# Patient Record
Sex: Female | Born: 1945 | Race: White | Hispanic: No | Marital: Married | Smoking: Current every day smoker
Health system: Southern US, Community
[De-identification: ages and names within clinical notes are randomized; demographics above are authoritative.]

## PROBLEM LIST (undated history)

## (undated) DIAGNOSIS — R569 Unspecified convulsions: Secondary | ICD-10-CM

## (undated) DIAGNOSIS — M797 Fibromyalgia: Secondary | ICD-10-CM

## (undated) DIAGNOSIS — E119 Type 2 diabetes mellitus without complications: Secondary | ICD-10-CM

## (undated) DIAGNOSIS — R413 Other amnesia: Secondary | ICD-10-CM

## (undated) DIAGNOSIS — F32A Depression, unspecified: Secondary | ICD-10-CM

## (undated) DIAGNOSIS — K861 Other chronic pancreatitis: Secondary | ICD-10-CM

## (undated) DIAGNOSIS — Z853 Personal history of malignant neoplasm of breast: Secondary | ICD-10-CM

## (undated) DIAGNOSIS — J449 Chronic obstructive pulmonary disease, unspecified: Secondary | ICD-10-CM

## (undated) DIAGNOSIS — R5382 Chronic fatigue, unspecified: Secondary | ICD-10-CM

## (undated) DIAGNOSIS — K219 Gastro-esophageal reflux disease without esophagitis: Secondary | ICD-10-CM

## (undated) DIAGNOSIS — E162 Hypoglycemia, unspecified: Secondary | ICD-10-CM

## (undated) DIAGNOSIS — G20 Parkinson's disease: Secondary | ICD-10-CM

## (undated) DIAGNOSIS — N189 Chronic kidney disease, unspecified: Secondary | ICD-10-CM

## (undated) DIAGNOSIS — E46 Unspecified protein-calorie malnutrition: Secondary | ICD-10-CM

## (undated) DIAGNOSIS — C801 Malignant (primary) neoplasm, unspecified: Secondary | ICD-10-CM

## (undated) DIAGNOSIS — F1120 Opioid dependence, uncomplicated: Secondary | ICD-10-CM

## (undated) DIAGNOSIS — S63259A Unspecified dislocation of unspecified finger, initial encounter: Secondary | ICD-10-CM

## (undated) DIAGNOSIS — G20A1 Parkinson's disease without dyskinesia, without mention of fluctuations: Secondary | ICD-10-CM

## (undated) DIAGNOSIS — K2270 Barrett's esophagus without dysplasia: Secondary | ICD-10-CM

## (undated) DIAGNOSIS — R269 Unspecified abnormalities of gait and mobility: Secondary | ICD-10-CM

## (undated) DIAGNOSIS — K5790 Diverticulosis of intestine, part unspecified, without perforation or abscess without bleeding: Secondary | ICD-10-CM

## (undated) DIAGNOSIS — D649 Anemia, unspecified: Secondary | ICD-10-CM

## (undated) DIAGNOSIS — G43909 Migraine, unspecified, not intractable, without status migrainosus: Secondary | ICD-10-CM

## (undated) DIAGNOSIS — F419 Anxiety disorder, unspecified: Secondary | ICD-10-CM

## (undated) DIAGNOSIS — S2220XA Unspecified fracture of sternum, initial encounter for closed fracture: Secondary | ICD-10-CM

## (undated) DIAGNOSIS — G8929 Other chronic pain: Secondary | ICD-10-CM

## (undated) DIAGNOSIS — F329 Major depressive disorder, single episode, unspecified: Secondary | ICD-10-CM

## (undated) DIAGNOSIS — I10 Essential (primary) hypertension: Secondary | ICD-10-CM

## (undated) DIAGNOSIS — R918 Other nonspecific abnormal finding of lung field: Secondary | ICD-10-CM

## (undated) DIAGNOSIS — K859 Acute pancreatitis without necrosis or infection, unspecified: Secondary | ICD-10-CM

## (undated) HISTORY — DX: Unspecified protein-calorie malnutrition: E46

## (undated) HISTORY — PX: APPENDECTOMY: SHX54

## (undated) HISTORY — DX: Diverticulosis of intestine, part unspecified, without perforation or abscess without bleeding: K57.90

## (undated) HISTORY — DX: Other amnesia: R41.3

## (undated) HISTORY — DX: Acute pancreatitis without necrosis or infection, unspecified: K85.90

## (undated) HISTORY — PX: COLONOSCOPY: SHX174

## (undated) HISTORY — DX: Chronic obstructive pulmonary disease, unspecified: J44.9

## (undated) HISTORY — DX: Chronic kidney disease, unspecified: N18.9

## (undated) HISTORY — DX: Parkinson's disease: G20

## (undated) HISTORY — PX: NASAL SINUS SURGERY: SHX719

## (undated) HISTORY — PX: CATARACT EXTRACTION: SUR2

## (undated) HISTORY — DX: Other nonspecific abnormal finding of lung field: R91.8

## (undated) HISTORY — DX: Other chronic pain: G89.29

## (undated) HISTORY — DX: Fibromyalgia: M79.7

## (undated) HISTORY — DX: Unspecified convulsions: R56.9

## (undated) HISTORY — DX: Depression, unspecified: F32.A

## (undated) HISTORY — PX: UPPER GASTROINTESTINAL ENDOSCOPY: SHX188

## (undated) HISTORY — DX: Gastro-esophageal reflux disease without esophagitis: K21.9

## (undated) HISTORY — PX: BOTOX INJECTION: SHX5754

## (undated) HISTORY — PX: EYE SURGERY: SHX253

## (undated) HISTORY — DX: Barrett's esophagus without dysplasia: K22.70

## (undated) HISTORY — DX: Unspecified abnormalities of gait and mobility: R26.9

## (undated) HISTORY — DX: Essential (primary) hypertension: I10

## (undated) HISTORY — DX: Anemia, unspecified: D64.9

## (undated) HISTORY — DX: Unspecified dislocation of unspecified finger, initial encounter: S63.259A

## (undated) HISTORY — DX: Anxiety disorder, unspecified: F41.9

## (undated) HISTORY — DX: Other chronic pancreatitis: K86.1

## (undated) HISTORY — PX: ABDOMINAL HYSTERECTOMY: SHX81

## (undated) HISTORY — DX: Parkinson's disease without dyskinesia, without mention of fluctuations: G20.A1

## (undated) HISTORY — DX: Migraine, unspecified, not intractable, without status migrainosus: G43.909

## (undated) HISTORY — DX: Major depressive disorder, single episode, unspecified: F32.9

## (undated) HISTORY — PX: CHOLECYSTECTOMY: SHX55

## (undated) HISTORY — DX: Chronic fatigue, unspecified: R53.82

## (undated) HISTORY — DX: Hypoglycemia, unspecified: E16.2

## (undated) HISTORY — DX: Personal history of malignant neoplasm of breast: Z85.3

## (undated) HISTORY — PX: PANCREATECTOMY: SHX1019

## (undated) HISTORY — PX: BREAST LUMPECTOMY: SHX2

## (undated) HISTORY — PX: CATARACT EXTRACTION, BILATERAL: SHX1313

## (undated) HISTORY — DX: Unspecified fracture of sternum, initial encounter for closed fracture: S22.20XA

## (undated) HISTORY — DX: Type 2 diabetes mellitus without complications: E11.9

---

## 1996-08-26 ENCOUNTER — Encounter (INDEPENDENT_AMBULATORY_CARE_PROVIDER_SITE_OTHER): Payer: Self-pay | Admitting: *Deleted

## 1997-04-15 ENCOUNTER — Ambulatory Visit: Admission: RE | Admit: 1997-04-15 | Discharge: 1997-04-15 | Payer: Self-pay | Admitting: Gastroenterology

## 1997-05-04 ENCOUNTER — Ambulatory Visit: Admission: RE | Admit: 1997-05-04 | Discharge: 1997-05-04 | Payer: Self-pay | Admitting: Gastroenterology

## 1997-07-21 ENCOUNTER — Ambulatory Visit: Admission: RE | Admit: 1997-07-21 | Discharge: 1997-07-21 | Payer: Self-pay | Admitting: Gastroenterology

## 1997-08-26 ENCOUNTER — Inpatient Hospital Stay (HOSPITAL_COMMUNITY): Admission: EM | Admit: 1997-08-26 | Discharge: 1997-08-31 | Payer: Self-pay | Admitting: Gastroenterology

## 1997-10-19 ENCOUNTER — Emergency Department (HOSPITAL_COMMUNITY): Admission: EM | Admit: 1997-10-19 | Discharge: 1997-10-19 | Payer: Self-pay | Admitting: Emergency Medicine

## 1999-03-02 ENCOUNTER — Ambulatory Visit (HOSPITAL_COMMUNITY): Admission: RE | Admit: 1999-03-02 | Discharge: 1999-03-02 | Payer: Self-pay | Admitting: Internal Medicine

## 1999-03-02 ENCOUNTER — Encounter: Payer: Self-pay | Admitting: Internal Medicine

## 1999-12-15 ENCOUNTER — Encounter: Payer: Self-pay | Admitting: Internal Medicine

## 1999-12-15 ENCOUNTER — Ambulatory Visit (HOSPITAL_COMMUNITY): Admission: RE | Admit: 1999-12-15 | Discharge: 1999-12-15 | Payer: Self-pay | Admitting: Internal Medicine

## 2002-03-19 ENCOUNTER — Ambulatory Visit (HOSPITAL_COMMUNITY): Admission: RE | Admit: 2002-03-19 | Discharge: 2002-03-19 | Payer: Self-pay | Admitting: Internal Medicine

## 2002-03-19 ENCOUNTER — Encounter: Payer: Self-pay | Admitting: Internal Medicine

## 2003-03-13 HISTORY — PX: LOBECTOMY: SHX5089

## 2003-04-12 ENCOUNTER — Ambulatory Visit (HOSPITAL_COMMUNITY): Admission: RE | Admit: 2003-04-12 | Discharge: 2003-04-12 | Payer: Self-pay | Admitting: Internal Medicine

## 2003-05-28 ENCOUNTER — Encounter: Admission: RE | Admit: 2003-05-28 | Discharge: 2003-05-28 | Payer: Self-pay | Admitting: Internal Medicine

## 2003-06-18 ENCOUNTER — Ambulatory Visit (HOSPITAL_COMMUNITY): Admission: RE | Admit: 2003-06-18 | Discharge: 2003-06-18 | Payer: Self-pay | Admitting: Thoracic Surgery

## 2003-06-29 ENCOUNTER — Encounter (INDEPENDENT_AMBULATORY_CARE_PROVIDER_SITE_OTHER): Payer: Self-pay | Admitting: *Deleted

## 2003-06-29 ENCOUNTER — Inpatient Hospital Stay (HOSPITAL_COMMUNITY): Admission: RE | Admit: 2003-06-29 | Discharge: 2003-07-04 | Payer: Self-pay | Admitting: Thoracic Surgery

## 2003-07-07 ENCOUNTER — Encounter: Admission: RE | Admit: 2003-07-07 | Discharge: 2003-07-07 | Payer: Self-pay | Admitting: Thoracic Surgery

## 2003-07-08 ENCOUNTER — Encounter: Admission: RE | Admit: 2003-07-08 | Discharge: 2003-07-08 | Payer: Self-pay | Admitting: Thoracic Surgery

## 2003-07-13 ENCOUNTER — Encounter: Admission: RE | Admit: 2003-07-13 | Discharge: 2003-07-13 | Payer: Self-pay | Admitting: Thoracic Surgery

## 2003-07-19 ENCOUNTER — Emergency Department (HOSPITAL_COMMUNITY): Admission: EM | Admit: 2003-07-19 | Discharge: 2003-07-19 | Payer: Self-pay | Admitting: Emergency Medicine

## 2003-07-22 ENCOUNTER — Encounter: Admission: RE | Admit: 2003-07-22 | Discharge: 2003-07-22 | Payer: Self-pay | Admitting: Thoracic Surgery

## 2003-07-27 ENCOUNTER — Encounter: Admission: RE | Admit: 2003-07-27 | Discharge: 2003-07-27 | Payer: Self-pay | Admitting: Thoracic Surgery

## 2003-07-28 ENCOUNTER — Encounter: Admission: RE | Admit: 2003-07-28 | Discharge: 2003-07-28 | Payer: Self-pay | Admitting: Thoracic Surgery

## 2003-07-29 ENCOUNTER — Inpatient Hospital Stay (HOSPITAL_COMMUNITY): Admission: AD | Admit: 2003-07-29 | Discharge: 2003-08-03 | Payer: Self-pay | Admitting: Thoracic Surgery

## 2003-08-11 ENCOUNTER — Encounter: Admission: RE | Admit: 2003-08-11 | Discharge: 2003-08-11 | Payer: Self-pay | Admitting: Thoracic Surgery

## 2003-08-18 ENCOUNTER — Encounter: Admission: RE | Admit: 2003-08-18 | Discharge: 2003-08-18 | Payer: Self-pay | Admitting: Thoracic Surgery

## 2003-09-29 ENCOUNTER — Encounter: Admission: RE | Admit: 2003-09-29 | Discharge: 2003-09-29 | Payer: Self-pay | Admitting: Thoracic Surgery

## 2003-10-13 ENCOUNTER — Ambulatory Visit (HOSPITAL_COMMUNITY): Admission: RE | Admit: 2003-10-13 | Discharge: 2003-10-13 | Payer: Self-pay | Admitting: Gastroenterology

## 2003-10-14 ENCOUNTER — Inpatient Hospital Stay (HOSPITAL_COMMUNITY): Admission: EM | Admit: 2003-10-14 | Discharge: 2003-10-20 | Payer: Self-pay | Admitting: Internal Medicine

## 2003-10-18 ENCOUNTER — Encounter (INDEPENDENT_AMBULATORY_CARE_PROVIDER_SITE_OTHER): Payer: Self-pay | Admitting: *Deleted

## 2003-10-18 ENCOUNTER — Encounter: Payer: Self-pay | Admitting: Gastroenterology

## 2003-10-27 ENCOUNTER — Encounter: Admission: RE | Admit: 2003-10-27 | Discharge: 2003-10-27 | Payer: Self-pay | Admitting: Thoracic Surgery

## 2003-11-03 ENCOUNTER — Encounter: Admission: RE | Admit: 2003-11-03 | Discharge: 2003-11-03 | Payer: Self-pay | Admitting: Thoracic Surgery

## 2003-11-10 ENCOUNTER — Encounter: Admission: RE | Admit: 2003-11-10 | Discharge: 2003-11-10 | Payer: Self-pay | Admitting: Thoracic Surgery

## 2003-11-30 ENCOUNTER — Emergency Department (HOSPITAL_COMMUNITY): Admission: EM | Admit: 2003-11-30 | Discharge: 2003-11-30 | Payer: Self-pay | Admitting: Emergency Medicine

## 2003-12-02 ENCOUNTER — Observation Stay (HOSPITAL_COMMUNITY): Admission: RE | Admit: 2003-12-02 | Discharge: 2003-12-03 | Payer: Self-pay | Admitting: General Surgery

## 2003-12-02 ENCOUNTER — Encounter (INDEPENDENT_AMBULATORY_CARE_PROVIDER_SITE_OTHER): Payer: Self-pay | Admitting: Specialist

## 2003-12-08 ENCOUNTER — Encounter: Admission: RE | Admit: 2003-12-08 | Discharge: 2003-12-08 | Payer: Self-pay | Admitting: Thoracic Surgery

## 2004-01-19 ENCOUNTER — Encounter: Admission: RE | Admit: 2004-01-19 | Discharge: 2004-01-19 | Payer: Self-pay | Admitting: Thoracic Surgery

## 2004-02-08 ENCOUNTER — Ambulatory Visit: Payer: Self-pay | Admitting: Internal Medicine

## 2004-03-07 ENCOUNTER — Ambulatory Visit: Payer: Self-pay | Admitting: Internal Medicine

## 2004-03-09 ENCOUNTER — Ambulatory Visit: Payer: Self-pay | Admitting: Internal Medicine

## 2004-04-04 ENCOUNTER — Ambulatory Visit: Payer: Self-pay | Admitting: Internal Medicine

## 2004-04-26 ENCOUNTER — Encounter: Admission: RE | Admit: 2004-04-26 | Discharge: 2004-04-26 | Payer: Self-pay | Admitting: Thoracic Surgery

## 2004-04-26 ENCOUNTER — Ambulatory Visit: Payer: Self-pay | Admitting: Internal Medicine

## 2004-05-02 ENCOUNTER — Ambulatory Visit: Payer: Self-pay | Admitting: Internal Medicine

## 2004-05-29 ENCOUNTER — Ambulatory Visit: Payer: Self-pay | Admitting: Internal Medicine

## 2004-06-13 ENCOUNTER — Ambulatory Visit: Payer: Self-pay | Admitting: Internal Medicine

## 2004-06-26 ENCOUNTER — Ambulatory Visit: Payer: Self-pay | Admitting: Internal Medicine

## 2004-07-11 ENCOUNTER — Ambulatory Visit: Payer: Self-pay | Admitting: Gastroenterology

## 2004-07-17 ENCOUNTER — Ambulatory Visit: Payer: Self-pay | Admitting: Internal Medicine

## 2004-07-20 ENCOUNTER — Ambulatory Visit: Payer: Self-pay | Admitting: Internal Medicine

## 2004-07-24 ENCOUNTER — Ambulatory Visit: Payer: Self-pay | Admitting: Internal Medicine

## 2004-07-27 ENCOUNTER — Ambulatory Visit: Payer: Self-pay | Admitting: Gastroenterology

## 2004-07-27 ENCOUNTER — Encounter (INDEPENDENT_AMBULATORY_CARE_PROVIDER_SITE_OTHER): Payer: Self-pay | Admitting: *Deleted

## 2004-07-27 LAB — HM COLONOSCOPY

## 2004-08-14 ENCOUNTER — Ambulatory Visit: Payer: Self-pay | Admitting: Internal Medicine

## 2004-09-13 ENCOUNTER — Ambulatory Visit: Payer: Self-pay | Admitting: Internal Medicine

## 2004-10-03 ENCOUNTER — Ambulatory Visit: Payer: Self-pay | Admitting: Internal Medicine

## 2004-10-31 ENCOUNTER — Ambulatory Visit: Payer: Self-pay | Admitting: Internal Medicine

## 2004-12-01 ENCOUNTER — Ambulatory Visit: Payer: Self-pay | Admitting: Internal Medicine

## 2004-12-29 ENCOUNTER — Ambulatory Visit: Payer: Self-pay | Admitting: Internal Medicine

## 2005-01-18 ENCOUNTER — Ambulatory Visit: Payer: Self-pay | Admitting: Internal Medicine

## 2005-01-29 ENCOUNTER — Ambulatory Visit: Payer: Self-pay | Admitting: Internal Medicine

## 2005-02-28 ENCOUNTER — Ambulatory Visit: Payer: Self-pay | Admitting: Internal Medicine

## 2005-04-02 ENCOUNTER — Ambulatory Visit: Payer: Self-pay | Admitting: Internal Medicine

## 2005-04-11 ENCOUNTER — Encounter (INDEPENDENT_AMBULATORY_CARE_PROVIDER_SITE_OTHER): Payer: Self-pay | Admitting: *Deleted

## 2005-04-11 ENCOUNTER — Encounter: Admission: RE | Admit: 2005-04-11 | Discharge: 2005-04-11 | Payer: Self-pay | Admitting: Obstetrics & Gynecology

## 2005-04-11 ENCOUNTER — Encounter (INDEPENDENT_AMBULATORY_CARE_PROVIDER_SITE_OTHER): Payer: Self-pay | Admitting: Radiology

## 2005-04-17 ENCOUNTER — Ambulatory Visit: Payer: Self-pay | Admitting: Internal Medicine

## 2005-04-17 ENCOUNTER — Ambulatory Visit: Payer: Self-pay | Admitting: Oncology

## 2005-04-22 ENCOUNTER — Encounter: Admission: RE | Admit: 2005-04-22 | Discharge: 2005-04-22 | Payer: Self-pay | Admitting: General Surgery

## 2005-04-30 ENCOUNTER — Encounter: Admission: RE | Admit: 2005-04-30 | Discharge: 2005-04-30 | Payer: Self-pay | Admitting: General Surgery

## 2005-04-30 ENCOUNTER — Ambulatory Visit: Admission: RE | Admit: 2005-04-30 | Discharge: 2005-05-25 | Payer: Self-pay | Admitting: Radiation Oncology

## 2005-05-03 ENCOUNTER — Ambulatory Visit: Payer: Self-pay | Admitting: Internal Medicine

## 2005-05-04 ENCOUNTER — Encounter (INDEPENDENT_AMBULATORY_CARE_PROVIDER_SITE_OTHER): Payer: Self-pay | Admitting: *Deleted

## 2005-05-04 ENCOUNTER — Encounter: Admission: RE | Admit: 2005-05-04 | Discharge: 2005-05-04 | Payer: Self-pay | Admitting: General Surgery

## 2005-05-09 ENCOUNTER — Ambulatory Visit (HOSPITAL_BASED_OUTPATIENT_CLINIC_OR_DEPARTMENT_OTHER): Admission: RE | Admit: 2005-05-09 | Discharge: 2005-05-09 | Payer: Self-pay | Admitting: General Surgery

## 2005-05-09 ENCOUNTER — Encounter: Admission: RE | Admit: 2005-05-09 | Discharge: 2005-05-09 | Payer: Self-pay | Admitting: General Surgery

## 2005-05-09 ENCOUNTER — Encounter (INDEPENDENT_AMBULATORY_CARE_PROVIDER_SITE_OTHER): Payer: Self-pay | Admitting: Specialist

## 2005-05-31 ENCOUNTER — Ambulatory Visit: Payer: Self-pay | Admitting: Internal Medicine

## 2005-06-06 ENCOUNTER — Ambulatory Visit: Payer: Self-pay | Admitting: Oncology

## 2005-06-12 ENCOUNTER — Ambulatory Visit (HOSPITAL_COMMUNITY): Admission: RE | Admit: 2005-06-12 | Discharge: 2005-06-12 | Payer: Self-pay | Admitting: Oncology

## 2005-06-15 ENCOUNTER — Ambulatory Visit: Admission: RE | Admit: 2005-06-15 | Discharge: 2005-09-05 | Payer: Self-pay | Admitting: Radiation Oncology

## 2005-06-18 ENCOUNTER — Encounter: Admission: RE | Admit: 2005-06-18 | Discharge: 2005-06-18 | Payer: Self-pay | Admitting: Oncology

## 2005-07-02 ENCOUNTER — Ambulatory Visit: Payer: Self-pay | Admitting: Internal Medicine

## 2005-07-04 ENCOUNTER — Ambulatory Visit: Payer: Self-pay

## 2005-07-23 ENCOUNTER — Ambulatory Visit: Payer: Self-pay | Admitting: Oncology

## 2005-08-01 ENCOUNTER — Ambulatory Visit: Payer: Self-pay | Admitting: Internal Medicine

## 2005-08-28 ENCOUNTER — Ambulatory Visit: Payer: Self-pay | Admitting: Internal Medicine

## 2005-09-27 ENCOUNTER — Ambulatory Visit: Payer: Self-pay | Admitting: Internal Medicine

## 2005-10-16 ENCOUNTER — Ambulatory Visit: Payer: Self-pay | Admitting: Internal Medicine

## 2005-10-16 ENCOUNTER — Ambulatory Visit (HOSPITAL_COMMUNITY): Admission: RE | Admit: 2005-10-16 | Discharge: 2005-10-16 | Payer: Self-pay | Admitting: Internal Medicine

## 2005-10-23 ENCOUNTER — Ambulatory Visit: Payer: Self-pay | Admitting: Internal Medicine

## 2005-11-28 ENCOUNTER — Ambulatory Visit: Payer: Self-pay | Admitting: Internal Medicine

## 2005-12-10 DIAGNOSIS — Z853 Personal history of malignant neoplasm of breast: Secondary | ICD-10-CM

## 2005-12-31 ENCOUNTER — Ambulatory Visit: Payer: Self-pay | Admitting: Internal Medicine

## 2006-01-14 ENCOUNTER — Ambulatory Visit: Payer: Self-pay | Admitting: Internal Medicine

## 2006-01-15 ENCOUNTER — Ambulatory Visit: Payer: Self-pay | Admitting: Oncology

## 2006-01-29 ENCOUNTER — Ambulatory Visit: Payer: Self-pay | Admitting: Internal Medicine

## 2006-02-05 LAB — CBC WITH DIFFERENTIAL/PLATELET
Basophils Absolute: 0 10*3/uL (ref 0.0–0.1)
Eosinophils Absolute: 0.1 10*3/uL (ref 0.0–0.5)
HCT: 32.2 % — ABNORMAL LOW (ref 34.8–46.6)
HGB: 10.6 g/dL — ABNORMAL LOW (ref 11.6–15.9)
MCV: 84.9 fL (ref 81.0–101.0)
MONO%: 5.7 % (ref 0.0–13.0)
NEUT#: 6.6 10*3/uL — ABNORMAL HIGH (ref 1.5–6.5)
NEUT%: 76.7 % (ref 39.6–76.8)
RDW: 18.2 % — ABNORMAL HIGH (ref 11.3–14.5)
lymph#: 1.4 10*3/uL (ref 0.9–3.3)

## 2006-02-05 LAB — COMPREHENSIVE METABOLIC PANEL
Albumin: 3.8 g/dL (ref 3.5–5.2)
BUN: 8 mg/dL (ref 6–23)
Calcium: 9.3 mg/dL (ref 8.4–10.5)
Chloride: 102 mEq/L (ref 96–112)
Glucose, Bld: 150 mg/dL — ABNORMAL HIGH (ref 70–99)
Potassium: 4.5 mEq/L (ref 3.5–5.3)

## 2006-02-26 ENCOUNTER — Ambulatory Visit: Payer: Self-pay | Admitting: Internal Medicine

## 2006-03-22 ENCOUNTER — Ambulatory Visit: Payer: Self-pay | Admitting: Internal Medicine

## 2006-04-04 ENCOUNTER — Ambulatory Visit: Payer: Self-pay | Admitting: Oncology

## 2006-04-05 ENCOUNTER — Ambulatory Visit: Payer: Self-pay | Admitting: Internal Medicine

## 2006-04-08 LAB — FOLLICLE STIMULATING HORMONE: FSH: 59.7 m[IU]/mL

## 2006-04-19 ENCOUNTER — Ambulatory Visit: Payer: Self-pay | Admitting: Internal Medicine

## 2006-04-22 LAB — ESTRADIOL, ULTRA SENS: Estradiol, Ultra Sensitive: 5 pg/mL

## 2006-04-26 ENCOUNTER — Encounter: Admission: RE | Admit: 2006-04-26 | Discharge: 2006-04-26 | Payer: Self-pay | Admitting: Oncology

## 2006-05-07 ENCOUNTER — Ambulatory Visit: Payer: Self-pay | Admitting: Internal Medicine

## 2006-05-20 ENCOUNTER — Ambulatory Visit: Payer: Self-pay | Admitting: Internal Medicine

## 2006-05-20 LAB — CONVERTED CEMR LAB
AST: 45 units/L — ABNORMAL HIGH (ref 0–37)
Albumin: 3.3 g/dL — ABNORMAL LOW (ref 3.5–5.2)
Alkaline Phosphatase: 88 units/L (ref 39–117)
BUN: 8 mg/dL (ref 6–23)
Basophils Absolute: 0.1 10*3/uL (ref 0.0–0.1)
Basophils Absolute: 0.1 10*3/uL (ref 0.0–0.1)
Bilirubin Urine: NEGATIVE
Bilirubin Urine: NEGATIVE
Bilirubin, Direct: 0.1 mg/dL (ref 0.0–0.3)
CO2: 27 meq/L (ref 19–32)
Chloride: 103 meq/L (ref 96–112)
Cholesterol: 197 mg/dL (ref 0–200)
Cholesterol: 197 mg/dL (ref 0–200)
Creatinine, Ser: 0.7 mg/dL (ref 0.4–1.2)
Creatinine, Ser: 0.7 mg/dL (ref 0.4–1.2)
Crystals: NEGATIVE
Eosinophils Absolute: 0.1 10*3/uL (ref 0.0–0.6)
GFR calc Af Amer: 110 mL/min
GFR calc non Af Amer: 91 mL/min
Glucose, Bld: 143 mg/dL — ABNORMAL HIGH (ref 70–99)
HCT: 33.5 % — ABNORMAL LOW (ref 36.0–46.0)
HCT: 33.5 % — ABNORMAL LOW (ref 36.0–46.0)
HDL: 59.9 mg/dL (ref >39.0)
HDL: 59.9 mg/dL (ref >39.0)
Hemoglobin, Urine: NEGATIVE
Hemoglobin, Urine: NEGATIVE
Hemoglobin: 11.3 g/dL — ABNORMAL LOW (ref 12.0–15.0)
Hemoglobin: 11.3 g/dL — ABNORMAL LOW (ref 12.0–15.0)
Hgb A1c MFr Bld: 7.6 % — ABNORMAL HIGH (ref 4.6–6.0)
Hgb A1c MFr Bld: 7.6 % — ABNORMAL HIGH (ref 4.6–6.0)
Ketones, ur: NEGATIVE mg/dL
LDL Cholesterol: 99 mg/dL (ref 0–99)
LDL Cholesterol: 99 mg/dL (ref 0–99)
Lymphocytes Relative: 18 % (ref 12.0–46.0)
MCHC: 33.7 g/dL (ref 30.0–36.0)
MCHC: 33.7 g/dL (ref 30.0–36.0)
MCV: 83.6 fL (ref 78.0–100.0)
Monocytes Absolute: 0.4 10*3/uL (ref 0.2–0.7)
Monocytes Absolute: 0.4 10*3/uL (ref 0.2–0.7)
Monocytes Relative: 5.2 % (ref 3.0–11.0)
Monocytes Relative: 5.2 % (ref 3.0–11.0)
Neutro Abs: 5 10*3/uL (ref 1.4–7.7)
Neutro Abs: 5 10*3/uL (ref 1.4–7.7)
Neutrophils Relative %: 75 % (ref 43.0–77.0)
Neutrophils Relative %: 75 % (ref 43.0–77.0)
Nitrite: NEGATIVE
Potassium: 3.7 meq/L (ref 3.5–5.1)
Potassium: 3.7 meq/L (ref 3.5–5.1)
RDW: 20.3 % — ABNORMAL HIGH (ref 11.5–14.6)
RDW: 20.3 % — ABNORMAL HIGH (ref 11.5–14.6)
Sed Rate: 47 mm/hr — ABNORMAL HIGH (ref 0–25)
Sodium: 140 meq/L (ref 135–145)
Sodium: 140 meq/L (ref 135–145)
TSH: 1.5 microintl units/mL (ref 0.35–5.50)
Total Bilirubin: 0.4 mg/dL (ref 0.3–1.2)
Total Protein, Urine: NEGATIVE mg/dL
Total Protein: 7.1 g/dL (ref 6.0–8.3)
Triglycerides: 192 mg/dL — ABNORMAL HIGH (ref 0–149)
Urine Glucose: NEGATIVE mg/dL
Urobilinogen, UA: 0.2 (ref 0.0–1.0)
VLDL: 38 mg/dL (ref 0–40)
Vit D, 1,25-Dihydroxy: 17 — ABNORMAL LOW (ref 20–57)
Vitamin B-12: 441 pg/mL (ref 211–911)
pH: 7.5 (ref 5.0–8.0)

## 2006-05-21 ENCOUNTER — Ambulatory Visit: Payer: Self-pay | Admitting: Internal Medicine

## 2006-06-25 ENCOUNTER — Ambulatory Visit: Payer: Self-pay | Admitting: Internal Medicine

## 2006-07-17 ENCOUNTER — Ambulatory Visit: Payer: Self-pay | Admitting: Internal Medicine

## 2006-07-17 LAB — CONVERTED CEMR LAB
CO2: 28 meq/L (ref 19–32)
Chloride: 106 meq/L (ref 96–112)
Creatinine, Ser: 0.7 mg/dL (ref 0.4–1.2)
Glucose, Bld: 151 mg/dL — ABNORMAL HIGH (ref 70–99)
Hgb A1c MFr Bld: 7.5 % — ABNORMAL HIGH (ref 4.6–6.0)
Sodium: 139 meq/L (ref 135–145)

## 2006-07-18 ENCOUNTER — Ambulatory Visit: Payer: Self-pay | Admitting: Oncology

## 2006-07-22 ENCOUNTER — Ambulatory Visit: Payer: Self-pay | Admitting: Internal Medicine

## 2006-07-22 LAB — COMPREHENSIVE METABOLIC PANEL
AST: 22 U/L (ref 0–37)
Albumin: 3.8 g/dL (ref 3.5–5.2)
Alkaline Phosphatase: 59 U/L (ref 39–117)
BUN: 13 mg/dL (ref 6–23)
Potassium: 4.1 mEq/L (ref 3.5–5.3)
Sodium: 139 mEq/L (ref 135–145)
Total Protein: 6.5 g/dL (ref 6.0–8.3)

## 2006-07-22 LAB — CBC WITH DIFFERENTIAL/PLATELET
BASO%: 0.4 % (ref 0.0–2.0)
EOS%: 1 % (ref 0.0–7.0)
MCH: 28.3 pg (ref 26.0–34.0)
MCHC: 33.6 g/dL (ref 32.0–36.0)
RBC: 3.97 10*6/uL (ref 3.70–5.32)
RDW: 17.3 % — ABNORMAL HIGH (ref 11.3–14.5)
lymph#: 1.5 10*3/uL (ref 0.9–3.3)

## 2006-08-07 ENCOUNTER — Encounter: Payer: Self-pay | Admitting: Endocrinology

## 2006-08-07 DIAGNOSIS — F329 Major depressive disorder, single episode, unspecified: Secondary | ICD-10-CM

## 2006-08-07 DIAGNOSIS — I10 Essential (primary) hypertension: Secondary | ICD-10-CM

## 2006-08-07 DIAGNOSIS — E109 Type 1 diabetes mellitus without complications: Secondary | ICD-10-CM

## 2006-08-22 ENCOUNTER — Ambulatory Visit: Payer: Self-pay | Admitting: Internal Medicine

## 2006-09-23 ENCOUNTER — Ambulatory Visit: Payer: Self-pay | Admitting: Internal Medicine

## 2006-10-01 ENCOUNTER — Ambulatory Visit: Payer: Self-pay | Admitting: Internal Medicine

## 2006-10-01 LAB — CONVERTED CEMR LAB
ALT: 37 units/L — ABNORMAL HIGH (ref 0–35)
Alkaline Phosphatase: 54 units/L (ref 39–117)
Basophils Relative: 0.3 % (ref 0.0–1.0)
Eosinophils Relative: 0.9 % (ref 0.0–5.0)
Lymphocytes Relative: 16.4 % (ref 12.0–46.0)
Neutro Abs: 5.5 10*3/uL (ref 1.4–7.7)
Nitrite: NEGATIVE
Platelets: 210 10*3/uL (ref 150–400)
RBC: 4.23 M/uL (ref 3.87–5.11)
Specific Gravity, Urine: 1.01 (ref 1.000–1.03)
Total Bilirubin: 0.5 mg/dL (ref 0.3–1.2)
Total Protein: 6.8 g/dL (ref 6.0–8.3)
Urine Glucose: NEGATIVE mg/dL
Urobilinogen, UA: 1 (ref 0.0–1.0)
WBC: 7.3 10*3/uL (ref 4.5–10.5)

## 2006-10-14 ENCOUNTER — Ambulatory Visit: Payer: Self-pay | Admitting: Internal Medicine

## 2006-10-14 LAB — CONVERTED CEMR LAB
Alkaline Phosphatase: 89 units/L (ref 39–117)
BUN: 11 mg/dL (ref 6–23)
Basophils Absolute: 0.1 10*3/uL (ref 0.0–0.1)
Basophils Relative: 0.9 % (ref 0.0–1.0)
Bilirubin Urine: NEGATIVE
CO2: 30 meq/L (ref 19–32)
GFR calc Af Amer: 82 mL/min
Hemoglobin, Urine: NEGATIVE
Hemoglobin: 10.9 g/dL — ABNORMAL LOW (ref 12.0–15.0)
Leukocytes, UA: NEGATIVE
Lymphocytes Relative: 16.5 % (ref 12.0–46.0)
MCHC: 34 g/dL (ref 30.0–36.0)
Monocytes Absolute: 0.3 10*3/uL (ref 0.2–0.7)
Monocytes Relative: 4.3 % (ref 3.0–11.0)
Neutro Abs: 4.4 10*3/uL (ref 1.4–7.7)
Potassium: 4.2 meq/L (ref 3.5–5.1)
Sodium: 139 meq/L (ref 135–145)
TSH: 1.49 microintl units/mL (ref 0.35–5.50)
Total Protein: 6.1 g/dL (ref 6.0–8.3)

## 2006-11-07 ENCOUNTER — Ambulatory Visit: Payer: Self-pay | Admitting: Internal Medicine

## 2006-12-03 ENCOUNTER — Ambulatory Visit: Payer: Self-pay | Admitting: Internal Medicine

## 2006-12-03 LAB — CONVERTED CEMR LAB
ALT: 22 units/L (ref 0–35)
AST: 21 units/L (ref 0–37)
Albumin: 3.4 g/dL — ABNORMAL LOW (ref 3.5–5.2)
Alkaline Phosphatase: 68 units/L (ref 39–117)
BUN: 8 mg/dL (ref 6–23)
Basophils Relative: 0.7 % (ref 0.0–1.0)
CO2: 30 meq/L (ref 19–32)
Calcium: 9.3 mg/dL (ref 8.4–10.5)
Chloride: 103 meq/L (ref 96–112)
Creatinine, Ser: 0.8 mg/dL (ref 0.4–1.2)
Hemoglobin: 11 g/dL — ABNORMAL LOW (ref 12.0–15.0)
INR: 0.8 (ref 0.8–1.0)
Monocytes Absolute: 0.5 10*3/uL (ref 0.2–0.7)
Monocytes Relative: 7.6 % (ref 3.0–11.0)
Platelets: 204 10*3/uL (ref 150–400)
Prothrombin Time: 10.6 s — ABNORMAL LOW (ref 10.9–13.3)
RBC: 3.79 M/uL — ABNORMAL LOW (ref 3.87–5.11)
RDW: 19.1 % — ABNORMAL HIGH (ref 11.5–14.6)
Total Bilirubin: 0.3 mg/dL (ref 0.3–1.2)
Total Protein: 6.5 g/dL (ref 6.0–8.3)

## 2006-12-11 ENCOUNTER — Ambulatory Visit: Payer: Self-pay | Admitting: Internal Medicine

## 2006-12-11 DIAGNOSIS — G43819 Other migraine, intractable, without status migrainosus: Secondary | ICD-10-CM

## 2006-12-11 DIAGNOSIS — E559 Vitamin D deficiency, unspecified: Secondary | ICD-10-CM | POA: Insufficient documentation

## 2006-12-11 DIAGNOSIS — K861 Other chronic pancreatitis: Secondary | ICD-10-CM

## 2006-12-11 DIAGNOSIS — R5381 Other malaise: Secondary | ICD-10-CM | POA: Insufficient documentation

## 2006-12-11 DIAGNOSIS — R5383 Other fatigue: Secondary | ICD-10-CM

## 2006-12-11 DIAGNOSIS — IMO0001 Reserved for inherently not codable concepts without codable children: Secondary | ICD-10-CM

## 2006-12-11 DIAGNOSIS — R55 Syncope and collapse: Secondary | ICD-10-CM

## 2006-12-11 DIAGNOSIS — E278 Other specified disorders of adrenal gland: Secondary | ICD-10-CM | POA: Insufficient documentation

## 2006-12-13 ENCOUNTER — Encounter: Payer: Self-pay | Admitting: Internal Medicine

## 2007-01-02 ENCOUNTER — Encounter: Payer: Self-pay | Admitting: Internal Medicine

## 2007-01-02 ENCOUNTER — Ambulatory Visit: Payer: Self-pay | Admitting: Internal Medicine

## 2007-01-15 ENCOUNTER — Ambulatory Visit: Payer: Self-pay | Admitting: Oncology

## 2007-01-20 LAB — CBC WITH DIFFERENTIAL/PLATELET
Basophils Absolute: 0 10*3/uL (ref 0.0–0.1)
EOS%: 1.2 % (ref 0.0–7.0)
HGB: 11 g/dL — ABNORMAL LOW (ref 11.6–15.9)
LYMPH%: 26.9 % (ref 14.0–48.0)
MCH: 30.2 pg (ref 26.0–34.0)
MCV: 87.8 fL (ref 81.0–101.0)
MONO%: 6.6 % (ref 0.0–13.0)
NEUT%: 64.7 % (ref 39.6–76.8)
RDW: 16.9 % — ABNORMAL HIGH (ref 11.3–14.5)

## 2007-01-20 LAB — COMPREHENSIVE METABOLIC PANEL
Alkaline Phosphatase: 69 U/L (ref 39–117)
Creatinine, Ser: 0.83 mg/dL (ref 0.40–1.20)
Glucose, Bld: 116 mg/dL — ABNORMAL HIGH (ref 70–99)
Sodium: 141 mEq/L (ref 135–145)
Total Bilirubin: 0.2 mg/dL — ABNORMAL LOW (ref 0.3–1.2)
Total Protein: 6.3 g/dL (ref 6.0–8.3)

## 2007-01-27 ENCOUNTER — Encounter: Payer: Self-pay | Admitting: Internal Medicine

## 2007-02-04 ENCOUNTER — Ambulatory Visit: Payer: Self-pay | Admitting: Internal Medicine

## 2007-02-19 ENCOUNTER — Ambulatory Visit: Payer: Self-pay | Admitting: Internal Medicine

## 2007-03-07 ENCOUNTER — Ambulatory Visit: Payer: Self-pay | Admitting: Internal Medicine

## 2007-03-13 DIAGNOSIS — S63259A Unspecified dislocation of unspecified finger, initial encounter: Secondary | ICD-10-CM

## 2007-03-13 HISTORY — DX: Unspecified dislocation of unspecified finger, initial encounter: S63.259A

## 2007-04-11 ENCOUNTER — Ambulatory Visit: Payer: Self-pay | Admitting: Internal Medicine

## 2007-04-11 DIAGNOSIS — J019 Acute sinusitis, unspecified: Secondary | ICD-10-CM | POA: Insufficient documentation

## 2007-04-28 ENCOUNTER — Encounter: Admission: RE | Admit: 2007-04-28 | Discharge: 2007-04-28 | Payer: Self-pay | Admitting: Oncology

## 2007-05-06 ENCOUNTER — Ambulatory Visit: Payer: Self-pay | Admitting: Internal Medicine

## 2007-05-06 DIAGNOSIS — R519 Headache, unspecified: Secondary | ICD-10-CM | POA: Insufficient documentation

## 2007-05-06 DIAGNOSIS — T887XXA Unspecified adverse effect of drug or medicament, initial encounter: Secondary | ICD-10-CM

## 2007-05-06 DIAGNOSIS — R51 Headache: Secondary | ICD-10-CM

## 2007-05-09 ENCOUNTER — Ambulatory Visit: Payer: Self-pay | Admitting: Internal Medicine

## 2007-05-09 ENCOUNTER — Encounter: Payer: Self-pay | Admitting: Internal Medicine

## 2007-05-09 DIAGNOSIS — F411 Generalized anxiety disorder: Secondary | ICD-10-CM | POA: Insufficient documentation

## 2007-05-09 DIAGNOSIS — J449 Chronic obstructive pulmonary disease, unspecified: Secondary | ICD-10-CM | POA: Insufficient documentation

## 2007-05-09 DIAGNOSIS — D649 Anemia, unspecified: Secondary | ICD-10-CM | POA: Insufficient documentation

## 2007-05-09 DIAGNOSIS — J4489 Other specified chronic obstructive pulmonary disease: Secondary | ICD-10-CM | POA: Insufficient documentation

## 2007-05-09 DIAGNOSIS — K219 Gastro-esophageal reflux disease without esophagitis: Secondary | ICD-10-CM | POA: Insufficient documentation

## 2007-05-14 LAB — CONVERTED CEMR LAB
ALT: 27 units/L (ref 0–35)
Basophils Relative: 0.6 % (ref 0.0–1.0)
Bilirubin Urine: NEGATIVE
Bilirubin, Direct: 0.1 mg/dL (ref 0.0–0.3)
CO2: 29 meq/L (ref 19–32)
Calcium: 9.7 mg/dL (ref 8.4–10.5)
Eosinophils Absolute: 0.1 10*3/uL (ref 0.0–0.6)
Eosinophils Relative: 1.5 % (ref 0.0–5.0)
GFR calc Af Amer: 82 mL/min
GFR calc non Af Amer: 68 mL/min
Glucose, Bld: 124 mg/dL — ABNORMAL HIGH (ref 70–99)
Hemoglobin: 11.7 g/dL — ABNORMAL LOW (ref 12.0–15.0)
Hgb A1c MFr Bld: 7.3 % — ABNORMAL HIGH (ref 4.6–6.0)
Leukocytes, UA: NEGATIVE
Lymphocytes Relative: 23.1 % (ref 12.0–46.0)
MCV: 84.5 fL (ref 78.0–100.0)
Monocytes Absolute: 0.4 10*3/uL (ref 0.2–0.7)
Monocytes Relative: 6 % (ref 3.0–11.0)
Neutro Abs: 4.9 10*3/uL (ref 1.4–7.7)
Platelets: 193 10*3/uL (ref 150–400)
Potassium: 3.5 meq/L (ref 3.5–5.1)
TSH: 1.47 microintl units/mL (ref 0.35–5.50)
Total Protein: 7.1 g/dL (ref 6.0–8.3)
Urine Glucose: NEGATIVE mg/dL

## 2007-05-23 ENCOUNTER — Encounter: Payer: Self-pay | Admitting: Internal Medicine

## 2007-06-02 ENCOUNTER — Ambulatory Visit: Payer: Self-pay | Admitting: Internal Medicine

## 2007-06-06 ENCOUNTER — Encounter: Payer: Self-pay | Admitting: Internal Medicine

## 2007-07-02 ENCOUNTER — Ambulatory Visit: Payer: Self-pay | Admitting: Internal Medicine

## 2007-07-16 ENCOUNTER — Ambulatory Visit: Payer: Self-pay | Admitting: Internal Medicine

## 2007-07-16 DIAGNOSIS — M79609 Pain in unspecified limb: Secondary | ICD-10-CM

## 2007-07-17 ENCOUNTER — Ambulatory Visit: Payer: Self-pay | Admitting: Oncology

## 2007-07-18 ENCOUNTER — Encounter: Payer: Self-pay | Admitting: Internal Medicine

## 2007-07-21 ENCOUNTER — Telehealth: Payer: Self-pay | Admitting: Internal Medicine

## 2007-07-28 ENCOUNTER — Encounter: Payer: Self-pay | Admitting: Internal Medicine

## 2007-07-30 ENCOUNTER — Ambulatory Visit: Payer: Self-pay | Admitting: Internal Medicine

## 2007-08-05 ENCOUNTER — Encounter: Payer: Self-pay | Admitting: Internal Medicine

## 2007-08-07 ENCOUNTER — Encounter: Payer: Self-pay | Admitting: Internal Medicine

## 2007-08-21 ENCOUNTER — Encounter: Payer: Self-pay | Admitting: Internal Medicine

## 2007-08-22 ENCOUNTER — Ambulatory Visit: Payer: Self-pay | Admitting: Internal Medicine

## 2007-09-09 ENCOUNTER — Encounter: Payer: Self-pay | Admitting: Internal Medicine

## 2007-09-25 ENCOUNTER — Ambulatory Visit: Payer: Self-pay | Admitting: Internal Medicine

## 2007-09-25 DIAGNOSIS — F17200 Nicotine dependence, unspecified, uncomplicated: Secondary | ICD-10-CM | POA: Insufficient documentation

## 2007-10-09 ENCOUNTER — Encounter: Payer: Self-pay | Admitting: Internal Medicine

## 2007-10-10 ENCOUNTER — Encounter: Payer: Self-pay | Admitting: Internal Medicine

## 2007-10-10 ENCOUNTER — Telehealth: Payer: Self-pay | Admitting: Internal Medicine

## 2007-10-10 ENCOUNTER — Telehealth (INDEPENDENT_AMBULATORY_CARE_PROVIDER_SITE_OTHER): Payer: Self-pay | Admitting: *Deleted

## 2007-10-22 ENCOUNTER — Ambulatory Visit: Payer: Self-pay | Admitting: Internal Medicine

## 2007-10-24 ENCOUNTER — Ambulatory Visit: Payer: Self-pay | Admitting: Internal Medicine

## 2007-10-27 LAB — CONVERTED CEMR LAB
AST: 24 units/L (ref 0–37)
Albumin: 3.4 g/dL — ABNORMAL LOW (ref 3.5–5.2)
Alkaline Phosphatase: 64 units/L (ref 39–117)
BUN: 25 mg/dL — ABNORMAL HIGH (ref 6–23)
Bilirubin, Direct: 0.1 mg/dL (ref 0.0–0.3)
Chloride: 108 meq/L (ref 96–112)
Cholesterol: 216 mg/dL (ref 0–200)
Eosinophils Absolute: 0.1 10*3/uL (ref 0.0–0.7)
Eosinophils Relative: 1.3 % (ref 0.0–5.0)
GFR calc non Af Amer: 54 mL/min
Glucose, Bld: 171 mg/dL — ABNORMAL HIGH (ref 70–99)
Hemoglobin, Urine: NEGATIVE
Monocytes Relative: 6.4 % (ref 3.0–12.0)
Neutrophils Relative %: 68.1 % (ref 43.0–77.0)
Nitrite: NEGATIVE
Platelets: 182 10*3/uL (ref 150–400)
Potassium: 4.8 meq/L (ref 3.5–5.1)
Sodium: 142 meq/L (ref 135–145)
TSH: 2.14 microintl units/mL (ref 0.35–5.50)
Triglycerides: 192 mg/dL — ABNORMAL HIGH (ref 0–149)
Urobilinogen, UA: 0.2 (ref 0.0–1.0)
WBC: 7.5 10*3/uL (ref 4.5–10.5)

## 2007-10-28 ENCOUNTER — Telehealth: Payer: Self-pay | Admitting: Internal Medicine

## 2007-11-24 ENCOUNTER — Ambulatory Visit: Payer: Self-pay | Admitting: Internal Medicine

## 2007-12-04 ENCOUNTER — Encounter: Payer: Self-pay | Admitting: Internal Medicine

## 2007-12-22 ENCOUNTER — Ambulatory Visit: Payer: Self-pay | Admitting: Internal Medicine

## 2008-01-16 ENCOUNTER — Ambulatory Visit: Payer: Self-pay | Admitting: Oncology

## 2008-01-22 ENCOUNTER — Ambulatory Visit: Payer: Self-pay | Admitting: Internal Medicine

## 2008-01-22 DIAGNOSIS — B370 Candidal stomatitis: Secondary | ICD-10-CM

## 2008-02-24 ENCOUNTER — Ambulatory Visit: Payer: Self-pay | Admitting: Internal Medicine

## 2008-03-11 ENCOUNTER — Ambulatory Visit: Payer: Self-pay | Admitting: Oncology

## 2008-03-15 ENCOUNTER — Telehealth: Payer: Self-pay | Admitting: Internal Medicine

## 2008-03-16 ENCOUNTER — Encounter: Payer: Self-pay | Admitting: Internal Medicine

## 2008-03-16 LAB — CBC WITH DIFFERENTIAL/PLATELET
BASO%: 0.5 % (ref 0.0–2.0)
EOS%: 1.1 % (ref 0.0–7.0)
HCT: 36.6 % (ref 34.8–46.6)
LYMPH%: 30.8 % (ref 14.0–48.0)
MCH: 30.1 pg (ref 26.0–34.0)
MCHC: 33.6 g/dL (ref 32.0–36.0)
MCV: 89.6 fL (ref 81.0–101.0)
MONO%: 7.5 % (ref 0.0–13.0)
NEUT%: 60.1 % (ref 39.6–76.8)
Platelets: 200 10*3/uL (ref 145–400)
RBC: 4.09 10*6/uL (ref 3.70–5.32)

## 2008-03-16 LAB — COMPREHENSIVE METABOLIC PANEL
ALT: 33 U/L (ref 0–35)
Alkaline Phosphatase: 82 U/L (ref 39–117)
CO2: 26 mEq/L (ref 19–32)
Creatinine, Ser: 0.83 mg/dL (ref 0.40–1.20)
Total Bilirubin: 0.5 mg/dL (ref 0.3–1.2)

## 2008-03-17 LAB — CANCER ANTIGEN 27.29: CA 27.29: 18 U/mL (ref 0–39)

## 2008-03-22 ENCOUNTER — Ambulatory Visit: Payer: Self-pay | Admitting: Internal Medicine

## 2008-03-23 LAB — CONVERTED CEMR LAB
ALT: 26 units/L (ref 0–35)
AST: 23 units/L (ref 0–37)
Basophils Absolute: 0 10*3/uL (ref 0.0–0.1)
Bilirubin, Direct: 0.1 mg/dL (ref 0.0–0.3)
CO2: 27 meq/L (ref 19–32)
Chloride: 107 meq/L (ref 96–112)
Cholesterol: 218 mg/dL (ref 0–200)
Creatinine,U: 107.3 mg/dL
Direct LDL: 116.4 mg/dL
Eosinophils Absolute: 0.4 10*3/uL (ref 0.0–0.7)
GFR calc non Af Amer: 91 mL/min
HDL: 58.5 mg/dL (ref >39.0)
Hgb A1c MFr Bld: 9.2 % — ABNORMAL HIGH (ref 4.6–6.0)
Lymphocytes Relative: 45.6 % (ref 12.0–46.0)
MCHC: 34.2 g/dL (ref 30.0–36.0)
MCV: 89.8 fL (ref 78.0–100.0)
Neutrophils Relative %: 41.2 % — ABNORMAL LOW (ref 43.0–77.0)
Platelets: 198 10*3/uL (ref 150–400)
Potassium: 3.3 meq/L — ABNORMAL LOW (ref 3.5–5.1)
RBC: 4.04 M/uL (ref 3.87–5.11)
RDW: 15.6 % — ABNORMAL HIGH (ref 11.5–14.6)
Sodium: 141 meq/L (ref 135–145)
TSH: 4.76 microintl units/mL (ref 0.35–5.50)
Total Bilirubin: 0.4 mg/dL (ref 0.3–1.2)
Triglycerides: 313 mg/dL (ref 0–149)
VLDL: 63 mg/dL — ABNORMAL HIGH (ref 0–40)

## 2008-03-24 ENCOUNTER — Ambulatory Visit: Payer: Self-pay | Admitting: Internal Medicine

## 2008-04-13 ENCOUNTER — Ambulatory Visit: Payer: Self-pay | Admitting: Internal Medicine

## 2008-04-13 DIAGNOSIS — R071 Chest pain on breathing: Secondary | ICD-10-CM

## 2008-04-13 DIAGNOSIS — E876 Hypokalemia: Secondary | ICD-10-CM

## 2008-04-19 ENCOUNTER — Ambulatory Visit: Payer: Self-pay | Admitting: Internal Medicine

## 2008-04-19 DIAGNOSIS — M810 Age-related osteoporosis without current pathological fracture: Secondary | ICD-10-CM | POA: Insufficient documentation

## 2008-04-19 DIAGNOSIS — S2239XA Fracture of one rib, unspecified side, initial encounter for closed fracture: Secondary | ICD-10-CM | POA: Insufficient documentation

## 2008-04-20 ENCOUNTER — Encounter: Payer: Self-pay | Admitting: Internal Medicine

## 2008-04-22 ENCOUNTER — Encounter (INDEPENDENT_AMBULATORY_CARE_PROVIDER_SITE_OTHER): Payer: Self-pay | Admitting: *Deleted

## 2008-05-03 ENCOUNTER — Ambulatory Visit: Payer: Self-pay | Admitting: Internal Medicine

## 2008-05-05 LAB — CONVERTED CEMR LAB
BUN: 21 mg/dL (ref 6–23)
Chloride: 104 meq/L (ref 96–112)
Creatinine, Ser: 0.7 mg/dL (ref 0.4–1.2)
GFR calc Af Amer: 110 mL/min
GFR calc non Af Amer: 91 mL/min
Hgb A1c MFr Bld: 9 % — ABNORMAL HIGH (ref 4.6–6.0)
Potassium: 4.2 meq/L (ref 3.5–5.1)

## 2008-05-25 ENCOUNTER — Ambulatory Visit: Payer: Self-pay | Admitting: Internal Medicine

## 2008-05-25 DIAGNOSIS — L97509 Non-pressure chronic ulcer of other part of unspecified foot with unspecified severity: Secondary | ICD-10-CM | POA: Insufficient documentation

## 2008-05-25 LAB — CONVERTED CEMR LAB
BUN: 15 mg/dL (ref 6–23)
Calcium: 9.2 mg/dL (ref 8.4–10.5)
GFR calc non Af Amer: 77.49 mL/min (ref >60)
Glucose, Bld: 349 mg/dL — ABNORMAL HIGH (ref 70–99)
Phosphorus: 2.5 mg/dL (ref 2.3–4.6)
Potassium: 4.4 meq/L (ref 3.5–5.1)
Sodium: 140 meq/L (ref 135–145)

## 2008-05-28 ENCOUNTER — Telehealth (INDEPENDENT_AMBULATORY_CARE_PROVIDER_SITE_OTHER): Payer: Self-pay | Admitting: *Deleted

## 2008-05-31 ENCOUNTER — Encounter: Payer: Self-pay | Admitting: Internal Medicine

## 2008-06-02 ENCOUNTER — Ambulatory Visit: Payer: Self-pay | Admitting: Internal Medicine

## 2008-06-03 ENCOUNTER — Encounter: Payer: Self-pay | Admitting: Internal Medicine

## 2008-06-15 ENCOUNTER — Encounter (HOSPITAL_BASED_OUTPATIENT_CLINIC_OR_DEPARTMENT_OTHER): Admission: RE | Admit: 2008-06-15 | Discharge: 2008-07-13 | Payer: Self-pay | Admitting: Internal Medicine

## 2008-06-16 ENCOUNTER — Encounter: Payer: Self-pay | Admitting: Internal Medicine

## 2008-06-17 ENCOUNTER — Encounter: Payer: Self-pay | Admitting: Internal Medicine

## 2008-06-22 ENCOUNTER — Ambulatory Visit: Payer: Self-pay | Admitting: Internal Medicine

## 2008-07-07 ENCOUNTER — Telehealth (INDEPENDENT_AMBULATORY_CARE_PROVIDER_SITE_OTHER): Payer: Self-pay | Admitting: *Deleted

## 2008-07-16 ENCOUNTER — Ambulatory Visit: Payer: Self-pay | Admitting: Oncology

## 2008-07-22 ENCOUNTER — Ambulatory Visit: Payer: Self-pay | Admitting: Internal Medicine

## 2008-07-22 LAB — CONVERTED CEMR LAB
Albumin: 3.5 g/dL (ref 3.5–5.2)
BUN: 18 mg/dL (ref 6–23)
Calcium: 9.2 mg/dL (ref 8.4–10.5)
GFR calc non Af Amer: 77.45 mL/min (ref >60)
Glucose, Bld: 109 mg/dL — ABNORMAL HIGH (ref 70–99)
Hgb A1c MFr Bld: 8.7 % — ABNORMAL HIGH (ref 4.6–6.5)
Potassium: 3.8 meq/L (ref 3.5–5.1)
Total Bilirubin: 0.3 mg/dL (ref 0.3–1.2)
Vit D, 25-Hydroxy: 36 ng/mL (ref 30–89)

## 2008-07-23 ENCOUNTER — Ambulatory Visit: Payer: Self-pay | Admitting: Internal Medicine

## 2008-07-23 DIAGNOSIS — R404 Transient alteration of awareness: Secondary | ICD-10-CM | POA: Insufficient documentation

## 2008-08-05 ENCOUNTER — Encounter: Payer: Self-pay | Admitting: Internal Medicine

## 2008-08-05 ENCOUNTER — Telehealth (INDEPENDENT_AMBULATORY_CARE_PROVIDER_SITE_OTHER): Payer: Self-pay | Admitting: *Deleted

## 2008-08-18 ENCOUNTER — Encounter: Payer: Self-pay | Admitting: Internal Medicine

## 2008-08-18 LAB — CBC WITH DIFFERENTIAL/PLATELET
Eosinophils Absolute: 0.1 10*3/uL (ref 0.0–0.5)
HCT: 33.5 % — ABNORMAL LOW (ref 34.8–46.6)
LYMPH%: 25.8 % (ref 14.0–49.7)
MCV: 90 fL (ref 79.5–101.0)
MONO#: 0.3 10*3/uL (ref 0.1–0.9)
MONO%: 5.8 % (ref 0.0–14.0)
NEUT#: 3.7 10*3/uL (ref 1.5–6.5)
NEUT%: 66.4 % (ref 38.4–76.8)
Platelets: 156 10*3/uL (ref 145–400)
WBC: 5.6 10*3/uL (ref 3.9–10.3)

## 2008-08-20 ENCOUNTER — Encounter: Admission: RE | Admit: 2008-08-20 | Discharge: 2008-08-20 | Payer: Self-pay | Admitting: Oncology

## 2008-08-23 ENCOUNTER — Ambulatory Visit: Payer: Self-pay | Admitting: Internal Medicine

## 2008-09-17 ENCOUNTER — Encounter: Payer: Self-pay | Admitting: Internal Medicine

## 2008-09-20 ENCOUNTER — Telehealth: Payer: Self-pay | Admitting: Internal Medicine

## 2008-09-20 ENCOUNTER — Ambulatory Visit: Payer: Self-pay | Admitting: Internal Medicine

## 2008-10-21 ENCOUNTER — Ambulatory Visit: Payer: Self-pay | Admitting: Internal Medicine

## 2008-10-21 LAB — CONVERTED CEMR LAB
ALT: 24 units/L (ref 0–35)
AST: 27 units/L (ref 0–37)
Albumin: 3.6 g/dL (ref 3.5–5.2)
Alkaline Phosphatase: 80 units/L (ref 39–117)
Basophils Absolute: 0 10*3/uL (ref 0.0–0.1)
Bilirubin Urine: NEGATIVE
Calcium: 9.1 mg/dL (ref 8.4–10.5)
Eosinophils Relative: 1.4 % (ref 0.0–5.0)
GFR calc non Af Amer: 67.55 mL/min (ref >60)
Glucose, Bld: 222 mg/dL — ABNORMAL HIGH (ref 70–99)
HCT: 36.3 % (ref 36.0–46.0)
Hemoglobin: 12.1 g/dL (ref 12.0–15.0)
Hgb A1c MFr Bld: 9 % — ABNORMAL HIGH (ref 4.6–6.5)
Ketones, ur: NEGATIVE mg/dL
Lymphocytes Relative: 29.9 % (ref 12.0–46.0)
Monocytes Relative: 6.8 % (ref 3.0–12.0)
Neutro Abs: 3.4 10*3/uL (ref 1.4–7.7)
Potassium: 3.8 meq/L (ref 3.5–5.1)
RBC: 3.89 M/uL (ref 3.87–5.11)
RDW: 14.3 % (ref 11.5–14.6)
Sodium: 139 meq/L (ref 135–145)
Specific Gravity, Urine: 1.02 (ref 1.000–1.030)
Urine Glucose: NEGATIVE mg/dL
Urobilinogen, UA: 0.2 (ref 0.0–1.0)
Vit D, 25-Hydroxy: 35 ng/mL (ref 30–89)
WBC: 5.6 10*3/uL (ref 4.5–10.5)
pH: 6.5 (ref 5.0–8.0)

## 2008-11-10 DIAGNOSIS — S2220XA Unspecified fracture of sternum, initial encounter for closed fracture: Secondary | ICD-10-CM

## 2008-11-10 HISTORY — DX: Unspecified fracture of sternum, initial encounter for closed fracture: S22.20XA

## 2008-11-22 ENCOUNTER — Ambulatory Visit: Payer: Self-pay | Admitting: Internal Medicine

## 2008-11-23 ENCOUNTER — Telehealth: Payer: Self-pay | Admitting: Internal Medicine

## 2008-12-02 ENCOUNTER — Ambulatory Visit: Payer: Self-pay | Admitting: Internal Medicine

## 2008-12-02 ENCOUNTER — Telehealth: Payer: Self-pay | Admitting: Internal Medicine

## 2008-12-02 DIAGNOSIS — S2220XA Unspecified fracture of sternum, initial encounter for closed fracture: Secondary | ICD-10-CM

## 2008-12-08 ENCOUNTER — Encounter: Payer: Self-pay | Admitting: Internal Medicine

## 2008-12-10 ENCOUNTER — Ambulatory Visit: Payer: Self-pay | Admitting: Internal Medicine

## 2008-12-22 ENCOUNTER — Ambulatory Visit: Payer: Self-pay | Admitting: Internal Medicine

## 2008-12-30 ENCOUNTER — Telehealth: Payer: Self-pay | Admitting: Internal Medicine

## 2009-01-18 ENCOUNTER — Ambulatory Visit: Payer: Self-pay | Admitting: Internal Medicine

## 2009-01-20 LAB — CONVERTED CEMR LAB
ALT: 22 units/L (ref 0–35)
Alkaline Phosphatase: 64 units/L (ref 39–117)
Bilirubin Urine: NEGATIVE
Bilirubin, Direct: 0 mg/dL (ref 0.0–0.3)
CO2: 29 meq/L (ref 19–32)
Chloride: 106 meq/L (ref 96–112)
Ketones, ur: NEGATIVE mg/dL
Sodium: 143 meq/L (ref 135–145)
Total Protein: 6.4 g/dL (ref 6.0–8.3)
Urine Glucose: NEGATIVE mg/dL
pH: 6.5 (ref 5.0–8.0)

## 2009-01-21 ENCOUNTER — Ambulatory Visit: Payer: Self-pay | Admitting: Internal Medicine

## 2009-01-28 ENCOUNTER — Ambulatory Visit: Payer: Self-pay | Admitting: Oncology

## 2009-01-31 ENCOUNTER — Telehealth: Payer: Self-pay | Admitting: Internal Medicine

## 2009-02-01 LAB — COMPREHENSIVE METABOLIC PANEL
AST: 22 U/L (ref 0–37)
Albumin: 3.8 g/dL (ref 3.5–5.2)
Alkaline Phosphatase: 65 U/L (ref 39–117)
BUN: 19 mg/dL (ref 6–23)
Glucose, Bld: 238 mg/dL — ABNORMAL HIGH (ref 70–99)
Potassium: 4.2 mEq/L (ref 3.5–5.3)
Sodium: 141 mEq/L (ref 135–145)
Total Bilirubin: 0.2 mg/dL — ABNORMAL LOW (ref 0.3–1.2)
Total Protein: 5.9 g/dL — ABNORMAL LOW (ref 6.0–8.3)

## 2009-02-01 LAB — CBC WITH DIFFERENTIAL/PLATELET
EOS%: 2.6 % (ref 0.0–7.0)
Eosinophils Absolute: 0.1 10*3/uL (ref 0.0–0.5)
LYMPH%: 36.7 % (ref 14.0–49.7)
MCH: 31 pg (ref 25.1–34.0)
MCV: 91.6 fL (ref 79.5–101.0)
MONO%: 8.8 % (ref 0.0–14.0)
Platelets: 158 10*3/uL (ref 145–400)
RBC: 3.63 10*6/uL — ABNORMAL LOW (ref 3.70–5.45)
RDW: 15.5 % — ABNORMAL HIGH (ref 11.2–14.5)

## 2009-02-14 ENCOUNTER — Ambulatory Visit: Payer: Self-pay | Admitting: Internal Medicine

## 2009-02-14 ENCOUNTER — Encounter: Payer: Self-pay | Admitting: Internal Medicine

## 2009-02-14 DIAGNOSIS — S2249XA Multiple fractures of ribs, unspecified side, initial encounter for closed fracture: Secondary | ICD-10-CM | POA: Insufficient documentation

## 2009-02-14 DIAGNOSIS — R079 Chest pain, unspecified: Secondary | ICD-10-CM

## 2009-02-14 DIAGNOSIS — S270XXA Traumatic pneumothorax, initial encounter: Secondary | ICD-10-CM | POA: Insufficient documentation

## 2009-02-15 ENCOUNTER — Telehealth: Payer: Self-pay | Admitting: Internal Medicine

## 2009-02-15 ENCOUNTER — Telehealth (INDEPENDENT_AMBULATORY_CARE_PROVIDER_SITE_OTHER): Payer: Self-pay | Admitting: *Deleted

## 2009-02-16 ENCOUNTER — Ambulatory Visit: Payer: Self-pay | Admitting: Internal Medicine

## 2009-02-16 DIAGNOSIS — S271XXA Traumatic hemothorax, initial encounter: Secondary | ICD-10-CM | POA: Insufficient documentation

## 2009-02-23 ENCOUNTER — Encounter: Admission: RE | Admit: 2009-02-23 | Discharge: 2009-02-23 | Payer: Self-pay | Admitting: Thoracic Surgery

## 2009-02-23 ENCOUNTER — Ambulatory Visit: Payer: Self-pay | Admitting: Thoracic Surgery

## 2009-02-28 ENCOUNTER — Telehealth: Payer: Self-pay | Admitting: Internal Medicine

## 2009-03-02 ENCOUNTER — Encounter: Admission: RE | Admit: 2009-03-02 | Discharge: 2009-03-02 | Payer: Self-pay | Admitting: Thoracic Surgery

## 2009-03-02 ENCOUNTER — Ambulatory Visit: Payer: Self-pay | Admitting: Thoracic Surgery

## 2009-03-14 ENCOUNTER — Ambulatory Visit: Payer: Self-pay | Admitting: Internal Medicine

## 2009-03-30 ENCOUNTER — Encounter: Admission: RE | Admit: 2009-03-30 | Discharge: 2009-03-30 | Payer: Self-pay | Admitting: Thoracic Surgery

## 2009-03-30 ENCOUNTER — Ambulatory Visit: Payer: Self-pay | Admitting: Thoracic Surgery

## 2009-04-06 ENCOUNTER — Telehealth: Payer: Self-pay | Admitting: Internal Medicine

## 2009-04-12 ENCOUNTER — Ambulatory Visit: Payer: Self-pay | Admitting: Internal Medicine

## 2009-04-14 ENCOUNTER — Telehealth: Payer: Self-pay | Admitting: Internal Medicine

## 2009-04-15 ENCOUNTER — Ambulatory Visit: Payer: Self-pay | Admitting: Oncology

## 2009-04-17 ENCOUNTER — Encounter: Payer: Self-pay | Admitting: Internal Medicine

## 2009-04-18 ENCOUNTER — Encounter: Payer: Self-pay | Admitting: Internal Medicine

## 2009-04-19 ENCOUNTER — Telehealth: Payer: Self-pay | Admitting: Internal Medicine

## 2009-04-19 LAB — CBC WITH DIFFERENTIAL/PLATELET
BASO%: 0.5 % (ref 0.0–2.0)
LYMPH%: 25.9 % (ref 14.0–49.7)
MCHC: 33.2 g/dL (ref 31.5–36.0)
MCV: 91.1 fL (ref 79.5–101.0)
MONO%: 4.4 % (ref 0.0–14.0)
Platelets: 147 10*3/uL (ref 145–400)
RBC: 3.98 10*6/uL (ref 3.70–5.45)
RDW: 16.6 % — ABNORMAL HIGH (ref 11.2–14.5)
WBC: 6.2 10*3/uL (ref 3.9–10.3)

## 2009-04-19 LAB — COMPREHENSIVE METABOLIC PANEL
ALT: 15 U/L (ref 0–35)
AST: 17 U/L (ref 0–37)
Alkaline Phosphatase: 80 U/L (ref 39–117)
Potassium: 4 mEq/L (ref 3.5–5.3)
Sodium: 140 mEq/L (ref 135–145)
Total Bilirubin: 0.2 mg/dL — ABNORMAL LOW (ref 0.3–1.2)
Total Protein: 6.3 g/dL (ref 6.0–8.3)

## 2009-04-26 ENCOUNTER — Encounter: Payer: Self-pay | Admitting: Internal Medicine

## 2009-05-05 ENCOUNTER — Ambulatory Visit: Payer: Self-pay | Admitting: Internal Medicine

## 2009-05-06 ENCOUNTER — Telehealth: Payer: Self-pay | Admitting: Internal Medicine

## 2009-05-06 LAB — CONVERTED CEMR LAB
AST: 32 units/L (ref 0–37)
BUN: 17 mg/dL (ref 6–23)
GFR calc non Af Amer: 67.43 mL/min (ref >60)
Hgb A1c MFr Bld: 8.3 % — ABNORMAL HIGH (ref 4.6–6.5)
Potassium: 4.7 meq/L (ref 3.5–5.1)
Sodium: 140 meq/L (ref 135–145)
Total Bilirubin: 0.3 mg/dL (ref 0.3–1.2)

## 2009-05-09 ENCOUNTER — Encounter: Payer: Self-pay | Admitting: Internal Medicine

## 2009-05-10 ENCOUNTER — Ambulatory Visit: Payer: Self-pay | Admitting: Internal Medicine

## 2009-06-10 ENCOUNTER — Ambulatory Visit: Payer: Self-pay | Admitting: Internal Medicine

## 2009-07-06 ENCOUNTER — Ambulatory Visit: Payer: Self-pay | Admitting: Internal Medicine

## 2009-07-07 ENCOUNTER — Telehealth: Payer: Self-pay | Admitting: Internal Medicine

## 2009-08-02 ENCOUNTER — Ambulatory Visit: Payer: Self-pay | Admitting: Internal Medicine

## 2009-08-03 LAB — CONVERTED CEMR LAB
BUN: 18 mg/dL (ref 6–23)
Chloride: 105 meq/L (ref 96–112)
Hgb A1c MFr Bld: 7.9 % — ABNORMAL HIGH (ref 4.6–6.5)
Potassium: 4.2 meq/L (ref 3.5–5.1)

## 2009-08-10 ENCOUNTER — Ambulatory Visit: Payer: Self-pay | Admitting: Internal Medicine

## 2009-08-22 ENCOUNTER — Encounter: Admission: RE | Admit: 2009-08-22 | Discharge: 2009-08-22 | Payer: Self-pay | Admitting: Oncology

## 2009-08-29 ENCOUNTER — Ambulatory Visit: Payer: Self-pay | Admitting: Internal Medicine

## 2009-08-29 DIAGNOSIS — L905 Scar conditions and fibrosis of skin: Secondary | ICD-10-CM | POA: Insufficient documentation

## 2009-09-05 ENCOUNTER — Telehealth: Payer: Self-pay | Admitting: Internal Medicine

## 2009-09-22 ENCOUNTER — Telehealth: Payer: Self-pay | Admitting: Internal Medicine

## 2009-09-26 ENCOUNTER — Ambulatory Visit: Payer: Self-pay | Admitting: Internal Medicine

## 2009-09-28 ENCOUNTER — Encounter: Payer: Self-pay | Admitting: Internal Medicine

## 2009-09-29 ENCOUNTER — Telehealth (INDEPENDENT_AMBULATORY_CARE_PROVIDER_SITE_OTHER): Payer: Self-pay | Admitting: *Deleted

## 2009-10-12 ENCOUNTER — Telehealth (INDEPENDENT_AMBULATORY_CARE_PROVIDER_SITE_OTHER): Payer: Self-pay | Admitting: *Deleted

## 2009-10-13 ENCOUNTER — Ambulatory Visit: Payer: Self-pay | Admitting: Oncology

## 2009-10-17 ENCOUNTER — Encounter: Payer: Self-pay | Admitting: Internal Medicine

## 2009-10-17 ENCOUNTER — Telehealth: Payer: Self-pay | Admitting: Internal Medicine

## 2009-10-17 LAB — CBC WITH DIFFERENTIAL/PLATELET
BASO%: 0.4 % (ref 0.0–2.0)
EOS%: 1.7 % (ref 0.0–7.0)
MCH: 27.4 pg (ref 25.1–34.0)
MCHC: 33 g/dL (ref 31.5–36.0)
MCV: 83.2 fL (ref 79.5–101.0)
MONO%: 5.1 % (ref 0.0–14.0)
NEUT%: 68.1 % (ref 38.4–76.8)
RDW: 17 % — ABNORMAL HIGH (ref 11.2–14.5)
lymph#: 1.5 10*3/uL (ref 0.9–3.3)

## 2009-10-18 ENCOUNTER — Encounter: Payer: Self-pay | Admitting: Internal Medicine

## 2009-10-18 LAB — COMPREHENSIVE METABOLIC PANEL
AST: 38 U/L — ABNORMAL HIGH (ref 0–37)
Albumin: 4 g/dL (ref 3.5–5.2)
Alkaline Phosphatase: 79 U/L (ref 39–117)
Calcium: 9.7 mg/dL (ref 8.4–10.5)
Chloride: 106 mEq/L (ref 96–112)
Glucose, Bld: 167 mg/dL — ABNORMAL HIGH (ref 70–99)
Potassium: 3.8 mEq/L (ref 3.5–5.3)
Sodium: 141 mEq/L (ref 135–145)
Total Protein: 6.3 g/dL (ref 6.0–8.3)

## 2009-10-18 LAB — CONVERTED CEMR LAB: Hgb A1c MFr Bld: 8.2 % — ABNORMAL HIGH (ref <5.7)

## 2009-10-24 ENCOUNTER — Encounter: Payer: Self-pay | Admitting: Internal Medicine

## 2009-10-25 ENCOUNTER — Ambulatory Visit: Payer: Self-pay | Admitting: Internal Medicine

## 2009-10-25 LAB — CONVERTED CEMR LAB: Blood Glucose, Fingerstick: 280

## 2009-10-26 ENCOUNTER — Telehealth (INDEPENDENT_AMBULATORY_CARE_PROVIDER_SITE_OTHER): Payer: Self-pay | Admitting: *Deleted

## 2009-10-27 ENCOUNTER — Telehealth (INDEPENDENT_AMBULATORY_CARE_PROVIDER_SITE_OTHER): Payer: Self-pay | Admitting: *Deleted

## 2009-10-28 ENCOUNTER — Encounter: Payer: Self-pay | Admitting: Internal Medicine

## 2009-11-04 ENCOUNTER — Telehealth: Payer: Self-pay | Admitting: Internal Medicine

## 2009-11-07 ENCOUNTER — Telehealth: Payer: Self-pay | Admitting: Internal Medicine

## 2009-11-09 ENCOUNTER — Telehealth: Payer: Self-pay | Admitting: Internal Medicine

## 2009-11-29 ENCOUNTER — Ambulatory Visit: Payer: Self-pay | Admitting: Internal Medicine

## 2009-11-29 ENCOUNTER — Telehealth: Payer: Self-pay | Admitting: Internal Medicine

## 2009-11-29 DIAGNOSIS — F29 Unspecified psychosis not due to a substance or known physiological condition: Secondary | ICD-10-CM | POA: Insufficient documentation

## 2009-12-06 ENCOUNTER — Telehealth: Payer: Self-pay | Admitting: Internal Medicine

## 2009-12-13 ENCOUNTER — Telehealth: Payer: Self-pay | Admitting: Internal Medicine

## 2009-12-14 ENCOUNTER — Telehealth: Payer: Self-pay | Admitting: Internal Medicine

## 2009-12-20 ENCOUNTER — Telehealth (INDEPENDENT_AMBULATORY_CARE_PROVIDER_SITE_OTHER): Payer: Self-pay | Admitting: *Deleted

## 2009-12-21 ENCOUNTER — Ambulatory Visit: Payer: Self-pay | Admitting: Internal Medicine

## 2009-12-22 LAB — CONVERTED CEMR LAB
ALT: 15 units/L (ref 0–35)
AST: 20 units/L (ref 0–37)
Albumin: 3.4 g/dL — ABNORMAL LOW (ref 3.5–5.2)
Alkaline Phosphatase: 66 units/L (ref 39–117)
Basophils Relative: 0.3 % (ref 0.0–3.0)
Eosinophils Relative: 3.6 % (ref 0.0–5.0)
GFR calc non Af Amer: 57.59 mL/min (ref >60)
Glucose, Bld: 175 mg/dL — ABNORMAL HIGH (ref 70–99)
HCT: 32.3 % — ABNORMAL LOW (ref 36.0–46.0)
Hemoglobin: 11.1 g/dL — ABNORMAL LOW (ref 12.0–15.0)
Hgb A1c MFr Bld: 8.3 % — ABNORMAL HIGH (ref 4.6–6.5)
Lymphs Abs: 1.8 10*3/uL (ref 0.7–4.0)
Monocytes Relative: 4.9 % (ref 3.0–12.0)
Neutro Abs: 3.4 10*3/uL (ref 1.4–7.7)
Potassium: 3.8 meq/L (ref 3.5–5.1)
RBC: 3.71 M/uL — ABNORMAL LOW (ref 3.87–5.11)
Sodium: 142 meq/L (ref 135–145)
WBC: 5.8 10*3/uL (ref 4.5–10.5)

## 2009-12-23 ENCOUNTER — Ambulatory Visit: Payer: Self-pay | Admitting: Internal Medicine

## 2010-01-02 ENCOUNTER — Ambulatory Visit: Payer: Self-pay | Admitting: Professional

## 2010-01-12 ENCOUNTER — Ambulatory Visit: Payer: Self-pay | Admitting: Professional

## 2010-01-23 ENCOUNTER — Ambulatory Visit: Payer: Self-pay | Admitting: Professional

## 2010-01-24 ENCOUNTER — Ambulatory Visit: Payer: Self-pay | Admitting: Internal Medicine

## 2010-01-26 ENCOUNTER — Telehealth: Payer: Self-pay | Admitting: Internal Medicine

## 2010-01-27 ENCOUNTER — Telehealth: Payer: Self-pay | Admitting: Internal Medicine

## 2010-01-30 ENCOUNTER — Ambulatory Visit: Payer: Self-pay | Admitting: Professional

## 2010-02-13 ENCOUNTER — Ambulatory Visit: Payer: Self-pay | Admitting: Internal Medicine

## 2010-02-13 ENCOUNTER — Telehealth: Payer: Self-pay | Admitting: Internal Medicine

## 2010-02-13 ENCOUNTER — Ambulatory Visit: Payer: Self-pay | Admitting: Professional

## 2010-02-13 DIAGNOSIS — R197 Diarrhea, unspecified: Secondary | ICD-10-CM

## 2010-02-20 ENCOUNTER — Ambulatory Visit: Payer: Self-pay | Admitting: Professional

## 2010-02-24 ENCOUNTER — Ambulatory Visit: Payer: Self-pay | Admitting: Internal Medicine

## 2010-02-27 ENCOUNTER — Ambulatory Visit: Payer: Self-pay | Admitting: Professional

## 2010-03-09 ENCOUNTER — Telehealth: Payer: Self-pay | Admitting: Internal Medicine

## 2010-03-16 ENCOUNTER — Ambulatory Visit
Admission: RE | Admit: 2010-03-16 | Discharge: 2010-03-16 | Payer: Self-pay | Source: Home / Self Care | Attending: Professional | Admitting: Professional

## 2010-03-22 ENCOUNTER — Ambulatory Visit: Admit: 2010-03-22 | Payer: Self-pay | Admitting: Internal Medicine

## 2010-03-23 ENCOUNTER — Ambulatory Visit
Admission: RE | Admit: 2010-03-23 | Discharge: 2010-03-23 | Payer: Self-pay | Source: Home / Self Care | Attending: Professional | Admitting: Professional

## 2010-03-23 ENCOUNTER — Telehealth: Payer: Self-pay | Admitting: Internal Medicine

## 2010-03-29 ENCOUNTER — Ambulatory Visit
Admission: RE | Admit: 2010-03-29 | Discharge: 2010-03-29 | Payer: Self-pay | Source: Home / Self Care | Attending: Internal Medicine | Admitting: Internal Medicine

## 2010-03-30 ENCOUNTER — Ambulatory Visit
Admission: RE | Admit: 2010-03-30 | Discharge: 2010-03-30 | Payer: Self-pay | Source: Home / Self Care | Attending: Professional | Admitting: Professional

## 2010-04-02 ENCOUNTER — Encounter: Payer: Self-pay | Admitting: Oncology

## 2010-04-02 ENCOUNTER — Encounter: Payer: Self-pay | Admitting: Thoracic Surgery

## 2010-04-06 ENCOUNTER — Ambulatory Visit: Admit: 2010-04-06 | Payer: Self-pay | Admitting: Professional

## 2010-04-11 NOTE — Progress Notes (Signed)
Summary: MIGRAINE MEDS  Phone Note From Other Clinic   Caller: Clydie Braun - Case manager [phone] Summary of Call: Spoke w/pt's insurance case manager. She is sending over info regarding migraine medciations to my attention.   Initial call taken by: Lamar Sprinkles, CMA,  October 17, 2009 11:07 AM  Follow-up for Phone Call        Reviewed Fax from insurance.  COVERED MEDICATION (NO PA): Generic imitrex - tablet, injection & nasal spray Naratriptan - generic only - tablet Eletriptan - (Relpax tablets) Rizatriptan - (Maxalt, tabs and MLT) Dihydroergotamine nasal spray - Migranal nasal spray  MEDS BELOW REQUIRE PA: Axert Frova Amerge Imitrex Sumavel Treximet Alsuma Zomig  Please advise, What alternative would you like to prescribe for patient. ZOMIG was denied. Patient has never tried preferred meds per her report. I have max allowed amount of each.   Follow-up by: Lamar Sprinkles, CMA,  October 18, 2009 11:32 AM  Additional Follow-up for Phone Call Additional follow up Details #1::        Pls ask pt - has any of the above worked for her before? Additional Follow-up by: [name] MD,  October 18, 2009 1:26 PM    Additional Follow-up for Phone Call Additional follow up Details #2::    RN at insurance has discussed these options w/patient and patient says she has only tried zomig, no others.............Marland KitchenLamar Sprinkles, CMA  October 18, 2009 1:35 PM   Additional Follow-up for Phone Call Additional follow up Details #3:: Details for Additional Follow-up Action Taken: Try Maxalt MLT 10 mg - same# and instructions   Pt informed of rx, also informed RN at insurance of change........Marland KitchenLamar Sprinkles, CMA  October 20, 2009 11:52 AM  Additional Follow-up by: [name] MD,  October 19, 2009 5:34 PM  New/Updated Medications: MAXALT-MLT 10 MG TBDP (RIZATRIPTAN BENZOATE) 1 once daily as needed migraine h/a Prescriptions: MAXALT-MLT 10 MG TBDP (RIZATRIPTAN  BENZOATE) 1 once daily as needed migraine h/a  #12 x 5   Entered by:   Lamar Sprinkles, CMA   Authorized by:   [name] MD   Signed by:   Lamar Sprinkles, CMA on 10/20/2009   Method used:   Electronically to        [facility]* (retail)       [address]       [place], [place]  [id number]       Ph: [phone]       Fax: [phone]   RxID:   [id number]

## 2010-04-11 NOTE — Progress Notes (Signed)
Summary: RELPAX - Pt has ov today  Phone Note From Other Clinic   Caller: Evangeline Gula case manager [phone] Summary of Call: Patient has ov today with MD for f/u. If MD feels that relpax is working we need to do a coverage review. 1 [phone] We will need to request  PA for qty override. Patient will need #30 a month b/c she gets a migraine daily.   Please let us know if patient will be continuing relpax and if you would like PA for #30.  Initial call taken by: Lamar Sprinkles, CMA,  November 29, 2009 9:15 AM  Follow-up for Phone Call        We increased Topamax Samples of Maxalt given Follow-up by: [name] MD,  November 29, 2009 11:17 PM

## 2010-04-11 NOTE — Progress Notes (Signed)
Summary: Insurance denial- Zomig  Phone Note From Pharmacy   Details for Reason: Insurance denial zomig Summary of Call: Dr [employer],  Insurance denied Zomig because proscribed dose exceeds the maximum daily dose. Please  advise.  Thanks Initial call taken by: Dagoberto Reef,  September 29, 2009 10:19 AM  Follow-up for Phone Call        noted. Thx Follow-up by: [name] MD,  September 30, 2009 1:16 PM

## 2010-04-11 NOTE — Progress Notes (Signed)
Summary: QTY OVER RIDE - PA ZOMIG  Phone Note From Other Clinic   Summary of Call: Pt's insurance requires PA for zomig. Ins will cover 12, 10mg  tabs every 90 days. Per pt, she needs at least 8 every month. Is this ok? If yes will need to do PA qty override thru insurance. I have forms to complete. Why qty is ok per month?  Initial call taken by: Lamar Sprinkles, CMA,  September 22, 2009 3:26 PM  Follow-up for Phone Call        I'll fill out forms. Thx Follow-up by: [name] MD,  September 23, 2009 8:57 AM  Additional Follow-up for Phone Call Additional follow up Details #1::        Form will be avail when you return...........Marland KitchenLamar Sprinkles, CMA  September 23, 2009 11:04 AM     Additional Follow-up for Phone Call Additional follow up Details #2::    done. Thx Follow-up by: [name] MD,  September 27, 2009 7:10 AM  Additional Follow-up for Phone Call Additional follow up Details #3:: Details for Additional Follow-up Action Taken: prior auth. request form signed and faxed. Additional Follow-up by: Daphane Shepherd,  September 28, 2009 9:43 AM

## 2010-04-11 NOTE — Assessment & Plan Note (Signed)
Summary: vomiting & diarrhea/SD   Vital Signs:  Patient profile:   65 year old female Height:      62.5 inches Weight:      124 pounds BMI:     22.40 Temp:     97.8 degrees F oral Pulse rate:   82 / minute Pulse rhythm:   regular Resp:     16 per minute BP sitting:   124 / 82  (left arm) Cuff size:   regular  Vitals Entered By: Lanier Prude, CMA(AAMA) (February 13, 2010 3:25 PM) CC: diarrhea, N&V X 2 days, Rt thigh Red&swollen from insulin inj, pt fell sat and hit head. Now c/o HA Is Patient Diabetic? Yes   Primary Care Provider:  [name] MD  CC:  diarrhea, N&V X 2 days, Rt thigh Red&swollen from insulin inj, and pt fell sat and hit head. Now c/o HA.  History of Present Illness: Pt is at behav health to see Dr Luiz Blare now and they are req that Dr Posey Rea see pt due to vomiting & diarrhea - possibly med reaction? She thinks she was doing better on Citalopram  Allergies: 1)  ! Sulfa 2)  ! Avelox (Moxifloxacin Hcl) 3)  ! Effexor 4)  Savella (Milnacipran Hcl) 5)  Duragesic-50 (Fentanyl) 6)  Maxalt 7)  Viibryd (Vilazodone Hcl)  Past History:  Past Medical History: Last updated: 12/23/2009 Depression Diabetes mellitus, type I Hypertension Chronic pain FMS Migraines Breast cancer, hx of - right chronic pancreatitis Anxiety adrenal insufficiency Vit D deficiency GERD L 3d finger dislocated 2009 COPD Anemia-NOS Sternum fracture 11/2008  Osteoporosis (Reclast too expensive $900) Fibromyalgia Hx of pancreatitis Barretts Espohagus 2005 Chronic fatigue Osteoporosis  Social History: Last updated: 08/29/2009 Did not go back to work Oct 23, [name] is very ill - unable to have financial ends meet, no income 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    Physical Exam  General:    NAD Mouth:  Oral mucosa and oropharynx without lesions or exudates.  Teeth in good repair. Lungs:  clear bilaterally without rhonchi, wheezes or crackles  Heart:  RRR, no rubs, gallops or murmurs Abdomen:  S/NT Psych:  Not  tearful, not anxious with less  depressed affect.  not suicidal and not homicidal.     Impression & Recommendations:  Problem # 1:  DIARRHEA, ACUTE (ICD-787.91) due to Viibrid Assessment New d/c Viibryd  Problem # 2:  NAUSEA (ICD-787.02) due to Viibrid Assessment: New d/c Viibryd  Problem # 3:  ANXIETY (ICD-300.00) Assessment: Deteriorated  The following medications were removed from the medication list:    Viibryd 40 Mg Tabs (Vilazodone hcl) .[name]... 1 by mouth qam Her updated medication list for this problem includes:    Amitriptyline Hcl 50 Mg Tabs (Amitriptyline hcl) .[name]... 1/2 or 1 tab by mouth q hs    Lorazepam 2 Mg Tabs (Lorazepam) .[name]... Three times a day prn    Citalopram Hydrobromide 40 Mg Tabs (Citalopram hydrobromide) .[name]... 1 by mouth qd  Problem # 4:  DEPRESSION (ICD-311) Assessment: Deteriorated  The following medications were removed from the medication list:    Viibryd 40 Mg Tabs (Vilazodone hcl) .[name]... 1 by mouth qam Her updated medication list for this problem includes:    Amitriptyline Hcl 50 Mg Tabs (Amitriptyline hcl) .[name]... 1/2 or 1 tab by mouth q hs  Lorazepam 2 Mg Tabs (Lorazepam) .[name]... Three times a day prn    Citalopram Hydrobromide 40 Mg Tabs (Citalopram hydrobromide) .[name]... 1 by mouth qd  Complete Medication List: 1)  Oxycontin 20 Mg Xr12h-tab (Oxycodone hcl) .[name].. 1 by mouth tid 2)  Oxycodone Hcl 15 Mg Tabs (Oxycodone hcl) .[name].. 1 bid as needed pain. 3)  Tamoxifen Citrate 20 Mg Tabs (Tamoxifen citrate) .[name].. 1 bid 4)  Atenolol 100 Mg Tabs (Atenolol) .[name].. 1  po qam 5)  Prilosec Otc 20 Mg Tbec (Omeprazole magnesium) .... 2 daily 6)  Amitriptyline Hcl 50 Mg Tabs (Amitriptyline hcl) .... 1/2 or 1 tab by mouth q hs 7)  Lorazepam 2 Mg  Tabs (Lorazepam) .... Three times a day prn 8)  Cortef 10 Mg Tabs (Hydrocortisone) .[name].. 1 two times a day am and lunch 9)  Zomig 5 Mg Tabs (Zolmitriptan) .[name].. 1 once daily as needed migraine 10)  Nasonex 50 Mcg/act Susp (Mometasone furoate) .[name].. 1-2 each nostril qd 11)  Rocaltrol 0.5 Mcg Caps (Calcitriol) .[name].. 1 by mouth bid 12)  Topamax 100 Mg Tabs (Topiramate) .[name].. 1 by mouth bid 13)  Voltaren 1 % Gel (Diclofenac sodium) .... Two times a day  to qid as needed 14)  Ranitidine Hcl 300 Mg Tabs (Ranitidine hcl) .[name].. 1 po qd 15)  Triamcinolone Acetonide 0.1 % Oint (Triamcinolone acetonide) .... Use two times a day prn45 16)  Accu-chek Aviva Strp (Glucose blood) .... Use two times a day or more 17)  Apidra Solostar 100 Unit/ml Soln (Insulin glulisine) .... 4 to 6 units daily as directed 18)  Levemir Flexpen 100 Unit/ml Soln (Insulin detemir) .[name].. 18 u in am and 14-16 u in pm 19)  Amlodipine Besy-benazepril Hcl 5-10 Mg Caps (Amlodipine besy-benazepril hcl) .[name].. 1 by mouth qd 20)  Bd Pen Needle Short U/f 31g X 8 Mm Misc (Insulin pen needle) .... As dirr 21)  Freestyle Lancets Misc (Lancets) .... As dirr 22)  Accu-chek Soft Touch Lancets Misc (Lancets) .... As dirr 23)  Freestyle Test Strp (Glucose blood) .... Test up to 5 times daily dx: 250.01 24)  Promethazine Hcl 25 Mg Tabs (Promethazine hcl) .[name].. 1-2 by mouth four times a day as needed nausea 25)  Protonix 40 Mg Tbec (Pantoprazole sodium) .[name].. 1 by mouth qam for indigestion 26)  Relpax 40 Mg Tabs (Eletriptan hydrobromide) .[name].. 1 by mouth once daily as needed migraine 27)  Topamax 200 Mg Tabs (Topiramate) .[name].. 1 by mouth two times a day for migraine prevention 28)  Prochlorperazine Maleate 10 Mg Tabs (Prochlorperazine maleate) .[name].. 1 by mouth two times a day 29)  Citalopram Hydrobromide 40 Mg Tabs (Citalopram hydrobromide) .[name].. 1 by mouth qd  Patient Instructions: 1)  Keep return office visit  2)  Stop Viibrid and start Citalopram next  day Prescriptions: CITALOPRAM HYDROBROMIDE 40 MG TABS (CITALOPRAM HYDROBROMIDE) 1 by mouth qd  #30 x 6   Entered and Authorized by:   [name] MD   Signed by:   [name] MD on 02/13/2010   Method used:   Print then Give to Patient   RxID:   [id number]    Orders Added: 1)  Est. Patient Level IV [14782]

## 2010-04-11 NOTE — Assessment & Plan Note (Signed)
Summary: FU Lindsey Gomez   Vital Signs:  Patient profile:   65 year old female Weight:      108 pounds Temp:     98.2 degrees F oral Pulse rate:   76 / minute BP sitting:   104 / 76  (left arm)  Vitals Entered By: Tora Perches (March 14, 2009 1:27 PM) CC: f/u Is Patient Diabetic? Yes   Primary Care Provider:  [name] MD  CC:  f/u.  History of Present Illness: The patient presents for a follow up of hypertension, diabetes, hyperlipidemia, CP, FMS. Lost wt due to nausea [name] L heel sore   Current Medications (verified): 1)  Adderall 30 Mg Tabs (Amphetamine-Dextroamphetamine) .[name].. 1 By Mouth Bid 2)  Oxycontin 40 Mg  Tb12 (Oxycodone Hcl) .[name].. 1 By Mouth Three Times A Day 3)  Oxycodone Hcl 15 Mg  Tabs (Oxycodone Hcl) .[name].. 1 Qid As Needed Pain. 4)  Tamoxifen Citrate 20 Mg  Tabs (Tamoxifen Citrate) .[name].. 1 Bid 5)  Atenolol 100 Mg Tabs (Atenolol) .[name].. 1  Po Qam 6)  Prilosec Otc 20 Mg Tbec (Omeprazole Magnesium) .... 2 Daily 7)  Citalopram Hydrobromide 20 Mg Tabs (Citalopram Hydrobromide) .[name].. 1-2 Once Daily 8)  Lorazepam 2 Mg Tabs (Lorazepam) .... Three Times A Day Prn 9)  Cortef 10 Mg  Tabs (Hydrocortisone) .[name].. 1 Two Times A Day Am and Lunch 10)  Zomig 5 Mg  Tabs (Zolmitriptan) .[name].. 1 Once Daily As Needed Migraine 11)  Nasonex 50 Mcg/act Susp (Mometasone Furoate) .[name].. 1-2 Each Nostril Qd 12)  Rocaltrol 0.5 Mcg Caps (Calcitriol) .[name].. 1 By Mouth Qd 13)  Topamax 100 Mg  Tabs (Topiramate) .[name].. 1 By Mouth Bid 14)  Promethazine Hcl 25 Mg  Tabs (Promethazine Hcl) .[name].. 1 Q 6h As Needed Nausea 15)  Voltaren 1 %  Gel (Diclofenac Sodium) .... Two Times A Day  To Qid As Needed 16)  Ranitidine Hcl 300 Mg Tabs (Ranitidine Hcl) .[name].. 1 Po Qd 17)  Triamcinolone Acetonide 0.1 % Oint (Triamcinolone Acetonide) .... Use Two Times A Day Prn45 18)  Accu-Chek Aviva  Strp (Glucose Blood) .... Use Two Times A Day or More 19)  Bd U/f Original Pen Needle 29g X 12.49mm Misc (Insulin Pen Needle) ....  Use Two Times A Day 20)  Apidra Solostar 100 Unit/ml Soln (Insulin Glulisine) .... As Dirr 21)  Levemir Flexpen 100 Unit/ml  Soln (Insulin Detemir) .[name].. 18 U in Am and 14-16 U in Pm 22)  Meperidine Hcl 50 Mg Tabs (Meperidine Hcl) .[name].. 1 By Mouth Two Times A Day As Needed Severe Pain Breakthrough 23)  Amitriptyline Hcl 50 Mg Tabs (Amitriptyline Hcl) .[name].. 1 By Mouth Q Hs 24)  Fentanyl 50 Mcg/hr Pt72 (Fentanyl) .[name].. 1  Patch Q 3 Days For Pain Fill On or After 02/28/2009  Allergies: 1)  ! Sulfa 2)  ! Avelox (Moxifloxacin Hcl) 3)  ! 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  The patient denies syncope, peripheral edema, abdominal pain, and abnormal bleeding.    Physical Exam  General:  Less anxious and less teary NAD Nose:  External nasal examination shows no deformity or inflammation. Nasal mucosa are pink and moist without lesions or exudates. Mouth:  Oral mucosa and oropharynx without lesions or exudates.  Teeth in good repair. Neck:  No deformities, masses, or tenderness noted. Chest Wall:  Painful on R Lungs:  clear bilaterally without rhonchi, wheezes or crackles  Heart:  RRR, no rubs, gallops or murmurs Abdomen:  S/NT Msk:  Lumbar-sacral  spine is tender to palpation over paraspinal muscles and painfull with the ROM   Extremities:  no edema, no erythema  Neurologic:  alert & oriented X3.   Skin:  no rash L heel w/1x3 cm non tenderhealed ulcer Psych:   tearful, moderately anxious  depressed affect.     Impression & Recommendations:  Problem # 1:  FOOT ULCER (ICD-707.15) R heel Assessment Improved Finished antibiotic I trimmed dead skin  Problem # 2:  TRAUMAT HEMOTHOR W/O MENTION OPEN WOUND IN THOR (ICD-860.2) Assessment: Improved Dr Edwyna Shell appt is pending next Tue  Problem # 3:  PNEUMOTHORAX (ICD-512.8) Assessment: Improved as above  Problem # 4:  CHEST PAIN (ICD-786.50) Assessment: Improved  Complete Medication List: 1)  Adderall 30 Mg Tabs (Amphetamine-dextroamphetamine) .[name].. 1 by mouth bid 2)  Oxycontin 40 Mg Tb12 (Oxycodone hcl) .[name].. 1 by mouth three times a day 3)  Oxycodone Hcl 15 Mg Tabs (Oxycodone hcl) .[name].. 1 qid as needed pain. 4)  Tamoxifen Citrate 20 Mg Tabs (Tamoxifen citrate) .[name].. 1 bid 5)  Atenolol 100 Mg Tabs (Atenolol) .[name].. 1  po qam 6)  Prilosec Otc 20 Mg Tbec (Omeprazole magnesium) .... 2 daily 7)  Citalopram Hydrobromide 20 Mg Tabs (Citalopram hydrobromide) .[name].. 1-2 once daily 8)  Lorazepam 2 Mg Tabs (Lorazepam) .... Three times a day prn 9)  Cortef 10 Mg Tabs (Hydrocortisone) .[name].. 1 two times a day am and lunch 10)  Zomig 5 Mg Tabs (Zolmitriptan) .[name].. 1 once daily as needed migraine 11)  Nasonex 50 Mcg/act Susp (Mometasone furoate) .[name].. 1-2 each nostril qd 12)  Rocaltrol 0.5 Mcg Caps (Calcitriol) .[name].. 1 by mouth qd 13)  Topamax 100 Mg Tabs (Topiramate) .[name].. 1 by mouth bid 14)  Promethazine Hcl 25 Mg Tabs (Promethazine hcl) .[name].. 1 q 6h as needed nausea 15)  Voltaren 1 % Gel (Diclofenac sodium) .... Two times a day  to qid as needed 16)  Ranitidine Hcl 300 Mg Tabs (Ranitidine hcl) .[name].. 1 po qd 17)  Triamcinolone Acetonide 0.1 % Oint (Triamcinolone acetonide) .... Use two times a day  prn45 18)  Accu-chek Aviva Strp (Glucose blood) .... Use two times a day or more 19)  Bd U/f Original Pen Needle 29g X 12.3mm Misc (Insulin pen needle) .... Use two times a day 20)  Apidra Solostar 100 Unit/ml Soln (Insulin glulisine) .... As dirr 21)  Levemir Flexpen 100 Unit/ml Soln (Insulin detemir) .[name].. 18 u in am and 14-16 u in pm 22)  Meperidine Hcl 50 Mg Tabs (Meperidine hcl) .[name].. 1 by mouth two times a day as needed severe pain breakthrough 23)  Amitriptyline Hcl 50 Mg Tabs (Amitriptyline hcl) .[name].. 1 by mouth q hs  Patient Instructions: 1)  Please schedule a follow-up appointment in 1 month. Prescriptions: OXYCODONE  HCL 15 MG  TABS (OXYCODONE HCL) 1 qid as needed pain.  #120 x 0   Entered and Authorized by:   [name] MD   Signed by:   [name] MD on 03/14/2009   Method used:   Print then Give to Patient   RxID:   [id number] OXYCONTIN 40 MG  TB12 (OXYCODONE HCL) 1 by mouth three times a day  #90 x 0   Entered and Authorized by:   [name] MD   Signed by:   [name] MD on 03/14/2009   Method used:   Print then Give to Patient   RxID:   [id number]

## 2010-04-11 NOTE — Procedures (Signed)
Summary: EGD   EGD  Procedure date:  10/18/2003  Findings:      Findings: Esophagitis  Location: [facility]   Patient Name: Lindsey, Gomez MRN: [id number] Procedure Procedures: Panendoscopy (EGD) CPT: 43235.    with biopsy(s)/brushing(s). CPT: D1846139.  Personnel: Endoscopist: Barbette Hair. Arlyce Dice, MD.  Indications Symptoms: Nausea. Anorexia.  History  Current Medications: Patient is not currently taking Coumadin.  Pre-Exam Physical: Performed Oct 18, 2003  Entire physical exam was normal.  Exam Exam Info: Maximum depth of insertion Duodenum, intended Duodenum. Vocal cords visualized. Gastric retroflexion performed. ASA Classification: II. Tolerance: good.  Sedation Meds: Robinul 0.2 given IV. Fentanyl 87.5 given IV. Versed 6 mg. given IV. Cetacaine Spray 2 sprays given aerosolized.  Monitoring: BP and pulse monitoring done. Oximetry used. Supplemental O2 given at 2 Liters.  Findings - ESOPHAGEAL INFLAMMATION: Proximal margin 38 cm from mouth,  distal margin 39 cm. Length of inflammation: 1 cm. Edema present. [place] Classification: Grade 0. Biopsy/Esoph Inflamtn taken. ICD9: Esophagitis: 530.10. Comments: Bxs taken to r/o Barrett's.   Assessment Abnormal examination, see findings above.  Diagnoses: 530.10: Esophagitis.   Events  Unplanned Intervention: No unplanned interventions were required.  Unplanned Events: There were no complications. Plans Medication(s): Await pathology. Continue current medications.   This report was created from the original endoscopy report, which was reviewed and signed by the above listed endoscopist.

## 2010-04-11 NOTE — Progress Notes (Signed)
Summary: Maxalt  Phone Note Call from Patient Call back at Home Phone [phone]   Caller: Patient Call For: [name] MD Summary of Call: Maxalt MLT 10 mg her new migraine med is making her migraine's worse and causes her to vomit.Pt wants changed to another medication. Please advise. Initial call taken by: Verdell Face,  November 09, 2009 3:11 PM  Follow-up for Phone Call        Try Relpax Follow-up by: [name] MD,  November 09, 2009 4:55 PM  Additional Follow-up for Phone Call Additional follow up Details #1::        Pt informed  Additional Follow-up by: Lamar Sprinkles, CMA,  November 09, 2009 5:15 PM   New Allergies: MAXALT New/Updated Medications: RELPAX 40 MG TABS (ELETRIPTAN HYDROBROMIDE) 1 by mouth once daily as needed migraine New Allergies: MAXALTPrescriptions: RELPAX 40 MG TABS (ELETRIPTAN HYDROBROMIDE) 1 by mouth once daily as needed migraine  #12 x 11   Entered and Authorized by:   [name] MD   Signed by:   [name] MD on 11/09/2009   Method used:   Electronically to        [facility]* (retail)       [address]       [place], [place]  [id number]       Ph: [phone]       Fax: [phone]   RxID:   [id number]

## 2010-04-11 NOTE — Progress Notes (Signed)
  Phone Note Other Incoming   Caller: pt Summary of Call: Needs ref Initial call taken by: [name] MD,  December 14, 2009 4:35 PM    Prescriptions: PROMETHAZINE HCL 25 MG TABS (PROMETHAZINE HCL) 1-2 by mouth four times a day as needed nausea  #60 x 3   Entered and Authorized by:   [name] MD   Signed by:   [name] MD on 12/14/2009   Method used:   Electronically to        [facility]* (retail)       [address]       [place], [place]  [address]       Ph: [phone]       Fax: [phone]   RxID:   [id number] RANITIDINE HCL 300 MG TABS (RANITIDINE HCL) 1 po qd  #30 x 12   Entered and Authorized by:   [name] MD   Signed by:   [name] MD on 12/14/2009   Method used:   Electronically to        [facility]* (retail)       [address]       [place], [place]  [address]       Ph: [phone]       Fax: [phone]   RxID:   [id number]

## 2010-04-11 NOTE — Progress Notes (Signed)
Summary: Prolia  Phone Note Other Incoming   Summary of Call: We rec'd fax in regards to Prolia. Patient can receive tx for $70 Co-pay.  Patient spouse will have the patient call our office. Initial call taken by: Lucious Groves,  April 19, 2009 1:23 PM  Follow-up for Phone Call        informed pt and she would like to set up appt. Follow-up by: Ami Bullins CMA,  April 22, 2009 1:56 PM  Additional Follow-up for Phone Call Additional follow up Details #1::        Patient has upcoming office visit so we will do injection at that time. Spouse confirmed. Additional Follow-up by: Lucious Groves,  April 26, 2009 1:29 PM

## 2010-04-11 NOTE — Progress Notes (Signed)
Summary: Prolia  Phone Note Outgoing Call   Call placed by: Lanier Prude, [facility]),  December 06, 2009 8:16 AM Summary of Call: faxed Prolia form to reverify benefits  Follow-up for Phone Call        Called pt to inform of Prolia coverage. She is not at home.  Spoke to pt's husband Lindsey Gomez. I informed him that pt will owe 0 out of pocket for Prolia.  I asked him to have her call me tom am to sched this appt Follow-up by: Lanier Prude, [facility]),  December 08, 2009 3:29 PM  Additional Follow-up for Phone Call Additional follow up Details #1::        I spoke to pt she would ilke to receive Prolia inj at 12-23-09 OV.  Will order med Additional Follow-up by: Lanier Prude, [facility]),  December 09, 2009 2:04 PM    Additional Follow-up for Phone Call Additional follow up Details #2::    noted Thx! Follow-up by: [name] MD,  December 09, 2009 4:54 PM

## 2010-04-11 NOTE — Progress Notes (Signed)
Summary: directions  Phone Note From Pharmacy   Caller: [facility]* Summary of Call: Per pharmacy, please advise on directions for Apidra Solostar, checked emr, just says as directed Initial call taken by: Rock Nephew CMA,  July 07, 2009 8:59 AM  Follow-up for Phone Call        PlDonna how much she is using and report to pharm. Thx Follow-up by: [name] MD,  July 07, 2009 12:02 PM  Additional Follow-up for Phone Call Additional follow up Details #1::        Spoke w/pt's husband, he is not sure and will have pt call us back w/the info. Additional Follow-up by: Lamar Sprinkles, CMA,  July 07, 2009 3:38 PM    New/Updated Medications: APIDRA SOLOSTAR 100 UNIT/ML SOLN (INSULIN GLULISINE) 4 to 6 units daily as directed Prescriptions: APIDRA SOLOSTAR 100 UNIT/ML SOLN (INSULIN GLULISINE) 4 to 6 units daily as directed  #3 mth x 1   Entered by:   Lamar Sprinkles, CMA   Authorized by:   [name] MD   Signed by:   Lamar Sprinkles, CMA on 07/07/2009   Method used:   Faxed to ...       [facility]* (retail)       [address]       [place], [place]  [id number]       Ph: [phone]       Fax: [phone]   RxID:   [id number]

## 2010-04-11 NOTE — Assessment & Plan Note (Signed)
Summary: 1 MTH FU--D/T--STC   Vital Signs:  Patient profile:   65 year old female Weight:      116 pounds Temp:     98.3 degrees F oral Pulse rate:   74 / minute BP sitting:   114 / 78  (left arm)  Vitals Entered By: Tora Perches (May 10, 2009 3:40 PM) CC: f/u Is Patient Diabetic? Yes   Primary Care Provider:  [name] MD  CC:  f/u.  History of Present Illness: The patient presents for a follow up of hypertension, diabetes, hyperlipidemia, HAs   Current Medications (verified): 1)  Adderall 30 Mg Tabs (Amphetamine-Dextroamphetamine) .[name].. 1 By Mouth Bid 2)  Oxycontin 40 Mg  Tb12 (Oxycodone Hcl) .[name].. 1 By Mouth Three Times A Day 3)  Oxycodone Hcl 15 Mg  Tabs (Oxycodone Hcl) .[name].. 1 Qid As Needed Pain. 4)  Tamoxifen Citrate 20 Mg  Tabs (Tamoxifen Citrate) .[name].. 1 Bid 5)  Atenolol 100 Mg Tabs (Atenolol) .[name].. 1  Po Qam 6)  Prilosec Otc 20 Mg Tbec (Omeprazole Magnesium) .... 2 Daily 7)  Citalopram Hydrobromide 20 Mg Tabs (Citalopram Hydrobromide) .[name].. 1-2 Once Daily 8)  Lorazepam 2 Mg Tabs (Lorazepam) .... Three Times A Day Prn 9)  Cortef 10 Mg  Tabs (Hydrocortisone) .[name].. 1 Two Times A Day Am and Lunch 10)  Zomig 5 Mg  Tabs (Zolmitriptan) .[name].. 1 Once Daily As Needed Migraine 11)  Nasonex 50 Mcg/act Susp (Mometasone Furoate) .[name].. 1-2 Each Nostril Qd 12)  Rocaltrol 0.5 Mcg Caps (Calcitriol) .[name].. 1 By Mouth Qd 13)  Topamax 100 Mg  Tabs (Topiramate) .[name].. 1 By Mouth Bid 14)  Promethazine Hcl 25 Mg  Tabs (Promethazine Hcl) .[name].. 1 Q 6h As Needed Nausea 15)  Voltaren 1 %  Gel (Diclofenac Sodium) .... Two Times A Day  To Qid As Needed 16)  Ranitidine Hcl 300 Mg Tabs (Ranitidine Hcl) .[name].. 1 Po Qd 17)  Triamcinolone Acetonide 0.1 % Oint (Triamcinolone Acetonide) .... Use Two Times A Day Prn45 18)  Accu-Chek Aviva  Strp (Glucose Blood) .... Use Two Times A Day or More 19)  Bd U/f Original Pen Needle 29g X 12.45mm Misc (Insulin Pen Needle) .... Use Two Times A Day 20)  Apidra  Solostar 100 Unit/ml Soln (Insulin Glulisine) .... As Dirr 21)  Levemir Flexpen 100 Unit/ml  Soln (Insulin Detemir) .[name].. 18 U in Am and 14-16 U in Pm 22)  Meperidine Hcl 50 Mg Tabs (Meperidine Hcl) .[name].. 1 By Mouth Two Times A Day As Needed Severe Pain Breakthrough 23)  Amitriptyline Hcl 50 Mg Tabs (Amitriptyline Hcl) .[name].. 1 By Mouth Q Hs  Allergies: 1)  ! Sulfa 2)  ! Avelox (Moxifloxacin Hcl) 3)  ! 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 Family History Hypertension father with COPD  Social History: Reviewed history from 09/25/2007 and  no changes required. 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    Physical Exam  General:  Less anxious and less teary NAD Nose:  External nasal examination shows no deformity or inflammation. Nasal mucosa are pink and moist without lesions or exudates. Mouth:  Oral mucosa and oropharynx without lesions or exudates.  Teeth in good repair. Neck:   No deformities, masses, or tenderness noted. Lungs:  clear bilaterally without rhonchi, wheezes or crackles  Heart:  RRR, no rubs, gallops or murmurs Abdomen:  S/NT Msk:  Lumbar-sacral spine is tender to palpation over paraspinal muscles and painfull with the ROM   Extremities:  no edema, no erythema  Neurologic:  alert & oriented X3.   Skin:  no rash L heel w/1x3 cm non tenderhealed ulcer Psych:  Not  tearful, not anxious with less  depressed affect.     Impression & Recommendations:  Problem # 1:  DIABETES MELLITUS, TYPE I (ICD-250.01) Assessment Improved  Her updated medication list for this problem includes:    Apidra Solostar 100 Unit/ml Soln (Insulin glulisine) .[name]... As dirr    Levemir Flexpen 100 Unit/ml Soln (Insulin detemir) .Marland KitchenMarland KitchenMarland KitchenMarland Kitchen 18 u in am and 14-16 u in pm  Problem # 2:  FRACTURE, STERNUM (ICD-807.2) Assessment: Improved  Problem # 3:  HEADACHE (ICD-784.0) Assessment: Deteriorated  Her updated medication list for this problem includes:    Oxycontin 40 Mg Tb12 (Oxycodone hcl) .[name]... 1 by mouth three times a day    Oxycodone Hcl 15 Mg Tabs (Oxycodone hcl) .[name]... 1 qid as needed pain.    Atenolol 100 Mg Tabs (Atenolol) .[name]... 1  po qam    Zomig 5 Mg Tabs (Zolmitriptan) .[name]... 1 once daily as needed migraine    Meperidine Hcl 50 Mg Tabs (Meperidine hcl) .[name]... 1 by mouth two times a day as needed severe pain breakthrough  Problem # 4:  DISORDER, ADRENAL NEC (ICD-255.8) Assessment: Unchanged On prescription therapy   Problem # 5:  FIBROMYALGIA (ICD-729.1) Assessment: Unchanged  Her updated medication list for this problem includes:    Oxycontin 40 Mg Tb12 (Oxycodone hcl) .[name]... 1 by mouth three times a day    Oxycodone Hcl 15 Mg Tabs (Oxycodone hcl) .[name]... 1 qid as needed pain.    Meperidine Hcl 50 Mg Tabs (Meperidine hcl) .[name]... 1 by mouth two times a day as needed severe pain breakthrough  Problem # 6:  DEPRESSION (ICD-311) Assessment: Unchanged  Her updated  medication list for this problem includes:    Citalopram Hydrobromide 20 Mg Tabs (Citalopram hydrobromide) .[name]... 1-2 once daily    Lorazepam 2 Mg Tabs (Lorazepam) .[name]... Three times a day prn    Amitriptyline Hcl 50 Mg Tabs (Amitriptyline hcl) .[name]... 1 by mouth q hs  Problem # 7:  SOMNOLENCE (ICD-780.09) Assessment: Unchanged On prescription therapy   Problem # 8:  OSTEOPOROSIS (ICD-733.00) Assessment: Unchanged  Orders: Prolia 60mg  (J3590) Admin of Therapeutic Inj  intramuscular or subcutaneous (36644)  Complete Medication List: 1)  Adderall 30 Mg Tabs (Amphetamine-dextroamphetamine) .[name].. 1 by mouth bid 2)  Oxycontin 40 Mg Tb12 (Oxycodone hcl) .[name].. 1 by mouth three times a day 3)  Oxycodone Hcl 15 Mg Tabs (Oxycodone hcl) .[name].. 1 qid as needed  pain. 4)  Tamoxifen Citrate 20 Mg Tabs (Tamoxifen citrate) .[name].. 1 bid 5)  Atenolol 100 Mg Tabs (Atenolol) .[name].. 1  po qam 6)  Prilosec Otc 20 Mg Tbec (Omeprazole magnesium) .... 2 daily 7)  Citalopram Hydrobromide 20 Mg Tabs (Citalopram hydrobromide) .[name].. 1-2 once daily 8)  Lorazepam 2 Mg Tabs (Lorazepam) .... Three times a day prn 9)  Cortef 10 Mg Tabs (Hydrocortisone) .[name].. 1 two times a day am and lunch 10)  Zomig 5 Mg Tabs (Zolmitriptan) .[name].. 1 once daily as needed migraine 11)  Nasonex 50 Mcg/act Susp (Mometasone furoate) .[name].. 1-2 each nostril qd 12)  Rocaltrol 0.5 Mcg Caps (Calcitriol) .[name].. 1 by mouth qd 13)  Topamax 100 Mg Tabs (Topiramate) .[name].. 1 by mouth bid 14)  Promethazine Hcl 25 Mg Tabs (Promethazine hcl) .[name].. 1 q 6h as needed nausea 15)  Voltaren 1 % Gel (Diclofenac sodium) .... Two times a day  to qid as needed 16)  Ranitidine Hcl 300 Mg Tabs (Ranitidine hcl) .[name].. 1 po qd 17)  Triamcinolone Acetonide 0.1 % Oint (Triamcinolone acetonide) .... Use two times a day prn45 18)  Accu-chek Aviva Strp (Glucose blood) .... Use two times a day or more 19)  Bd U/f Original Pen Needle 29g X 12.63mm Misc (Insulin pen needle) .... Use two times a  day 20)  Apidra Solostar 100 Unit/ml Soln (Insulin glulisine) .... As dirr 21)  Levemir Flexpen 100 Unit/ml Soln (Insulin detemir) .[name].. 18 u in am and 14-16 u in pm 22)  Meperidine Hcl 50 Mg Tabs (Meperidine hcl) .[name].. 1 by mouth two times a day as needed severe pain breakthrough 23)  Amitriptyline Hcl 50 Mg Tabs (Amitriptyline hcl) .[name].. 1 by mouth q hs  Patient Instructions: 1)  Please schedule a follow-up appointment in 1 month. Prescriptions: OXYCODONE HCL 15 MG  TABS (OXYCODONE HCL) 1 qid as needed pain.  #120 x 0   Entered and Authorized by:   [name] MD   Signed by:   [name] MD on 05/10/2009   Method used:   Print then Give to Patient   RxID:   [id number] OXYCONTIN 40 MG  TB12 (OXYCODONE HCL) 1 by mouth three times a day  #90 x 0   Entered and Authorized by:   [name] MD   Signed by:   [name] MD on 05/10/2009   Method used:   Print then Give to Patient   RxID:   [id number] ADDERALL 30 MG TABS (AMPHETAMINE-DEXTROAMPHETAMINE) 1 by mouth bid  #60 x 0   Entered and Authorized by:   [name] MD   Signed by:   [name] MD on 05/10/2009   Method used:   Print then Give to Patient   RxID:   [id number] MEPERIDINE HCL 50 MG TABS (MEPERIDINE HCL) 1 by mouth two times a day as needed severe pain breakthrough  #20 x 0   Entered and Authorized by:   [name] MD   Signed by:   [name] MD on 05/10/2009   Method used:   Print then Give to Patient   RxID:   [id number] ZOMIG 5 MG  TABS (ZOLMITRIPTAN) 1 once daily as needed migraine  #12 Each x 12   Entered and Authorized by:   [name] MD   Signed by:   [name] MD on 05/10/2009   Method used:   Print then Give to Patient  RxID:   [id number]    Medication Administration  Injection # 1:    Medication: Prolia 60mg     Diagnosis: OSTEOPOROSIS (ICD-733.00)    Route: SQ    Site: Left arm    Exp  Date: 03/12/2010    Lot #: [id number]    Patient tolerated injection without complications    Given by: Lamar Sprinkles, CMA (May 10, 2009 4:36 PM)  Orders Added: 1)  Prolia 60mg  [J3590] 2)  Admin of Therapeutic Inj  intramuscular or subcutaneous [96372] 3)  Est. Patient Level IV [46962]

## 2010-04-11 NOTE — Progress Notes (Signed)
Summary: PA-Omeprazole  Phone Note From Pharmacy   Summary of Call: PA-Omeprazole, contacted [facility] @ [phone] awaiting form. Dagoberto Reef  October 27, 2009 3:54 PM  Faxed to [facility] @ [phone], case # [id number]  awaiting approval. Initial call taken by: Dagoberto Reef,  October 28, 2009 3:44 PM  Follow-up for Phone Call        PA-Omeprazole approved 10/07/09-10/28/10,  case # [id number] pt aware . Follow-up by: Dagoberto Reef,  October 31, 2009 2:48 PM

## 2010-04-11 NOTE — Letter (Signed)
[facility]   Im[facility] By: Sherian Rein 11/11/2009 13:36:06  _____________________________________________________________________  External Attachment:    Type:   Image     Comment:   External Document

## 2010-04-11 NOTE — Letter (Signed)
[facility]   Imported By: Sherian Rein 05/05/2009 12:13:48  _____________________________________________________________________  External Attachment:    Type:   Image     Comment:   External Document

## 2010-04-11 NOTE — Progress Notes (Signed)
[facility]   Imported By: Sherian Rein 08/03/2009 11:09:34  _____________________________________________________________________  External Attachment:    Type:   Image     Comment:   External Document

## 2010-04-11 NOTE — Medication Information (Signed)
Summary: Prior Authorization/[facility]  Prior Authorization/[facility]   Imported By: Lester Edgerton 04/30/2009 09:11:58  _____________________________________________________________________  External Attachment:    Type:   Image     Comment:   External Document

## 2010-04-11 NOTE — Progress Notes (Signed)
Summary: Amphetamine ??  Phone Note From Pharmacy Message from:  Fax from Pharmacy  Caller: [facility]* Summary of Call: rec fax from pharm stating Amphetamine salts dose was changed to 20mg .  It states she had been taking 30mg  1 two times a day.   If dose is to be changed to 20mg  1 two times a day it will require a prior auth.  Please advise which dose.   ****I do not see this on med list****** Initial call taken by: Lanier Prude, [facility]),  December 13, 2009 8:46 AM  Follow-up for Phone Call        It is adderall The correct dose is 20 mg Follow-up by: [name] MD,  December 13, 2009 1:02 PM  Additional Follow-up for Phone Call Additional follow up Details #1::        pharmacy informed. PA  required Additional Follow-up by: Lanier Prude, [facility]),  December 13, 2009 4:03 PM

## 2010-04-11 NOTE — Medication Information (Signed)
Summary: Amphetamine Salt Combo DENIED/[facility]  Amphetamine Salt Combo DENIED/[facility]   Imported By: Sherian Rein 04/28/2009 11:00:05  _____________________________________________________________________  External Attachment:    Type:   Image     Comment:   External Document

## 2010-04-11 NOTE — Assessment & Plan Note (Signed)
Summary: FU Lindsey Gomez  #   Vital Signs:  Patient profile:   65 year old female Height:      62.5 inches Weight:      125 pounds BMI:     22.58 Temp:     98.8 degrees F oral Pulse rate:   88 / minute Pulse rhythm:   regular Resp:     16 per minute BP sitting:   140 / 98  (left arm) Cuff size:   regular  Vitals Entered By: Lanier Prude, Beverly Gust) (October 25, 2009 2:27 PM) CC: f/u  c/o HA, N&V and elevated BS Is Patient Diabetic? Yes CBG Result 280   Primary Care [name]:  [name] MD  CC:  f/u  c/o HA and N&V and elevated BS.  History of Present Illness: The patient presents for a follow up of HAs, hypertension, diabetes, hyperlipidemia, fatigue. Lindsey Gomez is more depressed, staying in bed a lot.Marland KitchenMarland KitchenMatteson is all upset naturally.  Current Medications (verified): 1)  Adderall 30 Mg Tabs (Amphetamine-Dextroamphetamine) .[name].. 1 By Mouth Bid 2)  Oxycontin 40 Mg  Tb12 (Oxycodone Hcl) .[name].. 1 By Mouth Three Times A Day 3)  Oxycodone Hcl 15 Mg  Tabs (Oxycodone Hcl) .[name].. 1 Qid As Needed Pain. 4)  Tamoxifen Citrate 20 Mg  Tabs (Tamoxifen Citrate) .[name].. 1 Bid 5)  Atenolol 100 Mg Tabs (Atenolol) .[name].. 1  Po Qam 6)  Prilosec Otc 20 Mg Tbec (Omeprazole Magnesium) .... 2 Daily 7)  Amitriptyline Hcl 50 Mg Tabs (Amitriptyline Hcl) .[name].. 1 By Mouth Q Hs 8)  Citalopram Hydrobromide 20 Mg Tabs (Citalopram Hydrobromide) .[name].. 1-2 Once Daily 9)  Lorazepam 2 Mg Tabs (Lorazepam) .... Three Times A Day Prn 10)  Cortef 10 Mg  Tabs (Hydrocortisone) .[name].. 1 Two Times A Day Am and Lunch 11)  Zomig 5 Mg  Tabs (Zolmitriptan) .[name].. 1 Once Daily As Needed Migraine 12)  Nasonex 50 Mcg/act Susp (Mometasone Furoate) .[name].. 1-2 Each Nostril Qd 13)  Rocaltrol 0.5 Mcg Caps (Calcitriol) .[name].. 1 By Mouth Qd 14)  Topamax 100 Mg  Tabs (Topiramate) .[name].. 1 By Mouth Bid 15)  Voltaren 1 %  Gel (Diclofenac Sodium) .... Two Times A Day  To Qid As Needed 16)  Ranitidine Hcl 300 Mg Tabs (Ranitidine Hcl) .[name].. 1 Po Qd 17)   Triamcinolone Acetonide 0.1 % Oint (Triamcinolone Acetonide) .... Use Two Times A Day Prn45 18)  Accu-Chek Aviva  Strp (Glucose Blood) .... Use Two Times A Day or More 19)  Apidra Solostar 100 Unit/ml Soln (Insulin Glulisine) .... 4 To 6 Units Daily As Directed 20)  Levemir Flexpen 100 Unit/ml  Soln (Insulin Detemir) .[name].. 18 U in Am and 14-16 U in Pm 21)  Meperidine Hcl 50 Mg Tabs (Meperidine Hcl) .[name].. 1 By Mouth Two Times A Day As Needed Severe Pain Breakthrough 22)  Omeprazole 40 Mg Cpdr (Omeprazole) .[name].. 1 By Mouth Qam For Indigestion 23)  Amlodipine Besy-Benazepril Hcl 5-10 Mg Caps (Amlodipine Besy-Benazepril Hcl) .[name].. 1 By Mouth Qd 24)  Bd Pen Needle Short U/f 31g X 8 Mm Misc (Insulin Pen Needle) .... As Dirr 25)  Freestyle Lancets  Misc (Lancets) .... As Dirr 26)  Accu-Chek Soft Touch Lancets  Misc (Lancets) .... As Dirr 27)  Freestyle Test  Strp (Glucose Blood) .... Test Up To 5 Times Daily Dx: 250.01 28)  Promethazine Hcl 25 Mg Tabs (Promethazine Hcl) .[name].. 1-2 By Mouth Four Times A Day As Needed Nausea 29)  Maxalt-Mlt 10 Mg Tbdp (Rizatriptan  Benzoate) .[name].. 1 Once Daily As Needed Migraine H/a  Allergies (verified): 1)  ! Sulfa 2)  ! Avelox (Moxifloxacin Hcl) 3)  ! Effexor 4)  Savella (Milnacipran Hcl) 5)  Duragesic-50 (Fentanyl)  Past History:  Past Medical History: Last updated: 08/10/2009 Depression Diabetes mellitus, type I Hypertension Chronic pain FMS Migraines Breast cancer, hx of - right chronic pancreatitis Anxiety adrenal insufficiency Vit D deficiency GERD L 3d finger dislocated 2009 COPD Anemia-NOS Sternum fracture 11/2008  Osteoporosis (Reclast too expensive $900) Fibromyalgia Hx of pancreatitis Barretts Espohagus 2005 Chronic fatigue  Past Surgical History: Last updated: 05/09/2007 Sinus surgery 40% Part Pancreatecomty Cholecystectomy  Social History: Last updated: 08/29/2009 Did not go back to work Oct 23, Lindsey Gomez is very ill - unable to have  financial ends meet, no income 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  The patient denies fever and dyspnea on exertion.         HAs, n/v  Physical Exam  General:  anxious and less teary NAD Head:  NT Eyes:  vision grossly intact, pupils equal, and pupils round.   Ears:  R ear normal and L ear normal.   Nose:  External nasal examination shows no deformity or inflammation. Nasal mucosa are pink and moist without lesions or exudates. Mouth:  Oral mucosa and oropharynx without lesions or exudates.  Teeth in good repair. Neck:  No deformities, masses, or tenderness noted. Chest Wall:  NT Lungs:  clear bilaterally without rhonchi, wheezes or crackles  Heart:  RRR, no rubs, gallops or murmurs Abdomen:  S/NT Msk:  Lumbar-sacral spine is tender to palpation over paraspinal muscles and painfull with the ROM   Extremities:  no edema, no erythema  Neurologic:  alert & oriented X3.   Skin:  no rash B feet ok  Cervical Nodes:  No lymphadenopathy noted Inguinal Nodes:  No significant adenopathy Psych:  Not  tearful, not anxious with less  depressed affect.     Impression & Recommendations:  Problem # 1:  DIABETES MELLITUS, TYPE I (ICD-250.01) Assessment Deteriorated  Her updated medication list for this problem includes:    Apidra Solostar 100 Unit/ml Soln (Insulin glulisine) .Marland KitchenMarland KitchenMarland KitchenMarland Kitchen 4 to 6 units daily as directed    Levemir Flexpen 100 Unit/ml Soln (Insulin detemir) .Marland KitchenMarland KitchenMarland KitchenMarland Kitchen 18 u in am and 14-16 u in pm    Amlodipine Besy-benazepril Hcl 5-10 Mg Caps (Amlodipine besy-benazepril hcl) .[name]... 1 by mouth qd  Labs Reviewed: Creat: 0.8 (08/02/2009)    Reviewed HgBA1c results: 8.2 (10/18/2009)  7.9 (08/02/2009)  Problem # 2:  FATIGUE (ICD-780.79) multifactorial Assessment: Deteriorated  Problem #  3:  HEADACHE (ICD-784.0) Assessment: Unchanged  Her updated medication list for this problem includes:    Oxycontin 40 Mg Tb12 (Oxycodone hcl) .[name]... 1 by mouth three times a day    Oxycodone Hcl 15 Mg Tabs (Oxycodone hcl) .[name]... 1 qid as needed pain.    Meperidine Hcl 50 Mg Tabs (Meperidine hcl) .[name]... 1 by mouth two times a day as needed severe pain breakthrough    Atenolol 100 Mg Tabs (Atenolol) .[name]... 1  po qam    Zomig 5 Mg Tabs (Zolmitriptan) .[name]... 1 once daily as needed migraine    Maxalt-mlt 10 Mg Tbdp (Rizatriptan benzoate) .[name]... 1 once daily as needed migraine h/a  Problem # 4:  ANXIETY (ICD-300.00) Assessment:  Deteriorated  Her updated medication list for this problem includes:    Amitriptyline Hcl 50 Mg Tabs (Amitriptyline hcl) .[name]... 1 by mouth q hs    Citalopram Hydrobromide 20 Mg Tabs (Citalopram hydrobromide) .[name]... 1-2 once daily    Lorazepam 2 Mg Tabs (Lorazepam) .[name]... Three times a day prn  Problem # 5:  FIBROMYALGIA (ICD-729.1) Assessment: Unchanged  Her updated medication list for this problem includes:    Oxycontin 40 Mg Tb12 (Oxycodone hcl) .[name]... 1 by mouth three times a day    Oxycodone Hcl 15 Mg Tabs (Oxycodone hcl) .[name]... 1 qid as needed pain.    Meperidine Hcl 50 Mg Tabs (Meperidine hcl) .[name]... 1 by mouth two times a day as needed severe pain breakthrough  Problem # 6:  DEPRESSION (ICD-311) Assessment: Deteriorated  She declined psychol. consult We will have Verneda to see a psychologist Her updated medication list for this problem includes:    Amitriptyline Hcl 50 Mg Tabs (Amitriptyline hcl) .[name]... 1 by mouth q hs    Citalopram Hydrobromide 20 Mg Tabs (Citalopram hydrobromide) .[name]... 1-2 once daily    Lorazepam 2 Mg Tabs (Lorazepam) .[name]... Three times a day prn The office visit took longer than 45 min with patient councelling for more than 50% of the 45 min  Orders: Psychiatry ref Dr Betti Cruz  Complete Medication List: 1)  Adderall 30 Mg Tabs  (Amphetamine-dextroamphetamine) .[name].. 1 by mouth bid 2)  Oxycontin 40 Mg Tb12 (Oxycodone hcl) .[name].. 1 by mouth three times a day 3)  Oxycodone Hcl 15 Mg Tabs (Oxycodone hcl) .[name].. 1 qid as needed pain. 4)  Meperidine Hcl 50 Mg Tabs (Meperidine hcl) .[name].. 1 by mouth two times a day as needed severe pain breakthrough 5)  Tamoxifen Citrate 20 Mg Tabs (Tamoxifen citrate) .[name].. 1 bid 6)  Atenolol 100 Mg Tabs (Atenolol) .[name].. 1  po qam 7)  Prilosec Otc 20 Mg Tbec (Omeprazole magnesium) .... 2 daily 8)  Amitriptyline Hcl 50 Mg Tabs (Amitriptyline hcl) .[name].. 1 by mouth q hs 9)  Citalopram Hydrobromide 20 Mg Tabs (Citalopram hydrobromide) .[name].. 1-2 once daily 10)  Lorazepam 2 Mg Tabs (Lorazepam) .... Three times a day prn 11)  Cortef 10 Mg Tabs (Hydrocortisone) .[name].. 1 two times a day am and lunch 12)  Zomig 5 Mg Tabs (Zolmitriptan) .[name].. 1 once daily as needed migraine 13)  Nasonex 50 Mcg/act Susp (Mometasone furoate) .[name].. 1-2 each nostril qd 14)  Rocaltrol 0.5 Mcg Caps (Calcitriol) .[name].. 1 by mouth bid 15)  Topamax 100 Mg Tabs (Topiramate) .[name].. 1 by mouth bid 16)  Voltaren 1 % Gel (Diclofenac sodium) .... Two times a day  to qid as needed 17)  Ranitidine Hcl 300 Mg Tabs (Ranitidine hcl) .[name].. 1 po qd 18)  Triamcinolone Acetonide 0.1 % Oint (Triamcinolone acetonide) .... Use two times a day prn45 19)  Accu-chek Aviva Strp (Glucose blood) .... Use two times a day or more 20)  Apidra Solostar 100 Unit/ml Soln (Insulin glulisine) .... 4 to 6 units daily as directed 21)  Levemir Flexpen 100 Unit/ml Soln (Insulin detemir) .[name].. 18 u in am and 14-16 u in pm 22)  Amlodipine Besy-benazepril Hcl 5-10 Mg Caps (Amlodipine besy-benazepril hcl) .[name].. 1 by mouth qd 23)  Bd Pen Needle Short U/f 31g X 8 Mm Misc (Insulin pen needle) .... As dirr 24)  Freestyle Lancets Misc (Lancets) .... As dirr 25)  Accu-chek Soft Touch Lancets Misc (Lancets) .... As dirr 26)  Freestyle Test Strp (Glucose blood) .Marland KitchenMarland KitchenMarland Kitchen  Test up to 5 times daily dx:  250.01 27)  Promethazine Hcl 25 Mg Tabs (Promethazine hcl) .[name].. 1-2 by mouth four times a day as needed nausea 28)  Maxalt-mlt 10 Mg Tbdp (Rizatriptan benzoate) .[name].. 1 once daily as needed migraine h/a 29)  Protonix 40 Mg Tbec (Pantoprazole sodium) .[name].. 1 by mouth qam for indigestion  Other Orders: Capillary Blood Glucose/CBG (09811) Psychology Referral (Psychology)  Patient Instructions: 1)  Please schedule a follow-up appointment in 1 month. 2)  Titrate up Insulin by 1 unit a day for goal sugars 120-140 Prescriptions: PROTONIX 40 MG TBEC (PANTOPRAZOLE SODIUM) 1 by mouth qam for indigestion  #30 x 12   Entered and Authorized by:   [name] MD   Signed by:   [name] MD on 10/25/2009   Method used:   Print then Give to Patient   RxID:   [id number] OXYCODONE HCL 15 MG  TABS (OXYCODONE HCL) 1 qid as needed pain.  #120 x 0   Entered and Authorized by:   [name] MD   Signed by:   [name] MD on 10/25/2009   Method used:   Print then Give to Patient   RxID:   [id number] OXYCONTIN 40 MG  TB12 (OXYCODONE HCL) 1 by mouth three times a day  #90 x 0   Entered and Authorized by:   [name] MD   Signed by:   [name] MD on 10/25/2009   Method used:   Print then Give to Patient   RxID:   [id number] ADDERALL 30 MG TABS (AMPHETAMINE-DEXTROAMPHETAMINE) 1 by mouth bid  #60 x 0   Entered and Authorized by:   [name] MD   Signed by:   [name] MD on 10/25/2009   Method used:   Print then Give to Patient   RxID:   [id number] ROCALTROL 0.5 MCG CAPS (CALCITRIOL) 1 by mouth bid  #60 x 12   Entered and Authorized by:   [name] MD   Signed by:   [name] MD on 10/25/2009   Method used:   Print then Give to Patient   RxID:   [id number]

## 2010-04-11 NOTE — Assessment & Plan Note (Signed)
Summary: 1 MO ROV/PROLIA INJ/NWS   Vital Signs:  Patient profile:   65 year old female Height:      62.5 inches Weight:      124 pounds BMI:     22.40 Temp:     98.3 degrees F oral Pulse rate:   84 / minute Pulse rhythm:   regular Resp:     16 per minute BP sitting:   110 / 80  (left arm) Cuff size:   regular  Vitals Entered By: Lanier Prude, Beverly Gust) (December 23, 2009 4:32 PM) CC: f/u Is Patient Diabetic? No   Primary Care Provider:  [name] MD  CC:  f/u.  History of Present Illness: The patient presents for a follow up of FMS pain, anxiety, depression and migraines headaches.   Preventive Screening-Counseling & Management  Alcohol-Tobacco     Smoking Status: current     Smoking Cessation Counseling: yes     Packs/Day: 2.0  Allergies: 1)  ! Sulfa 2)  ! Avelox (Moxifloxacin Hcl) 3)  ! 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, on disability now Married Current Smoker 1 ppd Alcohol use-no Drug use-no Regular exercise-no  Past Medical History: Depression Diabetes mellitus, type I Hypertension Chronic pain FMS Migraines Breast cancer, hx of - right chronic pancreatitis Anxiety adrenal insufficiency Vit D deficiency GERD L 3d finger dislocated 2009 COPD Anemia-NOS Sternum fracture 11/2008  Osteoporosis (Reclast too expensive $900) Fibromyalgia Hx of pancreatitis Barretts Espohagus 2005 Chronic fatigue Osteoporosis  Social History: Packs/Day:  2.0  Review of Systems       The patient complains of headaches and depression.  The patient denies fever, chest pain, dyspnea on exertion, and abdominal pain.    Physical Exam  General:  anxious and less teary NAD Eyes:  vision grossly intact, pupils equal, and  pupils round.   Nose:  External nasal examination shows no deformity or inflammation. Nasal mucosa are pink and moist without lesions or exudates. Mouth:  Oral mucosa and oropharynx without lesions or exudates.  Teeth in good repair. Neck:  No deformities, masses, or tenderness noted. Lungs:  clear bilaterally without rhonchi, wheezes or crackles  Heart:  RRR, no rubs, gallops or murmurs Abdomen:  S/NT Msk:  R foot  toes are cramped Lumbar-sacral spine is tender to palpation over paraspinal muscles and painfull with the ROM   Extremities:  no edema, no erythema  Feet are examined R toe is OK R inner heel with a 1 cm callus medially Neurologic:  alert & oriented X3.   Skin:  no rash B feet ok w/o ulcers  Psych:  Not  tearful, not anxious with less  depressed affect.  not suicidal and not homicidal.     Impression & Recommendations:  Problem # 1:  MENTAL CONFUSION (ICD-298.9) Assessment Improved We will keep decreasing her meds slowly. No driving when taking meds  Problem # 2:  SOMNOLENCE (ICD-780.09) Assessment: Improved As above  Problem # 3:  FATIGUE (ICD-780.79) Assessment: Unchanged The labs were reviewed with the patient.   Problem # 4:  COMMON MIGRAINE (ICD-346.10) Assessment: Unchanged  Her updated medication list for this problem includes:    Oxycontin 40 Mg Tb12 (Oxycodone hcl) .[name]... 1 by mouth two times a day  Oxycodone Hcl 15 Mg Tabs (Oxycodone hcl) .[name]... 1 tid as needed pain.    Atenolol 100 Mg Tabs (Atenolol) .[name]... 1  po qam    Zomig 5 Mg Tabs (Zolmitriptan) .[name]... 1 once daily as needed migraine    Relpax 40 Mg Tabs (Eletriptan hydrobromide) .[name]... 1 by mouth once daily as needed migraine  Problem # 5:  DIABETES MELLITUS, TYPE I (ICD-250.01) Assessment: Unchanged  Her updated medication list for this problem includes:    Apidra Solostar 100 Unit/ml Soln (Insulin glulisine) .Marland KitchenMarland KitchenMarland KitchenMarland Kitchen 4 to 6 units daily as directed    Levemir Flexpen 100 Unit/ml Soln  (Insulin detemir) .Marland KitchenMarland KitchenMarland KitchenMarland Kitchen 18 u in am and 14-16 u in pm    Amlodipine Besy-benazepril Hcl 5-10 Mg Caps (Amlodipine besy-benazepril hcl) .[name]... 1 by mouth qd  Problem # 6:  COPD (ICD-496) Assessment: Unchanged  Problem # 7:  OSTEOPOROSIS (ICD-733.00) Assessment: Deteriorated  Orders: Admin of Therapeutic Inj  intramuscular or subcutaneous (56387) Prolia 60mg  (J3590)  Complete Medication List: 1)  Adderall 20 Mg Tabs (Amphetamine-dextroamphetamine) .[name].. 1 by mouth two times a day am and lunch 2)  Oxycontin 40 Mg Tb12 (Oxycodone hcl) .[name].. 1 by mouth two times a day 3)  Oxycodone Hcl 15 Mg Tabs (Oxycodone hcl) .[name].. 1 tid as needed pain. 4)  Tamoxifen Citrate 20 Mg Tabs (Tamoxifen citrate) .[name].. 1 bid 5)  Atenolol 100 Mg Tabs (Atenolol) .[name].. 1  po qam 6)  Prilosec Otc 20 Mg Tbec (Omeprazole magnesium) .... 2 daily 7)  Amitriptyline Hcl 50 Mg Tabs (Amitriptyline hcl) .[name].. 1 by mouth q hs 8)  Citalopram Hydrobromide 20 Mg Tabs (Citalopram hydrobromide) .[name].. 1-2 once daily 9)  Lorazepam 2 Mg Tabs (Lorazepam) .... Three times a day prn 10)  Cortef 10 Mg Tabs (Hydrocortisone) .[name].. 1 two times a day am and lunch 11)  Zomig 5 Mg Tabs (Zolmitriptan) .[name].. 1 once daily as needed migraine 12)  Nasonex 50 Mcg/act Susp (Mometasone furoate) .[name].. 1-2 each nostril qd 13)  Rocaltrol 0.5 Mcg Caps (Calcitriol) .[name].. 1 by mouth bid 14)  Topamax 100 Mg Tabs (Topiramate) .[name].. 1 by mouth bid 15)  Voltaren 1 % Gel (Diclofenac sodium) .... Two times a day  to qid as needed 16)  Ranitidine Hcl 300 Mg Tabs (Ranitidine hcl) .[name].. 1 po qd 17)  Triamcinolone Acetonide 0.1 % Oint (Triamcinolone acetonide) .... Use two times a day prn45 18)  Accu-chek Aviva Strp (Glucose blood) .... Use two times a day or more 19)  Apidra Solostar 100 Unit/ml Soln (Insulin glulisine) .... 4 to 6 units daily as directed 20)  Levemir Flexpen 100 Unit/ml Soln (Insulin detemir) .[name].. 18 u in am and 14-16 u in pm 21)  Amlodipine Besy-benazepril Hcl  5-10 Mg Caps (Amlodipine besy-benazepril hcl) .[name].. 1 by mouth qd 22)  Bd Pen Needle Short U/f 31g X 8 Mm Misc (Insulin pen needle) .... As dirr 23)  Freestyle Lancets Misc (Lancets) .... As dirr 24)  Accu-chek Soft Touch Lancets Misc (Lancets) .... As dirr 25)  Freestyle Test Strp (Glucose blood) .... Test up to 5 times daily dx: 250.01 26)  Promethazine Hcl 25 Mg Tabs (Promethazine hcl) .[name].. 1-2 by mouth four times a day as needed nausea 27)  Protonix 40 Mg Tbec (Pantoprazole sodium) .[name].. 1 by mouth qam for indigestion 28)  Relpax 40 Mg Tabs (Eletriptan hydrobromide) .[name].. 1 by mouth once daily as needed migraine 29)  Topamax 200 Mg Tabs (Topiramate) .[name].. 1 by mouth two times a  day for migraine prevention  Other Orders: Psychology Referral (Psychology)  Patient Instructions: 1)  Please schedule a follow-up appointment in 1 month. Prescriptions: OXYCODONE HCL 15 MG  TABS (OXYCODONE HCL) 1 tid as needed pain.  #90 x 0   Entered and Authorized by:   [name] MD   Signed by:   [name] MD on 12/23/2009   Method used:   Print then Give to Patient   RxID:   [id number] ADDERALL 20 MG TABS (AMPHETAMINE-DEXTROAMPHETAMINE) 1 by mouth two times a day am and lunch  #60 x 0   Entered and Authorized by:   [name] MD   Signed by:   [name] MD on 12/23/2009   Method used:   Print then Give to Patient   RxID:   [id number] OXYCONTIN 40 MG  TB12 (OXYCODONE HCL) 1 by mouth two times a day  #60 x 0   Entered and Authorized by:   [name] MD   Signed by:   [name] MD on 12/23/2009   Method used:   Print then Give to Patient   RxID:   [id number]    Medication Administration  Injection # 1:    Medication: Prolia 60mg     Diagnosis: OSTEOPOROSIS (ICD-733.00)    Route: SQ    Site: Left arm     Exp Date: 02/10/2011    Lot #: [id number]    Mfr: [email or web address]    Patient tolerated injection without complications    Given by:  Lamar Sprinkles, CMA (December 23, 2009 4:43 PM)  Orders Added: 1)  Admin of Therapeutic Inj  intramuscular or subcutaneous [96372] 2)  Prolia 60mg  [J3590] 3)  Psychology Referral [Psychology] 4)  Est. Patient Level IV [34742]     Medication Administration  Injection # 1:    Medication: Prolia 60mg     Diagnosis: OSTEOPOROSIS (ICD-733.00)    Route: SQ    Site: Left arm     Exp Date: 02/10/2011    Lot #: [id number]    Mfr: [email or web address]    Patient tolerated injection without complications    Given by: Lamar Sprinkles, CMA (December 23, 2009 4:43 PM)  Orders Added: 1)  Admin of Therapeutic Inj  intramuscular or subcutaneous [96372] 2)  Prolia 60mg  [J3590] 3)  Psychology Referral [Psychology] 4)  Est. Patient Level IV [56433]

## 2010-04-11 NOTE — Assessment & Plan Note (Signed)
Summary: 1 MO ROV /NWS  #   Vital Signs:  Patient profile:   65 year old female Height:      62.5 inches Weight:      119 pounds BMI:     21.50 O2 Sat:      96 % on Room air Temp:     98 degrees F oral Pulse rate:   82 / minute BP sitting:   130 / 88  (left arm) Cuff size:   regular  Vitals Entered By: Lucious Groves (July 06, 2009 2:47 PM)  O2 Flow:  Room air CC: 1 mo rtn ov/ C/O blood sugar issues and increased GERD./kb Is Patient Diabetic? Yes Did you bring your meter with you today? No Pain Assessment Patient in pain? no      CBG Result 302 CBG Device ID Freestyle Lite Comments Patient requests omeprazole prescription. Per pt her blood sugar was 50 @ fasting and 250 after breakfast./kb   Primary Care Provider:  [name] MD  CC:  1 mo rtn ov/ C/O blood sugar issues and increased GERD./kb.  History of Present Illness: The patient presents for a follow up of hypertension, diabetes, hyperlipidemia, HAs, fatigue.  Current Medications (verified): 1)  Adderall 30 Mg Tabs (Amphetamine-Dextroamphetamine) .[name].. 1 By Mouth Bid 2)  Oxycontin 40 Mg  Tb12 (Oxycodone Hcl) .[name].. 1 By Mouth Three Times A Day 3)  Oxycodone Hcl 15 Mg  Tabs (Oxycodone Hcl) .[name].. 1 Qid As Needed Pain. 4)  Tamoxifen Citrate 20 Mg  Tabs (Tamoxifen Citrate) .[name].. 1 Bid 5)  Atenolol 100 Mg Tabs (Atenolol) .[name].. 1  Po Qam 6)  Prilosec Otc 20 Mg Tbec (Omeprazole Magnesium) .... 2 Daily 7)  Citalopram Hydrobromide 20 Mg Tabs (Citalopram Hydrobromide) .[name].. 1-2 Once Daily 8)  Lorazepam 2 Mg Tabs (Lorazepam) .... Three Times A Day Prn 9)  Cortef 10 Mg  Tabs (Hydrocortisone) .[name].. 1 Two Times A Day Am and Lunch 10)  Zomig 5 Mg  Tabs (Zolmitriptan) .[name].. 1 Once Daily As Needed Migraine 11)  Nasonex 50 Mcg/act Susp (Mometasone Furoate) .[name].. 1-2 Each Nostril Qd 12)  Rocaltrol 0.5 Mcg Caps (Calcitriol) .[name].. 1 By Mouth Qd 13)  Topamax 100 Mg  Tabs (Topiramate) .[name].. 1 By Mouth Bid 14)  Voltaren 1 %  Gel  (Diclofenac Sodium) .... Two Times A Day  To Qid As Needed 15)  Ranitidine Hcl 300 Mg Tabs (Ranitidine Hcl) .[name].. 1 Po Qd 16)  Triamcinolone Acetonide 0.1 % Oint (Triamcinolone Acetonide) .... Use Two Times A Day Prn45 17)  Accu-Chek Aviva  Strp (Glucose Blood) .... Use Two Times A Day or More 18)  Bd U/f Original Pen Needle 29g X 12.25mm Misc (Insulin Pen Needle) .... Use Two Times A Day 19)  Apidra Solostar 100 Unit/ml Soln (Insulin Glulisine) .... As Dirr 20)  Levemir Flexpen 100 Unit/ml  Soln (Insulin Detemir) .[name].. 18 U in Am and 14-16 U in Pm 21)  Meperidine Hcl 50 Mg Tabs (Meperidine Hcl) .[name].. 1 By Mouth Two Times A Day As Needed Severe Pain Breakthrough 22)  Amitriptyline Hcl 50 Mg Tabs (Amitriptyline Hcl) .[name].. 1 By Mouth Q Hs 23)  Prochlorperazine Maleate 10 Mg Tabs (Prochlorperazine Maleate) .[name].. 1 By Mouth Two Times A Day As Needed Nausea  Allergies (verified): 1)  ! Sulfa 2)  ! Avelox (Moxifloxacin Hcl) 3)  ! Effexor 4)  Savella (Milnacipran Hcl) 5)  Duragesic-50 (Fentanyl)  Past History:  Past Medical History: Last updated: 04/12/2009 Depression Diabetes  mellitus, type I Hypertension Chronic pain FMS Migraines Breast cancer, hx of - right chronic pancreatitis Anxiety adrenal insufficiency Vit D deficiency GERD L 3d finger dislocated 2009 COPD Anemia-NOS Sternum fracture 11/2008  Osteoporosis (Reclast too expensive $900)  Past Surgical History: Last updated: 05/09/2007 Sinus surgery 40% Part Pancreatecomty Cholecystectomy  Social History: Last updated: 09/25/2007 Did not go back to work Oct 23, [name] is very ill - unable to have financial ends meet, no income 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  The patient denies fever, chest pain, and abdominal pain.    Physical Exam  General:  Less anxious and less  teary NAD Nose:  External nasal examination shows no deformity or inflammation. Nasal mucosa are pink and moist without lesions or exudates. Mouth:  Oral mucosa and oropharynx without lesions or exudates.  Teeth in good repair. Neck:  No deformities, masses, or tenderness noted. Lungs:  clear bilaterally without rhonchi, wheezes or crackles  Heart:  RRR, no rubs, gallops or murmurs Abdomen:  S/NT Msk:  Lumbar-sacral spine is tender to palpation over paraspinal muscles and painfull with the ROM   Extremities:  no edema, no erythema  Neurologic:  alert & oriented X3.   Skin:  no rash L heel w/1x3 cm non tenderhealed ulcer Psych:  Not  tearful, not anxious with less  depressed affect.     Impression & Recommendations:  Problem # 1:  OSTEOPOROSIS (ICD-733.00) Assessment Unchanged Vit D  Problem # 2:  CHEST PAIN (ICD-786.50) post Fx Assessment: Improved  Problem # 3:  FRACTURE, STERNUM (ICD-807.2) Assessment: Improved  Problem # 4:  FATIGUE (ICD-780.79) Assessment: Unchanged  Problem # 5:  DIABETES MELLITUS, TYPE I (ICD-250.01) Assessment: Unchanged  Her updated medication list for this problem includes:    Apidra Solostar 100 Unit/ml Soln (Insulin glulisine) .Marland KitchenMarland KitchenMarland KitchenMarland Kitchen 4 to 6 units daily as directed    Levemir Flexpen 100 Unit/ml Soln (Insulin detemir) .Marland KitchenMarland KitchenMarland KitchenMarland Kitchen 18 u in am and 14-16 u in pm  Problem # 6:  GERD (ICD-530.81) Assessment: Deteriorated  Her updated medication list for this problem includes:    Prilosec Otc 20 Mg Tbec (Omeprazole magnesium) .[name]... 2 daily    Ranitidine Hcl 300 Mg Tabs (Ranitidine hcl) .[name]... 1 po qd    Omeprazole 40 Mg Cpdr (Omeprazole) .[name]... 1 by mouth qam for indigestion  Orders: Gastroenterology Referral (GI) Dr Arlyce Dice  Problem # 7:  MIGRAINE VARIANTS, W/INTRACTABLE MIGRAINE (ICD-346.21) Assessment: Unchanged  Her updated medication list for this problem includes:    Oxycontin 40 Mg Tb12 (Oxycodone hcl) .[name]... 1 by mouth three times a day     Oxycodone Hcl 15 Mg Tabs (Oxycodone hcl) .[name]... 1 qid as needed pain.    Atenolol 100 Mg Tabs (Atenolol) .[name]... 1  po qam    Zomig 5 Mg Tabs (Zolmitriptan) .[name]... 1 once daily as needed migraine    Meperidine Hcl 50 Mg Tabs (Meperidine hcl) .[name]... 1 by mouth two times a day as needed severe pain breakthrough The office visit took longer than 45 min with patient councelling for more than 50% of the 45 min   Complete Medication List: 1)  Adderall 30 Mg Tabs (Amphetamine-dextroamphetamine) .[name].. 1 by mouth bid 2)  Oxycontin 40 Mg Tb12 (Oxycodone hcl) .[name].. 1 by mouth three times a day 3)  Oxycodone Hcl 15 Mg Tabs (Oxycodone hcl) .Marland KitchenMarland KitchenMarland Kitchen  1 qid as needed pain. 4)  Tamoxifen Citrate 20 Mg Tabs (Tamoxifen citrate) .[name].. 1 bid 5)  Atenolol 100 Mg Tabs (Atenolol) .[name].. 1  po qam 6)  Prilosec Otc 20 Mg Tbec (Omeprazole magnesium) .... 2 daily 7)  Citalopram Hydrobromide 20 Mg Tabs (Citalopram hydrobromide) .[name].. 1-2 once daily 8)  Lorazepam 2 Mg Tabs (Lorazepam) .... Three times a day prn 9)  Cortef 10 Mg Tabs (Hydrocortisone) .[name].. 1 two times a day am and lunch 10)  Zomig 5 Mg Tabs (Zolmitriptan) .[name].. 1 once daily as needed migraine 11)  Nasonex 50 Mcg/act Susp (Mometasone furoate) .[name].. 1-2 each nostril qd 12)  Rocaltrol 0.5 Mcg Caps (Calcitriol) .[name].. 1 by mouth qd 13)  Topamax 100 Mg Tabs (Topiramate) .[name].. 1 by mouth bid 14)  Voltaren 1 % Gel (Diclofenac sodium) .... Two times a day  to qid as needed 15)  Ranitidine Hcl 300 Mg Tabs (Ranitidine hcl) .[name].. 1 po qd 16)  Triamcinolone Acetonide 0.1 % Oint (Triamcinolone acetonide) .... Use two times a day prn45 17)  Accu-chek Aviva Strp (Glucose blood) .... Use two times a day or more 18)  Apidra Solostar 100 Unit/ml Soln (Insulin glulisine) .... 4 to 6 units daily as directed 19)  Levemir Flexpen 100 Unit/ml Soln (Insulin detemir) .[name].. 18 u in am and 14-16 u in pm 20)  Meperidine Hcl 50 Mg Tabs (Meperidine hcl) .[name].. 1 by mouth two times a day as needed severe pain  breakthrough 21)  Amitriptyline Hcl 50 Mg Tabs (Amitriptyline hcl) .[name].. 1 by mouth q hs 22)  Prochlorperazine Maleate 10 Mg Tabs (Prochlorperazine maleate) .[name].. 1 by mouth two times a day as needed nausea 23)  Omeprazole 40 Mg Cpdr (Omeprazole) .[name].. 1 by mouth qam for indigestion 24)  Relion Pen Needles 31g X 8 Mm Misc (Insulin pen needle) .... Use bid  Patient Instructions: 1)  Please schedule a follow-up appointment in 1 month. 2)  BMP prior to visit, ICD-9: 3)  HbgA1C prior to visit, ICD-9: Prescriptions: RELION PEN NEEDLES 31G X 8 MM MISC (INSULIN PEN NEEDLE) use bid  #100 x 6   Entered and Authorized by:   [name] MD   Signed by:   [name] MD on 07/06/2009   Method used:   Print then Give to Patient   RxID:   [id number] APIDRA SOLOSTAR 100 UNIT/ML SOLN (INSULIN GLULISINE) as dirr  #5 x 12   Entered and Authorized by:   [name] MD   Signed by:   [name] MD on 07/06/2009   Method used:   Print then Give to Patient   RxID:   [id number] LEVEMIR FLEXPEN 100 UNIT/ML  SOLN (INSULIN DETEMIR) 18 u in am and 14-16 u in pm  #5 x 12   Entered and Authorized by:   [name] MD   Signed by:   [name] MD on 07/06/2009   Method used:   Print then Give to Patient   RxID:   [id number] MEPERIDINE HCL 50 MG TABS (MEPERIDINE HCL) 1 by mouth two times a day as needed severe pain breakthrough  #20 x 0   Entered and Authorized by:   [name] MD   Signed by:   [name] MD on 07/06/2009   Method used:   Print then Give to Patient   RxID:   [id number] OXYCODONE HCL 15 MG  TABS (OXYCODONE HCL) 1 qid as needed pain.  #  120 x 0   Entered and Authorized by:   [name] MD   Signed by:   [name] MD on 07/06/2009   Method used:   Print then Give to Patient   RxID:   [id number] ADDERALL 30 MG TABS (AMPHETAMINE-DEXTROAMPHETAMINE) 1 by mouth bid  #60 x 0   Entered and  Authorized by:   [name] MD   Signed by:   [name] MD on 07/06/2009   Method used:   Print then Give to Patient   RxID:   [id number] OXYCONTIN 40 MG  TB12 (OXYCODONE HCL) 1 by mouth three times a day  #90 x 0   Entered and Authorized by:   [name] MD   Signed by:   [name] MD on 07/06/2009   Method used:   Print then Give to Patient   RxID:   [id number] OMEPRAZOLE 40 MG CPDR (OMEPRAZOLE) 1 by mouth qam for indigestion  #90 x 3   Entered and Authorized by:   [name] MD   Signed by:   [name] MD on 07/06/2009   Method used:   Electronically to        [facility]* (retail)       [address]       [place], [place]  [id number]       Ph: [phone]       Fax: [phone]   RxID:   [id number]

## 2010-04-11 NOTE — Progress Notes (Signed)
Summary: Declined Psychology referral  Phone Note From Other Clinic   Action Taken: Provider Notified Summary of Call: Dr Posey Rea, [facility]) called pt who want to see a psychiatry doctor instead of a psychologist. Need new referral.  Thanks Initial call taken by: Dagoberto Reef,  October 26, 2009 2:34 PM  Follow-up for Phone Call        ok Dr Betti Cruz Follow-up by: [name] MD,  October 26, 2009 6:03 PM  Additional Follow-up for Phone Call Additional follow up Details #1::        Pt is aware she has to self schedule, gave pt Dr Betti Cruz phone #[phone]. Additional Follow-up by: Dagoberto Reef,  October 27, 2009 2:10 PM

## 2010-04-11 NOTE — Medication Information (Signed)
Summary: Zomig DENIED/[facility]  Zomig DENIED/[facility]   Imported By: Sherian Rein 09/30/2009 10:53:54  _____________________________________________________________________  External Attachment:    Type:   Image     Comment:   External Document  Appended Document: Zomig DENIED/[facility] noted Form filled out

## 2010-04-11 NOTE — Progress Notes (Signed)
Summary: Directions  Phone Note Call from Patient   Summary of Call: Pt says she does not remember what MD told pt about how to change from previous depression med to new vibryd.  Initial call taken by: Lamar Sprinkles, CMA,  January 27, 2010 11:26 AM  Follow-up for Phone Call        Stop Citalopram. Start Viibrid the next day. Take 10 mg once daily x 3d, then 20 mg once daily x 3 d, then 40 mg qd Follow-up by: [name] MD,  January 27, 2010 1:32 PM  Additional Follow-up for Phone Call Additional follow up Details #1::        Patient notified.Alvy Beal Archie CMA  January 27, 2010 2:41 PM

## 2010-04-11 NOTE — Progress Notes (Signed)
Summary: Adderall PA  Phone Note From Pharmacy   Caller: [facility] [phone] Call For: ID:  [id number]  ID:  [id number]  Summary of Call: PA request--Generic Adderall. Ok to proceed? Initial call taken by: Lucious Groves,  April 14, 2009 9:36 AM  Follow-up for Phone Call        /;OK Thx Follow-up by: [name] MD,  April 14, 2009 12:54 PM  Additional Follow-up for Phone Call Additional follow up Details #1::        They will fax forms. Additional Follow-up by: Lucious Groves,  April 14, 2009 3:55 PM    Additional Follow-up for Phone Call Additional follow up Details #2::    I did not receive fax form, requested again. Lucious Groves  April 15, 2009 12:00 PM   Awaiting MD completion. Lucious Groves  April 15, 2009 3:26 PM   [place], [place] Request was denied. Please advise............................Marland KitchenLamar Sprinkles, CMA  April 21, 2009 10:04 AM   Additional Follow-up for Phone Call Additional follow up Details #3:: Details for Additional Follow-up Action Taken: What is their alternative meds? Georgina Quint Plotnikov MD  April 21, 2009 12:33 PM    spoke with [facility], they do not have any alternative meds. what do you suggest? Please Advise Ami Bullins CMA  April 22, 2009 2:05 PM  Pls try to approve for a Dx "Depression, apathy, somnolence" Thx Georgina Quint Plotnikov MD  April 22, 2009 5:31 PM  [facility] rep left vm for [place] to call back with Case # [id number]....................Marland KitchenLamar Sprinkles, CMA  April 22, 2009 5:52 PM   Per [facility], in order to try again for coverage our office must submit a dictated and signed appeal and mail to:  Pharmacy Appeals Coordinator Attn: Appeals by mouth [address] [address] [place], [place] [address] Lucious Groves  April 25, 2009 8:35 AM  OK [name] MD  April 26, 2009 5:37 PM      Mailed. Lucious Groves  April 27, 2009 8:35 AM

## 2010-04-11 NOTE — Progress Notes (Signed)
Summary: PA-Adderall  Phone Note From Pharmacy   Summary of Call: PA-Adderall ,called  [facility] @ [phone] approved 05/06/09-05/09/09, case # [id number]. Initial call taken by: Dagoberto Reef,  December 20, 2009 4:31 PM

## 2010-04-11 NOTE — Assessment & Plan Note (Signed)
Summary: [name] APPT PER PT/CD   Vital Signs:  Patient profile:   65 year old female Height:      62.5 inches (158.75 cm) Weight:      126 pounds (57.27 kg) BMI:     22.76 O2 Sat:      93 % on Room air Temp:     99.0 degrees F (37.22 degrees C) oral Pulse rate:   84 / minute Pulse rhythm:   regular Resp:     16 per minute BP sitting:   110 / 74  (left arm) Cuff size:   regular  Vitals Entered By: Lanier Prude, CMA(AAMA) (September 26, 2009 4:25 PM)  O2 Flow:  Room air Is Patient Diabetic? Yes Comments needs rf on Adderall 30mg , Oxycontin 40mg , Oxycodone 15mg  and MeperidineHCL 50mg   and Promethazine 25mg  1 q6 hours   Primary Care Provider:  [name] MD   History of Present Illness: The patient presents for a follow up of DM, anxiety, depression and headaches. HAs are worse w/occ n/v.   Current Medications (verified): 1)  Adderall 30 Mg Tabs (Amphetamine-Dextroamphetamine) .[name].. 1 By Mouth Bid 2)  Oxycontin 40 Mg  Tb12 (Oxycodone Hcl) .[name].. 1 By Mouth Three Times A Day 3)  Oxycodone Hcl 15 Mg  Tabs (Oxycodone Hcl) .[name].. 1 Qid As Needed Pain. 4)  Tamoxifen Citrate 20 Mg  Tabs (Tamoxifen Citrate) .[name].. 1 Bid 5)  Atenolol 100 Mg Tabs (Atenolol) .[name].. 1  Po Qam 6)  Prilosec Otc 20 Mg Tbec (Omeprazole Magnesium) .... 2 Daily 7)  Citalopram Hydrobromide 20 Mg Tabs (Citalopram Hydrobromide) .[name].. 1-2 Once Daily 8)  Lorazepam 2 Mg Tabs (Lorazepam) .... Three Times A Day Prn 9)  Cortef 10 Mg  Tabs (Hydrocortisone) .[name].. 1 Two Times A Day Am and Lunch 10)  Zomig 5 Mg  Tabs (Zolmitriptan) .[name].. 1 Once Daily As Needed Migraine 11)  Nasonex 50 Mcg/act Susp (Mometasone Furoate) .[name].. 1-2 Each Nostril Qd 12)  Rocaltrol 0.5 Mcg Caps (Calcitriol) .[name].. 1 By Mouth Qd 13)  Topamax 100 Mg  Tabs (Topiramate) .[name].. 1 By Mouth Bid 14)  Voltaren 1 %  Gel (Diclofenac Sodium) .... Two Times A Day  To Qid As Needed 15)  Ranitidine Hcl 300 Mg Tabs (Ranitidine Hcl) .[name].. 1 Po Qd 16)  Triamcinolone Acetonide  0.1 % Oint (Triamcinolone Acetonide) .... Use Two Times A Day Prn45 17)  Accu-Chek Aviva  Strp (Glucose Blood) .... Use Two Times A Day or More 18)  Apidra Solostar 100 Unit/ml Soln (Insulin Glulisine) .... 4 To 6 Units Daily As Directed 19)  Levemir Flexpen 100 Unit/ml  Soln (Insulin Detemir) .[name].. 18 U in Am and 14-16 U in Pm 20)  Meperidine Hcl 50 Mg Tabs (Meperidine Hcl) .[name].. 1 By Mouth Two Times A Day As Needed Severe Pain Breakthrough 21)  Amitriptyline Hcl 50 Mg Tabs (Amitriptyline Hcl) .[name].. 1 By Mouth Q Hs 22)  Prochlorperazine Maleate 10 Mg Tabs (Prochlorperazine Maleate) .[name].. 1 By Mouth Two Times A Day As Needed Nausea 23)  Omeprazole 40 Mg Cpdr (Omeprazole) .[name].. 1 By Mouth Qam For Indigestion 24)  Amlodipine Besy-Benazepril Hcl 5-10 Mg Caps (Amlodipine Besy-Benazepril Hcl) .[name].. 1 By Mouth Qd 25)  Bd Pen Needle Short U/f 31g X 8 Mm Misc (Insulin Pen Needle) .... As Dirr 26)  Freestyle Lancets  Misc (Lancets) .... As Dirr 27)  Accu-Chek Soft Touch Lancets  Misc (Lancets) .... As Dirr 28)  Freestyle Test  Strp (Glucose Blood) .Marland KitchenMarland KitchenMarland Kitchen  Test Up To 5 Times Daily Dx: 250.01  Allergies (verified): 1)  ! Sulfa 2)  ! Avelox (Moxifloxacin Hcl) 3)  ! Effexor 4)  Savella (Milnacipran Hcl) 5)  Duragesic-50 (Fentanyl)  Past History:  Past Medical History: Last updated: 08/10/2009 Depression Diabetes mellitus, type I Hypertension Chronic pain FMS Migraines Breast cancer, hx of - right chronic pancreatitis Anxiety adrenal insufficiency Vit D deficiency GERD L 3d finger dislocated 2009 COPD Anemia-NOS Sternum fracture 11/2008  Osteoporosis (Reclast too expensive $900) Fibromyalgia Hx of pancreatitis Barretts Espohagus 2005 Chronic fatigue  Social History: Last updated: 08/29/2009 Did not go back to work Oct 23, [name] is very ill - unable to have financial ends meet, no income 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    Physical Exam  General:  Less anxious and less teary NAD Head:  R scull base posteriorly is a little tender Mouth:  Oral mucosa and oropharynx without lesions or exudates.  Teeth in good repair. Neck:  No deformities, masses, or tenderness noted. Lungs:  clear bilaterally without rhonchi, wheezes or crackles  Heart:  RRR, no rubs, gallops or murmurs Abdomen:  S/NT Msk:  Lumbar-sacral spine is tender to palpation over paraspinal muscles and painfull with the ROM   Neurologic:  alert & oriented X3.   Skin:  no rash B feet ok R hand 2 mm splinter Psych:  Not  tearful, not anxious with less  depressed affect.     Impression & Recommendations:  Problem # 1:  DIABETES MELLITUS, TYPE I (ICD-250.01) Assessment Unchanged  Her updated medication list for this problem includes:    Apidra Solostar 100 Unit/ml Soln (Insulin glulisine) .Marland KitchenMarland KitchenMarland KitchenMarland Kitchen 4 to 6 units daily as directed    Levemir Flexpen 100 Unit/ml Soln (Insulin detemir) .Marland KitchenMarland KitchenMarland KitchenMarland Kitchen 18 u in am and 14-16 u in pm    Amlodipine Besy-benazepril Hcl 5-10 Mg Caps (Amlodipine besy-benazepril hcl) .[name]... 1 by mouth qd  Problem # 2:  MIGRAINE VARIANTS, W/INTRACTABLE MIGRAINE (ICD-346.21) Assessment: Deteriorated  Her updated medication list for this problem includes:    Oxycontin 40 Mg Tb12 (Oxycodone hcl) .[name]... 1 by mouth three times a day    Oxycodone Hcl 15 Mg Tabs (Oxycodone hcl) .[name]... 1 qid as needed pain.    Atenolol 100 Mg Tabs (Atenolol) .[name]... 1  po qam    Zomig 5 Mg Tabs (Zolmitriptan) .[name]... 1 once daily as needed migraine    Meperidine Hcl 50 Mg Tabs (Meperidine hcl) .[name]... 1 by mouth two times a day as needed severe pain breakthrough  Problem # 3:  ANXIETY (ICD-300.00) Assessment: Unchanged  Her updated medication list for this problem includes:    Amitriptyline Hcl 50 Mg Tabs  (Amitriptyline hcl) .[name]... 1 by mouth q hs    Citalopram Hydrobromide 20 Mg Tabs (Citalopram hydrobromide) .[name]... 1-2 once daily    Lorazepam 2 Mg Tabs (Lorazepam) .[name]... Three times a day prn  Problem # 4:  DEPRESSION (ICD-311) Assessment: Unchanged  Her updated medication list for this problem includes:    Amitriptyline Hcl 50 Mg Tabs (Amitriptyline hcl) .[name]... 1 by mouth q hs    Citalopram Hydrobromide 20 Mg Tabs (Citalopram hydrobromide) .[name]... 1-2 once daily    Lorazepam 2 Mg Tabs (Lorazepam) .[name]... Three times a day prn  Complete Medication List: 1)  Adderall 30  Mg Tabs (Amphetamine-dextroamphetamine) .[name].. 1 by mouth bid 2)  Oxycontin 40 Mg Tb12 (Oxycodone hcl) .[name].. 1 by mouth three times a day 3)  Oxycodone Hcl 15 Mg Tabs (Oxycodone hcl) .[name].. 1 qid as needed pain. 4)  Tamoxifen Citrate 20 Mg Tabs (Tamoxifen citrate) .[name].. 1 bid 5)  Atenolol 100 Mg Tabs (Atenolol) .[name].. 1  po qam 6)  Prilosec Otc 20 Mg Tbec (Omeprazole magnesium) .... 2 daily 7)  Amitriptyline Hcl 50 Mg Tabs (Amitriptyline hcl) .[name].. 1 by mouth q hs 8)  Citalopram Hydrobromide 20 Mg Tabs (Citalopram hydrobromide) .[name].. 1-2 once daily 9)  Lorazepam 2 Mg Tabs (Lorazepam) .... Three times a day prn 10)  Cortef 10 Mg Tabs (Hydrocortisone) .[name].. 1 two times a day am and lunch 11)  Zomig 5 Mg Tabs (Zolmitriptan) .[name].. 1 once daily as needed migraine 12)  Nasonex 50 Mcg/act Susp (Mometasone furoate) .[name].. 1-2 each nostril qd 13)  Rocaltrol 0.5 Mcg Caps (Calcitriol) .[name].. 1 by mouth qd 14)  Topamax 100 Mg Tabs (Topiramate) .[name].. 1 by mouth bid 15)  Voltaren 1 % Gel (Diclofenac sodium) .... Two times a day  to qid as needed 16)  Ranitidine Hcl 300 Mg Tabs (Ranitidine hcl) .[name].. 1 po qd 17)  Triamcinolone Acetonide 0.1 % Oint (Triamcinolone acetonide) .... Use two times a day prn45 18)  Accu-chek Aviva Strp (Glucose blood) .... Use two times a day or more 19)  Apidra Solostar 100 Unit/ml Soln (Insulin glulisine) .... 4 to 6 units daily as  directed 20)  Levemir Flexpen 100 Unit/ml Soln (Insulin detemir) .[name].. 18 u in am and 14-16 u in pm 21)  Meperidine Hcl 50 Mg Tabs (Meperidine hcl) .[name].. 1 by mouth two times a day as needed severe pain breakthrough 22)  Omeprazole 40 Mg Cpdr (Omeprazole) .[name].. 1 by mouth qam for indigestion 23)  Amlodipine Besy-benazepril Hcl 5-10 Mg Caps (Amlodipine besy-benazepril hcl) .[name].. 1 by mouth qd 24)  Bd Pen Needle Short U/f 31g X 8 Mm Misc (Insulin pen needle) .... As dirr 25)  Freestyle Lancets Misc (Lancets) .... As dirr 26)  Accu-chek Soft Touch Lancets Misc (Lancets) .... As dirr 27)  Freestyle Test Strp (Glucose blood) .... Test up to 5 times daily dx: 250.01 28)  Promethazine Hcl 25 Mg Tabs (Promethazine hcl) .[name].. 1-2 by mouth four times a day as needed nausea  Patient Instructions: 1)  Please schedule a follow-up appointment in 1 month. 2)  BMP prior to visit, ICD-9: 3)  TSH prior to visit, ICD-9: 4)  CBC w/ Diff prior to visit, ICD-9: 5)  HbgA1C prior to visit, ICD-9: 995.20 250.00 Prescriptions: PROMETHAZINE HCL 25 MG TABS (PROMETHAZINE HCL) 1-2 by mouth four times a day as needed nausea  #60 x 3   Entered and Authorized by:   [name] MD   Signed by:   [name] MD on 09/26/2009   Method used:   Print then Give to Patient   RxID:   [id number] MEPERIDINE HCL 50 MG TABS (MEPERIDINE HCL) 1 by mouth two times a day as needed severe pain breakthrough  #20 x 0   Entered and Authorized by:   [name] MD   Signed by:   [name] MD on 09/26/2009   Method used:   Print then Give to Patient   RxID:   [id number] OXYCODONE HCL 15 MG  TABS (OXYCODONE HCL) 1 qid as needed pain.  #120 x 0   Entered  and Authorized by:   [name] MD   Signed by:   [name] MD on 09/26/2009   Method used:   Print then Give to Patient   RxID:   [id number] OXYCONTIN 40 MG  TB12 (OXYCODONE HCL) 1 by mouth three times a day  #90 x 0    Entered and Authorized by:   [name] MD   Signed by:   [name] MD on 09/26/2009   Method used:   Print then Give to Patient   RxID:   [id number] ADDERALL 30 MG TABS (AMPHETAMINE-DEXTROAMPHETAMINE) 1 by mouth bid  #60 x 0   Entered and Authorized by:   [name] MD   Signed by:   [name] MD on 09/26/2009   Method used:   Print then Give to Patient   RxID:   [id number]

## 2010-04-11 NOTE — Progress Notes (Signed)
Summary: Prolia  Phone Note Outgoing Call   Summary of Call: I had a flag reminding me the patient prolia inj is due 11-10-09. Called patient to discuss, left message on machine to call back to office.  Lucious Groves CMA,  November 07, 2009 4:41 PM  left message on machine to call back to office. Lucious Groves CMA  November 10, 2009 1:28 PM   Follow-up for Phone Call        Patient notified and plans to get Prolia on the appt she has scheduled for the 16th.  *Closed phone note. Follow-up by: Lucious Groves CMA,  November 19, 2009 11:52 AM  Additional Follow-up for Phone Call Additional follow up Details #1::        Agree. Thx. Pls order Prolia for 9/16 Additional Follow-up by: [name] MD,  November 21, 2009 4:32 PM    Additional Follow-up for Phone Call Additional follow up Details #2::    Done/Ordered.Marland KitchenMarland KitchenAlvy Beal Archie CMA  November 22, 2009 1:58 PM

## 2010-04-11 NOTE — Progress Notes (Signed)
Summary: REFILL   Phone Note From Pharmacy   Caller: [facility] Summary of Call: Pharm is req refill of hydrocortisone 10mg  1 two times a day #60. Not on EMR list, please advise.  Initial call taken by: Lamar Sprinkles, CMA,  November 04, 2009 5:12 PM  Follow-up for Phone Call        It is Cortef - ok x 6 ref Follow-up by: [name] MD,  November 04, 2009 5:14 PM    Prescriptions: CORTEF 10 MG  TABS (HYDROCORTISONE) 1 two times a day am and lunch  #60 x 6   Entered by:   Lamar Sprinkles, CMA   Authorized by:   [name] MD   Signed by:   Lamar Sprinkles, CMA on 11/04/2009   Method used:   Electronically to        [facility]* (retail)       [address]       [place], [place]  [id number]       Ph: [phone]       Fax: [phone]   RxID:   [id number]

## 2010-04-11 NOTE — Progress Notes (Signed)
Summary: Adderall  Phone Note From Other Clinic   Caller: Cheyenne Adas Summary of Call: Patient prescription was denied due to checking yes that there is contraindication. I clarified that there is not, that the patient can/does take the Adderall and it has been changed and re-submitted for review. Initial call taken by: Lucious Groves,  May 06, 2009 11:10 AM  Follow-up for Phone Call        OK Thx Follow-up by: [name] MD,  May 06, 2009 12:49 PM

## 2010-04-11 NOTE — Progress Notes (Signed)
Summary: PA-Omeprazole  Phone Note From Pharmacy   Summary of Call: PA-Omeprazole , insurance will allow no futher fills until 12/2009, pt last refrill 07/06/09 90 x 3. Please advise if prior auth is need, Thanks Initial call taken by: Dagoberto Reef,  October 12, 2009 12:03 PM  Follow-up for Phone Call        pls go ahead with PA Follow-up by: [name] MD,  October 17, 2009 8:00 AM    Additional Follow-up for Phone Call Additional follow up Details #2::    Medication was changed to Maxalt-mlt 10mg . Follow-up by: Dagoberto Reef,  October 20, 2009 3:58 PM

## 2010-04-11 NOTE — Progress Notes (Signed)
  Phone Note From Other Clinic   Summary of Call: Pt is at [facility] to see Dr Luiz Blare now and they are req that Dr Posey Rea see pt due to vomiting & diarrhea - possibly med reaction? OK PER MD Initial call taken by: Lamar Sprinkles, CMA,  February 13, 2010 2:56 PM

## 2010-04-11 NOTE — Assessment & Plan Note (Signed)
Summary: 1 MO ROV / NWS  #   Vital Signs:  Patient profile:   65 year old female Height:      62.5 inches Weight:      124 pounds Temp:     98.1 degrees F oral Pulse rate:   80 / minute Pulse rhythm:   regular Resp:     16 per minute BP sitting:   120 / 72  (left arm) Cuff size:   regular  Vitals Entered By: Lanier Prude, Beverly Gust) (January 24, 2010 3:32 PM) CC: 1 mo f/u Comments pt states she is not taking Adderall, Voltaren or Triamcinolon cream. Please remove   Primary Care Bryley Chrisman:  [name] MD  CC:  1 mo f/u.  History of Present Illness: The patient presents for a follow up of back pain, anxiety, depression (worse) and headaches, DM. Can't focus on anything   Current Medications (verified): 1)  Adderall 20 Mg Tabs (Amphetamine-Dextroamphetamine) .[name].. 1 By Mouth Two Times A Day Am and Lunch 2)  Oxycontin 40 Mg  Tb12 (Oxycodone Hcl) .[name].. 1 By Mouth Two Times A Day 3)  Oxycodone Hcl 15 Mg  Tabs (Oxycodone Hcl) .[name].. 1 Tid As Needed Pain. 4)  Tamoxifen Citrate 20 Mg  Tabs (Tamoxifen Citrate) .[name].. 1 Bid 5)  Atenolol 100 Mg Tabs (Atenolol) .[name].. 1  Po Qam 6)  Prilosec Otc 20 Mg Tbec (Omeprazole Magnesium) .... 2 Daily 7)  Amitriptyline Hcl 50 Mg Tabs (Amitriptyline Hcl) .[name].. 1 By Mouth Q Hs 8)  Citalopram Hydrobromide 20 Mg Tabs (Citalopram Hydrobromide) .[name].. 1-2 Once Daily 9)  Lorazepam 2 Mg Tabs (Lorazepam) .... Three Times A Day Prn 10)  Cortef 10 Mg  Tabs (Hydrocortisone) .[name].. 1 Two Times A Day Am and Lunch 11)  Zomig 5 Mg  Tabs (Zolmitriptan) .[name].. 1 Once Daily As Needed Migraine 12)  Nasonex 50 Mcg/act Susp (Mometasone Furoate) .[name].. 1-2 Each Nostril Qd 13)  Rocaltrol 0.5 Mcg Caps (Calcitriol) .[name].. 1 By Mouth Bid 14)  Topamax 100 Mg  Tabs (Topiramate) .[name].. 1 By Mouth Bid 15)  Voltaren 1 %  Gel (Diclofenac Sodium) .... Two Times A Day  To Qid As Needed 16)  Ranitidine Hcl 300 Mg Tabs (Ranitidine Hcl) .[name].. 1 Po Qd 17)  Triamcinolone Acetonide 0.1 % Oint  (Triamcinolone Acetonide) .... Use Two Times A Day Prn45 18)  Accu-Chek Aviva  Strp (Glucose Blood) .... Use Two Times A Day or More 19)  Apidra Solostar 100 Unit/ml Soln (Insulin Glulisine) .... 4 To 6 Units Daily As Directed 20)  Levemir Flexpen 100 Unit/ml  Soln (Insulin Detemir) .[name].. 18 U in Am and 14-16 U in Pm 21)  Amlodipine Besy-Benazepril Hcl 5-10 Mg Caps (Amlodipine Besy-Benazepril Hcl) .[name].. 1 By Mouth Qd 22)  Bd Pen Needle Short U/f 31g X 8 Mm Misc (Insulin Pen Needle) .... As Dirr 23)  Freestyle Lancets  Misc (Lancets) .... As Dirr 24)  Accu-Chek Soft Touch Lancets  Misc (Lancets) .... As Dirr 25)  Freestyle Test  Strp (Glucose Blood) .... Test Up To 5 Times Daily Dx: 250.01 26)  Promethazine Hcl 25 Mg Tabs (Promethazine Hcl) .[name].. 1-2 By Mouth Four Times A Day As Needed Nausea 27)  Protonix 40 Mg Tbec (Pantoprazole Sodium) .[name].. 1 By Mouth Qam For Indigestion 28)  Relpax 40 Mg Tabs (Eletriptan Hydrobromide) .[name].. 1 By Mouth Once Daily As Needed Migraine 29)  Topamax 200 Mg Tabs (Topiramate) .[name].. 1 By Mouth Two Times A Day For Migraine  Prevention 30)  Prochlorperazine Maleate 10 Mg Tabs (Prochlorperazine Maleate) .[name].. 1 By Mouth Two Times A Day  Allergies (verified): 1)  ! Sulfa 2)  ! Avelox (Moxifloxacin Hcl) 3)  ! Effexor 4)  Savella (Milnacipran Hcl) 5)  Duragesic-50 (Fentanyl) 6)  Maxalt  Past History:  Past Medical History: Last updated: 12/23/2009 Depression Diabetes mellitus, type I Hypertension Chronic pain FMS Migraines Breast cancer, hx of - right chronic pancreatitis Anxiety adrenal insufficiency Vit D deficiency GERD L 3d finger dislocated 2009 COPD Anemia-NOS Sternum fracture 11/2008  Osteoporosis (Reclast too expensive $900) Fibromyalgia Hx of pancreatitis Barretts Espohagus 2005 Chronic fatigue Osteoporosis  Social History: Last updated: 08/29/2009 Did not go back to work Oct 23, [name] is very ill - unable to have financial ends meet, no  income 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  The patient denies fever, dyspnea on exertion, and abdominal pain.    Physical Exam  General:  NAD Eyes:  vision grossly intact, pupils equal, and pupils round.   Mouth:  Oral mucosa and oropharynx without lesions or exudates.  Teeth in good repair. Neck:  No deformities, masses, or tenderness noted. Lungs:  clear bilaterally without rhonchi, wheezes or crackles  Heart:  RRR, no rubs, gallops or murmurs Abdomen:  S/NT Msk:  R 2nd MCP is tender Extremities:  no edema, no erythema  Feet are examined R toe is OK R inner heel with a 1 cm callus medially Neurologic:  alert & oriented X3.   Skin:  no rash B feet ok w/o ulcers  Psych:  Not  tearful, not anxious with less  depressed affect.  not suicidal and not homicidal.     Impression & Recommendations:  Problem # 1:  MENTAL CONFUSION (ICD-298.9) Assessment Improved Discussed. It is a very complex case.  Problem # 2:  FATIGUE (ICD-780.79) Assessment: Unchanged  Problem # 3:  DEPRESSION (ICD-311) Assessment: Deteriorated Case was discussed with Dr Luiz Blare The following medications were removed from the medication list:    Citalopram Hydrobromide 20 Mg Tabs (Citalopram hydrobromide) .[name]... 1-2 once daily Her updated medication list for this problem includes:    Amitriptyline Hcl 50 Mg Tabs (Amitriptyline hcl) .[name]... 1/2 tab by mouth q hs    Lorazepam 2 Mg Tabs (Lorazepam) .[name]... Three times a day prn    Viibryd 40 Mg Tabs (Vilazodone hcl) .[name]... 1 by mouth qam  Problem # 4:  DIABETES MELLITUS, TYPE I (ICD-250.01) Assessment: Unchanged  Her updated medication list for this problem includes:    Apidra Solostar 100 Unit/ml Soln (Insulin glulisine) .Marland KitchenMarland KitchenMarland KitchenMarland Kitchen 4 to 6 units daily as directed    Levemir  Flexpen 100 Unit/ml Soln (Insulin detemir) .Marland KitchenMarland KitchenMarland KitchenMarland Kitchen 18 u in am and 14-16 u in pm    Amlodipine Besy-benazepril Hcl 5-10 Mg Caps (Amlodipine besy-benazepril hcl) .[name]... 1 by mouth qd  Problem # 5:  COMMON MIGRAINE (ICD-346.10) Assessment: Unchanged  The following medications were removed from the medication list:    Oxycontin 40 Mg Tb12 (Oxycodone hcl) .[name]... 1 by mouth two times a day Her updated medication list for this problem includes:    Oxycontin 20 Mg Xr12h-tab (Oxycodone hcl) .[name]... 1 by mouth tid    Oxycodone Hcl 15 Mg Tabs (Oxycodone hcl) .[name]... 1 bid as needed pain.    Atenolol 100 Mg Tabs (Atenolol) .Marland KitchenMarland KitchenMarland KitchenMarland Kitchen  1  po qam    Zomig 5 Mg Tabs (Zolmitriptan) .[name]... 1 once daily as needed migraine    Relpax 40 Mg Tabs (Eletriptan hydrobromide) .[name]... 1 by mouth once daily as needed migraine  Problem # 6:  HAND PAIN (ICD-729.5)R  Assessment: Unchanged Pennsaid  Complete Medication List: 1)  Oxycontin 20 Mg Xr12h-tab (Oxycodone hcl) .[name].. 1 by mouth tid 2)  Oxycodone Hcl 15 Mg Tabs (Oxycodone hcl) .[name].. 1 bid as needed pain. 3)  Tamoxifen Citrate 20 Mg Tabs (Tamoxifen citrate) .[name].. 1 bid 4)  Atenolol 100 Mg Tabs (Atenolol) .[name].. 1  po qam 5)  Prilosec Otc 20 Mg Tbec (Omeprazole magnesium) .... 2 daily 6)  Amitriptyline Hcl 50 Mg Tabs (Amitriptyline hcl) .... 1/2 tab by mouth q hs 7)  Lorazepam 2 Mg Tabs (Lorazepam) .... Three times a day prn 8)  Cortef 10 Mg Tabs (Hydrocortisone) .[name].. 1 two times a day am and lunch 9)  Zomig 5 Mg Tabs (Zolmitriptan) .[name].. 1 once daily as needed migraine 10)  Nasonex 50 Mcg/act Susp (Mometasone furoate) .[name].. 1-2 each nostril qd 11)  Rocaltrol 0.5 Mcg Caps (Calcitriol) .[name].. 1 by mouth bid 12)  Topamax 100 Mg Tabs (Topiramate) .[name].. 1 by mouth bid 13)  Voltaren 1 % Gel (Diclofenac sodium) .... Two times a day  to qid as needed 14)  Ranitidine Hcl 300 Mg Tabs (Ranitidine hcl) .[name].. 1 po qd 15)  Triamcinolone Acetonide 0.1 % Oint (Triamcinolone acetonide) .... Use two  times a day prn45 16)  Accu-chek Aviva Strp (Glucose blood) .... Use two times a day or more 17)  Apidra Solostar 100 Unit/ml Soln (Insulin glulisine) .... 4 to 6 units daily as directed 18)  Levemir Flexpen 100 Unit/ml Soln (Insulin detemir) .[name].. 18 u in am and 14-16 u in pm 19)  Amlodipine Besy-benazepril Hcl 5-10 Mg Caps (Amlodipine besy-benazepril hcl) .[name].. 1 by mouth qd 20)  Bd Pen Needle Short U/f 31g X 8 Mm Misc (Insulin pen needle) .... As dirr 21)  Freestyle Lancets Misc (Lancets) .... As dirr 22)  Accu-chek Soft Touch Lancets Misc (Lancets) .... As dirr 23)  Freestyle Test Strp (Glucose blood) .... Test up to 5 times daily dx: 250.01 24)  Promethazine Hcl 25 Mg Tabs (Promethazine hcl) .[name].. 1-2 by mouth four times a day as needed nausea 25)  Protonix 40 Mg Tbec (Pantoprazole sodium) .[name].. 1 by mouth qam for indigestion 26)  Relpax 40 Mg Tabs (Eletriptan hydrobromide) .[name].. 1 by mouth once daily as needed migraine 27)  Topamax 200 Mg Tabs (Topiramate) .[name].. 1 by mouth two times a day for migraine prevention 28)  Prochlorperazine Maleate 10 Mg Tabs (Prochlorperazine maleate) .[name].. 1 by mouth two times a day 29)  Viibryd 40 Mg Tabs (Vilazodone hcl) .[name].. 1 by mouth qam  Patient Instructions: 1)  Pennsaid 1 drop 3 times a day 2)  Please schedule a follow-up appointment in 1 month. Prescriptions: OXYCONTIN 20 MG XR12H-TAB (OXYCODONE HCL) 1 by mouth tid  #90 x 0   Entered and Authorized by:   [name] MD   Signed by:   [name] MD on 01/24/2010   Method used:   Print then Give to Patient   RxID:   [id number] OXYCODONE HCL 15 MG  TABS (OXYCODONE HCL) 1 bid as needed pain.  #60 x 0   Entered and Authorized by:   [name] MD   Signed by:   [name] MD on  01/24/2010   Method used:   Print then Give to Patient   RxID:   [id number] VIIBRYD 40 MG TABS (VILAZODONE HCL) 1 by mouth qam  #30 x 6   Entered and Authorized by:   [name] MD   Signed by:   [name] MD on 01/24/2010   Method used:   Print then Give to Patient   RxID:   [id number]    Orders Added: 1)  Est. Patient Level V [84696]

## 2010-04-11 NOTE — Assessment & Plan Note (Signed)
Summary: 1 mo rov /nws   Vital Signs:  Patient profile:   65 year old female Height:      62.5 inches (158.75 cm) Weight:      117.25 pounds (53.30 kg) O2 Sat:      97 % on Room air Temp:     97.1 degrees F (36.17 degrees C) oral Pulse rate:   80 / minute BP sitting:   122 / 80  (left arm) Cuff size:   regular  Vitals Entered By: Lucious Groves (June 10, 2009 1:46 PM)/ Sherese Christopher SMA  O2 Flow:  Room air CC: Follow-up visit, pt denies allergies to Effexor, Amada Jupiter and Fentanyl//[place]   Primary Care Provider:  [name] MD  CC:  Follow-up visit, pt denies allergies to Effexor, and Savella and Fentanyl//[place].  History of Present Illness: The patient presents for a follow up of hypertension, diabetes, hyperlipidemia, HAs, depression She started to ride again  Current Medications (verified): 1)  Adderall 30 Mg Tabs (Amphetamine-Dextroamphetamine) .[name].. 1 By Mouth Bid 2)  Oxycontin 40 Mg  Tb12 (Oxycodone Hcl) .[name].. 1 By Mouth Three Times A Day 3)  Oxycodone Hcl 15 Mg  Tabs (Oxycodone Hcl) .[name].. 1 Qid As Needed Pain. 4)  Tamoxifen Citrate 20 Mg  Tabs (Tamoxifen Citrate) .[name].. 1 Bid 5)  Atenolol 100 Mg Tabs (Atenolol) .[name].. 1  Po Qam 6)  Prilosec Otc 20 Mg Tbec (Omeprazole Magnesium) .... 2 Daily 7)  Citalopram Hydrobromide 20 Mg Tabs (Citalopram Hydrobromide) .[name].. 1-2 Once Daily 8)  Lorazepam 2 Mg Tabs (Lorazepam) .... Three Times A Day Prn 9)  Cortef 10 Mg  Tabs (Hydrocortisone) .[name].. 1 Two Times A Day Am and Lunch 10)  Zomig 5 Mg  Tabs (Zolmitriptan) .[name].. 1 Once Daily As Needed Migraine 11)  Nasonex 50 Mcg/act Susp (Mometasone Furoate) .[name].. 1-2 Each Nostril Qd 12)  Rocaltrol 0.5 Mcg Caps (Calcitriol) .[name].. 1 By Mouth Qd 13)  Topamax 100 Mg  Tabs (Topiramate) .[name].. 1 By Mouth Bid 14)  Promethazine Hcl 25 Mg  Tabs (Promethazine Hcl) .[name].. 1 Q 6h As Needed Nausea 15)  Voltaren 1 %  Gel (Diclofenac Sodium) .... Two Times A Day  To Qid As Needed 16)  Ranitidine Hcl 300 Mg Tabs  (Ranitidine Hcl) .[name].. 1 Po Qd 17)  Triamcinolone Acetonide 0.1 % Oint (Triamcinolone Acetonide) .... Use Two Times A Day Prn45 18)  Accu-Chek Aviva  Strp (Glucose Blood) .... Use Two Times A Day or More 19)  Bd U/f Original Pen Needle 29g X 12.71mm Misc (Insulin Pen Needle) .... Use Two Times A Day 20)  Apidra Solostar 100 Unit/ml Soln (Insulin Glulisine) .... As Dirr 21)  Levemir Flexpen 100 Unit/ml  Soln (Insulin Detemir) .[name].. 18 U in Am and 14-16 U in Pm 22)  Meperidine Hcl 50 Mg Tabs (Meperidine Hcl) .[name].. 1 By Mouth Two Times A Day As Needed Severe Pain Breakthrough 23)  Amitriptyline Hcl 50 Mg Tabs (Amitriptyline Hcl) .[name].. 1 By Mouth Q Hs  Allergies (verified): 1)  ! Sulfa 2)  ! Avelox (Moxifloxacin Hcl) 3)  ! 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  The patient denies fever.    Physical Exam  General:  Less anxious and less teary NAD Head:  normocephalic and atraumatic.   Ears:  R ear normal and L ear normal.   Nose:  External nasal examination shows no deformity or inflammation. Nasal mucosa are pink and moist without lesions or exudates. Lungs:  clear bilaterally without rhonchi, wheezes or crackles  Heart:   RRR, no rubs, gallops or murmurs Abdomen:  S/NT Msk:  Lumbar-sacral spine is tender to palpation over paraspinal muscles and painfull with the ROM   Extremities:  no edema, no erythema  Neurologic:  alert & oriented X3.   Skin:  no rash L heel w/1x3 cm non tenderhealed ulcer Psych:  Not  tearful, not anxious with less  depressed affect.     Impression & Recommendations:  Problem # 1:  DIABETES MELLITUS, TYPE I (ICD-250.01) Assessment Improved  Her updated medication list for this problem includes:    Apidra Solostar 100 Unit/ml Soln (Insulin glulisine) .[name]... As dirr    Levemir Flexpen 100 Unit/ml Soln (Insulin detemir) .Marland KitchenMarland KitchenMarland KitchenMarland Kitchen 18 u in am and 14-16 u in pm  Problem # 2:  CHEST PAIN (ICD-786.50) Assessment: Improved  Problem # 3:  HEADACHE (ICD-784.0) Assessment: Unchanged  Her updated medication list for this problem includes:    Oxycontin 40 Mg Tb12 (Oxycodone hcl) .[name]... 1 by mouth three times a day    Oxycodone Hcl 15 Mg Tabs (Oxycodone hcl) .[name]... 1 qid as needed pain.    Atenolol 100 Mg Tabs (Atenolol) .[name]... 1  po qam    Zomig 5 Mg Tabs (Zolmitriptan) .[name]... 1 once daily as needed migraine    Meperidine Hcl 50 Mg Tabs (Meperidine hcl) .[name]... 1 by mouth two times a day as needed severe pain breakthrough  Problem # 4:  COMMON MIGRAINE (ICD-346.10) Assessment: Unchanged  Her updated medication list for this problem includes:    Oxycontin 40 Mg Tb12 (Oxycodone hcl) .[name]... 1 by mouth three times a day    Oxycodone Hcl 15 Mg Tabs (Oxycodone hcl) .[name]... 1 qid as needed pain.    Atenolol 100 Mg Tabs (Atenolol) .[name]... 1  po qam    Zomig 5 Mg Tabs (Zolmitriptan) .[name]... 1 once daily as needed migraine    Meperidine Hcl 50 Mg Tabs (Meperidine hcl) .[name]... 1 by mouth two times a day as needed severe pain breakthrough  Problem # 5:  ANXIETY (ICD-300.00) Assessment: Unchanged  Her updated medication list for this problem includes:    Citalopram Hydrobromide 20 Mg Tabs (Citalopram  hydrobromide) .[name]... 1-2 once daily    Lorazepam 2 Mg Tabs (Lorazepam) .[name]... Three times a day prn    Amitriptyline Hcl 50 Mg Tabs (Amitriptyline hcl) .[name]... 1 by mouth q hs  Problem # 6:  FIBROMYALGIA (ICD-729.1) Assessment: Unchanged  Her updated medication list for this problem includes:    Oxycontin 40 Mg Tb12 (Oxycodone hcl) .[name]... 1 by mouth three times a day    Oxycodone Hcl 15 Mg Tabs (Oxycodone hcl) .[name]... 1 qid as needed pain.    Meperidine Hcl 50 Mg Tabs (Meperidine hcl) .[name]... 1 by mouth  two times a day as needed severe pain breakthrough  Problem # 7:  HYPERTENSION (ICD-401.9) Assessment: Unchanged  Her updated medication list for this problem includes:    Atenolol 100 Mg Tabs (Atenolol) .[name]... 1  po qam  Complete Medication List: 1)  Adderall 30 Mg Tabs (Amphetamine-dextroamphetamine) .[name].. 1 by mouth bid 2)  Oxycontin 40 Mg Tb12 (Oxycodone hcl) .[name].. 1 by mouth three times a day 3)  Oxycodone Hcl 15 Mg Tabs (Oxycodone hcl) .[name].. 1 qid as needed pain. 4)  Tamoxifen Citrate 20 Mg Tabs (Tamoxifen citrate) .[name].. 1 bid 5)  Atenolol 100 Mg Tabs (Atenolol) .[name].. 1  po qam 6)  Prilosec Otc 20 Mg Tbec (Omeprazole magnesium) .... 2 daily 7)  Citalopram Hydrobromide 20 Mg Tabs (Citalopram hydrobromide) .[name].. 1-2 once daily 8)  Lorazepam 2 Mg Tabs (Lorazepam) .... Three times a day prn 9)  Cortef 10 Mg Tabs (Hydrocortisone) .[name].. 1 two times a day am and lunch 10)  Zomig 5 Mg Tabs (Zolmitriptan) .[name].. 1 once daily as needed migraine 11)  Nasonex 50 Mcg/act Susp (Mometasone furoate) .[name].. 1-2 each nostril qd 12)  Rocaltrol 0.5 Mcg Caps (Calcitriol) .[name].. 1 by mouth qd 13)  Topamax 100 Mg Tabs (Topiramate) .[name].. 1 by mouth bid 14)  Voltaren 1 % Gel (Diclofenac sodium) .... Two times a day  to qid as needed 15)  Ranitidine Hcl 300 Mg Tabs (Ranitidine hcl) .[name].. 1 po qd 16)  Triamcinolone Acetonide 0.1 % Oint (Triamcinolone acetonide) .... Use two times a day prn45 17)  Accu-chek Aviva Strp (Glucose  blood) .... Use two times a day or more 18)  Bd U/f Original Pen Needle 29g X 12.46mm Misc (Insulin pen needle) .... Use two times a day 19)  Apidra Solostar 100 Unit/ml Soln (Insulin glulisine) .... As dirr 20)  Levemir Flexpen 100 Unit/ml Soln (Insulin detemir) .[name].. 18 u in am and 14-16 u in pm 21)  Meperidine Hcl 50 Mg Tabs (Meperidine hcl) .[name].. 1 by mouth two times a day as needed severe pain breakthrough 22)  Amitriptyline Hcl 50 Mg Tabs (Amitriptyline hcl) .[name].. 1 by mouth q hs 23)  Prochlorperazine Maleate 10 Mg Tabs (Prochlorperazine maleate) .[name].. 1 by mouth two times a day as needed nausea  Patient Instructions: 1)  Please schedule a follow-up appointment in 1 month. Prescriptions: OXYCODONE HCL 15 MG  TABS (OXYCODONE HCL) 1 qid as needed pain.  #120 x 0   Entered and Authorized by:   [name] MD   Signed by:   [name] MD on 06/10/2009   Method used:   Print then Give to Patient   RxID:   [id number] OXYCONTIN 40 MG  TB12 (OXYCODONE HCL) 1 by mouth three times a day  #90 x 0   Entered and Authorized by:   [name] MD   Signed by:   [name] MD on 06/10/2009   Method used:   Print then Give to Patient   RxID:   [id number] PROCHLORPERAZINE MALEATE 10 MG TABS (PROCHLORPERAZINE MALEATE) 1 by mouth two times a day as needed nausea  #60 x 3   Entered and Authorized by:   [name] MD   Signed by:   [name] MD on 06/10/2009   Method used:   Print then Give to Patient   RxID:   [id number]

## 2010-04-11 NOTE — Letter (Signed)
Summary: Generic Letter  [facility]  [address] [place], [place] [address]   Phone: [phone]  Fax: [phone]    04/26/2009  RE: [facility] [address] [place], [place]  [address]   Ms. Afshar has been taken Adderall for her depression with apathy and somnolence for a long time. It has been helping her a lot. Please approve it: stopping it may have detrimental health consequences for her.           Sincerely,   Jacinta Shoe MD

## 2010-04-11 NOTE — Assessment & Plan Note (Signed)
Summary: f/u appt elev sugar/cd   Vital Signs:  Patient profile:   65 year old female Height:      62.5 inches Weight:      125 pounds BMI:     22.58 Temp:     98.8 degrees F oral Pulse rate:   92 / minute Pulse rhythm:   regular Resp:     16 per minute BP sitting:   130 / 92  (left arm) Cuff size:   regular  Vitals Entered By: Lanier Prude, CMA(AAMA) (November 29, 2009 4:05 PM) CC: elevated CBG, Migraine HA's Is Patient Diabetic? Yes Did you bring your meter with you today? Yes   Primary Care Provider:  [name] MD  CC:  elevated CBG and Migraine HA's.  History of Present Illness: The patient presents for a follow up of back pain, anxiety, depression and headaches, DM, migraines. She tells me she was awaken by a call from an RN from her former work had an episode of confusion  dreaming of having feces all over her. She took all her clothes off to clean up - She sounded confused so the [facility] RN called 911 to her house. Her CBG was 60...   Current Medications (verified): 1)  Adderall 30 Mg Tabs (Amphetamine-Dextroamphetamine) .[name].. 1 By Mouth Bid 2)  Oxycontin 40 Mg  Tb12 (Oxycodone Hcl) .[name].. 1 By Mouth Three Times A Day 3)  Oxycodone Hcl 15 Mg  Tabs (Oxycodone Hcl) .[name].. 1 Qid As Needed Pain. 4)  Meperidine Hcl 50 Mg Tabs (Meperidine Hcl) .[name].. 1 By Mouth Two Times A Day As Needed Severe Pain Breakthrough 5)  Tamoxifen Citrate 20 Mg  Tabs (Tamoxifen Citrate) .[name].. 1 Bid 6)  Atenolol 100 Mg Tabs (Atenolol) .[name].. 1  Po Qam 7)  Prilosec Otc 20 Mg Tbec (Omeprazole Magnesium) .... 2 Daily 8)  Amitriptyline Hcl 50 Mg Tabs (Amitriptyline Hcl) .[name].. 1 By Mouth Q Hs 9)  Citalopram Hydrobromide 20 Mg Tabs (Citalopram Hydrobromide) .[name].. 1-2 Once Daily 10)  Lorazepam 2 Mg Tabs (Lorazepam) .... Three Times A Day Prn 11)  Cortef 10 Mg  Tabs (Hydrocortisone) .[name].. 1 Two Times A Day Am and Lunch 12)  Zomig 5 Mg  Tabs (Zolmitriptan) .[name].. 1 Once Daily As Needed Migraine 13)   Nasonex 50 Mcg/act Susp (Mometasone Furoate) .[name].. 1-2 Each Nostril Qd 14)  Rocaltrol 0.5 Mcg Caps (Calcitriol) .[name].. 1 By Mouth Bid 15)  Topamax 100 Mg  Tabs (Topiramate) .[name].. 1 By Mouth Bid 16)  Voltaren 1 %  Gel (Diclofenac Sodium) .... Two Times A Day  To Qid As Needed 17)  Ranitidine Hcl 300 Mg Tabs (Ranitidine Hcl) .[name].. 1 Po Qd 18)  Triamcinolone Acetonide 0.1 % Oint (Triamcinolone Acetonide) .... Use Two Times A Day Prn45 19)  Accu-Chek Aviva  Strp (Glucose Blood) .... Use Two Times A Day or More 20)  Apidra Solostar 100 Unit/ml Soln (Insulin Glulisine) .... 4 To 6 Units Daily As Directed 21)  Levemir Flexpen 100 Unit/ml  Soln (Insulin Detemir) .[name].. 18 U in Am and 14-16 U in Pm 22)  Amlodipine Besy-Benazepril Hcl 5-10 Mg Caps (Amlodipine Besy-Benazepril Hcl) .[name].. 1 By Mouth Qd 23)  Bd Pen Needle Short U/f 31g X 8 Mm Misc (Insulin Pen Needle) .... As Dirr 24)  Freestyle Lancets  Misc (Lancets) .... As Dirr 25)  Accu-Chek Soft Touch Lancets  Misc (Lancets) .... As Dirr 26)  Freestyle Test  Strp (Glucose Blood) .... Test Up To 5  Times Daily Dx: 250.01 27)  Promethazine Hcl 25 Mg Tabs (Promethazine Hcl) .[name].. 1-2 By Mouth Four Times A Day As Needed Nausea 28)  Protonix 40 Mg Tbec (Pantoprazole Sodium) .[name].. 1 By Mouth Qam For Indigestion 29)  Relpax 40 Mg Tabs (Eletriptan Hydrobromide) .[name].. 1 By Mouth Once Daily As Needed Migraine  Allergies (verified): 1)  ! Sulfa 2)  ! Avelox (Moxifloxacin Hcl) 3)  ! Effexor 4)  Savella (Milnacipran Hcl) 5)  Duragesic-50 (Fentanyl) 6)  Maxalt  Past History:  Past Medical History: Last updated: 08/10/2009 Depression Diabetes mellitus, type I Hypertension Chronic pain FMS Migraines Breast cancer, hx of - right chronic pancreatitis Anxiety adrenal insufficiency Vit D deficiency GERD L 3d finger dislocated 2009 COPD Anemia-NOS Sternum fracture 11/2008  Osteoporosis (Reclast too expensive $900) Fibromyalgia Hx of pancreatitis Barretts  Espohagus 2005 Chronic fatigue  Past Surgical History: Last updated: 05/09/2007 Sinus surgery 40% Part Pancreatecomty Cholecystectomy  Social History: Last updated: 08/29/2009 Did not go back to work Oct 23, [name] is very ill - unable to have financial ends meet, no income 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  The patient denies fever, chest pain, muscle weakness, and difficulty walking.         HAs  Physical Exam  General:  anxious and less teary NAD Nose:  External nasal examination shows no deformity or inflammation. Nasal mucosa are pink and moist without lesions or exudates. Mouth:  Oral mucosa and oropharynx without lesions or exudates.  Teeth in good repair. Neck:  No deformities, masses, or tenderness noted. Lungs:  clear bilaterally without rhonchi, wheezes or crackles  Heart:  RRR, no rubs, gallops or murmurs Abdomen:  S/NT Msk:  R foot  toes are cramped Lumbar-sacral spine is tender to palpation over paraspinal muscles and painfull with the ROM   Neurologic:  alert & oriented X3.   Skin:  no rash B feet ok w/o ulcers  Cervical Nodes:  No lymphadenopathy noted Psych:  Not  tearful, not anxious with less  depressed affect.  not suicidal and not homicidal.     Impression & Recommendations:  Problem # 1:  MENTAL CONFUSION (ICD-298.9) due to meds most likely Assessment New We had a discussion re: meds side effects. We will be decreasing her Oxy and Adderall use. She is denying overuse/misuse. Risks vs benefits and controversies of a long term controlled substances use were discussed.   Problem # 2:  DIABETES MELLITUS, TYPE I (ICD-250.01) Assessment: Unchanged  Her updated medication list for this problem includes:    Apidra Solostar 100 Unit/ml Soln (Insulin glulisine) .Marland KitchenMarland KitchenMarland KitchenMarland Kitchen 4 to 6 units daily  as directed    Levemir Flexpen 100 Unit/ml Soln (Insulin detemir) .Marland KitchenMarland KitchenMarland KitchenMarland Kitchen 18 u in am and 14-16 u in pm    Amlodipine Besy-benazepril Hcl 5-10 Mg Caps (Amlodipine besy-benazepril hcl) .[name]... 1 by mouth qd  Orders: Podiatry Referral (Podiatry)  Problem # 3:  FOOT PAIN (ICD-729.5) R Assessment: New  Orders: Podiatry Referral (Podiatry)  Problem # 4:  FATIGUE (ICD-780.79) Assessment: Unchanged  Problem # 5:  COPD (ICD-496) Assessment: Unchanged  Problem # 6:  FIBROMYALGIA (ICD-729.1) Assessment: Unchanged  The following medications were removed from the medication list:    Meperidine Hcl 50 Mg Tabs (Meperidine hcl) .[name]... 1 by mouth two times a day as needed severe pain breakthrough Her  updated medication list for this problem includes:    Oxycontin 40 Mg Tb12 (Oxycodone hcl) .[name]... 1 by mouth three times a day    Oxycodone Hcl 15 Mg Tabs (Oxycodone hcl) .[name]... 1 tid as needed pain.  Complete Medication List: 1)  Adderall 20 Mg Tabs (Amphetamine-dextroamphetamine) .[name].. 1 by mouth two times a day am and lunch 2)  Oxycontin 40 Mg Tb12 (Oxycodone hcl) .[name].. 1 by mouth three times a day 3)  Oxycodone Hcl 15 Mg Tabs (Oxycodone hcl) .[name].. 1 tid as needed pain. 4)  Tamoxifen Citrate 20 Mg Tabs (Tamoxifen citrate) .[name].. 1 bid 5)  Atenolol 100 Mg Tabs (Atenolol) .[name].. 1  po qam 6)  Prilosec Otc 20 Mg Tbec (Omeprazole magnesium) .... 2 daily 7)  Amitriptyline Hcl 50 Mg Tabs (Amitriptyline hcl) .[name].. 1 by mouth q hs 8)  Citalopram Hydrobromide 20 Mg Tabs (Citalopram hydrobromide) .[name].. 1-2 once daily 9)  Lorazepam 2 Mg Tabs (Lorazepam) .... Three times a day prn 10)  Cortef 10 Mg Tabs (Hydrocortisone) .[name].. 1 two times a day am and lunch 11)  Zomig 5 Mg Tabs (Zolmitriptan) .[name].. 1 once daily as needed migraine 12)  Nasonex 50 Mcg/act Susp (Mometasone furoate) .[name].. 1-2 each nostril qd 13)  Rocaltrol 0.5 Mcg Caps (Calcitriol) .[name].. 1 by mouth bid 14)  Topamax 100 Mg Tabs (Topiramate) .[name].. 1 by mouth  bid 15)  Voltaren 1 % Gel (Diclofenac sodium) .... Two times a day  to qid as needed 16)  Ranitidine Hcl 300 Mg Tabs (Ranitidine hcl) .[name].. 1 po qd 17)  Triamcinolone Acetonide 0.1 % Oint (Triamcinolone acetonide) .... Use two times a day prn45 18)  Accu-chek Aviva Strp (Glucose blood) .... Use two times a day or more 19)  Apidra Solostar 100 Unit/ml Soln (Insulin glulisine) .... 4 to 6 units daily as directed 20)  Levemir Flexpen 100 Unit/ml Soln (Insulin detemir) .[name].. 18 u in am and 14-16 u in pm 21)  Amlodipine Besy-benazepril Hcl 5-10 Mg Caps (Amlodipine besy-benazepril hcl) .[name].. 1 by mouth qd 22)  Bd Pen Needle Short U/f 31g X 8 Mm Misc (Insulin pen needle) .... As dirr 23)  Freestyle Lancets Misc (Lancets) .... As dirr 24)  Accu-chek Soft Touch Lancets Misc (Lancets) .... As dirr 25)  Freestyle Test Strp (Glucose blood) .... Test up to 5 times daily dx: 250.01 26)  Promethazine Hcl 25 Mg Tabs (Promethazine hcl) .[name].. 1-2 by mouth four times a day as needed nausea 27)  Protonix 40 Mg Tbec (Pantoprazole sodium) .[name].. 1 by mouth qam for indigestion 28)  Relpax 40 Mg Tabs (Eletriptan hydrobromide) .[name].. 1 by mouth once daily as needed migraine 29)  Topamax 200 Mg Tabs (Topiramate) .[name].. 1 by mouth two times a day for migraine prevention  Other Orders: Admin 1st Vaccine (40981) Flu Vaccine 74yrs + (19147)  Patient Instructions: 1)  Please schedule a follow-up appointment in 1 month. Prescriptions: ADDERALL 20 MG TABS (AMPHETAMINE-DEXTROAMPHETAMINE) 1 by mouth two times a day am and lunch  #60 x 0   Entered and Authorized by:   [name] MD   Signed by:   [name] MD on 11/29/2009   Method used:   Print then Give to Patient   RxID:   [id number] OXYCONTIN 40 MG  TB12 (OXYCODONE HCL) 1 by mouth three times a day  #90 x 0   Entered and Authorized by:   [name] MD   Signed by:   Georgina Quint  Plotnikov MD on 11/29/2009   Method used:   Print then Give to  Patient   RxID:   [id number] OXYCODONE HCL 15 MG  TABS (OXYCODONE HCL) 1 tid as needed pain.  #90 x 0   Entered and Authorized by:   [name] MD   Signed by:   [name] MD on 11/29/2009   Method used:   Print then Give to Patient   RxID:   [id number] TOPAMAX 200 MG TABS (TOPIRAMATE) 1 by mouth two times a day for migraine prevention  #60 x 12   Entered and Authorized by:   [name] MD   Signed by:   [name] MD on 11/29/2009   Method used:   Print then Give to Patient   RxID:   [id number]   .lbflu   Flu Vaccine Consent Questions     Do you have a history of severe allergic reactions to this vaccine? no    Any prior history of allergic reactions to egg and/or gelatin? no    Do you have a sensitivity to the preservative Thimersol? no    Do you have a past history of Guillan-Barre Syndrome? no    Do you currently have an acute febrile illness? no    Have you ever had a severe reaction to latex? no    Vaccine information given and explained to patient? yes    Are you currently pregnant? no    Lot Number:[id number]   Exp Date:09/09/2010   Site Given  Left Deltoid IM Lanier Prude, [facility])  November 29, 2009 4:42 PM

## 2010-04-11 NOTE — Medication Information (Signed)
Summary: Approved Amphetamine Salt Combo / [facility]  Approved Amphetamine Salt Combo / [employer]   Imported By: Lennie Odor 05/11/2009 15:23:26  _____________________________________________________________________  External Attachment:    Type:   Image     Comment:   External Document

## 2010-04-11 NOTE — Progress Notes (Signed)
Summary: ADDERALL RX's SHREDDED  Phone Note From Other Clinic   Summary of Call: RX's for Adderall 20mg  dated sept and october brought back in office and shredded.  Initial call taken by: Lamar Sprinkles, CMA,  January 26, 2010 11:13 AM

## 2010-04-11 NOTE — Progress Notes (Signed)
Summary: REFILL - Test Strips  Phone Note Refill Request Message from:  Pharmacy  Refills Requested: Medication #1:  FREESTYLE TEST  STRP test up to 5 times daily dx: 250.01. Initial call taken by: Lamar Sprinkles, CMA,  September 05, 2009 10:03 AM    New/Updated Medications: FREESTYLE TEST  STRP (GLUCOSE BLOOD) test up to 5 times daily dx: 250.01 Prescriptions: FREESTYLE TEST  STRP (GLUCOSE BLOOD) test up to 5 times daily dx: 250.01  #3 mth x 3   Entered by:   Lamar Sprinkles, CMA   Authorized by:   [name] MD   Signed by:   Lamar Sprinkles, CMA on 09/05/2009   Method used:   Electronically to        [facility]* (retail)       [address]       [place], [place]  [id number]       Ph: [phone]       Fax: [phone]   RxID:   [id number]

## 2010-04-11 NOTE — Medication Information (Signed)
Summary: Prior Auth/[facility]  Prior Auth/[facility]   Imported By: Lester [place] 09/29/2009 08:16:47  _____________________________________________________________________  External Attachment:    Type:   Image     Comment:   External Document

## 2010-04-11 NOTE — Procedures (Signed)
Summary: Colonoscopy/[facility]  Colonoscopy/[facility]   Imported By: Sherian Rein 08/03/2009 11:11:54  _____________________________________________________________________  External Attachment:    Type:   Image     Comment:   External Document

## 2010-04-11 NOTE — Assessment & Plan Note (Signed)
Summary: 1 MO ROV/NWS   Vital Signs:  Patient profile:   65 year old female Height:      62.5 inches (158.75 cm) Weight:      122.50 pounds (55.68 kg) BMI:     22.13 O2 Sat:      97 % on Room air Temp:     97.3 degrees F (36.28 degrees C) oral Pulse rate:   77 / minute BP sitting:   110 / 68  (left arm) Cuff size:   regular  Vitals Entered By: Lucious Groves (August 10, 2009 1:55 PM)  O2 Flow:  Room air CC: 1 mo rtn ov./kb Is Patient Diabetic? Yes Pain Assessment Patient in pain? no      Comments Refill Citalopram and Amlodipine-Benazepril./kb   Primary Care Provider:  [name] MD  CC:  1 mo rtn ov./kb.  History of Present Illness: The patient presents for a follow up of hypertension, diabetes, hyperlipidemia, FMS, migraines, chronic fatigue C/o bad HAs - she was nauseated and threw up pain meds on several occasions  Current Medications (verified): 1)  Adderall 30 Mg Tabs (Amphetamine-Dextroamphetamine) .[name].. 1 By Mouth Bid 2)  Oxycontin 40 Mg  Tb12 (Oxycodone Hcl) .[name].. 1 By Mouth Three Times A Day 3)  Oxycodone Hcl 15 Mg  Tabs (Oxycodone Hcl) .[name].. 1 Qid As Needed Pain. 4)  Tamoxifen Citrate 20 Mg  Tabs (Tamoxifen Citrate) .[name].. 1 Bid 5)  Atenolol 100 Mg Tabs (Atenolol) .[name].. 1  Po Qam 6)  Prilosec Otc 20 Mg Tbec (Omeprazole Magnesium) .... 2 Daily 7)  Citalopram Hydrobromide 20 Mg Tabs (Citalopram Hydrobromide) .[name].. 1-2 Once Daily 8)  Lorazepam 2 Mg Tabs (Lorazepam) .... Three Times A Day Prn 9)  Cortef 10 Mg  Tabs (Hydrocortisone) .[name].. 1 Two Times A Day Am and Lunch 10)  Zomig 5 Mg  Tabs (Zolmitriptan) .[name].. 1 Once Daily As Needed Migraine 11)  Nasonex 50 Mcg/act Susp (Mometasone Furoate) .[name].. 1-2 Each Nostril Qd 12)  Rocaltrol 0.5 Mcg Caps (Calcitriol) .[name].. 1 By Mouth Qd 13)  Topamax 100 Mg  Tabs (Topiramate) .[name].. 1 By Mouth Bid 14)  Voltaren 1 %  Gel (Diclofenac Sodium) .... Two Times A Day  To Qid As Needed 15)  Ranitidine Hcl 300 Mg Tabs (Ranitidine Hcl)  .[name].. 1 Po Qd 16)  Triamcinolone Acetonide 0.1 % Oint (Triamcinolone Acetonide) .... Use Two Times A Day Prn45 17)  Accu-Chek Aviva  Strp (Glucose Blood) .... Use Two Times A Day or More 18)  Apidra Solostar 100 Unit/ml Soln (Insulin Glulisine) .... 4 To 6 Units Daily As Directed 19)  Levemir Flexpen 100 Unit/ml  Soln (Insulin Detemir) .[name].. 18 U in Am and 14-16 U in Pm 20)  Meperidine Hcl 50 Mg Tabs (Meperidine Hcl) .[name].. 1 By Mouth Two Times A Day As Needed Severe Pain Breakthrough 21)  Amitriptyline Hcl 50 Mg Tabs (Amitriptyline Hcl) .[name].. 1 By Mouth Q Hs 22)  Prochlorperazine Maleate 10 Mg Tabs (Prochlorperazine Maleate) .[name].. 1 By Mouth Two Times A Day As Needed Nausea 23)  Omeprazole 40 Mg Cpdr (Omeprazole) .[name].. 1 By Mouth Qam For Indigestion 24)  Relion Pen Needles 31g X 8 Mm Misc (Insulin Pen Needle) .... Use Bid 25)  Amlodipine Besy-Benazepril Hcl 5-10 Mg Caps (Amlodipine Besy-Benazepril Hcl) .[name].. 1 By Mouth Qd  Allergies (verified): 1)  ! Sulfa 2)  ! Avelox (Moxifloxacin Hcl) 3)  ! 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  The patient denies fever, chest pain, dyspnea on exertion, abdominal pain, and difficulty walking.         HAs  Physical Exam  General:  Less anxious and less teary NAD Head:  R scull base posteriorly is a little tender Ears:  R ear normal and L ear normal.   Nose:  External nasal examination shows no deformity or inflammation. Nasal mucosa are pink and moist without lesions or exudates. Mouth:  Oral mucosa and oropharynx without lesions or exudates.  Teeth in good repair. Neck:  No deformities, masses, or tenderness noted. Lungs:  clear bilaterally without rhonchi, wheezes or crackles  Heart:  RRR, no rubs, gallops or murmurs Abdomen:  S/NT Msk:  Lumbar-sacral spine is tender to palpation over paraspinal muscles and painfull with the ROM   Extremities:  no edema, no erythema  Neurologic:  alert & oriented X3.   Skin:  no rash L heel w/1x3 cm non tenderhealed ulcer Psych:  Not  tearful, not anxious with less  depressed affect.     Impression & Recommendations:  Problem # 1:  DIABETES MELLITUS, TYPE I (ICD-250.01) Assessment Improved  Her updated medication list for this problem includes:    Apidra Solostar 100 Unit/ml Soln (Insulin glulisine) .Marland KitchenMarland KitchenMarland KitchenMarland Kitchen 4 to 6 units daily as directed    Levemir Flexpen 100 Unit/ml Soln (Insulin detemir) .Marland KitchenMarland KitchenMarland KitchenMarland Kitchen 18 u in am and 14-16 u in pm    Amlodipine Besy-benazepril Hcl 5-10 Mg Caps (Amlodipine besy-benazepril hcl) .[name]... 1 by mouth qd  Problem # 2:  COMMON MIGRAINE (ICD-346.10) Assessment: Deteriorated  Her updated medication list for this problem includes:    Oxycontin 40 Mg Tb12 (Oxycodone hcl) .[name]... 1 by mouth three times a day    Oxycodone Hcl 15 Mg Tabs (Oxycodone hcl) .[name]... 1 qid as needed pain.    Atenolol 100 Mg Tabs (Atenolol) .[name]... 1  po qam    Zomig 5 Mg Tabs (Zolmitriptan) .[name]... 1 once daily as needed migraine    Meperidine Hcl 50 Mg Tabs (Meperidine hcl) .[name]... 1 by mouth two times a day as needed severe pain  breakthrough We may inject a trigger point on R posterior neck  Problem # 3:  FATIGUE (ICD-780.79) Assessment: Unchanged  Problem # 4:  ANXIETY (ICD-300.00) Assessment: Unchanged  Her updated medication list for this problem includes:    Citalopram Hydrobromide 20 Mg Tabs (Citalopram hydrobromide) .[name]... 1-2 once daily    Lorazepam 2 Mg Tabs (Lorazepam) .[name]... Three times a day prn    Amitriptyline Hcl 50 Mg Tabs (Amitriptyline hcl) .[name]... 1 by mouth q hs  Problem # 5:  DEPRESSION (ICD-311) Assessment: Unchanged  Her updated medication list for this problem includes:    Citalopram Hydrobromide 20 Mg Tabs (Citalopram hydrobromide) .Marland KitchenMarland KitchenMarland KitchenMarland Kitchen  1-2 once daily    Lorazepam 2 Mg Tabs (Lorazepam) .[name]... Three times a day prn    Amitriptyline Hcl 50 Mg Tabs (Amitriptyline hcl) .[name]... 1 by mouth q hs  Problem # 6:  TOBACCO USE DISORDER/SMOKER-SMOKING CESSATION DISCUSSED (ICD-305.1) Assessment: Unchanged  Encouraged smoking cessation and discussed different methods for smoking cessation.   Complete Medication List: 1)  Adderall 30 Mg Tabs (Amphetamine-dextroamphetamine) .[name].. 1 by mouth bid 2)  Oxycontin 40 Mg Tb12 (Oxycodone hcl) .[name].. 1 by mouth three times a day 3)  Oxycodone Hcl 15 Mg Tabs (Oxycodone hcl) .[name].. 1 qid as needed pain. 4)  Tamoxifen Citrate 20 Mg Tabs (Tamoxifen citrate) .[name].. 1 bid 5)  Atenolol 100 Mg Tabs (Atenolol) .[name].. 1  po qam 6)  Prilosec Otc 20 Mg Tbec (Omeprazole magnesium) .... 2 daily 7)  Citalopram Hydrobromide 20 Mg Tabs (Citalopram hydrobromide) .[name].. 1-2 once daily 8)  Lorazepam 2 Mg Tabs (Lorazepam) .... Three times a day prn 9)  Cortef 10 Mg Tabs (Hydrocortisone) .[name].. 1 two times a day am and lunch 10)  Zomig 5 Mg Tabs (Zolmitriptan) .[name].. 1 once daily as needed migraine 11)  Nasonex 50 Mcg/act Susp (Mometasone furoate) .[name].. 1-2 each nostril qd 12)  Rocaltrol 0.5 Mcg Caps (Calcitriol) .[name].. 1 by mouth qd 13)  Topamax 100 Mg Tabs (Topiramate) .[name].. 1 by mouth bid 14)   Voltaren 1 % Gel (Diclofenac sodium) .... Two times a day  to qid as needed 15)  Ranitidine Hcl 300 Mg Tabs (Ranitidine hcl) .[name].. 1 po qd 16)  Triamcinolone Acetonide 0.1 % Oint (Triamcinolone acetonide) .... Use two times a day prn45 17)  Accu-chek Aviva Strp (Glucose blood) .... Use two times a day or more 18)  Apidra Solostar 100 Unit/ml Soln (Insulin glulisine) .... 4 to 6 units daily as directed 19)  Levemir Flexpen 100 Unit/ml Soln (Insulin detemir) .[name].. 18 u in am and 14-16 u in pm 20)  Meperidine Hcl 50 Mg Tabs (Meperidine hcl) .[name].. 1 by mouth two times a day as needed severe pain breakthrough 21)  Amitriptyline Hcl 50 Mg Tabs (Amitriptyline hcl) .[name].. 1 by mouth q hs 22)  Prochlorperazine Maleate 10 Mg Tabs (Prochlorperazine maleate) .[name].. 1 by mouth two times a day as needed nausea 23)  Omeprazole 40 Mg Cpdr (Omeprazole) .[name].. 1 by mouth qam for indigestion 24)  Relion Pen Needles 31g X 8 Mm Misc (Insulin pen needle) .... Use bid 25)  Amlodipine Besy-benazepril Hcl 5-10 Mg Caps (Amlodipine besy-benazepril hcl) .[name].. 1 by mouth qd  Patient Instructions: 1)  Please schedule a follow-up appointment in 1 month. Prescriptions: MEPERIDINE HCL 50 MG TABS (MEPERIDINE HCL) 1 by mouth two times a day as needed severe pain breakthrough  #20 x 0   Entered and Authorized by:   [name] MD   Signed by:   [name] MD on 08/10/2009   Method used:   Print then Give to Patient   RxID:   [id number] OXYCODONE HCL 15 MG  TABS (OXYCODONE HCL) 1 qid as needed pain.  #120 x 0   Entered and Authorized by:   [name] MD   Signed by:   [name] MD on 08/10/2009   Method used:   Print then Give to Patient   RxID:   [id number] OXYCONTIN 40 MG  TB12 (OXYCODONE HCL) 1 by mouth three times a day  #90 x 0   Entered and Authorized by:   Georgina Quint  Mayra Jolliffe MD   Signed by:   [name] MD on 08/10/2009   Method used:   Print then Give to Patient    RxID:   [id number] ADDERALL 30 MG TABS (AMPHETAMINE-DEXTROAMPHETAMINE) 1 by mouth bid  #60 x 0   Entered and Authorized by:   [name] MD   Signed by:   [name] MD on 08/10/2009   Method used:   Print then Give to Patient   RxID:   [id number] CITALOPRAM HYDROBROMIDE 20 MG TABS (CITALOPRAM HYDROBROMIDE) 1-2 once daily  #60 x 6   Entered and Authorized by:   [name] MD   Signed by:   [name] MD on 08/10/2009   Method used:   Electronically to        [facility]* (retail)       [address]       [place], [place]  [id number]       Ph: [phone]       Fax: [phone]   RxID:   [id number] AMLODIPINE BESY-BENAZEPRIL HCL 5-10 MG CAPS (AMLODIPINE BESY-BENAZEPRIL HCL) 1 by mouth qd  #30 x 12   Entered and Authorized by:   [name] MD   Signed by:   [name] MD on 08/10/2009   Method used:   Electronically to        [facility]* (retail)       [address]       [place], [place]  [id number]       Ph: [phone]       Fax: [phone]   RxID:   [id number]

## 2010-04-11 NOTE — Assessment & Plan Note (Signed)
Summary: 1 MO ROV /NWS   Vital Signs:  Patient profile:   65 year old female Weight:      111 pounds Temp:     98.3 degrees F oral Pulse rate:   80 / minute BP sitting:   96 / 64  (left arm)  Vitals Entered By: Tora Perches (April 12, 2009 1:41 PM) CC: f/u Is Patient Diabetic? Yes   Primary Care Provider:  [name] MD  CC:  f/u.  History of Present Illness: The patient presents for a follow up of rib fx, pneumothorax, hypertension, diabetes.  C/o HAs  Current Medications (verified): 1)  Adderall 30 Mg Tabs (Amphetamine-Dextroamphetamine) .[name].. 1 By Mouth Bid 2)  Oxycontin 40 Mg  Tb12 (Oxycodone Hcl) .[name].. 1 By Mouth Three Times A Day 3)  Oxycodone Hcl 15 Mg  Tabs (Oxycodone Hcl) .[name].. 1 Qid As Needed Pain. 4)  Tamoxifen Citrate 20 Mg  Tabs (Tamoxifen Citrate) .[name].. 1 Bid 5)  Atenolol 100 Mg Tabs (Atenolol) .[name].. 1  Po Qam 6)  Prilosec Otc 20 Mg Tbec (Omeprazole Magnesium) .... 2 Daily 7)  Citalopram Hydrobromide 20 Mg Tabs (Citalopram Hydrobromide) .[name].. 1-2 Once Daily 8)  Lorazepam 2 Mg Tabs (Lorazepam) .... Three Times A Day Prn 9)  Cortef 10 Mg  Tabs (Hydrocortisone) .[name].. 1 Two Times A Day Am and Lunch 10)  Zomig 5 Mg  Tabs (Zolmitriptan) .[name].. 1 Once Daily As Needed Migraine 11)  Nasonex 50 Mcg/act Susp (Mometasone Furoate) .[name].. 1-2 Each Nostril Qd 12)  Rocaltrol 0.5 Mcg Caps (Calcitriol) .[name].. 1 By Mouth Qd 13)  Topamax 100 Mg  Tabs (Topiramate) .[name].. 1 By Mouth Bid 14)  Promethazine Hcl 25 Mg  Tabs (Promethazine Hcl) .[name].. 1 Q 6h As Needed Nausea 15)  Voltaren 1 %  Gel (Diclofenac Sodium) .... Two Times A Day  To Qid As Needed 16)  Ranitidine Hcl 300 Mg Tabs (Ranitidine Hcl) .[name].. 1 Po Qd 17)  Triamcinolone Acetonide 0.1 % Oint (Triamcinolone Acetonide) .... Use Two Times A Day Prn45 18)  Accu-Chek Aviva  Strp (Glucose Blood) .... Use Two Times A Day or More 19)  Bd U/f Original Pen Needle 29g X 12.23mm Misc (Insulin Pen Needle) .... Use Two Times A Day 20)   Apidra Solostar 100 Unit/ml Soln (Insulin Glulisine) .... As Dirr 21)  Levemir Flexpen 100 Unit/ml  Soln (Insulin Detemir) .[name].. 18 U in Am and 14-16 U in Pm 22)  Meperidine Hcl 50 Mg Tabs (Meperidine Hcl) .[name].. 1 By Mouth Two Times A Day As Needed Severe Pain Breakthrough 23)  Amitriptyline Hcl 50 Mg Tabs (Amitriptyline Hcl) .[name].. 1 By Mouth Q Hs  Allergies: 1)  ! Sulfa 2)  ! Avelox (Moxifloxacin Hcl) 3)  ! 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 Nasal mucosa are pink and moist without lesions or exudates. Mouth:  Oral mucosa and oropharynx without lesions or exudates.  Teeth in good repair. Neck:  No deformities, masses, or tenderness noted. Chest Wall:  Painful on R, less Lungs:  clear bilaterally without rhonchi, wheezes or crackles  Heart:  RRR, no rubs, gallops or murmurs Abdomen:  S/NT Msk:  Lumbar-sacral spine is tender to palpation over paraspinal muscles and painfull with the ROM   Neurologic:  alert & oriented X3.   Skin:  no rash L heel w/1x3 cm non tenderhealed ulcer Psych:   tearful, moderately anxious   depressed affect.     Impression & Recommendations:  Problem # 1:  PNEUMOTHORAX (ICD-512.8) Assessment Improved  Problem # 2:  CLOSED FRACTURE OF MULTIPLE RIBS UNSPECIFIED (ICD-807.09) Assessment: Improved  Problem # 3:  OSTEOPOROSIS (ICD-733.00) Assessment: Unchanged Will check re Prolia  Problem # 4:  DIABETES MELLITUS, TYPE I (ICD-250.01) Assessment: Unchanged  Her updated medication list for this problem includes:    Apidra Solostar 100 Unit/ml Soln (Insulin glulisine) .[name]... As dirr    Levemir Flexpen 100 Unit/ml Soln (Insulin detemir) .Marland KitchenMarland KitchenMarland KitchenMarland Kitchen 18 u in am and 14-16 u in pm  Problem # 5:  ANXIETY (ICD-300.00) Assessment: Comment Only  Her updated medication list for this problem includes:    Citalopram Hydrobromide 20 Mg Tabs (Citalopram hydrobromide) .[name]... 1-2 once daily    Lorazepam 2 Mg Tabs (Lorazepam) .[name]... Three times a day prn    Amitriptyline Hcl 50 Mg Tabs (Amitriptyline hcl) .[name]... 1 by mouth q hs  Problem # 6:  HEADACHE (ICD-784.0) Assessment: Deteriorated Lido/Depo to triggers or/and Botox option discussed Her updated medication list for this problem includes:    Oxycontin 40 Mg Tb12 (Oxycodone hcl) .[name]... 1 by mouth three times a day    Oxycodone Hcl 15 Mg Tabs (Oxycodone hcl) .[name]... 1 qid as needed pain.    Atenolol 100 Mg Tabs (Atenolol) .[name]... 1  po qam    Zomig 5 Mg Tabs (Zolmitriptan) .[name]... 1 once daily as needed migraine    Meperidine Hcl 50 Mg Tabs (Meperidine hcl) .[name]... 1 by mouth two times a day as needed severe pain breakthrough  Complete Medication List: 1)  Adderall 30 Mg Tabs (Amphetamine-dextroamphetamine) .[name].. 1 by mouth bid 2)  Oxycontin 40 Mg Tb12 (Oxycodone hcl) .[name].. 1 by mouth three times a day 3)  Oxycodone Hcl 15 Mg Tabs (Oxycodone hcl) .[name].. 1 qid as needed pain. 4)  Tamoxifen Citrate 20 Mg Tabs (Tamoxifen citrate) .[name].. 1 bid 5)  Atenolol 100 Mg Tabs (Atenolol) .[name].. 1  po qam 6)  Prilosec Otc 20 Mg Tbec (Omeprazole magnesium) .... 2  daily 7)  Citalopram Hydrobromide 20 Mg Tabs (Citalopram hydrobromide) .[name].. 1-2 once daily 8)  Lorazepam 2 Mg Tabs (Lorazepam) .... Three times a day prn 9)  Cortef 10 Mg Tabs (Hydrocortisone) .[name].. 1 two times a day am and lunch 10)  Zomig 5 Mg Tabs (Zolmitriptan) .[name].. 1 once daily as needed migraine 11)  Nasonex 50 Mcg/act Susp (Mometasone furoate) .[name].. 1-2 each nostril qd 12)  Rocaltrol 0.5 Mcg Caps (Calcitriol) .[name].. 1 by mouth qd 13)  Topamax 100 Mg Tabs (Topiramate) .[name].. 1 by mouth bid 14)  Promethazine Hcl 25 Mg Tabs (Promethazine hcl) .[name].. 1 q 6h as needed nausea 15)  Voltaren 1 % Gel (Diclofenac sodium) .... Two times a day  to qid as needed 16)  Ranitidine Hcl 300 Mg Tabs (Ranitidine hcl) .[name].. 1 po qd 17)  Triamcinolone Acetonide 0.1 % Oint (Triamcinolone acetonide) .... Use two times a day prn45 18)  Accu-chek Aviva Strp (Glucose blood) .... Use two times a day or more 19)  Bd U/f Original Pen Needle 29g X 12.54mm Misc (Insulin pen needle) .... Use two times a day 20)  Apidra Solostar 100 Unit/ml Soln (Insulin glulisine) .... As dirr 21)  Levemir Flexpen 100 Unit/ml Soln (Insulin detemir) .[name].. 18 u in am and 14-16 u in pm 22)  Meperidine Hcl 50 Mg Tabs (Meperidine hcl) .[name].. 1 by mouth two times a day as needed severe pain breakthrough 23)  Amitriptyline Hcl 50 Mg Tabs (Amitriptyline hcl) .[name].. 1 by mouth q hs  Patient Instructions: 1)  Please schedule a follow-up appointment in 1 month. 2)  BMP prior to visit, ICD-9: 3)  Hepatic Panel prior to visit, ICD-9: 4)  HbgA1C prior to visit, ICD-9250.00: Prescriptions: PROMETHAZINE HCL 25 MG  TABS (PROMETHAZINE HCL) 1 q 6h as needed nausea  #60 x 3   Entered and Authorized by:   [name] MD   Signed by:   [name] MD on 04/12/2009   Method used:   Print then Give to Patient   RxID:   [id number] ZOMIG 5 MG  TABS (ZOLMITRIPTAN) 1 once daily as needed migraine  #12 Each x 0   Entered and Authorized by:   [name] MD   Signed by:   [name] MD on 04/12/2009   Method used:   Print then Give to Patient   RxID:   [id number] OXYCODONE HCL 15 MG  TABS (OXYCODONE HCL) 1 qid as needed pain.  #120 x 0   Entered and Authorized by:   [name] MD   Signed by:   [name] MD on 04/12/2009   Method used:   Print then Give to Patient   RxID:   [id number] OXYCONTIN 40 MG  TB12 (OXYCODONE HCL) 1 by mouth three times a day  #90 x 0   Entered and Authorized by:   [name] MD   Signed by:   [name] MD on 04/12/2009   Method used:   Print then Give to Patient   RxID:   [id number] ADDERALL 30 MG TABS (AMPHETAMINE-DEXTROAMPHETAMINE) 1 by mouth bid  #60 x 0   Entered and Authorized by:   [name] MD   Signed by:   [name] MD on 04/12/2009   Method used:   Print then Give to Patient   RxID:   [id number]

## 2010-04-11 NOTE — Medication Information (Signed)
Summary: Prior autho & approved for Omeprazole/[facility]  Prior autho & approved for Omeprazole/[facility]   Imported By: Sherian Rein 11/02/2009 15:16:11  _____________________________________________________________________  External Attachment:    Type:   Image     Comment:   External Document

## 2010-04-11 NOTE — Assessment & Plan Note (Signed)
Summary: per pt d/t---stc   Vital Signs:  Patient profile:   65 year old female Height:      62.5 inches (158.75 cm) Weight:      124 pounds (56.36 kg) BMI:     22.40 O2 Sat:      98 % on Room air Temp:     97.7 degrees F (36.50 degrees C) oral Pulse rate:   76 / minute BP sitting:   100 / 70  (left arm) Cuff size:   regular  Vitals Entered By: Lucious Groves (August 29, 2009 3:45 PM)  O2 Flow:  Room air CC: follow-up visit/kb Is Patient Diabetic? Yes Pain Assessment Patient in pain? no        Primary Care Provider:  Georgina Quint Grainger Mccarley MD  CC:  follow-up visit/kb.  History of Present Illness: C/o bad HA x 1 months. C/o cat bite w/a feline leukemia. The patient presents for a follow up of hypertension, diabetes, hyperlipidemia, FMS C/o splinter in the finger  Current Medications (verified): 1)  Adderall 30 Mg Tabs (Amphetamine-Dextroamphetamine) .[name].. 1 By Mouth Bid 2)  Oxycontin 40 Mg  Tb12 (Oxycodone Hcl) .[name].. 1 By Mouth Three Times A Day 3)  Oxycodone Hcl 15 Mg  Tabs (Oxycodone Hcl) .[name].. 1 Qid As Needed Pain. 4)  Tamoxifen Citrate 20 Mg  Tabs (Tamoxifen Citrate) .[name].. 1 Bid 5)  Atenolol 100 Mg Tabs (Atenolol) .[name].. 1  Po Qam 6)  Prilosec Otc 20 Mg Tbec (Omeprazole Magnesium) .... 2 Daily 7)  Citalopram Hydrobromide 20 Mg Tabs (Citalopram Hydrobromide) .[name].. 1-2 Once Daily 8)  Lorazepam 2 Mg Tabs (Lorazepam) .... Three Times A Day Prn 9)  Cortef 10 Mg  Tabs (Hydrocortisone) .[name].. 1 Two Times A Day Am and Lunch 10)  Zomig 5 Mg  Tabs (Zolmitriptan) .[name].. 1 Once Daily As Needed Migraine 11)  Nasonex 50 Mcg/act Susp (Mometasone Furoate) .[name].. 1-2 Each Nostril Qd 12)  Rocaltrol 0.5 Mcg Caps (Calcitriol) .[name].. 1 By Mouth Qd 13)  Topamax 100 Mg  Tabs (Topiramate) .[name].. 1 By Mouth Bid 14)  Voltaren 1 %  Gel (Diclofenac Sodium) .... Two Times A Day  To Qid As Needed 15)  Ranitidine Hcl 300 Mg Tabs (Ranitidine Hcl) .[name].. 1 Po Qd 16)  Triamcinolone Acetonide 0.1 % Oint (Triamcinolone  Acetonide) .... Use Two Times A Day Prn45 17)  Accu-Chek Aviva  Strp (Glucose Blood) .... Use Two Times A Day or More 18)  Apidra Solostar 100 Unit/ml Soln (Insulin Glulisine) .... 4 To 6 Units Daily As Directed 19)  Levemir Flexpen 100 Unit/ml  Soln (Insulin Detemir) .[name].. 18 U in Am and 14-16 U in Pm 20)  Meperidine Hcl 50 Mg Tabs (Meperidine Hcl) .[name].. 1 By Mouth Two Times A Day As Needed Severe Pain Breakthrough 21)  Amitriptyline Hcl 50 Mg Tabs (Amitriptyline Hcl) .[name].. 1 By Mouth Q Hs 22)  Prochlorperazine Maleate 10 Mg Tabs (Prochlorperazine Maleate) .[name].. 1 By Mouth Two Times A Day As Needed Nausea 23)  Omeprazole 40 Mg Cpdr (Omeprazole) .[name].. 1 By Mouth Qam For Indigestion 24)  Relion Pen Needles 31g X 8 Mm Misc (Insulin Pen Needle) .... Use Bid 25)  Amlodipine Besy-Benazepril Hcl 5-10 Mg Caps (Amlodipine Besy-Benazepril Hcl) .[name].. 1 By Mouth Qd  Allergies (verified): 1)  ! Sulfa 2)  ! Avelox (Moxifloxacin Hcl) 3)  ! Effexor 4)  Savella (Milnacipran Hcl) 5)  Duragesic-50 (Fentanyl)  Past History:  Past Medical History: Last updated: 08/10/2009 Depression Diabetes mellitus, type  I Hypertension Chronic pain FMS Migraines Breast cancer, hx of - right chronic pancreatitis Anxiety adrenal insufficiency Vit D deficiency GERD L 3d finger dislocated 2009 COPD Anemia-NOS Sternum fracture 11/2008  Osteoporosis (Reclast too expensive $900) Fibromyalgia Hx of pancreatitis Barretts Espohagus 2005 Chronic fatigue  Past Surgical History: Last updated: 05/09/2007 Sinus surgery 40% Part Pancreatecomty Cholecystectomy  Social History: Last updated: 08/29/2009 Did not go back to work Oct 23, Lindsey Gomez is very ill - unable to have financial ends meet, no income 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  The patient denies fever, weight loss, dyspnea on exertion, and abdominal pain.         HAs  Physical Exam  General:  Less anxious and less teary NAD Head:  R scull base posteriorly is a little tender Eyes:  vision grossly intact, pupils equal, and pupils round.   Ears:  R ear normal and L ear normal.   Nose:  External nasal examination shows no deformity or inflammation. Nasal mucosa are pink and moist without lesions or exudates. Mouth:  Oral mucosa and oropharynx without lesions or exudates.  Teeth in good repair. Neck:  No deformities, masses, or tenderness noted. Lungs:  clear bilaterally without rhonchi, wheezes or crackles  Heart:  RRR, no rubs, gallops or murmurs Abdomen:  S/NT Msk:  Lumbar-sacral spine is tender to palpation over paraspinal muscles and painfull with the ROM   Extremities:  no edema, no erythema  Neurologic:  alert & oriented X3.   Skin:  no rash B feet ok R hand 2 mm splinter Psych:  Not  tearful, not anxious with less  depressed affect.     Impression & Recommendations:  Problem # 1:  FATIGUE (ICD-780.79) multifactorial Assessment Unchanged The labs were reviewed with the patient.   Problem # 2:  DIABETES MELLITUS, TYPE I (ICD-250.01) Assessment: Unchanged  Her updated medication list for this problem includes:    Apidra Solostar 100 Unit/ml Soln (Insulin glulisine) .Marland KitchenMarland KitchenMarland KitchenMarland Kitchen 4 to 6 units daily as directed    Levemir Flexpen 100 Unit/ml Soln (Insulin detemir) .Marland KitchenMarland KitchenMarland KitchenMarland Kitchen 18 u in am and 14-16 u in pm    Amlodipine Besy-benazepril Hcl 5-10 Mg Caps (Amlodipine besy-benazepril hcl) .[name]... 1 by mouth qd  Problem # 3:   MIGRAINE VARIANTS, W/INTRACTABLE MIGRAINE (ICD-346.21) Assessment: Deteriorated  Her updated medication list for this problem includes:    Oxycontin 40 Mg Tb12 (Oxycodone hcl) .[name]... 1 by mouth three times a day    Oxycodone Hcl 15 Mg Tabs (Oxycodone hcl) .[name]... 1 qid as needed pain.    Atenolol 100 Mg Tabs (Atenolol) .[name]... 1  po qam    Zomig 5 Mg Tabs (Zolmitriptan) .[name]... 1 once daily as needed migraine    Meperidine Hcl 50 Mg Tabs (Meperidine hcl) .[name]... 1 by mouth two times a day as needed severe pain breakthrough  Problem # 4:  COPD (ICD-496) Assessment: Unchanged  Problem # 5:  ANXIETY (ICD-300.00) Assessment: Unchanged  Her updated medication list for this problem  includes:    Citalopram Hydrobromide 20 Mg Tabs (Citalopram hydrobromide) .[name]... 1-2 once daily    Lorazepam 2 Mg Tabs (Lorazepam) .[name]... Three times a day prn    Amitriptyline Hcl 50 Mg Tabs (Amitriptyline hcl) .[name]... 1 by mouth q hs  Problem # 6:  FIBROMYALGIA (ICD-729.1) Assessment: Unchanged  Her updated medication list for this problem includes:    Oxycontin 40 Mg Tb12 (Oxycodone hcl) .[name]... 1 by mouth three times a day    Oxycodone Hcl 15 Mg Tabs (Oxycodone hcl) .[name]... 1 qid as needed pain.    Meperidine Hcl 50 Mg Tabs (Meperidine hcl) .[name]... 1 by mouth two times a day as needed severe pain breakthrough  Problem # 7:  HYPERTENSION (ICD-401.9) Assessment: Improved  Her updated medication list for this problem includes:    Atenolol 100 Mg Tabs (Atenolol) .[name]... 1  po qam    Amlodipine Besy-benazepril Hcl 5-10 Mg Caps (Amlodipine besy-benazepril hcl) .[name]... 1 by mouth qd  BP today: 100/70 Prior BP: 110/68 (08/10/2009)  Labs Reviewed: K+: 4.2 (08/02/2009) Creat: : 0.8 (08/02/2009)   Chol: 218 (03/22/2008)   HDL: 58.5 (03/22/2008)   LDL: DEL (03/22/2008)   TG: 313 (03/22/2008)  Problem # 8:  TOBACCO USE DISORDER/SMOKER-SMOKING CESSATION DISCUSSED (ICD-305.1) Assessment: Improved  Encouraged smoking cessation and  discussed different methods for smoking cessation.   Problem # 9:  HAND PAIN (ICD-729.5) R - splinter Procedure: R hand splinter removal. Risks vs benefits and controversies of a long term controlled substances use were discussed.    R hand splinter was removed in usual fasion. Tolerated well. Complicatons - none. Good pain relief following the procedure. Bandaid and antibiotic oint applied.  Orders: Incision and Removal of Foreign Body, subq tissues; simple (10120)  Complete Medication List: 1)  Adderall 30 Mg Tabs (Amphetamine-dextroamphetamine) .[name].. 1 by mouth bid 2)  Oxycontin 40 Mg Tb12 (Oxycodone hcl) .[name].. 1 by mouth three times a day 3)  Oxycodone Hcl 15 Mg Tabs (Oxycodone hcl) .[name].. 1 qid as needed pain. 4)  Tamoxifen Citrate 20 Mg Tabs (Tamoxifen citrate) .[name].. 1 bid 5)  Atenolol 100 Mg Tabs (Atenolol) .[name].. 1  po qam 6)  Prilosec Otc 20 Mg Tbec (Omeprazole magnesium) .... 2 daily 7)  Citalopram Hydrobromide 20 Mg Tabs (Citalopram hydrobromide) .[name].. 1-2 once daily 8)  Lorazepam 2 Mg Tabs (Lorazepam) .... Three times a day prn 9)  Cortef 10 Mg Tabs (Hydrocortisone) .[name].. 1 two times a day am and lunch 10)  Zomig 5 Mg Tabs (Zolmitriptan) .[name].. 1 once daily as needed migraine 11)  Nasonex 50 Mcg/act Susp (Mometasone furoate) .[name].. 1-2 each nostril qd 12)  Rocaltrol 0.5 Mcg Caps (Calcitriol) .[name].. 1 by mouth qd 13)  Topamax 100 Mg Tabs (Topiramate) .[name].. 1 by mouth bid 14)  Voltaren 1 % Gel (Diclofenac sodium) .... Two times a day  to qid as needed 15)  Ranitidine Hcl 300 Mg Tabs (Ranitidine hcl) .[name].. 1 po qd 16)  Triamcinolone Acetonide 0.1 % Oint (Triamcinolone acetonide) .... Use two times a day prn45 17)  Accu-chek Aviva Strp (Glucose blood) .... Use two times a day or more 18)  Apidra Solostar 100 Unit/ml Soln (Insulin glulisine) .... 4 to 6 units daily as directed 19)  Levemir Flexpen 100 Unit/ml Soln (Insulin detemir) .[name].. 18 u in am and 14-16 u in pm 20)  Meperidine Hcl 50 Mg Tabs  (Meperidine hcl) .[name].. 1 by mouth two times a day as needed severe pain breakthrough 21)  Amitriptyline  Hcl 50 Mg Tabs (Amitriptyline hcl) .[name].. 1 by mouth q hs 22)  Prochlorperazine Maleate 10 Mg Tabs (Prochlorperazine maleate) .[name].. 1 by mouth two times a day as needed nausea 23)  Omeprazole 40 Mg Cpdr (Omeprazole) .[name].. 1 by mouth qam for indigestion 24)  Amlodipine Besy-benazepril Hcl 5-10 Mg Caps (Amlodipine besy-benazepril hcl) .[name].. 1 by mouth qd 25)  Bd Pen Needle Short U/f 31g X 8 Mm Misc (Insulin pen needle) .... As dirr 26)  Freestyle Lancets Misc (Lancets) .... As dirr 27)  Accu-chek Soft Touch Lancets Misc (Lancets) .... As dirr  Other Orders: Tdap => 76yrs IM (81191) Admin 1st Vaccine (47829) Admin 1st Vaccine [facility]) [phone])  Patient Instructions: 1)  Please schedule a follow-up appointment in 1 month. 2)  BMP prior to visit, ICD-9: 3)  Hepatic Panel prior to visit, ICD-9: 4)  HbgA1C prior to visit, ICD-9:250.01  995.20 Prescriptions: PROCHLORPERAZINE MALEATE 10 MG TABS (PROCHLORPERAZINE MALEATE) 1 by mouth two times a day as needed nausea  #60 x 3   Entered and Authorized by:   [name] MD   Signed by:   [name] MD on 08/29/2009   Method used:   Print then Give to Patient   RxID:   [id number] TOPAMAX 100 MG  TABS (TOPIRAMATE) 1 by mouth bid  #60 Each x 6   Entered and Authorized by:   [name] MD   Signed by:   [name] MD on 08/29/2009   Method used:   Print then Give to Patient   RxID:   [id number] ROCALTROL 0.5 MCG CAPS (CALCITRIOL) 1 by mouth qd  #30 x 12   Entered and Authorized by:   [name] MD   Signed by:   [name] MD on 08/29/2009   Method used:   Print then Give to Patient   RxID:   [id number] NASONEX 50 MCG/ACT SUSP (MOMETASONE FUROATE) 1-2 each nostril qd  #1 x 12   Entered and Authorized by:   [name] MD   Signed by:   [name] MD on 08/29/2009    Method used:   Print then Give to Patient   RxID:   [id number] LORAZEPAM 2 MG TABS (LORAZEPAM) three times a day prn  #90 x 6   Entered and Authorized by:   [name] MD   Signed by:   [name] MD on 08/29/2009   Method used:   Print then Give to Patient   RxID:   [id number] ACCU-CHEK SOFT TOUCH LANCETS  MISC (LANCETS) as dirr  #100 x 11   Entered and Authorized by:   [name] MD   Signed by:   [name] MD on 08/29/2009   Method used:   Print then Give to Patient   RxID:   [id number] OXYCODONE HCL 15 MG  TABS (OXYCODONE HCL) 1 qid as needed pain.  #120 x 0   Entered and Authorized by:   [name] MD   Signed by:   [name] MD on 08/29/2009   Method used:   Print then Give to Patient   RxID:   [id number] OXYCONTIN 40 MG  TB12 (OXYCODONE HCL) 1 by mouth three times a day  #90 x 0   Entered and Authorized by:   [name] MD   Signed by:   [name] MD on 08/29/2009   Method used:   Print then  Give to Patient   RxID:   [id number] ADDERALL 30 MG TABS (AMPHETAMINE-DEXTROAMPHETAMINE) 1 by mouth bid  #60 x 0   Entered and Authorized by:   [name] MD   Signed by:   [name] MD on 08/29/2009   Method used:   Print then Give to Patient   RxID:   [id number] FREESTYLE LANCETS  MISC (LANCETS) as dirr  #100 x 11   Entered and Authorized by:   [name] MD   Signed by:   [name] MD on 08/29/2009   Method used:   Print then Give to Patient   RxID:   [id number] BD PEN NEEDLE SHORT U/F 31G X 8 MM MISC (INSULIN PEN NEEDLE) as dirr  #100 x 12   Entered and Authorized by:   [name] MD   Signed by:   [name] MD on 08/29/2009   Method used:   Print then Give to Patient   RxID:   [id number]    Tetanus/Td Vaccine    Vaccine Type: Tdap    Site: left deltoid    Mfr: GlaxoSmithKline    Dose: 0.5 ml     Route: IM    Given by: Lucious Groves    Exp. Date: 06/04/2011    Lot #: [id number]    VIS given: 01/28/07 version given August 29, 2009.

## 2010-04-11 NOTE — Progress Notes (Signed)
Summary: lorazapam  Phone Note Other Incoming Call back at fax   Caller: [place] Summary of Call: refill on larazepam  ok x 6 refills---will fax to pharmacy/vg Initial call taken by: Tora Perches,  April 06, 2009 4:37 PM    Prescriptions: LORAZEPAM 2 MG TABS (LORAZEPAM) three times a day prn  #90 x 6   Entered by:   Tora Perches   Authorized by:   [name] MD   Signed by:   Tora Perches on 04/06/2009   Method used:   Telephoned to ...       [facility]* (retail)       [address]       [place], [place]  [id number]       Ph: [phone]       Fax: [phone]   RxID:   [id number]

## 2010-04-13 ENCOUNTER — Ambulatory Visit (INDEPENDENT_AMBULATORY_CARE_PROVIDER_SITE_OTHER): Payer: BC Managed Care – PPO | Admitting: Professional

## 2010-04-13 DIAGNOSIS — F331 Major depressive disorder, recurrent, moderate: Secondary | ICD-10-CM

## 2010-04-13 NOTE — Assessment & Plan Note (Signed)
Summary: FU Natale Milch  PT AWARE APPT IS AT 2:00/[facility]   Vital Signs:  Patient profile:   65 year old female Height:      62.5 inches Weight:      122 pounds BMI:     22.04 Temp:     99.3 degrees F oral Pulse rate:   96 / minute Pulse rhythm:   regular Resp:     16 per minute BP sitting:   126 / 80  (left arm) Cuff size:   regular  Vitals Entered By: Lanier Prude, CMA(AAMA) (March 29, 2010 2:16 PM) CC: f/u  Is Patient Diabetic? Yes Comments pt is not taking Voltaren or triamcinolone   Primary Care Provider:  [name] MD  CC:  f/u .  History of Present Illness: The patient presents for a follow up of FMS pain, DM, anxiety, depression and headaches.  She has not been able to get by with Oxycod 2 a day. The patient presents with complaints of sore throat, fever, cough, sinus congestion and drainge of 2 wks duration. Not better with OTC meds. Chest hurts with coughing. Can't sleep due to cough. Muscle aches are present.  The mucus is colored.   Current Medications (verified): 1)  Oxycontin 20 Mg Xr12h-Tab (Oxycodone Hcl) .[name].. 1 By Mouth Bid 2)  Oxycodone Hcl 15 Mg  Tabs (Oxycodone Hcl) .[name].. 1 Bid As Needed Pain. 3)  Tamoxifen Citrate 20 Mg  Tabs (Tamoxifen Citrate) .[name].. 1 Bid 4)  Atenolol 100 Mg Tabs (Atenolol) .[name].. 1  Po Qam 5)  Prilosec Otc 20 Mg Tbec (Omeprazole Magnesium) .... 2 Daily 6)  Amitriptyline Hcl 50 Mg Tabs (Amitriptyline Hcl) .... 1/2 or 1 Tab By Mouth Q Hs 7)  Lorazepam 2 Mg Tabs (Lorazepam) .... Three Times A Day Prn 8)  Cortef 10 Mg  Tabs (Hydrocortisone) .[name].. 1 Two Times A Day Am and Lunch 9)  Zomig 5 Mg  Tabs (Zolmitriptan) .[name].. 1 Once Daily As Needed Migraine 10)  Nasonex 50 Mcg/act Susp (Mometasone Furoate) .[name].. 1-2 Each Nostril Qd 11)  Rocaltrol 0.5 Mcg Caps (Calcitriol) .[name].. 1 By Mouth Bid 12)  Topamax 100 Mg  Tabs (Topiramate) .[name].. 1 By Mouth Bid 13)  Voltaren 1 %  Gel (Diclofenac Sodium) .... Two Times A Day  To Qid As Needed 14)  Ranitidine  Hcl 300 Mg Tabs (Ranitidine Hcl) .[name].. 1 Po Qd 15)  Triamcinolone Acetonide 0.1 % Oint (Triamcinolone Acetonide) .... Use Two Times A Day Prn45 16)  Accu-Chek Aviva  Strp (Glucose Blood) .... Use Two Times A Day or More 17)  Apidra Solostar 100 Unit/ml Soln (Insulin Glulisine) .... 4 To 6 Units Daily As Directed 18)  Levemir Flexpen 100 Unit/ml  Soln (Insulin Detemir) .[name].. 18 U in Am and 14-16 U in Pm 19)  Amlodipine Besy-Benazepril Hcl 5-10 Mg Caps (Amlodipine Besy-Benazepril Hcl) .[name].. 1 By Mouth Qd 20)  Bd Pen Needle Short U/f 31g X 8 Mm Misc (Insulin Pen Needle) .... As Dirr 21)  Freestyle Lancets  Misc (Lancets) .... As Dirr 22)  Accu-Chek Soft Touch Lancets  Misc (Lancets) .... As Dirr 23)  Promethazine Hcl 25 Mg Tabs (Promethazine Hcl) .[name].. 1-2 By Mouth Four Times A Day As Needed Nausea 24)  Protonix 40 Mg Tbec (Pantoprazole Sodium) .[name].. 1 By Mouth Qam For Indigestion 25)  Relpax 40 Mg Tabs (Eletriptan Hydrobromide) .[name].. 1 By Mouth Once Daily As Needed Migraine 26)  Topamax 200 Mg Tabs (Topiramate) .[name].. 1 By Mouth Two  Times A Day For Migraine Prevention 27)  Prochlorperazine Maleate 10 Mg Tabs (Prochlorperazine Maleate) .[name].. 1 By Mouth Two Times A Day 28)  Citalopram Hydrobromide 40 Mg Tabs (Citalopram Hydrobromide) .[name].. 1 By Mouth Qd 29)  Accu-Chek Aviva  Strp (Glucose Blood) .... Check Qid 30)  Amoxicillin 500 Mg Caps (Amoxicillin) .... 2 Caps By Mouth Bid  Allergies (verified): 1)  ! Sulfa 2)  ! Avelox (Moxifloxacin Hcl) 3)  ! Effexor 4)  Savella (Milnacipran Hcl) 5)  Duragesic-50 (Fentanyl) 6)  Maxalt 7)  Viibryd (Vilazodone Hcl)  Past History:  Past Medical History: Last updated: 12/23/2009 Depression Diabetes mellitus, type I Hypertension Chronic pain FMS Migraines Breast cancer, hx of - right chronic pancreatitis Anxiety adrenal insufficiency Vit D deficiency GERD L 3d finger dislocated 2009 COPD Anemia-NOS Sternum fracture 11/2008  Osteoporosis (Reclast too  expensive $900) Fibromyalgia Hx of pancreatitis Barretts Espohagus 2005 Chronic fatigue Osteoporosis  Social History: Last updated: 08/29/2009 Did not go back to work Oct 23, [name] is very ill - unable to have financial ends meet, no income 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 Family History Hypertension father with COPD  Social History: Reviewed history from 08/29/2009 and no changes required. 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  The patient denies severe indigestion/heartburn.    Physical Exam  General:  NAD Head:  NT Nose:  External nasal examination shows no deformity or inflammation. Nasal mucosa are pink and moist without lesions or exudates. Mouth:  Oral mucosa and oropharynx without lesions or exudates.  Teeth in good repair. Neck:  No deformities, masses, or tenderness noted. Lungs:  clear bilaterally without rhonchi, wheezes or crackles  Heart:  RRR, no rubs, gallops or murmurs Abdomen:  S/NT Msk:  Cervical spine is tender to palpation over paraspinal muscles and with the ROM  Extremities:  no edema, no erythema  Feet are examined  Neurologic:  alert & oriented X3.   Skin:  no rash B feet ok w/o ulcers R inner heel w/a 1.5 cm bluish NT area  Psych:  Not  tearful, not anxious with less  depressed affect.  not suicidal and not homicidal.     Impression & Recommendations:  Problem # 1:  DIABETES MELLITUS, TYPE I (ICD-250.01) Assessment Unchanged  Her updated  medication list for this problem includes:    Apidra Solostar 100 Unit/ml Soln (Insulin glulisine) .Marland KitchenMarland KitchenMarland KitchenMarland Kitchen 4 to 6 units daily as directed    Levemir Flexpen 100 Unit/ml Soln (Insulin detemir) .Marland KitchenMarland KitchenMarland KitchenMarland Kitchen 18 u in am and 14-16 u in pm    Amlodipine Besy-benazepril Hcl 5-10 Mg Caps (Amlodipine besy-benazepril hcl) .[name]... 1 by mouth qd  Problem # 2:  COMMON MIGRAINE (ICD-346.10) Assessment: Deteriorated  Her updated medication list for this problem includes:    Oxycontin 20 Mg Xr12h-tab (Oxycodone hcl) .[name]... 1 by mouth bid    Oxycodone Hcl 15 Mg Tabs (Oxycodone hcl) .[name]... 1 bid or tid as needed for  pain.    Atenolol 100 Mg Tabs (Atenolol) .[name]... 1  po qam  Zomig 5 Mg Tabs (Zolmitriptan) .[name]... 1 once daily as needed migraine    Relpax 40 Mg Tabs (Eletriptan hydrobromide) .[name]... 1 by mouth once daily as needed migraine  Problem # 3:  FIBROMYALGIA (ICD-729.1) Assessment: Deteriorated  Her updated medication list for this problem includes:    Oxycontin 20 Mg Xr12h-tab (Oxycodone hcl) .[name]... 1 by mouth bid    Oxycodone Hcl 15 Mg Tabs (Oxycodone hcl) .[name]... 1 bid or tid as needed for  pain.  Problem # 4:  ANXIETY (ICD-300.00) Assessment: Unchanged  Her updated medication list for this problem includes:    Amitriptyline Hcl 50 Mg Tabs (Amitriptyline hcl) .[name]... 1/2 or 1 tab by mouth q hs    Lorazepam 2 Mg Tabs (Lorazepam) .[name]... Three times a day prn    Citalopram Hydrobromide 40 Mg Tabs (Citalopram hydrobromide) .[name]... 1 by mouth qd  Problem # 5:  DEPRESSION (ICD-311) Assessment: Deteriorated  Her updated medication list for this problem includes:    Amitriptyline Hcl 50 Mg Tabs (Amitriptyline hcl) .[name]... 1/2 or 1 tab by mouth q hs    Lorazepam 2 Mg Tabs (Lorazepam) .[name]... Three times a day prn    Citalopram Hydrobromide 40 Mg Tabs (Citalopram hydrobromide) .[name]... 1 by mouth qd  Problem # 6:  BRONCHITIS (ICD-490) Assessment: New  The following medications were removed from the medication list:     Amoxicillin 500 Mg Caps (Amoxicillin) .[name]... 2 caps by mouth bid Her updated medication list for this problem includes:    Augmentin 875-125 Mg Tabs (Amoxicillin-pot clavulanate) .[name]... 1 by mouth bid  Complete Medication List: 1)  Oxycontin 20 Mg Xr12h-tab (Oxycodone hcl) .[name].. 1 by mouth bid 2)  Oxycodone Hcl 15 Mg Tabs (Oxycodone hcl) .[name].. 1 bid or tid as needed for  pain. 3)  Tamoxifen Citrate 20 Mg Tabs (Tamoxifen citrate) .[name].. 1 bid 4)  Atenolol 100 Mg Tabs (Atenolol) .[name].. 1  po qam 5)  Prilosec Otc 20 Mg Tbec (Omeprazole magnesium) .... 2 daily 6)  Amitriptyline Hcl 50 Mg Tabs (Amitriptyline hcl) .... 1/2 or 1 tab by mouth q hs 7)  Lorazepam 2 Mg Tabs (Lorazepam) .... Three times a day prn 8)  Cortef 10 Mg Tabs (Hydrocortisone) .[name].. 1 two times a day am and lunch 9)  Zomig 5 Mg Tabs (Zolmitriptan) .[name].. 1 once daily as needed migraine 10)  Nasonex 50 Mcg/act Susp (Mometasone furoate) .[name].. 1-2 each nostril qd 11)  Rocaltrol 0.5 Mcg Caps (Calcitriol) .[name].. 1 by mouth bid 12)  Topamax 100 Mg Tabs (Topiramate) .[name].. 1 by mouth bid 13)  Voltaren 1 % Gel (Diclofenac sodium) .... Two times a day  to qid as needed 14)  Ranitidine Hcl 300 Mg Tabs (Ranitidine hcl) .[name].. 1 po qd 15)  Triamcinolone Acetonide 0.1 % Oint (Triamcinolone acetonide) .... Use two times a day prn45 16)  Accu-chek Aviva Strp (Glucose blood) .... Use two times a day or more 17)  Apidra Solostar 100 Unit/ml Soln (Insulin glulisine) .... 4 to 6 units daily as directed 18)  Levemir Flexpen 100 Unit/ml Soln (Insulin detemir) .[name].. 18 u in am and 14-16 u in pm 19)  Amlodipine Besy-benazepril Hcl 5-10 Mg Caps (Amlodipine besy-benazepril hcl) .[name].. 1 by mouth qd 20)  Bd Pen Needle Short U/f 31g X 8 Mm Misc (Insulin pen needle) .... As dirr 21)  Freestyle Lancets Misc (Lancets) .... As dirr 22)  Accu-chek Soft Touch Lancets Misc (Lancets) .... As dirr 23)  Promethazine Hcl 25 Mg Tabs (Promethazine  hcl) .... 1-2 by mouth four times a day  as needed nausea 24)  Protonix 40 Mg Tbec (Pantoprazole sodium) .[name].. 1 by mouth qam for indigestion 25)  Relpax 40 Mg Tabs (Eletriptan hydrobromide) .[name].. 1 by mouth once daily as needed migraine 26)  Topamax 200 Mg Tabs (Topiramate) .[name].. 1 by mouth two times a day for migraine prevention 27)  Prochlorperazine Maleate 10 Mg Tabs (Prochlorperazine maleate) .[name].. 1 by mouth two times a day 28)  Citalopram Hydrobromide 40 Mg Tabs (Citalopram hydrobromide) .[name].. 1 by mouth qd 29)  Accu-chek Aviva Strp (Glucose blood) .... Check qid 30)  Augmentin 875-125 Mg Tabs (Amoxicillin-pot clavulanate) .[name].. 1 by mouth bid  Patient Instructions: 1)  Please schedule a follow-up appointment in 1 month. Prescriptions: LORAZEPAM 2 MG TABS (LORAZEPAM) three times a day prn  #90 x 6   Entered and Authorized by:   [name] MD   Signed by:   [name] MD on 03/29/2010   Method used:   Print then Give to Patient   RxID:   [id number] OXYCONTIN 20 MG XR12H-TAB (OXYCODONE HCL) 1 by mouth bid  #60 x 0   Entered and Authorized by:   [name] MD   Signed by:   [name] MD on 03/29/2010   Method used:   Print then Give to Patient   RxID:   [id number] AUGMENTIN 875-125 MG TABS (AMOXICILLIN-POT CLAVULANATE) 1 by mouth bid  #20 x 0   Entered and Authorized by:   [name] MD   Signed by:   [name] MD on 03/29/2010   Method used:   Print then Give to Patient   RxID:   [id number] OXYCODONE HCL 15 MG  TABS (OXYCODONE HCL) 1 bid or tid as needed for  pain.  #90 x 0   Entered and Authorized by:   [name] MD   Signed by:   [name] MD on 03/29/2010   Method used:   Print then Give to Patient   RxID:   [id number]    Orders Added: 1)  Est. Patient Level IV [42595]

## 2010-04-13 NOTE — Progress Notes (Signed)
Summary: OXY INCREASE?   Phone Note Call from Patient Call back at [phone]   Summary of Call: Pt has not been able to "stick" to taking oxycodone 15mg  1 two times a day. She would like rx for 1 four times a day. C/o increase in h/a's & pain, unable to get out of bed w/out increase in pain med.  Initial call taken by: Lamar Sprinkles, CMA,  March 09, 2010 10:35 AM  Follow-up for Phone Call        Please stay with two times a day regimen - it is important Take Advil or Tylenol as needed pls Follow-up by: [name] MD,  March 09, 2010 12:58 PM  Additional Follow-up for Phone Call Additional follow up Details #1::        Pt came in office, spoke w/her regarding above. She is out of oxydcodone 15mg  due to increasing med. Per pt, MD agreed at previous office visit to increase back to previous dose if needed. She has had increase in stress due to family issues which she relates to triggering her h/a's/pain. She continues to see Dr Luiz Blare once a week.   She is out of med. I have rx to be filled Jan 3rd, I can shred & redo rx if needed.   Per pharm pt filled oxycodone 15mg  #60  AND oxycontin 20mg  #90 on 12/4. Additional Follow-up by: Lamar Sprinkles, CMA,  March 09, 2010 4:32 PM    Additional Follow-up for Phone Call Additional follow up Details #2::    ok to fill early Follow-up by: [name] MD,  March 10, 2010 1:16 PM  Additional Follow-up for Phone Call Additional follow up Details #3:: Details for Additional Follow-up Action Taken: Pt informed, returned the rx pt gave me yesterday.  Additional Follow-up by: Lamar Sprinkles, CMA,  March 10, 2010 2:12 PM

## 2010-04-13 NOTE — Assessment & Plan Note (Signed)
Summary: 1 MO ROV /N[facility]  # rs'd due to migraine/nws   Vital Signs:  Patient profile:   65 year old female Height:      62.5 inches Weight:      122 pounds BMI:     22.04 Temp:     98.6 degrees F oral Pulse rate:   84 / minute Pulse rhythm:   regular Resp:     16 per minute BP sitting:   114 / 86  (left arm) Cuff size:   regular  Vitals Entered By: Lanier Prude, Beverly Gust) (February 27, 2010 2:22 PM) CC: 1 mo f/u Is Patient Diabetic? Yes   Primary Care Florence Antonelli:  [name] MD  CC:  1 mo f/u.  History of Present Illness: The patient presents for a follow up of back pain, anxiety, depression and headaches and DM. Her CBG was 340 and she took 3 u Apidra and 20 u Levimir and CBG came down to 35 the other day....  Allergies: 1)  ! Sulfa 2)  ! Avelox (Moxifloxacin Hcl) 3)  ! Effexor 4)  Savella (Milnacipran Hcl) 5)  Duragesic-50 (Fentanyl) 6)  Maxalt 7)  Viibryd (Vilazodone Hcl)  Past History:  Past Medical History: Last updated: 12/23/2009 Depression Diabetes mellitus, type I Hypertension Chronic pain FMS Migraines Breast cancer, hx of - right chronic pancreatitis Anxiety adrenal insufficiency Vit D deficiency GERD L 3d finger dislocated 2009 COPD Anemia-NOS Sternum fracture 11/2008  Osteoporosis (Reclast too expensive $900) Fibromyalgia Hx of pancreatitis Barretts Espohagus 2005 Chronic fatigue Osteoporosis  Past Surgical History: Last updated: 05/09/2007 Sinus surgery 40% Part Pancreatecomty Cholecystectomy  Social History: Last updated: 08/29/2009 Did not go back to work Oct 23, [name] is very ill - unable to have financial ends meet, no income 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    Physical Exam  General:  NAD Head:   NT Eyes:  vision grossly intact, pupils equal, and pupils round.   Nose:  External nasal examination shows no deformity or inflammation. Nasal mucosa are pink and moist without lesions or exudates. Mouth:  Oral mucosa and oropharynx without lesions or exudates.  Teeth in good repair. Lungs:  clear bilaterally without rhonchi, wheezes or crackles  Heart:  RRR, no rubs, gallops or murmurs Abdomen:  S/NT Msk:  Cervical spine is tender to palpation over paraspinal muscles and with the ROM  Extremities:  no edema, no erythema  Feet are examined  Neurologic:  alert & oriented X3.   Skin:  no rash B feet ok w/o ulcers  Cervical Nodes:  No lymphadenopathy noted Psych:  Not  tearful, not anxious with less  depressed affect.  not suicidal and not homicidal.     Impression & Recommendations:  Problem # 1:  DIABETES MELLITUS, TYPE I (ICD-250.01) Assessment Deteriorated Meals and Ins coverage discussed; hypoglycemia prevention discussed Her updated medication list for this problem includes:    Apidra Solostar 100 Unit/ml Soln (Insulin glulisine) .Marland KitchenMarland KitchenMarland KitchenMarland Kitchen 4 to 6 units daily as directed    Levemir Flexpen 100 Unit/ml Soln (Insulin detemir) .Marland KitchenMarland KitchenMarland KitchenMarland Kitchen 18 u in am and 14-16 u in pm    Amlodipine Besy-benazepril Hcl 5-10 Mg Caps (Amlodipine besy-benazepril hcl) .[name]... 1 by mouth qd  Problem # 2:  COMMON MIGRAINE (ICD-346.10) Assessment: Improved  Her updated medication list for this problem includes:  Oxycontin 20 Mg Xr12h-tab (Oxycodone hcl) .[name]... 1 by mouth bid    Oxycodone Hcl 15 Mg Tabs (Oxycodone hcl) .[name]... 1 bid as needed pain.    Atenolol 100 Mg Tabs (Atenolol) .[name]... 1  po qam    Zomig 5 Mg Tabs (Zolmitriptan) .[name]... 1 once daily as needed migraine    Relpax 40 Mg Tabs (Eletriptan hydrobromide) .[name]... 1 by mouth once daily as needed migraine  Problem # 3:  HYPERTENSION (ICD-401.9) Assessment: Unchanged  Her updated medication list for this problem includes:    Atenolol 100 Mg Tabs (Atenolol)  .[name]... 1  po qam    Amlodipine Besy-benazepril Hcl 5-10 Mg Caps (Amlodipine besy-benazepril hcl) .[name]... 1 by mouth qd  Problem # 4:  FIBROMYALGIA (ICD-729.1) Assessment: Unchanged  Her updated medication list for this problem includes:    Oxycontin 20 Mg Xr12h-tab (Oxycodone hcl) .[name]... 1 by mouth bid    Oxycodone Hcl 15 Mg Tabs (Oxycodone hcl) .[name]... 1 bid as needed pain.  Problem # 5:  TOBACCO USE DISORDER/SMOKER-SMOKING CESSATION DISCUSSED (ICD-305.1) Assessment: Unchanged  Encouraged smoking cessation and discussed different methods for smoking cessation.   Problem # 6:  DIARRHEA, ACUTE (ICD-787.91) Assessment: Improved  Problem # 7:  SOMNOLENCE (ICD-780.09) Assessment: Improved  Complete Medication List: 1)  Oxycontin 20 Mg Xr12h-tab (Oxycodone hcl) .[name].. 1 by mouth bid 2)  Oxycodone Hcl 15 Mg Tabs (Oxycodone hcl) .[name].. 1 bid as needed pain. 3)  Tamoxifen Citrate 20 Mg Tabs (Tamoxifen citrate) .[name].. 1 bid 4)  Atenolol 100 Mg Tabs (Atenolol) .[name].. 1  po qam 5)  Prilosec Otc 20 Mg Tbec (Omeprazole magnesium) .... 2 daily 6)  Amitriptyline Hcl 50 Mg Tabs (Amitriptyline hcl) .... 1/2 or 1 tab by mouth q hs 7)  Lorazepam 2 Mg Tabs (Lorazepam) .... Three times a day prn 8)  Cortef 10 Mg Tabs (Hydrocortisone) .[name].. 1 two times a day am and lunch 9)  Zomig 5 Mg Tabs (Zolmitriptan) .[name].. 1 once daily as needed migraine 10)  Nasonex 50 Mcg/act Susp (Mometasone furoate) .[name].. 1-2 each nostril qd 11)  Rocaltrol 0.5 Mcg Caps (Calcitriol) .[name].. 1 by mouth bid 12)  Topamax 100 Mg Tabs (Topiramate) .[name].. 1 by mouth bid 13)  Voltaren 1 % Gel (Diclofenac sodium) .... Two times a day  to qid as needed 14)  Ranitidine Hcl 300 Mg Tabs (Ranitidine hcl) .[name].. 1 po qd 15)  Triamcinolone Acetonide 0.1 % Oint (Triamcinolone acetonide) .... Use two times a day prn45 16)  Accu-chek Aviva Strp (Glucose blood) .... Use two times a day or more 17)  Apidra Solostar 100 Unit/ml Soln (Insulin glulisine) .... 4 to 6 units  daily as directed 18)  Levemir Flexpen 100 Unit/ml Soln (Insulin detemir) .[name].. 18 u in am and 14-16 u in pm 19)  Amlodipine Besy-benazepril Hcl 5-10 Mg Caps (Amlodipine besy-benazepril hcl) .[name].. 1 by mouth qd 20)  Bd Pen Needle Short U/f 31g X 8 Mm Misc (Insulin pen needle) .... As dirr 21)  Freestyle Lancets Misc (Lancets) .... As dirr 22)  Accu-chek Soft Touch Lancets Misc (Lancets) .... As dirr 23)  Promethazine Hcl 25 Mg Tabs (Promethazine hcl) .[name].. 1-2 by mouth four times a day as needed nausea 24)  Protonix 40 Mg Tbec (Pantoprazole sodium) .[name].. 1 by mouth qam for indigestion 25)  Relpax 40 Mg Tabs (Eletriptan hydrobromide) .[name].. 1 by mouth once daily as needed migraine 26)  Topamax 200 Mg Tabs (Topiramate) .[name].. 1 by mouth two times a day  for migraine prevention 27)  Prochlorperazine Maleate 10 Mg Tabs (Prochlorperazine maleate) .[name].. 1 by mouth two times a day 28)  Citalopram Hydrobromide 40 Mg Tabs (Citalopram hydrobromide) .[name].. 1 by mouth qd 29)  Accu-chek Aviva Strp (Glucose blood) .... Check qid  Patient Instructions: 1)  Please schedule a follow-up appointment in 1 month. Prescriptions: ACCU-CHEK AVIVA  STRP (GLUCOSE BLOOD) check qid  #100 x 11   Entered and Authorized by:   [name] MD   Signed by:   [name] MD on 02/27/2010   Method used:   Print then Give to Patient   RxID:   [id number] ACCU-CHEK SOFT TOUCH LANCETS  MISC (LANCETS) as dirr  #100 x 11   Entered and Authorized by:   [name] MD   Signed by:   [name] MD on 02/27/2010   Method used:   Print then Give to Patient   RxID:   [id number] OXYCODONE HCL 15 MG  TABS (OXYCODONE HCL) 1 bid as needed pain.  #60 x 0   Entered and Authorized by:   [name] MD   Signed by:   [name] MD on 02/27/2010   Method used:   Print then Give to Patient   RxID:   [id number] OXYCONTIN 20 MG XR12H-TAB (OXYCODONE HCL) 1 by mouth bid  #60 x 0   Entered  and Authorized by:   [name] MD   Signed by:   [name] MD on 02/27/2010   Method used:   Print then Give to Patient   RxID:   [id number]    Orders Added: 1)  Est. Patient Level IV [42595]

## 2010-04-13 NOTE — Progress Notes (Signed)
Summary: REQ FOR RX  Phone Note Call from Patient Call back at [facility] Phone [phone]   Summary of Call: Pt c/o productive cough w/green mucus. Patient is requesting rx for antibiotic.  Initial call taken by: Lamar Sprinkles, CMA,  March 23, 2010 3:48 PM  Follow-up for Phone Call        ok Am[facility] Follow-up by: [name] MD,  March 23, 2010 4:32 PM  Additional Follow-up for Phone Call Additional follow up Details #1::        Pt informed  Additional Follow-up by: Lamar Sprinkles, CMA,  March 23, 2010 5:44 PM    New/Updated Medications: AMOXICILLIN 500 MG CAPS (AMOXICILLIN) 2 caps by mouth bid Prescriptions: AMOXICILLIN 500 MG CAPS (AMOXICILLIN) 2 caps by mouth bid  #40 x 0   Entered and Authorized by:   [name] MD   Signed by:   Lamar Sprinkles, CMA on 03/23/2010   Method used:   Electronically to        [facility]* (retail)       [address]       [place], [place]  [id number]       Ph: [phone]       Fax: [phone]   RxID:   [id number]

## 2010-04-14 NOTE — Medication Information (Signed)
Summary: Reclast enrollment form/[facility]  Reclast enrollment form/[facility]   Imported By: Sherian Rein 07/27/2009 08:34:38  _____________________________________________________________________  External Attachment:    Type:   Image     Comment:   External Document

## 2010-04-14 NOTE — Letter (Signed)
Summary: Reclast inject orders/[facility]  Reclast inject orders/[facility]   Imported By: Sherian Rein 07/27/2009 08:41:38  _____________________________________________________________________  External Attachment:    Type:   Image     Comment:   External Document

## 2010-04-20 ENCOUNTER — Ambulatory Visit: Payer: BC Managed Care – PPO | Admitting: Professional

## 2010-04-24 ENCOUNTER — Ambulatory Visit: Payer: BC Managed Care – PPO | Admitting: Professional

## 2010-04-25 ENCOUNTER — Ambulatory Visit (INDEPENDENT_AMBULATORY_CARE_PROVIDER_SITE_OTHER): Payer: BC Managed Care – PPO | Admitting: Internal Medicine

## 2010-04-25 ENCOUNTER — Ambulatory Visit: Payer: Self-pay | Admitting: Internal Medicine

## 2010-04-25 ENCOUNTER — Encounter: Payer: Self-pay | Admitting: Internal Medicine

## 2010-04-25 DIAGNOSIS — R51 Headache: Secondary | ICD-10-CM

## 2010-04-25 DIAGNOSIS — F329 Major depressive disorder, single episode, unspecified: Secondary | ICD-10-CM

## 2010-04-25 DIAGNOSIS — E119 Type 2 diabetes mellitus without complications: Secondary | ICD-10-CM

## 2010-04-25 DIAGNOSIS — IMO0001 Reserved for inherently not codable concepts without codable children: Secondary | ICD-10-CM

## 2010-04-25 DIAGNOSIS — I10 Essential (primary) hypertension: Secondary | ICD-10-CM

## 2010-04-25 LAB — CONVERTED CEMR LAB: Blood Glucose, Fingerstick: 311

## 2010-05-09 NOTE — Assessment & Plan Note (Signed)
Summary: 1 mo fu/nws  #   Vital Signs:  Patient profile:   65 year old female Height:      62.5 inches Weight:      125 pounds BMI:     22.58 Temp:     97.7 degrees F oral Pulse rate:   88 / minute Pulse rhythm:   regular Resp:     16 per minute BP sitting:   120 / 80  (left arm) Cuff size:   regular  Vitals Entered By: Lanier Prude, CMA(AAMA) (April 25, 2010 11:48 AM) CC: f/u  Is Patient Diabetic? Yes CBG Result 311   Primary Care Provider:  [name] MD  CC:  f/u .  History of Present Illness: The patient presents for a follow up of hypertension, diabetes, hyperlipidemia, FMS, migraines - not doing so well. She is stressed with [name] who was in the hospital x 7 days and he was placed at [facility]. CBG was 400 this am. She had Ensure later.  Current Medications (verified): 1)  Oxycontin 20 Mg Xr12h-Tab (Oxycodone Hcl) .[name].. 1 By Mouth Bid 2)  Oxycodone Hcl 15 Mg  Tabs (Oxycodone Hcl) .[name].. 1 Bid or Tid As Needed For  Pain. 3)  Tamoxifen Citrate 20 Mg  Tabs (Tamoxifen Citrate) .[name].. 1 Bid 4)  Atenolol 100 Mg Tabs (Atenolol) .[name].. 1  Po Qam 5)  Prilosec Otc 20 Mg Tbec (Omeprazole Magnesium) .... 2 Daily 6)  Amitriptyline Hcl 50 Mg Tabs (Amitriptyline Hcl) .... 1/2 or 1 Tab By Mouth Q Hs 7)  Lorazepam 2 Mg Tabs (Lorazepam) .... Three Times A Day Prn 8)  Cortef 10 Mg  Tabs (Hydrocortisone) .[name].. 1 Two Times A Day Am and Lunch 9)  Zomig 5 Mg  Tabs (Zolmitriptan) .[name].. 1 Once Daily As Needed Migraine 10)  Nasonex 50 Mcg/act Susp (Mometasone Furoate) .[name].. 1-2 Each Nostril Qd 11)  Rocaltrol 0.5 Mcg Caps (Calcitriol) .[name].. 1 By Mouth Bid 12)  Topamax 100 Mg  Tabs (Topiramate) .[name].. 1 By Mouth Bid 13)  Voltaren 1 %  Gel (Diclofenac Sodium) .... Two Times A Day  To Qid As Needed 14)  Ranitidine Hcl 300 Mg Tabs (Ranitidine Hcl) .[name].. 1 Po Qd 15)  Triamcinolone Acetonide 0.1 % Oint (Triamcinolone Acetonide) .... Use Two Times A Day Prn45 16)  Accu-Chek Aviva  Strp  (Glucose Blood) .... Use Two Times A Day or More 17)  Apidra Solostar 100 Unit/ml Soln (Insulin Glulisine) .... 4 To 6 Units Daily As Directed 18)  Levemir Flexpen 100 Unit/ml  Soln (Insulin Detemir) .[name].. 18 U in Am and 14-16 U in Pm 19)  Amlodipine Besy-Benazepril Hcl 5-10 Mg Caps (Amlodipine Besy-Benazepril Hcl) .[name].. 1 By Mouth Qd 20)  Bd Pen Needle Short U/f 31g X 8 Mm Misc (Insulin Pen Needle) .... As Dirr 21)  Freestyle Lancets  Misc (Lancets) .... As Dirr 22)  Accu-Chek Soft Touch Lancets  Misc (Lancets) .... As Dirr 23)  Promethazine Hcl 25 Mg Tabs (Promethazine Hcl) .[name].. 1-2 By Mouth Four Times A Day As Needed Nausea 24)  Protonix 40 Mg Tbec (Pantoprazole Sodium) .[name].. 1 By Mouth Qam For Indigestion 25)  Relpax 40 Mg Tabs (Eletriptan Hydrobromide) .[name].. 1 By Mouth Once Daily As Needed Migraine 26)  Topamax 200 Mg Tabs (Topiramate) .[name].. 1 By Mouth Two Times A Day For Migraine Prevention 27)  Prochlorperazine Maleate 10 Mg Tabs (Prochlorperazine Maleate) .[name].. 1 By Mouth Two Times A Day 28)  Citalopram Hydrobromide 40 Mg  Tabs (Citalopram Hydrobromide) .[name].. 1 By Mouth Qd 29)  Accu-Chek Aviva  Strp (Glucose Blood) .... Check Qid  Allergies (verified): 1)  ! Sulfa 2)  ! Avelox (Moxifloxacin Hcl) 3)  ! Effexor 4)  Savella (Milnacipran Hcl) 5)  Duragesic-50 (Fentanyl) 6)  Maxalt 7)  Viibryd (Vilazodone Hcl)  Past History:  Past Medical History: Last updated: 12/23/2009 Depression Diabetes mellitus, type I Hypertension Chronic pain FMS Migraines Breast cancer, hx of - right chronic pancreatitis Anxiety adrenal insufficiency Vit D deficiency GERD L 3d finger dislocated 2009 COPD Anemia-NOS Sternum fracture 11/2008  Osteoporosis (Reclast too expensive $900) Fibromyalgia Hx of pancreatitis Barretts Espohagus 2005 Chronic fatigue Osteoporosis  Social History: Last updated: 08/29/2009 Did not go back to work Oct 23, [name] is very ill - unable to have financial ends meet,  no income 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  The patient denies fever, chest pain, dyspnea on exertion, and prolonged cough.         high CBGs  Physical Exam  General:  NAD Nose:  External nasal examination shows no deformity or inflammation. Nasal mucosa are pink and moist without lesions or exudates. Mouth:  Oral mucosa and oropharynx without lesions or exudates.  Teeth in good repair. Neck:  No deformities, masses, or tenderness noted. Lungs:  clear bilaterally without rhonchi, wheezes or crackles  Heart:  RRR, no rubs, gallops or murmurs Abdomen:  S/NT Msk:  Cervical spine is tender to palpation over paraspinal muscles and with the ROM  Extremities:  no edema, no erythema  Feet are examined  Neurologic:  alert & oriented X3.   Skin:  no rash B feet ok w/o ulcers R inner heel w/a 1.5 cm bluish NT area  Cervical Nodes:  No lymphadenopathy noted Psych:  Not  tearful, not anxious with less  depressed affect.  not suicidal and not homicidal.     Impression & Recommendations:  Problem # 1:  DIABETES MELLITUS, TYPE I (ICD-250.01) Assessment Deteriorated Risks of noncompliance with treatment discussed. Compliance encouraged. She will give herself 10 u Levemir Her updated medication list for this problem includes:    Apidra Solostar 100 Unit/ml Soln (Insulin glulisine) .Marland KitchenMarland KitchenMarland KitchenMarland Kitchen 4 to 6 units daily as directed    Levemir Flexpen 100 Unit/ml Soln (Insulin detemir) .Marland KitchenMarland KitchenMarland KitchenMarland Kitchen 18 u in am and 14-16 u in pm    Amlodipine Besy-benazepril Hcl 5-10 Mg Caps (Amlodipine besy-benazepril hcl) .[name]... 1 by mouth qd  Problem # 2:  FATIGUE (ICD-780.79) Assessment: Unchanged  Problem # 3:  HEADACHE (ICD-784.0) Assessment: Unchanged  Her updated medication list for this problem includes:    Oxycontin 20 Mg Xr12h-tab (Oxycodone hcl) .[name]... 1 by mouth  bid    Oxycodone Hcl 15 Mg Tabs (Oxycodone hcl) .[name]... 1 bid or tid as needed for  pain.    Atenolol 100 Mg Tabs (Atenolol) .[name]... 1  po qam    Zomig 5 Mg Tabs (Zolmitriptan) .[name]... 1 once daily as needed migraine    Relpax 40 Mg Tabs (Eletriptan hydrobromide) .[name]... 1 by mouth once daily as needed migraine    Sprix 15.75 Mg/spray Soln (Ketorolac tromethamine) .[name]... 1 spr in each nostril three times a day as needed FOR SPORADIC USE ONLY  Problem # 4:  COPD (ICD-496) Assessment: Unchanged  Problem # 5:  DEPRESSION (ICD-311) Assessment: Unchanged  Her updated medication list  for this problem includes:    Amitriptyline Hcl 50 Mg Tabs (Amitriptyline hcl) .[name]... 1/2 or 1 tab by mouth q hs    Lorazepam 2 Mg Tabs (Lorazepam) .[name]... Three times a day prn    Citalopram Hydrobromide 40 Mg Tabs (Citalopram hydrobromide) .[name]... 1 by mouth qd  Problem # 6:  TOBACCO USE DISORDER/SMOKER-SMOKING CESSATION DISCUSSED (ICD-305.1) Assessment: Unchanged  Encouraged smoking cessation and discussed different methods for smoking cessation.   Problem # 7:  MENTAL CONFUSION (ICD-298.9) resolved Assessment: Improved  Problem # 8:  FIBROMYALGIA (ICD-729.1) Assessment: Unchanged  Her updated medication list for this problem includes:    Oxycontin 20 Mg Xr12h-tab (Oxycodone hcl) .[name]... 1 by mouth bid    Oxycodone Hcl 15 Mg Tabs (Oxycodone hcl) .[name]... 1 bid or tid as needed for  pain.    Sprix 15.75 Mg/spray Soln (Ketorolac tromethamine) .[name]... 1 spr in each nostril three times a day prn  Complete Medication List: 1)  Oxycontin 20 Mg Xr12h-tab (Oxycodone hcl) .[name].. 1 by mouth bid 2)  Oxycodone Hcl 15 Mg Tabs (Oxycodone hcl) .[name].. 1 bid or tid as needed for  pain. 3)  Tamoxifen Citrate 20 Mg Tabs (Tamoxifen citrate) .[name].. 1 bid 4)  Atenolol 100 Mg Tabs (Atenolol) .[name].. 1  po qam 5)  Prilosec Otc 20 Mg Tbec (Omeprazole magnesium) .... 2 daily 6)  Amitriptyline Hcl 50 Mg Tabs (Amitriptyline hcl) .... 1/2 or 1 tab by mouth q  hs 7)  Lorazepam 2 Mg Tabs (Lorazepam) .... Three times a day prn 8)  Cortef 10 Mg Tabs (Hydrocortisone) .[name].. 1 two times a day am and lunch 9)  Zomig 5 Mg Tabs (Zolmitriptan) .[name].. 1 once daily as needed migraine 10)  Nasonex 50 Mcg/act Susp (Mometasone furoate) .[name].. 1-2 each nostril qd 11)  Rocaltrol 0.5 Mcg Caps (Calcitriol) .[name].. 1 by mouth bid 12)  Topamax 100 Mg Tabs (Topiramate) .[name].. 1 by mouth bid 13)  Voltaren 1 % Gel (Diclofenac sodium) .... Two times a day  to qid as needed 14)  Ranitidine Hcl 300 Mg Tabs (Ranitidine hcl) .[name].. 1 po qd 15)  Triamcinolone Acetonide 0.1 % Oint (Triamcinolone acetonide) .... Use two times a day prn45 16)  Accu-chek Aviva Strp (Glucose blood) .... Use two times a day or more 17)  Apidra Solostar 100 Unit/ml Soln (Insulin glulisine) .... 4 to 6 units daily as directed 18)  Levemir Flexpen 100 Unit/ml Soln (Insulin detemir) .[name].. 18 u in am and 14-16 u in pm 19)  Amlodipine Besy-benazepril Hcl 5-10 Mg Caps (Amlodipine besy-benazepril hcl) .[name].. 1 by mouth qd 20)  Bd Pen Needle Short U/f 31g X 8 Mm Misc (Insulin pen needle) .... As dirr 21)  Freestyle Lancets Misc (Lancets) .... As dirr 22)  Accu-chek Soft Touch Lancets Misc (Lancets) .... As dirr 23)  Promethazine Hcl 25 Mg Tabs (Promethazine hcl) .[name].. 1-2 by mouth four times a day as needed nausea 24)  Protonix 40 Mg Tbec (Pantoprazole sodium) .[name].. 1 by mouth qam for indigestion 25)  Relpax 40 Mg Tabs (Eletriptan hydrobromide) .[name].. 1 by mouth once daily as needed migraine 26)  Topamax 200 Mg Tabs (Topiramate) .[name].. 1 by mouth two times a day for migraine prevention 27)  Prochlorperazine Maleate 10 Mg Tabs (Prochlorperazine maleate) .[name].. 1 by mouth two times a day 28)  Citalopram Hydrobromide 40 Mg Tabs (Citalopram hydrobromide) .[name].. 1 by mouth qd 29)  Accu-chek Aviva Strp (Glucose blood) .... Check qid 30)  Sprix 15.75 Mg/spray Soln (  Ketorolac tromethamine) .[name].. 1 spr in each nostril three times a day  prn  Other Orders: Capillary Blood Glucose/CBG (16109)  Patient Instructions: 1)  Please schedule a follow-up appointment in 1 month. Prescriptions: OXYCODONE HCL 15 MG  TABS (OXYCODONE HCL) 1 bid or tid as needed for  pain.  #90 x 0   Entered and Authorized by:   [name] MD   Signed by:   [name] MD on 04/25/2010   Method used:   Print then Give to Patient   RxID:   [id number] OXYCONTIN 20 MG XR12H-TAB (OXYCODONE HCL) 1 by mouth bid  #60 x 0   Entered and Authorized by:   [name] MD   Signed by:   [name] MD on 04/25/2010   Method used:   Print then Give to Patient   RxID:   [id number] SPRIX 15.75 MG/SPRAY SOLN (KETOROLAC TROMETHAMINE) 1 spr in each nostril three times a day prn  #1 box x 3   Entered and Authorized by:   [name] MD   Signed by:   [name] MD on 04/25/2010   Method used:   Print then Give to Patient   RxID:   [id number]    Orders Added: 1)  Capillary Blood Glucose/CBG [25366] 2)  Est. Patient Level IV [44034]

## 2010-05-19 ENCOUNTER — Ambulatory Visit (INDEPENDENT_AMBULATORY_CARE_PROVIDER_SITE_OTHER): Payer: BC Managed Care – PPO | Admitting: Internal Medicine

## 2010-05-19 ENCOUNTER — Encounter: Payer: Self-pay | Admitting: Internal Medicine

## 2010-05-19 DIAGNOSIS — IMO0001 Reserved for inherently not codable concepts without codable children: Secondary | ICD-10-CM

## 2010-05-19 DIAGNOSIS — L680 Hirsutism: Secondary | ICD-10-CM | POA: Insufficient documentation

## 2010-05-19 DIAGNOSIS — I10 Essential (primary) hypertension: Secondary | ICD-10-CM

## 2010-05-19 DIAGNOSIS — G43819 Other migraine, intractable, without status migrainosus: Secondary | ICD-10-CM

## 2010-05-19 DIAGNOSIS — F329 Major depressive disorder, single episode, unspecified: Secondary | ICD-10-CM

## 2010-05-22 ENCOUNTER — Telehealth: Payer: Self-pay | Admitting: Internal Medicine

## 2010-05-30 NOTE — Assessment & Plan Note (Signed)
Summary: 1 mos f/u   Vital Signs:  Patient profile:   65 year old female Height:      62.5 inches Weight:      126 pounds BMI:     22.76 Temp:     98.9 degrees F oral Pulse rate:   76 / minute Pulse rhythm:   regular Resp:     16 per minute BP sitting:   110 / 70  (left arm) Cuff size:   regular  Vitals Entered By: Lanier Prude, Beverly Gust) (May 19, 2010 2:38 PM) CC: 1 mo f/u  Is Patient Diabetic? Yes   Primary Care Provider:  [name] MD  CC:  1 mo f/u .  History of Present Illness: The patient presents for a follow up of back pain, anxiety, depression and headaches. She started to take less Xanax at hs. C/o LBP - strained it moving [place]. She ran out of Oxycodone the other day. C/o hair growth on face.  Current Medications (verified): 1)  Oxycontin 20 Mg Xr12h-Tab (Oxycodone Hcl) .[name].. 1 By Mouth Bid 2)  Oxycodone Hcl 15 Mg  Tabs (Oxycodone Hcl) .[name].. 1 Bid or Tid As Needed For  Pain. 3)  Tamoxifen Citrate 20 Mg  Tabs (Tamoxifen Citrate) .[name].. 1 Bid 4)  Atenolol 100 Mg Tabs (Atenolol) .[name].. 1  Po Qam 5)  Prilosec Otc 20 Mg Tbec (Omeprazole Magnesium) .... 2 Daily 6)  Amitriptyline Hcl 50 Mg Tabs (Amitriptyline Hcl) .... 1/2 or 1 Tab By Mouth Q Hs 7)  Lorazepam 2 Mg Tabs (Lorazepam) .... Three Times A Day Prn 8)  Cortef 10 Mg  Tabs (Hydrocortisone) .[name].. 1 Two Times A Day Am and Lunch 9)  Zomig 5 Mg  Tabs (Zolmitriptan) .[name].. 1 Once Daily As Needed Migraine 10)  Nasonex 50 Mcg/act Susp (Mometasone Furoate) .[name].. 1-2 Each Nostril Qd 11)  Rocaltrol 0.5 Mcg Caps (Calcitriol) .[name].. 1 By Mouth Bid 12)  Topamax 100 Mg  Tabs (Topiramate) .[name].. 1 By Mouth Bid 13)  Voltaren 1 %  Gel (Diclofenac Sodium) .... Two Times A Day  To Qid As Needed 14)  Ranitidine Hcl 300 Mg Tabs (Ranitidine Hcl) .[name].. 1 Po Qd 15)  Triamcinolone Acetonide 0.1 % Oint (Triamcinolone Acetonide) .... Use Two Times A Day Prn45 16)  Accu-Chek Aviva  Strp (Glucose Blood) .... Use Two Times A Day or More 17)   Apidra Solostar 100 Unit/ml Soln (Insulin Glulisine) .... 4 To 6 Units Daily As Directed 18)  Levemir Flexpen 100 Unit/ml  Soln (Insulin Detemir) .[name].. 18 U in Am and 14-16 U in Pm 19)  Amlodipine Besy-Benazepril Hcl 5-10 Mg Caps (Amlodipine Besy-Benazepril Hcl) .[name].. 1 By Mouth Qd 20)  Bd Pen Needle Short U/f 31g X 8 Mm Misc (Insulin Pen Needle) .... As Dirr 21)  Freestyle Lancets  Misc (Lancets) .... As Dirr 22)  Accu-Chek Soft Touch Lancets  Misc (Lancets) .... As Dirr 23)  Promethazine Hcl 25 Mg Tabs (Promethazine Hcl) .[name].. 1-2 By Mouth Four Times A Day As Needed Nausea 24)  Protonix 40 Mg Tbec (Pantoprazole Sodium) .[name].. 1 By Mouth Qam For Indigestion 25)  Relpax 40 Mg Tabs (Eletriptan Hydrobromide) .[name].. 1 By Mouth Once Daily As Needed Migraine 26)  Topamax 200 Mg Tabs (Topiramate) .[name].. 1 By Mouth Two Times A Day For Migraine Prevention 27)  Prochlorperazine Maleate 10 Mg Tabs (Prochlorperazine Maleate) .[name].. 1 By Mouth Two Times A Day 28)  Citalopram Hydrobromide 40 Mg Tabs (Citalopram Hydrobromide) .Marland KitchenMarland KitchenMarland Kitchen 1  By Mouth Qd 29)  Accu-Chek Aviva  Strp (Glucose Blood) .... Check Qid 30)  Sprix 15.75 Mg/spray Soln (Ketorolac Tromethamine) .[name].. 1 Spr in Each Nostril Three Times A Day Prn  Allergies (verified): 1)  ! Sulfa 2)  ! Avelox (Moxifloxacin Hcl) 3)  ! Effexor 4)  Savella (Milnacipran Hcl) 5)  Duragesic-50 (Fentanyl) 6)  Maxalt 7)  Viibryd (Vilazodone Hcl)  Past History:  Past Medical History: Last updated: 12/23/2009 Depression Diabetes mellitus, type I Hypertension Chronic pain FMS Migraines Breast cancer, hx of - right chronic pancreatitis Anxiety adrenal insufficiency Vit D deficiency GERD L 3d finger dislocated 2009 COPD Anemia-NOS Sternum fracture 11/2008  Osteoporosis (Reclast too expensive $900) Fibromyalgia Hx of pancreatitis Barretts Espohagus 2005 Chronic fatigue Osteoporosis  Social History: Did not go back to work Oct 23, [name] is very ill - unable to  have financial ends meet, no income 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  The patient denies chest pain, abdominal pain, and hematochezia.    Physical Exam  General:  NAD Nose:  External nasal examination shows no deformity or inflammation. Nasal mucosa are pink and moist without lesions or exudates. Mouth:  Oral mucosa and oropharynx without lesions or exudates.  Teeth in good repair. Neck:  No deformities, masses, or tenderness noted. Lungs:  clear bilaterally without rhonchi, wheezes or crackles  Heart:  RRR, no rubs, gallops or murmurs Abdomen:  S/NT Msk:  Cervical spine is tender to palpation over paraspinal muscles and with the ROM  Extremities:  no edema, no erythema  Feet are examined  Neurologic:  alert & oriented X3.   Skin:  no rash B feet ok w/o ulcers R inner heel w/a 1.5 cm bluish NT area upper lip - exessive hair  Psych:  Not  tearful, not anxious with less  depressed affect.  not suicidal and not homicidal.     Impression & Recommendations:  Problem # 1:  FATIGUE (ICD-780.79) Assessment Improved  Problem # 2:  SOMNOLENCE (ICD-780.09) Assessment: Improved  Problem # 3:  DIABETES MELLITUS, TYPE I (ICD-250.01) Assessment: Unchanged  Her updated medication list for this problem includes:    Apidra Solostar 100 Unit/ml Soln (Insulin glulisine) .Marland KitchenMarland KitchenMarland KitchenMarland Kitchen 4 to 6 units daily as directed    Levemir Flexpen 100 Unit/ml Soln (Insulin detemir) .Marland KitchenMarland KitchenMarland KitchenMarland Kitchen 18 u in am and 14-16 u in pm    Amlodipine Besy-benazepril Hcl 5-10 Mg Caps (Amlodipine besy-benazepril hcl) .[name]... 1 by mouth qd  Problem # 4:  FIBROMYALGIA (ICD-729.1) Assessment: Unchanged  Her updated medication list for this problem includes:    Oxycontin 20 Mg Xr12h-tab (Oxycodone hcl) .[name]... 1 by mouth two times a day. We will be cutting back on  it next month    Oxycodone Hcl 15 Mg Tabs (Oxycodone hcl) .[name]... 1 bid or tid as needed for  pain.    Sprix 15.75 Mg/spray Soln (Ketorolac tromethamine) .[name]... 1 spr in each nostril three times a day prn  Problem # 5:  ANXIETY (ICD-300.00) Assessment: Unchanged  Her updated medication list for this problem includes:    Amitriptyline Hcl 50 Mg Tabs (Amitriptyline hcl) .[name]... 1/2 or 1 tab by mouth q hs    Lorazepam 2 Mg Tabs (Lorazepam) .[name]... Three times a day prn    Citalopram Hydrobromide 40 Mg Tabs (Citalopram hydrobromide) .Marland KitchenMarland KitchenMarland KitchenMarland Kitchen 1  by mouth qd  Problem # 6:  COMMON MIGRAINE (ICD-346.10) Assessment: Improved  Her updated medication list for this problem includes:    Oxycontin 20 Mg Xr12h-tab (Oxycodone hcl) .[name]... 1 by mouth bid    Oxycodone Hcl 15 Mg Tabs (Oxycodone hcl) .[name]... 1 bid or tid as needed for  pain.    Atenolol 100 Mg Tabs (Atenolol) .[name]... 1  po qam    Zomig 5 Mg Tabs (Zolmitriptan) .[name]... 1 once daily as needed migraine    Relpax 40 Mg Tabs (Eletriptan hydrobromide) .[name]... 1 by mouth once daily as needed migraine    Sprix 15.75 Mg/spray Soln (Ketorolac tromethamine) .[name]... 1 spr in each nostril three times a day prn  Problem # 7:  HYPERTRICHOSIS (ICD-704.1) Assessment: Deteriorated  Vaniqua two times a day   Complete Medication List: 1)  Oxycontin 20 Mg Xr12h-tab (Oxycodone hcl) .[name].. 1 by mouth bid 2)  Oxycodone Hcl 15 Mg Tabs (Oxycodone hcl) .[name].. 1 bid or tid as needed for  pain. 3)  Tamoxifen Citrate 20 Mg Tabs (Tamoxifen citrate) .[name].. 1 bid 4)  Atenolol 100 Mg Tabs (Atenolol) .[name].. 1  po qam 5)  Amitriptyline Hcl 50 Mg Tabs (Amitriptyline hcl) .... 1/2 or 1 tab by mouth q hs 6)  Lorazepam 2 Mg Tabs (Lorazepam) .... Three times a day prn 7)  Cortef 10 Mg Tabs (Hydrocortisone) .[name].. 1 two times a day am and lunch 8)  Zomig 5 Mg Tabs (Zolmitriptan) .[name].. 1 once daily as needed migraine 9)  Nasonex 50 Mcg/act Susp (Mometasone furoate) .[name].. 1-2 each nostril qd 10)  Rocaltrol  0.5 Mcg Caps (Calcitriol) .[name].. 1 by mouth bid 11)  Topamax 100 Mg Tabs (Topiramate) .[name].. 1 by mouth bid 12)  Voltaren 1 % Gel (Diclofenac sodium) .... Two times a day  to qid as needed 13)  Ranitidine Hcl 300 Mg Tabs (Ranitidine hcl) .[name].. 1 po qd 14)  Triamcinolone Acetonide 0.1 % Oint (Triamcinolone acetonide) .... Use two times a day prn45 15)  Accu-chek Aviva Strp (Glucose blood) .... Use two times a day or more 16)  Apidra Solostar 100 Unit/ml Soln (Insulin glulisine) .... 4 to 6 units daily as directed 17)  Levemir Flexpen 100 Unit/ml Soln (Insulin detemir) .[name].. 18 u in am and 14-16 u in pm 18)  Amlodipine Besy-benazepril Hcl 5-10 Mg Caps (Amlodipine besy-benazepril hcl) .[name].. 1 by mouth qd 19)  Bd Pen Needle Short U/f 31g X 8 Mm Misc (Insulin pen needle) .... As dirr 20)  Freestyle Lancets Misc (Lancets) .... As dirr 21)  Accu-chek Soft Touch Lancets Misc (Lancets) .... As dirr 22)  Promethazine Hcl 25 Mg Tabs (Promethazine hcl) .[name].. 1-2 by mouth four times a day as needed nausea 23)  Relpax 40 Mg Tabs (Eletriptan hydrobromide) .[name].. 1 by mouth once daily as needed migraine 24)  Topamax 200 Mg Tabs (Topiramate) .[name].. 1 by mouth two times a day for migraine prevention 25)  Prochlorperazine Maleate 10 Mg Tabs (Prochlorperazine maleate) .[name].. 1 by mouth two times a day 26)  Citalopram Hydrobromide 40 Mg Tabs (Citalopram hydrobromide) .[name].. 1 by mouth qd 27)  Accu-chek Aviva Strp (Glucose blood) .... Check qid 28)  Sprix 15.75 Mg/spray Soln (Ketorolac tromethamine) .[name].. 1 spr in each nostril three times a day prn 29)  Omeprazole 40 Mg Cpdr (Omeprazole) .[name].. 1 by mouth qam for indigestion 30)  Vaniqa 13.9 % Crea (Eflornithine hcl) .... Use two times a day on face as dirrected  Patient Instructions: 1)  Oxycodone 15 mg - take 1/2 two times a day x 3 d, then 1/2 a day x 3 d, then stop or take as needed  2)  Please schedule a follow-up appointment in 1 month. 3)  BMP prior to visit, ICD-9: 4)   Hepatic Panel prior to visit, ICD-9: 5)  Lipid Panel prior to visit, ICD-9: 6)  HbgA1C prior to visit, ICD-9:250.01 Prescriptions: VANIQA 13.9 % CREA (EFLORNITHINE HCL) use two times a day on face as dirrected  #30 g x 2   Entered and Authorized by:   [name] MD   Signed by:   [name] MD on 05/22/2010   Method used:   Electronically to        [facility]* (retail)       [address]       [place], [place]  [id number]       Ph: [phone]       Fax: [phone]   RxID:   [id number] OXYCONTIN 20 MG XR12H-TAB (OXYCODONE HCL) 1 by mouth bid  #60 x 0   Entered and Authorized by:   [name] MD   Signed by:   [name] MD on 05/22/2010   Method used:   Print then Give to Patient   RxID:   [id number] ZOMIG 5 MG  TABS (ZOLMITRIPTAN) 1 once daily as needed migraine  #8 Each x 5   Entered and Authorized by:   [name] MD   Signed by:   [name] MD on 05/19/2010   Method used:   Print then Give to Patient   RxID:   [id number] OMEPRAZOLE 40 MG CPDR (OMEPRAZOLE) 1 by mouth qam for indigestion  #90 x 3   Entered and Authorized by:   [name] MD   Signed by:   [name] MD on 05/19/2010   Method used:   Print then Give to Patient   RxID:   [id number] PROMETHAZINE HCL 25 MG TABS (PROMETHAZINE HCL) 1-2 by mouth four times a day as needed nausea  #60 x 3   Entered and Authorized by:   [name] MD   Signed by:   [name] MD on 05/19/2010   Method used:   Print then Give to Patient   RxID:   [id number] OXYCODONE HCL 15 MG  TABS (OXYCODONE HCL) 1 bid or tid as needed for  pain.  #90 x 0   Entered and Authorized by:   [name] MD   Signed by:   [name] MD on 05/19/2010   Method used:   Print then Give to Patient   RxID:   [id number]    Orders Added: 1)  Est. Patient Level IV [76160]

## 2010-05-30 NOTE — Progress Notes (Signed)
  Phone Note Other Incoming   Summary of Call: Cleo needs ref on Oxycont Initial call taken by: [name] MD,  May 22, 2010 5:27 PM  Follow-up for Phone Call        ok Follow-up by: [name] MD,  May 22, 2010 5:27 PM

## 2010-06-09 ENCOUNTER — Telehealth: Payer: Self-pay | Admitting: *Deleted

## 2010-06-09 NOTE — Telephone Encounter (Signed)
rec pt's benefit summary from [employer]... Her OOP is $0....and PA has been approve per ins.  I called pt to inform of this...she is not home.....Marland KitchenMarland KitchenLeft mess for patient to call back.

## 2010-06-15 ENCOUNTER — Other Ambulatory Visit (INDEPENDENT_AMBULATORY_CARE_PROVIDER_SITE_OTHER): Payer: BC Managed Care – PPO

## 2010-06-15 ENCOUNTER — Telehealth: Payer: Self-pay | Admitting: Internal Medicine

## 2010-06-15 DIAGNOSIS — E7800 Pure hypercholesterolemia, unspecified: Secondary | ICD-10-CM

## 2010-06-15 DIAGNOSIS — E109 Type 1 diabetes mellitus without complications: Secondary | ICD-10-CM

## 2010-06-15 LAB — LIPID PANEL
Cholesterol: 226 mg/dL — ABNORMAL HIGH (ref 0–200)
HDL: 62.6 mg/dL (ref >39.00)
Total CHOL/HDL Ratio: 4
Triglycerides: 213 mg/dL — ABNORMAL HIGH (ref 0.0–149.0)
VLDL: 42.6 mg/dL — ABNORMAL HIGH (ref 0.0–40.0)

## 2010-06-15 LAB — BASIC METABOLIC PANEL
Calcium: 9.9 mg/dL (ref 8.4–10.5)
Creatinine, Ser: 1.1 mg/dL (ref 0.4–1.2)
GFR: 56.24 mL/min — ABNORMAL LOW (ref >60.00)
Sodium: 145 mEq/L (ref 135–145)

## 2010-06-15 LAB — HEMOGLOBIN A1C: Hgb A1c MFr Bld: 8.6 % — ABNORMAL HIGH (ref 4.6–6.5)

## 2010-06-15 LAB — LDL CHOLESTEROL, DIRECT: Direct LDL: 131.9 mg/dL

## 2010-06-15 LAB — HEPATIC FUNCTION PANEL
ALT: 17 U/L (ref 0–35)
Bilirubin, Direct: 0.1 mg/dL (ref 0.0–0.3)
Total Bilirubin: 0.6 mg/dL (ref 0.3–1.2)

## 2010-06-15 NOTE — Telephone Encounter (Signed)
I called pt to inform of below.....she wants to come on 06-22-10

## 2010-06-15 NOTE — Telephone Encounter (Deleted)
Keep ROV 

## 2010-06-19 ENCOUNTER — Ambulatory Visit (INDEPENDENT_AMBULATORY_CARE_PROVIDER_SITE_OTHER): Payer: BC Managed Care – PPO | Admitting: Internal Medicine

## 2010-06-19 ENCOUNTER — Encounter: Payer: Self-pay | Admitting: Internal Medicine

## 2010-06-19 DIAGNOSIS — K219 Gastro-esophageal reflux disease without esophagitis: Secondary | ICD-10-CM

## 2010-06-19 DIAGNOSIS — F29 Unspecified psychosis not due to a substance or known physiological condition: Secondary | ICD-10-CM

## 2010-06-19 DIAGNOSIS — IMO0001 Reserved for inherently not codable concepts without codable children: Secondary | ICD-10-CM

## 2010-06-19 DIAGNOSIS — G43009 Migraine without aura, not intractable, without status migrainosus: Secondary | ICD-10-CM

## 2010-06-19 DIAGNOSIS — E109 Type 1 diabetes mellitus without complications: Secondary | ICD-10-CM

## 2010-06-19 MED ORDER — ATENOLOL 100 MG PO TABS: 100.0000 mg | ORAL_TABLET | ORAL | Status: DC

## 2010-06-19 MED ORDER — PROCHLORPERAZINE MALEATE 10 MG PO TABS: 10.0000 mg | ORAL_TABLET | Freq: Four times a day (QID) | ORAL | Status: DC | PRN

## 2010-06-19 MED ORDER — OXYCODONE HCL 20 MG PO TB12: 20.0000 mg | ORAL_TABLET | Freq: Two times a day (BID) | ORAL | Status: DC

## 2010-06-19 MED ORDER — OXYCODONE HCL 15 MG PO TABS: 15.0000 mg | ORAL_TABLET | ORAL | Status: DC

## 2010-06-19 MED ORDER — ATORVASTATIN CALCIUM 10 MG PO TABS: 10.0000 mg | ORAL_TABLET | Freq: Every day | ORAL | Status: DC

## 2010-06-19 NOTE — Assessment & Plan Note (Signed)
Cont Rx 

## 2010-06-19 NOTE — Progress Notes (Signed)
Subjective:    Patient ID: Lindsey Gomez, female    DOB: April 01, 1945, 65 y.o.   MRN: [id number]  HPI   The patient is here to follow up on chronic depression, anxiety, migraine headaches and chronic moderate fibromyalgia symptoms not well controlled with medicines. [name] DM - she had a low CBG episode ---CBG=40 once.   Wt Readings from Last 3 Encounters:  06/19/10 127 lb (57.607 kg)  05/19/10 126 lb (57.153 kg)  04/25/10 125 lb (56.7 kg)    Review of Systems  Constitutional: Positive for fatigue. Negative for activity change.  HENT: Positive for sinus pressure. Negative for ear pain, sneezing and mouth sores.   Eyes: Negative for pain.  Respiratory: Negative for chest tightness and stridor.   Cardiovascular: Negative for chest pain, palpitations and leg swelling.  Gastrointestinal: Positive for vomiting (with HAs) and diarrhea. Negative for blood in stool and abdominal distention.  Genitourinary: Positive for frequency. Negative for difficulty urinating.  Musculoskeletal: Positive for back pain. Negative for joint swelling and gait problem.  Skin: Negative for rash.  Neurological: Negative for dizziness, tremors, seizures, syncope, weakness and numbness.  Hematological: Negative for adenopathy. Does not bruise/bleed easily.  Psychiatric/Behavioral: Positive for dysphoric mood and decreased concentration. Negative for suicidal ideas, behavioral problems and confusion.       Objective:   Physical Exam  Constitutional: She appears well-developed and well-nourished. No distress.  HENT:  Head: Normocephalic.  Right Ear: External ear normal.  Left Ear: External ear normal.  Nose: Nose normal.  Mouth/Throat: Oropharynx is clear and moist.  Eyes: Conjunctivae are normal. Pupils are equal, round, and reactive to light. Right eye exhibits no discharge. Left eye exhibits no discharge.  Neck: Normal range of motion. Neck supple. No JVD present. No tracheal deviation present. No thyromegaly  present.  Cardiovascular: Normal rate, regular rhythm and normal heart sounds.   Pulmonary/Chest: No stridor. No respiratory distress. She has no wheezes.  Abdominal: Soft. Bowel sounds are normal. She exhibits no distension and no mass. There is no tenderness. There is no rebound and no guarding.  Musculoskeletal: She exhibits no edema and no tenderness.  Lymphadenopathy:    She has no cervical adenopathy.  Neurological: She displays normal reflexes. No cranial nerve deficit. She exhibits normal muscle tone. Coordination normal.  Skin: No rash noted. No erythema.  Psychiatric: She has a normal mood and affect. Her behavior is normal. Judgment and thought content normal.        Lab Results  Component Value Date   WBC 5.8 12/21/2009   HGB 11.1* 12/21/2009   HCT 32.3* 12/21/2009   PLT 151.0 12/21/2009   CHOL 226* 06/15/2010   TRIG 213.0* 06/15/2010   HDL 62.60 06/15/2010   LDLDIRECT 131.9 06/15/2010   ALT 17 06/15/2010   AST 20 06/15/2010   NA 145 06/15/2010   K 3.8 06/15/2010   CL 107 06/15/2010   CREATININE 1.1 06/15/2010   BUN 14 06/15/2010   CO2 28 06/15/2010   TSH 3.48 01/18/2009   INR 0.8 RATIO 12/03/2006   HGBA1C 8.6* 06/15/2010   MICROALBUR 0.9 03/22/2008     Assessment & Plan:  DIABETES MELLITUS, TYPE I Use qid ac insulin. Do not increase long acting insulin. Labs reviewed  COMMON MIGRAINE No change in meds  MENTAL CONFUSION Resolved  GERD On Rx  FIBROMYALGIA Cont Rx   Hyperlipidemia    We ill try a statin  Potential benefits of a long term opioids use as well as potential risks (  i.e. addiction risk, apnea etc) and complications (i.e. Somnolence, constipation and others) were explained to the patient and were aknowledged.

## 2010-06-19 NOTE — Assessment & Plan Note (Signed)
Use qid ac insulin. Do not increase long acting insulin. Labs reviewed

## 2010-06-19 NOTE — Assessment & Plan Note (Signed)
No change in meds.

## 2010-06-19 NOTE — Patient Instructions (Signed)
Try to take Oxycontin 1 a day if possible -- twice a da on a bad day only

## 2010-06-19 NOTE — Assessment & Plan Note (Signed)
Resolved

## 2010-06-19 NOTE — Assessment & Plan Note (Signed)
On Rx 

## 2010-06-22 ENCOUNTER — Ambulatory Visit (INDEPENDENT_AMBULATORY_CARE_PROVIDER_SITE_OTHER): Payer: BC Managed Care – PPO | Admitting: *Deleted

## 2010-06-22 DIAGNOSIS — M810 Age-related osteoporosis without current pathological fracture: Secondary | ICD-10-CM

## 2010-06-22 MED ORDER — DENOSUMAB 60 MG/ML ~~LOC~~ SOLN
60.0000 mg | Freq: Once | SUBCUTANEOUS | Status: AC
  Administered 2010-06-22: 60 mg via SUBCUTANEOUS

## 2010-06-28 ENCOUNTER — Encounter (HOSPITAL_BASED_OUTPATIENT_CLINIC_OR_DEPARTMENT_OTHER): Payer: BC Managed Care – PPO | Admitting: Oncology

## 2010-06-28 ENCOUNTER — Other Ambulatory Visit: Payer: Self-pay | Admitting: Oncology

## 2010-06-28 DIAGNOSIS — C50419 Malignant neoplasm of upper-outer quadrant of unspecified female breast: Secondary | ICD-10-CM

## 2010-06-28 LAB — CBC WITH DIFFERENTIAL/PLATELET
EOS%: 2.1 % (ref 0.0–7.0)
Eosinophils Absolute: 0.1 10*3/uL (ref 0.0–0.5)
MCV: 88.7 fL (ref 79.5–101.0)
MONO%: 5.7 % (ref 0.0–14.0)
NEUT#: 3.2 10*3/uL (ref 1.5–6.5)
RBC: 3.43 10*6/uL — ABNORMAL LOW (ref 3.70–5.45)
RDW: 17.5 % — ABNORMAL HIGH (ref 11.2–14.5)
lymph#: 1.3 10*3/uL (ref 0.9–3.3)

## 2010-06-29 LAB — COMPREHENSIVE METABOLIC PANEL
AST: 27 U/L (ref 0–37)
Albumin: 3.6 g/dL (ref 3.5–5.2)
Alkaline Phosphatase: 70 U/L (ref 39–117)
Glucose, Bld: 306 mg/dL — ABNORMAL HIGH (ref 70–99)
Potassium: 4.1 mEq/L (ref 3.5–5.3)
Sodium: 140 mEq/L (ref 135–145)
Total Protein: 5.9 g/dL — ABNORMAL LOW (ref 6.0–8.3)

## 2010-06-29 LAB — CANCER ANTIGEN 27.29: CA 27.29: 21 U/mL (ref 0–39)

## 2010-07-05 ENCOUNTER — Encounter (HOSPITAL_BASED_OUTPATIENT_CLINIC_OR_DEPARTMENT_OTHER): Payer: BC Managed Care – PPO | Admitting: Oncology

## 2010-07-05 ENCOUNTER — Other Ambulatory Visit: Payer: Self-pay | Admitting: Oncology

## 2010-07-05 DIAGNOSIS — Z170 Estrogen receptor positive status [ER+]: Secondary | ICD-10-CM

## 2010-07-05 DIAGNOSIS — Z9889 Other specified postprocedural states: Secondary | ICD-10-CM

## 2010-07-05 DIAGNOSIS — C50419 Malignant neoplasm of upper-outer quadrant of unspecified female breast: Secondary | ICD-10-CM

## 2010-07-05 DIAGNOSIS — F411 Generalized anxiety disorder: Secondary | ICD-10-CM

## 2010-07-18 ENCOUNTER — Encounter: Payer: Self-pay | Admitting: Internal Medicine

## 2010-07-18 ENCOUNTER — Ambulatory Visit (INDEPENDENT_AMBULATORY_CARE_PROVIDER_SITE_OTHER): Payer: BC Managed Care – PPO | Admitting: Internal Medicine

## 2010-07-18 DIAGNOSIS — IMO0001 Reserved for inherently not codable concepts without codable children: Secondary | ICD-10-CM

## 2010-07-18 DIAGNOSIS — E109 Type 1 diabetes mellitus without complications: Secondary | ICD-10-CM

## 2010-07-18 DIAGNOSIS — K219 Gastro-esophageal reflux disease without esophagitis: Secondary | ICD-10-CM

## 2010-07-18 DIAGNOSIS — G43009 Migraine without aura, not intractable, without status migrainosus: Secondary | ICD-10-CM

## 2010-07-18 MED ORDER — PROCHLORPERAZINE MALEATE 10 MG PO TABS: 10.0000 mg | ORAL_TABLET | Freq: Four times a day (QID) | ORAL | Status: DC | PRN

## 2010-07-18 MED ORDER — OXYCODONE HCL 15 MG PO TABS: 15.0000 mg | ORAL_TABLET | ORAL | Status: DC

## 2010-07-18 MED ORDER — INSULIN PEN NEEDLE 31G X 8 MM MISC: 1.0000 | Status: DC

## 2010-07-18 MED ORDER — INSULIN GLULISINE 100 UNIT/ML ~~LOC~~ SOLN: 4.0000 [IU] | Freq: Three times a day (TID) | SUBCUTANEOUS | Status: DC

## 2010-07-18 MED ORDER — PROMETHAZINE HCL 12.5 MG PO TABS: 12.5000 mg | ORAL_TABLET | Freq: Four times a day (QID) | ORAL | Status: AC | PRN

## 2010-07-18 MED ORDER — AMLODIPINE BESY-BENAZEPRIL HCL 5-10 MG PO CAPS: 1.0000 | ORAL_CAPSULE | Freq: Every day | ORAL | Status: DC

## 2010-07-18 MED ORDER — INSULIN DETEMIR 100 UNIT/ML ~~LOC~~ SOLN: 15.0000 [IU] | Freq: Two times a day (BID) | SUBCUTANEOUS | Status: DC

## 2010-07-18 MED ORDER — OXYCODONE HCL 20 MG PO TB12: 20.0000 mg | ORAL_TABLET | Freq: Two times a day (BID) | ORAL | Status: DC

## 2010-07-18 NOTE — Assessment & Plan Note (Signed)
New and severe GI appt pending. R/o PUD.

## 2010-07-18 NOTE — Patient Instructions (Signed)
Use Mylanta or Maalox as needed for indigestion

## 2010-07-18 NOTE — Assessment & Plan Note (Signed)
On Rx 

## 2010-07-18 NOTE — Progress Notes (Signed)
  Subjective:    Patient ID: Lindsey Gomez, female    DOB: Mar 06, 1946, 65 y.o.   MRN: [id number]  HPI The patient presents for a follow-up of  chronic hypertension, chronic dyslipidemia, IDDM controlled with insulin C/o epigastric abd pain. She saw Dr Darnelle Catalan last wk and he ref her to GI. C/o a lot of GERD.     Review of Systems  Constitutional: Positive for appetite change. Negative for fever and activity change.  HENT: Negative for congestion.   Respiratory: Positive for choking. Negative for cough and chest tightness.   Cardiovascular: Positive for chest pain.  Gastrointestinal: Positive for nausea and vomiting. Negative for abdominal distention.       [Bad GERD Genitourinary: Negative for flank pain.  Neurological: Negative for numbness.   Wt Readings from Last 3 Encounters:  07/18/10 128 lb (58.06 kg)  06/19/10 127 lb (57.607 kg)  05/19/10 126 lb (57.153 kg)       Objective:   Physical Exam  Constitutional: She appears well-developed and well-nourished. No distress.  HENT:  Head: Normocephalic.  Right Ear: External ear normal.  Left Ear: External ear normal.  Nose: Nose normal.  Mouth/Throat: Oropharynx is clear and moist.  Eyes: Conjunctivae are normal. Pupils are equal, round, and reactive to light. Right eye exhibits no discharge. Left eye exhibits no discharge.  Neck: Normal range of motion. Neck supple. No JVD present. No tracheal deviation present. No thyromegaly present.  Cardiovascular: Normal rate, regular rhythm and normal heart sounds.   Pulmonary/Chest: No stridor. No respiratory distress. She has no wheezes.  Abdominal: Soft. Bowel sounds are normal. She exhibits no distension and no mass. There is tenderness. There is no rebound and no guarding.  Musculoskeletal: She exhibits no edema and no tenderness.  Lymphadenopathy:    She has no cervical adenopathy.  Neurological: She displays normal reflexes. No cranial nerve deficit. She exhibits normal muscle  tone. Coordination normal.  Skin: No rash noted. No erythema.  Psychiatric: She has a normal mood and affect. Her behavior is normal. Judgment and thought content normal.          Assessment & Plan:  COMMON MIGRAINE Cont w/meds  GERD New and severe GI appt pending. R/o PUD.  DIABETES MELLITUS, TYPE I On Rx  FIBROMYALGIA On rx    STRESS Discussed  Migraines - worse On Rx

## 2010-07-18 NOTE — Assessment & Plan Note (Signed)
Cont w/meds 

## 2010-07-18 NOTE — Assessment & Plan Note (Signed)
On rx 

## 2010-07-25 NOTE — Letter (Signed)
March 02, 2009   Penni Bombard, MD  [facility]- Resident  [address]  [place], [place] [address]   Re:  Lindsey Gomez, Lindsey Gomez                 DOB:  [birth year]   Dear Dr. Melvyn Novas:   I saw the patient back in the office today for a follow up of her  traumatic hemopneumothorax, and her blood pressure was 155/91, pulse 72,  respirations 18, sats were 99%.  Her x-ray showed that her sternum was  stable, which she had injured previously and from the fall from the  horse, her lung is re-expanded.  There is no hemothorax, and you can see  that her rib fractures are stable.  I told her to gradually continue her  same medications, and I will see her back again in 3-4 weeks with  another chest x-ray.   Ines Bloomer, M.D.  Electronically Signed   DPB/MEDQ  D:  03/02/2009  T:  03/02/2009  Job:  [id number]

## 2010-07-25 NOTE — Assessment & Plan Note (Signed)
[facility]   NAMEOCTAVIE, WESTERHOLD                 ACCOUNT NO.:  [id number]   MEDICAL RECORD NO.:  [id number]      DATE OF BIRTH:  03-14-1945   PHYSICIAN:  Maxwell Caul, M.D. VISIT DATE:  06/17/2008                                   OFFICE VISIT   Ms. Cheek is a 65 year old woman who arrives here for our review of a  wound on her left heel on the medial aspect.  The patient tells me that  she put on some snow boots during the winter storm 6 weeks ago.  She  developed a blister.  The blister rapidly enlarged over the next several  weeks and was associated with some discomfort.  She saw Dr. Posey Rea 3-  1/2 weeks ago.  He debrided the ulcer and put her on antibiotics and she  has been applying a topical cream on the wound.  She has now out of  this and applying Neosporin.  She has no prior history of wound.  She is  diabetic since the year 2000.  She does not think she has a history of  neuropathy and/or macrovascular disease.  There is certainly no history  of claudication.   PAST MEDICAL HISTORY:  1. Type 2 diabetes.  2. Hypertension.  3. Migraine headaches.  4. History of breast cancer on the right.  5. History of chronic pancreatitis.  6. History of adrenal insufficiency.  7. Gastroesophageal reflux disease.  8. COPD.  9. Anemia.  10.History of sinus surgery.   SOCIAL HISTORY:  She is a current smoker.  She works as a NIT for the  courts.  She is on disability now.  She is married.   PHYSICAL EXAMINATION:  VASCULAR:  Her peripheral pulses are easily  palpable and intact.  NEUROLOGIC:  Her sensation is intact to light touch.  Capillary refill  time is normal.   WOUND EXAM:  The area on the medial aspect of her left heel was totally  epithelialized.  I debrided and removed some callus from around the  wound.  There is no additional wound present, all that is there is  healed epithelium.   IMPRESSION:  Diabetic foot ulcer.  This is resolved.  I  gave her some  Allevyn adhesive, counseled her about the need to look her feet on a  daily basis.  I told her I thought she could benefit from diabetic  footwear, which she could obtain a prescription from Dr. Posey Rea.  She  does not have any evidence of macrovascular disease or diabetic  neuropathy at this point.  However, she is at continued risk in this  area based on the continued presence of callus, which I removed today.  Other than that, I did not think she needed to be followed here.  Her  wound is resolved already.  We will be available should there be further  problems.           ______________________________  Maxwell Caul, M.D.     MGR/MEDQ  D:  06/17/2008  T:  06/18/2008  Job:  [id number]   cc:   Georgina Quint. Plotnikov, MD

## 2010-07-25 NOTE — Letter (Signed)
February 23, 2009   Penni Bombard, MD  [facility]- Resident  [address]  [place] [address]   Re:  QUANESHA, KLIMASZEWSKI                 DOB:  1946/01/30   Dear Dr. Melvyn Novas:   I appreciate the opportunity of seeing the patient.  As you know, I  initially took care of her back in 2005.  She now has had 2 falls in the  last 6 weeks, with the last one fracturing at least 2 ribs on the right  and a question of a sternal fracture and a right hemothorax.  She is now  seeing for followup of this.  We got a chest x-ray today, which shows  that her right pneumothorax has improved to approximately 15-20%.  Her  rib fractures are stable as well as her sternal fracture.  Her lungs are  clear bilaterally.  She has minimal pain on palpation, although she is  still taking the fentanyl patches that she was prescribed 50 mg.  I am  sure you are aware she takes a lot of medications.  I think we can get  back without any treatment other than observation.  I will plan to see  her back again in 1 week with another chest x-ray.  She is to let me  know if her pulmonary status deteriorates.   Sincerely,   Ines Bloomer, M.D.  Electronically Signed   DPB/MEDQ  D:  02/23/2009  T:  02/24/2009  Job:  [id number]

## 2010-07-25 NOTE — Assessment & Plan Note (Signed)
OFFICE VISIT   Schanz, Elke C  DOB:  22-May-1945                                        March 30, 2009  CHART #:  [id number]   The patient came today.  Her chest x-ray shows that her fractures are  healing.  No recurrence of her hemothorax.  She has quit using her  fentanyl patches.  Her blood pressure is 111/75, pulse 80, respirations  18, sats were 95%.  I told her I would be happy to see her again if she  has any future problems.  Her sternal wires also shows callus formation.   Ines Bloomer, M.D.  Electronically Signed   DPB/MEDQ  D:  03/30/2009  T:  03/30/2009  Job:  [id number]

## 2010-07-28 NOTE — Letter (Signed)
May 21, 2006     RE:  Lindsey Gomez, Lindsey Gomez  MRN:  [id number]  /  DOB:  01-13-1946    Sincerely,      Georgina Quint. Plotnikov, MD    AVP/MedQ  DD: 05/22/2006  DT: 05/22/2006  Job #: [id number]

## 2010-07-28 NOTE — Discharge Summary (Signed)
NAME:  Lindsey Gomez, Lindsey Gomez                           ACCOUNT NO.:  [id number]   MEDICAL RECORD NO.:  [id number]                   PATIENT TYPE:  INP   LOCATION:  2035                                 FACILITY:  [facility]   PHYSICIAN:  Ines Bloomer, M.D.              DATE OF BIRTH:  Nov 21, 1945   DATE OF ADMISSION:  07/29/2003  DATE OF DISCHARGE:  08/03/2003                                 DISCHARGE SUMMARY   ADMISSION DIAGNOSIS:  Left upper lobe lung lesion.   ADDITIONAL DIAGNOSES/DISCHARGE DIAGNOSES:  1. Left upper lobe lung lesion status post left upper lobe wedge resection     on June 29, 2003.  2. Left upper lobe wedge resection positive for granulomatous disease.  3. A 70-pack-year history of tobacco abuse.  4. Severe migraine.  5. Sinusitis.  6. Hypertension.  7. Peptic ulcer disease.  8. Acid reflux.  9. Status post dozens of sinus and facial surgeries due to a trauma accident     with her horse.  10.      History of pancreatic cyst removal with removal of 40% of her     pancreas in 1999 secondary to pancreatitis.  11.      Hysterectomy in 1983.  12.      Left knee surgery at the age of 28.  42.      Iron deficiency anemia.  14.      Postoperative hyperglycemia.   HOSPITAL PROCEDURES/MANAGEMENT:  1. A left upper lobe wedge resection through a left VATS/mini thoracotomy     completed on June 29, 2003, by Dr. Edwyna Shell.  2. Postoperative blood transfusion for anemia.   CONSULTATIONS:  1. Infectious disease.  2. Internal medicine for assistance in managing postoperative hyperglycemia     and profound anemia.  3. Diabetes coordinator.  4. Nutrition.   HISTORY OF PRESENT ILLNESS:  Mrs. Canlas is a pleasant 65 year old female  with a 70-pack-year history of smoking who was referred by Dr. Jacinta Shoe for evaluation of a left lung lesion.  The patient presented in  early March with chest pain and, through evaluation for this, she was found  to have a left lung lesion that  was suspicious for cancer.  A Cardiolite  stress test was completed on June 21, 2003, and was negative.  The patient  was then referred to Dr. Edwyna Shell of [facility] for further evaluation of a left  upper lobe lesion.  The patient underwent a PET scan which was completed on  June 18, 2003, and showed a 1.5 cm left upper lobe lung lesion with an SUV  of 7.3 g/ml.  This was highly suspicious for malignancy.  The patient was  seen again in clinic by Dr. Edwyna Shell after this study, and his impression was  that the patient should proceed with surgical resection for suspicious  malignancy . He felt that patient would benefit fro a left VATS,  possible  thoracotomy, and left upper lobe wedge resection versus lobectomy as well as  mediastinal lymph node dissection for definitive diagnosis and treatment.  Surgery was scheduled then for June 29, 2003.  The risks, benefits, and  alternatives were discussed with the patient prior to surgery.  She was in  agreement and wished to proceed.   HOSPITAL COURSE:  Mrs. Dilauro was electively admitted to [facility]  on June 29, 2003, and underwent a left vas, left main thoracotomy, with  left upper lobe wedge resection, completed by Dr. Edwyna Shell.  Preliminary  intraoperative pathology was positive for granulomatous disease.  Overall,  the patient tolerated this procedure well and was successfully extubated on  the operating room table.  She was then transferred to the postanesthesia  care unit in stable condition.  Once awake, alert, and appropriate, the  patient was then transferred to 3300 for further management.   Over the next several hospital days, the patient remained stable.  Her main  issue was that of pain control.  Her oral pain medications were adjusted  appropriately until an effective regimen was established.  The patient was  transfused two units of packed red blood cells for postoperative iron  deficiency anemia.  The internal  medicine/gastrointestinal specialists were  consulted for the patient's history of peptic ulcer disease/GERD and  ulcerative colitis in the face of postoperative anemia.  The patient was  being seen as an outpatient by Dr. Liam Rogers for these issues but wished to  change providers to Dr. Terrial Rhodes.  She was followed by the internal  medicine service until the time of discharge.   The patient was also seen in consultation by the infectious disease  specialist, Dr. Roxan Hockey, for her diagnosis of granulomatous disease.  His  assistance in managing and further evaluating this issue was greatly  appreciated.   The patient continued to progress from a surgical standpoint.  Her chest  tubes were discontinued without difficulty.  Chest x-ray status post removal  of the last chest tube showed a small apical pneumothorax with improved  subcutaneous air.  The patient's pain was well controlled on oral pain  medications.  She had resumed normal bowel and bladder function.  Her  incisions were healing without evidence of infection.  She remained afebrile  and her vital signs were stable.  Her oxygen saturations were 94 to 99% on  room air.  Her heart was in a regular rate and rhythm bringing normal sinus  rhythm on telemetry.  Her abdomen was benign.  Her lung exam revealed  evidence of subcutaneous air in the left chest and faint basilar crackles.  Her extremities were without edema.  The final CBC prior to discharge reads  as follows:  WBC 6.9, hemoglobin 10.1, hematocrit 30.7, platelets 287.   DISPOSITION:  The patient is being discharged to home in improved and stable  condition on 07/04/03.  Plans have been made for outpatient follow-up with  [facility] Internal Medicine Service with an outpatient colonoscopy by Dr.  Terrial Rhodes.   DISCHARGE MEDICATIONS:  1. OxyContin 20 mg p.o. b.i.d.  2. Zomig 5 mg p.o. p.r.n. for migraines. 3. Nasacort two puffs to each nostril b.i.d.  4. Vicodin 7.5/750  mg p.o. four times daily.  5. Celexa 20 mg p.o. p.r.n.  6. Amitriptyline 50 mg p.o. q.h.s.  7. Prilosec 20 mg p.o. b.i.d.  8. Multivitamin p.o. daily.  9. Lorazepam 1 mg p.o. t.i.d.  10.  Phenergan as needed for nausea with headaches.  11.      Lotrel 10/20 mg p.o. daily.  12.      Atenolol 100 mg p.o. q.a.m.  13.      Pancrelipase 1000 EC four times daily with meals.  14.      Syntest DS tablets once daily.  15.      Wellbutrin 150 mg p.o. daily.  16.      Nicoderm 20 mg Transdermal q.24h.  17.      Nicorette gum 4 mg as needed.  18.      Ferrous sulfate 325 mg p.o. b.i.d.  19.      Flexeril 10 mg p.o. t.i.d. as needed.   DISCHARGE INSTRUCTIONS:  1. Activity:  The patient should avoid driving.  She should avoid heavy     lifting or strenuous activity.  She should continue to walk daily.  She     should continue her breathing exercises for one more week.  2. Diet:  The patient is to follow a constant carbohydrate, low-fat, low-     salt, heart-healthy diet.  3. Wound care:  The patient may shower.  She should wash her incisions daily     with soap and water.  She should notify [facility] if she has any redness,     swelling, or drainage from her incisions.  4. Special instructions:  The patient is to record blood glucose     measurements twice daily (before meals and at bedtime, and present this     to Dr. Posey Rea at the next scheduled appointment.   FOLLOW-UP APPOINTMENTS:  1. The patient is scheduled to see Dr. Posey Rea on Friday, May 6, at 11:15     a.m.  2. The patient is scheduled to see Dr. Terrial Rhodes on 5/18 at 11:15 a.m.  3. The patient is scheduled to see Dr. Edwyna Shell within one week of discharge.     The [facility] office will call the patient with this exact date and time.  4. The patient is to see Dr. Roxan Hockey of infectious disease within two to     three weeks of discharge.  She is to call [phone] to schedule this     appointment date and time.      Carolyn  A. Eustaquio Boyden.                  Ines Bloomer, M.D.    CAF/MEDQ  D:  08/04/2003  T:  08/05/2003  Job:  [id number]   cc:   Ulyess Mort, M.D. [facility]   Rockey Situ. Flavia Shipper., M.D.  [address]  [place]  [place] [address]  Fax: [phone]

## 2010-07-28 NOTE — Discharge Summary (Signed)
NAME:  Lindsey Gomez, Lindsey Gomez                           ACCOUNT NO.:  [id number]   MEDICAL RECORD NO.:  [id number]                   PATIENT TYPE:  INP   LOCATION:  0342                                 FACILITY:  [facility]   PHYSICIAN:  Rene Paci, M.D. [facility]          DATE OF BIRTH:  17-Nov-1945   DATE OF ADMISSION:  10/14/2003  DATE OF DISCHARGE:  10/20/2003                                 DISCHARGE SUMMARY   DISCHARGE DIAGNOSES:  1. Failure to thrive secondary to Serratia sepsis from left empyema, status     post thoracentesis and drain on October 15, 2003.  Home with pigtail drain     and home health care, to be removed as an outpatient by Dr. Edwyna Shell.     Continue p.o. antibiotics.  2. Nausea, vomiting, and weight loss secondary to above.  No evidence of     gastrointestinal pathology with no change in biliary dilatation by MRCP     and normal esophagogastroduodenoscopy as done by gastrointestinal service     on October 18, 2003.  No further gastrointestinal workup at this time.  3. Type 2 diabetes, poorly controlled with hemoglobin A1C of 10, increase     home dose of Lantus, but watch as continuing taper of prednisone.  4. Chronic pancreatitis.  Continue lipase.  5. Hypertension, well controlled on medications.  6. History of depression.  7. History of chronic pain secondary to above with fibromyalgia, on     OxyContin, but weaning dose.  8. History of left upper lobe wedge resection in April 2005, for     granulomatous lung lesion.  9. Incidental gallstones, asymptomatic, no surgical intervention at this     time.  10.      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   DISCHARGE MEDICATIONS:  1. Cipro 500 mg p.o. b.i.d. x14 additional days.  Of note, the patient has     completed six days of IV antibiotics with Zosyn then Rocephin.  Serratia     culture sensitive  to Cipro as well as Bactrim.  2. OxyContin 20 mg p.o. b.i.d.  3. Percocet 5/325 mg one to two p.o. q.6h. breakthrough pain.  4. Wellbutrin 150 mg p.o. daily.  5. Prednisone 30 mg p.o. daily x3 days, then 20 mg p.o. daily x3 days, then     10 mg p.o. daily until otherwise instructed by primary care physician.  6. Tenormin 100 mg p.o. daily.  7. Lantus 15 units subcutaneously q.h.s.  8. Lotrel 5/10 one p.o. daily.  9. Nu-Iron 150 mg p.o. b.i.d.  10.      Elavil 50 mg p.o. q.h.s.  11.      Pancrease one tablet q.a.c. t.i.d.   DISPOSITION:  The patient is discharged home in medically stable condition.  She is  tolerating a regular diet without complaint of pain, nausea,  vomiting.  She is afebrile.  Understands plans for home health care for  pigtail.  Outpatient followup with Dr. Edwyna Shell.  Other hospital followup is  scheduled with primary care physician, Dr. Sonda Primes, for Tuesday,  October 26, 2003, at 1:30 p.m. to review these other chronic medical issues.   HOSPITAL COURSE:  #1 -  LEFT EMPYEMA:  The patient is an unfortunate and  medically complex 65 year old woman with a history of chronic pain and  chronic pancreatitis who underwent left upper lobe lung surgery for a lesion  which was proved to be granulomatous and has since had progressive failure  to thrive symptoms.  She was referred to the hospital on the day of  admission secondary to evidence of left empyema on a CT scan that was done  to evaluate GI symptoms of nausea, vomiting.  There had been question of  failure to thrive with nausea and vomiting due to flare in the patient's  chronic pancreatitis, though she had been asymptomatic for some time and had  no change in her chronic biliary dilation.  Blood cultures as well as  pleural cultures from the left empyema both grew Serratia and the patient  rapidly improved with IV antibiotics;  initially Zosyn then changed to  Rocephin.  CVTS, Dr. Edwyna Shell, was again involved, and  performed a  thoracentesis and then placed a drainage catheter into the chest wall.  These things resulted in good relief;  however, GI evaluation was pursued to  ensure that there were no other sources for her initial complaints.  EGD was  negative except for mild esophagitis.  Biopsy is pending at time of  dictation.  No further GI workup is planned.  The patient will continue p.o.  antibiotics for her infection with slow taper if indicated.  Outpatient  removal of empyema drainage catheter as per Dr. Edwyna Shell.  #2 -  TYPE 2 DIABETES:  The patient's type 2 diabetes has been poorly  controlled as indicated by an A1C of 10.  She has also been on prednisone  for the last several months for her diffuse failure to thrive in association  with questioned Addison's disease.  Her steroid dose was increased at time  of admission due to stress, however, there appears to be no clear-cut  indication for chronic prednisone use, especially in the absence of clear  diagnosis of Addison's disease.  She is to continue outpatient endo  evaluation as she has already begun prior to admission.  At this time, a  decision has been made to begin tapering prednisone.  Dose as indicated  above with further management by her primary care physician with whom this  has been discussed.  #3 -  CHRONIC PAIN:  The patient is feeling remarkably better after this  week's hospitalization and treatment for her underlying infection.  She is  interested in tapering off of her anti-depressants and pain medications.  She has been decreased from OxyContin 30 to 20 mg b.i.d., and may still use  Percocet p.r.n. breakthrough pain.  She has also been discontinued on her  Celexa, but at this time will continue Wellbutrin.  Further taper management  per primary care physician.  Rene Paci, M.D. [facility]    VL/MEDQ  D:  10/20/2003  T:  10/21/2003  Job:  [id number]

## 2010-07-28 NOTE — Letter (Signed)
May 22, 2006    Lindsey Gomez.   RE:  Lindsey Gomez, Lindsey Gomez  MRN:  [id number]  /  DOB:  [birth year]   Dear Lindsey Gomez:   I am writing this letter in regards to my patient, Lindsey Gomez. I  have been seeing her for a number of years for medical problems. She has  been suffering mainly with migraine headaches and diabetes. Both  problems currently are under control. She is able to work 8-hour work  days with no restrictions for lifting or driving. At present, I do not  see medical reasons that will effect her ability to perform her job  duties.   In general, it takes 7 days or longer to obtain medical records from  [facility] Records Department.    Sincerely,      Georgina Quint. Plotnikov, MD  Electronically Signed    AVP/MedQ  DD: 05/22/2006  DT: 05/23/2006  Job #: [id number]

## 2010-07-28 NOTE — Discharge Summary (Signed)
NAME:  Lindsey Gomez, Lindsey Gomez                           ACCOUNT NO.:  [id number]   MEDICAL RECORD NO.:  [id number]                   PATIENT TYPE:  INP   LOCATION:  2035                                 FACILITY:  [facility]   PHYSICIAN:  Ines Bloomer, M.D.              DATE OF BIRTH:  1945/06/19   DATE OF ADMISSION:  07/29/2003  DATE OF DISCHARGE:  08/03/2003                                 DISCHARGE SUMMARY   PRIMARY ADMISSION DIAGNOSIS:  Recurrent left pleural effusion.   ADDITIONAL AND DISCHARGE DIAGNOSES:  1. Recurrent left pleural effusion.  2. Status post recent left video-assisted thoracoscopy with mini thoracotomy     and left upper lobe wedge resection June 29, 2003.  3. History of pancreatitis, in the past requiring partial pancreatectomy.  4. Peptic ulcer disease.  5. Gastroesophageal reflux disease.  6. Hypertension.  7. History of migraines.  8. Anemia.  9. Newly diagnosed diabetes mellitus.   PROCEDURE:  Ultrasound-guided left thoracentesis.   HISTORY:  The patient is a 65 year old [place] female who is status post left  upper lobe wedge resection performed by Dr. 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  Since  surgery, she has been seen on several occasions for followup in our office  and has had persistent left-sided chest pain with associated nausea and  vomiting.  She has also noted some low-grade fevers at home.  On her first  visit back to see Dr. Edwyna Shell, she was noted on chest x-ray to have a left  pleural effusion and underwent a thoracentesis.  On followup visit, she was  feeling much better, and the effusion had resolved.  However, when she  returned again for followup on Jul 22, 2003, her chest x-ray showed  recurrence of the effusion with a low-grade fever and elevation of Soberanis  blood cell count.  Another thoracentesis was performed at that time,  removing 800 ml of fluid and improvement in symptoms  somewhat.  She returned  on Jul 29, 2003, for followup and, despite her two previous thoracenteses,  continued to have shortness of breath as well as low-grade fever, anorexia,  nausea, vomiting, and elevation of Irani blood cell count to 19,000.  A CT  scan was ordered which showed a loculated left pleural effusion.  Dr. Edwyna Shell  felt she would benefit from admission for IV antibiotics and ultrasound-  guided thoracentesis at this time.   HOSPITAL COURSE:  She was admitted and was taken to radiology where she  underwent ultrasound-guided left thoracentesis which yielded approximately  130 ml of reddish-beige fluid.  She tolerated the procedure well and was  transferred to the floor in stable condition.  Since that time, her chest x-  ray has shown consistent improvement.  There has been no evidence of  recurrence of effusion.   While in house, her blood sugars were noted to  be elevated between 300 and  400.  This had also been noted on a previous admission, and she was treated  with sliding scale insulin at that time but had not had a followup with her  primary care physician.  Because of this, hemoglobin A1C was obtained which  was 7.0.  She was placed on sliding scale insulin, and an internal medicine  consult was obtained.  She was started on low-dose Glucophage as well as  carbohydrate-modified diet and sliding scale insulin.  Since that time, her  blood sugars have been better controlled, presently running between 80 and  170.   Her only other problem during this admission has been anemia, which she also  had experienced to some degree postoperatively.  She was started on iron  supplementation.  Her stools were hemoccult negative.  Further workup of her  anemia was initiated with iron studies still pending at this time.  It is  felt that, as she is remaining fairly stable, this can be followed as an  outpatient.   During this admission, she initially had fevers up to 101.  She  was  maintained on Avelox and vancomycin.  All of her fluid cultures have been  negative.  Her Karren blood cell count has trended downward and has  stabilized presently at 13,000.  Urinalysis and other labs were also  negative.  Because she is doing well otherwise, vancomycin was discontinued,  and she was switched to a p.o. dose of Avelox which she will continue  following discharge.   She has a history of migraines and experienced a headache similar to her  migraines while here in the hospital. She was treated with Zomig which she  normally takes at home, and this resolved without difficulty.  She has  otherwise done very well.  She is ambulating independently in the halls and  is tolerating a regular diet.  Her O2 saturation has remained greater than  90% on room air.  She has been afebrile, and all vital signs have been  stable.  Her surgical incision sites have all healed well.  On physical  exam, she has slightly diminished breath sounds in her left base but  otherwise is much improved.  Her labs on the day of discharge show  hemoglobin 8.0, hematocrit 24.6, platelets 750, Navarette count 13.5.  Other  recent lab values show a potassium of 4.4, BUN 5, creatinine 0.7.  It is  felt that, as she is doing well at this time and is remaining stable, she  may be discharged home with careful outpatient followup.   DISCHARGE MEDICATIONS:  1. Avelox 400 mg daily x 1 more week.  2. Glucophage 500 mg daily.  3. Niferex 150 mg b.i.d.  4. Pancrelipase 1000 EC four times a day  5. Syntest DS 1 daily.  6. Atenolol 100 mg daily.  7. Nicotine patch 21 mg daily.  8. Lotrel 10/20 daily.  9. Prilosec 20 mg b.i.d.  10.      Celexa 20 mg daily.  11.      Amitriptyline 50 mg daily.  12.      Lorazepam 1 mg t.i.d.  13.      Nasacort 2 puffs daily.  14.      OxyContin 30 mg b.i.d.  15.      Promethazine 25 mg four times a day p.r.n.  16.      Zomig 5 mg p.r.n.  17.      Diclofenac 75 mg b.i.d.  18.       Multivitamins daily.  19.      Tylox 1 to 2 q.4h. p.r.n. for pain.   DISCHARGE INSTRUCTIONS:  1. She is to refrain from driving, heavy lifting, or strenuous activity.  2. She may continue ambulating daily and using her incentive spirometer.  3. She may shower daily and clean her incisions with soap and water.  4. She will continue a low-fat, low-sodium diet with diabetic restrictions.   FOLLOW UP:  1. She is asked to make an appointment to see Dr. Posey Rea in the next one     week to follow up on diabetes. She has a Glucometer at home and is asked     to check her sugars and document these for Dr. Posey Rea to review.  2. She will also need CBC rechecked in the next 7 to 10 days, and this will     be done on her visit to Dr. Posey Rea.  3. Dr. Edwyna Shell will see her back in the office in one week.  She will have a     chest x-ray at [facility] one hour prior to this     appointment and should bring her films for Dr. Edwyna Shell to review.  4. She is asked to contact our office immediately if she experiences any     problems or has questions.      Coral Ceo, P.A.                        Ines Bloomer, M.D.    GC/MEDQ  D:  08/03/2003  T:  08/04/2003  Job:  [id number]   cc:   Georgina Quint. Plotnikov, M.D. [facility]

## 2010-07-28 NOTE — H&P (Signed)
NAME:  Lindsey, Gomez                           ACCOUNT NO.:  [id number]   MEDICAL RECORD NO.:  [id number]                   PATIENT TYPE:  INP   LOCATION:  0347                                 FACILITY:  [facility]   PHYSICIAN:  Corwin Levins, M.D. [facility]             DATE OF BIRTH:  Nov 06, 1945   DATE OF ADMISSION:  10/14/2003  DATE OF DISCHARGE:                                HISTORY & PHYSICAL   CHIEF COMPLAINT:  Abnormal CT scan on August 3rd consistent with left chest  empyema, elevated blood sugars, and worsening vomiting.   HISTORY OF PRESENT ILLNESS:  [name] is a 65 year old [place] female who saw  gastroenterology, Dr. Terrial Rhodes, on October 13, 2003, with vomiting and  questions related to possible pancreatitis which has become worsened  recently. Abdominal CT was obtained merely for purposes of evaluating  biliary tract and pancreas, but happened to show a large loculated left  pleural effusion, probable empyema. She has a low-grade temperature,  decreased appetite, worsening discomfort over the past several weeks, and  continues with inability to gain weight. Also with recent vomiting and with  enlarged biliary system and questionable stone, it was felt she is due for  hospitalization for further evaluation and management. Also, she has had  CBGs in the 300 to 500 range, more than 500 with increasing polyuria, also  with increasing pedal edema recently.   PAST MEDICAL HISTORY:  Illnesses: 1.  Diabetes mellitus.  1. Status post partial pancreatectomy with pancreatitis, traumatic.  2. Migraine.  3. Depression.  4. Hypertension.  5. Fibromyalgia.   ALLERGIES:  SULFA and AVELOX.   CURRENT MEDICATIONS:  1. Pancrelipase 5 mg q.i.d.  2. Syntest D.S. one p.o. daily.  3. Atenolol 100 mg p.o. daily.  4. Lotrel 5/10 one p.o. daily.  5. Prilosec 20 mg b.i.d.  6. Celexa 20 mg daily.  7. Elavil 50 mg daily.  8. Nasacort AQ daily.  9. OxyContin 30 mg b.i.d.  10.      Prednisone 10  mg five per day total.  11.      Niferex 150 mg p.o. b.i.d.  12.      Multivitamin daily.  13.      Wellbutrin XL 150 mg p.o. daily.  14.      Lantus 16 units subcu daily as she is titrating up as instructed by     Dr. Posey Rea.  15.      Humalog 6 units a.c. b.i.d.  16.      Nicotine patch p.r.n.  17.      Vicodin 750 mg q.i.d. p.r.n.  18.      Zomig 5 mg p.r.n.  19.      Phenergan 25 mg q.i.d. p.r.n.  20.      Lorazepam 1 mg t.i.d. p.r.n.   SOCIAL HISTORY:  Smokes one pack per day. No alcohol.   FAMILY HISTORY:  No diabetes.   REVIEW OF SYSTEMS:  Otherwise noncontributory.   PHYSICAL EXAMINATION:  VITAL SIGNS: [name] is a 65 year old [place] female  with a blood pressure of 100/60, respirations 20, pulse 82, temperature  97.9, weight 100 pounds.  GENERAL: She is thin and chronically ill appearing.  HEENT: Sclera clear. TMs clear. Oropharynx benign.  NECK: Without evidence of lymphadenopathy, JVD, or thyromegaly.  CHEST: With decreased breath sounds in the left base, rather subtle, but no  definite, no rales or wheezing otherwise.  CARDIAC: Regular rate and rhythm.  ABDOMEN: Soft and nontender. Positive bowel sounds. No organomegaly or  masses.  EXTREMITIES: With 1+ edema bilaterally.   Labs on August 3rd reveal hemoglobin of 10.0, sodium 130, Goodpasture blood cell  count approximately 15,000.   CT scan with questionable portal hypertension, questionable biliary sludge  versus stone, and large left pleural effusion, loculated, probable empyema  as above.   ASSESSMENT/PLAN:  1. Left chest effusion, probable empyema. She is to be admitted. Check blood     cultures. Start IV Zosyn. Obtain CVTS consult.  2. Diabetes mellitus. Continue on medications, plus sliding scale insulin.  3. Anemia, overall mild. Follow-up in the a.m.  4. Hyponatremia with pedal edema. Questionably related to portal     hypertension versus anemia versus other. Continue to follow. May need     diuretic.  Questionable biliary stone versus sludge. GI consultation     today.  5. Other medical problems. Continue with her home medications as above.  6. Also of note, she has had some thrush to the mouth recently. She is given     Diflucan 150 mg p.o. times one.                                               Corwin Levins, M.D. [facility]    JWJ/MEDQ  D:  10/14/2003  T:  10/14/2003  Job:  [id number]

## 2010-07-28 NOTE — Op Note (Signed)
NAME:  Lindsey Gomez, Lindsey Gomez                           ACCOUNT NO.:  [id number]   MEDICAL RECORD NO.:  [id number]                   PATIENT TYPE:  INP   LOCATION:  3313                                 FACILITY:  [facility]   PHYSICIAN:  Ines Bloomer, M.D.              DATE OF BIRTH:  [birth year]   DATE OF PROCEDURE:  06/29/2003  DATE OF DISCHARGE:                                 OPERATIVE REPORT   PREOPERATIVE DIAGNOSIS:  Left upper lobe lesion.   POSTOPERATIVE DIAGNOSIS:  Granulomatous process, left upper lobe.   PROCEDURE:  Left video-assisted thoracic surgery, minithoracotomy, wedge  resection left upper lobe lesion.   SURGEON:  Ines Bloomer, M.D.   ASSISTANT:  Rowe Clack, P.A.-C.   ANESTHESIA:  General.   INDICATIONS:  This 65 year old patient who is a smoker was found to have a  left upper lobe lesion and had an SUV of 4.6 on PET scan and was thought it  could possibly be a cancer.   DESCRIPTION OF PROCEDURE:  After percutaneous insertion of all monitoring  lines, the patient underwent general anesthesia.  He was prepped and draped  in the usual sterile manner through a left lateral thoracotomy incision.  After general anesthesia and insertion of a dual lumen tube, she was turned  in the left lateral thoracotomy position.  Two trocar sites were made in the  anterior and posterior axillary lines at the seventh intercostal space.  Two  trocars were inserted and exploration was carried out.  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  The lesion could not be seen on the surface so a 4 cm incision was made  through the fascia.  The incision was made over the fifth intercostal space  around the posterior axillary line.  The latissimus was partially divided.  The serratus was reflected anterior.  The fifth intercostal space was  entered.  A small Tuffier was placed in the space.  The lesion was clamped  with a lung clamp and then  resected with three applications with the EZ 45  stapler.  It was sent for frozen section.  While this frozen section was  being done some 5 nodes, 10L nodes and 11L nodes were dissected free from  the aortopulmonary window as well as around the pulmonary artery in the  process.  The area where the resection was then was further dissected to get  some more 11L nodes off the pulmonary artery and the superior portion of the  fissure was divided with the EZ45 stapler.  Frozen section came back with  granulomatous process.  An area of two small leaks were closed with 3-0  Vicryl.  Two chest tubes were placed through the trocar site. The chest was  closed with three #2 pericostals.  ON-Q catheters were placed underneath the  pericostals.  The muscle was closed with #  1 Vicryl, subcutaneous tissue with  2-0 Vicryl and Dermabond for the skin.  The patient returned to the recovery  room in stable condition.                                               Ines Bloomer, M.D.   DPB/MEDQ  D:  06/29/2003  T:  06/30/2003  Job:  [id number]   cc:   Georgina Quint. Plotnikov, M.D. [facility]

## 2010-07-28 NOTE — H&P (Signed)
NAME:  Linse, ALEJA YEARWOOD                           ACCOUNT NO.:  [id number]   MEDICAL RECORD NO.:  [id number]                   PATIENT TYPE:  INP   LOCATION:  NA                                   FACILITY:  [facility]   PHYSICIAN:  Ines Bloomer, M.D.              DATE OF BIRTH:  1945-08-23   DATE OF ADMISSION:  06/29/2003  DATE OF DISCHARGE:                                HISTORY & PHYSICAL   CHIEF COMPLAINT:  Left upper lobe lung lesion.   HISTORY OF PRESENT ILLNESS:  Mrs. Logsdon is a pleasant 65 year old female  with a 70-pack-year history of smoking, who was referred by Dr. Georgina Quint.  Plotnikov for evaluation of a left lung lesion.  The patient presented in  early March with chest pain and through the evaluation for this, was found  to have a left lung lesion that was suspicious for cancer.  The patient  subsequently had a Cardiolite stress test on June 21, 2003 which, at the  time of this dictation, is presumed negative.  There is no final report of  this test available at the time of this dictation.  Otherwise, the patient  was found to have a left upper lobe lesion on CT scan.  She was referred  then to Dr. Ines Bloomer for further evaluation.  She was seen by Dr.  Edwyna Shell on June 10, 2003 in the CVTS clinic.  At that time, Dr. Edwyna Shell felt  that she should undergo a PET scan for further evaluation.  This was  completed on June 18, 2003, which showed a 1.5-cm left upper lobe lung  lesion.  The lesion read an SUV of 7.3 g/mL, which is suspicious for  malignancy.  The patient was then seen again in clinic by Dr. Edwyna Shell and he  felt that she should proceed with surgical resection.  He felt that she  would benefit from a left VATs, possible thoracotomy and left upper lobe  lobectomy as well as mediastinal lymph node dissection for definitive  diagnosis and treatment.  Tentatively, the surgery is scheduled for June 29, 2003.   The patient presented today for a routine  preoperative history and physical  exam.  Currently, she denies any cough, sputum production, fevers, chills,  night sweats, unexplained weight loss, GERD-like symptoms, shortness of  breath, dyspnea on exertion, PND, angina, arrhythmias or peripheral edema.  The patient also denies any hemoptysis, signs or symptoms of TIA or CVA,  amaurosis fugax, history of pneumonia or history of PE/DVT.   PAST MEDICAL HISTORY:  1. Severe migraines.  2. Sinusitis.  3. Hypertension.  4. Peptic ulcer disease.  5. Acid reflux.   PAST SURGICAL HISTORY:  1. The patient has had dozens of sinus and facial surgeries due to a trauma     accident with her horse.  She states that there is no further plan for  any future sinus surgeries.  2. 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  3. Hysterectomy in 1983.  4. Left knee surgery at the age of 67.   CURRENT MEDICATIONS:  1. OxyContin 20 mg p.o. b.i.d.  2. Zomig 5 mg p.o. p.r.n. for migraines.  3. Nasacort 2 puffs to each nostril b.i.d.  4. Vicodin 7.5/750 mg p.o. 4 times daily  5. Celexa 20 mg p.o. p.r.n.  6. Amitriptyline 50 mg p.o. nightly.  7. Prilosec 20 mg p.o. b.i.d.  8. Multivitamin daily.  9. Lorazepam 1 mg p.o. t.i.d.  10.      Phenergan as needed for nausea related to headaches.  11.      Lotrel 10/20 mg p.o. daily.  12.      Atenolol 100 mg p.o. q.a.m.  13.      Pancrelipase 1000 EC 4 times daily with meals.  14.      Syntest D.S. tablets once daily (hormone supplementation).  15.      Wellbutrin 150 mg p.o. daily.  16.      Nicoderm 20 mg transdermal q.24 h.  17.      Nicorette gum 4 mg as needed.   ALLERGIES:  SULFA allergy.   REVIEW OF SYSTEMS:  Please see HPI for significant positives and negatives,  otherwise, the patient denies any diabetes mellitus, kidney disease,  pulmonary problems or bleeding disease.  She denies any diarrhea,  constipation, dysphagia or abdominal  pain.  She denies any hematuria,  dysuria, frequency or urgency.  She denies any chest pain, palpitations, or  arrhythmias.   FAMILY HISTORY:  Noncontributory, specifically negative for any history of  cancer, hypertension, cardiac disease, pulmonary disease or bleeding  disorders.   SOCIAL HISTORY:  Mrs. Draeger is married for 37 years and has 1 child.  She  currently works as a Actor and enjoys many outdoor  activities, especially working with her horses.  She admits to rare alcohol  use.  She does have a 70-pack-year history of smoking with 2 packs per day  for 35 years of which she quit on June 22, 2003.   PHYSICAL EXAMINATION:  VITAL SIGNS:  Blood pressure is 110/70, pulse is 78  and regular, respirations are 16 and unlabored, height is 5-foot 2-1/2-  inches tall and she weighs 123 pounds.  GENERAL:  This is a 65 year old [place] female who is in no acute distress and  alert and oriented x3.  She is very pleasant and appropriate.  She is well-  appearing and well-nourished.  HEENT:  [place], AT; PERRLA; EOMI.  The patient has upper and lower dentures that  are intact, without oral exudate or sores.  Her oral mucosa is pink and  moist.  NECK:  Supple without JVD, lymphadenopathy or carotid bruit.  There is no  supraclavicular lymphadenopathy.  CHEST:  Breath sounds are clear without wheezes, crackles, rales or rhonchi.  She has normal AP diameter and her breathing is unlabored.  CARDIAC:  Regular rate and rhythm without murmur, gallop or rub.  ABDOMEN:  Soft, nontender, nondistended, with good bowel sounds in all 4  quadrants.  No organomegaly or masses.  GU/RECTAL:  Deferred.  EXTREMITIES:  They are warm and dry without edema.  There is no evidence of  clubbing, cyanosis or ulcerations.  Her peripheral pulses are as follows:  She has 2+ carotid pulses bilaterally, 2+ femoral pulses bilaterally, 2+ dorsalis  pedis pulses bilaterally, 2+ posterior tibial pulses  bilaterally.  NEUROLOGIC:  Grossly intact and nonfocal.  Her gait is steady.  Her muscle  strength is equal and appropriate throughout.   ASSESSMENT AND PLAN:  This is a pleasant 65 year old female with a left  upper lobe lung lesion that is suspicious for malignancy, especially with  her history of smoking.  We will proceed with a left video-assisted  thoracic surgery, possible thoracotomy and mediastinal lymph node dissection  with left upper lobectomy on June 29, 2003.  Dr. Edwyna Shell has seen and  evaluated this patient prior to this admission and has explained the risks  and benefits of the procedure and the patient has agreed to continue.      Carolyn A. Eustaquio Boyden.                  Ines Bloomer, M.D.    CAF/MEDQ  D:  06/25/2003  T:  06/26/2003  Job:  [id number]   cc:   Ines Bloomer, M.D.  [address]  [place]  [place] [address]   Georgina Quint. Plotnikov, M.D. [facility]

## 2010-07-28 NOTE — H&P (Signed)
NAME:  Lindsey Gomez, Lindsey Gomez                           ACCOUNT NO.:  [id number]   MEDICAL RECORD NO.:  [id number]                   PATIENT TYPE:  INP   LOCATION:                                       FACILITY:  [facility]   PHYSICIAN:  Ines Bloomer, M.D.              DATE OF BIRTH:  [birth year]   DATE OF ADMISSION:  07/29/2003  DATE OF DISCHARGE:                                HISTORY & PHYSICAL   CHIEF COMPLAINT:  Recurrent left pleural effusion.   HISTORY OF PRESENT ILLNESS:  The patient is a 65 year old [place] female who  is recently status post a left upper lobe wedge resection, performed by Dr.  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  She has been seen since her surgery several times  for followup in our office and has had persistent left sided chest pain with  associated nausea, vomiting, and night sweats.  She has had some low-grade  fevers at home.  On her first visit back to see Dr. Edwyna Shell, she was noted to  have a left pleural effusion on chest x-ray and underwent a thoracentesis at  that time.  On followup visit, she was feeling much better and the effusion  had not recurred; however, she returned again, on Jul 22, 2003, and once  again had had a recurrence of her effusion with low-grade fever and  elevation of her Kuehnel blood cell count.  Another thoracentesis was  performed, in the office, at that time, removing 800 cc of fluid, and  improving her symptoms somewhat.  She returned today for followup and  despite her two previous thoracentesis taps, a CT scan showed a loculated  left pleural effusion.  Also labs showed an elevation of her Yepiz blood  cell count at 19,000.  She continues to have nausea, vomiting, and anorexia.  She also has had low-grade fevers with chills and night sweats at home.  She  has not specifically had any problems with shortness of breath, coughing, or  wheezing.  She is having some left sided  chest discomfort.  Dr. Edwyna Shell has  seen her today and evaluated her, and feels that she would benefit from  admission for IV antibiotics and an ultrasound guided thoracentesis at this  time.   PAST MEDICAL HISTORY:  1. History of pancreatitis in the past.  2. Peptic ulcer disease.  3. Gastroesophageal reflux disease.  4. Hypertension.  5. History of migraines.   PAST SURGICAL HISTORY:  1. Left knee surgery.  2. Hysterectomy.  3. Partial pancreatectomy and pancreatic cyst removal secondary to     pancreatitis.  4. Multiple sinus and facial surgeries in the past.  5. Left VATS with left mini-thoracotomy and left upper lobe wedge resection     on June 29, 2003.   CURRENT MEDICATIONS:  1. Pancrelipase 1,000 EC q.i.d. with meals.  2. Syntest DS 1 every day.  3. Atenolol 100 mg every day.  4. Nicotine 21 mg patch every day.  5. Wellbutrin 150 mg every day.  6. Lotrel 10/20 every day.  7. Prilosec 20 mg b.i.d.  8. Celexa 20 mg q.p.m.  9. Amitriptyline 50 mg q.p.m.  10.      Lorazepam 1 mg t.i.d.  11.      Promethazine 25 mg q.i.d. p.r.n.  12.      Zomig 5 mg p.r.n. for migraine.  13.      Nasacort AQ two puffs in each nostril every day.  14.      OxyContin 30 mg b.i.d.  15.      Tylox 1-2 q.4h. p.r.n. for pain.  16.      Diclofenac 75 mg b.i.d.  17.      Multivitamin every day.   ALLERGIES:  She reports an allergy to SULFA and the reaction was burning and  redness of her face and neck.   FAMILY HISTORY:  Her mother had hypertension.  She has a brother who has had  a heart murmur.  She denies any other family history of coronary artery  disease, hypertension, diabetes mellitus, strokes, cancer, COPD, or blood  dyscrasias.   SOCIAL HISTORY:  She is married and has one child.  She is employed as a  Actor.  She reports rare alcohol use.  She previously  smoked two packs of cigarettes per day x 35 years and quit in April 2005.   REVIEW OF SYSTEMS:  See  history of present illness for pertinent positives  and negatives.  Also, she continues to have discomfort at her surgical  incision site.  She denies any anterior chest pain, heart palpitations,  shortness of breath, dyspnea on exertion, orthopnea, cough, wheezing,  paroxysmal nocturnal dyspnea, abdominal pain, hematuria, hematemesis,  hematochezia, melena, hemoptysis, dysuria, nocturia, lower extremity edema,  lower extremity claudication symptoms, rest pain, intolerance to heat or  cold.  She has had frequent migraines recently and has required several  doses of Zomig to abort the headaches.  She has had a history of partial  pancreatectomy in the past and takes pancreatic enzymes prior to meals.  She  also has a history of anxiety.  She is currently trying to quit smoking and  is using a nicotine patch and Wellbutrin.   PHYSICAL EXAMINATION:  VITAL SIGNS:  Blood pressure is 108/64, pulse is 92  and regular, respirations 18 and unlabored, temperature 96.8, O2 sat is 98%  on room air.  GENERAL:  This is a thin [place] female in no acute distress.  HEENT:  Normocephalic, atraumatic.  Pupils equal, round and reactive to  light and accommodation.  Extraocular movements intact.  TMs and canals are  clear.  Nares patent bilaterally.  Oropharynx is clear with slight injection  of the posterior pharynx.  She has upper and lower dentures in place.  NECK:  Supple without lymphadenopathy, thyromegaly, or carotid bruits.  HEART:  Regular rate and rhythm without murmurs, rubs or gallops.  LUNGS:  Clear on the right with decreased breath sounds on the left.  She  has a well healed left posterior thoracotomy scar.  ABDOMEN:  Soft, nontender, nondistended with active bowel sounds in all  quadrants.  No masses or hepatosplenomegaly.  EXTREMITIES:  No clubbing, cyanosis, or edema.  She has 2+ femoral, dorsalis pedis, and posterior tibial pulses bilaterally.  NEURO:  Cranial nerves II-XII grossly intact.  Gait is within normal limits.  She is alert and oriented  x 3.   ASSESSMENT/PLAN:  This is a 65 year old [place] female who is status post a  recent left upper lobe wedge resection who presents with recurrent left  pleural effusion.  She will be admitted to [facility], on Jul 29, 2003, and will undergo an ultrasound guided thoracentesis in radiology.  She  will also be started on intravenous antibiotics as well as hydration.      Coral Ceo, P.A.                        Ines Bloomer, M.D.    GC/MEDQ  D:  07/28/2003  T:  07/28/2003  Job:  [id number]   cc:   Georgina Quint. Plotnikov, M.D. [facility]

## 2010-07-28 NOTE — H&P (Signed)
Lindsey Gomez, Lindsey Gomez                 ACCOUNT NO.:  [id number]   MEDICAL RECORD NO.:  [id number]          PATIENT TYPE:  OBV   LOCATION:  0359                         [facility]ACILITY[facility]  [facility]   PHYSICIAN:  Angelia Mould. Derrell Lolling, M.D.DATE OF BIRTH:  [birth year]   DATE OF ADMISSION:  12/02/2003  DATE OF DISCHARGE:                                HISTORY & PHYSICAL   CHIEF COMPLAINT:  Gallstones and abdominal pain.   HISTORY OF PRESENT ILLNESS:  This is a pleasant 65 year old [place] female who  has been having intermittent episodes of upper abdominal pain for some time  now.  She gets nauseated, sometimes vomits.  She has lost some weight.  She  underwent workup, which included an ultrasound, which suggested common bile  duct stones although it was not diagnostic.  They did not see any  gallstones.  An MRCP showed that the common bile duct was dilated but there  was no evidence of common bile duct stones.  However, numerous gallstones  were noted.   She has a past history of traumatic pancreatitis and a subsequent distal  pancreatectomy.  She has diabetes.  Because of the history of pancreatitis  and history of diabetes, we advised her to undergo cholecystectomy.  She is  admitted electively for that surgery.   PAST MEDICAL HISTORY:  1.  Gallstones as described above.  2.  Left empyema, hospitalized in August this year with drainage procedure      supervised by Ines Bloomer, M.D.  3.  Status post left upper lobe wedge resection in April 2005 for      granulomatous lung disease.  4.  Status post traumatic injury to the pancreas.  5.  Status post elective distal pancreatectomy by Dr. Jacquenette Shone at [facility].  6.  Weight loss.  7.  Chronic pancreatitis on pancreatic enzyme supplements.  8.  Hypertension.  9.  History of depression.  10. History of fibromyalgia.  11. 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   CURRENT MEDICATIONS:  1.  __________ DS  tablets one tablet in the morning.  2.  Atenolol 100 mg daily.  3.  Prilosec 20 mg b.i.d.  4.  Citalopram 20 mg once a day in the evening.  5.  Amitriptyline 50 mg one a day in the evening.  6.  Lorazepam 1 mg three times a day as needed.  7.  Phenergan 25 mg four times a day as needed.  8.  Zomig 5 mg as needed for migraines.  9.  Nasacort AQ 16.5 g two puffs each nostril daily.  10. OxyContin 30 mg p.r.n.  11. Tylox p.r.n.  12. Prednisone 10 mg daily, on taper.  13. Lantus insulin and Humalog insulin, vary daily based on blood sugars.  14. She has a 21 mg nicotine patch daily.  15. Centrum multivitamin.   ALLERGIES:  SULFA, AVELOX, ALL SUGAR SUBSTITUTES, MSG causes migraines.   SOCIAL HISTORY:  She smokes tobacco, denies alcohol.  No diabetes.   REVIEW OF SYSTEMS:  All systems reviewed.  They  are noncontributory except  as described above.   PHYSICAL EXAMINATION:  GENERAL:  A thin [place] female in no distress.  She  weighs approximately 102 pounds.  She is in no distress.  HEENT:  Eyes:  Sclerae clear, extraocular movements intact.  Ears, nose,  mouth, and throat:  Nose, lips, tongue, and oropharynx without gross  lesions.  NECK:  No mass, no jugular venous distention.  CHEST:  Lungs clear to auscultation except for decreased breath sounds in  the left base, no rales or rhonchi.  CARDIAC:  Regular rate and rhythm, no murmur.  Radial and femoral pulses are  palpable.  ABDOMEN:  Scaphoid, soft, not really tender.  A transverse incision below  the umbilicus.  Left subcostal incision.  No hernias, no palpable mass.  EXTREMITIES:  She moves all four extremities well without pain or deformity.  There may be trace edema.   ASSESSMENT:  1.  Chronic cholecystitis with cholelithiasis.  She seems to be having      frequent biliary colic.  2.  Status post traumatic pancreatitis.  3.  Status post distal pancreatectomy.  4.  Chronic pancreatic insufficiency.  5.  Status post left upper  lobe resection for granulomatous lung disease.  6.  Status post drainage of empyema, left lung.  7.  Diabetes.  8.  Fibromyalgia.  9.  Migraine headaches.   PLAN:  The patient will undergo laparoscopic cholecystectomy.      HMI/MEDQ  D:  12/02/2003  T:  12/02/2003  Job:  [id number]   cc:   Georgina Quint. Plotnikov, M.D. [facility]   Margaretmary Bayley, M.D.  [address], [address]  [place]  [place] [address]  Fax: [phone]

## 2010-07-28 NOTE — Op Note (Signed)
NAMEASHTAN, LATON                 ACCOUNT NO.:  [id number]   MEDICAL RECORD NO.:  [id number]          PATIENT TYPE:  OBV   LOCATION:  0359                         [facility]ACILITY:  [facility]   PHYSICIAN:  Angelia Mould. Derrell Lolling, M.D.DATE OF BIRTH:  1945/04/18   DATE OF PROCEDURE:  12/02/2003  DATE OF DISCHARGE:                                 OPERATIVE REPORT   PREOPERATIVE DIAGNOSIS:  Chronic cholecystitis with cholelithiasis.   POSTOPERATIVE DIAGNOSIS:  Chronic cholecystitis with cholelithiasis.   OPERATION/PROCEDURE:  Laparoscopic cholecystectomy with intraoperative  cholangiogram.   SURGEON:  Angelia Mould. Derrell Lolling, M.D.   FIRST ASSISTANT:  Anselm Pancoast. Zachery Dakins, M.D.   OPERATIVE INDICATIONS:  This is a 65 year old [place] female with significant  past history of a traumatic injury to the pancreas and an interval elective  distal pancreatectomy at [facility].  She has chronic pancreatitis.  On pancreatic  enzyme supplements.  She has hypertension, depression, and fibromyalgia.  She also has diabetes.  She was hospitalized in August with drainage of an  empyema on the left side by Dr. Edwyna Shell, but she has gotten over that.  She  is status post left upper lobe wedge resection in April, 2005 for  granulomatous lung disease.  She has been having progressive upper abdominal  pain and nausea.  She has had extensive workup, including ultrasound which  showed significantly dilated common bile duct, question of common bile duct  stones but no stones in the gallbladder.  She had an MRCP of the abdomen,  which showed gallstones, dilated biliary ducts, but no evidence of common  bile duct stones.  She is brought to the operating room electively for  cholecystectomy.   OPERATIVE FINDINGS:  The gallbladder was not acutely inflamed.  It was thin-  walled.  There were moderate adhesions to it.  The anatomy of the cystic  duct, cystic artery, and common bile duct were conventional.  The  cholangiogram showed  significant dilatation of the intrahepatic and  extrahepatic biliary tree, but I did not see any true filling defects, and  there was prompt flow of contrast into the duodenum.  It is my presumption  that she either passed some sludge through her common bile duct, where she  may have chronic dilatation related in some way to her past pancreatitis.  The liver looked normal.  There were extensive adhesions to the left upper  quadrant.   OPERATIVE TECHNIQUE:  Following the induction of general endotracheal  anesthesia, the patient's abdomen is prepped and draped in a sterile  fashion.  Marcaine 0.5% with epinephrine was used as a local infiltration  anesthetic.  A transverse incision was made just below the umbilicus.  The  fascia was incised in the midline.  We very carefully traversed the  preperitoneal fatty layer, which was quite dense, but ultimately we were  able to enter the abdominal cavity under direct vision safely and  atraumatically.  The 10 mm Hasson trocar was inserted and secured with a  purse-string suture of 0 Vicryl.  A pneumoperitoneum was created.  The video  camera was inserted with good visualization  and findings, as described  above.  A 10 mm trocar was placed in the subxiphoid region, and two 5 mm  trocars were placed in the right lower quadrant.  The gallbladder fundus was  identified and elevated.  Adhesions were taken down from the body and  infundibulum of the gallbladder.  We were then able to retract the  infundibulum laterally and take the rest of the adhesions down.  We were  able to isolate the cystic duct and create a long, wide window behind it.  The cystic duct was clipped close to the gallbladder.  A cholangiogram  catheter was inserted into the cystic duct.  A cholangiogram was obtained  using the C-arm.  This showed significantly dilated intrahepatic and  extrahepatic biliary tree, but otherwise the anatomy was normal.  I did not  see any filling  defects, and there was no evidence of obstruction.  The  cholangiogram catheter was removed.  The cystic duct was secured with  multiple metal clips and divided.  We isolated the cystic artery was it went  along the gallbladder wall, secured it with multiple metal clips and  divided.  The gallbladder was dissected from its bed with electrocautery and  removed through the umbilical port.  The operative field was copiously  irrigated with saline.  At the completion of the case, the irrigation fluid  was completely clear, and there was no evidence of any bleeding or bile  leak.  All of the trocar sites were inspected, and there was no bleeding.  The pneumoperitoneum was released.  The trocars were removed.  The fascia at  the umbilicus was closed with 0 Vicryl sutures.  The skin incisions were  closed with subcuticular sutures of 4-0 Vicryl and Steri-Strips.  Clean  bandages were placed.  The patient was taken to the recovery room in stable  condition.  Estimated blood loss was about 20 cc.  Complications wee none.  Sponge, needle, and instrument counts were correct.      HMI/MEDQ  D:  12/02/2003  T:  12/02/2003  Job:  [id number]   cc:   Georgina Quint. Plotnikov, M.D. [facility]   Margaretmary Bayley, M.D.  [address]  [place]  [place] [address]  Fax: [phone]   Ines Bloomer, M.D.  [address]  [place]  [place] [address]

## 2010-07-28 NOTE — Op Note (Signed)
NAME:  Lindsey Gomez, Lindsey Gomez                 ACCOUNT NO.:  [id number]   MEDICAL RECORD NO.:  [id number]          PATIENT TYPE:  AMB   LOCATION:  DSC                          FACILITY:  [facility]   PHYSICIAN:  Rose Phi. Maple Hudson, M.D.   DATE OF BIRTH:  28-Nov-1945   DATE OF PROCEDURE:  05/09/2005  DATE OF DISCHARGE:                                 OPERATIVE REPORT   PREOPERATIVE DIAGNOSIS:  1.  Sage I carcinoma right breast.  2.  Abnormal left breast mammogram.   POSTOPERATIVE DIAGNOSIS:  1.  Sage I carcinoma right breast.  2.  Abnormal left breast mammogram.   OPERATION:  1.  Blue dye injection.  2.  Right partial mastectomy with needle localization and specimen      mammogram.  3.  Right sentinel lymph node biopsy.  4.  Left breast biopsy with needle localization and specimen mammogram.   SURGEON:  Rose Phi. Maple Hudson, M.D.   ANESTHESIA:  General.   OPERATIVE PROCEDURE:  Prior to coming to the operating room, localizing  wires had been placed in both breasts and in addition, 1 mCi of technetium  sulfur colloid was injected intradermally of the right breast.   After suitable general anesthesia was induced, 5 mL of a mixture of 2 mL of  methylene blue and 3 mL of injectable saline was injected in the subareolar  breast tissue on the right side and the breast gently massaged for 3  minutes.  We then prepped and draped out both breasts.  The localizing wire  was in the upper inner quadrant of the right breast and a curved incision  was made using the wire as a guide and then the wire and surrounding tissue  were excised and specimen submitted for a specimen mammogram.  While that  was being done, a short transverse right axillary incision was made with  dissection through subcutaneous tissue to the clavipectoral fascia.  With  with some difficulty, I was able to identify a radioactive lymph node that  was not bluish and we submitted it as sentinel node.  I could not see, feel,  or identify any other  palpable in the other lymph nodes.   While those were having their evaluation completed, we turned our attention  to the left side where, with separate instruments, another curved incision  centered in the lower inner quadrant of the left breast was made and the  wire and surrounding tissue were excised.  Specimen mammogram confirmed the  removal of our lesions and ultimately, the sentinel node turned out to be  negative and the margins on the right breast tumor were clean.  After  obtaining hemostasis in all three incisions, we closed the wounds with 3-0  Vicryl, subcuticular 4-0 Monocryl, and Steri-Strips.   Dressings were applied and the patient transferred to recovery room in  satisfactory condition having tolerated procedure well.      Rose Phi. Maple Hudson, M.D.  Electronically Signed     PRY/MEDQ  D:  05/09/2005  T:  05/09/2005  Job:  [id number]

## 2010-08-09 ENCOUNTER — Ambulatory Visit: Payer: BC Managed Care – PPO | Admitting: Internal Medicine

## 2010-08-14 ENCOUNTER — Encounter: Payer: Self-pay | Admitting: Gastroenterology

## 2010-08-14 ENCOUNTER — Ambulatory Visit (INDEPENDENT_AMBULATORY_CARE_PROVIDER_SITE_OTHER): Payer: BC Managed Care – PPO | Admitting: Gastroenterology

## 2010-08-14 VITALS — BP 128/72 | HR 64 | Ht 62.0 in | Wt 126.0 lb

## 2010-08-14 DIAGNOSIS — Z860100 Personal history of colon polyps, unspecified: Secondary | ICD-10-CM | POA: Insufficient documentation

## 2010-08-14 DIAGNOSIS — K219 Gastro-esophageal reflux disease without esophagitis: Secondary | ICD-10-CM

## 2010-08-14 DIAGNOSIS — K2270 Barrett's esophagus without dysplasia: Secondary | ICD-10-CM | POA: Insufficient documentation

## 2010-08-14 MED ORDER — OMEPRAZOLE-SODIUM BICARBONATE 40-1100 MG PO CAPS: ORAL_CAPSULE | ORAL | Status: DC

## 2010-08-14 NOTE — Assessment & Plan Note (Signed)
Last endoscopy 2004.  Plan followup endoscopy

## 2010-08-14 NOTE — Patient Instructions (Addendum)
Upper GI Endoscopy Upper GI endoscopy means using a flexible scope to look at the esophagus, stomach and upper small bowel. This is done to make a diagnosis in people with heartburn, abdominal pain, or abnormal bleeding. Sometimes an endoscope is needed to remove foreign bodies or food that become stuck in the esophagus; it can also be used to take biopsy samples. For the best results, do not eat or drink for 8 hours before having your upper endoscopy.  To perform the endoscopy, you will probably be sedated and your throat will be numbed with a special spray. The endoscope is then slowly passed down your throat (this will not interfere with your breathing). An endoscopy exam takes 15-30 minutes to complete and there is no real pain. Patients rarely remember much about the procedure. The results of the test may take several days if a biopsy or other test is taken.  You may have a sore throat after an endoscopy exam. Serious complications are very rare. Stick to liquids and soft foods until your pain is better. You should not drive a car or operate any dangerous equipment for at least 24 hours after being sedated. SEEK IMMEDIATE MEDICAL CARE IF:  You have severe throat pain.   You have shortness of breath.   You have bleeding problems.   You have a fever.   You have difficulty recovering from your sedation.  Document Released: 04/05/2004 Document Re-Released: 05/23/2009 [facility] Patient Information 2011 [place], [place]. Your EGD is scheduled on 09/05/2010 at 9am  Gastroesophageal Reflux Disease (GERD) Your caregiver has diagnosed your chest discomfort as caused by gastroesophageal reflux disease (GERD). GERD is caused by a reflux of acid from your stomach into the digestive tube between your mouth and stomach (esophagus). Acid in contact with the esophagus causes soreness (inflammation) resulting in heartburn or chest pain. It may cause small holes in the lining of the esophagus  (ulcers). CAUSES  Increased body weight puts pressure on the stomach, making acid rise.   Smoking increases acid production.   Alcohol decreases pressure on the valve between the stomach and esophagus (lower esophageal sphincter), allowing acid from the stomach into the esophagus.   Late evening meals and a full stomach increase pressure and acid production.   Lower esophageal sphincter is malformed.   Sometimes, no reason is found.  HOME CARE INSTRUCTIONS  Change the factors that you can control. Weight, smoking, or alcohol changes may be difficult to change on your own. Your caregiver can provide guidance and medical therapy.   Raising the head of your bed may help you to sleep.   Over-the-counter medicines will decrease acid production. Your caregiver can also prescribe medicines for this. Only take over-the-counter or prescription medicines for pain, discomfort, or fever as directed by your caregiver.   1/2 to 1 teaspoon of an antacid taken every hour while awake, with meals, and at bedtime, can neutralize acid.   DO NOT take aspirin, ibuprofen, or other nonsteroidal anti-inflammatory drugs (NSAIDs).  SEEK IMMEDIATE MEDICAL CARE IF:  The pain changes in location (radiates into arms, neck, jaw, teeth, or back), intensity, or duration.   You start feeling sick to your stomach (nauseous), start throwing up (vomiting), or sweating (diaphoresis).   You develop left arm or jaw pain.   You develop pain going into your back, shortness of breath, or you pass out.   There is vomiting of fluid that is green, yellow, or looks like coffee grounds or blood.  These symptoms could signal  other problems, such as heart disease. MAKE SURE YOU:  Understand these instructions.   Will watch your condition.   Will get help right away if you are not doing well or get worse.  Document Released: 12/06/2004 Document Re-Released: 05/23/2009 [facility] Patient Information 2011 [place], [place].

## 2010-08-14 NOTE — Assessment & Plan Note (Signed)
The patient remained symptomatic, especially at night, despite medications.  Recommendations #1 antireflux measures #2 begin Zegerid 40 mg every morning and each bedtime #3 upper endoscopy

## 2010-08-14 NOTE — Progress Notes (Signed)
History of Present Illness:  Lindsey Gomez is a 65 year old [place] female with a history of diabetes, breast cancer, COPD, questionable chronic pancreatitis,  referred at the request of Dr. Posey Rea for evaluation of reflux. Barrett's esophagus was diagnosed in  2005. 2006 colonoscopy was negative (h/o colon polyps). Despite taking daily omeprazole and ranitidine she is having very frequent pyrosis with regurgitation of gastric contents and sore throat. This is especially prevalent upon retiring. She denies dysphagia. She's on no gastric irritants. She does take narcotics for migraine headaches.    Review of Systems: She has frequent migraine headaches Pertinent positive and negative review of systems were noted in the above HPI section. All other review of systems were otherwise negative.    Current Medications, Allergies, Past Medical History, Past Surgical History, Family History and Social History were reviewed in [employer] [facility] electronic medical record  Vital signs were reviewed in today's medical record. Physical Exam: General: Well developed , well nourished, no acute distress Head: Normocephalic and atraumatic Eyes:  sclerae anicteric, EOMI Ears: Normal auditory acuity Mouth: No deformity or lesions Lungs: Clear throughout to auscultation Heart: Regular rate and rhythm; no murmurs, rubs or bruits Abdomen: Soft, non tender and non distended. No masses, hepatosplenomegaly or hernias noted. Normal Bowel sounds Rectal:deferred Musculoskeletal: Symmetrical with no gross deformities  Pulses:  Normal pulses noted Extremities: No clubbing, cyanosis, edema or deformities noted Neurological: Alert oriented x 4, grossly nonfocal Psychological:  Alert and cooperative. Normal mood and affect

## 2010-08-15 ENCOUNTER — Encounter: Payer: Self-pay | Admitting: Gastroenterology

## 2010-08-16 ENCOUNTER — Telehealth: Payer: Self-pay | Admitting: Gastroenterology

## 2010-08-16 NOTE — Telephone Encounter (Signed)
Instructed pt that she could stop the Ranitadine and take the Zegerid. Pt verbalized understanding.

## 2010-08-18 DIAGNOSIS — Z0279 Encounter for issue of other medical certificate: Secondary | ICD-10-CM

## 2010-08-24 ENCOUNTER — Ambulatory Visit
Admission: RE | Admit: 2010-08-24 | Discharge: 2010-08-24 | Disposition: A | Discharge status: Home / Self Care | Payer: BC Managed Care – PPO | Source: Ambulatory Visit | Attending: Oncology | Admitting: Oncology

## 2010-08-24 DIAGNOSIS — Z9889 Other specified postprocedural states: Secondary | ICD-10-CM

## 2010-09-01 ENCOUNTER — Other Ambulatory Visit: Payer: Self-pay | Admitting: *Deleted

## 2010-09-01 ENCOUNTER — Other Ambulatory Visit (INDEPENDENT_AMBULATORY_CARE_PROVIDER_SITE_OTHER): Payer: BC Managed Care – PPO

## 2010-09-01 ENCOUNTER — Ambulatory Visit (INDEPENDENT_AMBULATORY_CARE_PROVIDER_SITE_OTHER): Payer: Medicare Other | Admitting: Internal Medicine

## 2010-09-01 ENCOUNTER — Encounter: Payer: Self-pay | Admitting: Internal Medicine

## 2010-09-01 VITALS — BP 130/90 | HR 80 | Temp 99.4°F | Resp 16 | Wt 128.0 lb

## 2010-09-01 DIAGNOSIS — R5383 Other fatigue: Secondary | ICD-10-CM

## 2010-09-01 DIAGNOSIS — R296 Repeated falls: Secondary | ICD-10-CM

## 2010-09-01 DIAGNOSIS — R42 Dizziness and giddiness: Secondary | ICD-10-CM

## 2010-09-01 DIAGNOSIS — R5381 Other malaise: Secondary | ICD-10-CM

## 2010-09-01 DIAGNOSIS — Z9181 History of falling: Secondary | ICD-10-CM

## 2010-09-01 DIAGNOSIS — R609 Edema, unspecified: Secondary | ICD-10-CM | POA: Insufficient documentation

## 2010-09-01 DIAGNOSIS — R51 Headache: Secondary | ICD-10-CM

## 2010-09-01 DIAGNOSIS — F329 Major depressive disorder, single episode, unspecified: Secondary | ICD-10-CM

## 2010-09-01 DIAGNOSIS — E109 Type 1 diabetes mellitus without complications: Secondary | ICD-10-CM

## 2010-09-01 DIAGNOSIS — R110 Nausea: Secondary | ICD-10-CM

## 2010-09-01 LAB — PROTIME-INR: Prothrombin Time: 10.2 s (ref 10.2–12.4)

## 2010-09-01 LAB — CBC WITH DIFFERENTIAL/PLATELET
Basophils Absolute: 0 10*3/uL (ref 0.0–0.1)
Eosinophils Absolute: 0.1 10*3/uL (ref 0.0–0.7)
HCT: 31.9 % — ABNORMAL LOW (ref 36.0–46.0)
Lymphs Abs: 1.7 10*3/uL (ref 0.7–4.0)
MCHC: 33.3 g/dL (ref 30.0–36.0)
Monocytes Absolute: 0.4 10*3/uL (ref 0.1–1.0)
Monocytes Relative: 5.1 % (ref 3.0–12.0)
Platelets: 194 10*3/uL (ref 150.0–400.0)
RDW: 16.8 % — ABNORMAL HIGH (ref 11.5–14.6)

## 2010-09-01 LAB — URINALYSIS
Bilirubin Urine: NEGATIVE
Ketones, ur: NEGATIVE
Nitrite: NEGATIVE
Total Protein, Urine: NEGATIVE
pH: 8 (ref 5.0–8.0)

## 2010-09-01 LAB — COMPREHENSIVE METABOLIC PANEL
ALT: 19 U/L (ref 0–35)
CO2: 24 mEq/L (ref 19–32)
Chloride: 105 mEq/L (ref 96–112)
GFR: 76.92 mL/min (ref >60.00)
Sodium: 139 mEq/L (ref 135–145)
Total Bilirubin: 0.2 mg/dL — ABNORMAL LOW (ref 0.3–1.2)
Total Protein: 6.3 g/dL (ref 6.0–8.3)

## 2010-09-01 LAB — CORTISOL: Cortisol, Plasma: 23.6 ug/dL

## 2010-09-01 MED ORDER — OXYCODONE HCL 20 MG PO TB12: 20.0000 mg | ORAL_TABLET | Freq: Two times a day (BID) | ORAL | Status: DC

## 2010-09-01 MED ORDER — OXYCODONE HCL 15 MG PO TABS: 15.0000 mg | ORAL_TABLET | ORAL | Status: DC

## 2010-09-01 MED ORDER — CEFUROXIME AXETIL 500 MG PO TABS: ORAL_TABLET | ORAL | Status: DC

## 2010-09-01 MED ORDER — CEFUROXIME AXETIL 500 MG PO TABS: ORAL_TABLET | ORAL | Status: AC

## 2010-09-01 MED ORDER — PROCHLORPERAZINE MALEATE 10 MG PO TABS: 10.0000 mg | ORAL_TABLET | Freq: Four times a day (QID) | ORAL | Status: DC | PRN

## 2010-09-01 MED ORDER — LORAZEPAM 2 MG PO TABS: 2.0000 mg | ORAL_TABLET | Freq: Three times a day (TID) | ORAL | Status: DC | PRN

## 2010-09-01 MED ORDER — CITALOPRAM HYDROBROMIDE 40 MG PO TABS: 40.0000 mg | ORAL_TABLET | Freq: Every day | ORAL | Status: DC

## 2010-09-01 MED ORDER — HYDROCORTISONE 10 MG PO TABS: 10.0000 mg | ORAL_TABLET | Freq: Two times a day (BID) | ORAL | Status: DC

## 2010-09-01 NOTE — Progress Notes (Signed)
  Subjective:    Patient ID: Lindsey Gomez, female    DOB: 28-Jun-1945, 65 y.o.   MRN: [id number]  HPI   The patient is here to follow up on chronic depression, anxiety, headaches and chronic moderate fibromyalgia and migraines symptoms. [name] DM, HTN C/o dizziness and falls x many times in 2 wks   Review of Systems  Constitutional: Positive for fatigue. Negative for fever, chills, activity change, appetite change and unexpected weight change.  HENT: Negative for congestion, mouth sores and sinus pressure.   Eyes: Negative for itching and visual disturbance.  Respiratory: Negative for cough, chest tightness and wheezing.   Cardiovascular: Positive for leg swelling. Negative for chest pain.  Gastrointestinal: Positive for abdominal pain (R side) and constipation. Negative for nausea.  Genitourinary: Negative for frequency, difficulty urinating and vaginal pain.  Musculoskeletal: Positive for joint swelling and arthralgias. Negative for back pain and gait problem.  Skin: Negative for pallor and rash.  Neurological: Positive for dizziness, weakness and light-headedness. Negative for tremors, numbness and headaches.  Hematological: Negative for adenopathy.  Psychiatric/Behavioral: Negative for suicidal ideas, behavioral problems, confusion, sleep disturbance and agitation.       Objective:   Physical Exam  Constitutional: She appears well-developed and well-nourished. No distress.       Chronically ill appearing NAD  HENT:  Head: Normocephalic.  Right Ear: External ear normal.  Left Ear: External ear normal.  Nose: Nose normal.  Mouth/Throat: Oropharynx is clear and moist.  Eyes: Conjunctivae are normal. Pupils are equal, round, and reactive to light. Right eye exhibits no discharge. Left eye exhibits no discharge.  Neck: Normal range of motion. Neck supple. No JVD present. No tracheal deviation present. No thyromegaly present.  Cardiovascular: Normal rate, regular rhythm and normal  heart sounds.   Pulmonary/Chest: No stridor. No respiratory distress. She has no wheezes.  Abdominal: Soft. Bowel sounds are normal. She exhibits no distension and no mass. There is no tenderness. There is no rebound and no guarding.  Musculoskeletal: She exhibits no edema and no tenderness.  Lymphadenopathy:    She has no cervical adenopathy.  Neurological: She displays normal reflexes. No cranial nerve deficit. She exhibits normal muscle tone. Coordination normal.  Skin: No rash noted. No erythema.  Psychiatric: She has a normal mood and affect. Her behavior is normal. Judgment and thought content normal.        Lab Results  Component Value Date   WBC 7.7 09/01/2010   HGB 10.6* 09/01/2010   HCT 31.9* 09/01/2010   PLT 194.0 09/01/2010   CHOL 226* 06/15/2010   TRIG 213.0* 06/15/2010   HDL 62.60 06/15/2010   LDLDIRECT 131.9 06/15/2010   ALT 19 09/01/2010   AST 22 09/01/2010   NA 139 09/01/2010   K 3.8 09/01/2010   CL 105 09/01/2010   CREATININE 0.8 09/01/2010   BUN 19 09/01/2010   CO2 24 09/01/2010   TSH 1.32 09/01/2010   INR 0.9 09/01/2010   HGBA1C 9.4* 09/01/2010   MICROALBUR 0.9 03/22/2008     Assessment & Plan:    A very complex case overall.   Potential benefits of a long term opioids use as well as potential risks (i.e. addiction risk, apnea etc) and complications (i.e. somnolence, constipation and others) were d/w the patient and were acknowledged again.

## 2010-09-05 ENCOUNTER — Other Ambulatory Visit: Payer: Self-pay | Admitting: Gastroenterology

## 2010-09-05 ENCOUNTER — Encounter: Payer: Self-pay | Admitting: Gastroenterology

## 2010-09-05 ENCOUNTER — Telehealth: Payer: Self-pay

## 2010-09-05 ENCOUNTER — Ambulatory Visit (AMBULATORY_SURGERY_CENTER): Payer: BC Managed Care – PPO | Admitting: Gastroenterology

## 2010-09-05 DIAGNOSIS — K2270 Barrett's esophagus without dysplasia: Secondary | ICD-10-CM

## 2010-09-05 DIAGNOSIS — K219 Gastro-esophageal reflux disease without esophagitis: Secondary | ICD-10-CM

## 2010-09-05 DIAGNOSIS — K222 Esophageal obstruction: Secondary | ICD-10-CM

## 2010-09-05 LAB — GLUCOSE, CAPILLARY

## 2010-09-05 MED ORDER — SODIUM CHLORIDE 0.9 % IV SOLN: 500.0000 mL | INTRAVENOUS | Status: DC

## 2010-09-05 NOTE — Patient Instructions (Addendum)
Barrett's Esophagus The esophagus is the muscular tube that carries food and saliva from the mouth to the stomach. Barrett's esophagus involves changes in the esophagus. Some of its lining is replaced by a type of tissue similar to that found in the intestine. This process is called intestinal metaplasia. While Barrett's esophagus may cause no symptoms itself, a small number of people with this condition develop a relatively rare but often deadly type of cancer of the esophagus. It is called esophageal adenocarcinoma. Barrett's esophagus is associated with the common condition called GERD (gastroesophageal reflux disease). HOW THE ESOPHAGUS WORKS The esophagus carries food, liquids, and saliva from the mouth to the stomach. The stomach acts as a container to start digestion and pump food and liquids into the intestines in a controlled process. Food can then be properly digested over time. Nutrients can be taken in (absorbed) by the intestines. The esophagus moves food to the stomach by coordinated contractions of its muscular lining. This process is automatic. People are usually not aware of it. Many people have felt their esophagus when they:  Swallow something too large.   Try to eat too quickly.   Drink very hot or cold liquids.  They then feel the movement of the food or drink down the esophagus into the stomach. This may be an uncomfortable feeling. DIGESTIVE TRACT The muscular layers of the esophagus are normally pinched together at both the upper and lower ends by muscles. These muscles are called sphincters. When a person swallows, the sphincters relax automatically. This allows food or drink to pass from the mouth, into the stomach. The muscles then close rapidly. This prevents the swallowed food or drink from leaking out of the stomach, back into the esophagus or into the mouth. These muscles make it possible to swallow while lying down or even upside-down. When people belch to release  swallowed air or gas from carbonated beverages, the sphincters relax. Then small amounts of food or drink may come back up, briefly. This condition is called reflux. The esophagus quickly squeezes the material back into the stomach. This is considered normal. These functions of the esophagus are an important part of everyday life. However, people who must have their esophagus removed, for example because of cancer, can live a relatively healthy life without it. GERD (GASTROESOPHAGEAL REFLUX DISEASE) Having some stomach contents (liquids or gas) sometimes reflux (come back up from the stomach into the esophagus) is considered normal. When it happens often, and causes other symptoms, it is considered a medical problem or disease. However, it is not necessarily a serious one or one that requires seeing a caregiver. The stomach produces acid and enzymes to digest food. When this mixture refluxes into the esophagus more often than normal or for a longer period of time than normal, it may produce symptoms. These symptoms are often called acid reflux. They are often described by people as heartburn, indigestion, or "gas". The symptoms often consist of a burning sensation below and behind the lower part of the breastbone or sternum. Almost everyone has experienced these symptoms at least once. This is typically a result of overeating. Other things that provoke GERD symptoms include:  Being overweight.   Eating certain types of foods.   Being pregnant.  In most people, GERD symptoms may last only a short time and require no treatment at all.  More continual symptoms are often quickly relieved by over-the-counter acid-reducing agents, such as antacids. Other drugs used to relieve GERD symptoms are antisecretory drugs,  such as histamine2 (H2) blockers or proton pump inhibitors. People who have symptoms often should talk with their caregiver. Other diseases can have similar symptoms. Prescription medicines,  together with other actions, might be needed to reduce reflux. GERD that is untreated over a long period can lead to problems. An example is an ulcer in the esophagus, that could cause bleeding. Another common problem is scar tissue that blocks the movement of swallowed food and drink through the esophagus. This condition is called stricture. Esophageal reflux may also cause certain less common symptoms. These include hoarseness or chronic cough. It sometimes provokes conditions such as asthma. While most patients find that lifestyle changes and acid-blocking drugs relieve their symptoms, caregivers sometimes advise surgery. Overall, GERD is one of the most common medical conditions. About 20 percent of the population can be affected over a lifetime. GERD AND BARRETT'S ESOPHAGUS The exact causes of Barrett's esophagus are not known. It is thought to be caused in part by the same factors that cause GERD. People who do not have heartburn can have Barrett's esophagus. However, it is found about 3 to 5 times more often in people with this condition. Barrett's esophagus is uncommon in children. The average age at diagnosis is 41. But it is usually difficult to know when the problem started. It is about twice as common in men as in women. It is much more common in [place] men than in men of other races. BARRETT'S ESOPHAGUS AND CANCER OF THE ESOPHAGUS Barrett's esophagus does not cause symptoms itself. However, it seems to precede the development of a particular kind of cancer. This cancer is esophageal adenocarcinoma. The risk of developing this cancer is 30 to 125 times higher in people who have Barrett's esophagus than in people who do not. This type of cancer is increasing quickly in [place] men. The increase is possibly related to the rise in obesity and GERD. For people who have Barrett's esophagus, the risk of getting cancer of the esophagus is small. It is less than 1 percent (0.4 percent to 0.5 percent) per  year. Esophageal adenocarcinoma is often not curable. This is partly because the disease is often discovered at a late stage and treatments are not effective. DIAGNOSIS (HOW TO TELL WHAT IS WRONG) AND SCREENING Diagnosing Barrett's esophagus is not easy. At the present time it cannot be diagnosed based on symptoms, physical exam, or blood tests. The only useful test is upper gastrointestinal endoscopy and biopsy. In this procedure, a flexible tube called an endoscope is used. This tool is like a pencil sized flexible telescope. It has a light and tiny camera. It is passed into the esophagus. If the tissue appears suspicious to your caregiver, biopsies must be done. A biopsy is the removal of a small piece of tissue. This is done using a pincher-like device passed through the endoscope. A pathologist is a specialist who examines body tissue samples. He or she examines the tissue under a microscope to confirm the diagnosis. Looking for a medical problem in people who do not know whether they have one is called screening. Currently, there are no commonly accepted guidelines on who should have endoscopy, to check for Barrett's esophagus. There are many reasons for the lack of firm recommendations about screening. Among them are the great cost and occasional risk of side effects of the test. Also, the rate of finding Barrett's esophagus is low. Finding the problem early has not been proven to prevent deaths from cancer. Many caregivers advise that  adult patients who are over the age of 60 and have had GERD symptoms for a number of years have endoscopy, to see whether they have Barrett's esophagus. Screening for this condition in people who have no symptoms is not advised. TREATMENT Barrett's esophagus has no cure, other than surgical removal of the esophagus. This is a serious operation. Surgery is advised only for people who have a high risk of developing cancer or who already have it. Most caregivers recommend  treating GERD with acid-blocking drugs. This is sometimes linked to improvement in the extent of the Barrett's tissue. But this approach has not been proven to reduce the risk of cancer. Treating reflux with surgery for GERD also does not seem to cure Barrett's esophagus. Several experimental approaches are under study. One attempts to see whether destroying the Barrett's tissue by heat or other means, through an endoscope, can get rid of the condition. But this approach has potential risks and unknown effectiveness. LOOKING FOR DYSPLASIA AND CANCER Occasional endoscopic examinations to look for early warning signs of cancer are generally advised for people who have Barrett's esophagus. This approach is called surveillance. When people who have Barrett's esophagus develop cancer, the process seems to go through an intermediate stage. In this stage cancer cells appear in the Barrett's tissue. This condition is called dysplasia. It can be seen only in biopsies with a microscope. The process is patchy and cannot be seen directly through the endoscope. So, multiple biopsies must be taken. Even then, it can be missed. The process of change from Barrett's to cancer seems to happen only in a few patients. It is less than 1 percent per year. And it happens over a relatively long period of time. Most caregivers advise that patients with Barrett's esophagus undergo occasional endoscopy to have biopsies. The recommended time between endoscopies varies depending on circumstances. The best time interval has not been decided. TREATMENT FOR DYSPLASIA OR ESOPHAGEAL ADENOCARCINOMA If a person with Barrett's esophagus is found to have dysplasia or cancer, the caregiver will usually recommend surgery. This is if the person is strong enough and has a good chance of being cured. The type of surgery may vary. But it usually involves removing most of the esophagus and pulling the stomach up into the chest to attach it to what  remains of the esophagus. Many patients with Barrett's esophagus are elderly. They may have many other medical problems that make surgery unwise. In these patients, other methods to treat dysplasia are being studied. HOPE THROUGH RESEARCH Many important questions about Barrett's esophagus need further research to:  Find better ways to identify people who have the problem.   Find out what causes it.   Test treatments that may prevent or get rid of it.   Find better treatments for people who have Barrett's esophagus with cancer.  The [employer], and the [employer], sponsor research programs to study Barrett's esophagus. IMPORTANT POINTS TO REMEMBER  In Barrett's esophagus, the cells lining the esophagus change. They become similar to the cells lining the intestine.   Barrett's esophagus is connected with gastroesophageal reflux disease or GERD.   A small number of people with Barrett's esophagus may develop esophageal cancer.   Barrett's esophagus is diagnosed by upper gastrointestinal endoscopy and biopsy.   People who have Barrett's esophagus should have periodic esophageal exams.   Taking acid-blocking drugs for GERD may help improve Barrett's esophagus.   Removal of the esophagus is  recommended only for people who have a high risk of developing cancer or who already have it.  ADDITIONAL RESOURCES For more information about GERD or Barrett's esophagus, contact: [employer] [address] [place], [place] [address] Phone: [phone] or [phone] Fax: [phone] Email: [email or web address] [email or web address] Internet: www.iffgd.[facility] [employer] Information Clearinghouse [address] [place], [place] [address] Email: nddic@info [email or web address].si Document Released: 05/19/2003 Document Re-Released: 08/16/2009 [facility] Patient Information 2011  [place], [place].  Continue Zegerid Dr. Marzetta Board office will call you to set up a Gastric Emptying Study

## 2010-09-05 NOTE — Telephone Encounter (Signed)
Pt scheduled for GES at [facility] 09/27/10 arrival time 8:45am, procedure at 9am. Pt to be NPO after midnight and to hold stomach meds for 24hrs prior to exam. Pt scheduled for OV with Dr. Arlyce Dice for 10/02/10@9 :30am. Pt aware of appt dates and times.

## 2010-09-06 ENCOUNTER — Telehealth: Payer: Self-pay | Admitting: *Deleted

## 2010-09-06 NOTE — Telephone Encounter (Signed)
Pt still asleep.  Spoke with pts husband.  Says she is doing fine.  Instructed them to call if they have any questions or concerns.

## 2010-09-09 ENCOUNTER — Encounter: Payer: Self-pay | Admitting: Internal Medicine

## 2010-09-09 NOTE — Assessment & Plan Note (Signed)
Cut back on pain meds. Check labs to r/o abnormalities and r/o infection

## 2010-09-09 NOTE — Assessment & Plan Note (Signed)
On Rx 

## 2010-09-09 NOTE — Assessment & Plan Note (Signed)
On Rx - we discussed situational component

## 2010-09-09 NOTE — Assessment & Plan Note (Signed)
Discussed GI consult. Use prn meds

## 2010-09-09 NOTE — Assessment & Plan Note (Signed)
Cont Rx 

## 2010-09-15 ENCOUNTER — Encounter: Payer: Self-pay | Admitting: Internal Medicine

## 2010-09-15 ENCOUNTER — Telehealth: Payer: Self-pay

## 2010-09-15 ENCOUNTER — Ambulatory Visit (INDEPENDENT_AMBULATORY_CARE_PROVIDER_SITE_OTHER): Payer: BC Managed Care – PPO | Admitting: Internal Medicine

## 2010-09-15 DIAGNOSIS — M79609 Pain in unspecified limb: Secondary | ICD-10-CM

## 2010-09-15 DIAGNOSIS — M7989 Other specified soft tissue disorders: Secondary | ICD-10-CM

## 2010-09-15 DIAGNOSIS — R404 Transient alteration of awareness: Secondary | ICD-10-CM

## 2010-09-15 DIAGNOSIS — J019 Acute sinusitis, unspecified: Secondary | ICD-10-CM

## 2010-09-15 DIAGNOSIS — R5381 Other malaise: Secondary | ICD-10-CM

## 2010-09-15 DIAGNOSIS — J3489 Other specified disorders of nose and nasal sinuses: Secondary | ICD-10-CM

## 2010-09-15 DIAGNOSIS — R5383 Other fatigue: Secondary | ICD-10-CM

## 2010-09-15 DIAGNOSIS — IMO0001 Reserved for inherently not codable concepts without codable children: Secondary | ICD-10-CM

## 2010-09-15 DIAGNOSIS — R609 Edema, unspecified: Secondary | ICD-10-CM

## 2010-09-15 MED ORDER — CEFUROXIME AXETIL 500 MG PO TABS: ORAL_TABLET | ORAL | Status: AC

## 2010-09-15 MED ORDER — CEFUROXIME AXETIL 500 MG PO TABS: ORAL_TABLET | ORAL | Status: DC

## 2010-09-15 NOTE — Telephone Encounter (Signed)
Patient asked what medications she had to hold 2 days prior to study. Told pt to hold stomach meds 2 days before study. Pt also states she was told on prior tests and surgeries that she can take 2 no dose tablets with a small sip of water because she has to have caffeine every morning for to not have a severe migraine and have N/V. Told patient that she cannot take any medicine that can alter the test that morning and I even called the radiology department to ask if she could take the medicine and they also state she cannot. Informed patient and she states she will probable have N/V before the test and I told her that we would rather her take that chance then to alter the study before we even try to get results. Pt asked if we could give her a Demerol injection after the test. Told patient that we will not order this for to get after the study. She states she might call her PCP after the study if her migraine is that severe. Told her that was fine but there is no guarantee that he would order the injection for her. Just told patient that we cannot order this injection for her to have done at the hospital and we do not carry Demerol in the office at all. Pt agreed and will call back if she has any further questions.

## 2010-09-15 NOTE — Progress Notes (Signed)
  Subjective:    Patient ID: Lindsey Gomez, female    DOB: 01-21-1946, 65 y.o.   MRN: [id number]  HPI [name] HAs, green nasal d/c, not feeling well C/o L leg swelling and pain at times   Review of Systems  Constitutional: Positive for fatigue. Negative for chills, activity change, appetite change and unexpected weight change.  HENT: Negative for congestion, mouth sores and sinus pressure.   Eyes: Negative for visual disturbance.  Respiratory: Negative for cough and chest tightness.   Gastrointestinal: Negative for nausea and abdominal pain.  Genitourinary: Negative for frequency, difficulty urinating and vaginal pain.  Musculoskeletal: Positive for myalgias. Negative for back pain and gait problem.       L leg swells  Skin: Negative for pallor and rash.  Neurological: Positive for dizziness, weakness and headaches. Negative for tremors and numbness.  Psychiatric/Behavioral: Negative for confusion and sleep disturbance.       Objective:   Physical Exam  Constitutional: She appears well-developed and well-nourished. No distress.       tired  HENT:  Head: Normocephalic.  Right Ear: External ear normal.  Left Ear: External ear normal.  Nose: Nose normal.  Mouth/Throat: Oropharynx is clear and moist.  Eyes: Conjunctivae are normal. Pupils are equal, round, and reactive to light. Right eye exhibits no discharge. Left eye exhibits no discharge.  Neck: Normal range of motion. Neck supple. No JVD present. No tracheal deviation present. No thyromegaly present.  Cardiovascular: Normal rate, regular rhythm and normal heart sounds.   Pulmonary/Chest: No stridor. No respiratory distress. She has no wheezes.  Abdominal: Soft. Bowel sounds are normal. She exhibits no distension and no mass. There is no tenderness. There is no rebound and no guarding.  Musculoskeletal: She exhibits edema (1+ LLE) and tenderness.  Lymphadenopathy:    She has no cervical adenopathy.  Neurological: She displays  normal reflexes. No cranial nerve deficit. She exhibits normal muscle tone. Coordination normal.  Skin: No rash noted. No erythema.  Psychiatric: Her behavior is normal. Judgment and thought content normal.       In fair mood now          Assessment & Plan:  Procedure Note :    Procedure :   Sonography examination   Indication: sinus pain and L>>R LE swelling and pain    Equipment used: Sonosite M-Turbo with an HFL38x/13-6 MHz transducer linear probe. The images were stored in the unit and later transferred in storage.   The patient was placed in a sitting upright position.  This study revealed  Presence of posterior R  max sinus wall c/w fluid presence. L max sinus was clear  Impression: R max sinusitis        Procedure :   Sonography examination of  LLE   Indication: LLE swelling and pain    Equipment used: Sonosite M-Turbo with an HFL38x/13-6 MHz transducer linear probe. The images were stored in the unit and later transferred in storage.  The patient was placed in a decubitus position with her LLE slightly flexed. Two point  transducer compression venous test was performed.   This study revealed fully compressible femoral and popliteal  veins   Impression: No DVT in LLE     LLE  - no DVT

## 2010-09-17 ENCOUNTER — Encounter: Payer: Self-pay | Admitting: Internal Medicine

## 2010-09-17 NOTE — Assessment & Plan Note (Signed)
On Rx 

## 2010-09-17 NOTE — Assessment & Plan Note (Signed)
Resolved

## 2010-09-17 NOTE — Assessment & Plan Note (Signed)
[place] c/w R max sinus opacification Given an abx

## 2010-09-17 NOTE — Assessment & Plan Note (Signed)
[place] was neg for DVT on L

## 2010-09-27 ENCOUNTER — Encounter (HOSPITAL_COMMUNITY)
Admission: RE | Admit: 2010-09-27 | Discharge: 2010-09-27 | Disposition: A | Discharge status: Home / Self Care | Payer: BC Managed Care – PPO | Source: Ambulatory Visit | Attending: Gastroenterology | Admitting: Gastroenterology

## 2010-09-27 DIAGNOSIS — R109 Unspecified abdominal pain: Secondary | ICD-10-CM | POA: Insufficient documentation

## 2010-09-27 MED ORDER — TECHNETIUM TC 99M SULFUR COLLOID
2.1000 | Freq: Once | INTRAVENOUS | Status: AC | PRN
  Administered 2010-09-27: 2.1 via INTRAVENOUS

## 2010-09-29 ENCOUNTER — Encounter: Payer: Self-pay | Admitting: Internal Medicine

## 2010-09-29 ENCOUNTER — Ambulatory Visit (INDEPENDENT_AMBULATORY_CARE_PROVIDER_SITE_OTHER): Payer: BC Managed Care – PPO | Admitting: Internal Medicine

## 2010-09-29 DIAGNOSIS — R110 Nausea: Secondary | ICD-10-CM

## 2010-09-29 DIAGNOSIS — IMO0001 Reserved for inherently not codable concepts without codable children: Secondary | ICD-10-CM

## 2010-09-29 DIAGNOSIS — K861 Other chronic pancreatitis: Secondary | ICD-10-CM

## 2010-09-29 DIAGNOSIS — G43009 Migraine without aura, not intractable, without status migrainosus: Secondary | ICD-10-CM

## 2010-09-29 DIAGNOSIS — F329 Major depressive disorder, single episode, unspecified: Secondary | ICD-10-CM

## 2010-09-29 DIAGNOSIS — F3289 Other specified depressive episodes: Secondary | ICD-10-CM

## 2010-09-29 DIAGNOSIS — B079 Viral wart, unspecified: Secondary | ICD-10-CM | POA: Insufficient documentation

## 2010-09-29 DIAGNOSIS — I10 Essential (primary) hypertension: Secondary | ICD-10-CM

## 2010-09-29 DIAGNOSIS — E559 Vitamin D deficiency, unspecified: Secondary | ICD-10-CM

## 2010-09-29 MED ORDER — OXYCODONE HCL 20 MG PO TB12: 20.0000 mg | ORAL_TABLET | Freq: Two times a day (BID) | ORAL | Status: DC

## 2010-09-29 MED ORDER — LORAZEPAM 2 MG PO TABS: 2.0000 mg | ORAL_TABLET | Freq: Three times a day (TID) | ORAL | Status: DC | PRN

## 2010-09-29 MED ORDER — CITALOPRAM HYDROBROMIDE 40 MG PO TABS: 40.0000 mg | ORAL_TABLET | Freq: Every day | ORAL | Status: DC

## 2010-09-29 MED ORDER — CITALOPRAM HYDROBROMIDE 20 MG PO TABS: 20.0000 mg | ORAL_TABLET | Freq: Every day | ORAL | Status: DC

## 2010-09-29 MED ORDER — OXYCODONE HCL 15 MG PO TABS: 15.0000 mg | ORAL_TABLET | ORAL | Status: DC

## 2010-09-29 NOTE — Assessment & Plan Note (Signed)
Cont Rx 

## 2010-09-29 NOTE — Assessment & Plan Note (Signed)
On Rx prn 

## 2010-09-29 NOTE — Assessment & Plan Note (Signed)
Procedure Note :     Procedure : Cryosurgery   Indication:  Wart(s)     Risks including unsuccessful procedure , bleeding, infection, bruising, scar, a need for a repeat  procedure and others were explained to the patient in detail as well as the benefits. Informed consent was obtained verbally.    1 lesion(s)  on R cheek   was/were treated with liquid nitrogen on a Q-tip in a usual fasion . Band-Aid was applied and antibiotic ointment was given for a later use.   Tolerated well. Complications none.   Postprocedure instructions :     Keep the wounds clean. You can wash them with liquid soap and water. Pat dry with gauze or a Kleenex tissue  Before applying antibiotic ointment and a Band-Aid.   You need to report immediately  if  any signs of infection develop.

## 2010-09-29 NOTE — Assessment & Plan Note (Signed)
On Rx 

## 2010-09-29 NOTE — Progress Notes (Signed)
  Subjective:    Patient ID: Lindsey Gomez, female    DOB: 1945-08-08, 65 y.o.   MRN: [id number]  HPI  The patient presents for a follow-up of  chronic hypertension, chronic dyslipidemia, type 2 diabetes(IDDM), CFS, HAs controlled partially with medicines C/o a wart on face  Review of Systems  Constitutional: Positive for fatigue. Negative for chills, activity change, appetite change and unexpected weight change.  HENT: Negative for congestion, mouth sores and sinus pressure.   Eyes: Negative for visual disturbance.  Respiratory: Negative for cough and chest tightness.   Gastrointestinal: Negative for nausea and abdominal pain.  Genitourinary: Negative for frequency, difficulty urinating and vaginal pain.  Musculoskeletal: Positive for myalgias. Negative for back pain and gait problem.       L leg swells  Skin: Negative for pallor and rash.  Neurological: Positive for dizziness, weakness and headaches. Negative for tremors and numbness.  Psychiatric/Behavioral: Negative for confusion and sleep disturbance.       Objective:   Physical Exam  Constitutional: She appears well-developed and well-nourished. No distress.       tired  HENT:  Head: Normocephalic.  Right Ear: External ear normal.  Left Ear: External ear normal.  Nose: Nose normal.  Mouth/Throat: Oropharynx is clear and moist.  Eyes: Conjunctivae are normal. Pupils are equal, round, and reactive to light. Right eye exhibits no discharge. Left eye exhibits no discharge.  Neck: Normal range of motion. Neck supple. No JVD present. No tracheal deviation present. No thyromegaly present.  Cardiovascular: Normal rate, regular rhythm and normal heart sounds.   Pulmonary/Chest: No stridor. No respiratory distress. She has no wheezes.  Abdominal: Soft. Bowel sounds are normal. She exhibits no distension and no mass. There is no tenderness. There is no rebound and no guarding.  Musculoskeletal: She exhibits no edema (1+ LLE) and no  tenderness.  Lymphadenopathy:    She has no cervical adenopathy.  Neurological: She displays normal reflexes. No cranial nerve deficit. She exhibits normal muscle tone. Coordination normal.  Skin: No rash noted. No erythema.       R cheek 3 mm wart  Psychiatric: Her behavior is normal. Judgment and thought content normal.       In fair mood now          Assessment & Plan:

## 2010-09-29 NOTE — Assessment & Plan Note (Signed)
Watching Sx's

## 2010-09-29 NOTE — Patient Instructions (Signed)
   Postprocedure instructions :     Keep the wounds clean. You can wash them with liquid soap and water. Pat dry with gauze or a Kleenex tissue  Before applying antibiotic ointment and a Band-Aid.   You need to report immediately  if  any signs of infection develop.    

## 2010-10-02 ENCOUNTER — Ambulatory Visit (INDEPENDENT_AMBULATORY_CARE_PROVIDER_SITE_OTHER): Payer: BC Managed Care – PPO | Admitting: Gastroenterology

## 2010-10-02 ENCOUNTER — Other Ambulatory Visit (INDEPENDENT_AMBULATORY_CARE_PROVIDER_SITE_OTHER): Payer: BC Managed Care – PPO

## 2010-10-02 ENCOUNTER — Encounter: Payer: Self-pay | Admitting: Gastroenterology

## 2010-10-02 VITALS — BP 126/68 | HR 88 | Ht 62.0 in | Wt 125.0 lb

## 2010-10-02 DIAGNOSIS — K921 Melena: Secondary | ICD-10-CM | POA: Insufficient documentation

## 2010-10-02 DIAGNOSIS — D509 Iron deficiency anemia, unspecified: Secondary | ICD-10-CM

## 2010-10-02 DIAGNOSIS — K2270 Barrett's esophagus without dysplasia: Secondary | ICD-10-CM

## 2010-10-02 DIAGNOSIS — D649 Anemia, unspecified: Secondary | ICD-10-CM

## 2010-10-02 DIAGNOSIS — K219 Gastro-esophageal reflux disease without esophagitis: Secondary | ICD-10-CM

## 2010-10-02 LAB — CBC WITH DIFFERENTIAL/PLATELET
Basophils Relative: 0.6 % (ref 0.0–3.0)
Eosinophils Absolute: 0.1 10*3/uL (ref 0.0–0.7)
Eosinophils Relative: 2.2 % (ref 0.0–5.0)
HCT: 31.5 % — ABNORMAL LOW (ref 36.0–46.0)
Hemoglobin: 10.4 g/dL — ABNORMAL LOW (ref 12.0–15.0)
Lymphs Abs: 1.8 10*3/uL (ref 0.7–4.0)
MCHC: 32.9 g/dL (ref 30.0–36.0)
MCV: 84.8 fl (ref 78.0–100.0)
Monocytes Absolute: 0.4 10*3/uL (ref 0.1–1.0)
Neutro Abs: 3.8 10*3/uL (ref 1.4–7.7)
RBC: 3.72 Mil/uL — ABNORMAL LOW (ref 3.87–5.11)
WBC: 6.2 10*3/uL (ref 4.5–10.5)

## 2010-10-02 MED ORDER — OMEPRAZOLE-SODIUM BICARBONATE 40-1100 MG PO CAPS: 1.0000 | ORAL_CAPSULE | Freq: Every day | ORAL | Status: AC

## 2010-10-02 MED ORDER — OMEPRAZOLE-SODIUM BICARBONATE 40-1100 MG PO CAPS: ORAL_CAPSULE | ORAL | Status: DC

## 2010-10-02 NOTE — Assessment & Plan Note (Signed)
Stable with no evidence for dysplasia.  Recommendations #1 continue PPI therapy #2 followup endoscopy in 3 years

## 2010-10-02 NOTE — Patient Instructions (Signed)
You will go to the basement for your lab kit today You will have labs drawn today

## 2010-10-02 NOTE — Progress Notes (Signed)
Mrs. Lindsey Gomez has returned following upper endoscopy.  Exam was unrevealing except for the presence of Barrett's esophagus. No dysplasia was seen by biopsy.  A gastric emptying scan was normal. Reflux symptoms are fairly well-controlled with Zegerid. She does experience occasional water brash when bending over.  Mrs. Lindsey Gomez has been complaining of intermittent black solid stools. She denies hematochezia.  Colonoscopy in 2006 was normal.

## 2010-10-02 NOTE — Assessment & Plan Note (Signed)
Check CBC and Hemoccults

## 2010-10-02 NOTE — Assessment & Plan Note (Signed)
Plan to continue Zegerid 

## 2010-10-04 ENCOUNTER — Ambulatory Visit: Payer: BC Managed Care – PPO | Admitting: Internal Medicine

## 2010-10-30 ENCOUNTER — Encounter: Payer: Self-pay | Admitting: Internal Medicine

## 2010-10-30 ENCOUNTER — Ambulatory Visit (INDEPENDENT_AMBULATORY_CARE_PROVIDER_SITE_OTHER): Payer: BC Managed Care – PPO | Admitting: Internal Medicine

## 2010-10-30 DIAGNOSIS — F29 Unspecified psychosis not due to a substance or known physiological condition: Secondary | ICD-10-CM

## 2010-10-30 DIAGNOSIS — F329 Major depressive disorder, single episode, unspecified: Secondary | ICD-10-CM

## 2010-10-30 DIAGNOSIS — G43009 Migraine without aura, not intractable, without status migrainosus: Secondary | ICD-10-CM

## 2010-10-30 DIAGNOSIS — Z720 Tobacco use: Secondary | ICD-10-CM

## 2010-10-30 DIAGNOSIS — R5383 Other fatigue: Secondary | ICD-10-CM

## 2010-10-30 DIAGNOSIS — F3289 Other specified depressive episodes: Secondary | ICD-10-CM

## 2010-10-30 DIAGNOSIS — J209 Acute bronchitis, unspecified: Secondary | ICD-10-CM | POA: Insufficient documentation

## 2010-10-30 DIAGNOSIS — E559 Vitamin D deficiency, unspecified: Secondary | ICD-10-CM

## 2010-10-30 DIAGNOSIS — B079 Viral wart, unspecified: Secondary | ICD-10-CM

## 2010-10-30 DIAGNOSIS — J449 Chronic obstructive pulmonary disease, unspecified: Secondary | ICD-10-CM

## 2010-10-30 DIAGNOSIS — F17200 Nicotine dependence, unspecified, uncomplicated: Secondary | ICD-10-CM

## 2010-10-30 DIAGNOSIS — E109 Type 1 diabetes mellitus without complications: Secondary | ICD-10-CM

## 2010-10-30 DIAGNOSIS — J4489 Other specified chronic obstructive pulmonary disease: Secondary | ICD-10-CM

## 2010-10-30 DIAGNOSIS — R5381 Other malaise: Secondary | ICD-10-CM

## 2010-10-30 DIAGNOSIS — IMO0001 Reserved for inherently not codable concepts without codable children: Secondary | ICD-10-CM

## 2010-10-30 MED ORDER — AZITHROMYCIN 250 MG PO TABS: 250.0000 mg | ORAL_TABLET | Freq: Every day | ORAL | Status: AC

## 2010-10-30 MED ORDER — CALCITRIOL 0.5 MCG PO CAPS: 0.5000 ug | ORAL_CAPSULE | Freq: Two times a day (BID) | ORAL | Status: DC

## 2010-10-30 MED ORDER — ERYTHROMYCIN 5 MG/GM OP OINT: TOPICAL_OINTMENT | Freq: Every day | OPHTHALMIC | Status: AC

## 2010-10-30 MED ORDER — OXYCODONE HCL 20 MG PO TB12: 20.0000 mg | ORAL_TABLET | Freq: Two times a day (BID) | ORAL | Status: DC

## 2010-10-30 MED ORDER — OXYCODONE HCL 15 MG PO TABS: 15.0000 mg | ORAL_TABLET | ORAL | Status: DC

## 2010-10-30 NOTE — Assessment & Plan Note (Signed)
Discussed.

## 2010-10-30 NOTE — Assessment & Plan Note (Signed)
Advised not to smoke

## 2010-10-30 NOTE — Assessment & Plan Note (Addendum)
Discussed.

## 2010-10-30 NOTE — Assessment & Plan Note (Signed)
Zpac 

## 2010-10-30 NOTE — Assessment & Plan Note (Addendum)
On Rx 

## 2010-10-30 NOTE — Progress Notes (Signed)
  Subjective:    Patient ID: Lindsey Gomez, female    DOB: October 15, 1945, 65 y.o.   MRN: [id number]  HPI  The patient presents for a follow-up of  chronic hypertension, chronic dyslipidemia, type 2 diabetes [name] FMS/chronic pain syndrome, headaches, controlled with medicines. She cut back on some meds. C/o something in L eye after weed eating...better now C/o cough w/green sputum and chest tight x 2 wks    Review of Systems  Constitutional: Positive for chills. Negative for activity change, appetite change, fatigue and unexpected weight change.  HENT: Negative for congestion, mouth sores and sinus pressure.   Eyes: Positive for redness (L eye). Negative for visual disturbance.  Respiratory: Positive for cough (productive). Negative for chest tightness.   Gastrointestinal: Negative for nausea and abdominal pain.  Genitourinary: Negative for frequency, difficulty urinating and vaginal pain.  Musculoskeletal: Positive for myalgias, back pain and arthralgias. Negative for gait problem.  Skin: Negative for pallor and rash.  Neurological: Negative for dizziness, tremors, weakness, numbness and headaches.  Psychiatric/Behavioral: Negative for confusion and sleep disturbance. The patient is nervous/anxious.        Objective:   Physical Exam  Constitutional: She appears well-developed and well-nourished. No distress.  HENT:  Head: Normocephalic.  Right Ear: External ear normal.  Left Ear: External ear normal.  Nose: Nose normal.  Mouth/Throat: Oropharynx is clear and moist.  Eyes: Conjunctivae are normal. Pupils are equal, round, and reactive to light. Right eye exhibits no discharge. Left eye exhibits no discharge.       B eyes WNL w/an ophthalmoscope exam  Neck: Normal range of motion. Neck supple. No JVD present. No tracheal deviation present. No thyromegaly present.  Cardiovascular: Normal rate, regular rhythm and normal heart sounds.   Pulmonary/Chest: No stridor. No respiratory  distress. She has no wheezes.  Abdominal: Soft. Bowel sounds are normal. She exhibits no distension and no mass. There is no tenderness. There is no rebound and no guarding.  Musculoskeletal: She exhibits tenderness (LS is tender). She exhibits no edema.  Lymphadenopathy:    She has no cervical adenopathy.  Neurological: She displays normal reflexes. No cranial nerve deficit. She exhibits normal muscle tone. Coordination normal.  Skin: No rash noted. No erythema.  Psychiatric: She has a normal mood and affect. Her behavior is normal. Judgment and thought content normal.   Wart on finger       Assessment & Plan:    A complex case

## 2010-10-30 NOTE — Assessment & Plan Note (Signed)
Cont Rx 

## 2010-10-30 NOTE — Assessment & Plan Note (Addendum)
Procedure Note :     Procedure : Cryosurgery   Indication:  Wart(s) L index finger  Risks including unsuccessful procedure , bleeding, infection, bruising, scar, a need for a repeat  procedure and others were explained to the patient in detail as well as the benefits. Informed consent was obtained verbally.   1  lesion(s)  on  L index finger prox phalanx  was/were treated with liquid nitrogen on a Q-tip in a usual fasion . Band-Aid was applied and antibiotic ointment was given for a later use.   Tolerated well. Complications none.   Postprocedure instructions :     Keep the wound clean. You can wash them with liquid soap and water. Pat dry with gauze or a Kleenex tissue  Before applying antibiotic ointment and a Band-Aid.   You need to report immediately  if  any signs of infection develop.

## 2010-10-31 ENCOUNTER — Encounter: Payer: Self-pay | Admitting: Internal Medicine

## 2010-11-04 ENCOUNTER — Encounter: Payer: Self-pay | Admitting: Internal Medicine

## 2010-11-07 ENCOUNTER — Other Ambulatory Visit: Payer: Self-pay | Admitting: Oncology

## 2010-11-07 ENCOUNTER — Encounter (HOSPITAL_BASED_OUTPATIENT_CLINIC_OR_DEPARTMENT_OTHER): Payer: BC Managed Care – PPO | Admitting: Oncology

## 2010-11-07 DIAGNOSIS — C50419 Malignant neoplasm of upper-outer quadrant of unspecified female breast: Secondary | ICD-10-CM

## 2010-11-07 DIAGNOSIS — Z170 Estrogen receptor positive status [ER+]: Secondary | ICD-10-CM

## 2010-11-07 DIAGNOSIS — F411 Generalized anxiety disorder: Secondary | ICD-10-CM

## 2010-11-07 LAB — CBC WITH DIFFERENTIAL/PLATELET
BASO%: 0.3 % (ref 0.0–2.0)
Basophils Absolute: 0 10*3/uL (ref 0.0–0.1)
Eosinophils Absolute: 0.1 10*3/uL (ref 0.0–0.5)
HCT: 32.5 % — ABNORMAL LOW (ref 34.8–46.6)
HGB: 10.7 g/dL — ABNORMAL LOW (ref 11.6–15.9)
LYMPH%: 28.1 % (ref 14.0–49.7)
MCHC: 32.9 g/dL (ref 31.5–36.0)
MONO#: 0.4 10*3/uL (ref 0.1–0.9)
NEUT%: 65.1 % (ref 38.4–76.8)
Platelets: 190 10*3/uL (ref 145–400)
WBC: 6.8 10*3/uL (ref 3.9–10.3)

## 2010-11-07 LAB — COMPREHENSIVE METABOLIC PANEL
BUN: 9 mg/dL (ref 6–23)
CO2: 21 mEq/L (ref 19–32)
Calcium: 8.7 mg/dL (ref 8.4–10.5)
Chloride: 106 mEq/L (ref 96–112)
Creatinine, Ser: 0.78 mg/dL (ref 0.50–1.10)
Glucose, Bld: 265 mg/dL — ABNORMAL HIGH (ref 70–99)
Total Bilirubin: 0.3 mg/dL (ref 0.3–1.2)

## 2010-12-04 ENCOUNTER — Other Ambulatory Visit (INDEPENDENT_AMBULATORY_CARE_PROVIDER_SITE_OTHER): Payer: BC Managed Care – PPO

## 2010-12-04 ENCOUNTER — Ambulatory Visit (INDEPENDENT_AMBULATORY_CARE_PROVIDER_SITE_OTHER): Payer: BC Managed Care – PPO | Admitting: Internal Medicine

## 2010-12-04 ENCOUNTER — Encounter: Payer: Self-pay | Admitting: Internal Medicine

## 2010-12-04 VITALS — BP 110/78 | HR 80 | Temp 98.9°F | Resp 16 | Wt 124.0 lb

## 2010-12-04 DIAGNOSIS — IMO0001 Reserved for inherently not codable concepts without codable children: Secondary | ICD-10-CM

## 2010-12-04 DIAGNOSIS — R5381 Other malaise: Secondary | ICD-10-CM

## 2010-12-04 DIAGNOSIS — F29 Unspecified psychosis not due to a substance or known physiological condition: Secondary | ICD-10-CM

## 2010-12-04 DIAGNOSIS — E109 Type 1 diabetes mellitus without complications: Secondary | ICD-10-CM

## 2010-12-04 DIAGNOSIS — R51 Headache: Secondary | ICD-10-CM

## 2010-12-04 DIAGNOSIS — F411 Generalized anxiety disorder: Secondary | ICD-10-CM

## 2010-12-04 LAB — CBC WITH DIFFERENTIAL/PLATELET
Basophils Absolute: 0 10*3/uL (ref 0.0–0.1)
Eosinophils Relative: 1.5 % (ref 0.0–5.0)
Monocytes Relative: 6.7 % (ref 3.0–12.0)
Neutrophils Relative %: 62.5 % (ref 43.0–77.0)
Platelets: 212 10*3/uL (ref 150.0–400.0)
WBC: 5.3 10*3/uL (ref 4.5–10.5)

## 2010-12-04 LAB — COMPREHENSIVE METABOLIC PANEL
Albumin: 3.3 g/dL — ABNORMAL LOW (ref 3.5–5.2)
BUN: 11 mg/dL (ref 6–23)
CO2: 23 mEq/L (ref 19–32)
Calcium: 8.8 mg/dL (ref 8.4–10.5)
Chloride: 107 mEq/L (ref 96–112)
GFR: 97.67 mL/min (ref >60.00)
Glucose, Bld: 280 mg/dL — ABNORMAL HIGH (ref 70–99)
Potassium: 4.2 mEq/L (ref 3.5–5.1)

## 2010-12-04 MED ORDER — PROCHLORPERAZINE MALEATE 10 MG PO TABS: 10.0000 mg | ORAL_TABLET | Freq: Four times a day (QID) | ORAL | Status: DC | PRN

## 2010-12-04 MED ORDER — OMEPRAZOLE 40 MG PO CPDR: 40.0000 mg | DELAYED_RELEASE_CAPSULE | Freq: Two times a day (BID) | ORAL | Status: DC

## 2010-12-04 MED ORDER — OXYCODONE HCL 15 MG PO TABS: 15.0000 mg | ORAL_TABLET | ORAL | Status: DC

## 2010-12-04 MED ORDER — TOPIRAMATE 200 MG PO TABS: 200.0000 mg | ORAL_TABLET | Freq: Two times a day (BID) | ORAL | Status: DC

## 2010-12-04 MED ORDER — OXYCODONE HCL 20 MG PO TB12: 20.0000 mg | ORAL_TABLET | Freq: Two times a day (BID) | ORAL | Status: DC

## 2010-12-04 NOTE — Assessment & Plan Note (Signed)
CFS - Multifactorial 

## 2010-12-04 NOTE — Assessment & Plan Note (Signed)
Chronic pain CFS Chronic migraines Depression Chronic stress Opioid dependant  Potential benefits of a long term opioids use as well as potential risks (i.e. addiction risk, apnea etc) and complications (i.e. Somnolence, constipation and others) were explained to the patient and were aknowledged.

## 2010-12-04 NOTE — Assessment & Plan Note (Signed)
  Chronic Risks associated with diet and treatment noncompliance were discussed. Compliance was encouraged.

## 2010-12-04 NOTE — Assessment & Plan Note (Signed)
Due to med interaction - resolved

## 2010-12-04 NOTE — Assessment & Plan Note (Signed)
Chronic migraines. Chronic pain syndrome Continue with current prescription therapy as reflected on the Med list.

## 2010-12-04 NOTE — Progress Notes (Signed)
  Subjective:    Patient ID: Lindsey Gomez, female    DOB: 1946-01-07, 65 y.o.   MRN: [id number]  HPI  The patient presents for a follow-up of  chronic hypertension, depression, HAs, chronic pain, type 1 diabetes controlled with medicines    Review of Systems  Constitutional: Positive for fatigue. Negative for chills, activity change, appetite change and unexpected weight change.  HENT: Negative for congestion, mouth sores and sinus pressure.   Eyes: Negative for visual disturbance.  Respiratory: Negative for cough and chest tightness.   Gastrointestinal: Negative for nausea and abdominal pain.  Genitourinary: Negative for frequency, difficulty urinating and vaginal pain.  Musculoskeletal: Positive for back pain and gait problem.  Skin: Negative for pallor and rash.  Neurological: Negative for dizziness, tremors, weakness, numbness and headaches.  Psychiatric/Behavioral: Positive for sleep disturbance and dysphoric mood. Negative for suicidal ideas and confusion. The patient is nervous/anxious.        Objective:   Physical Exam  Constitutional: She appears well-developed and well-nourished. No distress.  HENT:  Head: Normocephalic.  Right Ear: External ear normal.  Left Ear: External ear normal.  Nose: Nose normal.  Mouth/Throat: Oropharynx is clear and moist.  Eyes: Conjunctivae are normal. Pupils are equal, round, and reactive to light. Right eye exhibits no discharge. Left eye exhibits no discharge.  Neck: Normal range of motion. Neck supple. No JVD present. No tracheal deviation present. No thyromegaly present.  Cardiovascular: Normal rate, regular rhythm and normal heart sounds.   Pulmonary/Chest: No stridor. No respiratory distress. She has no wheezes.  Abdominal: Soft. Bowel sounds are normal. She exhibits no distension and no mass. There is no tenderness. There is no rebound and no guarding.  Musculoskeletal: She exhibits no edema and no tenderness.  Lymphadenopathy:   She has no cervical adenopathy.  Neurological: She displays normal reflexes. No cranial nerve deficit. She exhibits normal muscle tone. Coordination normal.  Skin: No rash noted. No erythema.  Psychiatric: Her behavior is normal. Judgment and thought content normal.       tearful          Assessment & Plan:

## 2010-12-04 NOTE — Assessment & Plan Note (Signed)
Chronic  Potential benefits of a long term benzodiazepines  use as well as potential risks  and complications were explained to the patient and were aknowledged. Continue with current prescription therapy as reflected on the Med list.  

## 2010-12-05 LAB — HEMOGLOBIN A1C: Hgb A1c MFr Bld: 8.8 % — ABNORMAL HIGH (ref 4.6–6.5)

## 2010-12-06 ENCOUNTER — Telehealth: Payer: Self-pay | Admitting: *Deleted

## 2010-12-06 NOTE — Telephone Encounter (Signed)
Omeprazole bid dosing requires PA. Faxed in completed PA form today.

## 2010-12-15 ENCOUNTER — Telehealth: Payer: Self-pay | Admitting: *Deleted

## 2010-12-15 NOTE — Telephone Encounter (Signed)
Rec pt summary of benefits for Prolia. Her OOP is $0. She is due 12-22-10... Pt informed and transferred to scheduler to make nurse visit appt.

## 2010-12-19 ENCOUNTER — Other Ambulatory Visit: Payer: Self-pay | Admitting: Internal Medicine

## 2010-12-21 NOTE — Telephone Encounter (Signed)
PA for omeprazole approved from 11-15-10 until 12-06-11. Pharmacy informed.

## 2010-12-22 ENCOUNTER — Ambulatory Visit (INDEPENDENT_AMBULATORY_CARE_PROVIDER_SITE_OTHER): Payer: BC Managed Care – PPO | Admitting: *Deleted

## 2010-12-22 DIAGNOSIS — M810 Age-related osteoporosis without current pathological fracture: Secondary | ICD-10-CM

## 2010-12-22 MED ORDER — DENOSUMAB 60 MG/ML ~~LOC~~ SOLN
60.0000 mg | Freq: Once | SUBCUTANEOUS | Status: AC
  Administered 2010-12-22: 60 mg via SUBCUTANEOUS

## 2011-01-02 ENCOUNTER — Telehealth: Payer: Self-pay

## 2011-01-02 MED ORDER — OXYCODONE HCL 20 MG PO TB12: 20.0000 mg | ORAL_TABLET | Freq: Two times a day (BID) | ORAL | Status: DC

## 2011-01-02 MED ORDER — OXYCODONE HCL 15 MG PO TABS: 15.0000 mg | ORAL_TABLET | ORAL | Status: DC

## 2011-01-02 NOTE — Telephone Encounter (Signed)
OK 1 mo on oxycod and oxycont Thx

## 2011-01-02 NOTE — Telephone Encounter (Signed)
Returned call to patient to advise rx ready to pick up/ told pt not home. Will try again later and rx placed upfront.

## 2011-01-02 NOTE — Telephone Encounter (Signed)
Patient called triage [facility] requesting refills on pain med. Per patient, she will run out before she is able to receive an appt

## 2011-01-02 NOTE — Telephone Encounter (Signed)
Rxs printed/pending MD sig.  

## 2011-01-03 ENCOUNTER — Ambulatory Visit: Payer: BC Managed Care – PPO | Admitting: Internal Medicine

## 2011-01-03 NOTE — Telephone Encounter (Signed)
Pt informed Rx ready for p/u.

## 2011-01-08 ENCOUNTER — Other Ambulatory Visit: Payer: Self-pay | Admitting: Internal Medicine

## 2011-01-15 ENCOUNTER — Encounter: Payer: Self-pay | Admitting: Internal Medicine

## 2011-01-15 ENCOUNTER — Ambulatory Visit (INDEPENDENT_AMBULATORY_CARE_PROVIDER_SITE_OTHER): Payer: BC Managed Care – PPO | Admitting: Internal Medicine

## 2011-01-15 VITALS — BP 122/86 | HR 88 | Temp 98.3°F | Resp 16 | Wt 123.0 lb

## 2011-01-15 DIAGNOSIS — R5381 Other malaise: Secondary | ICD-10-CM

## 2011-01-15 DIAGNOSIS — E109 Type 1 diabetes mellitus without complications: Secondary | ICD-10-CM

## 2011-01-15 DIAGNOSIS — R51 Headache: Secondary | ICD-10-CM

## 2011-01-15 DIAGNOSIS — G43819 Other migraine, intractable, without status migrainosus: Secondary | ICD-10-CM

## 2011-01-15 MED ORDER — OXYCODONE HCL 15 MG PO TABS: 15.0000 mg | ORAL_TABLET | ORAL | Status: DC

## 2011-01-15 MED ORDER — OXYCODONE HCL 20 MG PO TB12: 20.0000 mg | ORAL_TABLET | Freq: Two times a day (BID) | ORAL | Status: DC

## 2011-01-15 MED ORDER — CITALOPRAM HYDROBROMIDE 20 MG PO TABS: 40.0000 mg | ORAL_TABLET | Freq: Every day | ORAL | Status: DC

## 2011-01-15 NOTE — Assessment & Plan Note (Signed)
Continue with current prescription therapy as reflected on the Med list.  

## 2011-01-15 NOTE — Assessment & Plan Note (Signed)
Chronic migraines. Chronic pain syndrome  Potential benefits of a long term opioids use as well as potential risks (i.e. addiction risk, apnea etc) and complications (i.e. Somnolence, constipation and others) were explained to the patient and were aknowledged. Continue with current prescription therapy as reflected on the Med list.

## 2011-01-15 NOTE — Progress Notes (Signed)
  Subjective:    Patient ID: Lindsey Gomez, female    DOB: [birth year], 65 y.o.   MRN: [id number]  HPI   The patient presents for a follow-up of  Chronic HAs, depression, anxiety, hypertension, chronic dyslipidemia, type 1 diabetes controlled with medicines   Review of Systems  Constitutional: Positive for fatigue. Negative for chills, activity change, appetite change and unexpected weight change.  HENT: Negative for congestion, mouth sores and sinus pressure.   Eyes: Negative for visual disturbance.  Respiratory: Negative for cough and chest tightness.   Gastrointestinal: Negative for nausea and abdominal pain.  Genitourinary: Negative for frequency, difficulty urinating and vaginal pain.  Musculoskeletal: Positive for arthralgias and gait problem. Negative for back pain.  Skin: Negative for pallor and rash.  Neurological: Positive for headaches. Negative for dizziness, tremors, weakness and numbness.  Psychiatric/Behavioral: Negative for suicidal ideas, confusion, sleep disturbance and self-injury. The patient is nervous/anxious.        Objective:   Physical Exam  Constitutional: She appears well-developed and well-nourished. No distress.  HENT:  Head: Normocephalic.  Right Ear: External ear normal.  Left Ear: External ear normal.  Nose: Nose normal.  Mouth/Throat: Oropharynx is clear and moist.  Eyes: Conjunctivae are normal. Pupils are equal, round, and reactive to light. Right eye exhibits no discharge. Left eye exhibits no discharge.  Neck: Normal range of motion. Neck supple. No JVD present. No tracheal deviation present. No thyromegaly present.  Cardiovascular: Normal rate, regular rhythm and normal heart sounds.   Pulmonary/Chest: No stridor. No respiratory distress. She has no wheezes.  Abdominal: Soft. Bowel sounds are normal. She exhibits no distension and no mass. There is no tenderness. There is no rebound and no guarding.  Musculoskeletal: She exhibits no edema and  no tenderness.  Lymphadenopathy:    She has no cervical adenopathy.  Neurological: She displays normal reflexes. No cranial nerve deficit. She exhibits normal muscle tone. Coordination normal.  Skin: Skin is warm. No rash noted. No erythema.  Psychiatric: Her behavior is normal. Judgment and thought content normal.       Depressed and tearful     Lab Results  Component Value Date   WBC 5.3 12/04/2010   HGB 10.6* 12/04/2010   HCT 32.7* 12/04/2010   PLT 212.0 12/04/2010   GLUCOSE 280* 12/04/2010   CHOL 226* 06/15/2010   TRIG 213.0* 06/15/2010   HDL 62.60 06/15/2010   LDLDIRECT 131.9 06/15/2010   LDLCALC 99 05/20/2006   ALT 15 12/04/2010   AST 20 12/04/2010   NA 138 12/04/2010   K 4.2 12/04/2010   CL 107 12/04/2010   CREATININE 0.7 12/04/2010   BUN 11 12/04/2010   CO2 23 12/04/2010   TSH 1.32 09/01/2010   INR 0.9 09/01/2010   HGBA1C 8.8* 12/04/2010   MICROALBUR 0.9 03/22/2008       Assessment & Plan:

## 2011-02-07 ENCOUNTER — Other Ambulatory Visit: Payer: Self-pay | Admitting: Internal Medicine

## 2011-03-20 ENCOUNTER — Other Ambulatory Visit: Payer: Self-pay | Admitting: Internal Medicine

## 2011-03-23 ENCOUNTER — Encounter: Payer: Self-pay | Admitting: Endocrinology

## 2011-03-23 ENCOUNTER — Ambulatory Visit (INDEPENDENT_AMBULATORY_CARE_PROVIDER_SITE_OTHER)
Admission: RE | Admit: 2011-03-23 | Discharge: 2011-03-23 | Disposition: A | Discharge status: Home / Self Care | Payer: BC Managed Care – PPO | Source: Ambulatory Visit | Attending: Endocrinology | Admitting: Endocrinology

## 2011-03-23 ENCOUNTER — Ambulatory Visit (INDEPENDENT_AMBULATORY_CARE_PROVIDER_SITE_OTHER): Payer: BC Managed Care – PPO | Admitting: Endocrinology

## 2011-03-23 VITALS — BP 124/72 | HR 83 | Temp 98.1°F | Ht 62.5 in | Wt 125.4 lb

## 2011-03-23 DIAGNOSIS — S20219A Contusion of unspecified front wall of thorax, initial encounter: Secondary | ICD-10-CM

## 2011-03-23 DIAGNOSIS — E109 Type 1 diabetes mellitus without complications: Secondary | ICD-10-CM

## 2011-03-23 MED ORDER — OXYCODONE HCL 15 MG PO TABS: 15.0000 mg | ORAL_TABLET | ORAL | Status: DC

## 2011-03-23 NOTE — Progress Notes (Signed)
Subjective:    Patient ID: Lindsey Gomez, female    DOB: 06/08/1945, 66 y.o.   MRN: [id number]  HPI Almost 2 mos ago, pt slipped and fell in her barn, striking her right flank.  She has moderte pain there since then, which is improving slowly.  No assoc loc.  Since the injury, she averages 3 oxy-xr per day.  Pt has h/o insulin-requiring DM (1990).  It was dx'ed after a 40% pancreatectomy at [facility], for pancreatitis.    Past Medical History  Diagnosis Date  . Depression   . Type II or unspecified type diabetes mellitus without mention of complication, not stated as uncontrolled   . HTN (hypertension)   . Chronic pain   . Fibromyalgia   . Migraines   . History of breast cancer     Right  . Chronic pancreatitis   . Vitamin D deficiency   . GERD (gastroesophageal reflux disease)   . Finger dislocation 2009    L 3rd  . COPD (chronic obstructive pulmonary disease)   . Anemia   . Fractured sternum 11/2008  . Osteoporosis     Reclast too expensive  . Barrett's esophagus   . Chronic fatigue   . Diverticulosis     Past Surgical History  Procedure Date  . Nasal sinus surgery   . Pancreatectomy     40%  . Cholecystectomy   . Appendectomy   . Abdominal hysterectomy     History   Social History  . Marital Status: Married    Spouse Name: [name]    Number of Children: N/A  . Years of Education: N/A   Occupational History  . 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 Smoking status: Current Everyday Smoker -- 1.0 packs/day  . Smokeless tobacco: Never Used  . Alcohol Use: No  . Drug Use: No  . Sexually Active: No   Other Topics Concern  . Not on file   Social History Narrative   Did not go back to work Oct 23, [name] is very ill - unable to have financial ends meet, no incomeRegular Exercise -  NOCoffee daily     Current Outpatient Prescriptions on File Prior to Visit  Medication Sig Dispense Refill  . ACCU-CHEK AVIVA PLUS test strip CHECK BLOOD  SUGAR 4 TIMES A DAY.  100 each  3  . ACCU-CHEK SOFTCLIX LANCETS lancets as directed. Use as instructed       . amitriptyline (ELAVIL) 50 MG tablet Take 50-100 mg by mouth at bedtime.        [name] amLODipine-benazepril (LOTREL) 5-10 MG per capsule Take 1 capsule by mouth daily.  30 capsule  11  . atenolol (TENORMIN) 100 MG tablet Take 1 tablet (100 mg total) by mouth every morning.  30 tablet  11  . atorvastatin (LIPITOR) 10 MG tablet Take 1 tablet (10 mg total) by mouth daily.  30 tablet  11  . calcitRIOL (ROCALTROL) 0.5 MCG capsule Take 1 capsule (0.5 mcg total) by mouth 2 (two) times daily.  60 capsule  11  . citalopram (CELEXA) 20 MG tablet Take 2 tablets (40 mg total) by mouth daily.  60 tablet  11  . diclofenac sodium (VOLTAREN) 1 % GEL Apply topically 4 (four) times daily as needed.        . eletriptan (RELPAX) 40 MG tablet One tablet by mouth as needed for migraine headache.  If the headache improves and  then returns, dose may be repeated after 2 hours have elapsed since first dose (do not exceed 80 mg per day). For migraine       . hydrocortisone (CORTEF) 10 MG tablet Take 1 tablet (10 mg total) by mouth 2 (two) times daily. Morning and at lunch  60 tablet  11  . insulin detemir (LEVEMIR) 100 UNIT/ML injection Inject 15 Units into the skin 2 (two) times daily.      . insulin glulisine (APIDRA) 100 UNIT/ML injection Inject 7 Units into the skin 3 (three) times daily before meals.      . Ketorolac Tromethamine (SPRIX) 15.75 MG/SPRAY SOLN 1 spray by Nasal route 3 (three) times daily as needed.        [name] LORazepam (ATIVAN) 2 MG tablet Take 1 tablet (2 mg total) by mouth 3 (three) times daily as needed.  90 tablet  5  . NASONEX 50 MCG/ACT nasal spray USE 1 OR 2 SPRAYS IN EACH NOSTRIL ONCE DAILY.  1 Inhaler  2  . omeprazole (PRILOSEC) 40 MG capsule Take 1 capsule (40 mg total) by mouth 2 (two) times daily.  60 capsule  11  . prochlorperazine (COMPAZINE) 10 MG tablet Take 1 tablet (10 mg total) by mouth  4 (four) times daily as needed. For nausea  30 tablet  3  . ranitidine (ZANTAC) 300 MG tablet TAKE 1 TABLET ONCE DAILY.  30 tablet  11  . tamoxifen (NOLVADEX) 20 MG tablet Take 20 mg by mouth 2 (two) times daily.        [name] topiramate (TOPAMAX) 200 MG tablet Take 1 tablet (200 mg total) by mouth 2 (two) times daily.  60 tablet  5  . triamcinolone (KENALOG) 0.1 % ointment Apply topically 2 (two) times daily as needed.        . zolmitriptan (ZOMIG) 5 MG tablet Take 5 mg by mouth as needed.        . Insulin Pen Needle (B-D ULTRAFINE III SHORT PEN) 31G X 8 MM MISC Inject 1 each into the skin as directed.  100 each  11  . oxyCODONE (OXYCONTIN) 20 MG 12 hr tablet Take 1 tablet (20 mg total) by mouth every 12 (twelve) hours. Please fill on or after  02/03/11  60 tablet  0   Current Facility-Administered Medications on File Prior to Visit  Medication Dose Route Frequency Provider Last Rate Last Dose  . 0.9 %  sodium chloride infusion  500 mL Intravenous Continuous Louis Meckel, MD        Allergies  Allergen Reactions  . Milnacipran     REACTION: HA and hand swelling  . Moxifloxacin   . Rizatriptan Benzoate     REACTION: n/v  . Sulfonamide Derivatives   . Venlafaxine     Family History  Problem Relation Age of Onset  . Colon cancer Neg Hx   . COPD Father   . Heart disease Father   . Leukemia Mother   . Diabetes Maternal Uncle    BP 124/72  Pulse 83  Temp(Src) 98.1 F (36.7 C) (Oral)  Ht 5' 2.5" (1.588 m)  Wt 125 lb 6.4 oz (56.881 kg)  BMI 22.57 kg/m2  SpO2 97%   Review of Systems Denies weight change and sob.    Objective:   Physical Exam VITAL SIGNS:  See vs page GENERAL: no distress Right chest wall:  There is slight ecchymosis and tenderness. LUNGS:  Clear to auscultation.     Cxr: nad  Assessment & Plan:  DM.  Based on the pattern of her cbg's, she needs some adjustment in her therapy Chest contusion, new.

## 2011-03-23 NOTE — Patient Instructions (Addendum)
A chest-x-ray is being requested for you today.  please call [phone] to hear your test results.  You will be prompted to enter the 9-digit "MRN" number that appears at the top left of this page, followed by #.  Then you will hear the message. here is a refill of the immediate release oxycodone.  For now, take 3-4 per day, as needed for pain.   For now, take (no matter what your blood sugar is): levemir 15 units 2x a day, and: apidra 7 uits 3x a day (just before each meal). check your blood sugar 4 times a day--before the 3 meals, and at bedtime.  also check if you have symptoms of your blood sugar being too high or too low.  please keep a record of the readings and bring it to your next appointment here.  please call us sooner if your blood sugar goes below 70, or if it stays over 200. Please come back here next week, the same day as you see dr plotnikov. (update: i left message on phone-tree:  rx as we discussed)

## 2011-03-30 ENCOUNTER — Ambulatory Visit: Payer: BC Managed Care – PPO | Admitting: Internal Medicine

## 2011-03-30 ENCOUNTER — Encounter: Payer: Self-pay | Admitting: Internal Medicine

## 2011-03-30 ENCOUNTER — Encounter: Payer: Self-pay | Admitting: Endocrinology

## 2011-03-30 ENCOUNTER — Ambulatory Visit (INDEPENDENT_AMBULATORY_CARE_PROVIDER_SITE_OTHER): Payer: BC Managed Care – PPO | Admitting: Internal Medicine

## 2011-03-30 ENCOUNTER — Ambulatory Visit (INDEPENDENT_AMBULATORY_CARE_PROVIDER_SITE_OTHER): Payer: BC Managed Care – PPO | Admitting: Endocrinology

## 2011-03-30 VITALS — BP 124/82 | HR 70 | Temp 97.9°F | Wt 128.0 lb

## 2011-03-30 DIAGNOSIS — E559 Vitamin D deficiency, unspecified: Secondary | ICD-10-CM

## 2011-03-30 DIAGNOSIS — R296 Repeated falls: Secondary | ICD-10-CM

## 2011-03-30 DIAGNOSIS — R110 Nausea: Secondary | ICD-10-CM

## 2011-03-30 DIAGNOSIS — R071 Chest pain on breathing: Secondary | ICD-10-CM

## 2011-03-30 DIAGNOSIS — E109 Type 1 diabetes mellitus without complications: Secondary | ICD-10-CM

## 2011-03-30 DIAGNOSIS — Z9181 History of falling: Secondary | ICD-10-CM

## 2011-03-30 DIAGNOSIS — F411 Generalized anxiety disorder: Secondary | ICD-10-CM

## 2011-03-30 DIAGNOSIS — IMO0001 Reserved for inherently not codable concepts without codable children: Secondary | ICD-10-CM

## 2011-03-30 MED ORDER — AMITRIPTYLINE HCL 50 MG PO TABS: 50.0000 mg | ORAL_TABLET | Freq: Every day | ORAL | Status: DC

## 2011-03-30 MED ORDER — PROCHLORPERAZINE MALEATE 10 MG PO TABS: 10.0000 mg | ORAL_TABLET | Freq: Four times a day (QID) | ORAL | Status: DC | PRN

## 2011-03-30 MED ORDER — LORAZEPAM 2 MG PO TABS: 2.0000 mg | ORAL_TABLET | Freq: Three times a day (TID) | ORAL | Status: DC | PRN

## 2011-03-30 MED ORDER — OXYCODONE HCL 20 MG PO TB12: 20.0000 mg | ORAL_TABLET | Freq: Two times a day (BID) | ORAL | Status: DC

## 2011-03-30 MED ORDER — CLOTRIMAZOLE-BETAMETHASONE 1-0.05 % EX CREA: TOPICAL_CREAM | Freq: Two times a day (BID) | CUTANEOUS | Status: AC

## 2011-03-30 NOTE — Assessment & Plan Note (Signed)
Continue with current prescription therapy as reflected on the Med list.  

## 2011-03-30 NOTE — Patient Instructions (Addendum)
Reduce levemir to 12 units 2x a day, and: Increase apidra to 9 units 3x a day (just before each meal). check your blood sugar 4 times a day--before the 3 meals, and at bedtime.  also check if you have symptoms of your blood sugar being too high or too low.  please keep a record of the readings and bring it to your next appointment here.  please call us sooner if your blood sugar goes below 70, or if it stays over 200. Please come back for a follow-up appointment for 1 month.  Please make an appointment.

## 2011-03-30 NOTE — Progress Notes (Signed)
Patient ID: Lindsey Gomez, female   DOB: October 14, 1945, 66 y.o.   MRN: [id number]  Subjective:    Patient ID: Lindsey Gomez, female    DOB: 1945/10/24, 66 y.o.   MRN: [id number]  HPI   The patient presents for a follow-up of  Chronic HAs, depression, anxiety, hypertension, chronic dyslipidemia, type 1 diabetes controlled with medicines. C/o R CP - better now on 4 oxycod/d - she saw Dr Everardo All   Review of Systems  Constitutional: Positive for fatigue. Negative for chills, activity change, appetite change and unexpected weight change.  HENT: Negative for congestion, mouth sores and sinus pressure.   Eyes: Negative for visual disturbance.  Respiratory: Negative for cough and chest tightness.   Gastrointestinal: Negative for nausea and abdominal pain.  Genitourinary: Negative for frequency, difficulty urinating and vaginal pain.  Musculoskeletal: Positive for arthralgias and gait problem. Negative for back pain.  Skin: Negative for pallor and rash.  Neurological: Positive for headaches. Negative for dizziness, tremors, weakness and numbness.  Psychiatric/Behavioral: Negative for suicidal ideas, confusion, sleep disturbance and self-injury. The patient is nervous/anxious.        Objective:   Physical Exam  Constitutional: She appears well-developed and well-nourished. No distress.  HENT:  Head: Normocephalic.  Right Ear: External ear normal.  Left Ear: External ear normal.  Nose: Nose normal.  Mouth/Throat: Oropharynx is clear and moist.  Eyes: Conjunctivae are normal. Pupils are equal, round, and reactive to light. Right eye exhibits no discharge. Left eye exhibits no discharge.  Neck: Normal range of motion. Neck supple. No JVD present. No tracheal deviation present. No thyromegaly present.  Cardiovascular: Normal rate, regular rhythm and normal heart sounds.   Pulmonary/Chest: No stridor. No respiratory distress. She has no wheezes.  Abdominal: Soft. Bowel sounds are normal. She  exhibits no distension and no mass. There is no tenderness. There is no rebound and no guarding.  Musculoskeletal: She exhibits no edema and no tenderness.  Lymphadenopathy:    She has no cervical adenopathy.  Neurological: She displays normal reflexes. No cranial nerve deficit. She exhibits normal muscle tone. Coordination normal.  Skin: Skin is warm. No rash noted. No erythema.  Psychiatric: Her behavior is normal. Judgment and thought content normal.       Depressed and tearful     Lab Results  Component Value Date   WBC 5.3 12/04/2010   HGB 10.6* 12/04/2010   HCT 32.7* 12/04/2010   PLT 212.0 12/04/2010   GLUCOSE 280* 12/04/2010   CHOL 226* 06/15/2010   TRIG 213.0* 06/15/2010   HDL 62.60 06/15/2010   LDLDIRECT 131.9 06/15/2010   LDLCALC 99 05/20/2006   ALT 15 12/04/2010   AST 20 12/04/2010   NA 138 12/04/2010   K 4.2 12/04/2010   CL 107 12/04/2010   CREATININE 0.7 12/04/2010   BUN 11 12/04/2010   CO2 23 12/04/2010   TSH 1.32 09/01/2010   INR 0.9 09/01/2010   HGBA1C 8.8* 12/04/2010   MICROALBUR 0.9 03/22/2008       Assessment & Plan:

## 2011-03-30 NOTE — Progress Notes (Signed)
Subjective:    Patient ID: DANINE HOR, female    DOB: 1945/09/29, 65 y.o.   MRN: [id number]  HPI Pt has h/o insulin-requiring DM (1990).  It was dx'ed after a 40% pancreatectomy at [facility], for pancreatitis. she brings a record of her cbg's which i have reviewed today.  It varies from 55-400, but most are 100-200.  It is in general higher as the day goes on, but not necessarily so.  Chest-wall pain is improved.   Past Medical History  Diagnosis Date  . Depression   . Type II or unspecified type diabetes mellitus without mention of complication, not stated as uncontrolled   . HTN (hypertension)   . Chronic pain   . Fibromyalgia   . Migraines   . History of breast cancer     Right  . Chronic pancreatitis   . Vitamin D deficiency   . GERD (gastroesophageal reflux disease)   . Finger dislocation 2009    L 3rd  . COPD (chronic obstructive pulmonary disease)   . Anemia   . Fractured sternum 11/2008  . Osteoporosis     Reclast too expensive  . Barrett's esophagus   . Chronic fatigue   . Diverticulosis     Past Surgical History  Procedure Date  . Nasal sinus surgery   . Pancreatectomy     40%  . Cholecystectomy   . Appendectomy   . Abdominal hysterectomy     History   Social History  . Marital Status: Married    Spouse Name: [name]    Number of Children: N/A  . Years of Education: N/A   Occupational History  . 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 Smoking status: Current Everyday Smoker -- 1.0 packs/day  . Smokeless tobacco: Never Used  . Alcohol Use: No  . Drug Use: No  . Sexually Active: No   Other Topics Concern  . Not on file   Social History Narrative   Did not go back to work Oct 23, [name] is very ill - unable to have financial ends meet, no incomeRegular Exercise -  NOCoffee daily     Current Outpatient Prescriptions on File Prior to Visit  Medication Sig Dispense Refill  . ACCU-CHEK AVIVA PLUS test strip CHECK BLOOD SUGAR 4  TIMES A DAY.  100 each  3  . ACCU-CHEK SOFTCLIX LANCETS lancets as directed. Use as instructed       . amitriptyline (ELAVIL) 50 MG tablet Take 50-100 mg by mouth at bedtime.        [name] amLODipine-benazepril (LOTREL) 5-10 MG per capsule Take 1 capsule by mouth daily.  30 capsule  11  . atenolol (TENORMIN) 100 MG tablet Take 1 tablet (100 mg total) by mouth every morning.  30 tablet  11  . atorvastatin (LIPITOR) 10 MG tablet Take 1 tablet (10 mg total) by mouth daily.  30 tablet  11  . calcitRIOL (ROCALTROL) 0.5 MCG capsule Take 1 capsule (0.5 mcg total) by mouth 2 (two) times daily.  60 capsule  11  . citalopram (CELEXA) 20 MG tablet Take 2 tablets (40 mg total) by mouth daily.  60 tablet  11  . diclofenac sodium (VOLTAREN) 1 % GEL Apply topically 4 (four) times daily as needed.        . eletriptan (RELPAX) 40 MG tablet One tablet by mouth as needed for migraine headache.  If the headache improves and then returns,  dose may be repeated after 2 hours have elapsed since first dose (do not exceed 80 mg per day). For migraine       . hydrocortisone (CORTEF) 10 MG tablet Take 1 tablet (10 mg total) by mouth 2 (two) times daily. Morning and at lunch  60 tablet  11  . insulin detemir (LEVEMIR) 100 UNIT/ML injection Inject 12 Units into the skin 2 (two) times daily.       . insulin glulisine (APIDRA) 100 UNIT/ML injection Inject 9 Units into the skin 3 (three) times daily before meals.       . Insulin Pen Needle (B-D ULTRAFINE III SHORT PEN) 31G X 8 MM MISC Inject 1 each into the skin as directed.  100 each  11  . Ketorolac Tromethamine (SPRIX) 15.75 MG/SPRAY SOLN 1 spray by Nasal route 3 (three) times daily as needed.        [name] LORazepam (ATIVAN) 2 MG tablet Take 1 tablet (2 mg total) by mouth 3 (three) times daily as needed.  90 tablet  5  . NASONEX 50 MCG/ACT nasal spray USE 1 OR 2 SPRAYS IN EACH NOSTRIL ONCE DAILY.  1 Inhaler  2  . omeprazole (PRILOSEC) 40 MG capsule Take 1 capsule (40 mg total) by mouth 2  (two) times daily.  60 capsule  11  . oxyCODONE (OXYCONTIN) 20 MG 12 hr tablet Take 1 tablet (20 mg total) by mouth every 12 (twelve) hours. Please fill on or after  02/03/11  60 tablet  0  . oxyCODONE (ROXICODONE) 15 MG immediate release tablet Take 15 mg by mouth as directed. 2 to 3 times daily as needed for pain      . oxyCODONE (ROXICODONE) 15 MG immediate release tablet Take 1 tablet (15 mg total) by mouth as directed. 2 to 3 times daily as needed for pain  90 tablet  0  . prochlorperazine (COMPAZINE) 10 MG tablet Take 1 tablet (10 mg total) by mouth 4 (four) times daily as needed. For nausea  30 tablet  3  . ranitidine (ZANTAC) 300 MG tablet TAKE 1 TABLET ONCE DAILY.  30 tablet  11  . tamoxifen (NOLVADEX) 20 MG tablet Take 20 mg by mouth 2 (two) times daily.        [name] topiramate (TOPAMAX) 200 MG tablet Take 1 tablet (200 mg total) by mouth 2 (two) times daily.  60 tablet  5  . triamcinolone (KENALOG) 0.1 % ointment Apply topically 2 (two) times daily as needed.        . zolmitriptan (ZOMIG) 5 MG tablet Take 5 mg by mouth as needed.         Current Facility-Administered Medications on File Prior to Visit  Medication Dose Route Frequency Provider Last Rate Last Dose  . 0.9 %  sodium chloride infusion  500 mL Intravenous Continuous Louis Meckel, MD        Allergies  Allergen Reactions  . Milnacipran     REACTION: HA and hand swelling  . Moxifloxacin   . Rizatriptan Benzoate     REACTION: n/v  . Sulfonamide Derivatives   . Venlafaxine     Family History  Problem Relation Age of Onset  . Colon cancer Neg Hx   . COPD Father   . Heart disease Father   . Leukemia Mother   . Diabetes Maternal Uncle     BP 112/70  Pulse 75  Temp(Src) 97.9 F (36.6 C) (Oral)  Ht 5' 2.5" (1.588 m)  Wt 128 lb 12.8 oz (58.423 kg)  BMI 23.18 kg/m2  SpO2 95%  Review of Systems Denies loc    Objective:   Physical Exam VITAL SIGNS:  See vs page GENERAL: no distress Pulses: dorsalis pedis  intact bilat.   Feet: no deformity.  no ulcer on the feet.  feet are of normal color and temp.  Trace bilat leg edema.  There re bilat varicosities.  Neuro: sensation is intact to touch on the feet    Assessment & Plan:  DM, Based on the pattern of her cbg's, she needs some adjustment in her therapy

## 2011-03-30 NOTE — Assessment & Plan Note (Signed)
1/13 (fell in 11/12 again) -contusion CXR 1/13 - no new fx Dr Everardo All increased her Oxycodone to qid; better

## 2011-03-30 NOTE — Assessment & Plan Note (Signed)
Continue with current prescription therapy as reflected on the Med list.  Chronic pain CFS Chronic migraines Depression Chronic stress Opioid dependant  Potential benefits of a long term opioids use as well as potential risks (i.e. addiction risk, apnea etc) and complications (i.e. Somnolence, constipation and others) were explained to the patient and were aknowledged.

## 2011-03-30 NOTE — Assessment & Plan Note (Addendum)
6/12 11/12 Multifactorial: manual farm work (horse), riding, meds side effects, debility, chronic ataxia - it is a difficult situation  Potential health risks of the above were explained to the patient and were aknowledged.

## 2011-04-16 ENCOUNTER — Telehealth: Payer: Self-pay | Admitting: Internal Medicine

## 2011-04-16 NOTE — Telephone Encounter (Signed)
The pt called and stated her cbg readings have been high lately.  She did not give specific times or dates, but stated there have been "several over 200 and several over 300".  Please advise if the pt needs an ov. Thanks!

## 2011-04-17 NOTE — Telephone Encounter (Signed)
Called pt, appt scheduled for Thursday.

## 2011-04-17 NOTE — Telephone Encounter (Signed)
Please move-up next ov to net available

## 2011-04-19 ENCOUNTER — Ambulatory Visit (INDEPENDENT_AMBULATORY_CARE_PROVIDER_SITE_OTHER): Payer: BC Managed Care – PPO | Admitting: Endocrinology

## 2011-04-19 ENCOUNTER — Encounter: Payer: Self-pay | Admitting: Endocrinology

## 2011-04-19 VITALS — BP 120/80 | HR 73 | Temp 97.8°F | Ht 62.0 in | Wt 128.4 lb

## 2011-04-19 DIAGNOSIS — E109 Type 1 diabetes mellitus without complications: Secondary | ICD-10-CM

## 2011-04-19 MED ORDER — GLUCOSE BLOOD VI STRP: 1.0000 | ORAL_STRIP | Freq: Four times a day (QID) | Status: DC

## 2011-04-19 NOTE — Progress Notes (Signed)
Subjective:    Patient ID: Lindsey Gomez, female    DOB: 01-Oct-1945, 66 y.o.   MRN: [id number]  HPI Pt has h/o insulin-requiring DM (1990).  It was dx'ed after a 40% pancreatectomy at [facility], for pancreatitis. she brings a record of her cbg's which i have reviewed today.  It varies from 55-400, but most are 100-200.  She has had several episodes of mild hypoglycemia in the afternoon, or before breakfast.  It is in general higher as the day goes on, but not necessarily so.  Past Medical History  Diagnosis Date  . Depression   . Type II or unspecified type diabetes mellitus without mention of complication, not stated as uncontrolled   . HTN (hypertension)   . Chronic pain   . Fibromyalgia   . Migraines   . History of breast cancer     Right  . Chronic pancreatitis   . Vitamin d deficiency   . GERD (gastroesophageal reflux disease)   . Finger dislocation 2009    L 3rd  . COPD (chronic obstructive pulmonary disease)   . Anemia   . Fractured sternum 11/2008  . Osteoporosis     Reclast too expensive  . Barrett's esophagus   . Chronic fatigue   . Diverticulosis     Past Surgical History  Procedure Date  . Nasal sinus surgery   . Pancreatectomy     40%  . Cholecystectomy   . Appendectomy   . Abdominal hysterectomy     History   Social History  . Marital Status: Married    Spouse Name: [name]    Number of Children: N/A  . Years of Education: N/A   Occupational History  . 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 Smoking status: Current Everyday Smoker -- 2.0 packs/day  . Smokeless tobacco: Never Used  . Alcohol Use: No  . Drug Use: No  . Sexually Active: No   Other Topics Concern  . Not on file   Social History Narrative   Did not go back to work Oct 23, [name] is very ill - unable to have financial ends meet, no incomeRegular Exercise -  NOCoffee daily     Current Outpatient Prescriptions on File Prior to Visit  Medication Sig Dispense  Refill  . ACCU-CHEK AVIVA PLUS test strip CHECK BLOOD SUGAR 4 TIMES A DAY.  100 each  3  . ACCU-CHEK SOFTCLIX LANCETS lancets as directed. Use as instructed       . amitriptyline (ELAVIL) 50 MG tablet Take 1-2 tablets (50-100 mg total) by mouth at bedtime.  60 tablet  5  . amLODipine-benazepril (LOTREL) 5-10 MG per capsule Take 1 capsule by mouth daily.  30 capsule  11  . atenolol (TENORMIN) 100 MG tablet Take 1 tablet (100 mg total) by mouth every morning.  30 tablet  11  . atorvastatin (LIPITOR) 10 MG tablet Take 1 tablet (10 mg total) by mouth daily.  30 tablet  11  . calcitRIOL (ROCALTROL) 0.5 MCG capsule Take 1 capsule (0.5 mcg total) by mouth 2 (two) times daily.  60 capsule  11  . citalopram (CELEXA) 20 MG tablet Take 2 tablets (40 mg total) by mouth daily.  60 tablet  11  . clotrimazole-betamethasone (LOTRISONE) cream Apply topically 2 (two) times daily.  135 g  3  . diclofenac sodium (VOLTAREN) 1 % GEL Apply topically 4 (four) times daily as needed.        [name]  eletriptan (RELPAX) 40 MG tablet One tablet by mouth as needed for migraine headache.  If the headache improves and then returns, dose may be repeated after 2 hours have elapsed since first dose (do not exceed 80 mg per day). For migraine       . hydrocortisone (CORTEF) 10 MG tablet Take 1 tablet (10 mg total) by mouth 2 (two) times daily. Morning and at lunch  60 tablet  11  . insulin detemir (LEVEMIR) 100 UNIT/ML injection Inject 12 Units into the skin 2 (two) times daily.       . insulin glulisine (APIDRA) 100 UNIT/ML injection Inject 9 Units into the skin 3 (three) times daily before meals.       . Insulin Pen Needle (B-D ULTRAFINE III SHORT PEN) 31G X 8 MM MISC Inject 1 each into the skin as directed.  100 each  11  . Ketorolac Tromethamine (SPRIX) 15.75 MG/SPRAY SOLN 1 spray by Nasal route 3 (three) times daily as needed.        [name] LORazepam (ATIVAN) 2 MG tablet Take 1 tablet (2 mg total) by mouth 3 (three) times daily as needed.   90 tablet  5  . NASONEX 50 MCG/ACT nasal spray USE 1 OR 2 SPRAYS IN EACH NOSTRIL ONCE DAILY.  1 Inhaler  2  . omeprazole (PRILOSEC) 40 MG capsule Take 1 capsule (40 mg total) by mouth 2 (two) times daily.  60 capsule  11  . oxyCODONE (OXYCONTIN) 20 MG 12 hr tablet Take 1 tablet (20 mg total) by mouth every 12 (twelve) hours. Please fill on or after  04/05/11  60 tablet  0  . oxyCODONE (ROXICODONE) 15 MG immediate release tablet Take 1 tablet (15 mg total) by mouth as directed. 2 to 3 times daily as needed for pain  90 tablet  0  . prochlorperazine (COMPAZINE) 10 MG tablet Take 1 tablet (10 mg total) by mouth 4 (four) times daily as needed. For nausea  30 tablet  3  . ranitidine (ZANTAC) 300 MG tablet TAKE 1 TABLET ONCE DAILY.  30 tablet  11  . tamoxifen (NOLVADEX) 20 MG tablet Take 20 mg by mouth 2 (two) times daily.        [name] topiramate (TOPAMAX) 200 MG tablet Take 1 tablet (200 mg total) by mouth 2 (two) times daily.  60 tablet  5  . triamcinolone (KENALOG) 0.1 % ointment Apply topically 2 (two) times daily as needed.        . zolmitriptan (ZOMIG) 5 MG tablet Take 5 mg by mouth as needed.         Current Facility-Administered Medications on File Prior to Visit  Medication Dose Route Frequency Provider Last Rate Last Dose  . 0.9 %  sodium chloride infusion  500 mL Intravenous Continuous Louis Meckel, MD        Allergies  Allergen Reactions  . Milnacipran     REACTION: HA and hand swelling  . Moxifloxacin   . Rizatriptan Benzoate     REACTION: n/v  . Sulfonamide Derivatives   . Venlafaxine     Family History  Problem Relation Age of Onset  . Colon cancer Neg Hx   . COPD Father   . Heart disease Father   . Leukemia Mother   . Diabetes Maternal Uncle     BP 120/80  Pulse 73  Temp(Src) 97.8 F (36.6 C) (Oral)  Ht 5\' 2"  (1.575 m)  Wt 128 lb 6.4 oz (58.242 kg)  BMI 23.48 kg/m2  SpO2 94%   Review of Systems Denies loc    Objective:   Physical Exam VITAL SIGNS:  See vs  page GENERAL: no distress PSYCH: Alert and oriented x 3.  Does not appear anxious nor depressed.     Assessment & Plan:  DM.  The next step is to eliminate or minimize hypoglycemia.

## 2011-04-19 NOTE — Patient Instructions (Addendum)
Reduce levemir to 11 units 2x a day, and: continue apidra, 9 units 3x a day (just before each meal). check your blood sugar 4 times a day--before the 3 meals, and at bedtime.  also check if you have symptoms of your blood sugar being too high or too low.  please keep a record of the readings and bring it to your next appointment here.  please call us sooner if your blood sugar goes below 70, or if it stays over 200. Please come back for a follow-up appointment for 1 month.  Please make an appointment.

## 2011-04-23 ENCOUNTER — Ambulatory Visit (INDEPENDENT_AMBULATORY_CARE_PROVIDER_SITE_OTHER): Payer: BC Managed Care – PPO | Admitting: Internal Medicine

## 2011-04-23 ENCOUNTER — Telehealth: Payer: Self-pay | Admitting: *Deleted

## 2011-04-23 VITALS — BP 110/70 | HR 67

## 2011-04-23 DIAGNOSIS — E162 Hypoglycemia, unspecified: Secondary | ICD-10-CM

## 2011-04-23 DIAGNOSIS — E109 Type 1 diabetes mellitus without complications: Secondary | ICD-10-CM

## 2011-04-23 DIAGNOSIS — G43009 Migraine without aura, not intractable, without status migrainosus: Secondary | ICD-10-CM

## 2011-04-23 MED ORDER — OXYCODONE HCL 15 MG PO TABS: 15.0000 mg | ORAL_TABLET | ORAL | Status: DC

## 2011-04-23 MED ORDER — OXYCODONE HCL 20 MG PO TB12: 20.0000 mg | ORAL_TABLET | Freq: Two times a day (BID) | ORAL | Status: DC

## 2011-04-23 NOTE — Telephone Encounter (Signed)
Ran out sooner - had to take more Oxycodone due to more pain - ran out. We need to go back to tid Refilled Rx's CBG #1  =47  Rechecked after 1 coke: CBG #2  =93

## 2011-04-23 NOTE — Telephone Encounter (Signed)
i think dr plotnikov prescribes this.

## 2011-04-23 NOTE — Telephone Encounter (Signed)
Pt is requesting refill of Oxycodone 15mg  last written 03/23/2011 #90 with 0 refills.

## 2011-04-24 ENCOUNTER — Encounter: Payer: Self-pay | Admitting: Internal Medicine

## 2011-04-24 ENCOUNTER — Ambulatory Visit: Payer: BC Managed Care – PPO

## 2011-04-24 DIAGNOSIS — E162 Hypoglycemia, unspecified: Secondary | ICD-10-CM | POA: Insufficient documentation

## 2011-04-24 LAB — GLUCOSE, POCT (MANUAL RESULT ENTRY): POC Glucose: 47

## 2011-04-24 NOTE — Assessment & Plan Note (Signed)
Continue with current prescription therapy as reflected on the Med list.  

## 2011-04-24 NOTE — Progress Notes (Signed)
  Subjective:    Patient ID: Lindsey Gomez, female    DOB: 10/23/1945, 66 y.o.   MRN: [id number]  HPI  The pt was w/in today due to issues with pain meds. She was c/o weakness and her CBG was checked - 46. CBG improved w/coke, she felt better  Review of Systems  Constitutional: Negative for chills.  HENT: Negative for congestion.   Genitourinary: Negative for frequency.  Neurological: Positive for headaches.  Psychiatric/Behavioral: The patient is nervous/anxious.        Objective:   Physical Exam  Constitutional: She appears well-developed. No distress.  HENT:  Head: Normocephalic.  Right Ear: External ear normal.  Left Ear: External ear normal.  Nose: Nose normal.  Mouth/Throat: Oropharynx is clear and moist.  Eyes: Conjunctivae are normal. Pupils are equal, round, and reactive to light. Right eye exhibits no discharge. Left eye exhibits no discharge.  Neck: Normal range of motion. Neck supple. No JVD present. No tracheal deviation present. No thyromegaly present.  Cardiovascular: Normal rate, regular rhythm and normal heart sounds.   Pulmonary/Chest: No stridor. No respiratory distress. She has no wheezes.  Abdominal: Soft. Bowel sounds are normal. She exhibits no distension and no mass. There is no tenderness. There is no rebound and no guarding.  Musculoskeletal: She exhibits no edema and no tenderness.  Lymphadenopathy:    She has no cervical adenopathy.  Neurological: She displays normal reflexes. No cranial nerve deficit. She exhibits normal muscle tone. Coordination normal.  Skin: No rash noted. No erythema.  Psychiatric: Her behavior is normal. Judgment and thought content normal.       anxious          Assessment & Plan:

## 2011-04-24 NOTE — Assessment & Plan Note (Signed)
04/23/11 - corrected

## 2011-04-26 ENCOUNTER — Other Ambulatory Visit: Payer: Self-pay | Admitting: *Deleted

## 2011-04-26 MED ORDER — INSULIN PEN NEEDLE 31G X 8 MM MISC: Status: DC

## 2011-04-26 NOTE — Telephone Encounter (Signed)
R'cd fax from [facility] for updated rx for pen needles.

## 2011-05-01 ENCOUNTER — Ambulatory Visit: Payer: BC Managed Care – PPO | Admitting: Endocrinology

## 2011-05-21 ENCOUNTER — Ambulatory Visit (INDEPENDENT_AMBULATORY_CARE_PROVIDER_SITE_OTHER): Payer: BC Managed Care – PPO | Admitting: Internal Medicine

## 2011-05-21 ENCOUNTER — Encounter: Payer: Self-pay | Admitting: Internal Medicine

## 2011-05-21 ENCOUNTER — Other Ambulatory Visit (INDEPENDENT_AMBULATORY_CARE_PROVIDER_SITE_OTHER): Payer: BC Managed Care – PPO

## 2011-05-21 VITALS — BP 108/78 | HR 84 | Temp 96.1°F | Resp 16 | Wt 129.0 lb

## 2011-05-21 DIAGNOSIS — Z720 Tobacco use: Secondary | ICD-10-CM

## 2011-05-21 DIAGNOSIS — G43009 Migraine without aura, not intractable, without status migrainosus: Secondary | ICD-10-CM

## 2011-05-21 DIAGNOSIS — F17200 Nicotine dependence, unspecified, uncomplicated: Secondary | ICD-10-CM

## 2011-05-21 DIAGNOSIS — E109 Type 1 diabetes mellitus without complications: Secondary | ICD-10-CM

## 2011-05-21 DIAGNOSIS — I10 Essential (primary) hypertension: Secondary | ICD-10-CM

## 2011-05-21 DIAGNOSIS — Z9181 History of falling: Secondary | ICD-10-CM

## 2011-05-21 DIAGNOSIS — R296 Repeated falls: Secondary | ICD-10-CM

## 2011-05-21 DIAGNOSIS — IMO0001 Reserved for inherently not codable concepts without codable children: Secondary | ICD-10-CM

## 2011-05-21 DIAGNOSIS — F411 Generalized anxiety disorder: Secondary | ICD-10-CM

## 2011-05-21 LAB — HEPATIC FUNCTION PANEL
Bilirubin, Direct: 0 mg/dL (ref 0.0–0.3)
Total Bilirubin: 0.1 mg/dL — ABNORMAL LOW (ref 0.3–1.2)

## 2011-05-21 LAB — BASIC METABOLIC PANEL
BUN: 12 mg/dL (ref 6–23)
Calcium: 9.7 mg/dL (ref 8.4–10.5)
Creatinine, Ser: 0.8 mg/dL (ref 0.4–1.2)
GFR: 76.74 mL/min (ref >60.00)

## 2011-05-21 LAB — HEMOGLOBIN A1C: Hgb A1c MFr Bld: 8.1 % — ABNORMAL HIGH (ref 4.6–6.5)

## 2011-05-21 MED ORDER — OXYCODONE HCL 20 MG PO TB12: 20.0000 mg | ORAL_TABLET | Freq: Two times a day (BID) | ORAL | Status: DC

## 2011-05-21 MED ORDER — OXYCODONE HCL 15 MG PO TABS: 15.0000 mg | ORAL_TABLET | ORAL | Status: DC

## 2011-05-21 MED ORDER — AMITRIPTYLINE HCL 50 MG PO TABS: 25.0000 mg | ORAL_TABLET | Freq: Every day | ORAL | Status: DC

## 2011-05-21 NOTE — Assessment & Plan Note (Signed)
Continue with current prescription therapy as reflected on the Med list.  

## 2011-05-21 NOTE — Assessment & Plan Note (Signed)
Better  Discussed - reduce Amitriptyline

## 2011-05-21 NOTE — Assessment & Plan Note (Signed)
Refractory. Can't quit

## 2011-05-21 NOTE — Progress Notes (Signed)
Patient ID: Lindsey Gomez, female   DOB: 06-15-1945, 66 y.o.   MRN: [id number] Patient ID: Lindsey Gomez, female   DOB: 1945/11/04, 66 y.o.   MRN: [id number]  Subjective:    Patient ID: Lindsey Gomez, female    DOB: [birth year], 66 y.o.   MRN: [id number]  HPI   The patient presents for a follow-up of  Chronic HAs, depression, anxiety, hypertension, chronic dyslipidemia, type 1 diabetes controlled with medicines. C/o R CP - better now on 4 oxycod/d - she saw Dr Jannetta Quint Readings from Last 3 Encounters:  05/21/11 129 lb (58.514 kg)  04/19/11 128 lb 6.4 oz (58.242 kg)  03/30/11 128 lb (58.06 kg)   BP Readings from Last 3 Encounters:  05/21/11 108/78  04/24/11 110/70  04/19/11 120/80      Review of Systems  Constitutional: Positive for fatigue. Negative for chills, activity change, appetite change and unexpected weight change.  HENT: Negative for congestion, mouth sores and sinus pressure.   Eyes: Negative for visual disturbance.  Respiratory: Negative for cough and chest tightness.   Gastrointestinal: Negative for nausea and abdominal pain.  Genitourinary: Negative for frequency, difficulty urinating and vaginal pain.  Musculoskeletal: Positive for arthralgias and gait problem. Negative for back pain.  Skin: Negative for pallor and rash.  Neurological: Positive for headaches. Negative for dizziness, tremors, weakness and numbness.  Psychiatric/Behavioral: Negative for suicidal ideas, confusion, sleep disturbance and self-injury. The patient is nervous/anxious.        Objective:   Physical Exam  Constitutional: She appears well-developed and well-nourished. No distress.  HENT:  Head: Normocephalic.  Right Ear: External ear normal.  Left Ear: External ear normal.  Nose: Nose normal.  Mouth/Throat: Oropharynx is clear and moist.  Eyes: Conjunctivae are normal. Pupils are equal, round, and reactive to light. Right eye exhibits no discharge. Left eye exhibits no discharge.  Neck:  Normal range of motion. Neck supple. No JVD present. No tracheal deviation present. No thyromegaly present.  Cardiovascular: Normal rate, regular rhythm and normal heart sounds.   Pulmonary/Chest: No stridor. No respiratory distress. She has no wheezes.  Abdominal: Soft. Bowel sounds are normal. She exhibits no distension and no mass. There is no tenderness. There is no rebound and no guarding.  Musculoskeletal: She exhibits no edema and no tenderness.  Lymphadenopathy:    She has no cervical adenopathy.  Neurological: She displays normal reflexes. No cranial nerve deficit. She exhibits normal muscle tone. Coordination normal.  Skin: Skin is warm. No rash noted. No erythema.  Psychiatric: Her behavior is normal. Judgment and thought content normal.       Depressed and tearful     Lab Results  Component Value Date   WBC 5.3 12/04/2010   HGB 10.6* 12/04/2010   HCT 32.7* 12/04/2010   PLT 212.0 12/04/2010   GLUCOSE 280* 12/04/2010   CHOL 226* 06/15/2010   TRIG 213.0* 06/15/2010   HDL 62.60 06/15/2010   LDLDIRECT 131.9 06/15/2010   LDLCALC 99 05/20/2006   ALT 15 12/04/2010   AST 20 12/04/2010   NA 138 12/04/2010   K 4.2 12/04/2010   CL 107 12/04/2010   CREATININE 0.7 12/04/2010   BUN 11 12/04/2010   CO2 23 12/04/2010   TSH 1.32 09/01/2010   INR 0.9 09/01/2010   HGBA1C 8.8* 12/04/2010   MICROALBUR 0.9 03/22/2008       Assessment & Plan:

## 2011-05-21 NOTE — Patient Instructions (Signed)
Taper off your amitriptyline - take 1/2 tab x 1 wk, then stop

## 2011-05-22 ENCOUNTER — Encounter: Payer: Self-pay | Admitting: Gastroenterology

## 2011-05-22 ENCOUNTER — Encounter: Payer: Self-pay | Admitting: Internal Medicine

## 2011-05-22 ENCOUNTER — Telehealth: Payer: Self-pay | Admitting: Internal Medicine

## 2011-05-22 NOTE — Telephone Encounter (Signed)
Lindsey Gomez, please, inform patient that all labs are normal except for elev A1c - it is better however Thx

## 2011-05-22 NOTE — Telephone Encounter (Signed)
Pt informed

## 2011-05-24 ENCOUNTER — Encounter: Payer: Self-pay | Admitting: Endocrinology

## 2011-05-24 ENCOUNTER — Ambulatory Visit (INDEPENDENT_AMBULATORY_CARE_PROVIDER_SITE_OTHER): Payer: BC Managed Care – PPO | Admitting: Endocrinology

## 2011-05-24 VITALS — BP 122/82 | HR 73 | Temp 97.2°F | Wt 128.8 lb

## 2011-05-24 DIAGNOSIS — E109 Type 1 diabetes mellitus without complications: Secondary | ICD-10-CM

## 2011-05-24 NOTE — Progress Notes (Signed)
Patient ID: Lindsey Gomez, female   DOB: [birth year], 66 y.o.   MRN: [id number] Patient ID: Lindsey Gomez, female   DOB: 06-13-1945, 66 y.o.   MRN: [id number]  Subjective:    Patient ID: Lindsey Gomez, female    DOB: 1945-03-26, 66 y.o.   MRN: [id number]  Diabetes  Pt has h/o insulin-requiring DM (1990).  It was dx'ed after a 40% pancreatectomy at [facility], for pancreatitis. she brings a record of her cbg's which i have reviewed today.  It varies from 65-250, but most are 100-200.  She has had several episodes of mild hypoglycemia in the afternoon, or before breakfast.   Past Medical History  Diagnosis Date  . Depression   . Type II or unspecified type diabetes mellitus without mention of complication, not stated as uncontrolled   . HTN (hypertension)   . Chronic pain   . Fibromyalgia   . Migraines   . History of breast cancer     Right  . Chronic pancreatitis   . Vitamin d deficiency   . GERD (gastroesophageal reflux disease)   . Finger dislocation 2009    L 3rd  . COPD (chronic obstructive pulmonary disease)   . Anemia   . Fractured sternum 11/2008  . Osteoporosis     Reclast too expensive  . Barrett's esophagus   . Chronic fatigue   . Diverticulosis     Past Surgical History  Procedure Date  . Nasal sinus surgery   . Pancreatectomy     40%  . Cholecystectomy   . Appendectomy   . Abdominal hysterectomy     History   Social History  . Marital Status: Married    Spouse Name: [name]    Number of Children: N/A  . Years of Education: N/A   Occupational History  . 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 Smoking status: Current Everyday Smoker -- 2.0 packs/day  . Smokeless tobacco: Never Used  . Alcohol Use: No  . Drug Use: No  . Sexually Active: No   Other Topics Concern  . Not on file   Social History Narrative   Did not go back to work Oct 23, [name] is very ill - unable to have financial ends meet, no incomeRegular Exercise -  NOCoffee  daily     Current Outpatient Prescriptions on File Prior to Visit  Medication Sig Dispense Refill  . ACCU-CHEK SOFTCLIX LANCETS lancets as directed. Use as instructed       . amitriptyline (ELAVIL) 50 MG tablet Take 0.5-1 tablets (25-50 mg total) by mouth at bedtime.  60 tablet  5  . amLODipine-benazepril (LOTREL) 5-10 MG per capsule Take 1 capsule by mouth daily.  30 capsule  11  . atenolol (TENORMIN) 100 MG tablet Take 1 tablet (100 mg total) by mouth every morning.  30 tablet  11  . atorvastatin (LIPITOR) 10 MG tablet Take 1 tablet (10 mg total) by mouth daily.  30 tablet  11  . calcitRIOL (ROCALTROL) 0.5 MCG capsule Take 1 capsule (0.5 mcg total) by mouth 2 (two) times daily.  60 capsule  11  . citalopram (CELEXA) 20 MG tablet Take 2 tablets (40 mg total) by mouth daily.  60 tablet  11  . clotrimazole-betamethasone (LOTRISONE) cream Apply topically 2 (two) times daily.  135 g  3  . diclofenac sodium (VOLTAREN) 1 % GEL Apply topically 4 (four) times daily as needed.        [name]  eletriptan (RELPAX) 40 MG tablet One tablet by mouth as needed for migraine headache.  If the headache improves and then returns, dose may be repeated after 2 hours have elapsed since first dose (do not exceed 80 mg per day). For migraine       . glucose blood (ACCU-CHEK AVIVA PLUS) test strip 1 each by Other route 4 (four) times daily. And lancets 4/day 250.03  120 each  11  . hydrocortisone (CORTEF) 10 MG tablet Take 1 tablet (10 mg total) by mouth 2 (two) times daily. Morning and at lunch  60 tablet  11  . insulin detemir (LEVEMIR) 100 UNIT/ML injection Inject 10 Units into the skin 2 (two) times daily.       . insulin glulisine (APIDRA) 100 UNIT/ML injection 3 (three) times daily before meals. 9 units with breakfast and lunch, and 10 units with evening meal      . Insulin Pen Needle (B-D ULTRAFINE III SHORT PEN) 31G X 8 MM MISC Use as directed 5 times daily  200 each  11  . Ketorolac Tromethamine (SPRIX) 15.75 MG/SPRAY  SOLN 1 spray by Nasal route 3 (three) times daily as needed.        [name] LORazepam (ATIVAN) 2 MG tablet Take 1 tablet (2 mg total) by mouth 3 (three) times daily as needed.  90 tablet  5  . NASONEX 50 MCG/ACT nasal spray USE 1 OR 2 SPRAYS IN EACH NOSTRIL ONCE DAILY.  1 Inhaler  2  . omeprazole (PRILOSEC) 40 MG capsule Take 1 capsule (40 mg total) by mouth 2 (two) times daily.  60 capsule  11  . oxyCODONE (OXYCONTIN) 20 MG 12 hr tablet Take 1 tablet (20 mg total) by mouth every 12 (twelve) hours. Please fill on or after  06/03/11  60 tablet  0  . oxyCODONE (ROXICODONE) 15 MG immediate release tablet Take 1 tablet (15 mg total) by mouth as directed. 2 to 3 times daily as needed for pain  90 tablet  0  . prochlorperazine (COMPAZINE) 10 MG tablet Take 1 tablet (10 mg total) by mouth 4 (four) times daily as needed. For nausea  30 tablet  3  . ranitidine (ZANTAC) 300 MG tablet TAKE 1 TABLET ONCE DAILY.  30 tablet  11  . tamoxifen (NOLVADEX) 20 MG tablet Take 20 mg by mouth 2 (two) times daily.        [name] topiramate (TOPAMAX) 200 MG tablet Take 1 tablet (200 mg total) by mouth 2 (two) times daily.  60 tablet  5  . triamcinolone (KENALOG) 0.1 % ointment Apply topically 2 (two) times daily as needed.        . zolmitriptan (ZOMIG) 5 MG tablet Take 5 mg by mouth as needed.         Current Facility-Administered Medications on File Prior to Visit  Medication Dose Route Frequency Provider Last Rate Last Dose  . 0.9 %  sodium chloride infusion  500 mL Intravenous Continuous Louis Meckel, MD        Allergies  Allergen Reactions  . Milnacipran     REACTION: HA and hand swelling  . Moxifloxacin   . Rizatriptan Benzoate     REACTION: n/v  . Sulfonamide Derivatives   . Venlafaxine     Family History  Problem Relation Age of Onset  . Colon cancer Neg Hx   . COPD Father   . Heart disease Father   . Leukemia Mother   . Diabetes Maternal Uncle  BP 122/82  Pulse 73  Temp(Src) 97.2 F (36.2 C) (Oral)   Wt 128 lb 12.8 oz (58.423 kg)  SpO2 97%   Review of Systems  denies loc    Objective:   Physical Exam    Lab Results  Component Value Date   HGBA1C 8.1* 05/21/2011      Assessment & Plan:  DM.  This a1c does not fully reflect the recent improvement of her cbg's

## 2011-05-24 NOTE — Patient Instructions (Addendum)
Reduce levemir to 10 units 2x a day, and: continue apidra, 3x a day (just before each meal), 11-18-08 units. check your blood sugar 4 times a day--before the 3 meals, and at bedtime.  also check if you have symptoms of your blood sugar being too high or too low.  please keep a record of the readings and bring it to your next appointment here.  please call us sooner if your blood sugar goes below 70, or if it stays over 200. Please come back for a follow-up appointment in 6 weeks.

## 2011-06-01 ENCOUNTER — Ambulatory Visit: Payer: BC Managed Care – PPO | Admitting: Internal Medicine

## 2011-06-03 ENCOUNTER — Other Ambulatory Visit: Payer: Self-pay | Admitting: Internal Medicine

## 2011-06-04 ENCOUNTER — Ambulatory Visit: Payer: BC Managed Care – PPO | Admitting: *Deleted

## 2011-06-05 ENCOUNTER — Ambulatory Visit (INDEPENDENT_AMBULATORY_CARE_PROVIDER_SITE_OTHER): Payer: BC Managed Care – PPO | Admitting: Internal Medicine

## 2011-06-05 ENCOUNTER — Encounter: Payer: Self-pay | Admitting: Internal Medicine

## 2011-06-05 VITALS — BP 112/72 | HR 77 | Temp 97.5°F | Ht 62.0 in | Wt 127.2 lb

## 2011-06-05 DIAGNOSIS — M25562 Pain in left knee: Secondary | ICD-10-CM

## 2011-06-05 DIAGNOSIS — E109 Type 1 diabetes mellitus without complications: Secondary | ICD-10-CM

## 2011-06-05 DIAGNOSIS — M25569 Pain in unspecified knee: Secondary | ICD-10-CM

## 2011-06-05 DIAGNOSIS — M76899 Other specified enthesopathies of unspecified lower limb, excluding foot: Secondary | ICD-10-CM

## 2011-06-05 DIAGNOSIS — M7072 Other bursitis of hip, left hip: Secondary | ICD-10-CM | POA: Insufficient documentation

## 2011-06-05 MED ORDER — LIDOCAINE 5 % EX PTCH: 1.0000 | MEDICATED_PATCH | CUTANEOUS | Status: AC

## 2011-06-05 MED ORDER — PREDNISONE 10 MG PO TABS: 10.0000 mg | ORAL_TABLET | Freq: Every day | ORAL | Status: DC

## 2011-06-05 MED ORDER — CYCLOBENZAPRINE HCL 5 MG PO TABS: 5.0000 mg | ORAL_TABLET | Freq: Three times a day (TID) | ORAL | Status: AC | PRN

## 2011-06-05 NOTE — Assessment & Plan Note (Signed)
I think the source of most her complaint today, c/w tendonitis, to cont pain med she has, muscle relaxer prn, and predpack asd prn trial, consider ortho referral

## 2011-06-05 NOTE — Patient Instructions (Signed)
Take all new medications as prescribed Continue all other medications as before Remember, the prednisone can increase your blood sugar, so watch this carefully, and call if consistently more than 200

## 2011-06-05 NOTE — Assessment & Plan Note (Signed)
Milder than the knee tendonitis isssue it seems, but same tx applies,  to f/u any worsening symptoms or concerns

## 2011-06-05 NOTE — Progress Notes (Signed)
Subjective:    Patient ID: Lindsey Gomez, female    DOB: [birth year], 66 y.o.   MRN: [id number]  HPI  Here with 10 days onset pain to left leg, hard to localize but at least can say she has no LBP, and nonradicular, mild to mod overall but severe on occasion with standing and walking, and hurts to lie on left side at night in bed, nothing else seems to make worse or better.   Denies urinary symptoms such as dysuria, frequency, urgency,or hematuria.  Denies worsening reflux, dysphagia, abd pain, n/v, bowel change or blood.  No fever, trauma, falls, worst pain seems to be near the knee.   Past Medical History  Diagnosis Date  . Depression   . Type II or unspecified type diabetes mellitus without mention of complication, not stated as uncontrolled   . HTN (hypertension)   . Chronic pain   . Fibromyalgia   . Migraines   . History of breast cancer     Right  . Chronic pancreatitis   . Vitamin d deficiency   . GERD (gastroesophageal reflux disease)   . Finger dislocation 2009    L 3rd  . COPD (chronic obstructive pulmonary disease)   . Anemia   . Fractured sternum 11/2008  . Osteoporosis     Reclast too expensive  . Barrett's esophagus   . Chronic fatigue   . Diverticulosis    Past Surgical History  Procedure Date  . Nasal sinus surgery   . Pancreatectomy     40%  . Cholecystectomy   . Appendectomy   . Abdominal hysterectomy     reports that she has been smoking.  She has never used smokeless tobacco. She reports that she does not drink alcohol or use illicit drugs. family history includes COPD in her father; Diabetes in her maternal uncle; Heart disease in her father; and Leukemia in her mother.  There is no history of Colon cancer. Allergies  Allergen Reactions  . Milnacipran     REACTION: HA and hand swelling  . Moxifloxacin   . Rizatriptan Benzoate     REACTION: n/v  . Sulfonamide Derivatives   . Venlafaxine    Current Outpatient Prescriptions on File Prior to Visit    Medication Sig Dispense Refill  . ACCU-CHEK SOFTCLIX LANCETS lancets as directed. Use as instructed       . amitriptyline (ELAVIL) 50 MG tablet Take 0.5-1 tablets (25-50 mg total) by mouth at bedtime.  60 tablet  5  . amLODipine-benazepril (LOTREL) 5-10 MG per capsule Take 1 capsule by mouth daily.  30 capsule  11  . atenolol (TENORMIN) 100 MG tablet Take 1 tablet (100 mg total) by mouth every morning.  30 tablet  11  . atorvastatin (LIPITOR) 10 MG tablet Take 1 tablet (10 mg total) by mouth daily.  30 tablet  11  . calcitRIOL (ROCALTROL) 0.5 MCG capsule Take 1 capsule (0.5 mcg total) by mouth 2 (two) times daily.  60 capsule  11  . citalopram (CELEXA) 20 MG tablet Take 2 tablets (40 mg total) by mouth daily.  60 tablet  11  . clotrimazole-betamethasone (LOTRISONE) cream Apply topically 2 (two) times daily.  135 g  3  . diclofenac sodium (VOLTAREN) 1 % GEL Apply topically 4 (four) times daily as needed.        . eletriptan (RELPAX) 40 MG tablet One tablet by mouth as needed for migraine headache.  If the headache improves and then returns, dose may  be repeated after 2 hours have elapsed since first dose (do not exceed 80 mg per day). For migraine       . glucose blood (ACCU-CHEK AVIVA PLUS) test strip 1 each by Other route 4 (four) times daily. And lancets 4/day 250.03  120 each  11  . hydrocortisone (CORTEF) 10 MG tablet Take 1 tablet (10 mg total) by mouth 2 (two) times daily. Morning and at lunch  60 tablet  11  . insulin detemir (LEVEMIR) 100 UNIT/ML injection Inject 10 Units into the skin 2 (two) times daily.       . insulin glulisine (APIDRA) 100 UNIT/ML injection 3 (three) times daily before meals. 9 units with breakfast and lunch, and 10 units with evening meal      . Insulin Pen Needle (B-D ULTRAFINE III SHORT PEN) 31G X 8 MM MISC Use as directed 5 times daily  200 each  11  . Ketorolac Tromethamine (SPRIX) 15.75 MG/SPRAY SOLN 1 spray by Nasal route 3 (three) times daily as needed.         [name] LORazepam (ATIVAN) 2 MG tablet Take 1 tablet (2 mg total) by mouth 3 (three) times daily as needed.  90 tablet  5  . NASONEX 50 MCG/ACT nasal spray USE 1 OR 2 SPRAYS IN EACH NOSTRIL ONCE DAILY.  1 Inhaler  2  . omeprazole (PRILOSEC) 40 MG capsule Take 1 capsule (40 mg total) by mouth 2 (two) times daily.  60 capsule  11  . oxyCODONE (OXYCONTIN) 20 MG 12 hr tablet Take 1 tablet (20 mg total) by mouth every 12 (twelve) hours. Please fill on or after  06/03/11  60 tablet  0  . oxyCODONE (ROXICODONE) 15 MG immediate release tablet Take 1 tablet (15 mg total) by mouth as directed. 2 to 3 times daily as needed for pain  90 tablet  0  . prochlorperazine (COMPAZINE) 10 MG tablet Take 1 tablet (10 mg total) by mouth 4 (four) times daily as needed. For nausea  30 tablet  3  . ranitidine (ZANTAC) 300 MG tablet TAKE 1 TABLET ONCE DAILY.  30 tablet  11  . tamoxifen (NOLVADEX) 20 MG tablet Take 20 mg by mouth 2 (two) times daily.        [name] topiramate (TOPAMAX) 200 MG tablet Take 1 tablet (200 mg total) by mouth 2 (two) times daily.  60 tablet  5  . triamcinolone (KENALOG) 0.1 % ointment Apply topically 2 (two) times daily as needed.        [name] ZOMIG 5 MG tablet TAKE 1 TABLET DAILY IF NEEDED FOR MIGRAINE.  8 each  2   Current Facility-Administered Medications on File Prior to Visit  Medication Dose Route Frequency Provider Last Rate Last Dose  . 0.9 %  sodium chloride infusion  500 mL Intravenous Continuous Louis Meckel, MD       Review of Systems Review of Systems  Constitutional: Negative for diaphoresis and unexpected weight change.  HENT: Negative for drooling and tinnitus.   Eyes: Negative for photophobia and visual disturbance.  Respiratory: Negative for choking and stridor.   Gastrointestinal: Negative for vomiting and blood in stool.  Genitourinary: Negative for hematuria and decreased urine volume.  Skin: Negative for color change and wound.  Neurological: Negative for tremors and numbness.      Objective:   Physical Exam BP 112/72  Pulse 77  Temp(Src) 97.5 F (36.4 C) (Oral)  Ht 5\' 2"  (1.575 m)  Wt 127 lb  4 oz (57.72 kg)  BMI 23.27 kg/m2  SpO2 94% Physical Exam  VS noted Constitutional: Pt appears well-developed and well-nourished.  HENT: Head: Normocephalic.  Right Ear: External ear normal.  Left Ear: External ear normal.  Eyes: Conjunctivae and EOM are normal. Pupils are equal, round, and reactive to light.  Neck: Normal range of motion. Neck supple.  Cardiovascular: Normal rate and regular rhythm.   Pulmonary/Chest: Effort normal and breath sounds normal.  Abd:  Soft, NT, non-distended, + BS Neurological: Pt is alert. No cranial nerve deficit. spine nontender, no paravertebral tender Skin: Skin is warm. No erythema. No rash Psychiatric: Pt behavior is normal. Thought content normal.  Has mild to mod tender to left lateral hip over greater trochanter, also with mod tender to left medial post ligamentous complex LLE o/w neurovasc intact      Assessment & Plan:

## 2011-06-05 NOTE — Assessment & Plan Note (Signed)
stable overall by hx and exam, most recent data reviewed with pt, and pt to continue medical treatment as before, sees Dr Everardo All, pt to call for cbg > 200 on the predpack tx

## 2011-06-07 ENCOUNTER — Telehealth: Payer: Self-pay | Admitting: *Deleted

## 2011-06-07 NOTE — Telephone Encounter (Signed)
Ok to increase the levemir to 15 units bid while on prednisone only (with goal to keep blood sugar < 200)  To go back to the 10 units bid after prednisone stopped

## 2011-06-07 NOTE — Telephone Encounter (Signed)
Called pt, left message for pt to callback office.

## 2011-06-07 NOTE — Telephone Encounter (Signed)
Pt informed of MD's advisement. 

## 2011-06-07 NOTE — Telephone Encounter (Signed)
Pt started Prednisone on yesterday and her CBG's have increased into the low 200's-mid 300's. Pt is asking for MD's advisement regarding her CBG's while on Prednisone-please advise in AVP's and SAE's absence. Thanks.

## 2011-06-14 ENCOUNTER — Other Ambulatory Visit: Payer: Self-pay | Admitting: Internal Medicine

## 2011-06-22 ENCOUNTER — Encounter: Payer: Self-pay | Admitting: Internal Medicine

## 2011-06-22 ENCOUNTER — Ambulatory Visit (INDEPENDENT_AMBULATORY_CARE_PROVIDER_SITE_OTHER): Payer: BC Managed Care – PPO | Admitting: Internal Medicine

## 2011-06-22 VITALS — BP 100/70 | HR 84 | Temp 97.6°F | Resp 16

## 2011-06-22 DIAGNOSIS — F411 Generalized anxiety disorder: Secondary | ICD-10-CM

## 2011-06-22 DIAGNOSIS — G43819 Other migraine, intractable, without status migrainosus: Secondary | ICD-10-CM

## 2011-06-22 DIAGNOSIS — Z720 Tobacco use: Secondary | ICD-10-CM

## 2011-06-22 DIAGNOSIS — J449 Chronic obstructive pulmonary disease, unspecified: Secondary | ICD-10-CM

## 2011-06-22 DIAGNOSIS — F329 Major depressive disorder, single episode, unspecified: Secondary | ICD-10-CM

## 2011-06-22 DIAGNOSIS — E559 Vitamin D deficiency, unspecified: Secondary | ICD-10-CM

## 2011-06-22 DIAGNOSIS — J4489 Other specified chronic obstructive pulmonary disease: Secondary | ICD-10-CM

## 2011-06-22 DIAGNOSIS — K219 Gastro-esophageal reflux disease without esophagitis: Secondary | ICD-10-CM

## 2011-06-22 DIAGNOSIS — R51 Headache: Secondary | ICD-10-CM

## 2011-06-22 DIAGNOSIS — I10 Essential (primary) hypertension: Secondary | ICD-10-CM

## 2011-06-22 DIAGNOSIS — F17200 Nicotine dependence, unspecified, uncomplicated: Secondary | ICD-10-CM

## 2011-06-22 DIAGNOSIS — IMO0001 Reserved for inherently not codable concepts without codable children: Secondary | ICD-10-CM

## 2011-06-22 DIAGNOSIS — G43009 Migraine without aura, not intractable, without status migrainosus: Secondary | ICD-10-CM

## 2011-06-22 DIAGNOSIS — F29 Unspecified psychosis not due to a substance or known physiological condition: Secondary | ICD-10-CM

## 2011-06-22 DIAGNOSIS — F3289 Other specified depressive episodes: Secondary | ICD-10-CM

## 2011-06-22 DIAGNOSIS — G43119 Migraine with aura, intractable, without status migrainosus: Secondary | ICD-10-CM

## 2011-06-22 DIAGNOSIS — E109 Type 1 diabetes mellitus without complications: Secondary | ICD-10-CM

## 2011-06-22 MED ORDER — AMLODIPINE BESY-BENAZEPRIL HCL 5-10 MG PO CAPS: 1.0000 | ORAL_CAPSULE | Freq: Every day | ORAL | Status: DC

## 2011-06-22 MED ORDER — ATORVASTATIN CALCIUM 10 MG PO TABS: 10.0000 mg | ORAL_TABLET | Freq: Every day | ORAL | Status: DC

## 2011-06-22 MED ORDER — AMITRIPTYLINE HCL 50 MG PO TABS: 25.0000 mg | ORAL_TABLET | Freq: Every day | ORAL | Status: DC

## 2011-06-22 MED ORDER — OXYCODONE HCL 20 MG PO TB12: 20.0000 mg | ORAL_TABLET | Freq: Two times a day (BID) | ORAL | Status: DC

## 2011-06-22 MED ORDER — MEPERIDINE HCL 50 MG PO TABS: 50.0000 mg | ORAL_TABLET | ORAL | Status: AC | PRN

## 2011-06-22 MED ORDER — ATENOLOL 100 MG PO TABS: 100.0000 mg | ORAL_TABLET | ORAL | Status: DC

## 2011-06-22 MED ORDER — OXYCODONE HCL 15 MG PO TABS: 15.0000 mg | ORAL_TABLET | ORAL | Status: DC

## 2011-06-22 MED ORDER — KETOROLAC TROMETHAMINE 60 MG/2ML IM SOLN
60.0000 mg | Freq: Once | INTRAMUSCULAR | Status: AC
  Administered 2011-06-22: 60 mg via INTRAMUSCULAR

## 2011-06-22 NOTE — Progress Notes (Signed)
Patient ID: Lindsey Gomez, female   DOB: October 04, 1945, 66 y.o.   MRN: [id number] Patient ID: Lindsey Gomez, female   DOB: 1946-02-06, 66 y.o.   MRN: [id number] Patient ID: Lindsey Gomez, female   DOB: [birth year], 67 y.o.   MRN: [id number]  Subjective:    Patient ID: Lindsey Gomez, female    DOB: 01-Jul-1945, 66 y.o.   MRN: [id number]  Migraine  Pertinent negatives include no abdominal pain, back pain, coughing, dizziness, nausea, numbness, sinus pressure or weakness.    C/o severe HA daily x 2 wks. C/o severe pain The patient presents for a follow-up of  Chronic HAs, depression, anxiety, hypertension, chronic dyslipidemia, type 1 diabetes controlled with medicines. C/o R CP - better now on 4 oxycod/d - she saw Dr Jannetta Quint Readings from Last 3 Encounters:  06/05/11 127 lb 4 oz (57.72 kg)  05/24/11 128 lb 12.8 oz (58.423 kg)  05/21/11 129 lb (58.514 kg)   BP Readings from Last 3 Encounters:  06/22/11 100/70  06/05/11 112/72  05/24/11 122/82      Review of Systems  Constitutional: Positive for fatigue. Negative for chills, activity change, appetite change and unexpected weight change.  HENT: Negative for congestion, mouth sores and sinus pressure.   Eyes: Negative for visual disturbance.  Respiratory: Negative for cough and chest tightness.   Gastrointestinal: Negative for nausea and abdominal pain.  Genitourinary: Negative for frequency, difficulty urinating and vaginal pain.  Musculoskeletal: Positive for arthralgias and gait problem. Negative for back pain.  Skin: Negative for pallor and rash.  Neurological: Positive for headaches. Negative for dizziness, tremors, weakness and numbness.  Psychiatric/Behavioral: Negative for suicidal ideas, confusion, sleep disturbance and self-injury. The patient is nervous/anxious.        Objective:   Physical Exam  Constitutional: She appears well-developed and well-nourished. No distress.  HENT:  Head: Normocephalic.  Right Ear: External ear  normal.  Left Ear: External ear normal.  Nose: Nose normal.  Mouth/Throat: Oropharynx is clear and moist.  Eyes: Conjunctivae are normal. Pupils are equal, round, and reactive to light. Right eye exhibits no discharge. Left eye exhibits no discharge.  Neck: Normal range of motion. Neck supple. No JVD present. No tracheal deviation present. No thyromegaly present.  Cardiovascular: Normal rate, regular rhythm and normal heart sounds.   Pulmonary/Chest: No stridor. No respiratory distress. She has no wheezes.  Abdominal: Soft. Bowel sounds are normal. She exhibits no distension and no mass. There is no tenderness. There is no rebound and no guarding.  Musculoskeletal: She exhibits no edema and no tenderness.  Lymphadenopathy:    She has no cervical adenopathy.  Neurological: She displays normal reflexes. No cranial nerve deficit. She exhibits normal muscle tone. Coordination normal.  Skin: Skin is warm. No rash noted. No erythema.  Psychiatric: Her behavior is normal. Judgment and thought content normal.       Depressed and tearful     Lab Results  Component Value Date   WBC 5.3 12/04/2010   HGB 10.6* 12/04/2010   HCT 32.7* 12/04/2010   PLT 212.0 12/04/2010   GLUCOSE 87 05/21/2011   CHOL 226* 06/15/2010   TRIG 213.0* 06/15/2010   HDL 62.60 06/15/2010   LDLDIRECT 131.9 06/15/2010   LDLCALC 99 05/20/2006   ALT 21 05/21/2011   AST 20 05/21/2011   NA 139 05/21/2011   K 3.9 05/21/2011   CL 108 05/21/2011   CREATININE 0.8 05/21/2011   BUN 12 05/21/2011   CO2 23  05/21/2011   TSH 1.32 09/01/2010   INR 0.9 09/01/2010   HGBA1C 8.1* 05/21/2011   MICROALBUR 0.9 03/22/2008       Assessment & Plan:

## 2011-06-22 NOTE — Assessment & Plan Note (Addendum)
Chronic sx's Continue with current prescription therapy as reflected on the Med list.  

## 2011-06-22 NOTE — Assessment & Plan Note (Addendum)
Better - no relapse 

## 2011-06-22 NOTE — Assessment & Plan Note (Signed)
Continue with current prescription therapy as reflected on the Med list.  

## 2011-06-22 NOTE — Assessment & Plan Note (Signed)
Discussed.

## 2011-06-22 NOTE — Assessment & Plan Note (Signed)
Severe HA x 2 wks Toradol IM now Demerol 50 mg/d prn tabs w/caution

## 2011-06-22 NOTE — Assessment & Plan Note (Signed)
Chronic pain CFS Chronic migraines Depression Chronic stress Opioid dependant  Potential benefits of a long term opioids use as well as potential risks (i.e. addiction risk, apnea etc) and complications (i.e. Somnolence, constipation and others) were explained to the patient and were aknowledged.  

## 2011-06-22 NOTE — Assessment & Plan Note (Addendum)
Continue with current prescription therapy as reflected on the Med list. [name] w/Dr Ellison 

## 2011-06-22 NOTE — Assessment & Plan Note (Signed)
Chronic and severe   Potential benefits of a long term opioids use as well as potential risks (i.e. addiction risk, apnea etc) and complications (i.e. Somnolence, constipation and others) were explained to the patient and were aknowledged.  Continue with current prescription therapy as reflected on the Med list.  

## 2011-06-22 NOTE — Assessment & Plan Note (Signed)
Discussed again 

## 2011-07-05 ENCOUNTER — Encounter: Payer: Self-pay | Admitting: Endocrinology

## 2011-07-05 ENCOUNTER — Ambulatory Visit (INDEPENDENT_AMBULATORY_CARE_PROVIDER_SITE_OTHER): Payer: BC Managed Care – PPO | Admitting: Endocrinology

## 2011-07-05 VITALS — BP 124/76 | HR 68 | Temp 97.3°F | Ht 62.0 in | Wt 128.0 lb

## 2011-07-05 DIAGNOSIS — E109 Type 1 diabetes mellitus without complications: Secondary | ICD-10-CM

## 2011-07-05 NOTE — Patient Instructions (Addendum)
Reduce levemir to 16 units at betime continue apidra, 3x a day (just before each meal), 12-19-09 units. check your blood sugar 4 times a day--before the 3 meals, and at bedtime.  also check if you have symptoms of your blood sugar being too high or too low.  please keep a record of the readings and bring it to your next appointment here.  please call us sooner if your blood sugar goes below 70, or if it stays over 200. Please come back for a follow-up appointment in 6 weeks.

## 2011-07-05 NOTE — Progress Notes (Signed)
Subjective:    Patient ID: Lindsey Gomez, female    DOB: [birth year], 66 y.o.   MRN: [id number]  HPI Pt has h/o insulin-requiring DM (1990).  It was dx'ed after a 40% pancreatectomy at [facility], for pancreatitis. she brings a record of her cbg's which i have reviewed today.  It varies from 65-250, but most are 100-200.  She has 2 episodes of hypoglycemia per week since last ov.  There is no trend throughout the day. Past Medical History  Diagnosis Date  . Depression   . Type II or unspecified type diabetes mellitus without mention of complication, not stated as uncontrolled   . HTN (hypertension)   . Chronic pain   . Fibromyalgia   . Migraines   . History of breast cancer     Right  . Chronic pancreatitis   . Vitamin d deficiency   . GERD (gastroesophageal reflux disease)   . Finger dislocation 2009    L 3rd  . COPD (chronic obstructive pulmonary disease)   . Anemia   . Fractured sternum 11/2008  . Osteoporosis     Reclast too expensive  . Barrett's esophagus   . Chronic fatigue   . Diverticulosis     Past Surgical History  Procedure Date  . Nasal sinus surgery   . Pancreatectomy     40%  . Cholecystectomy   . Appendectomy   . Abdominal hysterectomy     History   Social History  . Marital Status: Married    Spouse Name: [name]    Number of Children: N/A  . Years of Education: N/A   Occupational History  . 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 Smoking status: Current Everyday Smoker -- 2.0 packs/day  . Smokeless tobacco: Never Used  . Alcohol Use: No  . Drug Use: No  . Sexually Active: No   Other Topics Concern  . Not on file   Social History Narrative   Did not go back to work Oct 23, [name] is very ill - unable to have financial ends meet, no incomeRegular Exercise -  NOCoffee daily     Current Outpatient Prescriptions on File Prior to Visit  Medication Sig Dispense Refill  . ACCU-CHEK SOFTCLIX LANCETS lancets as directed. Use as  instructed       . amitriptyline (ELAVIL) 50 MG tablet Take 0.5-1 tablets (25-50 mg total) by mouth at bedtime.  60 tablet  11  . amLODipine-benazepril (LOTREL) 5-10 MG per capsule Take 1 capsule by mouth daily.  30 capsule  11  . atenolol (TENORMIN) 100 MG tablet Take 1 tablet (100 mg total) by mouth every morning.  30 tablet  11  . atorvastatin (LIPITOR) 10 MG tablet Take 1 tablet (10 mg total) by mouth daily.  30 tablet  11  . calcitRIOL (ROCALTROL) 0.5 MCG capsule Take 1 capsule (0.5 mcg total) by mouth 2 (two) times daily.  60 capsule  11  . citalopram (CELEXA) 20 MG tablet Take 2 tablets (40 mg total) by mouth daily.  60 tablet  11  . clotrimazole-betamethasone (LOTRISONE) cream Apply topically 2 (two) times daily.  135 g  3  . diclofenac sodium (VOLTAREN) 1 % GEL Apply topically 4 (four) times daily as needed.        . eletriptan (RELPAX) 40 MG tablet One tablet by mouth as needed for migraine headache.  If the headache improves and then returns, dose may be  repeated after 2 hours have elapsed since first dose (do not exceed 80 mg per day). For migraine       . glucose blood (ACCU-CHEK AVIVA PLUS) test strip 1 each by Other route 4 (four) times daily. And lancets 4/day 250.03  120 each  11  . hydrocortisone (CORTEF) 10 MG tablet Take 1 tablet (10 mg total) by mouth 2 (two) times daily. Morning and at lunch  60 tablet  11  . insulin detemir (LEVEMIR) 100 UNIT/ML injection Inject 16 Units into the skin at bedtime.       . insulin glulisine (APIDRA) 100 UNIT/ML injection 3 (three) times daily before meals. 10 units with breakfast and lunch, and 11 units with evening meal      . Insulin Pen Needle (B-D ULTRAFINE III SHORT PEN) 31G X 8 MM MISC Use as directed 5 times daily  200 each  11  . Ketorolac Tromethamine (SPRIX) 15.75 MG/SPRAY SOLN 1 spray by Nasal route 3 (three) times daily as needed.        [name] LORazepam (ATIVAN) 2 MG tablet Take 1 tablet (2 mg total) by mouth 3 (three) times daily as  needed.  90 tablet  5  . NASONEX 50 MCG/ACT nasal spray USE 1 OR 2 SPRAYS IN EACH NOSTRIL ONCE DAILY.  1 Inhaler  2  . omeprazole (PRILOSEC) 40 MG capsule Take 1 capsule (40 mg total) by mouth 2 (two) times daily.  60 capsule  11  . oxyCODONE (OXYCONTIN) 20 MG 12 hr tablet Take 1 tablet (20 mg total) by mouth every 12 (twelve) hours. Please fill on or after  07/04/11  60 tablet  0  . oxyCODONE (ROXICODONE) 15 MG immediate release tablet Take 1 tablet (15 mg total) by mouth as directed. 2 to 3 times daily as needed for pain  90 tablet  0  . prochlorperazine (COMPAZINE) 10 MG tablet Take 1 tablet (10 mg total) by mouth 4 (four) times daily as needed. For nausea  30 tablet  3  . ranitidine (ZANTAC) 300 MG tablet TAKE 1 TABLET ONCE DAILY.  30 tablet  11  . tamoxifen (NOLVADEX) 20 MG tablet Take 20 mg by mouth 2 (two) times daily.        . TOPAMAX 200 MG tablet TAKE 1 TABLET TWICE DAILY.  60 each  5  . triamcinolone (KENALOG) 0.1 % ointment Apply topically 2 (two) times daily as needed.        [name] ZOMIG 5 MG tablet TAKE 1 TABLET DAILY IF NEEDED FOR MIGRAINE.  8 each  2   Current Facility-Administered Medications on File Prior to Visit  Medication Dose Route Frequency Provider Last Rate Last Dose  . 0.9 %  sodium chloride infusion  500 mL Intravenous Continuous Louis Meckel, MD        Allergies  Allergen Reactions  . Milnacipran     REACTION: HA and hand swelling  . Moxifloxacin   . Rizatriptan Benzoate     REACTION: n/v  . Sulfonamide Derivatives   . Venlafaxine     Family History  Problem Relation Age of Onset  . Colon cancer Neg Hx   . COPD Father   . Heart disease Father   . Leukemia Mother   . Diabetes Maternal Uncle     BP 124/76  Pulse 68  Temp(Src) 97.3 F (36.3 C) (Oral)  Ht 5\' 2"  (1.575 m)  Wt 128 lb (58.06 kg)  BMI 23.41 kg/m2  SpO2 98%  Review of Systems Denies loc    Objective:   Physical Exam Pulses: dorsalis pedis intact bilat.   Feet: no deformity.  no  ulcer on the feet.  feet are of normal color and temp.  no edema. Few bilat varicosities. Neuro: sensation is intact to touch on the feet.       Assessment & Plan:  DM.  apparently slightly overcontrolled.

## 2011-07-10 ENCOUNTER — Telehealth: Payer: Self-pay

## 2011-07-10 NOTE — Telephone Encounter (Signed)
change apidra to 3x a day (just before each meal), 02-17-11 units. Ret as sched

## 2011-07-10 NOTE — Telephone Encounter (Signed)
What time of day was it 70?

## 2011-07-10 NOTE — Telephone Encounter (Signed)
Blood sugar was 70 at 4:30 or 5:00pm in the evening just before dinner.

## 2011-07-10 NOTE — Telephone Encounter (Signed)
Pt called to report CBGs - In the last 2 weeks pt has had 10 reading under 200, lowest 70 and 11 readings over 200 highest 412. Pt states that she checks sugars 4 times daily -  Before breakfast, before lunch, before dinner and at bedtime, Pt is requesting advisement.

## 2011-07-11 NOTE — Telephone Encounter (Signed)
Left message for pt to callback office.  

## 2011-07-11 NOTE — Telephone Encounter (Signed)
Pt informed of insulin adjustment.  

## 2011-07-12 ENCOUNTER — Encounter: Payer: BC Managed Care – PPO | Attending: Endocrinology | Admitting: *Deleted

## 2011-07-12 ENCOUNTER — Encounter: Payer: Self-pay | Admitting: *Deleted

## 2011-07-12 VITALS — Ht 62.5 in | Wt 129.2 lb

## 2011-07-12 DIAGNOSIS — Z713 Dietary counseling and surveillance: Secondary | ICD-10-CM | POA: Insufficient documentation

## 2011-07-12 DIAGNOSIS — E109 Type 1 diabetes mellitus without complications: Secondary | ICD-10-CM | POA: Insufficient documentation

## 2011-07-12 NOTE — Patient Instructions (Signed)
Plan: AM schedule: Take BG, have coffee, take pills, take Apidra insulin, drink supplement within 15 minutes of insulin shot Continue taking Lantus at bedtime but choose a set time such as 10PM Read food labels of supplements and call me with Total Carb information on label

## 2011-07-12 NOTE — Progress Notes (Signed)
  Medical Nutrition Therapy:  Appt start time: 1145 end time:  1245.  Assessment:  Primary concerns today: patient here for Diabetes Type 1 education and assistance with food options due to difficulty eating solids. She also cares for her husband who is bedridden and states very little support for his care. They live on [place] acres which also requires maintenance as well as owning a horse. She consumes either Ensure or Boost as supplemental calories to food.   MEDICATIONS: see list. Diabetes medications include Levemir 16 units at 11PM and Apidra 12 units at Breakfast and Supper, 8 units at Lunch   DIETARY INTAKE:  Usual eating pattern includes 3 meals and no snacks per day.  Everyday foods include soft or convenient foods.  Avoided foods include foods hard to chew including vegetables and meats.    24-hr recall:  B ( AM): Boost or Ensure or Glucerna, coffee with creamer and sugar x 1 tsp Snk ( AM): none  L ( PM): same or pancakes with fruit OR oatmeal with fruit, OR Boost etc,  coffee with creamer and sugar x 1 tsp  Snk ( PM): none D ( PM): same as lunch Snk ( PM): none Beverages: coffee  Usual physical activity: limited due to multiple medical problems and the care she gives her husband who is bedridden  Estimated energy needs: 1500 calories 170 g carbohydrates 112 g protein 42 g fat  Progress Towards Goal(s):  In progress.   Nutritional Diagnosis:  NI-5.11.1 Predicted suboptimal nutrient intake As related to diabetes .  As evidenced by A1c of 8.1% .    Intervention:  Nutrition counseling and diabetes education provided. Discussed basic physiology of diabetes, insulin action and importance of eating within 15 minutes of fast acting insulin to prevent hypoglycemia, importance of taking Levemir at more consistent time. Started with Carb Counting instruction to be continued at next visit Plan: AM schedule: Take BG, have coffee, take pills, take Apidra insulin, drink supplement within  15 minutes of insulin shot Continue taking Lantus at bedtime but choose a set time such as 10PM Read food labels of supplements and call me with Total Carb information on label  Handouts given during visit include: Living Well with Diabetes Carb Counting and Food Label handouts Meal Plan Card  Monitoring/Evaluation:  Dietary intake, exercise, reading food labels, and body weight in 4 week(s).

## 2011-07-16 ENCOUNTER — Ambulatory Visit (INDEPENDENT_AMBULATORY_CARE_PROVIDER_SITE_OTHER): Payer: BC Managed Care – PPO | Admitting: Internal Medicine

## 2011-07-16 ENCOUNTER — Encounter: Payer: Self-pay | Admitting: Internal Medicine

## 2011-07-16 VITALS — BP 120/90 | HR 84 | Temp 98.5°F | Resp 16 | Wt 127.0 lb

## 2011-07-16 DIAGNOSIS — G43109 Migraine with aura, not intractable, without status migrainosus: Secondary | ICD-10-CM

## 2011-07-16 DIAGNOSIS — IMO0001 Reserved for inherently not codable concepts without codable children: Secondary | ICD-10-CM

## 2011-07-16 DIAGNOSIS — G43909 Migraine, unspecified, not intractable, without status migrainosus: Secondary | ICD-10-CM

## 2011-07-16 DIAGNOSIS — R110 Nausea: Secondary | ICD-10-CM

## 2011-07-16 DIAGNOSIS — N63 Unspecified lump in unspecified breast: Secondary | ICD-10-CM | POA: Insufficient documentation

## 2011-07-16 DIAGNOSIS — F29 Unspecified psychosis not due to a substance or known physiological condition: Secondary | ICD-10-CM

## 2011-07-16 DIAGNOSIS — G43719 Chronic migraine without aura, intractable, without status migrainosus: Secondary | ICD-10-CM | POA: Insufficient documentation

## 2011-07-16 DIAGNOSIS — F17200 Nicotine dependence, unspecified, uncomplicated: Secondary | ICD-10-CM

## 2011-07-16 DIAGNOSIS — E109 Type 1 diabetes mellitus without complications: Secondary | ICD-10-CM

## 2011-07-16 DIAGNOSIS — F1120 Opioid dependence, uncomplicated: Secondary | ICD-10-CM

## 2011-07-16 MED ORDER — OXYCODONE HCL 15 MG PO TABS: 15.0000 mg | ORAL_TABLET | ORAL | Status: DC

## 2011-07-16 MED ORDER — OXYCODONE HCL 20 MG PO TB12: 20.0000 mg | ORAL_TABLET | Freq: Two times a day (BID) | ORAL | Status: DC

## 2011-07-16 MED ORDER — INSULIN GLULISINE 100 UNIT/ML ~~LOC~~ SOLN: SUBCUTANEOUS | Status: DC

## 2011-07-16 NOTE — Assessment & Plan Note (Signed)
  Chronic and severe  Potential benefits of a long term opioids use as well as potential risks (i.e. addiction risk, apnea etc) and complications (i.e. Somnolence, constipation and others) were explained to the patient and were aknowledged.  

## 2011-07-16 NOTE — Assessment & Plan Note (Signed)
Continue with current prescription therapy as reflected on the Med list.  

## 2011-07-16 NOTE — Assessment & Plan Note (Signed)
Refractory  

## 2011-07-16 NOTE — Progress Notes (Signed)
Patient ID: LAKETTA SODERBERG, female   DOB: Jul 10, 1945, 66 y.o.   MRN: [id number]  Subjective:    Patient ID: GAYLYN BERISH, female    DOB: 09-29-1945, 66 y.o.   MRN: [id number]  Migraine  Pertinent negatives include no abdominal pain, back pain, coughing, dizziness, nausea, numbness, sinus pressure or weakness.    C/o severe HA's - migraines. She was vomiting this am w/migraine. C/o severe pain. C/o doing worse w/pain and requesting me to increase her pain meds.  The patient presents for a follow-up of chronic HAs, depression, anxiety, hypertension, chronic dyslipidemia, type 1 diabetes controlled with medicines.   Wt Readings from Last 3 Encounters:  07/16/11 127 lb (57.607 kg)  07/12/11 129 lb 3.2 oz (58.605 kg)  07/05/11 128 lb (58.06 kg)   BP Readings from Last 3 Encounters:  07/16/11 120/90  07/05/11 124/76  06/22/11 100/70      Review of Systems  Constitutional: Positive for fatigue. Negative for chills, activity change, appetite change and unexpected weight change.  HENT: Negative for congestion, mouth sores and sinus pressure.   Eyes: Negative for visual disturbance.  Respiratory: Negative for cough and chest tightness.   Gastrointestinal: Negative for nausea and abdominal pain.  Genitourinary: Negative for frequency, difficulty urinating and vaginal pain.  Musculoskeletal: Positive for arthralgias and gait problem. Negative for back pain.  Skin: Negative for pallor and rash.  Neurological: Positive for headaches. Negative for dizziness, tremors, weakness and numbness.  Psychiatric/Behavioral: Negative for suicidal ideas, confusion, sleep disturbance and self-injury. The patient is nervous/anxious.        Objective:   Physical Exam  Constitutional: She appears well-developed and well-nourished. No distress.  HENT:  Head: Normocephalic.  Right Ear: External ear normal.  Left Ear: External ear normal.  Nose: Nose normal.  Mouth/Throat: Oropharynx is clear and moist.    Eyes: Conjunctivae are normal. Pupils are equal, round, and reactive to light. Right eye exhibits no discharge. Left eye exhibits no discharge.  Neck: Normal range of motion. Neck supple. No JVD present. No tracheal deviation present. No thyromegaly present.  Cardiovascular: Normal rate, regular rhythm and normal heart sounds.   Pulmonary/Chest: No stridor. No respiratory distress. She has no wheezes.  Abdominal: Soft. Bowel sounds are normal. She exhibits no distension and no mass. There is no tenderness. There is no rebound and no guarding.  Musculoskeletal: She exhibits no edema and no tenderness.  Lymphadenopathy:    She has no cervical adenopathy.  Neurological: She displays normal reflexes. No cranial nerve deficit. She exhibits normal muscle tone. Coordination normal.  Skin: Skin is warm. No rash noted. No erythema.  Psychiatric: Her behavior is normal. Judgment and thought content normal.       Depressed and tearful   Breast exam B did not reveal lumps (my exam)  Lab Results  Component Value Date   WBC 5.3 12/04/2010   HGB 10.6* 12/04/2010   HCT 32.7* 12/04/2010   PLT 212.0 12/04/2010   GLUCOSE 87 05/21/2011   CHOL 226* 06/15/2010   TRIG 213.0* 06/15/2010   HDL 62.60 06/15/2010   LDLDIRECT 131.9 06/15/2010   LDLCALC 99 05/20/2006   ALT 21 05/21/2011   AST 20 05/21/2011   NA 139 05/21/2011   K 3.9 05/21/2011   CL 108 05/21/2011   CREATININE 0.8 05/21/2011   BUN 12 05/21/2011   CO2 23 05/21/2011   TSH 1.32 09/01/2010   INR 0.9 09/01/2010   HGBA1C 8.1* 05/21/2011   MICROALBUR 0.9 03/22/2008  Assessment & Plan:   A complex case

## 2011-07-17 ENCOUNTER — Other Ambulatory Visit: Payer: Self-pay | Admitting: *Deleted

## 2011-07-17 DIAGNOSIS — F1120 Opioid dependence, uncomplicated: Secondary | ICD-10-CM | POA: Insufficient documentation

## 2011-07-17 MED ORDER — INSULIN GLULISINE 100 UNIT/ML ~~LOC~~ SOLN: SUBCUTANEOUS | Status: DC

## 2011-07-17 NOTE — Assessment & Plan Note (Signed)
5/13 She has been on prescription opioids only (no illicit drug use) for a long time. Se has not been able to have any quality of life w/o opioids due to her persistent migraines, neck pain and FMS. She is aware she needs to use as little as possible of her Rx. 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 She is also taking care of her sick husband who needs to be lifted).  She cont to smoke. There is no alcohol use.This is the best we can do at the moment with her meds. I would like her to see a pain specialist again.

## 2011-07-17 NOTE — Telephone Encounter (Signed)
R'cd fax from [facility] for refill of Apidra.

## 2011-07-17 NOTE — Assessment & Plan Note (Signed)
Continue with current prescription therapy as reflected on the Med list.  

## 2011-07-20 ENCOUNTER — Ambulatory Visit: Payer: BC Managed Care – PPO | Admitting: Internal Medicine

## 2011-07-20 ENCOUNTER — Other Ambulatory Visit: Payer: Self-pay | Admitting: *Deleted

## 2011-07-20 MED ORDER — PROCHLORPERAZINE MALEATE 10 MG PO TABS: 10.0000 mg | ORAL_TABLET | Freq: Four times a day (QID) | ORAL | Status: DC | PRN

## 2011-08-03 ENCOUNTER — Other Ambulatory Visit: Payer: Self-pay | Admitting: Internal Medicine

## 2011-08-07 ENCOUNTER — Ambulatory Visit: Payer: BC Managed Care – PPO | Admitting: *Deleted

## 2011-08-09 ENCOUNTER — Telehealth: Payer: Self-pay | Admitting: Internal Medicine

## 2011-08-09 ENCOUNTER — Ambulatory Visit (INDEPENDENT_AMBULATORY_CARE_PROVIDER_SITE_OTHER): Payer: BC Managed Care – PPO | Admitting: Family Medicine

## 2011-08-09 VITALS — BP 118/77 | HR 74 | Temp 98.4°F | Resp 16

## 2011-08-09 DIAGNOSIS — R269 Unspecified abnormalities of gait and mobility: Secondary | ICD-10-CM

## 2011-08-09 DIAGNOSIS — S51009A Unspecified open wound of unspecified elbow, initial encounter: Secondary | ICD-10-CM

## 2011-08-09 DIAGNOSIS — E109 Type 1 diabetes mellitus without complications: Secondary | ICD-10-CM

## 2011-08-09 DIAGNOSIS — Z23 Encounter for immunization: Secondary | ICD-10-CM

## 2011-08-09 DIAGNOSIS — S8000XA Contusion of unspecified knee, initial encounter: Secondary | ICD-10-CM

## 2011-08-09 DIAGNOSIS — S51019A Laceration without foreign body of unspecified elbow, initial encounter: Secondary | ICD-10-CM

## 2011-08-09 MED ORDER — CEPHALEXIN 500 MG PO CAPS: 500.0000 mg | ORAL_CAPSULE | Freq: Four times a day (QID) | ORAL | Status: AC

## 2011-08-09 NOTE — Telephone Encounter (Signed)
No available appointments with any MD. Consulted with MD and patient notified to seek ED or Urgent Care. Also patient given direction and information to [facility] who is also on Epic system.

## 2011-08-09 NOTE — Patient Instructions (Addendum)
Take antibiotic (keflex) twice daily with food  Return in 2 days for recheck since it was deep to the bone.  Return for suture removal 10 days.  WOUND CARE Please return in 10 days to have your stitches/staples removed or sooner if you have concerns. [name] Keep area clean and dry for 24 hours. Do not remove bandage, if applied. . After 24 hours, remove bandage and wash wound gently with mild soap and warm water. Reapply a new bandage after cleaning wound, if directed. . Continue daily cleansing with soap and water until stitches/staples are removed. . Do not apply any ointments or creams to the wound while stitches/staples are in place, as this may cause delayed healing. . Notify the office if you experience any of the following signs of infection: Swelling, redness, pus drainage, streaking, fever >101.0 F . Notify the office if you experience excessive bleeding that does not stop after 15-20 minutes of constant, firm pressure.

## 2011-08-09 NOTE — Progress Notes (Signed)
Procedure:  VCO  Lidocaine 2 % Plain injected into wound 4 cc with Marcaine 4 cc.  Sterile field and prep.  Wound explored.  Soft tissue deficit noted to reveal bony cortex. Wound irrigated with 30 cc sterile NACL.  Wound closed with #1 Subq, 4.0 Vicryl.  External closure with 4.0 Ethilon, # 6 HM/#1 SI.  Wound cleansed and dressed with telfa, kling and ACE wrap for compression. Patient tolerated this well.

## 2011-08-09 NOTE — Telephone Encounter (Signed)
Caller: Desarie/Patient; PCP: Sonda Primes; CB#: [phone]; ; ; Call regarding Injury/Trauma;  Patient states she fell while walking at approx. 0700 08/09/11. States, "my leg locked up and I was on the florr." States laceration noted to right elbow. States laceration is approx. 3/4 of an inch in length and is gaping. States laceration continues to bleed. Denies head injury or loss of consciousness. Denies any other injuries. Triage per Abrasions/Lacerations Protocol. Care advice given per guidelines. RN spoke with Mick Sell in office. States will speak with MD and return call to patient at [phone]. Patient advised to apply direct pressure and elevate extremity. Call back parameters reviewed. Patient verbalizes understanding.

## 2011-08-09 NOTE — Progress Notes (Signed)
Subjective: Patient got up and went to the other room to check her blood sugar this morning about 7:00. She tripped on the carpet and fell she did not think she hit anything, but must have. She cut her right elbow. It bled profusely. She put layers of packing dressing on it and finally got to stop bleeding, came in his afternoon checked. Her tetanus shot is not up to date. She does have cats. She says she might have gotten cat hair or  something in it by falling on the carpet.  Objective: Laceration on right elbow. This may go into the olecranon area.  Minimal abrasion on the right knee  Full range of motion of elbow  Impression: Wound elbow.  Knee contusion DM Polypharmacy ( may inrease risk of falls)  Plan:  Will need to see how deep it goes.   TDAP Will need to cover with antibiotics due to her cortisone and diabetes

## 2011-08-11 ENCOUNTER — Ambulatory Visit (INDEPENDENT_AMBULATORY_CARE_PROVIDER_SITE_OTHER): Payer: BC Managed Care – PPO | Admitting: Physician Assistant

## 2011-08-11 VITALS — BP 96/65 | HR 82 | Temp 98.3°F | Resp 16 | Ht 61.0 in | Wt 130.8 lb

## 2011-08-11 DIAGNOSIS — S51019A Laceration without foreign body of unspecified elbow, initial encounter: Secondary | ICD-10-CM

## 2011-08-11 DIAGNOSIS — S51009A Unspecified open wound of unspecified elbow, initial encounter: Secondary | ICD-10-CM

## 2011-08-11 NOTE — Progress Notes (Signed)
Patient ID: Lindsey Gomez MRN: [id number], DOB: February 09, 1946, 66 y.o. Date of Encounter: 08/11/2011, 5:55 PM  Primary Physician: Sonda Primes, MD, MD  Chief Complaint: Recheck elbow laceration  HPI: 66 y.o. year old female with history below presents for recheck right elbow laceration. Doing well. No issues or complaints. Afebrile. No chills. Patient had primary repair of the wound on 08/09/11 with 1 simple interrupted suture subcutaneously and 6 HM with 1 SI suture. Tolerating Keflex without difficulty. FROM with mild soreness with full flexion when she drinks her coffee. Normal sensation. Has not noticed any increased erythema or STS. Daily dressing changes.    Past Medical History  Diagnosis Date  . Depression   . Type II or unspecified type diabetes mellitus without mention of complication, not stated as uncontrolled   . HTN (hypertension)   . Chronic pain   . Fibromyalgia   . Migraines   . History of breast cancer     Right  . Chronic pancreatitis   . Vitamin d deficiency   . GERD (gastroesophageal reflux disease)   . Finger dislocation 2009    L 3rd  . COPD (chronic obstructive pulmonary disease)   . Anemia   . Fractured sternum 11/2008  . Osteoporosis     Reclast too expensive  . Barrett's esophagus   . Chronic fatigue   . Diverticulosis      Home Meds: Prior to Admission medications   Medication Sig Start Date End Date Taking? Authorizing Provider  ACCU-CHEK SOFTCLIX LANCETS lancets as directed. Use as instructed    Yes Historical Provider, MD  amitriptyline (ELAVIL) 50 MG tablet Take 0.5-1 tablets (25-50 mg total) by mouth at bedtime. 06/22/11  Yes Georgina Quint Plotnikov, MD  amLODipine-benazepril (LOTREL) 5-10 MG per capsule Take 1 capsule by mouth daily. 06/22/11  Yes Georgina Quint Plotnikov, MD  atenolol (TENORMIN) 100 MG tablet Take 1 tablet (100 mg total) by mouth every morning. 06/22/11  Yes Georgina Quint Plotnikov, MD  atorvastatin (LIPITOR) 10 MG tablet Take 1 tablet (10  mg total) by mouth daily. 06/22/11 06/24/12 Yes Georgina Quint Plotnikov, MD  calcitRIOL (ROCALTROL) 0.5 MCG capsule Take 1 capsule (0.5 mcg total) by mouth 2 (two) times daily. 10/30/10  Yes Georgina Quint Plotnikov, MD  cephALEXin (KEFLEX) 500 MG capsule Take 1 capsule (500 mg total) by mouth 4 (four) times daily. 08/09/11 08/19/11 Yes Peyton Najjar, MD  citalopram (CELEXA) 20 MG tablet Take 2 tablets (40 mg total) by mouth daily. 01/15/11 01/15/12 Yes Georgina Quint Plotnikov, MD  clotrimazole-betamethasone (LOTRISONE) cream Apply topically 2 (two) times daily. 03/30/11 03/29/12 Yes Georgina Quint Plotnikov, MD  diclofenac sodium (VOLTAREN) 1 % GEL Apply topically 4 (four) times daily as needed.     Yes Historical Provider, MD  glucose blood (ACCU-CHEK AVIVA PLUS) test strip 1 each by Other route 4 (four) times daily. And lancets 4/day 250.03 04/19/11  Yes Romero Belling, MD  hydrocortisone (CORTEF) 10 MG tablet Take 1 tablet (10 mg total) by mouth 2 (two) times daily. Morning and at lunch 09/01/10  Yes Georgina Quint Plotnikov, MD  insulin detemir (LEVEMIR) 100 UNIT/ML injection Inject 16 Units into the skin at bedtime.  07/18/10  Yes Georgina Quint Plotnikov, MD  insulin glulisine (APIDRA) 100 UNIT/ML injection 12 units with breakfast 8 units with lunch and 12 units with evening meal sq 07/17/11  Yes Romero Belling, MD  Insulin Pen Needle (B-D ULTRAFINE III SHORT PEN) 31G X 8 MM MISC Use as directed 5  times daily 04/26/11  Yes Romero Belling, MD  Ketorolac Tromethamine (SPRIX) 15.75 MG/SPRAY SOLN 1 spray by Nasal route 3 (three) times daily as needed.     Yes Historical Provider, MD  LORazepam (ATIVAN) 2 MG tablet Take 1 tablet (2 mg total) by mouth 3 (three) times daily as needed. 03/30/11  Yes Georgina Quint Plotnikov, MD  NASONEX 50 MCG/ACT nasal spray USE 1 OR 2 SPRAYS IN EACH NOSTRIL ONCE DAILY. 12/19/10  Yes Georgina Quint Plotnikov, MD  omeprazole (PRILOSEC) 40 MG capsule Take 1 capsule (40 mg total) by mouth 2 (two) times daily. 12/04/10 12/04/11 Yes  Georgina Quint Plotnikov, MD  oxyCODONE (OXYCONTIN) 20 MG 12 hr tablet Take 1 tablet (20 mg total) by mouth every 12 (twelve) hours. Please fill on or after  07/04/11 07/16/11  Yes Georgina Quint Plotnikov, MD  oxyCODONE (ROXICODONE) 15 MG immediate release tablet Take 1 tablet (15 mg total) by mouth as directed. 2 to 3 times daily as needed for pain 07/16/11  Yes [name], MD  prochlorperazine (COMPAZINE) 10 MG tablet Take 1 tablet (10 mg total) by mouth 4 (four) times daily as needed. For nausea 07/20/11  Yes Georgina Quint Plotnikov, MD  ranitidine (ZANTAC) 300 MG tablet TAKE 1 TABLET ONCE DAILY. 02/07/11  Yes Georgina Quint Plotnikov, MD  RELPAX 40 MG tablet TAKE 1 TABLET DAILY IF NEEDED FOR MIGRAINE. 08/03/11  Yes Georgina Quint Plotnikov, MD  TOPAMAX 200 MG tablet TAKE 1 TABLET TWICE DAILY. 06/14/11  Yes Georgina Quint Plotnikov, MD  triamcinolone (KENALOG) 0.1 % ointment Apply topically 2 (two) times daily as needed.     Yes Historical Provider, MD  ZOMIG 5 MG tablet TAKE 1 TABLET DAILY IF NEEDED FOR MIGRAINE. 06/03/11  Yes Georgina Quint Plotnikov, MD  tamoxifen (NOLVADEX) 20 MG tablet Take 20 mg by mouth 2 (two) times daily.      Historical Provider, MD    Allergies:  Allergies  Allergen Reactions  . Milnacipran     REACTION: HA and hand swelling  . Moxifloxacin   . Rizatriptan Benzoate     REACTION: n/v  . Sulfonamide Derivatives   . Venlafaxine     History   Social History  . Marital Status: Married    Spouse Name: [name]    Number of Children: N/A  . Years of Education: N/A   Occupational History  . 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 Smoking status: Current Everyday Smoker -- 2.0 packs/day  . Smokeless tobacco: Never Used  . Alcohol Use: No  . Drug Use: No  . Sexually Active: No   Other Topics Concern  . Not on file   Social History Narrative   Did not go back to work Oct 23, [name] is very ill - unable to have financial ends meet, no incomeRegular Exercise -   NOCoffee daily      Review of Systems: Constitutional: negative for chills, fever, night sweats, weight changes, or fatigue  HEENT: negative for vision changes, or hearing loss Cardiovascular: negative for chest pain or palpitations Respiratory: negative for hemoptysis, wheezing, shortness of breath, or cough Abdominal: negative for abdominal pain, nausea, vomiting, diarrhea, or constipation Dermatological: negative for rash Neurologic: negative for headache, dizziness, or syncope All other systems reviewed and are otherwise negative with the exception to those above and in the HPI.   Physical Exam: Blood pressure 88/52, pulse 82, temperature 98.3 F (36.8 C), temperature source  Oral, resp. rate 16, height 5\' 1"  (1.549 m), weight 130 lb 12.8 oz (59.33 kg)., Body mass index is 24.71 kg/(m^2). BP recheck 96/65 General: Well developed, well nourished, in no acute distress. Head: Normocephalic, atraumatic, eyes without discharge, sclera non-icteric, nares are without discharge.  Neck: Supple. No thyromegaly. Full ROM. No lymphadenopathy. Lungs: Clear bilaterally to auscultation without wheezes, rales, or rhonchi. Breathing is unlabored. Heart: RRR with S1 S2. No murmurs, rubs, or gallops appreciated. Msk:  Strength and tone normal for age. Extremities/Skin: See photo. Mild erythema along the sutures. No STS or signs of infection. Mild TTP along the wound. FROM. 5/5 strength. Normal sensation.  Neuro: Alert and oriented X 3. Moves all extremities spontaneously. Gait is normal. CNII-XII grossly in tact. Psych:  Responds to questions appropriately with a normal affect.     ASSESSMENT AND PLAN:  66 y.o. year old female with well healing laceration along right elbow -Well healing -Continue Keflex -Daily dressing changes -Suture removal 08/19/11 -RTC precautions  Signed, Eula Listen, PA-C 08/11/2011 5:55 PM

## 2011-08-16 ENCOUNTER — Ambulatory Visit (INDEPENDENT_AMBULATORY_CARE_PROVIDER_SITE_OTHER): Payer: BC Managed Care – PPO | Admitting: Endocrinology

## 2011-08-16 ENCOUNTER — Ambulatory Visit (INDEPENDENT_AMBULATORY_CARE_PROVIDER_SITE_OTHER): Payer: BC Managed Care – PPO | Admitting: Internal Medicine

## 2011-08-16 ENCOUNTER — Other Ambulatory Visit (INDEPENDENT_AMBULATORY_CARE_PROVIDER_SITE_OTHER): Payer: BC Managed Care – PPO

## 2011-08-16 ENCOUNTER — Encounter: Payer: Self-pay | Admitting: Endocrinology

## 2011-08-16 ENCOUNTER — Encounter: Payer: Self-pay | Admitting: Internal Medicine

## 2011-08-16 VITALS — BP 138/84 | HR 81 | Temp 97.3°F | Ht 62.0 in | Wt 130.0 lb

## 2011-08-16 DIAGNOSIS — E109 Type 1 diabetes mellitus without complications: Secondary | ICD-10-CM

## 2011-08-16 DIAGNOSIS — G43909 Migraine, unspecified, not intractable, without status migrainosus: Secondary | ICD-10-CM

## 2011-08-16 DIAGNOSIS — R42 Dizziness and giddiness: Secondary | ICD-10-CM

## 2011-08-16 DIAGNOSIS — G43109 Migraine with aura, not intractable, without status migrainosus: Secondary | ICD-10-CM

## 2011-08-16 DIAGNOSIS — IMO0001 Reserved for inherently not codable concepts without codable children: Secondary | ICD-10-CM

## 2011-08-16 DIAGNOSIS — G43819 Other migraine, intractable, without status migrainosus: Secondary | ICD-10-CM

## 2011-08-16 DIAGNOSIS — F411 Generalized anxiety disorder: Secondary | ICD-10-CM

## 2011-08-16 DIAGNOSIS — S41109A Unspecified open wound of unspecified upper arm, initial encounter: Secondary | ICD-10-CM

## 2011-08-16 LAB — HEMOGLOBIN A1C: Hgb A1c MFr Bld: 7.9 % — ABNORMAL HIGH (ref 4.6–6.5)

## 2011-08-16 MED ORDER — OXYCODONE HCL 15 MG PO TABS: 15.0000 mg | ORAL_TABLET | ORAL | Status: DC

## 2011-08-16 MED ORDER — OXYCODONE HCL 20 MG PO TB12: 20.0000 mg | ORAL_TABLET | Freq: Two times a day (BID) | ORAL | Status: DC

## 2011-08-16 NOTE — Assessment & Plan Note (Signed)
Continue with current prescription therapy as reflected on the Med list.  

## 2011-08-16 NOTE — Progress Notes (Signed)
Patient ID: Lindsey Gomez, female   DOB: 03-05-1946, 66 y.o.   MRN: [id number] Patient ID: Lindsey Gomez, female   DOB: [birth year], 66 y.o.   MRN: [id number]  Subjective:    Patient ID: Lindsey Gomez, female    DOB: 1945-05-19, 65 y.o.   MRN: [id number]  Migraine  Pertinent negatives include no abdominal pain, back pain, coughing, dizziness, nausea, numbness, sinus pressure or weakness.    She fell on 5/30 and had a laceration to L arm - had to have sutures   C/o severe HA's - migraines. She was vomiting this am w/migraine. C/o severe pain. C/o doing worse w/pain and requesting me to increase her pain meds.  The patient presents for a follow-up of chronic HAs, depression, anxiety, hypertension, chronic dyslipidemia, type 1 diabetes controlled with medicines.   Wt Readings from Last 3 Encounters:  08/16/11 130 lb (58.968 kg)  08/16/11 130 lb (58.968 kg)  08/11/11 130 lb 12.8 oz (59.33 kg)   BP Readings from Last 3 Encounters:  08/16/11 138/84  08/16/11 138/84  08/11/11 96/65      Review of Systems  Constitutional: Positive for fatigue. Negative for chills, activity change, appetite change and unexpected weight change.  HENT: Negative for congestion, mouth sores and sinus pressure.   Eyes: Negative for visual disturbance.  Respiratory: Negative for cough and chest tightness.   Gastrointestinal: Negative for nausea and abdominal pain.  Genitourinary: Negative for frequency, difficulty urinating and vaginal pain.  Musculoskeletal: Positive for arthralgias and gait problem. Negative for back pain.  Skin: Negative for pallor and rash.  Neurological: Positive for headaches. Negative for dizziness, tremors, weakness and numbness.  Psychiatric/Behavioral: Negative for suicidal ideas, confusion, sleep disturbance and self-injury. The patient is nervous/anxious.        Objective:   Physical Exam  Constitutional: She appears well-developed and well-nourished. No distress.  HENT:  Head:  Normocephalic.  Right Ear: External ear normal.  Left Ear: External ear normal.  Nose: Nose normal.  Mouth/Throat: Oropharynx is clear and moist.  Eyes: Conjunctivae are normal. Pupils are equal, round, and reactive to light. Right eye exhibits no discharge. Left eye exhibits no discharge.  Neck: Normal range of motion. Neck supple. No JVD present. No tracheal deviation present. No thyromegaly present.  Cardiovascular: Normal rate, regular rhythm and normal heart sounds.   Pulmonary/Chest: No stridor. No respiratory distress. She has no wheezes.  Abdominal: Soft. Bowel sounds are normal. She exhibits no distension and no mass. There is no tenderness. There is no rebound and no guarding.  Musculoskeletal: She exhibits no edema and no tenderness.  Lymphadenopathy:    She has no cervical adenopathy.  Neurological: She displays normal reflexes. No cranial nerve deficit. She exhibits normal muscle tone. Coordination normal.  Skin: Skin is warm. No rash noted. No erythema.  Psychiatric: Her behavior is normal. Judgment and thought content normal.       Depressed, not tearful  R elbow w/sutures  Lab Results  Component Value Date   WBC 5.3 12/04/2010   HGB 10.6* 12/04/2010   HCT 32.7* 12/04/2010   PLT 212.0 12/04/2010   GLUCOSE 87 05/21/2011   CHOL 226* 06/15/2010   TRIG 213.0* 06/15/2010   HDL 62.60 06/15/2010   LDLDIRECT 131.9 06/15/2010   LDLCALC 99 05/20/2006   ALT 21 05/21/2011   AST 20 05/21/2011   NA 139 05/21/2011   K 3.9 05/21/2011   CL 108 05/21/2011   CREATININE 0.8 05/21/2011   BUN 12 05/21/2011  CO2 23 05/21/2011   TSH 1.32 09/01/2010   INR 0.9 09/01/2010   HGBA1C 8.1* 05/21/2011   MICROALBUR 0.9 03/22/2008       Assessment & Plan:

## 2011-08-16 NOTE — Patient Instructions (Addendum)
continue levemir, 16 units at betime reduce apidra to 12 units with breakfast 7 units with lunch and 12 units with evening meal check your blood sugar 4 times a day--before the 3 meals, and at bedtime.  also check if you have symptoms of your blood sugar being too high or too low.  please keep a record of the readings and bring it to your next appointment here.  please call us sooner if your blood sugar goes below 70, or if it stays over 200. Please come back for a follow-up appointment in 3 months.  blood tests are being requested for you today.  You will receive a letter with results.

## 2011-08-16 NOTE — Assessment & Plan Note (Signed)
Recurrent Cut back on pain meds

## 2011-08-16 NOTE — Patient Instructions (Signed)
Nurse appt on Tue or Wed for suture removal

## 2011-08-16 NOTE — Assessment & Plan Note (Signed)
R arm 08/09/11 - elbow Suture removal next wk

## 2011-08-16 NOTE — Progress Notes (Signed)
Subjective:    Patient ID: Lindsey Gomez, female    DOB: Jul 27, 1945, 66 y.o.   MRN: [id number]  HPI Pt has h/o insulin-requiring DM (dx'ed 1990; no known complications).  It was dx'ed after a 40% pancreatectomy at [facility], for pancreatitis. she brings a record of her cbg's which i have reviewed today.  It varies from 35-250, but most are 100-200.  It is lowest in the afternoon, and highest at other times.  Past Medical History  Diagnosis Date  . Depression   . Type II or unspecified type diabetes mellitus without mention of complication, not stated as uncontrolled   . HTN (hypertension)   . Chronic pain   . Fibromyalgia   . Migraines   . History of breast cancer     Right  . Chronic pancreatitis   . Vitamin d deficiency   . GERD (gastroesophageal reflux disease)   . Finger dislocation 2009    L 3rd  . COPD (chronic obstructive pulmonary disease)   . Anemia   . Fractured sternum 11/2008  . Osteoporosis     Reclast too expensive  . Barrett's esophagus   . Chronic fatigue   . Diverticulosis     Past Surgical History  Procedure Date  . Nasal sinus surgery   . Pancreatectomy     40%  . Cholecystectomy   . Appendectomy   . Abdominal hysterectomy     History   Social History  . Marital Status: Married    Spouse Name: [name]    Number of Children: N/A  . Years of Education: N/A   Occupational History  . 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 Smoking status: Current Everyday Smoker -- 2.0 packs/day  . Smokeless tobacco: Never Used  . Alcohol Use: No  . Drug Use: No  . Sexually Active: No   Other Topics Concern  . Not on file   Social History Narrative   Did not go back to work Oct 23, [name] is very ill - unable to have financial ends meet, no incomeRegular Exercise -  NOCoffee daily     Current Outpatient Prescriptions on File Prior to Visit  Medication Sig Dispense Refill  . ACCU-CHEK SOFTCLIX LANCETS lancets as directed. Use as  instructed       . amitriptyline (ELAVIL) 50 MG tablet Take 0.5-1 tablets (25-50 mg total) by mouth at bedtime.  60 tablet  11  . amLODipine-benazepril (LOTREL) 5-10 MG per capsule Take 1 capsule by mouth daily.  30 capsule  11  . atenolol (TENORMIN) 100 MG tablet Take 1 tablet (100 mg total) by mouth every morning.  30 tablet  11  . atorvastatin (LIPITOR) 10 MG tablet Take 1 tablet (10 mg total) by mouth daily.  30 tablet  11  . calcitRIOL (ROCALTROL) 0.5 MCG capsule Take 1 capsule (0.5 mcg total) by mouth 2 (two) times daily.  60 capsule  11  . citalopram (CELEXA) 20 MG tablet Take 2 tablets (40 mg total) by mouth daily.  60 tablet  11  . clotrimazole-betamethasone (LOTRISONE) cream Apply topically 2 (two) times daily.  135 g  3  . diclofenac sodium (VOLTAREN) 1 % GEL Apply topically 4 (four) times daily as needed.        [name] glucose blood (ACCU-CHEK AVIVA PLUS) test strip 1 each by Other route 4 (four) times daily. And lancets 4/day 250.03  120 each  11  . hydrocortisone (  CORTEF) 10 MG tablet Take 1 tablet (10 mg total) by mouth 2 (two) times daily. Morning and at lunch  60 tablet  11  . Insulin Pen Needle (B-D ULTRAFINE III SHORT PEN) 31G X 8 MM MISC Use as directed 5 times daily  200 each  11  . Ketorolac Tromethamine (SPRIX) 15.75 MG/SPRAY SOLN 1 spray by Nasal route 3 (three) times daily as needed.        [name] LORazepam (ATIVAN) 2 MG tablet Take 1 tablet (2 mg total) by mouth 3 (three) times daily as needed.  90 tablet  5  . NASONEX 50 MCG/ACT nasal spray USE 1 OR 2 SPRAYS IN EACH NOSTRIL ONCE DAILY.  1 Inhaler  2  . omeprazole (PRILOSEC) 40 MG capsule Take 1 capsule (40 mg total) by mouth 2 (two) times daily.  60 capsule  11  . prochlorperazine (COMPAZINE) 10 MG tablet Take 1 tablet (10 mg total) by mouth 4 (four) times daily as needed. For nausea  30 tablet  3  . ranitidine (ZANTAC) 300 MG tablet TAKE 1 TABLET ONCE DAILY.  30 tablet  11  . RELPAX 40 MG tablet TAKE 1 TABLET DAILY IF NEEDED FOR  MIGRAINE.  8 each  5  . tamoxifen (NOLVADEX) 20 MG tablet Take 20 mg by mouth 2 (two) times daily.        . TOPAMAX 200 MG tablet TAKE 1 TABLET TWICE DAILY.  60 each  5  . triamcinolone (KENALOG) 0.1 % ointment Apply topically 2 (two) times daily as needed.        [name] ZOMIG 5 MG tablet TAKE 1 TABLET DAILY IF NEEDED FOR MIGRAINE.  8 each  2  . insulin detemir (LEVEMIR) 100 UNIT/ML injection Inject 16 Units into the skin at bedtime.  10 mL  3  . insulin glulisine (APIDRA) 100 UNIT/ML injection Inject subcutaneously12 units with breakfast 7 units with lunch and 12 units with evening meal  9 mL  5  . oxyCODONE (ROXICODONE) 15 MG immediate release tablet Take 1 tablet (15 mg total) by mouth as directed. 2 to 3 times daily as needed for pain  90 tablet  0   Current Facility-Administered Medications on File Prior to Visit  Medication Dose Route Frequency Provider Last Rate Last Dose  . 0.9 %  sodium chloride infusion  500 mL Intravenous Continuous Louis Meckel, MD        Allergies  Allergen Reactions  . Milnacipran     REACTION: HA and hand swelling  . Moxifloxacin   . Rizatriptan Benzoate     REACTION: n/v  . Sulfonamide Derivatives   . Venlafaxine    Family History  Problem Relation Age of Onset  . Colon cancer Neg Hx   . COPD Father   . Heart disease Father   . Leukemia Mother   . Diabetes Maternal Uncle    BP 138/84  Pulse 81  Temp 97.3 F (36.3 C) (Oral)  Ht 5\' 2"  (1.575 m)  Wt 130 lb (58.968 kg)  BMI 23.78 kg/m2  SpO2 97%  Review of Systems Denies LOC    Objective:   Physical Exam VITAL SIGNS:  See vs page GENERAL: no distress SKIN:  Insulin injection sites at the anterior thighs are normal  Lab Results  Component Value Date   HGBA1C 7.9* 08/16/2011      Assessment & Plan:  DM.  Based on the pattern of her cbg's, she needs some adjustment in her therapy

## 2011-08-17 ENCOUNTER — Ambulatory Visit: Payer: BC Managed Care – PPO | Admitting: Internal Medicine

## 2011-08-17 ENCOUNTER — Encounter: Payer: Self-pay | Admitting: Endocrinology

## 2011-08-17 LAB — MICROALBUMIN / CREATININE URINE RATIO
Microalb Creat Ratio: 1.5 mg/g (ref 0.0–30.0)
Microalb, Ur: 0.8 mg/dL (ref 0.0–1.9)

## 2011-08-19 ENCOUNTER — Encounter: Payer: Self-pay | Admitting: Internal Medicine

## 2011-08-20 ENCOUNTER — Telehealth: Payer: Self-pay | Admitting: *Deleted

## 2011-08-20 MED ORDER — INSULIN DETEMIR 100 UNIT/ML ~~LOC~~ SOLN: 16.0000 [IU] | Freq: Every day | SUBCUTANEOUS | Status: DC

## 2011-08-20 MED ORDER — INSULIN GLULISINE 100 UNIT/ML ~~LOC~~ SOLN: SUBCUTANEOUS | Status: DC

## 2011-08-20 NOTE — Telephone Encounter (Signed)
Called pt to inform of A1c results, pt informed (letter also mailed to pt). Pt needs rx for Levemir and Apidra sent to [facility] as well, both rx's sent.

## 2011-08-21 ENCOUNTER — Ambulatory Visit: Payer: BC Managed Care – PPO | Admitting: *Deleted

## 2011-08-22 ENCOUNTER — Ambulatory Visit: Payer: BC Managed Care – PPO

## 2011-08-27 ENCOUNTER — Other Ambulatory Visit: Payer: Self-pay | Admitting: Internal Medicine

## 2011-08-27 ENCOUNTER — Ambulatory Visit
Admission: RE | Admit: 2011-08-27 | Discharge: 2011-08-27 | Disposition: A | Discharge status: Home / Self Care | Payer: BC Managed Care – PPO | Source: Ambulatory Visit | Attending: Internal Medicine | Admitting: Internal Medicine

## 2011-08-27 DIAGNOSIS — N63 Unspecified lump in unspecified breast: Secondary | ICD-10-CM

## 2011-09-02 ENCOUNTER — Ambulatory Visit (INDEPENDENT_AMBULATORY_CARE_PROVIDER_SITE_OTHER): Payer: BC Managed Care – PPO | Admitting: Emergency Medicine

## 2011-09-02 VITALS — BP 109/73 | HR 73 | Temp 97.4°F | Resp 16 | Ht 61.0 in | Wt 126.4 lb

## 2011-09-02 DIAGNOSIS — Z4800 Encounter for change or removal of nonsurgical wound dressing: Secondary | ICD-10-CM

## 2011-09-02 DIAGNOSIS — Z4801 Encounter for change or removal of surgical wound dressing: Secondary | ICD-10-CM

## 2011-09-02 NOTE — Progress Notes (Signed)
  Subjective:    Patient ID: Lindsey Gomez, female    DOB: 03-May-1945, 66 y.o.   MRN: [id number]  HPI  Sutured here in May.  Sutures removed by FMD and wound subsequently dehisced.  One suture remains.   Review of Systems     Objective:   Physical Exam  No evidence infection.  Wound dehiscence and granulating nicely.      Assessment & Plan:  Remaining suture removed.  Watch for infection

## 2011-09-06 ENCOUNTER — Ambulatory Visit: Payer: BC Managed Care – PPO

## 2011-09-06 ENCOUNTER — Ambulatory Visit (INDEPENDENT_AMBULATORY_CARE_PROVIDER_SITE_OTHER): Payer: BC Managed Care – PPO | Admitting: Family Medicine

## 2011-09-06 VITALS — BP 110/72 | HR 75 | Temp 98.2°F | Resp 16 | Ht 62.0 in | Wt 124.0 lb

## 2011-09-06 DIAGNOSIS — W19XXXA Unspecified fall, initial encounter: Secondary | ICD-10-CM

## 2011-09-06 DIAGNOSIS — M1712 Unilateral primary osteoarthritis, left knee: Secondary | ICD-10-CM

## 2011-09-06 DIAGNOSIS — Z79899 Other long term (current) drug therapy: Secondary | ICD-10-CM

## 2011-09-06 DIAGNOSIS — T8130XA Disruption of wound, unspecified, initial encounter: Secondary | ICD-10-CM

## 2011-09-06 DIAGNOSIS — IMO0002 Reserved for concepts with insufficient information to code with codable children: Secondary | ICD-10-CM

## 2011-09-06 DIAGNOSIS — T8133XA Disruption of traumatic injury wound repair, initial encounter: Secondary | ICD-10-CM

## 2011-09-06 DIAGNOSIS — Z760 Encounter for issue of repeat prescription: Secondary | ICD-10-CM

## 2011-09-06 DIAGNOSIS — M1710 Unilateral primary osteoarthritis, unspecified knee: Secondary | ICD-10-CM

## 2011-09-06 DIAGNOSIS — R296 Repeated falls: Secondary | ICD-10-CM

## 2011-09-06 DIAGNOSIS — Z9181 History of falling: Secondary | ICD-10-CM

## 2011-09-06 DIAGNOSIS — M25529 Pain in unspecified elbow: Secondary | ICD-10-CM

## 2011-09-06 LAB — POCT CBC
Granulocyte percent: 49.3 %G (ref 37–80)
Hemoglobin: 10.6 g/dL — AB (ref 12.2–16.2)
MID (cbc): 1 — AB (ref 0–0.9)
MPV: 8 fL (ref 0–99.8)
POC MID %: 11.3 %M (ref 0–12)
Platelet Count, POC: 304 10*3/uL (ref 142–424)
RBC: 4.24 M/uL (ref 4.04–5.48)

## 2011-09-06 NOTE — Patient Instructions (Addendum)
Wear a knee brace  Consider getting a cane to help your balance.  Advised he speak with your primary care about going through each of your medicines, clarifying that you understand how you want to take your medicines and inured take your medicines, and try to determine what can be weaned off.  Return in one week for recheck.

## 2011-09-06 NOTE — Progress Notes (Signed)
Subjective: Patient says she fell and split open the elbow the been previously repaired. She is supposed to come back here, but her PCP remove the sutures after 9 days because it was stable. She says she's fallen about 10 times since then. She says her knee gives out falls. She certainly more with the falls. At the left knee the dizzy, right elbow. She's out along with the medications. She does not have her medicine bottles with her. She does not recall exactly which takes her medicines, but if this is a she takes in a.m. medicines. He takes p.m. without any set fashion, but she thinks she started taking it she does not take it again. She has a scar left knee and says she had surgery on it about 20 years ago when she was in her 31s.  Objective: She has a 2.5 CM wound in her right elbow which is widely gaping. There is a small hole into the olecranon bursal area. This is not inflamed. It is all old, with granulation developing along the sides of this wound. Not sure exactly when it gapped open again like this. The left knee has an old surgical scar on it. There is crepitance in the knee. No effusion. There is a little side to side ligamentous laxity.  Assessment: Left knee pain Right elbow pain Old wound dehiscence right elbow Polypharmacy Frequent falls, in part related to the medicines, in part due to the bad left knee DM  Plan: X-ray right knee and right elbow. [facility] reading (PRIMARY) by  Dr. Alwyn Ren Knee mild medial narrowing, no acute abnormality Elbow no change  Will need to keep the elbow clean and allow it to secondarily close him. I do not think any antibiotics are needed at this time, simply keep it clean and dressed. She should return in one week for this to be rechecked.  Discussed with her that she needs to have Dr. Beverely Low off go through all her medications and start saying what she can wean down from she needs a clear understanding of what she is on various medications for, which  she does not have at this time.Daphane Shepherd   Told her to bring in her medicines each time she comes. Talked about the need to be on less and less medicines rather than more and more.

## 2011-09-08 ENCOUNTER — Other Ambulatory Visit: Payer: Self-pay | Admitting: Internal Medicine

## 2011-09-12 ENCOUNTER — Ambulatory Visit (INDEPENDENT_AMBULATORY_CARE_PROVIDER_SITE_OTHER): Payer: BC Managed Care – PPO | Admitting: Internal Medicine

## 2011-09-12 ENCOUNTER — Telehealth: Payer: Self-pay | Admitting: *Deleted

## 2011-09-12 ENCOUNTER — Encounter: Payer: Self-pay | Admitting: Internal Medicine

## 2011-09-12 VITALS — BP 118/80 | HR 80 | Temp 97.6°F | Resp 16 | Wt 126.0 lb

## 2011-09-12 DIAGNOSIS — Z9181 History of falling: Secondary | ICD-10-CM

## 2011-09-12 DIAGNOSIS — G43819 Other migraine, intractable, without status migrainosus: Secondary | ICD-10-CM

## 2011-09-12 DIAGNOSIS — E109 Type 1 diabetes mellitus without complications: Secondary | ICD-10-CM

## 2011-09-12 DIAGNOSIS — S41109A Unspecified open wound of unspecified upper arm, initial encounter: Secondary | ICD-10-CM

## 2011-09-12 DIAGNOSIS — F1120 Opioid dependence, uncomplicated: Secondary | ICD-10-CM

## 2011-09-12 DIAGNOSIS — R296 Repeated falls: Secondary | ICD-10-CM

## 2011-09-12 DIAGNOSIS — I10 Essential (primary) hypertension: Secondary | ICD-10-CM

## 2011-09-12 DIAGNOSIS — F329 Major depressive disorder, single episode, unspecified: Secondary | ICD-10-CM

## 2011-09-12 MED ORDER — OXYCODONE HCL 20 MG PO TB12: 20.0000 mg | ORAL_TABLET | Freq: Two times a day (BID) | ORAL | Status: DC

## 2011-09-12 MED ORDER — LORAZEPAM 2 MG PO TABS: 2.0000 mg | ORAL_TABLET | Freq: Two times a day (BID) | ORAL | Status: DC | PRN

## 2011-09-12 MED ORDER — OXYCODONE HCL 15 MG PO TABS: 15.0000 mg | ORAL_TABLET | Freq: Two times a day (BID) | ORAL | Status: DC | PRN

## 2011-09-12 NOTE — Telephone Encounter (Signed)
What time of day was it low? 

## 2011-09-12 NOTE — Telephone Encounter (Signed)
Pt states that her CBG dropped to 25 last Sunday and wanted MD to be aware. She hasn't had any lows since then.

## 2011-09-12 NOTE — Progress Notes (Signed)
Subjective:    Patient ID: Lindsey Gomez, female    DOB: 1945-12-15, 66 y.o.   MRN: [id number]  Fall Associated symptoms include headaches. Pertinent negatives include no abdominal pain, nausea or numbness.  Migraine  Pertinent negatives include no abdominal pain, back pain, coughing, dizziness, nausea, numbness, sinus pressure or weakness.    She fell several times   [name] severe HA's - migraines. She was vomiting this am w/migraine. C/o severe pain. C/o doing worse w/pain and requesting me to increase her pain meds.  The patient presents for a follow-up of chronic HAs, depression, anxiety, hypertension, chronic dyslipidemia, type 1 diabetes controlled with medicines.   Wt Readings from Last 3 Encounters:  09/12/11 126 lb (57.153 kg)  09/06/11 124 lb (56.246 kg)  09/02/11 126 lb 6.4 oz (57.335 kg)   BP Readings from Last 3 Encounters:  09/12/11 118/80  09/06/11 110/72  09/02/11 109/73      Review of Systems  Constitutional: Positive for fatigue. Negative for chills, activity change, appetite change and unexpected weight change.  HENT: Negative for congestion, mouth sores and sinus pressure.   Eyes: Negative for visual disturbance.  Respiratory: Negative for cough and chest tightness.   Gastrointestinal: Negative for nausea and abdominal pain.  Genitourinary: Negative for frequency, difficulty urinating and vaginal pain.  Musculoskeletal: Positive for arthralgias and gait problem. Negative for back pain.  Skin: Negative for pallor and rash.  Neurological: Positive for headaches. Negative for dizziness, tremors, weakness and numbness.  Psychiatric/Behavioral: Negative for suicidal ideas, confusion, disturbed wake/sleep cycle and self-injury. The patient is nervous/anxious.        Objective:   Physical Exam  Constitutional: She appears well-developed and well-nourished. No distress.  HENT:  Head: Normocephalic.  Right Ear: External ear normal.  Left Ear: External ear  normal.  Nose: Nose normal.  Mouth/Throat: Oropharynx is clear and moist.  Eyes: Conjunctivae are normal. Pupils are equal, round, and reactive to light. Right eye exhibits no discharge. Left eye exhibits no discharge.  Neck: Normal range of motion. Neck supple. No JVD present. No tracheal deviation present. No thyromegaly present.  Cardiovascular: Normal rate, regular rhythm and normal heart sounds.   Pulmonary/Chest: No stridor. No respiratory distress. She has no wheezes.  Abdominal: Soft. Bowel sounds are normal. She exhibits no distension and no mass. There is no tenderness. There is no rebound and no guarding.  Musculoskeletal: She exhibits no edema and no tenderness.  Lymphadenopathy:    She has no cervical adenopathy.  Neurological: She displays normal reflexes. No cranial nerve deficit. She exhibits normal muscle tone. Coordination normal.  Skin: Skin is warm. No rash noted. No erythema.  Psychiatric: Her behavior is normal. Judgment and thought content normal.       Depressed, not tearful  R elbow w/a wound w/o sutures  Lab Results  Component Value Date   WBC 8.7 09/06/2011   HGB 10.6* 09/06/2011   HCT 35.4* 09/06/2011   PLT 212.0 12/04/2010   GLUCOSE 87 05/21/2011   CHOL 226* 06/15/2010   TRIG 213.0* 06/15/2010   HDL 62.60 06/15/2010   LDLDIRECT 131.9 06/15/2010   LDLCALC 99 05/20/2006   ALT 21 05/21/2011   AST 20 05/21/2011   NA 139 05/21/2011   K 3.9 05/21/2011   CL 108 05/21/2011   CREATININE 0.8 05/21/2011   BUN 12 05/21/2011   CO2 23 05/21/2011   TSH 1.32 09/01/2010   INR 0.9 09/01/2010   HGBA1C 7.9* 08/16/2011   MICROALBUR 0.8 08/16/2011  Assessment & Plan:

## 2011-09-14 ENCOUNTER — Ambulatory Visit (INDEPENDENT_AMBULATORY_CARE_PROVIDER_SITE_OTHER): Payer: BC Managed Care – PPO | Admitting: Family Medicine

## 2011-09-14 VITALS — BP 102/70 | HR 65 | Temp 98.2°F | Resp 18 | Ht 63.0 in | Wt 125.0 lb

## 2011-09-14 DIAGNOSIS — S51009A Unspecified open wound of unspecified elbow, initial encounter: Secondary | ICD-10-CM

## 2011-09-14 DIAGNOSIS — M25569 Pain in unspecified knee: Secondary | ICD-10-CM

## 2011-09-14 NOTE — Telephone Encounter (Signed)
Pt informed of MD's advisement regarding insulin adjustment.  

## 2011-09-14 NOTE — Progress Notes (Signed)
[facility]:  Office Visit  Chief Complaint:  Chief Complaint  Patient presents with  . Follow-up    right elbow and left knee    HPI: Lindsey Gomez is a 66 y.o. female who complains of : 1. Right elbow wound check-wound healing by secondary intention, healng well. She intitally had sutures put on primary elbow wound but fell and opened wound again and ? Dehisence. NOw doing better after oral abx.  2. Left knee pain-sensation of giving out, catches, instability x > 2 months. Xrays show chondrocalcinosis. No fx, sublux, soft tissue problems on 6/27. Patient was given knee brace. Patient has had multiple falls which she attributes to knee. However she is on a lot of dizzy inducing meds and narcotics as well which may be a contributing factor. She would like an ortho referral but will give [place] the name of the gorup her sister in law uses.   Past Medical History  Diagnosis Date  . Depression   . Type II or unspecified type diabetes mellitus without mention of complication, not stated as uncontrolled   . HTN (hypertension)   . Chronic pain   . Fibromyalgia   . Migraines   . History of breast cancer     Right  . Chronic pancreatitis   . Vitamin d deficiency   . GERD (gastroesophageal reflux disease)   . Finger dislocation 2009    L 3rd  . COPD (chronic obstructive pulmonary disease)   . Anemia   . Fractured sternum 11/2008  . Osteoporosis     Reclast too expensive  . Barrett's esophagus   . Chronic fatigue   . Diverticulosis    Past Surgical History  Procedure Date  . Nasal sinus surgery   . Pancreatectomy     40%  . Cholecystectomy   . Appendectomy   . Abdominal hysterectomy    History   Social History  . Marital Status: Married    Spouse Name: [name]    Number of Children: N/A  . Years of Education: N/A   Occupational History  . 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 Smoking status: Current Everyday Smoker -- 2.0  packs/day  . Smokeless tobacco: Never Used  . Alcohol Use: No  . Drug Use: No  . Sexually Active: No   Other Topics Concern  . None   Social History Narrative   Did not go back to work Oct 23, [name] is very ill - unable to have financial ends meet, no incomeRegular Exercise -  NOCoffee daily    Family History  Problem Relation Age of Onset  . Colon cancer Neg Hx   . COPD Father   . Heart disease Father   . Leukemia Mother   . Diabetes Maternal Uncle    Allergies  Allergen Reactions  . Milnacipran     REACTION: HA and hand swelling  . Moxifloxacin   . Rizatriptan Benzoate     REACTION: n/v  . Sulfonamide Derivatives   . Venlafaxine    Prior to Admission medications   Medication Sig Start Date End Date Taking? Authorizing Provider  amitriptyline (ELAVIL) 50 MG tablet Take 0.5-1 tablets (25-50 mg total) by mouth at bedtime. 06/22/11  Yes Georgina Quint Plotnikov, MD  amLODipine-benazepril (LOTREL) 5-10 MG per capsule Take 1 capsule by mouth daily. 06/22/11  Yes Georgina Quint Plotnikov, MD  atenolol (TENORMIN) 100 MG tablet Take 1 tablet (100  mg total) by mouth every morning. 06/22/11  Yes Georgina Quint Plotnikov, MD  atorvastatin (LIPITOR) 10 MG tablet Take 1 tablet (10 mg total) by mouth daily. 06/22/11 06/24/12 Yes Georgina Quint Plotnikov, MD  calcitRIOL (ROCALTROL) 0.5 MCG capsule Take 1 capsule (0.5 mcg total) by mouth 2 (two) times daily. 10/30/10  Yes Georgina Quint Plotnikov, MD  citalopram (CELEXA) 20 MG tablet Take 2 tablets (40 mg total) by mouth daily. 01/15/11 01/15/12 Yes Georgina Quint Plotnikov, MD  clotrimazole-betamethasone (LOTRISONE) cream Apply topically 2 (two) times daily. 03/30/11 03/29/12 Yes Georgina Quint Plotnikov, MD  diclofenac sodium (VOLTAREN) 1 % GEL Apply topically 4 (four) times daily as needed.     Yes Historical Provider, MD  glucose blood (ACCU-CHEK AVIVA PLUS) test strip 1 each by Other route 4 (four) times daily. And lancets 4/day 250.03 04/19/11  Yes Romero Belling, MD  hydrocortisone  (CORTEF) 10 MG tablet Take 1 tablet (10 mg total) by mouth 2 (two) times daily. Morning and at lunch 09/01/10  Yes Georgina Quint Plotnikov, MD  insulin detemir (LEVEMIR) 100 UNIT/ML injection Inject 16 Units into the skin at bedtime. 08/20/11  Yes Romero Belling, MD  insulin glulisine (APIDRA) 100 UNIT/ML injection Inject subcutaneously12 units with breakfast 7 units with lunch and 12 units with evening meal 08/20/11  Yes Romero Belling, MD  Insulin Pen Needle (B-D ULTRAFINE III SHORT PEN) 31G X 8 MM MISC Use as directed 5 times daily 04/26/11  Yes Romero Belling, MD  Ketorolac Tromethamine (SPRIX) 15.75 MG/SPRAY SOLN 1 spray by Nasal route 3 (three) times daily as needed.     Yes Historical Provider, MD  LORazepam (ATIVAN) 2 MG tablet Take 1 tablet (2 mg total) by mouth 2 (two) times daily as needed. 09/12/11  Yes Georgina Quint Plotnikov, MD  NASONEX 50 MCG/ACT nasal spray USE 1 OR 2 SPRAYS IN EACH NOSTRIL ONCE DAILY. 12/19/10  Yes Georgina Quint Plotnikov, MD  omeprazole (PRILOSEC) 40 MG capsule Take 1 capsule (40 mg total) by mouth 2 (two) times daily. 12/04/10 12/04/11 Yes Georgina Quint Plotnikov, MD  oxyCODONE (OXYCONTIN) 20 MG 12 hr tablet Take 1 tablet (20 mg total) by mouth every 12 (twelve) hours. 09/12/11  Yes Georgina Quint Plotnikov, MD  oxyCODONE (ROXICODONE) 15 MG immediate release tablet Take 1 tablet (15 mg total) by mouth 2 (two) times daily as needed for pain. 09/12/11  Yes [name], MD  prochlorperazine (COMPAZINE) 10 MG tablet Take 1 tablet (10 mg total) by mouth 4 (four) times daily as needed. For nausea 07/20/11  Yes Georgina Quint Plotnikov, MD  ranitidine (ZANTAC) 300 MG tablet TAKE 1 TABLET ONCE DAILY. 02/07/11  Yes Georgina Quint Plotnikov, MD  RELPAX 40 MG tablet TAKE 1 TABLET DAILY IF NEEDED FOR MIGRAINE. 08/03/11  Yes Georgina Quint Plotnikov, MD  tamoxifen (NOLVADEX) 20 MG tablet Take 20 mg by mouth 2 (two) times daily.     Yes Historical Provider, MD  TOPAMAX 200 MG tablet TAKE 1 TABLET TWICE DAILY. 06/14/11  Yes  Georgina Quint Plotnikov, MD  triamcinolone (KENALOG) 0.1 % ointment Apply topically 2 (two) times daily as needed.     Yes Historical Provider, MD  ZOMIG 5 MG tablet TAKE 1 TABLET DAILY IF NEEDED FOR MIGRAINE. 09/08/11  Yes [name], MD  ACCU-CHEK SOFTCLIX LANCETS lancets as directed. Use as instructed     Historical Provider, MD     ROS: The patient denies fevers, chills, night sweats, unintentional weight loss, chest pain, palpitations, wheezing, dyspnea  on exertion, nausea, vomiting, abdominal pain, dysuria, hematuria, melena, numbness, weakness, or tingling.   All other systems have been reviewed and were otherwise negative with the exception of those mentioned in the HPI and as above.    PHYSICAL EXAM: Filed Vitals:   09/14/11 1337  BP: 102/70  Pulse: 65  Temp: 98.2 F (36.8 C)  Resp: 18   Filed Vitals:   09/14/11 1337  Height: 5\' 3"  (1.6 m)  Weight: 125 lb (56.7 kg)   Body mass index is 22.14 kg/(m^2).  General: Alert, no acute distress HEENT:  Normocephalic, atraumatic, oropharynx patent.  Cardiovascular:  Regular rate and rhythm, no rubs murmurs or gallops.  No Carotid bruits, radial pulse intact. No pedal edema.  Respiratory: Clear to auscultation bilaterally.  No wheezes, rales, or rhonchi.  No cyanosis, no use of accessory musculature GI: No organomegaly, abdomen is soft and non-tender, positive bowel sounds.  No masses. Skin: + wound with granulation, healing well. No erythema.  Neurologic: Facial musculature symmetric. Psychiatric: Patient is appropriate throughout our interaction. Lymphatic: No cervical lymphadenopathy Musculoskeletal: Gait intact Left knee: no erythema, warmth, atrophy. Neg Lachman, neg Baker cyst. Mild diffuse tenderness, alng medial and lateral joint line. +/- McMurray. Radial pulses intact. ROM nl.     LABS: Results for orders placed in visit on 09/06/11  POCT CBC      Component Value Range   WBC 8.7  4.6 - 10.2 K/uL   Lymph, poc  3.4  0.6 - 3.4   POC LYMPH PERCENT 39.4  10 - 50 %L   MID (cbc) 1.0 (*) 0 - 0.9   POC MID % 11.3  0 - 12 %M   POC Granulocyte 4.3  2 - 6.9   Granulocyte percent 49.3  37 - 80 %G   RBC 4.24  4.04 - 5.48 M/uL   Hemoglobin 10.6 (*) 12.2 - 16.2 g/dL   HCT, POC 72.5 (*) 36.6 - 47.9 %   MCV 83.4  80 - 97 fL   MCH, POC 25.0 (*) 27 - 31.2 pg   MCHC 29.9 (*) 31.8 - 35.4 g/dL   RDW, POC 44.0     Platelet Count, POC 304  142 - 424 K/uL   MPV 8.0  0 - 99.8 fL     EKG/XRAY:   Primary read interpreted by Dr. Conley Rolls at [facility].   ASSESSMENT/PLAN: Encounter Diagnoses  Name Primary?  [name] Wound, open, elbow Yes  . Knee pain    Elbow wound healing well Knee pain with multiple falls-? Deconditioning of quadriceps vs Cartilage damage due to pop, click and sense of instability vs  polypharmacy.  Will refer to ortho group of choice when she gives [place] name of group ( nonurgent). Patient would like PT to be rx by ortho group.  Instructed on how to use brace adequately.     Hamilton Capri PHUONG, DO 09/14/2011 2:31 PM

## 2011-09-14 NOTE — Telephone Encounter (Signed)
In the evening around 10pm, per pt.

## 2011-09-14 NOTE — Telephone Encounter (Signed)
Decrease supper apidra to 11 units

## 2011-09-17 ENCOUNTER — Telehealth: Payer: Self-pay | Admitting: Internal Medicine

## 2011-09-17 NOTE — Telephone Encounter (Signed)
Pt's spouse informed of MD' advisement.

## 2011-09-17 NOTE — Telephone Encounter (Signed)
Caller: Lindsey Gomez/Patient; Phone Number: [phone]; Message from caller: Pt was told to call and give her BS readings from the weekend.  She is using Apidra insulin as instructed 12u in the am, 7u at lunch, 11u at dinner and Levemir insulin 16units at bedtime.  Sat.  09/15/11 fasting 206, lunch- 131 dinner-148 and bedtime- 66.  Sunday 09/16/11 Fasting-129, lunch-199, dinner- 195, bedtime-241. Today fasting 148.

## 2011-09-17 NOTE — Telephone Encounter (Signed)
Same rx Ret as sched

## 2011-09-18 ENCOUNTER — Ambulatory Visit: Payer: BC Managed Care – PPO | Admitting: Internal Medicine

## 2011-09-19 NOTE — Assessment & Plan Note (Signed)
Continue with current prescription therapy as reflected on the Med list.  

## 2011-09-19 NOTE — Assessment & Plan Note (Signed)
6/12 11/12, 7/13 Multifactorial: manual farm work (horse), meds side effects, debility, chronic ataxia - it is a difficult situation  Potential health risks of the above were explained to the patient and were aknowledged. Cont w/med dose reduction

## 2011-09-19 NOTE — Assessment & Plan Note (Signed)
5/13, 7/13 She has been on prescription opioids only (no illicit drug use ever) for a long time. Se has not been able to have any quality of life w/o opioids due to her persistent migraines, neck pain and FMS. She is aware she needs to use as little as possible of her Rx. 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 She is also taking care of her sick husband who needs to be lifted).  She cont to smoke. There is no alcohol use.This is the best we can do at the moment with her meds. I would like her to see a pain specialist again.  Cont w/slow dose reduction

## 2011-09-19 NOTE — Progress Notes (Signed)
  Subjective:    Patient ID: Lindsey Gomez, female    DOB: December 28, 1945, 66 y.o.   MRN: [id number]  HPI    Review of Systems     Objective:   Physical Exam  R elbow wound -- healing      Assessment & Plan:

## 2011-09-19 NOTE — Assessment & Plan Note (Signed)
Dressed the wound ACE

## 2011-10-01 NOTE — Telephone Encounter (Signed)
x

## 2011-10-03 ENCOUNTER — Encounter: Payer: Self-pay | Admitting: Internal Medicine

## 2011-10-03 ENCOUNTER — Ambulatory Visit (INDEPENDENT_AMBULATORY_CARE_PROVIDER_SITE_OTHER): Payer: BC Managed Care – PPO | Admitting: Internal Medicine

## 2011-10-03 VITALS — BP 108/70 | HR 80 | Temp 97.9°F | Resp 16 | Wt 125.0 lb

## 2011-10-03 DIAGNOSIS — R609 Edema, unspecified: Secondary | ICD-10-CM

## 2011-10-03 DIAGNOSIS — S41109A Unspecified open wound of unspecified upper arm, initial encounter: Secondary | ICD-10-CM

## 2011-10-03 DIAGNOSIS — M25562 Pain in left knee: Secondary | ICD-10-CM

## 2011-10-03 DIAGNOSIS — E109 Type 1 diabetes mellitus without complications: Secondary | ICD-10-CM

## 2011-10-03 DIAGNOSIS — Z9181 History of falling: Secondary | ICD-10-CM

## 2011-10-03 DIAGNOSIS — R296 Repeated falls: Secondary | ICD-10-CM

## 2011-10-03 DIAGNOSIS — M25569 Pain in unspecified knee: Secondary | ICD-10-CM

## 2011-10-03 MED ORDER — METHYLPREDNISOLONE ACETATE 80 MG/ML IJ SUSP: 40.0000 mg | Freq: Once | INTRAMUSCULAR | Status: DC

## 2011-10-03 MED ORDER — OXYCODONE HCL 20 MG PO TB12: 20.0000 mg | ORAL_TABLET | Freq: Two times a day (BID) | ORAL | Status: DC

## 2011-10-03 MED ORDER — OXYCODONE HCL 15 MG PO TABS: 15.0000 mg | ORAL_TABLET | Freq: Two times a day (BID) | ORAL | Status: DC | PRN

## 2011-10-03 NOTE — Assessment & Plan Note (Signed)
Discussed.

## 2011-10-03 NOTE — Progress Notes (Signed)
Subjective:    Patient ID: Lindsey Gomez, female    DOB: August 24, 1945, 66 y.o.   MRN: [id number]  Leg Pain  Pertinent negatives include no numbness.  Fall Associated symptoms include headaches. Pertinent negatives include no abdominal pain, nausea or numbness.  Migraine  Pertinent negatives include no abdominal pain, back pain, coughing, dizziness, nausea, numbness, sinus pressure or weakness.    She fell several times. C/o L knee pain - she had to take a few more oxycodones C/o R elbow wound   [name] severe HA's - migraines. She was vomiting this am w/migraine. C/o severe pain. C/o doing worse w/pain and requesting me to increase her pain meds.  The patient presents for a follow-up of chronic HAs, depression, anxiety, hypertension, chronic dyslipidemia, type 1 diabetes controlled with medicines.   Wt Readings from Last 3 Encounters:  10/03/11 125 lb (56.7 kg)  09/14/11 125 lb (56.7 kg)  09/12/11 126 lb (57.153 kg)   BP Readings from Last 3 Encounters:  10/03/11 108/70  09/14/11 102/70  09/12/11 118/80      Review of Systems  Constitutional: Positive for fatigue. Negative for chills, activity change, appetite change and unexpected weight change.  HENT: Negative for congestion, mouth sores and sinus pressure.   Eyes: Negative for visual disturbance.  Respiratory: Negative for cough and chest tightness.   Gastrointestinal: Negative for nausea and abdominal pain.  Genitourinary: Negative for frequency, difficulty urinating and vaginal pain.  Musculoskeletal: Positive for arthralgias and gait problem. Negative for back pain.  Skin: Negative for pallor and rash.  Neurological: Positive for headaches. Negative for dizziness, tremors, weakness and numbness.  Psychiatric/Behavioral: Negative for suicidal ideas, confusion, disturbed wake/sleep cycle and self-injury. The patient is nervous/anxious.        Objective:   Physical Exam  Constitutional: She appears well-developed and  well-nourished. No distress.  HENT:  Head: Normocephalic.  Right Ear: External ear normal.  Left Ear: External ear normal.  Nose: Nose normal.  Mouth/Throat: Oropharynx is clear and moist.  Eyes: Conjunctivae are normal. Pupils are equal, round, and reactive to light. Right eye exhibits no discharge. Left eye exhibits no discharge.  Neck: Normal range of motion. Neck supple. No JVD present. No tracheal deviation present. No thyromegaly present.  Cardiovascular: Normal rate, regular rhythm and normal heart sounds.   Pulmonary/Chest: No stridor. No respiratory distress. She has no wheezes.  Abdominal: Soft. Bowel sounds are normal. She exhibits no distension and no mass. There is no tenderness. There is no rebound and no guarding.  Musculoskeletal: She exhibits no edema and no tenderness.  Lymphadenopathy:    She has no cervical adenopathy.  Neurological: She displays normal reflexes. No cranial nerve deficit. She exhibits normal muscle tone. Coordination normal.  Skin: Skin is warm. No rash noted. No erythema.  Psychiatric: Her behavior is normal. Judgment and thought content normal.       Depressed, not tearful  R elbow w/a wound  L knee is w/o swelling, NT  Lab Results  Component Value Date   WBC 8.7 09/06/2011   HGB 10.6* 09/06/2011   HCT 35.4* 09/06/2011   PLT 212.0 12/04/2010   GLUCOSE 87 05/21/2011   CHOL 226* 06/15/2010   TRIG 213.0* 06/15/2010   HDL 62.60 06/15/2010   LDLDIRECT 131.9 06/15/2010   LDLCALC 99 05/20/2006   ALT 21 05/21/2011   AST 20 05/21/2011   NA 139 05/21/2011   K 3.9 05/21/2011   CL 108 05/21/2011   CREATININE 0.8 05/21/2011  BUN 12 05/21/2011   CO2 23 05/21/2011   TSH 1.32 09/01/2010   INR 0.9 09/01/2010   HGBA1C 7.9* 08/16/2011   MICROALBUR 0.8 08/16/2011    Procedure Note :     Procedure :Joint Injection, L  knee   Indication:  Joint osteoarthritis with refractory  chronic pain.   Risks including unsuccessful procedure , bleeding, infection, bruising, skin  atrophy and others were explained to the patient in detail as well as the benefits. Informed consent was obtained and signed.   Tthe patient was placed in a comfortable position. Medial approach was used. Skin was prepped with Betadine and alcohol  and anesthetized a cooling spray. Then, a 5 cc syringe with a 1.5 inch long 25-gauge needle was used for a joint injection.. The needle was advanced  Into the knee joint cavity. I aspirated a small amount of intra-articular fluid to confirm correct placement of the needle and injected the joint with 5 mL of 2% lidocaine and 40 mg of Depo-Medrol .  Band-Aid was applied.   Tolerated well. Complications: None. Good pain relief following the procedure.   Postprocedure instructions :    A Band-Aid should be left on for 12 hours. Injection therapy is not a cure itself. It is used in conjunction with other modalities. You can use nonsteroidal anti-inflammatories like ibuprofen , hot and cold compresses. Rest is recommended in the next 24 hours. You need to report immediately  if fever, chills or any signs of infection develop.     Assessment & Plan:

## 2011-10-03 NOTE — Assessment & Plan Note (Signed)
Better - resolving

## 2011-10-03 NOTE — Patient Instructions (Signed)
Postprocedure instructions :    A Band-Aid should be left on for 12 hours. Injection therapy is not a cure itself. It is used in conjunction with other modalities. You can use nonsteroidal anti-inflammatories like ibuprofen , hot and cold compresses. Rest is recommended in the next 24 hours. You need to report immediately  if fever, chills or any signs of infection develop. 

## 2011-10-03 NOTE — Assessment & Plan Note (Signed)
abx oint Occlusive dressing

## 2011-10-03 NOTE — Assessment & Plan Note (Signed)
Options to treat discussed She wants me to inject it -- will do

## 2011-10-03 NOTE — Assessment & Plan Note (Signed)
Continue with current prescription therapy as reflected on the Med list.  

## 2011-10-17 ENCOUNTER — Ambulatory Visit: Payer: BC Managed Care – PPO | Admitting: Internal Medicine

## 2011-10-19 ENCOUNTER — Other Ambulatory Visit: Payer: Self-pay | Admitting: *Deleted

## 2011-10-19 MED ORDER — PROCHLORPERAZINE MALEATE 10 MG PO TABS: 10.0000 mg | ORAL_TABLET | Freq: Four times a day (QID) | ORAL | Status: DC | PRN

## 2011-10-30 ENCOUNTER — Other Ambulatory Visit: Payer: Self-pay | Admitting: *Deleted

## 2011-10-30 MED ORDER — INSULIN DETEMIR 100 UNIT/ML ~~LOC~~ SOLN: 16.0000 [IU] | Freq: Every day | SUBCUTANEOUS | Status: DC

## 2011-10-30 NOTE — Telephone Encounter (Signed)
R'cd fax from [facility] for refill of [employer].

## 2011-10-31 ENCOUNTER — Telehealth: Payer: Self-pay

## 2011-10-31 NOTE — Telephone Encounter (Signed)
PT WOULD LIKE TO COME BY AND P/U A COPY OF HER XRAYS. HAVE AN APPT WITH [facility] ON Saturday. PLEASE CALL [phone]

## 2011-11-01 NOTE — Telephone Encounter (Signed)
Spoke with rodi--she will burn xray for pt.

## 2011-11-06 ENCOUNTER — Ambulatory Visit (INDEPENDENT_AMBULATORY_CARE_PROVIDER_SITE_OTHER): Payer: BC Managed Care – PPO | Admitting: Internal Medicine

## 2011-11-06 ENCOUNTER — Encounter: Payer: Self-pay | Admitting: Internal Medicine

## 2011-11-06 VITALS — BP 100/70 | HR 75 | Temp 97.8°F | Resp 16 | Wt 125.0 lb

## 2011-11-06 DIAGNOSIS — M25569 Pain in unspecified knee: Secondary | ICD-10-CM

## 2011-11-06 DIAGNOSIS — R5381 Other malaise: Secondary | ICD-10-CM

## 2011-11-06 DIAGNOSIS — F17200 Nicotine dependence, unspecified, uncomplicated: Secondary | ICD-10-CM

## 2011-11-06 DIAGNOSIS — E559 Vitamin D deficiency, unspecified: Secondary | ICD-10-CM

## 2011-11-06 DIAGNOSIS — K219 Gastro-esophageal reflux disease without esophagitis: Secondary | ICD-10-CM

## 2011-11-06 DIAGNOSIS — R296 Repeated falls: Secondary | ICD-10-CM

## 2011-11-06 DIAGNOSIS — F29 Unspecified psychosis not due to a substance or known physiological condition: Secondary | ICD-10-CM

## 2011-11-06 DIAGNOSIS — F3289 Other specified depressive episodes: Secondary | ICD-10-CM

## 2011-11-06 DIAGNOSIS — Z9181 History of falling: Secondary | ICD-10-CM

## 2011-11-06 DIAGNOSIS — M25562 Pain in left knee: Secondary | ICD-10-CM

## 2011-11-06 DIAGNOSIS — S41109A Unspecified open wound of unspecified upper arm, initial encounter: Secondary | ICD-10-CM

## 2011-11-06 DIAGNOSIS — G43909 Migraine, unspecified, not intractable, without status migrainosus: Secondary | ICD-10-CM

## 2011-11-06 DIAGNOSIS — F329 Major depressive disorder, single episode, unspecified: Secondary | ICD-10-CM

## 2011-11-06 DIAGNOSIS — R5383 Other fatigue: Secondary | ICD-10-CM

## 2011-11-06 DIAGNOSIS — E109 Type 1 diabetes mellitus without complications: Secondary | ICD-10-CM

## 2011-11-06 DIAGNOSIS — IMO0001 Reserved for inherently not codable concepts without codable children: Secondary | ICD-10-CM

## 2011-11-06 DIAGNOSIS — G43109 Migraine with aura, not intractable, without status migrainosus: Secondary | ICD-10-CM

## 2011-11-06 MED ORDER — OXYCODONE HCL 20 MG PO TB12: 20.0000 mg | ORAL_TABLET | Freq: Two times a day (BID) | ORAL | Status: DC

## 2011-11-06 MED ORDER — OXYCODONE HCL 15 MG PO TABS: 15.0000 mg | ORAL_TABLET | Freq: Two times a day (BID) | ORAL | Status: DC | PRN

## 2011-11-06 NOTE — Progress Notes (Signed)
   Subjective:    Patient ID: Lindsey Gomez, female    DOB: November 30, 1945, 66 y.o.   MRN: [id number]  HPI  She fell several times. C/o L knee pain - she had to take a few more oxycodones C/o R elbow wound   [name] severe HA's - migraines. She was vomiting this am w/migraine. C/o severe pain. C/o doing worse w/pain and requesting me to increase her pain meds.  The patient presents for a follow-up of chronic HAs, depression, anxiety, hypertension, chronic dyslipidemia, type 1 diabetes controlled with medicines.   Wt Readings from Last 3 Encounters:  11/06/11 125 lb (56.7 kg)  10/03/11 125 lb (56.7 kg)  09/14/11 125 lb (56.7 kg)   BP Readings from Last 3 Encounters:  11/06/11 100/70  10/03/11 108/70  09/14/11 102/70      Review of Systems  Constitutional: Positive for fatigue. Negative for chills, activity change, appetite change and unexpected weight change.  HENT: Negative for congestion and mouth sores.   Eyes: Negative for visual disturbance.  Respiratory: Negative for chest tightness.   Genitourinary: Negative for frequency, difficulty urinating and vaginal pain.  Musculoskeletal: Positive for arthralgias and gait problem.  Skin: Negative for pallor and rash.  Neurological: Negative for tremors.  Psychiatric/Behavioral: Negative for suicidal ideas, confusion, disturbed wake/sleep cycle and self-injury. The patient is nervous/anxious.        Objective:   Physical Exam  Constitutional: She appears well-developed and well-nourished. No distress.  HENT:  Head: Normocephalic.  Right Ear: External ear normal.  Left Ear: External ear normal.  Nose: Nose normal.  Mouth/Throat: Oropharynx is clear and moist.  Eyes: Conjunctivae are normal. Pupils are equal, round, and reactive to light. Right eye exhibits no discharge. Left eye exhibits no discharge.  Neck: Normal range of motion. Neck supple. No JVD present. No tracheal deviation present. No thyromegaly present.  Cardiovascular:  Normal rate, regular rhythm and normal heart sounds.   Pulmonary/Chest: No stridor. No respiratory distress. She has no wheezes.  Abdominal: Soft. Bowel sounds are normal. She exhibits no distension and no mass. There is no tenderness. There is no rebound and no guarding.  Musculoskeletal: She exhibits no edema and no tenderness.  Lymphadenopathy:    She has no cervical adenopathy.  Neurological: She displays normal reflexes. No cranial nerve deficit. She exhibits normal muscle tone. Coordination normal.  Skin: Skin is warm. No rash noted. No erythema.  Psychiatric: Her behavior is normal. Judgment and thought content normal.       Depressed, not tearful  R elbow w/a 4x6 mm scab L knee is w/o swelling a little tender  Lab Results  Component Value Date   WBC 8.7 09/06/2011   HGB 10.6* 09/06/2011   HCT 35.4* 09/06/2011   PLT 212.0 12/04/2010   GLUCOSE 87 05/21/2011   CHOL 226* 06/15/2010   TRIG 213.0* 06/15/2010   HDL 62.60 06/15/2010   LDLDIRECT 131.9 06/15/2010   LDLCALC 99 05/20/2006   ALT 21 05/21/2011   AST 20 05/21/2011   NA 139 05/21/2011   K 3.9 05/21/2011   CL 108 05/21/2011   CREATININE 0.8 05/21/2011   BUN 12 05/21/2011   CO2 23 05/21/2011   TSH 1.32 09/01/2010   INR 0.9 09/01/2010   HGBA1C 7.9* 08/16/2011   MICROALBUR 0.8 08/16/2011        Assessment & Plan:

## 2011-11-06 NOTE — Assessment & Plan Note (Signed)
[name] w/Dr Everardo All Continue with current prescription therapy as reflected on the Med list.

## 2011-11-06 NOTE — Assessment & Plan Note (Signed)
Continue with current prescription therapy as reflected on the Med list.  

## 2011-11-06 NOTE — Assessment & Plan Note (Signed)
Discussed - she is smoking.Marland KitchenMarland Kitchen

## 2011-11-06 NOTE — Assessment & Plan Note (Signed)
Doing well 

## 2011-11-06 NOTE — Assessment & Plan Note (Signed)
discussed

## 2011-11-06 NOTE — Assessment & Plan Note (Signed)
She had seen Ortho -- MRI is pending

## 2011-11-06 NOTE — Assessment & Plan Note (Signed)
Healing

## 2011-11-06 NOTE — Assessment & Plan Note (Signed)
Better  

## 2011-11-13 ENCOUNTER — Telehealth: Payer: Self-pay | Admitting: Endocrinology

## 2011-11-13 NOTE — Telephone Encounter (Signed)
Forward 1 page from [facility] to Dr. Romero Belling for review on 11-13-11 ym

## 2011-11-14 ENCOUNTER — Other Ambulatory Visit: Payer: Self-pay | Admitting: Internal Medicine

## 2011-11-20 ENCOUNTER — Ambulatory Visit (INDEPENDENT_AMBULATORY_CARE_PROVIDER_SITE_OTHER): Payer: BC Managed Care – PPO | Admitting: Endocrinology

## 2011-11-20 ENCOUNTER — Other Ambulatory Visit (INDEPENDENT_AMBULATORY_CARE_PROVIDER_SITE_OTHER): Payer: BC Managed Care – PPO

## 2011-11-20 ENCOUNTER — Ambulatory Visit (INDEPENDENT_AMBULATORY_CARE_PROVIDER_SITE_OTHER)
Admission: RE | Admit: 2011-11-20 | Discharge: 2011-11-20 | Disposition: A | Discharge status: Home / Self Care | Payer: BC Managed Care – PPO | Source: Ambulatory Visit | Attending: Endocrinology | Admitting: Endocrinology

## 2011-11-20 ENCOUNTER — Other Ambulatory Visit: Payer: Self-pay | Admitting: *Deleted

## 2011-11-20 ENCOUNTER — Encounter: Payer: Self-pay | Admitting: Endocrinology

## 2011-11-20 VITALS — BP 130/82 | HR 67 | Temp 96.9°F | Wt 124.0 lb

## 2011-11-20 DIAGNOSIS — R05 Cough: Secondary | ICD-10-CM

## 2011-11-20 DIAGNOSIS — E109 Type 1 diabetes mellitus without complications: Secondary | ICD-10-CM

## 2011-11-20 MED ORDER — CEFUROXIME AXETIL 250 MG PO TABS: 250.0000 mg | ORAL_TABLET | Freq: Two times a day (BID) | ORAL | Status: AC

## 2011-11-20 NOTE — Patient Instructions (Addendum)
reduce levemir to 12 units at bedtime continue apidra, 12 units with breakfast 7 units with lunch and 12 units with evening meal check your blood sugar 4 times a day--before the 3 meals, and at bedtime.  also check if you have symptoms of your blood sugar being too high or too low.  please keep a record of the readings and bring it to your next appointment here.  please call us sooner if your blood sugar goes below 70, or if it stays over 200. Please come back for a follow-up appointment in 3 months.  A blood test, and a chest-x-ray, are being requested for you today.  You will receive a letter with results. i have sent a prescription to your pharmacy, for an antibiotic pill.  here is a sample of "advair-100."  take 1 puff 2x a day.  rinse mouth after using.

## 2011-11-20 NOTE — Progress Notes (Signed)
Subjective:    Patient ID: Lindsey Gomez, female    DOB: [birth year], 66 y.o.   MRN: [id number]  HPI Pt has h/o insulin-requiring DM (dx'ed 1990; no known complications; dx'ed after a 40% pancreatectomy at [facility], for pancreatitis). she brings a record of her cbg's which i have reviewed today.  she had an episode of severe hypoglycemia recently, before breakfast.  cbg was 25. Pt states few weeks of moderate prod-quality cough in the chest, no assoc sob.  She has slight wheezing Past Medical History  Diagnosis Date  . Depression   . Type II or unspecified type diabetes mellitus without mention of complication, not stated as uncontrolled   . HTN (hypertension)   . Chronic pain   . Fibromyalgia   . Migraines   . History of breast cancer     Right  . Chronic pancreatitis   . Vitamin d deficiency   . GERD (gastroesophageal reflux disease)   . Finger dislocation 2009    L 3rd  . COPD (chronic obstructive pulmonary disease)   . Anemia   . Fractured sternum 11/2008  . Osteoporosis     Reclast too expensive  . Barrett's esophagus   . Chronic fatigue   . Diverticulosis     Past Surgical History  Procedure Date  . Nasal sinus surgery   . Pancreatectomy     40%  . Cholecystectomy   . Appendectomy   . Abdominal hysterectomy     History   Social History  . Marital Status: Married    Spouse Name: [name]    Number of Children: N/A  . Years of Education: N/A   Occupational History  . 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 Smoking status: Current Every Day Smoker -- 2.0 packs/day  . Smokeless tobacco: Never Used  . Alcohol Use: No  . Drug Use: No  . Sexually Active: No   Other Topics Concern  . Not on file   Social History Narrative   Did not go back to work Oct 23, [name] is very ill - unable to have financial ends meet, no incomeRegular Exercise -  NOCoffee daily     Current Outpatient Prescriptions on File Prior to Visit  Medication Sig  Dispense Refill  . ACCU-CHEK SOFTCLIX LANCETS lancets as directed. Use as instructed       . amitriptyline (ELAVIL) 50 MG tablet Take 0.5-1 tablets (25-50 mg total) by mouth at bedtime.  60 tablet  11  . amLODipine-benazepril (LOTREL) 5-10 MG per capsule Take 1 capsule by mouth daily.  30 capsule  11  . atenolol (TENORMIN) 100 MG tablet Take 1 tablet (100 mg total) by mouth every morning.  30 tablet  11  . atorvastatin (LIPITOR) 10 MG tablet Take 1 tablet (10 mg total) by mouth daily.  30 tablet  11  . citalopram (CELEXA) 20 MG tablet Take 2 tablets (40 mg total) by mouth daily.  60 tablet  11  . clotrimazole-betamethasone (LOTRISONE) cream Apply topically 2 (two) times daily.  135 g  3  . diclofenac sodium (VOLTAREN) 1 % GEL Apply topically 4 (four) times daily as needed.        [name] glucose blood (ACCU-CHEK AVIVA PLUS) test strip 1 each by Other route 4 (four) times daily. And lancets 4/day 250.03  120 each  11  . hydrocortisone (CORTEF) 10 MG tablet Take 1 tablet (10 mg total) by mouth 2 (two)  times daily. Morning and at lunch  60 tablet  11  . insulin detemir (LEVEMIR) 100 UNIT/ML injection Inject 12 Units into the skin at bedtime.      . insulin glulisine (APIDRA) 100 UNIT/ML injection Inject subcutaneously12 units with breakfast 7 units with lunch and 11 units with evening meal  9 mL  0  . Insulin Pen Needle (B-D ULTRAFINE III SHORT PEN) 31G X 8 MM MISC Use as directed 5 times daily  200 each  11  . Ketorolac Tromethamine (SPRIX) 15.75 MG/SPRAY SOLN 1 spray by Nasal route 3 (three) times daily as needed.        [name] LORazepam (ATIVAN) 2 MG tablet Take 1 tablet (2 mg total) by mouth 2 (two) times daily as needed.  60 tablet  3  . NASONEX 50 MCG/ACT nasal spray USE 1 OR 2 SPRAYS IN EACH NOSTRIL ONCE DAILY.  1 Inhaler  2  . omeprazole (PRILOSEC) 40 MG capsule Take 1 capsule (40 mg total) by mouth 2 (two) times daily.  60 capsule  11  . oxyCODONE (OXYCONTIN) 20 MG 12 hr tablet Take 1 tablet (20 mg  total) by mouth every 12 (twelve) hours.  60 tablet  0  . oxyCODONE (ROXICODONE) 15 MG immediate release tablet Take 1 tablet (15 mg total) by mouth 2 (two) times daily as needed for pain. Ok to fill on 11/11/11. Thx!  60 tablet  0  . prochlorperazine (COMPAZINE) 10 MG tablet Take 1 tablet (10 mg total) by mouth 4 (four) times daily as needed. For nausea  30 tablet  3  . ranitidine (ZANTAC) 300 MG tablet TAKE 1 TABLET ONCE DAILY.  30 tablet  11  . RELPAX 40 MG tablet TAKE 1 TABLET DAILY IF NEEDED FOR MIGRAINE.  8 each  5  . tamoxifen (NOLVADEX) 20 MG tablet Take 20 mg by mouth 2 (two) times daily.        . TOPAMAX 200 MG tablet TAKE 1 TABLET TWICE DAILY.  60 each  5  . triamcinolone (KENALOG) 0.1 % ointment Apply topically 2 (two) times daily as needed.        [name] ZOMIG 5 MG tablet TAKE 1 TABLET DAILY IF NEEDED FOR MIGRAINE.  8 each  3  . ROCALTROL 0.5 MCG capsule TAKE (1) CAPSULE TWICE DAILY.  60 each  5   Current Facility-Administered Medications on File Prior to Visit  Medication Dose Route Frequency Provider Last Rate Last Dose  . 0.9 %  sodium chloride infusion  500 mL Intravenous Continuous Louis Meckel, MD      . methylPREDNISolone acetate (DEPO-MEDROL) injection 40 mg  40 mg Intra-articular Once [name], MD        Allergies  Allergen Reactions  . Milnacipran     REACTION: HA and hand swelling  . Moxifloxacin   . Rizatriptan Benzoate     REACTION: n/v  . Sulfonamide Derivatives   . Venlafaxine     Family History  Problem Relation Age of Onset  . Colon cancer Neg Hx   . COPD Father   . Heart disease Father   . Leukemia Mother   . Diabetes Maternal Uncle     BP 130/82  Pulse 67  Temp 96.9 F (36.1 C) (Oral)  Wt 124 lb (56.246 kg)  SpO2 93%  Review of Systems She has lost weight; no fever    Objective:   Physical Exam VITAL SIGNS:  See vs page GENERAL: no distress LUNGS:  Clear to auscultation Lab Results  Component Value Date   HGBA1C 7.2*  11/20/2011      Assessment & Plan:  URI, new DM: Based on the pattern of her cbg's, she needs some adjustment in her therapy

## 2011-11-21 ENCOUNTER — Encounter: Payer: Self-pay | Admitting: Endocrinology

## 2011-11-22 ENCOUNTER — Telehealth: Payer: Self-pay | Admitting: *Deleted

## 2011-11-22 NOTE — Telephone Encounter (Signed)
Called pt to inform of lab results, pt informed (letter also mailed to pt). 

## 2011-12-03 ENCOUNTER — Telehealth: Payer: Self-pay | Admitting: *Deleted

## 2011-12-03 NOTE — Telephone Encounter (Signed)
decrease apidra to 10 units with breakfast 5 units with lunch and 10 units with evening meal. Decrease levemir to 10 units at bedtime Please come back for a follow-up appointment in 2-3 days

## 2011-12-03 NOTE — Telephone Encounter (Signed)
Caller left vm stating pt's BS dropped, she was unresponsive and EMS was called. She has appt sched tom with Everardo All but caller requests someone call pt today so she will know what to do.  She states she is afraid she will slip into a coma and die if she takes her meds. Please call pt with Dr. George Hugh advisement.

## 2011-12-04 ENCOUNTER — Ambulatory Visit (INDEPENDENT_AMBULATORY_CARE_PROVIDER_SITE_OTHER): Payer: BC Managed Care – PPO | Admitting: Endocrinology

## 2011-12-04 VITALS — BP 126/74 | HR 89 | Temp 97.3°F | Wt 123.0 lb

## 2011-12-04 DIAGNOSIS — E109 Type 1 diabetes mellitus without complications: Secondary | ICD-10-CM

## 2011-12-04 MED ORDER — GLUCAGON (RDNA) 1 MG IJ KIT: 1.0000 mg | PACK | Freq: Once | INTRAMUSCULAR | Status: DC | PRN

## 2011-12-04 NOTE — Progress Notes (Signed)
Subjective:    Patient ID: Lindsey Gomez, female    DOB: 1945-06-18, 66 y.o.   MRN: [id number]  HPI Pt has h/o insulin-requiring DM (dx'ed 1990; no known complications; dx'ed after a 40% pancreatectomy at [facility], for pancreatitis). she brings a record of her cbg's which i have reviewed today.  she had another episode of severe hypoglycemia recently, before breakfast.  cbg was in the 20's.     Past Medical History  Diagnosis Date  . Depression   . Type II or unspecified type diabetes mellitus without mention of complication, not stated as uncontrolled   . HTN (hypertension)   . Chronic pain   . Fibromyalgia   . Migraines   . History of breast cancer     Right  . Chronic pancreatitis   . Vitamin d deficiency   . GERD (gastroesophageal reflux disease)   . Finger dislocation 2009    L 3rd  . COPD (chronic obstructive pulmonary disease)   . Anemia   . Fractured sternum 11/2008  . Osteoporosis     Reclast too expensive  . Barrett's esophagus   . Chronic fatigue   . Diverticulosis     Past Surgical History  Procedure Date  . Nasal sinus surgery   . Pancreatectomy     40%  . Cholecystectomy   . Appendectomy   . Abdominal hysterectomy     History   Social History  . Marital Status: Married    Spouse Name: [name]    Number of Children: N/A  . Years of Education: N/A   Occupational History  . 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 Smoking status: Current Every Day Smoker -- 2.0 packs/day  . Smokeless tobacco: Never Used  . Alcohol Use: No  . Drug Use: No  . Sexually Active: No   Other Topics Concern  . Not on file   Social History Narrative   Did not go back to work Oct 23, [name] is very ill - unable to have financial ends meet, no incomeRegular Exercise -  NOCoffee daily     Current Outpatient Prescriptions on File Prior to Visit  Medication Sig Dispense Refill  . amLODipine-benazepril (LOTREL) 5-10 MG per capsule Take 1 capsule by mouth  daily.  30 capsule  11  . atenolol (TENORMIN) 100 MG tablet Take 1 tablet (100 mg total) by mouth every morning.  30 tablet  11  . atorvastatin (LIPITOR) 10 MG tablet Take 1 tablet (10 mg total) by mouth daily.  30 tablet  11  . citalopram (CELEXA) 20 MG tablet Take 2 tablets (40 mg total) by mouth daily.  60 tablet  11  . clotrimazole-betamethasone (LOTRISONE) cream Apply topically 2 (two) times daily.  135 g  3  . diclofenac sodium (VOLTAREN) 1 % GEL Apply topically 4 (four) times daily as needed.        [name] glucose blood (ACCU-CHEK AVIVA PLUS) test strip 1 each by Other route 4 (four) times daily. And lancets 4/day 250.03  120 each  11  . insulin detemir (LEVEMIR) 100 UNIT/ML injection Inject 8 Units into the skin at bedtime.       . insulin glulisine (APIDRA) 100 UNIT/ML injection Inject subcutaneously 10 units with breakfast 5 units with lunch and 10 units with evening meal  9 mL  0  . Insulin Pen Needle (B-D ULTRAFINE III SHORT PEN) 31G X 8 MM MISC Use as directed  5 times daily  200 each  11  . Ketorolac Tromethamine (SPRIX) 15.75 MG/SPRAY SOLN 1 spray by Nasal route 3 (three) times daily as needed.        [name] LORazepam (ATIVAN) 2 MG tablet Take 1 tablet (2 mg total) by mouth 2 (two) times daily as needed.  60 tablet  3  . NASONEX 50 MCG/ACT nasal spray USE 1 OR 2 SPRAYS IN EACH NOSTRIL ONCE DAILY.  1 Inhaler  2  . prochlorperazine (COMPAZINE) 10 MG tablet Take 1 tablet (10 mg total) by mouth 4 (four) times daily as needed. For nausea  30 tablet  3  . ranitidine (ZANTAC) 300 MG tablet TAKE 1 TABLET ONCE DAILY.  30 tablet  11  . RELPAX 40 MG tablet TAKE 1 TABLET DAILY IF NEEDED FOR MIGRAINE.  8 each  5  . ROCALTROL 0.5 MCG capsule TAKE (1) CAPSULE TWICE DAILY.  60 each  5  . tamoxifen (NOLVADEX) 20 MG tablet Take 20 mg by mouth 2 (two) times daily.        [name] triamcinolone (KENALOG) 0.1 % ointment Apply topically 2 (two) times daily as needed.        [name] ZOMIG 5 MG tablet TAKE 1 TABLET DAILY IF  NEEDED FOR MIGRAINE.  8 each  3  . amitriptyline (ELAVIL) 50 MG tablet Take 0.5-1 tablets (25-50 mg total) by mouth at bedtime.  60 tablet  5  . glucagon 1 MG injection Inject 1 mg into the vein once as needed.  1 each  12  . hydrocortisone (CORTEF) 10 MG tablet Take 1 tablet (10 mg total) by mouth 2 (two) times daily. Morning and at lunch  60 tablet  11  . omeprazole (PRILOSEC) 40 MG capsule Take 40 mg by mouth 2 (two) times daily.      [name] oxyCODONE (ROXICODONE) 15 MG immediate release tablet Take 1 tablet (15 mg total) by mouth 2 (two) times daily as needed for pain. Ok to fill on 12/11/11. Thx!  60 tablet  0  . topiramate (TOPAMAX) 200 MG tablet Take 1 tablet (200 mg total) by mouth 2 (two) times daily.  60 tablet  5   Current Facility-Administered Medications on File Prior to Visit  Medication Dose Route Frequency Provider Last Rate Last Dose  . 0.9 %  sodium chloride infusion  500 mL Intravenous Continuous Louis Meckel, MD      . methylPREDNISolone acetate (DEPO-MEDROL) injection 40 mg  40 mg Intra-articular Once [name], MD        Allergies  Allergen Reactions  . Milnacipran     REACTION: HA and hand swelling  . Moxifloxacin   . Rizatriptan Benzoate     REACTION: n/v  . Sulfonamide Derivatives   . Venlafaxine     Family History  Problem Relation Age of Onset  . Colon cancer Neg Hx   . COPD Father   . Heart disease Father   . Leukemia Mother   . Diabetes Maternal Uncle     BP 126/74  Pulse 89  Temp 97.3 F (36.3 C) (Oral)  Wt 123 lb (55.792 kg)    Review of Systems Denies weight change    Objective:   Physical Exam VITAL SIGNS:  See vs page GENERAL: no distress PSYCH: Alert and oriented x 3.  Does not appear anxious nor depressed.       Assessment & Plan:  DM: in view of severe hypoglycemia, she is not a candidate for  aggressive control Fibromyalgia.  oxycontin can mask the sxs of hypoglycemia.

## 2011-12-04 NOTE — Telephone Encounter (Signed)
Pt informed of MD's advisement regarding insulin adjustment.  

## 2011-12-04 NOTE — Patient Instructions (Addendum)
continue apidra, 10 units with breakfast 5 units with lunch and 10 units with evening meal. Decrease levemir to 8 units at bedtime.   i have sent a prescription to your pharmacy, for "glucagon."  This is a single-use emergency kit, to use in case of another episode of a severe low.  A person who has received this injection should be turned on their side, as this medication can cause vomiting.   Please come back for a follow-up appointment for 2 weeks.

## 2011-12-07 ENCOUNTER — Encounter: Payer: Self-pay | Admitting: Internal Medicine

## 2011-12-07 ENCOUNTER — Ambulatory Visit (INDEPENDENT_AMBULATORY_CARE_PROVIDER_SITE_OTHER): Payer: BC Managed Care – PPO | Admitting: Internal Medicine

## 2011-12-07 VITALS — BP 100/74 | HR 72 | Temp 98.2°F | Resp 16 | Wt 126.0 lb

## 2011-12-07 DIAGNOSIS — F329 Major depressive disorder, single episode, unspecified: Secondary | ICD-10-CM

## 2011-12-07 DIAGNOSIS — Z23 Encounter for immunization: Secondary | ICD-10-CM

## 2011-12-07 DIAGNOSIS — IMO0001 Reserved for inherently not codable concepts without codable children: Secondary | ICD-10-CM

## 2011-12-07 DIAGNOSIS — G43909 Migraine, unspecified, not intractable, without status migrainosus: Secondary | ICD-10-CM

## 2011-12-07 DIAGNOSIS — I10 Essential (primary) hypertension: Secondary | ICD-10-CM

## 2011-12-07 DIAGNOSIS — S41109A Unspecified open wound of unspecified upper arm, initial encounter: Secondary | ICD-10-CM

## 2011-12-07 DIAGNOSIS — G43109 Migraine with aura, not intractable, without status migrainosus: Secondary | ICD-10-CM

## 2011-12-07 DIAGNOSIS — K219 Gastro-esophageal reflux disease without esophagitis: Secondary | ICD-10-CM

## 2011-12-07 MED ORDER — TOPIRAMATE 200 MG PO TABS: 200.0000 mg | ORAL_TABLET | Freq: Two times a day (BID) | ORAL | Status: DC

## 2011-12-07 MED ORDER — HYDROCORTISONE 10 MG PO TABS: 10.0000 mg | ORAL_TABLET | Freq: Two times a day (BID) | ORAL | Status: DC

## 2011-12-07 MED ORDER — ACCU-CHEK SOFTCLIX LANCETS MISC: Status: DC

## 2011-12-07 MED ORDER — OXYCODONE HCL 20 MG PO TB12: 20.0000 mg | ORAL_TABLET | Freq: Two times a day (BID) | ORAL | Status: DC

## 2011-12-07 MED ORDER — OXYCODONE HCL 15 MG PO TABS: 15.0000 mg | ORAL_TABLET | Freq: Two times a day (BID) | ORAL | Status: DC | PRN

## 2011-12-07 MED ORDER — AMITRIPTYLINE HCL 50 MG PO TABS: 25.0000 mg | ORAL_TABLET | Freq: Every day | ORAL | Status: DC

## 2011-12-07 NOTE — Progress Notes (Signed)
   Subjective:    Patient ID: Lindsey Gomez, female    DOB: 19-Oct-1945, 66 y.o.   MRN: [id number]  HPI  C/o low CBG - it was 27 once and [name] had to call 911. She saw DR Everardo All - he reduced her Insulins  [name] severe HA's - migraines. She was vomiting this am w/migraine. C/o severe pain. C/o doing worse w/pain and requesting me to increase her pain meds.  The patient presents for a follow-up of chronic HAs, depression, anxiety, hypertension, chronic dyslipidemia, type 1 diabetes.  Wt Readings from Last 3 Encounters:  12/07/11 126 lb (57.153 kg)  12/04/11 123 lb (55.792 kg)  11/20/11 124 lb (56.246 kg)   BP Readings from Last 3 Encounters:  12/07/11 100/74  12/04/11 126/74  11/20/11 130/82      Review of Systems  Constitutional: Positive for fatigue. Negative for chills, activity change, appetite change and unexpected weight change.  HENT: Negative for congestion and mouth sores.   Eyes: Negative for visual disturbance.  Respiratory: Negative for chest tightness.   Genitourinary: Negative for frequency, difficulty urinating and vaginal pain.  Musculoskeletal: Positive for arthralgias and gait problem.  Skin: Negative for pallor and rash.  Neurological: Negative for tremors.  Psychiatric/Behavioral: Negative for suicidal ideas, confusion, disturbed wake/sleep cycle and self-injury. The patient is nervous/anxious.        Objective:   Physical Exam  Constitutional: She appears well-developed and well-nourished. No distress.  HENT:  Head: Normocephalic.  Right Ear: External ear normal.  Left Ear: External ear normal.  Nose: Nose normal.  Mouth/Throat: Oropharynx is clear and moist.  Eyes: Conjunctivae normal are normal. Pupils are equal, round, and reactive to light. Right eye exhibits no discharge. Left eye exhibits no discharge.  Neck: Normal range of motion. Neck supple. No JVD present. No tracheal deviation present. No thyromegaly present.  Cardiovascular: Normal rate,  regular rhythm and normal heart sounds.   Pulmonary/Chest: No stridor. No respiratory distress. She has no wheezes.  Abdominal: Soft. Bowel sounds are normal. She exhibits no distension and no mass. There is no tenderness. There is no rebound and no guarding.  Musculoskeletal: She exhibits no edema and no tenderness.  Lymphadenopathy:    She has no cervical adenopathy.  Neurological: She displays normal reflexes. No cranial nerve deficit. She exhibits normal muscle tone. Coordination normal.  Skin: Skin is warm. No rash noted. No erythema.  Psychiatric: Her behavior is normal. Judgment and thought content normal.       Depressed, not tearful  R elbow no scab L knee is w/o swelling a little tender  Lab Results  Component Value Date   WBC 8.7 09/06/2011   HGB 10.6* 09/06/2011   HCT 35.4* 09/06/2011   PLT 212.0 12/04/2010   GLUCOSE 87 05/21/2011   CHOL 226* 06/15/2010   TRIG 213.0* 06/15/2010   HDL 62.60 06/15/2010   LDLDIRECT 131.9 06/15/2010   LDLCALC 99 05/20/2006   ALT 21 05/21/2011   AST 20 05/21/2011   NA 139 05/21/2011   K 3.9 05/21/2011   CL 108 05/21/2011   CREATININE 0.8 05/21/2011   BUN 12 05/21/2011   CO2 23 05/21/2011   TSH 1.32 09/01/2010   INR 0.9 09/01/2010   HGBA1C 7.2* 11/20/2011   MICROALBUR 0.8 08/16/2011        Assessment & Plan:

## 2011-12-09 ENCOUNTER — Encounter: Payer: Self-pay | Admitting: Internal Medicine

## 2011-12-09 NOTE — Assessment & Plan Note (Signed)
Continue with current prescription therapy as reflected on the Med list.  

## 2011-12-09 NOTE — Assessment & Plan Note (Signed)
Healed

## 2011-12-13 ENCOUNTER — Telehealth: Payer: Self-pay | Admitting: *Deleted

## 2011-12-13 NOTE — Telephone Encounter (Signed)
PA for omeprazole is approved 11/22/11- 12/12/12. Case Id# [id number]. Pharmacy informed.

## 2011-12-18 ENCOUNTER — Encounter: Payer: Self-pay | Admitting: Endocrinology

## 2011-12-18 ENCOUNTER — Ambulatory Visit (INDEPENDENT_AMBULATORY_CARE_PROVIDER_SITE_OTHER): Payer: BC Managed Care – PPO | Admitting: Endocrinology

## 2011-12-18 VITALS — BP 122/72 | HR 73 | Temp 98.0°F | Wt 124.0 lb

## 2011-12-18 DIAGNOSIS — E109 Type 1 diabetes mellitus without complications: Secondary | ICD-10-CM

## 2011-12-18 NOTE — Patient Instructions (Addendum)
decrease apidra to 10 units with breakfast 5 units with lunch and 8 units with evening meal. continue levemir, 8 units at bedtime.  check your blood sugar 4 times a day--before the 3 meals, and at bedtime.  also check if you have symptoms of your blood sugar being too high or too low.  please keep a record of the readings and bring it to your next appointment here.  please call us sooner if your blood sugar goes below 70, or if you have a lot of readings over 200.   Please come back for a follow-up appointment in 2-4 weeks.
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
## 2011-12-20 ENCOUNTER — Telehealth: Payer: Self-pay | Admitting: *Deleted

## 2011-12-20 NOTE — Telephone Encounter (Signed)
Rec fax frm pharmacy stating pt's omeprazole 40 mg  1 bid is not approved for bid dosing. I called [phone] and spoke to [facility] who informed me that the med is approved for bid dosing. Pharmacy informed.

## 2012-01-08 ENCOUNTER — Ambulatory Visit (INDEPENDENT_AMBULATORY_CARE_PROVIDER_SITE_OTHER): Payer: BC Managed Care – PPO | Admitting: Internal Medicine

## 2012-01-08 ENCOUNTER — Encounter: Payer: Self-pay | Admitting: Internal Medicine

## 2012-01-08 VITALS — BP 130/80 | HR 80 | Temp 97.6°F | Resp 16 | Wt 121.0 lb

## 2012-01-08 DIAGNOSIS — E109 Type 1 diabetes mellitus without complications: Secondary | ICD-10-CM

## 2012-01-08 DIAGNOSIS — R51 Headache: Secondary | ICD-10-CM

## 2012-01-08 DIAGNOSIS — G43819 Other migraine, intractable, without status migrainosus: Secondary | ICD-10-CM

## 2012-01-08 DIAGNOSIS — Z23 Encounter for immunization: Secondary | ICD-10-CM

## 2012-01-08 DIAGNOSIS — F329 Major depressive disorder, single episode, unspecified: Secondary | ICD-10-CM

## 2012-01-08 DIAGNOSIS — K219 Gastro-esophageal reflux disease without esophagitis: Secondary | ICD-10-CM

## 2012-01-08 DIAGNOSIS — IMO0001 Reserved for inherently not codable concepts without codable children: Secondary | ICD-10-CM

## 2012-01-08 MED ORDER — OXYCODONE HCL 20 MG PO TB12: 20.0000 mg | ORAL_TABLET | Freq: Two times a day (BID) | ORAL | Status: DC

## 2012-01-08 MED ORDER — ESCITALOPRAM OXALATE 20 MG PO TABS: 20.0000 mg | ORAL_TABLET | Freq: Every day | ORAL | Status: DC

## 2012-01-08 MED ORDER — CITALOPRAM HYDROBROMIDE 20 MG PO TABS: 40.0000 mg | ORAL_TABLET | Freq: Every day | ORAL | Status: DC

## 2012-01-08 MED ORDER — OXYCODONE HCL 15 MG PO TABS: 15.0000 mg | ORAL_TABLET | Freq: Two times a day (BID) | ORAL | Status: DC | PRN

## 2012-01-08 MED ORDER — LORAZEPAM 2 MG PO TABS: 2.0000 mg | ORAL_TABLET | Freq: Two times a day (BID) | ORAL | Status: DC | PRN

## 2012-01-08 NOTE — Assessment & Plan Note (Signed)
Continue with current prescription therapy as reflected on the Med list.  

## 2012-01-08 NOTE — Progress Notes (Signed)
   Subjective:    Patient ID: Lindsey Gomez, female    DOB: 1945-08-02, 66 y.o.   MRN: [id number]  HPI  [name] on OA. She is seeing DR Everardo All   [name] severe HA's - migraines. She was vomiting this am w/migraine. C/o severe pain. C/o doing worse w/pain and requesting me to increase her pain meds.  The patient presents for a follow-up of chronic HAs, depression, anxiety, hypertension, chronic dyslipidemia, type 1 diabetes.  Wt Readings from Last 3 Encounters:  01/08/12 121 lb (54.885 kg)  12/18/11 124 lb (56.246 kg)  12/07/11 126 lb (57.153 kg)   BP Readings from Last 3 Encounters:  01/08/12 130/80  12/18/11 122/72  12/07/11 100/74      Review of Systems  Constitutional: Positive for fatigue. Negative for chills, activity change, appetite change and unexpected weight change.  HENT: Negative for congestion and mouth sores.   Eyes: Negative for visual disturbance.  Respiratory: Negative for chest tightness.   Genitourinary: Negative for frequency, difficulty urinating and vaginal pain.  Musculoskeletal: Positive for arthralgias and gait problem.  Skin: Negative for pallor and rash.  Neurological: Negative for tremors.  Psychiatric/Behavioral: Negative for suicidal ideas, confusion, disturbed wake/sleep cycle and self-injury. The patient is nervous/anxious.        Objective:   Physical Exam  Constitutional: She appears well-developed and well-nourished. No distress.  HENT:  Head: Normocephalic.  Right Ear: External ear normal.  Left Ear: External ear normal.  Nose: Nose normal.  Mouth/Throat: Oropharynx is clear and moist.  Eyes: Conjunctivae normal are normal. Pupils are equal, round, and reactive to light. Right eye exhibits no discharge. Left eye exhibits no discharge.  Neck: Normal range of motion. Neck supple. No JVD present. No tracheal deviation present. No thyromegaly present.  Cardiovascular: Normal rate, regular rhythm and normal heart sounds.   Pulmonary/Chest: No  stridor. No respiratory distress. She has no wheezes.  Abdominal: Soft. Bowel sounds are normal. She exhibits no distension and no mass. There is no tenderness. There is no rebound and no guarding.  Musculoskeletal: She exhibits no edema and no tenderness.  Lymphadenopathy:    She has no cervical adenopathy.  Neurological: She displays normal reflexes. No cranial nerve deficit. She exhibits normal muscle tone. Coordination normal.  Skin: Skin is warm. No rash noted. No erythema.  Psychiatric: Her behavior is normal. Judgment and thought content normal.       Depressed, not tearful   L knee is w/o swelling a little tender  Lab Results  Component Value Date   WBC 8.7 09/06/2011   HGB 10.6* 09/06/2011   HCT 35.4* 09/06/2011   PLT 212.0 12/04/2010   GLUCOSE 87 05/21/2011   CHOL 226* 06/15/2010   TRIG 213.0* 06/15/2010   HDL 62.60 06/15/2010   LDLDIRECT 131.9 06/15/2010   LDLCALC 99 05/20/2006   ALT 21 05/21/2011   AST 20 05/21/2011   NA 139 05/21/2011   K 3.9 05/21/2011   CL 108 05/21/2011   CREATININE 0.8 05/21/2011   BUN 12 05/21/2011   CO2 23 05/21/2011   TSH 1.32 09/01/2010   INR 0.9 09/01/2010   HGBA1C 7.2* 11/20/2011   MICROALBUR 0.8 08/16/2011        Assessment & Plan:

## 2012-01-09 ENCOUNTER — Ambulatory Visit (INDEPENDENT_AMBULATORY_CARE_PROVIDER_SITE_OTHER): Payer: BC Managed Care – PPO | Admitting: Endocrinology

## 2012-01-09 ENCOUNTER — Encounter: Payer: Self-pay | Admitting: Endocrinology

## 2012-01-09 VITALS — BP 118/80 | HR 69 | Temp 97.6°F | Wt 121.0 lb

## 2012-01-09 DIAGNOSIS — E109 Type 1 diabetes mellitus without complications: Secondary | ICD-10-CM

## 2012-01-09 NOTE — Patient Instructions (Addendum)
continue apidra, 10 units with breakfast 5 units with lunch and 8 units with evening meal. continue levemir, 8 units at bedtime.  check your blood sugar 4 times a day--before the 3 meals, and at bedtime.  also check if you have symptoms of your blood sugar being too high or too low.  please keep a record of the readings and bring it to your next appointment here.  please call us sooner if your blood sugar goes below 70, or if you have a lot of readings over 200.   Please come back for a follow-up appointment in 3 months.
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
## 2012-01-11 ENCOUNTER — Telehealth: Payer: Self-pay

## 2012-01-11 NOTE — Telephone Encounter (Signed)
Take at HS. Take 1/2 tab/d. Advance to 1 tab qhs if tol in 1-2 wks Thx

## 2012-01-11 NOTE — Telephone Encounter (Signed)
Pt advised and expressed understanding.

## 2012-01-11 NOTE — Telephone Encounter (Signed)
Pt called stating Lexapro is causing excessive drowsiness. Pt is requesting MD advisement, okay to take QHS?

## 2012-01-16 ENCOUNTER — Other Ambulatory Visit: Payer: Self-pay | Admitting: Internal Medicine

## 2012-01-21 ENCOUNTER — Other Ambulatory Visit: Payer: Self-pay | Admitting: Internal Medicine

## 2012-02-06 ENCOUNTER — Encounter: Payer: Self-pay | Admitting: Internal Medicine

## 2012-02-06 ENCOUNTER — Ambulatory Visit (INDEPENDENT_AMBULATORY_CARE_PROVIDER_SITE_OTHER): Payer: BC Managed Care – PPO | Admitting: Internal Medicine

## 2012-02-06 VITALS — BP 102/80 | HR 65 | Temp 97.7°F | Resp 20 | Wt 120.0 lb

## 2012-02-06 DIAGNOSIS — G43909 Migraine, unspecified, not intractable, without status migrainosus: Secondary | ICD-10-CM

## 2012-02-06 DIAGNOSIS — F1120 Opioid dependence, uncomplicated: Secondary | ICD-10-CM

## 2012-02-06 DIAGNOSIS — E109 Type 1 diabetes mellitus without complications: Secondary | ICD-10-CM

## 2012-02-06 DIAGNOSIS — E559 Vitamin D deficiency, unspecified: Secondary | ICD-10-CM

## 2012-02-06 DIAGNOSIS — IMO0001 Reserved for inherently not codable concepts without codable children: Secondary | ICD-10-CM

## 2012-02-06 DIAGNOSIS — I10 Essential (primary) hypertension: Secondary | ICD-10-CM

## 2012-02-06 DIAGNOSIS — F29 Unspecified psychosis not due to a substance or known physiological condition: Secondary | ICD-10-CM

## 2012-02-06 DIAGNOSIS — G43109 Migraine with aura, not intractable, without status migrainosus: Secondary | ICD-10-CM

## 2012-02-06 MED ORDER — OXYCODONE HCL 20 MG PO TB12: 20.0000 mg | ORAL_TABLET | Freq: Two times a day (BID) | ORAL | Status: DC

## 2012-02-06 MED ORDER — MOMETASONE FUROATE 50 MCG/ACT NA SUSP: 2.0000 | Freq: Every day | NASAL | Status: DC

## 2012-02-06 MED ORDER — OXYCODONE HCL 15 MG PO TABS: 15.0000 mg | ORAL_TABLET | Freq: Two times a day (BID) | ORAL | Status: DC | PRN

## 2012-02-06 MED ORDER — RANITIDINE HCL 300 MG PO TABS: 300.0000 mg | ORAL_TABLET | Freq: Every day | ORAL | Status: DC

## 2012-02-06 MED ORDER — ELETRIPTAN HYDROBROMIDE 40 MG PO TABS: 40.0000 mg | ORAL_TABLET | ORAL | Status: DC | PRN

## 2012-02-06 MED ORDER — PROCHLORPERAZINE MALEATE 10 MG PO TABS
10.0000 mg | ORAL_TABLET | Freq: Three times a day (TID) | ORAL | Status: DC | PRN
Start: 2012-02-06 — End: 2012-03-31

## 2012-02-06 NOTE — Assessment & Plan Note (Signed)
She is using less pain meds

## 2012-02-06 NOTE — Assessment & Plan Note (Signed)
Continue with current prescription therapy as reflected on the Med list.  
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
## 2012-02-06 NOTE — Assessment & Plan Note (Signed)
She is reducing her opioids Continue with current triptan prescription therapy as reflected on the Med list.

## 2012-02-06 NOTE — Assessment & Plan Note (Signed)
No relapse 

## 2012-02-13 ENCOUNTER — Other Ambulatory Visit: Payer: Self-pay | Admitting: Internal Medicine

## 2012-02-18 ENCOUNTER — Ambulatory Visit: Payer: BC Managed Care – PPO | Admitting: Endocrinology

## 2012-03-10 ENCOUNTER — Telehealth: Payer: Self-pay | Admitting: Endocrinology

## 2012-03-10 NOTE — Telephone Encounter (Signed)
Pt called req call from the nurse concern about blood sugar being low. Pt does not feel good but would like to speak to the nurse first before schedule an appt with Dr. Everardo All. Please call pt

## 2012-03-10 NOTE — Telephone Encounter (Signed)
The patient called again to discuss low blood sugar.  Please call patient at [phone].

## 2012-03-10 NOTE — Telephone Encounter (Signed)
Called pt back. She had a low CBG in the middle of the night in 12/26: 37. She felt horrible, weak - took 2 Dex4's >> 100. She stopped taking her Levemir 8 units qhs on 12/27. She continued Apidra: 10-5-8, and sugars at night: 87-99. She is waking up every 2 hours to get her husband to the restroom in the week, so feels exhausted. I advised her to: - keep off Levemir for now - will need to re-add basal insulin, but likely at a lower dose in the near future - decrease dinner Apidra to 5 units - call in next few days if still CBG uncontrolled - take 4 Dex 4 tablets when sugars <60-70

## 2012-03-10 NOTE — Telephone Encounter (Signed)
Pt states she has to wake up early in the am around 2 am to help her husband and her blood sugar has been low 12/26 it was 37, only low early in the am, she has been taking apidra 10 units in the am and apidra 5 units at lunch, and not taking levemir at all, pt states she would really like to speak with you in order to get blood sugar undercontrol.

## 2012-03-11 ENCOUNTER — Encounter: Payer: Self-pay | Admitting: Internal Medicine

## 2012-03-11 ENCOUNTER — Ambulatory Visit (INDEPENDENT_AMBULATORY_CARE_PROVIDER_SITE_OTHER): Payer: BC Managed Care – PPO | Admitting: Internal Medicine

## 2012-03-11 VITALS — BP 130/80 | HR 64 | Temp 97.7°F | Ht 62.0 in | Wt 122.2 lb

## 2012-03-11 DIAGNOSIS — G43109 Migraine with aura, not intractable, without status migrainosus: Secondary | ICD-10-CM

## 2012-03-11 DIAGNOSIS — IMO0001 Reserved for inherently not codable concepts without codable children: Secondary | ICD-10-CM

## 2012-03-11 DIAGNOSIS — I10 Essential (primary) hypertension: Secondary | ICD-10-CM

## 2012-03-11 DIAGNOSIS — G43909 Migraine, unspecified, not intractable, without status migrainosus: Secondary | ICD-10-CM

## 2012-03-11 DIAGNOSIS — E109 Type 1 diabetes mellitus without complications: Secondary | ICD-10-CM

## 2012-03-11 DIAGNOSIS — R55 Syncope and collapse: Secondary | ICD-10-CM

## 2012-03-11 DIAGNOSIS — E559 Vitamin D deficiency, unspecified: Secondary | ICD-10-CM

## 2012-03-11 MED ORDER — ZOLMITRIPTAN 5 MG PO TABS: 5.0000 mg | ORAL_TABLET | Freq: Every day | ORAL | Status: DC | PRN

## 2012-03-11 MED ORDER — OXYCODONE HCL 20 MG PO TB12: 20.0000 mg | ORAL_TABLET | Freq: Two times a day (BID) | ORAL | Status: DC

## 2012-03-11 MED ORDER — OXYCODONE HCL 15 MG PO TABS: 15.0000 mg | ORAL_TABLET | Freq: Two times a day (BID) | ORAL | Status: DC | PRN

## 2012-03-11 NOTE — Assessment & Plan Note (Signed)
Continue with current prescription therapy as reflected on the Med list.  

## 2012-03-11 NOTE — Progress Notes (Signed)
   Subjective:    HPI  [name] severe HA's - migraines. She was vomiting this am w/migraine. [name] on severe pain - neck, back. The patient presents for a follow-up of chronic HAs, depression, anxiety, hypertension, chronic dyslipidemia, type 1 diabetes.  Wt Readings from Last 3 Encounters:  03/11/12 122 lb 4 oz (55.452 kg)  02/06/12 120 lb (54.432 kg)  01/09/12 121 lb (54.885 kg)   BP Readings from Last 3 Encounters:  03/11/12 130/80  02/06/12 102/80  01/09/12 118/80      Review of Systems  Constitutional: Positive for fatigue. Negative for chills, activity change, appetite change and unexpected weight change.  HENT: Negative for congestion and mouth sores.   Eyes: Negative for visual disturbance.  Respiratory: Negative for chest tightness.   Genitourinary: Negative for frequency, difficulty urinating and vaginal pain.  Musculoskeletal: Positive for arthralgias and gait problem.  Skin: Negative for pallor and rash.  Neurological: Negative for tremors.  Psychiatric/Behavioral: Negative for suicidal ideas, confusion, sleep disturbance and self-injury. The patient is nervous/anxious.        Objective:   Physical Exam  Constitutional: She appears well-developed and well-nourished. No distress.  HENT:  Head: Normocephalic.  Right Ear: External ear normal.  Left Ear: External ear normal.  Nose: Nose normal.  Mouth/Throat: Oropharynx is clear and moist.  Eyes: Conjunctivae normal are normal. Pupils are equal, round, and reactive to light. Right eye exhibits no discharge. Left eye exhibits no discharge.  Neck: Normal range of motion. Neck supple. No JVD present. No tracheal deviation present. No thyromegaly present.  Cardiovascular: Normal rate, regular rhythm and normal heart sounds.   Pulmonary/Chest: No stridor. No respiratory distress. She has no wheezes.  Abdominal: Soft. Bowel sounds are normal. She exhibits no distension and no mass. There is no tenderness. There is no  rebound and no guarding.  Musculoskeletal: She exhibits no edema and no tenderness.  Lymphadenopathy:    She has no cervical adenopathy.  Neurological: She displays normal reflexes. No cranial nerve deficit. She exhibits normal muscle tone. Coordination normal.  Skin: Skin is warm. No rash noted. No erythema.  Psychiatric: Her behavior is normal. Judgment and thought content normal.       Depressed, not tearful     Lab Results  Component Value Date   WBC 8.7 09/06/2011   HGB 10.6* 09/06/2011   HCT 35.4* 09/06/2011   PLT 212.0 12/04/2010   GLUCOSE 87 05/21/2011   CHOL 226* 06/15/2010   TRIG 213.0* 06/15/2010   HDL 62.60 06/15/2010   LDLDIRECT 131.9 06/15/2010   LDLCALC 99 05/20/2006   ALT 21 05/21/2011   AST 20 05/21/2011   NA 139 05/21/2011   K 3.9 05/21/2011   CL 108 05/21/2011   CREATININE 0.8 05/21/2011   BUN 12 05/21/2011   CO2 23 05/21/2011   TSH 1.32 09/01/2010   INR 0.9 09/01/2010   HGBA1C 7.2* 11/20/2011   MICROALBUR 0.8 08/16/2011        Assessment & Plan:

## 2012-03-11 NOTE — Assessment & Plan Note (Signed)
No relapse 

## 2012-03-26 ENCOUNTER — Telehealth: Payer: Self-pay | Admitting: Endocrinology

## 2012-03-26 NOTE — Telephone Encounter (Signed)
Pt advised and states an understanding 

## 2012-03-26 NOTE — Telephone Encounter (Signed)
Please resume levemir, 4 units at bedtime. Ret as scheduled

## 2012-03-26 NOTE — Telephone Encounter (Signed)
The patient called to state that her blood glucose readings have been running consistently over 200 mcg/dl for the past two weeks.  She was advised to stop Levamir per Dr. Elvera Lennox when her blood sugars were in the 30s and 40s in the middle of the night.  Please call the patient at  [phone] to advise.

## 2012-03-31 ENCOUNTER — Other Ambulatory Visit: Payer: Self-pay | Admitting: Internal Medicine

## 2012-04-09 ENCOUNTER — Ambulatory Visit (INDEPENDENT_AMBULATORY_CARE_PROVIDER_SITE_OTHER): Payer: BC Managed Care – PPO | Admitting: Internal Medicine

## 2012-04-09 ENCOUNTER — Encounter: Payer: Self-pay | Admitting: Internal Medicine

## 2012-04-09 VITALS — BP 130/90 | HR 76 | Temp 98.5°F | Resp 16 | Wt 120.0 lb

## 2012-04-09 DIAGNOSIS — G43819 Other migraine, intractable, without status migrainosus: Secondary | ICD-10-CM

## 2012-04-09 DIAGNOSIS — Z9181 History of falling: Secondary | ICD-10-CM

## 2012-04-09 DIAGNOSIS — F329 Major depressive disorder, single episode, unspecified: Secondary | ICD-10-CM

## 2012-04-09 DIAGNOSIS — F1120 Opioid dependence, uncomplicated: Secondary | ICD-10-CM

## 2012-04-09 DIAGNOSIS — F411 Generalized anxiety disorder: Secondary | ICD-10-CM

## 2012-04-09 DIAGNOSIS — IMO0001 Reserved for inherently not codable concepts without codable children: Secondary | ICD-10-CM

## 2012-04-09 DIAGNOSIS — E109 Type 1 diabetes mellitus without complications: Secondary | ICD-10-CM

## 2012-04-09 DIAGNOSIS — F29 Unspecified psychosis not due to a substance or known physiological condition: Secondary | ICD-10-CM

## 2012-04-09 DIAGNOSIS — K219 Gastro-esophageal reflux disease without esophagitis: Secondary | ICD-10-CM

## 2012-04-09 DIAGNOSIS — R296 Repeated falls: Secondary | ICD-10-CM

## 2012-04-09 MED ORDER — LORAZEPAM 2 MG PO TABS: 2.0000 mg | ORAL_TABLET | Freq: Three times a day (TID) | ORAL | Status: DC | PRN

## 2012-04-09 MED ORDER — OXYCODONE HCL 15 MG PO TABS: 15.0000 mg | ORAL_TABLET | Freq: Two times a day (BID) | ORAL | Status: DC | PRN

## 2012-04-09 MED ORDER — OXYCODONE HCL 20 MG PO TB12: 20.0000 mg | ORAL_TABLET | Freq: Two times a day (BID) | ORAL | Status: DC

## 2012-04-09 NOTE — Assessment & Plan Note (Signed)
Continue with current prescription therapy as reflected on the Med list.  

## 2012-04-09 NOTE — Assessment & Plan Note (Signed)
No relapse 

## 2012-04-09 NOTE — Assessment & Plan Note (Addendum)
Chronic  Potential benefits of a long term benzodiazepines  use as well as potential risks  and complications were explained to the patient and were aknowledged. 1/14 - worse, problems w/Ralph OK to take lorazep tid prn w/caution

## 2012-04-09 NOTE — Progress Notes (Signed)
   Subjective:    HPI  [name] severe HA's - migraines. She was vomiting this am w/migraine. [name] on severe pain - neck, back. The patient presents for a follow-up of chronic HAs, depression, anxiety, hypertension, chronic dyslipidemia, type 1 diabetes. Problems with [name] at home; worse stress. C/o more anxiety - [facility] is helping at home  Wt Readings from Last 3 Encounters:  04/09/12 120 lb (54.432 kg)  03/11/12 122 lb 4 oz (55.452 kg)  02/06/12 120 lb (54.432 kg)   BP Readings from Last 3 Encounters:  04/09/12 130/90  03/11/12 130/80  02/06/12 102/80      Review of Systems  Constitutional: Positive for fatigue. Negative for chills, activity change, appetite change and unexpected weight change.  HENT: Negative for congestion and mouth sores.   Eyes: Negative for visual disturbance.  Respiratory: Negative for chest tightness.   Genitourinary: Negative for frequency, difficulty urinating and vaginal pain.  Musculoskeletal: Positive for arthralgias and gait problem.  Skin: Negative for pallor and rash.  Neurological: Negative for tremors.  Psychiatric/Behavioral: Negative for suicidal ideas, confusion, sleep disturbance and self-injury. The patient is nervous/anxious.        Objective:   Physical Exam  Constitutional: She appears well-developed and well-nourished. No distress.  HENT:  Head: Normocephalic.  Right Ear: External ear normal.  Left Ear: External ear normal.  Nose: Nose normal.  Mouth/Throat: Oropharynx is clear and moist.  Eyes: Conjunctivae normal are normal. Pupils are equal, round, and reactive to light. Right eye exhibits no discharge. Left eye exhibits no discharge.  Neck: Normal range of motion. Neck supple. No JVD present. No tracheal deviation present. No thyromegaly present.  Cardiovascular: Normal rate, regular rhythm and normal heart sounds.   Pulmonary/Chest: No stridor. No respiratory distress. She has no wheezes.  Abdominal: Soft. Bowel sounds  are normal. She exhibits no distension and no mass. There is no tenderness. There is no rebound and no guarding.  Musculoskeletal: She exhibits no edema and no tenderness.  Lymphadenopathy:    She has no cervical adenopathy.  Neurological: She displays normal reflexes. No cranial nerve deficit. She exhibits normal muscle tone. Coordination normal.  Skin: Skin is warm. No rash noted. No erythema.  Psychiatric: Her behavior is normal. Judgment and thought content normal.       Depressed, not tearful     Lab Results  Component Value Date   WBC 8.7 09/06/2011   HGB 10.6* 09/06/2011   HCT 35.4* 09/06/2011   PLT 212.0 12/04/2010   GLUCOSE 87 05/21/2011   CHOL 226* 06/15/2010   TRIG 213.0* 06/15/2010   HDL 62.60 06/15/2010   LDLDIRECT 131.9 06/15/2010   LDLCALC 99 05/20/2006   ALT 21 05/21/2011   AST 20 05/21/2011   NA 139 05/21/2011   K 3.9 05/21/2011   CL 108 05/21/2011   CREATININE 0.8 05/21/2011   BUN 12 05/21/2011   CO2 23 05/21/2011   TSH 1.32 09/01/2010   INR 0.9 09/01/2010   HGBA1C 7.2* 11/20/2011   MICROALBUR 0.8 08/16/2011        Assessment & Plan:

## 2012-04-11 ENCOUNTER — Encounter: Payer: Self-pay | Admitting: Endocrinology

## 2012-04-11 ENCOUNTER — Ambulatory Visit (INDEPENDENT_AMBULATORY_CARE_PROVIDER_SITE_OTHER): Payer: BC Managed Care – PPO | Admitting: Endocrinology

## 2012-04-11 VITALS — BP 122/70 | HR 78 | Wt 120.0 lb

## 2012-04-11 DIAGNOSIS — E109 Type 1 diabetes mellitus without complications: Secondary | ICD-10-CM

## 2012-04-11 NOTE — Patient Instructions (Addendum)
continue apidra, 10 units with breakfast 5 units with lunch and 7 units with evening meal.   continue levemir, 8 units at bedtime.   check your blood sugar 4 times a day--before the 3 meals, and at bedtime.  also check if you have symptoms of your blood sugar being too high or too low.  please keep a record of the readings and bring it to your next appointment here.  please call us sooner if your blood sugar goes below 70, or if you have a lot of readings over 200.   Please come back for a follow-up appointment in 3 months.

## 2012-04-11 NOTE — Progress Notes (Signed)
Subjective:    Patient ID: Lindsey Gomez, female    DOB: [birth year], 67 y.o.   MRN: [id number]  HPI Pt has h/o insulin-requiring DM (dx'ed 1990, after a 40% pancreatectomy at [facility], for pancreatitis; no known complications; she had an episode of severe hypoglycemia in September, 2013). she brings a record of his cbg's which i have reviewed today.  It varies from 71-20's. There is no trend throughout the day, except it is lowest at hs.   Past Medical History  Diagnosis Date  . Depression   . Type II or unspecified type diabetes mellitus without mention of complication, not stated as uncontrolled   . HTN (hypertension)   . Chronic pain   . Fibromyalgia   . Migraines   . History of breast cancer     Right  . Chronic pancreatitis   . Vitamin D deficiency   . GERD (gastroesophageal reflux disease)   . Finger dislocation 2009    L 3rd  . COPD (chronic obstructive pulmonary disease)   . Anemia   . Fractured sternum 11/2008  . Osteoporosis     Reclast too expensive  . Barrett's esophagus   . Chronic fatigue   . Diverticulosis     Past Surgical History  Procedure Date  . Nasal sinus surgery   . Pancreatectomy     40%  . Cholecystectomy   . Appendectomy   . Abdominal hysterectomy     History   Social History  . Marital Status: Married    Spouse Name: [name]    Number of Children: N/A  . Years of Education: N/A   Occupational History  . 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 Smoking status: Current Every Day Smoker -- 2.0 packs/day  . Smokeless tobacco: Never Used  . Alcohol Use: No  . Drug Use: No  . Sexually Active: No   Other Topics Concern  . Not on file   Social History Narrative   Did not go back to work Oct 23, [name] is very ill - unable to have financial ends meet, no incomeRegular Exercise -  NOCoffee daily     Current Outpatient Prescriptions on File Prior to Visit  Medication Sig Dispense Refill  . ACCU-CHEK SOFTCLIX LANCETS  lancets Use as instructed  100 each  3  . amitriptyline (ELAVIL) 50 MG tablet Take 0.5-1 tablets (25-50 mg total) by mouth at bedtime.  60 tablet  5  . amLODipine-benazepril (LOTREL) 5-10 MG per capsule Take 1 capsule by mouth daily.  30 capsule  11  . atenolol (TENORMIN) 100 MG tablet Take 1 tablet (100 mg total) by mouth every morning.  30 tablet  11  . atorvastatin (LIPITOR) 10 MG tablet Take 1 tablet (10 mg total) by mouth daily.  30 tablet  11  . diclofenac sodium (VOLTAREN) 1 % GEL Apply topically 4 (four) times daily as needed.        . eletriptan (RELPAX) 40 MG tablet One tablet by mouth at onset of headache. May repeat in 2 hours if headache persists or recurs. may repeat in 2 hours if necessary  8 tablet  11  . escitalopram (LEXAPRO) 20 MG tablet Take 1 tablet (20 mg total) by mouth daily.  30 tablet  5  . glucagon 1 MG injection Inject 1 mg into the vein once as needed.  1 each  12  . glucose blood (ACCU-CHEK AVIVA PLUS) test strip 1  each by Other route 4 (four) times daily. And lancets 4/day 250.03  120 each  11  . hydrocortisone (CORTEF) 10 MG tablet Take 1 tablet (10 mg total) by mouth 2 (two) times daily. Morning and at lunch  60 tablet  11  . insulin detemir (LEVEMIR) 100 UNIT/ML injection Inject 8 Units into the skin at bedtime.       . insulin glulisine (APIDRA) 100 UNIT/ML injection Inject subcutaneously 10 units with breakfast 5 units with lunch and 7 units with evening meal  9 mL  0  . Insulin Pen Needle (B-D ULTRAFINE III SHORT PEN) 31G X 8 MM MISC Use as directed 5 times daily  200 each  11  . Ketorolac Tromethamine (SPRIX) 15.75 MG/SPRAY SOLN 1 spray by Nasal route 3 (three) times daily as needed.        [name] LORazepam (ATIVAN) 2 MG tablet Take 1 tablet (2 mg total) by mouth every 8 (eight) hours as needed for anxiety.  90 tablet  3  . mometasone (NASONEX) 50 MCG/ACT nasal spray Place 2 sprays into the nose daily.  17 g  11  . omeprazole (PRILOSEC) 40 MG capsule TAKE 1 CAPSULE  TWICE DAILY FOR STOMACH ACID.  60 capsule  5  . oxyCODONE (OXYCONTIN) 20 MG 12 hr tablet Take 1 tablet (20 mg total) by mouth every 12 (twelve) hours. Please fill on or after 04/29/12  60 tablet  0  . oxyCODONE (ROXICODONE) 15 MG immediate release tablet Take 1 tablet (15 mg total) by mouth 2 (two) times daily as needed for pain. Ok to fill on 04/10/12. Thx!  60 tablet  0  . prochlorperazine (COMPAZINE) 10 MG tablet TAKE 1 TABLET EVERY 8 HOURS AS NEEDED FOR NAUSEA.  60 tablet  1  . ranitidine (ZANTAC) 300 MG tablet Take 1 tablet (300 mg total) by mouth at bedtime.  30 tablet  11  . ROCALTROL 0.5 MCG capsule TAKE (1) CAPSULE TWICE DAILY.  60 each  5  . tamoxifen (NOLVADEX) 20 MG tablet Take 20 mg by mouth 2 (two) times daily.        [name] topiramate (TOPAMAX) 200 MG tablet Take 1 tablet (200 mg total) by mouth 2 (two) times daily.  60 tablet  5  . triamcinolone (KENALOG) 0.1 % ointment Apply topically 2 (two) times daily as needed.        . zolmitriptan (ZOMIG) 5 MG tablet Take 1 tablet (5 mg total) by mouth daily as needed for migraine.  8 tablet  5   Current Facility-Administered Medications on File Prior to Visit  Medication Dose Route Frequency Provider Last Rate Last Dose  . 0.9 %  sodium chloride infusion  500 mL Intravenous Continuous Louis Meckel, MD      . methylPREDNISolone acetate (DEPO-MEDROL) injection 40 mg  40 mg Intra-articular Once [name], MD        Allergies  Allergen Reactions  . Milnacipran     REACTION: HA and hand swelling  . Moxifloxacin   . Rizatriptan Benzoate     REACTION: n/v  . Sulfonamide Derivatives   . Venlafaxine     Family History  Problem Relation Age of Onset  . Colon cancer Neg Hx   . COPD Father   . Heart disease Father   . Leukemia Mother   . Diabetes Maternal Uncle     BP 122/70  Pulse 78  Wt 120 lb (54.432 kg)  SpO2 97%  Review of Systems  Denies LOC    Objective:   Physical Exam VITAL SIGNS:  See vs page GENERAL: no  distress PSYCH: Alert and oriented x 3.  Does not appear anxious nor depressed.  Lab Results  Component Value Date   HGBA1C 7.7* 04/11/2012      Assessment & Plan:  DM: rx limited by mild hypoglycemia

## 2012-04-26 ENCOUNTER — Other Ambulatory Visit: Payer: Self-pay | Admitting: Endocrinology

## 2012-04-29 ENCOUNTER — Other Ambulatory Visit: Payer: Self-pay | Admitting: *Deleted

## 2012-04-29 MED ORDER — INSULIN PEN NEEDLE 31G X 8 MM MISC: Status: DC

## 2012-05-07 ENCOUNTER — Ambulatory Visit (INDEPENDENT_AMBULATORY_CARE_PROVIDER_SITE_OTHER): Payer: Medicare Other | Admitting: Internal Medicine

## 2012-05-07 ENCOUNTER — Encounter: Payer: Self-pay | Admitting: Internal Medicine

## 2012-05-07 VITALS — BP 130/80 | HR 80 | Temp 97.5°F | Resp 16 | Wt 115.0 lb

## 2012-05-07 DIAGNOSIS — E109 Type 1 diabetes mellitus without complications: Secondary | ICD-10-CM

## 2012-05-07 DIAGNOSIS — F17200 Nicotine dependence, unspecified, uncomplicated: Secondary | ICD-10-CM

## 2012-05-07 DIAGNOSIS — G43819 Other migraine, intractable, without status migrainosus: Secondary | ICD-10-CM

## 2012-05-07 DIAGNOSIS — R110 Nausea: Secondary | ICD-10-CM

## 2012-05-07 DIAGNOSIS — IMO0001 Reserved for inherently not codable concepts without codable children: Secondary | ICD-10-CM

## 2012-05-07 DIAGNOSIS — G43909 Migraine, unspecified, not intractable, without status migrainosus: Secondary | ICD-10-CM

## 2012-05-07 DIAGNOSIS — F1120 Opioid dependence, uncomplicated: Secondary | ICD-10-CM

## 2012-05-07 DIAGNOSIS — G43109 Migraine with aura, not intractable, without status migrainosus: Secondary | ICD-10-CM

## 2012-05-07 DIAGNOSIS — I10 Essential (primary) hypertension: Secondary | ICD-10-CM

## 2012-05-07 DIAGNOSIS — R51 Headache: Secondary | ICD-10-CM

## 2012-05-07 MED ORDER — OXYCODONE HCL 20 MG PO TB12: 20.0000 mg | ORAL_TABLET | Freq: Two times a day (BID) | ORAL | Status: DC

## 2012-05-07 MED ORDER — OXYCODONE HCL 15 MG PO TABS: 15.0000 mg | ORAL_TABLET | Freq: Three times a day (TID) | ORAL | Status: DC | PRN

## 2012-05-07 MED ORDER — GABAPENTIN 300 MG PO CAPS: 300.0000 mg | ORAL_CAPSULE | Freq: Three times a day (TID) | ORAL | Status: DC

## 2012-05-07 MED ORDER — ZOLMITRIPTAN 5 MG PO TABS: 5.0000 mg | ORAL_TABLET | Freq: Every day | ORAL | Status: DC | PRN

## 2012-05-07 MED ORDER — CALCITRIOL 0.5 MCG PO CAPS: 1.0000 ug | ORAL_CAPSULE | Freq: Every day | ORAL | Status: DC

## 2012-05-07 MED ORDER — PROCHLORPERAZINE MALEATE 10 MG PO TABS: 10.0000 mg | ORAL_TABLET | Freq: Three times a day (TID) | ORAL | Status: DC | PRN

## 2012-05-07 NOTE — Progress Notes (Signed)
Subjective:    Migraine  This is a chronic problem. The current episode started more than 1 year ago. The problem occurs daily. The problem has been gradually worsening. The pain is located in the bilateral region. The pain radiates to the right neck and left neck. The pain quality is similar to prior headaches. The quality of the pain is described as shooting. The pain is at a severity of 10/10. The pain is severe. Associated symptoms include muscle aches, nausea and vomiting. The symptoms are aggravated by fatigue. Her past medical history is significant for cancer and migraine headaches.    [name] severe HA's - migraines. She was vomiting this am w/migraine. [name] on severe pain - neck, back. The patient presents for a follow-up of chronic HAs, depression, anxiety, hypertension, chronic dyslipidemia, type 1 diabetes. Problems with [name] at home; worse stress. C/o more anxiety - [facility] is helping at home  Wt Readings from Last 3 Encounters:  05/07/12 115 lb (52.164 kg)  04/11/12 120 lb (54.432 kg)  04/09/12 120 lb (54.432 kg)   BP Readings from Last 3 Encounters:  05/07/12 130/80  04/11/12 122/70  04/09/12 130/90      Review of Systems  Constitutional: Positive for fatigue. Negative for chills, activity change, appetite change and unexpected weight change.  HENT: Negative for congestion and mouth sores.   Eyes: Negative for visual disturbance.  Respiratory: Negative for chest tightness.   Gastrointestinal: Positive for nausea and vomiting.  Genitourinary: Negative for frequency, difficulty urinating and vaginal pain.  Musculoskeletal: Positive for arthralgias and gait problem.  Skin: Negative for pallor and rash.  Neurological: Negative for tremors.  Psychiatric/Behavioral: Negative for suicidal ideas, confusion, sleep disturbance and self-injury. The patient is nervous/anxious.        Objective:   Physical Exam  Constitutional: She appears well-developed and  well-nourished. No distress.  HENT:  Head: Normocephalic.  Right Ear: External ear normal.  Left Ear: External ear normal.  Nose: Nose normal.  Mouth/Throat: Oropharynx is clear and moist.  Eyes: Conjunctivae are normal. Pupils are equal, round, and reactive to light. Right eye exhibits no discharge. Left eye exhibits no discharge.  Neck: Normal range of motion. Neck supple. No JVD present. No tracheal deviation present. No thyromegaly present.  Cardiovascular: Normal rate, regular rhythm and normal heart sounds.   Pulmonary/Chest: No stridor. No respiratory distress. She has no wheezes.  Abdominal: Soft. Bowel sounds are normal. She exhibits no distension and no mass. There is no tenderness. There is no rebound and no guarding.  Musculoskeletal: She exhibits no edema and no tenderness.  Lymphadenopathy:    She has no cervical adenopathy.  Neurological: She displays normal reflexes. No cranial nerve deficit. She exhibits normal muscle tone. Coordination normal.  Skin: Skin is warm. No rash noted. No erythema.  Psychiatric: Her behavior is normal. Judgment and thought content normal.  Depressed, not tearful     Lab Results  Component Value Date   WBC 8.7 09/06/2011   HGB 10.6* 09/06/2011   HCT 35.4* 09/06/2011   PLT 212.0 12/04/2010   GLUCOSE 87 05/21/2011   CHOL 226* 06/15/2010   TRIG 213.0* 06/15/2010   HDL 62.60 06/15/2010   LDLDIRECT 131.9 06/15/2010   LDLCALC 99 05/20/2006   ALT 21 05/21/2011   AST 20 05/21/2011   NA 139 05/21/2011   K 3.9 05/21/2011   CL 108 05/21/2011   CREATININE 0.8 05/21/2011   BUN 12 05/21/2011   CO2 23 05/21/2011   TSH 1.32  09/01/2010   INR 0.9 09/01/2010   HGBA1C 7.7* 04/11/2012   MICROALBUR 0.8 08/16/2011    a complex case    Assessment & Plan:

## 2012-05-07 NOTE — Assessment & Plan Note (Signed)
Discussed.

## 2012-05-07 NOTE — Assessment & Plan Note (Signed)
Continue with current prescription therapy as reflected on the Med list.  

## 2012-05-07 NOTE — Assessment & Plan Note (Signed)
Added Gbapentin

## 2012-05-07 NOTE — Assessment & Plan Note (Signed)
Worse 

## 2012-06-04 ENCOUNTER — Ambulatory Visit (INDEPENDENT_AMBULATORY_CARE_PROVIDER_SITE_OTHER): Payer: Self-pay | Admitting: Internal Medicine

## 2012-06-04 ENCOUNTER — Encounter: Payer: Self-pay | Admitting: Internal Medicine

## 2012-06-04 VITALS — BP 130/70 | HR 72 | Temp 98.4°F | Resp 16 | Wt 113.0 lb

## 2012-06-04 DIAGNOSIS — IMO0001 Reserved for inherently not codable concepts without codable children: Secondary | ICD-10-CM

## 2012-06-04 DIAGNOSIS — F3289 Other specified depressive episodes: Secondary | ICD-10-CM

## 2012-06-04 DIAGNOSIS — G43109 Migraine with aura, not intractable, without status migrainosus: Secondary | ICD-10-CM

## 2012-06-04 DIAGNOSIS — G43909 Migraine, unspecified, not intractable, without status migrainosus: Secondary | ICD-10-CM

## 2012-06-04 DIAGNOSIS — Z9181 History of falling: Secondary | ICD-10-CM

## 2012-06-04 DIAGNOSIS — K219 Gastro-esophageal reflux disease without esophagitis: Secondary | ICD-10-CM

## 2012-06-04 DIAGNOSIS — J4489 Other specified chronic obstructive pulmonary disease: Secondary | ICD-10-CM

## 2012-06-04 DIAGNOSIS — F329 Major depressive disorder, single episode, unspecified: Secondary | ICD-10-CM

## 2012-06-04 DIAGNOSIS — E559 Vitamin D deficiency, unspecified: Secondary | ICD-10-CM

## 2012-06-04 DIAGNOSIS — J449 Chronic obstructive pulmonary disease, unspecified: Secondary | ICD-10-CM

## 2012-06-04 DIAGNOSIS — M810 Age-related osteoporosis without current pathological fracture: Secondary | ICD-10-CM

## 2012-06-04 DIAGNOSIS — R296 Repeated falls: Secondary | ICD-10-CM

## 2012-06-04 DIAGNOSIS — F1120 Opioid dependence, uncomplicated: Secondary | ICD-10-CM

## 2012-06-04 DIAGNOSIS — E109 Type 1 diabetes mellitus without complications: Secondary | ICD-10-CM

## 2012-06-04 DIAGNOSIS — I10 Essential (primary) hypertension: Secondary | ICD-10-CM

## 2012-06-04 MED ORDER — OXYCODONE HCL 20 MG PO TB12: 20.0000 mg | ORAL_TABLET | Freq: Two times a day (BID) | ORAL | Status: DC

## 2012-06-04 MED ORDER — ATORVASTATIN CALCIUM 10 MG PO TABS: 10.0000 mg | ORAL_TABLET | Freq: Every day | ORAL | Status: DC

## 2012-06-04 MED ORDER — ESCITALOPRAM OXALATE 20 MG PO TABS: 20.0000 mg | ORAL_TABLET | Freq: Every day | ORAL | Status: DC

## 2012-06-04 MED ORDER — ATENOLOL 100 MG PO TABS: 100.0000 mg | ORAL_TABLET | ORAL | Status: DC

## 2012-06-04 MED ORDER — TOPIRAMATE 200 MG PO TABS: 200.0000 mg | ORAL_TABLET | Freq: Two times a day (BID) | ORAL | Status: DC

## 2012-06-04 MED ORDER — OXYCODONE HCL 15 MG PO TABS: 15.0000 mg | ORAL_TABLET | Freq: Three times a day (TID) | ORAL | Status: DC | PRN

## 2012-06-04 NOTE — Assessment & Plan Note (Signed)
Continue with current prescription therapy as reflected on the Med list.  

## 2012-06-04 NOTE — Progress Notes (Signed)
Subjective:    Migraine  This is a chronic problem. The current episode started more than 1 year ago. The problem occurs daily. The problem has been gradually worsening. The pain is located in the bilateral region. The pain radiates to the right neck and left neck. The pain quality is similar to prior headaches. The quality of the pain is described as shooting. The pain is at a severity of 10/10. The pain is severe. Associated symptoms include muscle aches, nausea and vomiting. The symptoms are aggravated by fatigue. Her past medical history is significant for cancer and migraine headaches.  Not better. Gabapentin caused side effects - Talana stopped it...  [name] severe HA's - migraines. She was vomiting this am w/migraine. [name] on severe pain - neck, back. The patient presents for a follow-up of chronic HAs, depression, anxiety, hypertension, chronic dyslipidemia, type 1 diabetes. Problems with [name] at home; worse stress. C/o more anxiety - [facility] is helping at home  Wt Readings from Last 3 Encounters:  06/04/12 113 lb (51.256 kg)  05/07/12 115 lb (52.164 kg)  04/11/12 120 lb (54.432 kg)   BP Readings from Last 3 Encounters:  06/04/12 130/70  05/07/12 130/80  04/11/12 122/70      Review of Systems  Constitutional: Positive for fatigue. Negative for chills, activity change, appetite change and unexpected weight change.  HENT: Negative for congestion and mouth sores.   Eyes: Negative for visual disturbance.  Respiratory: Negative for chest tightness.   Gastrointestinal: Positive for nausea and vomiting.  Genitourinary: Negative for frequency, difficulty urinating and vaginal pain.  Musculoskeletal: Positive for arthralgias and gait problem.  Skin: Negative for pallor and rash.  Neurological: Negative for tremors.  Psychiatric/Behavioral: Negative for suicidal ideas, confusion, sleep disturbance and self-injury. The patient is nervous/anxious.        Objective:   Physical Exam   Constitutional: She appears well-developed and well-nourished. No distress.  HENT:  Head: Normocephalic.  Right Ear: External ear normal.  Left Ear: External ear normal.  Nose: Nose normal.  Mouth/Throat: Oropharynx is clear and moist.  Eyes: Conjunctivae are normal. Pupils are equal, round, and reactive to light. Right eye exhibits no discharge. Left eye exhibits no discharge.  Neck: Normal range of motion. Neck supple. No JVD present. No tracheal deviation present. No thyromegaly present.  Cardiovascular: Normal rate, regular rhythm and normal heart sounds.   Pulmonary/Chest: No stridor. No respiratory distress. She has no wheezes.  Abdominal: Soft. Bowel sounds are normal. She exhibits no distension and no mass. There is no tenderness. There is no rebound and no guarding.  Musculoskeletal: She exhibits no edema and no tenderness.  Lymphadenopathy:    She has no cervical adenopathy.  Neurological: She displays normal reflexes. No cranial nerve deficit. She exhibits normal muscle tone. Coordination normal.  Skin: Skin is warm. No rash noted. No erythema.  Psychiatric: Her behavior is normal. Judgment and thought content normal.  Depressed, not tearful     Lab Results  Component Value Date   WBC 8.7 09/06/2011   HGB 10.6* 09/06/2011   HCT 35.4* 09/06/2011   PLT 212.0 12/04/2010   GLUCOSE 87 05/21/2011   CHOL 226* 06/15/2010   TRIG 213.0* 06/15/2010   HDL 62.60 06/15/2010   LDLDIRECT 131.9 06/15/2010   LDLCALC 99 05/20/2006   ALT 21 05/21/2011   AST 20 05/21/2011   NA 139 05/21/2011   K 3.9 05/21/2011   CL 108 05/21/2011   CREATININE 0.8 05/21/2011   BUN 12 05/21/2011  CO2 23 05/21/2011   TSH 1.32 09/01/2010   INR 0.9 09/01/2010   HGBA1C 7.7* 04/11/2012   MICROALBUR 0.8 08/16/2011    a complex case    Assessment & Plan:

## 2012-06-04 NOTE — Assessment & Plan Note (Signed)
Needs to d/c smoking 

## 2012-06-04 NOTE — Assessment & Plan Note (Signed)
Vit d 

## 2012-06-04 NOTE — Assessment & Plan Note (Signed)
Better  

## 2012-06-04 NOTE — Assessment & Plan Note (Signed)
Discussed.

## 2012-06-04 NOTE — Assessment & Plan Note (Signed)
Continue with current prescription therapy as reflected on the Med list. Appt w/Dr Everardo All on 4/1 pending

## 2012-06-10 ENCOUNTER — Ambulatory Visit (INDEPENDENT_AMBULATORY_CARE_PROVIDER_SITE_OTHER): Payer: MEDICARE | Admitting: Endocrinology

## 2012-06-10 ENCOUNTER — Encounter: Payer: Self-pay | Admitting: Endocrinology

## 2012-06-10 VITALS — BP 126/70 | HR 75 | Wt 112.0 lb

## 2012-06-10 DIAGNOSIS — E109 Type 1 diabetes mellitus without complications: Secondary | ICD-10-CM

## 2012-06-10 NOTE — Patient Instructions (Addendum)
Please reduce apidra to 10 units with breakfast 5 units with lunch and 6 units with evening meal, and: continue levemir, 8 units at bedtime.   If you vomit a meal, try to at least take some glucose tablets, to prevent low-blood sugar. check your blood sugar 4 times a day--before the 3 meals, and at bedtime.  also check if you have symptoms of your blood sugar being too high or too low.  please keep a record of the readings and bring it to your next appointment here.  please call us sooner if your blood sugar goes below 70, or if you have a lot of readings over 200.   Please come back for a follow-up appointment in 2 months.

## 2012-06-10 NOTE — Progress Notes (Signed)
Subjective:    Patient ID: Lindsey Gomez, female    DOB: November 30, 1945, 67 y.o.   MRN: [id number]  HPI Pt has h/o insulin-requiring DM (dx'ed 1990, after a 40% pancreatectomy at [facility], [place], for pancreatitis; no known complications; she had an episode of severe hypoglycemia in September, 2013; she has never had DKA). she brings a record of his cbg's which i have reviewed today.  It varies from 42-300.  It is lowest at hs.  The episode of cbg of 42 was when she had n/v, due to migraine, which she continues to have intermittently.   Past Medical History  Diagnosis Date  . Depression   . Type II or unspecified type diabetes mellitus without mention of complication, not stated as uncontrolled   . HTN (hypertension)   . Chronic pain   . Fibromyalgia   . Migraines   . History of breast cancer     Right  . Chronic pancreatitis   . Vitamin D deficiency   . GERD (gastroesophageal reflux disease)   . Finger dislocation 2009    L 3rd  . COPD (chronic obstructive pulmonary disease)   . Anemia   . Fractured sternum 11/2008  . Osteoporosis     Reclast too expensive  . Barrett's esophagus   . Chronic fatigue   . Diverticulosis     Past Surgical History  Procedure Laterality Date  . Nasal sinus surgery    . Pancreatectomy      40%  . Cholecystectomy    . Appendectomy    . Abdominal hysterectomy      History   Social History  . Marital Status: Married    Spouse Name: [name]    Number of Children: N/A  . Years of Education: N/A   Occupational History  . 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 Smoking status: Current Every Day Smoker -- 2.00 packs/day  . Smokeless tobacco: Never Used  . Alcohol Use: No  . Drug Use: No  . Sexually Active: No   Other Topics Concern  . Not on file   Social History Narrative   Did not go back to work Oct 23, [name] is very ill - unable to have financial ends meet, no income      Regular Exercise -  NO      Coffee daily      Current Outpatient Prescriptions on File Prior to Visit  Medication Sig Dispense Refill  . ACCU-CHEK AVIVA PLUS test strip CHECK BLOOD SUGAR 4 TIMES A DAY.  100 each  6  . ACCU-CHEK SOFTCLIX LANCETS lancets Use as instructed  100 each  3  . amitriptyline (ELAVIL) 50 MG tablet Take 0.5-1 tablets (25-50 mg total) by mouth at bedtime.  60 tablet  5  . amLODipine-benazepril (LOTREL) 5-10 MG per capsule Take 1 capsule by mouth daily.  30 capsule  11  . atenolol (TENORMIN) 100 MG tablet Take 1 tablet (100 mg total) by mouth every morning.  30 tablet  11  . atorvastatin (LIPITOR) 10 MG tablet Take 1 tablet (10 mg total) by mouth daily.  30 tablet  11  . calcitRIOL (ROCALTROL) 0.5 MCG capsule Take 2 capsules (1 mcg total) by mouth daily.  60 capsule  11  . diclofenac sodium (VOLTAREN) 1 % GEL Apply topically 4 (four) times daily as needed.        . eletriptan (RELPAX) 40 MG tablet One tablet by mouth  at onset of headache. May repeat in 2 hours if headache persists or recurs. may repeat in 2 hours if necessary  8 tablet  11  . escitalopram (LEXAPRO) 20 MG tablet Take 1 tablet (20 mg total) by mouth daily.  30 tablet  5  . glucagon 1 MG injection Inject 1 mg into the vein once as needed.  1 each  12  . hydrocortisone (CORTEF) 10 MG tablet Take 1 tablet (10 mg total) by mouth 2 (two) times daily. Morning and at lunch  60 tablet  11  . insulin detemir (LEVEMIR) 100 UNIT/ML injection Inject 8 Units into the skin at bedtime.       . insulin glulisine (APIDRA) 100 UNIT/ML injection Inject subcutaneously 10 units with breakfast 5 units with lunch and 6 units with evening meal  9 mL  0  . Insulin Pen Needle (B-D ULTRAFINE III SHORT PEN) 31G X 8 MM MISC Use as directed 5 times daily  200 each  11  . Ketorolac Tromethamine (SPRIX) 15.75 MG/SPRAY SOLN 1 spray by Nasal route 3 (three) times daily as needed.        [name] LORazepam (ATIVAN) 2 MG tablet Take 1 tablet (2 mg total) by mouth every 8 (eight) hours as needed  for anxiety.  90 tablet  3  . mometasone (NASONEX) 50 MCG/ACT nasal spray Place 2 sprays into the nose daily.  17 g  11  . omeprazole (PRILOSEC) 40 MG capsule TAKE 1 CAPSULE TWICE DAILY FOR STOMACH ACID.  60 capsule  5  . oxyCODONE (OXYCONTIN) 20 MG 12 hr tablet Take 1 tablet (20 mg total) by mouth every 12 (twelve) hours. Please fill on or after 06/26/12  60 tablet  0  . oxyCODONE (ROXICODONE) 15 MG immediate release tablet Take 1 tablet (15 mg total) by mouth every 8 (eight) hours as needed for pain.  90 tablet  0  . prochlorperazine (COMPAZINE) 10 MG tablet Take 1 tablet (10 mg total) by mouth every 8 (eight) hours as needed.  60 tablet  1  . ranitidine (ZANTAC) 300 MG tablet Take 1 tablet (300 mg total) by mouth at bedtime.  30 tablet  11  . tamoxifen (NOLVADEX) 20 MG tablet Take 20 mg by mouth 2 (two) times daily.        [name] topiramate (TOPAMAX) 200 MG tablet Take 1 tablet (200 mg total) by mouth 2 (two) times daily.  60 tablet  5  . triamcinolone (KENALOG) 0.1 % ointment Apply topically 2 (two) times daily as needed.        . zolmitriptan (ZOMIG) 5 MG tablet Take 1 tablet (5 mg total) by mouth daily as needed for migraine.  8 tablet  5   Current Facility-Administered Medications on File Prior to Visit  Medication Dose Route Frequency Provider Last Rate Last Dose  . 0.9 %  sodium chloride infusion  500 mL Intravenous Continuous Louis Meckel, MD      . methylPREDNISolone acetate (DEPO-MEDROL) injection 40 mg  40 mg Intra-articular Once [name], MD        Allergies  Allergen Reactions  . Gabapentin     Weak muscles  . Milnacipran     REACTION: HA and hand swelling  . Moxifloxacin   . Rizatriptan Benzoate     REACTION: n/v  . Sulfonamide Derivatives   . Venlafaxine     Family History  Problem Relation Age of Onset  . Colon cancer Neg Hx   .  COPD Father   . Heart disease Father   . Leukemia Mother   . Diabetes Maternal Uncle     BP 126/70  Pulse 75  Wt 112 lb  (50.803 kg)  BMI 20.48 kg/m2  SpO2 96%  Review of Systems Denies LOC    Objective:   Physical Exam VITAL SIGNS:  See vs page GENERAL: no distress Pulses: dorsalis pedis intact bilat.   Feet: no deformity.  no ulcer on the feet.  feet are of normal color and temp.  no edema.  There is bilateral onychomycosis and ingrown toenails. Neuro: sensation is intact to touch on the feet     Assessment & Plan:  DM: therapy limited by uncertainty of meals (n/v from migraine)

## 2012-06-17 ENCOUNTER — Encounter: Payer: Self-pay | Admitting: Gastroenterology

## 2012-07-04 ENCOUNTER — Encounter: Payer: Self-pay | Admitting: Internal Medicine

## 2012-07-04 ENCOUNTER — Other Ambulatory Visit (INDEPENDENT_AMBULATORY_CARE_PROVIDER_SITE_OTHER): Payer: MEDICARE

## 2012-07-04 ENCOUNTER — Ambulatory Visit (INDEPENDENT_AMBULATORY_CARE_PROVIDER_SITE_OTHER): Payer: MEDICARE | Admitting: Internal Medicine

## 2012-07-04 ENCOUNTER — Telehealth: Payer: Self-pay | Admitting: Internal Medicine

## 2012-07-04 VITALS — BP 110/78 | HR 80 | Temp 98.3°F | Resp 16 | Wt 112.0 lb

## 2012-07-04 DIAGNOSIS — I10 Essential (primary) hypertension: Secondary | ICD-10-CM

## 2012-07-04 DIAGNOSIS — IMO0001 Reserved for inherently not codable concepts without codable children: Secondary | ICD-10-CM

## 2012-07-04 DIAGNOSIS — E109 Type 1 diabetes mellitus without complications: Secondary | ICD-10-CM

## 2012-07-04 DIAGNOSIS — F1120 Opioid dependence, uncomplicated: Secondary | ICD-10-CM

## 2012-07-04 DIAGNOSIS — F411 Generalized anxiety disorder: Secondary | ICD-10-CM

## 2012-07-04 LAB — CBC WITH DIFFERENTIAL/PLATELET
Basophils Absolute: 0 10*3/uL (ref 0.0–0.1)
Eosinophils Absolute: 0.3 10*3/uL (ref 0.0–0.7)
Hemoglobin: 11 g/dL — ABNORMAL LOW (ref 12.0–15.0)
Lymphocytes Relative: 27.9 % (ref 12.0–46.0)
MCHC: 33.3 g/dL (ref 30.0–36.0)
Monocytes Relative: 7.7 % (ref 3.0–12.0)
Neutrophils Relative %: 61 % (ref 43.0–77.0)
Platelets: 173 10*3/uL (ref 150.0–400.0)
RDW: 15.1 % — ABNORMAL HIGH (ref 11.5–14.6)

## 2012-07-04 LAB — BASIC METABOLIC PANEL
CO2: 27 mEq/L (ref 19–32)
Chloride: 105 mEq/L (ref 96–112)
Sodium: 140 mEq/L (ref 135–145)

## 2012-07-04 MED ORDER — OXYCODONE HCL 15 MG PO TABS: 15.0000 mg | ORAL_TABLET | Freq: Three times a day (TID) | ORAL | Status: DC | PRN

## 2012-07-04 MED ORDER — OXYCODONE HCL 20 MG PO TB12: 20.0000 mg | ORAL_TABLET | Freq: Two times a day (BID) | ORAL | Status: DC

## 2012-07-04 NOTE — Assessment & Plan Note (Signed)
Continue with current prescription therapy as reflected on the Med list.  

## 2012-07-04 NOTE — Assessment & Plan Note (Signed)
No driving on pain meds discussed Continue with current prescription therapy as reflected on the Med list. Use less

## 2012-07-04 NOTE — Assessment & Plan Note (Signed)
Foot exam per Dr Everardo All Continue with current prescription therapy as reflected on the Med list.

## 2012-07-04 NOTE — Telephone Encounter (Signed)
Misty Stanley, please, inform patient that all labs are ok except for the need to drink more fluids. Ralph's labs were ok Thx

## 2012-07-04 NOTE — Progress Notes (Signed)
Subjective:    Migraine  This is a chronic problem. The current episode started more than 1 year ago. The problem occurs daily. The problem has been gradually worsening. The pain is located in the bilateral region. The pain radiates to the right neck and left neck. The pain quality is similar to prior headaches. The quality of the pain is described as shooting. The pain is at a severity of 10/10. The pain is severe. Associated symptoms include muscle aches, nausea and vomiting. The symptoms are aggravated by fatigue. Her past medical history is significant for cancer and migraine headaches.   Not better. Gabapentin caused side effects - Desmond stopped it a while ago...  [name] severe HA's - migraines. She was vomiting this am w/migraine. [name] on severe pain - neck, back. The patient presents for a follow-up of chronic HAs, depression, anxiety, hypertension, chronic dyslipidemia, type 1 diabetes. Problems with [name] at home; worse stress. C/o more anxiety - [facility] is helping at home  Wt Readings from Last 3 Encounters:  07/04/12 112 lb (50.803 kg)  06/10/12 112 lb (50.803 kg)  06/04/12 113 lb (51.256 kg)   BP Readings from Last 3 Encounters:  07/04/12 110/78  06/10/12 126/70  06/04/12 130/70      Review of Systems  Constitutional: Positive for fatigue. Negative for chills, activity change, appetite change and unexpected weight change.  HENT: Negative for congestion and mouth sores.   Eyes: Negative for visual disturbance.  Respiratory: Negative for chest tightness.   Gastrointestinal: Positive for nausea and vomiting.  Genitourinary: Negative for frequency, difficulty urinating and vaginal pain.  Musculoskeletal: Positive for arthralgias and gait problem.  Skin: Negative for pallor and rash.  Neurological: Negative for tremors.  Psychiatric/Behavioral: Negative for suicidal ideas, confusion, sleep disturbance and self-injury. The patient is nervous/anxious.        Objective:   Physical Exam  Constitutional: She appears well-developed and well-nourished. No distress.  HENT:  Head: Normocephalic.  Right Ear: External ear normal.  Left Ear: External ear normal.  Nose: Nose normal.  Mouth/Throat: Oropharynx is clear and moist.  Eyes: Conjunctivae are normal. Pupils are equal, round, and reactive to light. Right eye exhibits no discharge. Left eye exhibits no discharge.  Neck: Normal range of motion. Neck supple. No JVD present. No tracheal deviation present. No thyromegaly present.  Cardiovascular: Normal rate, regular rhythm and normal heart sounds.   Pulmonary/Chest: No stridor. No respiratory distress. She has no wheezes.  Abdominal: Soft. Bowel sounds are normal. She exhibits no distension and no mass. There is no tenderness. There is no rebound and no guarding.  Musculoskeletal: She exhibits no edema and no tenderness.  Lymphadenopathy:    She has no cervical adenopathy.  Neurological: She displays normal reflexes. No cranial nerve deficit. She exhibits normal muscle tone. Coordination normal.  Skin: Skin is warm. No rash noted. No erythema.  Psychiatric: Her behavior is normal. Judgment and thought content normal.  Depressed, not tearful     Lab Results  Component Value Date   WBC 8.7 09/06/2011   HGB 10.6* 09/06/2011   HCT 35.4* 09/06/2011   PLT 212.0 12/04/2010   GLUCOSE 87 05/21/2011   CHOL 226* 06/15/2010   TRIG 213.0* 06/15/2010   HDL 62.60 06/15/2010   LDLDIRECT 131.9 06/15/2010   LDLCALC 99 05/20/2006   ALT 21 05/21/2011   AST 20 05/21/2011   NA 139 05/21/2011   K 3.9 05/21/2011   CL 108 05/21/2011   CREATININE 0.8 05/21/2011  BUN 12 05/21/2011   CO2 23 05/21/2011   TSH 1.32 09/01/2010   INR 0.9 09/01/2010   HGBA1C 7.7* 04/11/2012   MICROALBUR 0.8 08/16/2011       Assessment & Plan:

## 2012-07-09 NOTE — Telephone Encounter (Signed)
Pt informed

## 2012-07-10 ENCOUNTER — Other Ambulatory Visit: Payer: Self-pay | Admitting: Internal Medicine

## 2012-07-31 ENCOUNTER — Ambulatory Visit: Payer: MEDICARE | Admitting: Internal Medicine

## 2012-08-12 ENCOUNTER — Ambulatory Visit: Payer: MEDICARE | Admitting: Endocrinology

## 2012-08-12 ENCOUNTER — Telehealth: Payer: Self-pay | Admitting: Internal Medicine

## 2012-08-12 MED ORDER — LORAZEPAM 2 MG PO TABS: 2.0000 mg | ORAL_TABLET | Freq: Three times a day (TID) | ORAL | Status: DC | PRN

## 2012-08-12 NOTE — Telephone Encounter (Signed)
Pt is requesting Lorazepam to be refilled.  She has changed pharmacies to [facility] in [place].  She has been out since Saturday.  Having withdrawal symptoms. She states it was faxed Saturday and Monday.

## 2012-08-12 NOTE — Telephone Encounter (Signed)
Done

## 2012-08-12 NOTE — Telephone Encounter (Signed)
Please advise on Lorazepam Rf.

## 2012-08-12 NOTE — Telephone Encounter (Signed)
OK to fill this prescription with additional refills x2 Thank you!  

## 2012-08-14 ENCOUNTER — Other Ambulatory Visit: Payer: Self-pay | Admitting: *Deleted

## 2012-08-14 MED ORDER — OMEPRAZOLE 40 MG PO CPDR: 40.0000 mg | DELAYED_RELEASE_CAPSULE | Freq: Two times a day (BID) | ORAL | Status: DC

## 2012-08-14 MED ORDER — PROCHLORPERAZINE MALEATE 10 MG PO TABS: 10.0000 mg | ORAL_TABLET | Freq: Three times a day (TID) | ORAL | Status: DC | PRN

## 2012-08-19 ENCOUNTER — Encounter: Payer: Self-pay | Admitting: Endocrinology

## 2012-08-19 ENCOUNTER — Ambulatory Visit (INDEPENDENT_AMBULATORY_CARE_PROVIDER_SITE_OTHER): Payer: MEDICARE | Admitting: Endocrinology

## 2012-08-19 VITALS — BP 134/72 | HR 75 | Ht 63.0 in | Wt 108.0 lb

## 2012-08-19 DIAGNOSIS — Z0271 Encounter for disability determination: Secondary | ICD-10-CM

## 2012-08-19 DIAGNOSIS — E109 Type 1 diabetes mellitus without complications: Secondary | ICD-10-CM

## 2012-08-19 NOTE — Patient Instructions (Addendum)
Please increase apidra to 11 units with breakfast 5 units with lunch and 6 units with evening meal, and: continue levemir, 8 units at bedtime.   If you vomit a meal, try to at least take some glucose tablets, to prevent low-blood sugar. check your blood sugar 4 times a day--before the 3 meals, and at bedtime.  also check if you have symptoms of your blood sugar being too high or too low.  please keep a record of the readings and bring it to your next appointment here.  please call us sooner if your blood sugar goes below 70, or if you have a lot of readings over 200.   Please come back for a follow-up appointment in 3 months.

## 2012-08-19 NOTE — Progress Notes (Signed)
Subjective:    Patient ID: Lindsey Gomez, female    DOB: 1945-07-21, 67 y.o.   MRN: [id number]  HPI Pt has h/o insulin-requiring DM (dx'ed 1990, in the context of a 40% pancreatectomy at [facility], for pancreatitis; she has mild if any neuropathy of the lower extremities; no known associated complications; she had an episode of severe hypoglycemia in September, 2013; she has never had DKA). she brings a record of his cbg's which i have reviewed today.  It varies from 111-300, but most are in the 100's.  There is no trend throughout the day, except it is slightly higher at lunch. Past Medical History  Diagnosis Date  . Depression   . Type II or unspecified type diabetes mellitus without mention of complication, not stated as uncontrolled   . HTN (hypertension)   . Chronic pain   . Fibromyalgia   . Migraines   . History of breast cancer     Right  . Chronic pancreatitis   . Vitamin D deficiency   . GERD (gastroesophageal reflux disease)   . Finger dislocation 2009    L 3rd  . COPD (chronic obstructive pulmonary disease)   . Anemia   . Fractured sternum 11/2008  . Osteoporosis     Reclast too expensive  . Barrett's esophagus   . Chronic fatigue   . Diverticulosis     Past Surgical History  Procedure Laterality Date  . Nasal sinus surgery    . Pancreatectomy      40%  . Cholecystectomy    . Appendectomy    . Abdominal hysterectomy      History   Social History  . Marital Status: Married    Spouse Name: [name]    Number of Children: N/A  . Years of Education: N/A   Occupational History  . 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 Smoking status: Current Every Day Smoker -- 2.00 packs/day  . Smokeless tobacco: Never Used  . Alcohol Use: No  . Drug Use: No  . Sexually Active: No   Other Topics Concern  . Not on file   Social History Narrative   Did not go back to work Oct 23, [name] is very ill - unable to have financial ends meet, no income       Regular Exercise -  NO      Coffee daily     Current Outpatient Prescriptions on File Prior to Visit  Medication Sig Dispense Refill  . ACCU-CHEK AVIVA PLUS test strip CHECK BLOOD SUGAR 4 TIMES A DAY.  100 each  6  . ACCU-CHEK SOFTCLIX LANCETS lancets Use as instructed  100 each  3  . amitriptyline (ELAVIL) 50 MG tablet Take 0.5-1 tablets (25-50 mg total) by mouth at bedtime.  60 tablet  5  . amLODipine-benazepril (LOTREL) 5-10 MG per capsule TAKE (1) CAPSULE DAILY.  30 capsule  5  . atenolol (TENORMIN) 100 MG tablet Take 1 tablet (100 mg total) by mouth every morning.  30 tablet  11  . atorvastatin (LIPITOR) 10 MG tablet Take 1 tablet (10 mg total) by mouth daily.  30 tablet  11  . calcitRIOL (ROCALTROL) 0.5 MCG capsule Take 2 capsules (1 mcg total) by mouth daily.  60 capsule  11  . diclofenac sodium (VOLTAREN) 1 % GEL Apply topically 4 (four) times daily as needed.        . eletriptan (RELPAX) 40 MG tablet  One tablet by mouth at onset of headache. May repeat in 2 hours if headache persists or recurs. may repeat in 2 hours if necessary  8 tablet  11  . escitalopram (LEXAPRO) 20 MG tablet Take 1 tablet (20 mg total) by mouth daily.  30 tablet  5  . glucagon 1 MG injection Inject 1 mg into the vein once as needed.  1 each  12  . hydrocortisone (CORTEF) 10 MG tablet Take 1 tablet (10 mg total) by mouth 2 (two) times daily. Morning and at lunch  60 tablet  11  . insulin detemir (LEVEMIR) 100 UNIT/ML injection Inject 8 Units into the skin at bedtime.       . insulin glulisine (APIDRA) 100 UNIT/ML injection Inject subcutaneously 11 units with breakfast 5 units with lunch and 6 units with evening meal  9 mL  0  . Insulin Pen Needle (B-D ULTRAFINE III SHORT PEN) 31G X 8 MM MISC Use as directed 5 times daily  200 each  11  . Ketorolac Tromethamine (SPRIX) 15.75 MG/SPRAY SOLN 1 spray by Nasal route 3 (three) times daily as needed.        [name] LORazepam (ATIVAN) 2 MG tablet Take 1 tablet (2 mg total)  by mouth every 8 (eight) hours as needed for anxiety.  90 tablet  2  . mometasone (NASONEX) 50 MCG/ACT nasal spray Place 2 sprays into the nose daily.  17 g  11  . omeprazole (PRILOSEC) 40 MG capsule Take 1 capsule (40 mg total) by mouth 2 (two) times daily.  180 capsule  1  . oxyCODONE (OXYCONTIN) 20 MG 12 hr tablet Take 1 tablet (20 mg total) by mouth every 12 (twelve) hours. Please fill on or after 08/26/12  60 tablet  0  . oxyCODONE (ROXICODONE) 15 MG immediate release tablet Take 1 tablet (15 mg total) by mouth every 8 (eight) hours as needed for pain.  90 tablet  0  . prochlorperazine (COMPAZINE) 10 MG tablet Take 1 tablet (10 mg total) by mouth every 8 (eight) hours as needed.  180 tablet  1  . ranitidine (ZANTAC) 300 MG tablet Take 1 tablet (300 mg total) by mouth at bedtime.  30 tablet  11  . tamoxifen (NOLVADEX) 20 MG tablet Take 20 mg by mouth 2 (two) times daily.        [name] topiramate (TOPAMAX) 200 MG tablet Take 1 tablet (200 mg total) by mouth 2 (two) times daily.  60 tablet  5  . triamcinolone (KENALOG) 0.1 % ointment Apply topically 2 (two) times daily as needed.        . zolmitriptan (ZOMIG) 5 MG tablet Take 1 tablet (5 mg total) by mouth daily as needed for migraine.  8 tablet  5   Current Facility-Administered Medications on File Prior to Visit  Medication Dose Route Frequency Provider Last Rate Last Dose  . 0.9 %  sodium chloride infusion  500 mL Intravenous Continuous Louis Meckel, MD      . methylPREDNISolone acetate (DEPO-MEDROL) injection 40 mg  40 mg Intra-articular Once [name], MD        Allergies  Allergen Reactions  . Gabapentin     Weak muscles  . Milnacipran     REACTION: HA and hand swelling  . Moxifloxacin   . Rizatriptan Benzoate     REACTION: n/v  . Sulfonamide Derivatives   . Venlafaxine     Family History  Problem Relation Age of Onset  .  Colon cancer Neg Hx   . COPD Father   . Heart disease Father   . Leukemia Mother   . Diabetes  Maternal Uncle     BP 134/72  Pulse 75  Ht 5\' 3"  (1.6 m)  Wt 108 lb (48.988 kg)  BMI 19.14 kg/m2  SpO2 97%   Review of Systems denies hypoglycemia and weight change    Objective:   Physical Exam VITAL SIGNS:  See vs page GENERAL: no distress  Lab Results  Component Value Date   HGBA1C 7.4* 07/04/2012      Assessment & Plan:  DM: This insulin regimen was chosen from multiple options, as it best matches his insulin to his changing requirements throughout the day.  The benefits of glycemic control must be weighed against the risks of hypoglycemia.

## 2012-08-22 ENCOUNTER — Emergency Department (HOSPITAL_BASED_OUTPATIENT_CLINIC_OR_DEPARTMENT_OTHER): Payer: MEDICARE

## 2012-08-22 ENCOUNTER — Emergency Department (HOSPITAL_BASED_OUTPATIENT_CLINIC_OR_DEPARTMENT_OTHER)
Admission: EM | Admit: 2012-08-22 | Discharge: 2012-08-22 | Disposition: A | Discharge status: Home / Self Care | Payer: MEDICARE | Attending: Emergency Medicine | Admitting: Emergency Medicine

## 2012-08-22 ENCOUNTER — Encounter (HOSPITAL_BASED_OUTPATIENT_CLINIC_OR_DEPARTMENT_OTHER): Payer: Self-pay

## 2012-08-22 DIAGNOSIS — Z8781 Personal history of (healed) traumatic fracture: Secondary | ICD-10-CM | POA: Insufficient documentation

## 2012-08-22 DIAGNOSIS — Y9389 Activity, other specified: Secondary | ICD-10-CM | POA: Insufficient documentation

## 2012-08-22 DIAGNOSIS — G43909 Migraine, unspecified, not intractable, without status migrainosus: Secondary | ICD-10-CM | POA: Insufficient documentation

## 2012-08-22 DIAGNOSIS — W1809XA Striking against other object with subsequent fall, initial encounter: Secondary | ICD-10-CM | POA: Insufficient documentation

## 2012-08-22 DIAGNOSIS — I10 Essential (primary) hypertension: Secondary | ICD-10-CM | POA: Insufficient documentation

## 2012-08-22 DIAGNOSIS — Z90710 Acquired absence of both cervix and uterus: Secondary | ICD-10-CM | POA: Insufficient documentation

## 2012-08-22 DIAGNOSIS — Z79899 Other long term (current) drug therapy: Secondary | ICD-10-CM | POA: Insufficient documentation

## 2012-08-22 DIAGNOSIS — Z853 Personal history of malignant neoplasm of breast: Secondary | ICD-10-CM | POA: Insufficient documentation

## 2012-08-22 DIAGNOSIS — F17200 Nicotine dependence, unspecified, uncomplicated: Secondary | ICD-10-CM | POA: Insufficient documentation

## 2012-08-22 DIAGNOSIS — K219 Gastro-esophageal reflux disease without esophagitis: Secondary | ICD-10-CM | POA: Insufficient documentation

## 2012-08-22 DIAGNOSIS — W010XXA Fall on same level from slipping, tripping and stumbling without subsequent striking against object, initial encounter: Secondary | ICD-10-CM | POA: Insufficient documentation

## 2012-08-22 DIAGNOSIS — Z8719 Personal history of other diseases of the digestive system: Secondary | ICD-10-CM | POA: Insufficient documentation

## 2012-08-22 DIAGNOSIS — J4489 Other specified chronic obstructive pulmonary disease: Secondary | ICD-10-CM | POA: Insufficient documentation

## 2012-08-22 DIAGNOSIS — F329 Major depressive disorder, single episode, unspecified: Secondary | ICD-10-CM | POA: Insufficient documentation

## 2012-08-22 DIAGNOSIS — S0003XA Contusion of scalp, initial encounter: Secondary | ICD-10-CM | POA: Insufficient documentation

## 2012-08-22 DIAGNOSIS — G8929 Other chronic pain: Secondary | ICD-10-CM | POA: Insufficient documentation

## 2012-08-22 DIAGNOSIS — D638 Anemia in other chronic diseases classified elsewhere: Secondary | ICD-10-CM | POA: Insufficient documentation

## 2012-08-22 DIAGNOSIS — Z9089 Acquired absence of other organs: Secondary | ICD-10-CM | POA: Insufficient documentation

## 2012-08-22 DIAGNOSIS — J449 Chronic obstructive pulmonary disease, unspecified: Secondary | ICD-10-CM | POA: Insufficient documentation

## 2012-08-22 DIAGNOSIS — E119 Type 2 diabetes mellitus without complications: Secondary | ICD-10-CM | POA: Insufficient documentation

## 2012-08-22 DIAGNOSIS — S3981XA Other specified injuries of abdomen, initial encounter: Secondary | ICD-10-CM | POA: Insufficient documentation

## 2012-08-22 DIAGNOSIS — S20229A Contusion of unspecified back wall of thorax, initial encounter: Secondary | ICD-10-CM | POA: Insufficient documentation

## 2012-08-22 DIAGNOSIS — T148XXA Other injury of unspecified body region, initial encounter: Secondary | ICD-10-CM

## 2012-08-22 DIAGNOSIS — Z8739 Personal history of other diseases of the musculoskeletal system and connective tissue: Secondary | ICD-10-CM | POA: Insufficient documentation

## 2012-08-22 DIAGNOSIS — F3289 Other specified depressive episodes: Secondary | ICD-10-CM | POA: Insufficient documentation

## 2012-08-22 DIAGNOSIS — Y92009 Unspecified place in unspecified non-institutional (private) residence as the place of occurrence of the external cause: Secondary | ICD-10-CM | POA: Insufficient documentation

## 2012-08-22 DIAGNOSIS — Z8639 Personal history of other endocrine, nutritional and metabolic disease: Secondary | ICD-10-CM | POA: Insufficient documentation

## 2012-08-22 DIAGNOSIS — Z87828 Personal history of other (healed) physical injury and trauma: Secondary | ICD-10-CM | POA: Insufficient documentation

## 2012-08-22 DIAGNOSIS — Z794 Long term (current) use of insulin: Secondary | ICD-10-CM | POA: Insufficient documentation

## 2012-08-22 DIAGNOSIS — E876 Hypokalemia: Secondary | ICD-10-CM | POA: Insufficient documentation

## 2012-08-22 DIAGNOSIS — IMO0002 Reserved for concepts with insufficient information to code with codable children: Secondary | ICD-10-CM | POA: Insufficient documentation

## 2012-08-22 HISTORY — DX: Opioid dependence, uncomplicated: F11.20

## 2012-08-22 LAB — COMPREHENSIVE METABOLIC PANEL
ALT: 13 U/L (ref 0–35)
AST: 15 U/L (ref 0–37)
Albumin: 3.6 g/dL (ref 3.5–5.2)
CO2: 24 mEq/L (ref 19–32)
Calcium: 11.8 mg/dL — ABNORMAL HIGH (ref 8.4–10.5)
Chloride: 102 mEq/L (ref 96–112)
GFR calc non Af Amer: 33 mL/min — ABNORMAL LOW (ref >90)
Sodium: 139 mEq/L (ref 135–145)
Total Bilirubin: 0.2 mg/dL — ABNORMAL LOW (ref 0.3–1.2)

## 2012-08-22 LAB — URINALYSIS, ROUTINE W REFLEX MICROSCOPIC
Bilirubin Urine: NEGATIVE
Glucose, UA: NEGATIVE mg/dL
Hgb urine dipstick: NEGATIVE
Specific Gravity, Urine: 1.013 (ref 1.005–1.030)
Urobilinogen, UA: 0.2 mg/dL (ref 0.0–1.0)

## 2012-08-22 LAB — CBC WITH DIFFERENTIAL/PLATELET
Basophils Absolute: 0 10*3/uL (ref 0.0–0.1)
Basophils Relative: 0 % (ref 0–1)
Lymphocytes Relative: 21 % (ref 12–46)
MCHC: 33.9 g/dL (ref 30.0–36.0)
Neutro Abs: 6.8 10*3/uL (ref 1.7–7.7)
Neutrophils Relative %: 71 % (ref 43–77)
Platelets: 148 10*3/uL — ABNORMAL LOW (ref 150–400)
RDW: 14.3 % (ref 11.5–15.5)
WBC: 9.6 10*3/uL (ref 4.0–10.5)

## 2012-08-22 LAB — URINE MICROSCOPIC-ADD ON

## 2012-08-22 LAB — CK TOTAL AND CKMB (NOT AT ARMC): Total CK: 182 U/L — ABNORMAL HIGH (ref 7–177)

## 2012-08-22 LAB — RAPID URINE DRUG SCREEN, HOSP PERFORMED
Amphetamines: NOT DETECTED
Barbiturates: NOT DETECTED
Cocaine: NOT DETECTED
Opiates: NOT DETECTED
Tetrahydrocannabinol: NOT DETECTED

## 2012-08-22 MED ORDER — SODIUM CHLORIDE 0.9 % IV BOLUS (SEPSIS)
500.0000 mL | Freq: Once | INTRAVENOUS | Status: AC
  Administered 2012-08-22: 18:00:00 via INTRAVENOUS

## 2012-08-22 MED ORDER — SODIUM CHLORIDE 0.9 % IV BOLUS (SEPSIS): 1000.0000 mL | Freq: Once | INTRAVENOUS | Status: DC

## 2012-08-22 NOTE — ED Notes (Signed)
Pt ambulated well and spo2 stayed a 97% and Heart rate at 70 and states will ambulating she felt well.

## 2012-08-22 NOTE — ED Provider Notes (Addendum)
History     CSN: [id number]  Arrival date & time 08/22/12  1703   First MD Initiated Contact with Patient 08/22/12 1716      No chief complaint on file.  Level 5- patient with some amnesia around event and confused (Consider location/radiation/quality/duration/timing/severity/associated sxs/prior treatment) HPI 67 y.o. Female with multiple health problems who was found on ground in yard after unclear down time with reported fall.  Patient states trying to cut small tree down.  Neighbor reports some increasing confusion over several days to a week.  Report that patient had difficulty walking.  Patient states fell hitting back of head and back and was unable to get up.  She denies other problems.  Patient found when home health care came out for regular visit with patient's husband.  Past Medical History  Diagnosis Date  . Depression   . Type II or unspecified type diabetes mellitus without mention of complication, not stated as uncontrolled   . HTN (hypertension)   . Chronic pain   . Fibromyalgia   . Migraines   . History of breast cancer     Right  . Chronic pancreatitis   . Vitamin D deficiency   . GERD (gastroesophageal reflux disease)   . Finger dislocation 2009    L 3rd  . COPD (chronic obstructive pulmonary disease)   . Anemia   . Fractured sternum 11/2008  . Osteoporosis     Reclast too expensive  . Barrett's esophagus   . Chronic fatigue   . Diverticulosis     Past Surgical History  Procedure Laterality Date  . Nasal sinus surgery    . Pancreatectomy      40%  . Cholecystectomy    . Appendectomy    . Abdominal hysterectomy      Family History  Problem Relation Age of Onset  . Colon cancer Neg Hx   . COPD Father   . Heart disease Father   . Leukemia Mother   . Diabetes Maternal Uncle     History  Substance Use Topics  . Smoking status: Current Every Day Smoker -- 2.00 packs/day  . Smokeless tobacco: Never Used  . Alcohol Use: No    OB History   Grav Para Term Preterm Abortions TAB SAB Ect Mult Living                  Review of Systems  Allergies  Gabapentin; Milnacipran; Moxifloxacin; Rizatriptan benzoate; Sulfonamide derivatives; and Venlafaxine  Home Medications   Current Outpatient Rx  Name  Route  Sig  Dispense  Refill  . ACCU-CHEK AVIVA PLUS test strip      CHECK BLOOD SUGAR 4 TIMES A DAY.   100 each   6     DIAGNOSIS CODE: 250.01   . ACCU-CHEK SOFTCLIX LANCETS lancets      Use as instructed   100 each   3   . amitriptyline (ELAVIL) 50 MG tablet   Oral   Take 0.5-1 tablets (25-50 mg total) by mouth at bedtime.   60 tablet   5   . amLODipine-benazepril (LOTREL) 5-10 MG per capsule      TAKE (1) CAPSULE DAILY.   30 capsule   5   . atenolol (TENORMIN) 100 MG tablet   Oral   Take 1 tablet (100 mg total) by mouth every morning.   30 tablet   11   . atorvastatin (LIPITOR) 10 MG tablet   Oral   Take 1 tablet (10 mg  total) by mouth daily.   30 tablet   11   . calcitRIOL (ROCALTROL) 0.5 MCG capsule   Oral   Take 2 capsules (1 mcg total) by mouth daily.   60 capsule   11   . diclofenac sodium (VOLTAREN) 1 % GEL   Topical   Apply topically 4 (four) times daily as needed.           . eletriptan (RELPAX) 40 MG tablet   Oral   One tablet by mouth at onset of headache. May repeat in 2 hours if headache persists or recurs. may repeat in 2 hours if necessary   8 tablet   11   . escitalopram (LEXAPRO) 20 MG tablet   Oral   Take 1 tablet (20 mg total) by mouth daily.   30 tablet   5   . glucagon 1 MG injection   Intravenous   Inject 1 mg into the vein once as needed.   1 each   12   . hydrocortisone (CORTEF) 10 MG tablet   Oral   Take 1 tablet (10 mg total) by mouth 2 (two) times daily. Morning and at lunch   60 tablet   11   . insulin detemir (LEVEMIR) 100 UNIT/ML injection   Subcutaneous   Inject 8 Units into the skin at bedtime.          . insulin glulisine (APIDRA) 100  UNIT/ML injection      Inject subcutaneously 11 units with breakfast 5 units with lunch and 6 units with evening meal   9 mL   0   . Insulin Pen Needle (B-D ULTRAFINE III SHORT PEN) 31G X 8 MM MISC      Use as directed 5 times daily   200 each   11   . Ketorolac Tromethamine (SPRIX) 15.75 MG/SPRAY SOLN   Nasal   1 spray by Nasal route 3 (three) times daily as needed.           [name] LORazepam (ATIVAN) 2 MG tablet   Oral   Take 1 tablet (2 mg total) by mouth every 8 (eight) hours as needed for anxiety.   90 tablet   2   . mometasone (NASONEX) 50 MCG/ACT nasal spray   Nasal   Place 2 sprays into the nose daily.   17 g   11   . omeprazole (PRILOSEC) 40 MG capsule   Oral   Take 1 capsule (40 mg total) by mouth 2 (two) times daily.   180 capsule   1   . oxyCODONE (OXYCONTIN) 20 MG 12 hr tablet   Oral   Take 1 tablet (20 mg total) by mouth every 12 (twelve) hours. Please fill on or after 08/26/12   60 tablet   0   . oxyCODONE (ROXICODONE) 15 MG immediate release tablet   Oral   Take 1 tablet (15 mg total) by mouth every 8 (eight) hours as needed for pain.   90 tablet   0   . prochlorperazine (COMPAZINE) 10 MG tablet   Oral   Take 1 tablet (10 mg total) by mouth every 8 (eight) hours as needed.   180 tablet   1   . ranitidine (ZANTAC) 300 MG tablet   Oral   Take 1 tablet (300 mg total) by mouth at bedtime.   30 tablet   11   . tamoxifen (NOLVADEX) 20 MG tablet   Oral   Take 20 mg by mouth 2 (two)  times daily.           [name] topiramate (TOPAMAX) 200 MG tablet   Oral   Take 1 tablet (200 mg total) by mouth 2 (two) times daily.   60 tablet   5   . triamcinolone (KENALOG) 0.1 % ointment   Topical   Apply topically 2 (two) times daily as needed.           . zolmitriptan (ZOMIG) 5 MG tablet   Oral   Take 1 tablet (5 mg total) by mouth daily as needed for migraine.   8 tablet   5     BP 109/69  Pulse 69  Temp(Src) 98.6 F (37 C) (Oral)  Resp 20   SpO2 96%  Physical Exam  Nursing note and vitals reviewed. Constitutional: She is oriented to person, place, and time. She appears well-developed and well-nourished.  HENT:  Head: Normocephalic.  Right Ear: External ear normal.  Left Ear: External ear normal.  Nose: Nose normal.  Mouth/Throat: Oropharynx is clear and moist.  Possible small occiput contusion, no palpable abnormality, laceration or deformity  Eyes:  Right pupil 2 left 3, eom intact  Neck: Normal range of motion. Neck supple. No tracheal deviation present. No thyromegaly present.  Cardiovascular: Normal rate and normal heart sounds.   Pulmonary/Chest: Effort normal and breath sounds normal.  Abdominal: Soft. Bowel sounds are normal.  Mild diffuse ttp on palpation.   Musculoskeletal: Normal range of motion. She exhibits no edema and no tenderness.  Right upper back with contusion extending down to right mid back  Neurological: She is alert and oriented to person, place, and time. She has normal strength. No sensory deficit. GCS eye subscore is 4. GCS verbal subscore is 5. GCS motor subscore is 6.  Reflex Scores:      Tricep reflexes are 1+ on the right side and 1+ on the left side.      Bicep reflexes are 1+ on the right side and 1+ on the left side.      Brachioradialis reflexes are 1+ on the right side and 1+ on the left side.      Patellar reflexes are 3+ on the right side and 3+ on the left side. Faces appears stiff, no trauma noted    ED Course  Procedures (including critical care time)  Labs Reviewed - No data to display No results found.   No diagnosis found.   Results for orders placed during the hospital encounter of 08/22/12  CBC WITH DIFFERENTIAL      Result Value Range   WBC 9.6  4.0 - 10.5 K/uL   RBC 3.51 (*) 3.87 - 5.11 MIL/uL   Hemoglobin 10.6 (*) 12.0 - 15.0 g/dL   HCT 16.1 (*) 09.6 - 04.5 %   MCV 89.2  78.0 - 100.0 fL   MCH 30.2  26.0 - 34.0 pg   MCHC 33.9  30.0 - 36.0 g/dL   RDW 40.9   81.1 - 91.4 %   Platelets 148 (*) 150 - 400 K/uL   Neutrophils Relative % 71  43 - 77 %   Neutro Abs 6.8  1.7 - 7.7 K/uL   Lymphocytes Relative 21  12 - 46 %   Lymphs Abs 2.0  0.7 - 4.0 K/uL   Monocytes Relative 8  3 - 12 %   Monocytes Absolute 0.8  0.1 - 1.0 K/uL   Eosinophils Relative 1  0 - 5 %   Eosinophils Absolute 0.1  0.0 -  0.7 K/uL   Basophils Relative 0  0 - 1 %   Basophils Absolute 0.0  0.0 - 0.1 K/uL  COMPREHENSIVE METABOLIC PANEL      Result Value Range   Sodium 139  135 - 145 mEq/L   Potassium 3.3 (*) 3.5 - 5.1 mEq/L   Chloride 102  96 - 112 mEq/L   CO2 24  19 - 32 mEq/L   Glucose, Bld 157 (*) 70 - 99 mg/dL   BUN 39 (*) 6 - 23 mg/dL   Creatinine, Ser 4.54 (*) 0.50 - 1.10 mg/dL   Calcium 09.8 (*) 8.4 - 10.5 mg/dL   Total Protein 6.6  6.0 - 8.3 g/dL   Albumin 3.6  3.5 - 5.2 g/dL   AST 15  0 - 37 U/L   ALT 13  0 - 35 U/L   Alkaline Phosphatase 83  39 - 117 U/L   Total Bilirubin 0.2 (*) 0.3 - 1.2 mg/dL   GFR calc non Af Amer 33 (*) >90 mL/min   GFR calc Af Amer 38 (*) >90 mL/min  URINALYSIS, ROUTINE W REFLEX MICROSCOPIC      Result Value Range   Color, Urine YELLOW  YELLOW   APPearance CLOUDY (*) CLEAR   Specific Gravity, Urine 1.013  1.005 - 1.030   pH 7.0  5.0 - 8.0   Glucose, UA NEGATIVE  NEGATIVE mg/dL   Hgb urine dipstick NEGATIVE  NEGATIVE   Bilirubin Urine NEGATIVE  NEGATIVE   Ketones, ur NEGATIVE  NEGATIVE mg/dL   Protein, ur NEGATIVE  NEGATIVE mg/dL   Urobilinogen, UA 0.2  0.0 - 1.0 mg/dL   Nitrite NEGATIVE  NEGATIVE   Leukocytes, UA TRACE (*) NEGATIVE  URINE RAPID DRUG SCREEN (HOSP PERFORMED)      Result Value Range   Opiates NONE DETECTED  NONE DETECTED   Cocaine NONE DETECTED  NONE DETECTED   Benzodiazepines NONE DETECTED  NONE DETECTED   Amphetamines NONE DETECTED  NONE DETECTED   Tetrahydrocannabinol NONE DETECTED  NONE DETECTED   Barbiturates NONE DETECTED  NONE DETECTED  ETHANOL      Result Value Range   Alcohol, Ethyl (B) <11  0 - 11  mg/dL  TROPONIN I      Result Value Range   Troponin I <0.30  <0.30 ng/mL  CK TOTAL AND CKMB      Result Value Range   Total CK 182 (*) 7 - 177 U/L   CK, MB 3.8  0.3 - 4.0 ng/mL   Relative Index 2.1  0.0 - 2.5  URINE MICROSCOPIC-ADD ON      Result Value Range   Squamous Epithelial / LPF FEW (*) RARE   WBC, UA 0-2  <3 WBC/hpf   Bacteria, UA RARE  RARE    Date: 08/22/2012  Rate: 71  Rhythm: normal sinus rhythm  QRS Axis: normal  Intervals: PR shortened  ST/T Wave abnormalities: nonspecific ST/T changes  Conduction Disutrbances:none  Narrative Interpretation:   Old EKG Reviewed: t waves flattened from first prior of feb 2007    1- fall- patient without fracture, evidence of head injury,  2- volume depletion- patient with elevated bun and creatinine but iv fluids given and patient taking po without difficulty. 3- anemia- hgb 10.6 stable from prior of 2013 4-hypokalemia- mild- patient will orally replete with foods.  Patient wishes d/c to home.  Ambulated here without difficulty.  She is advised to orally hydrate and follow up with her pmd for recheck of labs.  Hilario Quarry, MD 08/22/12 2130  Hilario Quarry, MD 08/22/12 2026

## 2012-08-22 NOTE — ED Notes (Signed)
MD Ray received EKG prinouts

## 2012-08-22 NOTE — ED Notes (Signed)
Ambulated Pt to the restroom

## 2012-08-22 NOTE — ED Notes (Signed)
Pt was doing yard work, tripped and fell striking head and found lying on the ground by a home health nurse- Denies LOC- Pt has an unsteady gait and a change in mentation

## 2012-08-22 NOTE — ED Notes (Signed)
Patient transported to CT 

## 2012-08-29 ENCOUNTER — Encounter: Payer: Self-pay | Admitting: Internal Medicine

## 2012-08-29 ENCOUNTER — Ambulatory Visit (INDEPENDENT_AMBULATORY_CARE_PROVIDER_SITE_OTHER): Payer: MEDICARE | Admitting: Internal Medicine

## 2012-08-29 VITALS — BP 120/82 | HR 76 | Temp 98.1°F | Resp 16 | Wt 108.0 lb

## 2012-08-29 DIAGNOSIS — Z9181 History of falling: Secondary | ICD-10-CM

## 2012-08-29 DIAGNOSIS — F411 Generalized anxiety disorder: Secondary | ICD-10-CM

## 2012-08-29 DIAGNOSIS — E109 Type 1 diabetes mellitus without complications: Secondary | ICD-10-CM

## 2012-08-29 DIAGNOSIS — IMO0001 Reserved for inherently not codable concepts without codable children: Secondary | ICD-10-CM

## 2012-08-29 DIAGNOSIS — R296 Repeated falls: Secondary | ICD-10-CM

## 2012-08-29 DIAGNOSIS — F329 Major depressive disorder, single episode, unspecified: Secondary | ICD-10-CM

## 2012-08-29 MED ORDER — OXYCODONE HCL 15 MG PO TABS: 15.0000 mg | ORAL_TABLET | Freq: Three times a day (TID) | ORAL | Status: DC | PRN

## 2012-08-29 MED ORDER — OXYCODONE HCL 20 MG PO TB12: 20.0000 mg | ORAL_TABLET | Freq: Two times a day (BID) | ORAL | Status: DC

## 2012-08-29 NOTE — Progress Notes (Signed)
Subjective:    Migraine  This is a chronic problem. The current episode started more than 1 year ago. The problem occurs daily. The problem has been unchanged. The pain is located in the bilateral region. The pain radiates to the right neck and left neck. The pain quality is similar to prior headaches. The quality of the pain is described as shooting. The pain is at a severity of 7/10. The pain is severe. Associated symptoms include muscle aches, nausea and vomiting. The symptoms are aggravated by fatigue. Her past medical history is significant for cancer and migraine headaches.   Not better. Gabapentin caused side effects - Amaiyah stopped it a while ago...  [name] severe HA's - migraines. She was vomiting this am w/migraine. [name] on severe pain - neck, back. The patient presents for a follow-up of chronic HAs, depression, anxiety, hypertension, chronic dyslipidemia, type 1 diabetes. Problems with [name] at home; worse stress. C/o more anxiety - [facility] is helping at home  She fell outside while trimming shrubbs  Wt Readings from Last 3 Encounters:  08/29/12 108 lb (48.988 kg)  08/19/12 108 lb (48.988 kg)  07/04/12 112 lb (50.803 kg)   BP Readings from Last 3 Encounters:  08/29/12 120/82  08/22/12 111/75  08/19/12 134/72      Review of Systems  Constitutional: Positive for fatigue. Negative for chills, activity change, appetite change and unexpected weight change.  HENT: Negative for congestion and mouth sores.   Eyes: Negative for visual disturbance.  Respiratory: Negative for chest tightness.   Gastrointestinal: Positive for nausea and vomiting.  Genitourinary: Negative for frequency, difficulty urinating and vaginal pain.  Musculoskeletal: Positive for arthralgias and gait problem.  Skin: Negative for pallor and rash.  Neurological: Negative for tremors.  Psychiatric/Behavioral: Negative for suicidal ideas, confusion, sleep disturbance and self-injury. The patient is  nervous/anxious.        Objective:   Physical Exam  Constitutional: She appears well-developed and well-nourished. No distress.  HENT:  Head: Normocephalic.  Right Ear: External ear normal.  Left Ear: External ear normal.  Nose: Nose normal.  Mouth/Throat: Oropharynx is clear and moist.  Eyes: Conjunctivae are normal. Pupils are equal, round, and reactive to light. Right eye exhibits no discharge. Left eye exhibits no discharge.  Neck: Normal range of motion. Neck supple. No JVD present. No tracheal deviation present. No thyromegaly present.  Cardiovascular: Normal rate, regular rhythm and normal heart sounds.   Pulmonary/Chest: No stridor. No respiratory distress. She has no wheezes.  Abdominal: Soft. Bowel sounds are normal. She exhibits no distension and no mass. There is no tenderness. There is no rebound and no guarding.  Musculoskeletal: She exhibits no edema and no tenderness.  Lymphadenopathy:    She has no cervical adenopathy.  Neurological: She displays normal reflexes. No cranial nerve deficit. She exhibits normal muscle tone. Coordination normal.  Skin: Skin is warm. No rash noted. No erythema.  Psychiatric: Her behavior is normal. Judgment and thought content normal.  Depressed, not tearful     Lab Results  Component Value Date   WBC 9.6 08/22/2012   HGB 10.6* 08/22/2012   HCT 31.3* 08/22/2012   PLT 148* 08/22/2012   GLUCOSE 157* 08/22/2012   CHOL 226* 06/15/2010   TRIG 213.0* 06/15/2010   HDL 62.60 06/15/2010   LDLDIRECT 131.9 06/15/2010   LDLCALC 99 05/20/2006   ALT 13 08/22/2012   AST 15 08/22/2012   NA 139 08/22/2012   K 3.3* 08/22/2012   CL 102 08/22/2012  CREATININE 1.60* 08/22/2012   BUN 39* 08/22/2012   CO2 24 08/22/2012   TSH 1.32 09/01/2010   INR 0.9 09/01/2010   HGBA1C 7.4* 07/04/2012   MICROALBUR 0.8 08/16/2011       Assessment & Plan:

## 2012-08-29 NOTE — Assessment & Plan Note (Signed)
Continue with current prescription therapy as reflected on the Med list.  

## 2012-08-29 NOTE — Assessment & Plan Note (Signed)
Better  

## 2012-08-29 NOTE — Assessment & Plan Note (Signed)
Discussed.

## 2012-09-02 ENCOUNTER — Telehealth: Payer: Self-pay | Admitting: Internal Medicine

## 2012-09-02 NOTE — Telephone Encounter (Signed)
Received 4 pages from Disability Determination Services, sent to Dr. Posey Rea. 09/02/12/ss

## 2012-09-22 ENCOUNTER — Encounter: Payer: Self-pay | Admitting: Internal Medicine

## 2012-09-22 ENCOUNTER — Ambulatory Visit (INDEPENDENT_AMBULATORY_CARE_PROVIDER_SITE_OTHER): Payer: MEDICARE | Admitting: Internal Medicine

## 2012-09-22 VITALS — BP 92/70 | HR 68 | Temp 98.2°F | Resp 16 | Wt 107.0 lb

## 2012-09-22 DIAGNOSIS — I10 Essential (primary) hypertension: Secondary | ICD-10-CM

## 2012-09-22 DIAGNOSIS — G43909 Migraine, unspecified, not intractable, without status migrainosus: Secondary | ICD-10-CM

## 2012-09-22 DIAGNOSIS — F411 Generalized anxiety disorder: Secondary | ICD-10-CM

## 2012-09-22 DIAGNOSIS — E109 Type 1 diabetes mellitus without complications: Secondary | ICD-10-CM

## 2012-09-22 DIAGNOSIS — G43109 Migraine with aura, not intractable, without status migrainosus: Secondary | ICD-10-CM

## 2012-09-22 MED ORDER — OXYCODONE HCL 15 MG PO TABS: 15.0000 mg | ORAL_TABLET | Freq: Three times a day (TID) | ORAL | Status: DC | PRN

## 2012-09-22 MED ORDER — OXYCODONE HCL 20 MG PO TB12: 20.0000 mg | ORAL_TABLET | Freq: Two times a day (BID) | ORAL | Status: DC

## 2012-09-22 NOTE — Assessment & Plan Note (Signed)
Continue with current prescription therapy as reflected on the Med list.  

## 2012-09-22 NOTE — Progress Notes (Signed)
Subjective:    Migraine  This is a chronic problem. The current episode started more than 1 year ago. The problem occurs daily. The problem has been unchanged. The pain is located in the bilateral region. The pain radiates to the right neck and left neck. The pain quality is similar to prior headaches. The quality of the pain is described as shooting. The pain is at a severity of 7/10. The pain is severe. Associated symptoms include muscle aches, nausea and vomiting. The symptoms are aggravated by fatigue. Her past medical history is significant for cancer and migraine headaches.   Not better. Gabapentin caused side effects - Annette stopped it a while ago... Trang denies OSA [name] severe HA's - migraines. She was vomiting this am w/migraine. [name] on severe pain - neck, back.  The patient presents for a follow-up of chronic HAs, depression, anxiety, hypertension, chronic dyslipidemia, type 1 diabetes.  Problems with [name] at home; worse stress. C/o more anxiety; depressed - [facility] is helping at home    Wt Readings from Last 3 Encounters:  09/22/12 107 lb (48.535 kg)  08/29/12 108 lb (48.988 kg)  08/19/12 108 lb (48.988 kg)   BP Readings from Last 3 Encounters:  09/22/12 92/70  08/29/12 120/82  08/22/12 111/75      Review of Systems  Constitutional: Positive for fatigue. Negative for chills, activity change, appetite change and unexpected weight change.  HENT: Negative for congestion and mouth sores.   Eyes: Negative for visual disturbance.  Respiratory: Negative for chest tightness.   Gastrointestinal: Positive for nausea and vomiting.  Genitourinary: Negative for frequency, difficulty urinating and vaginal pain.  Musculoskeletal: Positive for arthralgias and gait problem.  Skin: Negative for pallor and rash.  Neurological: Negative for tremors.  Psychiatric/Behavioral: Negative for suicidal ideas, confusion, sleep disturbance and self-injury. The patient is nervous/anxious.         Objective:   Physical Exam  Constitutional: She appears well-developed and well-nourished. No distress.  HENT:  Head: Normocephalic.  Right Ear: External ear normal.  Left Ear: External ear normal.  Nose: Nose normal.  Mouth/Throat: Oropharynx is clear and moist.  Eyes: Conjunctivae are normal. Pupils are equal, round, and reactive to light. Right eye exhibits no discharge. Left eye exhibits no discharge.  Neck: Normal range of motion. Neck supple. No JVD present. No tracheal deviation present. No thyromegaly present.  Cardiovascular: Normal rate, regular rhythm and normal heart sounds.   Pulmonary/Chest: No stridor. No respiratory distress. She has no wheezes.  Abdominal: Soft. Bowel sounds are normal. She exhibits no distension and no mass. There is no tenderness. There is no rebound and no guarding.  Musculoskeletal: She exhibits no edema and no tenderness.  Lymphadenopathy:    She has no cervical adenopathy.  Neurological: She displays normal reflexes. No cranial nerve deficit. She exhibits normal muscle tone. Coordination normal.  Skin: Skin is warm. No rash noted. No erythema.  Psychiatric: Her behavior is normal. Judgment and thought content normal.  Not depressed, not tearful - looks better     Lab Results  Component Value Date   WBC 9.6 08/22/2012   HGB 10.6* 08/22/2012   HCT 31.3* 08/22/2012   PLT 148* 08/22/2012   GLUCOSE 157* 08/22/2012   CHOL 226* 06/15/2010   TRIG 213.0* 06/15/2010   HDL 62.60 06/15/2010   LDLDIRECT 131.9 06/15/2010   LDLCALC 99 05/20/2006   ALT 13 08/22/2012   AST 15 08/22/2012   NA 139 08/22/2012   K 3.3* 08/22/2012  CL 102 08/22/2012   CREATININE 1.60* 08/22/2012   BUN 39* 08/22/2012   CO2 24 08/22/2012   TSH 1.32 09/01/2010   INR 0.9 09/01/2010   HGBA1C 7.4* 07/04/2012   MICROALBUR 0.8 08/16/2011       Assessment & Plan:

## 2012-09-29 ENCOUNTER — Ambulatory Visit: Payer: MEDICARE | Admitting: Internal Medicine

## 2012-10-21 ENCOUNTER — Ambulatory Visit: Payer: MEDICARE | Admitting: Endocrinology

## 2012-10-24 ENCOUNTER — Encounter: Payer: Self-pay | Admitting: Internal Medicine

## 2012-10-24 ENCOUNTER — Ambulatory Visit (INDEPENDENT_AMBULATORY_CARE_PROVIDER_SITE_OTHER): Payer: MEDICARE | Admitting: Internal Medicine

## 2012-10-24 VITALS — BP 100/62 | HR 71 | Temp 98.4°F | Wt 106.0 lb

## 2012-10-24 DIAGNOSIS — F29 Unspecified psychosis not due to a substance or known physiological condition: Secondary | ICD-10-CM

## 2012-10-24 DIAGNOSIS — R51 Headache: Secondary | ICD-10-CM

## 2012-10-24 DIAGNOSIS — R42 Dizziness and giddiness: Secondary | ICD-10-CM

## 2012-10-24 DIAGNOSIS — G43819 Other migraine, intractable, without status migrainosus: Secondary | ICD-10-CM

## 2012-10-24 DIAGNOSIS — F1120 Opioid dependence, uncomplicated: Secondary | ICD-10-CM

## 2012-10-24 MED ORDER — OXYCODONE HCL 20 MG PO TB12: 20.0000 mg | ORAL_TABLET | Freq: Two times a day (BID) | ORAL | Status: DC

## 2012-10-24 MED ORDER — OXYCODONE HCL 15 MG PO TABS: 15.0000 mg | ORAL_TABLET | Freq: Three times a day (TID) | ORAL | Status: DC | PRN

## 2012-10-24 NOTE — Assessment & Plan Note (Signed)
Continue with current prescription therapy as reflected on the Med list.  

## 2012-10-24 NOTE — Assessment & Plan Note (Signed)
Discussed.
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
## 2012-10-24 NOTE — Assessment & Plan Note (Signed)
Resolved

## 2012-11-03 ENCOUNTER — Other Ambulatory Visit: Payer: Self-pay | Admitting: Internal Medicine

## 2012-11-05 ENCOUNTER — Encounter: Payer: Self-pay | Admitting: Endocrinology

## 2012-11-05 ENCOUNTER — Ambulatory Visit (INDEPENDENT_AMBULATORY_CARE_PROVIDER_SITE_OTHER): Payer: MEDICARE | Admitting: Endocrinology

## 2012-11-05 VITALS — BP 114/68 | HR 65 | Ht 63.0 in | Wt 105.0 lb

## 2012-11-05 DIAGNOSIS — E109 Type 1 diabetes mellitus without complications: Secondary | ICD-10-CM

## 2012-11-05 LAB — HEMOGLOBIN A1C: Hgb A1c MFr Bld: 7.1 % — ABNORMAL HIGH (ref 4.6–6.5)

## 2012-11-05 NOTE — Progress Notes (Signed)
Subjective:    Patient ID: Lindsey Gomez, female    DOB: 26-Jun-1945, 67 y.o.   MRN: [id number]  HPI Pt has h/o insulin-requiring DM (dx'ed 1990, after a 40% pancreatectomy at [facility], for pancreatitis; she has mild if any neuropathy of the lower extremities; no known associated complications; she had an episode of severe hypoglycemia in September, 2013; she has never had DKA). she brings a record of his cbg's which i have reviewed today.  It varies from 53-300, but most are in the 100's.  It is in general highest in the afternoon and in am.  It is lowest at lunch and at hs.  However, she can have cbg as low as 100 at any time of day.  Since last ov, she has had only 2 episodes of hypoglycemia, and these were mild.  pt states she feels well in general, except for fatigue.  Past Medical History  Diagnosis Date  . Depression   . Type II or unspecified type diabetes mellitus without mention of complication, not stated as uncontrolled   . HTN (hypertension)   . Chronic pain   . Fibromyalgia   . Migraines   . History of breast cancer     Right  . Chronic pancreatitis   . Vitamin D deficiency   . GERD (gastroesophageal reflux disease)   . Finger dislocation 2009    L 3rd  . COPD (chronic obstructive pulmonary disease)   . Anemia   . Fractured sternum 11/2008  . Osteoporosis     Reclast too expensive  . Barrett's esophagus   . Chronic fatigue   . Diverticulosis   . Opioid dependence     Past Surgical History  Procedure Laterality Date  . Nasal sinus surgery    . Pancreatectomy      40%  . Cholecystectomy    . Appendectomy    . Abdominal hysterectomy      History   Social History  . Marital Status: Married    Spouse Name: [name]    Number of Children: N/A  . Years of Education: N/A   Occupational History  . 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 Smoking status: Current Every Day Smoker -- 2.00 packs/day  . Smokeless tobacco: Never Used  . Alcohol  Use: No  . Drug Use: No  . Sexual Activity: No   Other Topics Concern  . Not on file   Social History Narrative   Did not go back to work Oct 23, [name] is very ill - unable to have financial ends meet, no income      Regular Exercise -  NO      Coffee daily     Current Outpatient Prescriptions on File Prior to Visit  Medication Sig Dispense Refill  . ACCU-CHEK AVIVA PLUS test strip CHECK BLOOD SUGAR 4 TIMES A DAY.  100 each  6  . ACCU-CHEK SOFTCLIX LANCETS lancets Use as instructed  100 each  3  . amitriptyline (ELAVIL) 50 MG tablet Take 0.5-1 tablets (25-50 mg total) by mouth at bedtime.  60 tablet  5  . amLODipine-benazepril (LOTREL) 5-10 MG per capsule TAKE (1) CAPSULE DAILY.  30 capsule  5  . atenolol (TENORMIN) 100 MG tablet Take 1 tablet (100 mg total) by mouth every morning.  30 tablet  11  . atorvastatin (LIPITOR) 10 MG tablet Take 1 tablet (10 mg total) by mouth daily.  30 tablet  11  . calcitRIOL (ROCALTROL) 0.5 MCG capsule Take 2 capsules (1 mcg total) by mouth daily.  60 capsule  11  . diclofenac sodium (VOLTAREN) 1 % GEL Apply topically 4 (four) times daily as needed.        . eletriptan (RELPAX) 40 MG tablet One tablet by mouth at onset of headache. May repeat in 2 hours if headache persists or recurs. may repeat in 2 hours if necessary  8 tablet  11  . escitalopram (LEXAPRO) 20 MG tablet Take 1 tablet (20 mg total) by mouth daily.  30 tablet  5  . glucagon 1 MG injection Inject 1 mg into the vein once as needed.  1 each  12  . hydrocortisone (CORTEF) 10 MG tablet Take 1 tablet (10 mg total) by mouth 2 (two) times daily. Morning and at lunch  60 tablet  11  . insulin detemir (LEVEMIR) 100 UNIT/ML injection Inject 8 Units into the skin at bedtime.       . insulin glulisine (APIDRA) 100 UNIT/ML injection Inject subcutaneously 11 units with breakfast 5 units with lunch and 6 units with evening meal  9 mL  0  . Insulin Pen Needle (B-D ULTRAFINE III SHORT PEN) 31G X 8 MM MISC  Use as directed 5 times daily  200 each  11  . Ketorolac Tromethamine (SPRIX) 15.75 MG/SPRAY SOLN 1 spray by Nasal route 3 (three) times daily as needed.        [name] LORazepam (ATIVAN) 2 MG tablet Take 1 tablet (2 mg total) by mouth every 8 (eight) hours as needed for anxiety.  90 tablet  2  . mometasone (NASONEX) 50 MCG/ACT nasal spray Place 2 sprays into the nose daily.  17 g  11  . omeprazole (PRILOSEC) 40 MG capsule Take 1 capsule (40 mg total) by mouth 2 (two) times daily.  180 capsule  1  . oxyCODONE (OXYCONTIN) 20 MG 12 hr tablet Take 1 tablet (20 mg total) by mouth every 12 (twelve) hours.  60 tablet  0  . oxyCODONE (ROXICODONE) 15 MG immediate release tablet Take 1 tablet (15 mg total) by mouth every 8 (eight) hours as needed for pain.  90 tablet  0  . prochlorperazine (COMPAZINE) 10 MG tablet Take 1 tablet (10 mg total) by mouth every 8 (eight) hours as needed.  180 tablet  1  . ranitidine (ZANTAC) 300 MG tablet Take 1 tablet (300 mg total) by mouth at bedtime.  30 tablet  11  . tamoxifen (NOLVADEX) 20 MG tablet Take 20 mg by mouth 2 (two) times daily.        [name] topiramate (TOPAMAX) 200 MG tablet Take 1 tablet (200 mg total) by mouth 2 (two) times daily.  60 tablet  5  . triamcinolone (KENALOG) 0.1 % ointment Apply topically 2 (two) times daily as needed.        . zolmitriptan (ZOMIG) 5 MG tablet TAKE 1 TABLET BY MOUTH AS NEEDED MIGRAINE  8 tablet  1   Current Facility-Administered Medications on File Prior to Visit  Medication Dose Route Frequency Provider Last Rate Last Dose  . 0.9 %  sodium chloride infusion  500 mL Intravenous Continuous Louis Meckel, MD      . methylPREDNISolone acetate (DEPO-MEDROL) injection 40 mg  40 mg Intra-articular Once [name], MD        Allergies  Allergen Reactions  . Gabapentin     Weak muscles  . Milnacipran  REACTION: HA and hand swelling  . Moxifloxacin   . Rizatriptan Benzoate     REACTION: n/v  . Sulfonamide Derivatives   .  Venlafaxine     Family History  Problem Relation Age of Onset  . Colon cancer Neg Hx   . COPD Father   . Heart disease Father   . Leukemia Mother   . Diabetes Maternal Uncle     BP 114/68  Pulse 65  Ht 5\' 3"  (1.6 m)  Wt 105 lb (47.628 kg)  BMI 18.6 kg/m2  SpO2 95%  Review of Systems Denies weight change and LOC    Objective:   Physical Exam VITAL SIGNS:  See vs page GENERAL: no distress SKIN:  Insulin injection sites at the anterior thighs are normal.    Lab Results  Component Value Date   HGBA1C 7.1* 11/05/2012      Assessment & Plan:  DM: control continues to improve.  This insulin regimen was chosen from multiple options, as it best matches her insulin to her changing requirements throughout the day.  The benefits of glycemic control must be weighed against the risks of hypoglycemia.   Depression: this complicates the rx of DM HTN; well-controlled.  This control mitigates the complications of DM.

## 2012-11-05 NOTE — Patient Instructions (Addendum)
Please continue apidra, 11 units with breakfast 5 units with lunch and 6 units with evening meal, and: continue levemir, 8 units at bedtime.   If you vomit a meal, try to at least take some glucose tablets, to prevent low-blood sugar. check your blood sugar 4 times a day--before the 3 meals, and at bedtime.  also check if you have symptoms of your blood sugar being too high or too low.  please keep a record of the readings and bring it to your next appointment here.  please call us sooner if your blood sugar goes below 70, or if you have a lot of readings over 200.   Please come back for a follow-up appointment in 3 months.

## 2012-11-10 ENCOUNTER — Other Ambulatory Visit: Payer: Self-pay | Admitting: Internal Medicine

## 2012-11-11 ENCOUNTER — Other Ambulatory Visit: Payer: Self-pay | Admitting: Internal Medicine

## 2012-11-11 ENCOUNTER — Telehealth: Payer: Self-pay | Admitting: *Deleted

## 2012-11-11 NOTE — Telephone Encounter (Signed)
Refill done.  

## 2012-11-11 NOTE — Telephone Encounter (Signed)
Prior auth for Relpax 40mg  has been apprived through 8.15.2015.

## 2012-11-21 ENCOUNTER — Ambulatory Visit (INDEPENDENT_AMBULATORY_CARE_PROVIDER_SITE_OTHER): Payer: MEDICARE | Admitting: Internal Medicine

## 2012-11-21 ENCOUNTER — Encounter: Payer: Self-pay | Admitting: Internal Medicine

## 2012-11-21 VITALS — BP 102/68 | HR 72 | Temp 98.6°F | Resp 12 | Wt 106.0 lb

## 2012-11-21 DIAGNOSIS — F17200 Nicotine dependence, unspecified, uncomplicated: Secondary | ICD-10-CM

## 2012-11-21 DIAGNOSIS — Z23 Encounter for immunization: Secondary | ICD-10-CM

## 2012-11-21 DIAGNOSIS — F1120 Opioid dependence, uncomplicated: Secondary | ICD-10-CM

## 2012-11-21 DIAGNOSIS — F29 Unspecified psychosis not due to a substance or known physiological condition: Secondary | ICD-10-CM

## 2012-11-21 DIAGNOSIS — G43109 Migraine with aura, not intractable, without status migrainosus: Secondary | ICD-10-CM

## 2012-11-21 DIAGNOSIS — G43909 Migraine, unspecified, not intractable, without status migrainosus: Secondary | ICD-10-CM

## 2012-11-21 DIAGNOSIS — E109 Type 1 diabetes mellitus without complications: Secondary | ICD-10-CM

## 2012-11-21 DIAGNOSIS — IMO0001 Reserved for inherently not codable concepts without codable children: Secondary | ICD-10-CM

## 2012-11-21 MED ORDER — OMEPRAZOLE 40 MG PO CPDR: 40.0000 mg | DELAYED_RELEASE_CAPSULE | Freq: Two times a day (BID) | ORAL | Status: DC

## 2012-11-21 MED ORDER — OXYCODONE HCL 20 MG PO TB12: 20.0000 mg | ORAL_TABLET | Freq: Two times a day (BID) | ORAL | Status: DC

## 2012-11-21 MED ORDER — OXYCODONE HCL 15 MG PO TABS: 15.0000 mg | ORAL_TABLET | Freq: Three times a day (TID) | ORAL | Status: DC | PRN

## 2012-11-21 MED ORDER — ELETRIPTAN HYDROBROMIDE 40 MG PO TABS: 40.0000 mg | ORAL_TABLET | ORAL | Status: DC | PRN

## 2012-11-21 MED ORDER — AMITRIPTYLINE HCL 50 MG PO TABS: 25.0000 mg | ORAL_TABLET | Freq: Every day | ORAL | Status: DC

## 2012-11-21 NOTE — Assessment & Plan Note (Signed)
Continue with current prescription therapy as reflected on the Med list.  

## 2012-11-21 NOTE — Assessment & Plan Note (Signed)
No relapse 

## 2012-11-21 NOTE — Assessment & Plan Note (Signed)
Discussed.

## 2012-11-21 NOTE — Assessment & Plan Note (Addendum)
Continue with current opioid prescription therapy as reflected on the Med list. Discussed

## 2012-11-21 NOTE — Progress Notes (Signed)
Subjective:  Lindsey Gomez fell yesterday in the tall grass and this am: c/o LBP  Migraine  This is a chronic problem. The current episode started more than 1 year ago. The problem occurs daily. The problem has been unchanged. The pain is located in the bilateral region. The pain radiates to the right neck and left neck. The pain quality is similar to prior headaches. The quality of the pain is described as shooting. The pain is at a severity of 7/10. The pain is severe. Associated symptoms include muscle aches, nausea and vomiting. The symptoms are aggravated by fatigue. Her past medical history is significant for cancer and migraine headaches.   Not better. Gabapentin caused side effects - Mariana stopped it a while ago... [place] denies OSA [name] severe HA's - migraines. She was vomiting this am w/migraine. [name] on severe pain - neck, back.  The patient presents for a follow-up of chronic HAs, depression, anxiety, hypertension, chronic dyslipidemia, type 1 diabetes.  Problems with [name] at home; worse stress. C/o more anxiety; depressed - [facility] is helping at home    Wt Readings from Last 3 Encounters:  11/21/12 106 lb (48.081 kg)  11/05/12 105 lb (47.628 kg)  10/24/12 106 lb (48.081 kg)   BP Readings from Last 3 Encounters:  11/21/12 102/68  11/05/12 114/68  10/24/12 100/62      Review of Systems  Constitutional: Positive for fatigue. Negative for chills, activity change, appetite change and unexpected weight change.  HENT: Negative for congestion and mouth sores.   Eyes: Negative for visual disturbance.  Respiratory: Negative for chest tightness.   Gastrointestinal: Positive for nausea and vomiting.  Genitourinary: Negative for frequency, difficulty urinating and vaginal pain.  Musculoskeletal: Positive for arthralgias and gait problem.  Skin: Negative for pallor and rash.  Neurological: Negative for tremors.  Psychiatric/Behavioral: Negative for suicidal ideas, confusion, sleep  disturbance and self-injury. The patient is nervous/anxious.        Objective:   Physical Exam  Constitutional: She appears well-developed and well-nourished. No distress.  HENT:  Head: Normocephalic.  Right Ear: External ear normal.  Left Ear: External ear normal.  Nose: Nose normal.  Mouth/Throat: Oropharynx is clear and moist.  Eyes: Conjunctivae are normal. Pupils are equal, round, and reactive to light. Right eye exhibits no discharge. Left eye exhibits no discharge.  Neck: Normal range of motion. Neck supple. No JVD present. No tracheal deviation present. No thyromegaly present.  Cardiovascular: Normal rate, regular rhythm and normal heart sounds.   Pulmonary/Chest: No stridor. No respiratory distress. She has no wheezes.  Abdominal: Soft. Bowel sounds are normal. She exhibits no distension and no mass. There is no tenderness. There is no rebound and no guarding.  Musculoskeletal: She exhibits no edema and no tenderness.  Lymphadenopathy:    She has no cervical adenopathy.  Neurological: She displays normal reflexes. No cranial nerve deficit. She exhibits normal muscle tone. Coordination normal.  Skin: Skin is warm. No rash noted. No erythema.  Psychiatric: Her behavior is normal. Judgment and thought content normal.  Not depressed, not tearful - looks better     Lab Results  Component Value Date   WBC 9.6 08/22/2012   HGB 10.6* 08/22/2012   HCT 31.3* 08/22/2012   PLT 148* 08/22/2012   GLUCOSE 157* 08/22/2012   CHOL 226* 06/15/2010   TRIG 213.0* 06/15/2010   HDL 62.60 06/15/2010   LDLDIRECT 131.9 06/15/2010   LDLCALC 99 05/20/2006   ALT 13 08/22/2012   AST 15 08/22/2012  NA 139 08/22/2012   K 3.3* 08/22/2012   CL 102 08/22/2012   CREATININE 1.60* 08/22/2012   BUN 39* 08/22/2012   CO2 24 08/22/2012   TSH 1.32 09/01/2010   INR 0.9 09/01/2010   HGBA1C 7.1* 11/05/2012   MICROALBUR 0.8 08/16/2011       Assessment & Plan:

## 2012-11-23 ENCOUNTER — Other Ambulatory Visit: Payer: Self-pay | Admitting: Internal Medicine

## 2012-12-15 ENCOUNTER — Encounter: Payer: Self-pay | Admitting: Endocrinology

## 2012-12-15 ENCOUNTER — Ambulatory Visit (INDEPENDENT_AMBULATORY_CARE_PROVIDER_SITE_OTHER): Payer: MEDICARE | Admitting: Endocrinology

## 2012-12-15 VITALS — BP 122/80 | HR 74 | Wt 105.0 lb

## 2012-12-15 DIAGNOSIS — E109 Type 1 diabetes mellitus without complications: Secondary | ICD-10-CM

## 2012-12-15 MED ORDER — INSULIN ASPART 100 UNIT/ML FLEXPEN: PEN_INJECTOR | SUBCUTANEOUS | Status: DC

## 2012-12-15 NOTE — Patient Instructions (Addendum)
Please increase levemir to 9 units at bedtime.   If you vomit a meal, try to at least take some glucose tablets, to prevent low-blood sugar.  check your blood sugar 4 times a day--before the 3 meals, and at bedtime.  also check if you have symptoms of your blood sugar being too high or too low.  please keep a record of the readings and bring it to your next appointment here.  please call us sooner if your blood sugar goes below 70, or if you have a lot of readings over 200.   Please come back for a follow-up appointment in 3 months.  i have sent a prescription to your pharmacy, to change the apidra to novolog, at the same amount.

## 2012-12-15 NOTE — Progress Notes (Signed)
Subjective:    Patient ID: Lindsey Gomez, female    DOB: 10-Aug-1945, 67 y.o.   MRN: [id number]  HPI Pt has h/o insulin-requiring DM (dx'ed 1990, after a 40% pancreatectomy at [facility], for pancreatitis; she has mild if any neuropathy of the lower extremities; no known associated complications; she had an episode of severe hypoglycemia in September, 2013; she has never had DKA).  she brings a record of her cbg's which i have reviewed today.  It was as low as 50, once in the afternoon.  It is highest (200) in am.  pt states she feels well in general. Past Medical History  Diagnosis Date  . Depression   . Type II or unspecified type diabetes mellitus without mention of complication, not stated as uncontrolled   . HTN (hypertension)   . Chronic pain   . Fibromyalgia   . Migraines   . History of breast cancer     Right  . Chronic pancreatitis   . Vitamin D deficiency   . GERD (gastroesophageal reflux disease)   . Finger dislocation 2009    L 3rd  . COPD (chronic obstructive pulmonary disease)   . Anemia   . Fractured sternum 11/2008  . Osteoporosis     Reclast too expensive  . Barrett's esophagus   . Chronic fatigue   . Diverticulosis   . Opioid dependence     Past Surgical History  Procedure Laterality Date  . Nasal sinus surgery    . Pancreatectomy      40%  . Cholecystectomy    . Appendectomy    . Abdominal hysterectomy      History   Social History  . Marital Status: Married    Spouse Name: [name]    Number of Children: N/A  . Years of Education: N/A   Occupational History  . 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 Smoking status: Current Every Day Smoker -- 2.00 packs/day  . Smokeless tobacco: Never Used  . Alcohol Use: No  . Drug Use: No  . Sexual Activity: No   Other Topics Concern  . Not on file   Social History Narrative   Did not go back to work Oct 23, [name] is very ill - unable to have financial ends meet, no income      Regular Exercise -  NO      Coffee daily     Current Outpatient Prescriptions on File Prior to Visit  Medication Sig Dispense Refill  . ACCU-CHEK AVIVA PLUS test strip CHECK BLOOD SUGAR 4 TIMES A DAY.  100 each  6  . ACCU-CHEK SOFTCLIX LANCETS lancets Use as instructed  100 each  3  . amitriptyline (ELAVIL) 50 MG tablet Take 0.5-1 tablets (25-50 mg total) by mouth at bedtime.  60 tablet  11  . amLODipine-benazepril (LOTREL) 5-10 MG per capsule TAKE (1) CAPSULE DAILY.  30 capsule  5  . atenolol (TENORMIN) 100 MG tablet Take 1 tablet (100 mg total) by mouth every morning.  30 tablet  11  . atorvastatin (LIPITOR) 10 MG tablet Take 1 tablet (10 mg total) by mouth daily.  30 tablet  11  . B-D ULTRAFINE III SHORT PEN 31G X 8 MM MISC USE 5 TIMES A DAY AS DIRECTED  200 each  3  . calcitRIOL (ROCALTROL) 0.5 MCG capsule Take 2 capsules (1 mcg total) by mouth daily.  60 capsule  11  . diclofenac  sodium (VOLTAREN) 1 % GEL Apply topically 4 (four) times daily as needed.        . eletriptan (RELPAX) 40 MG tablet Take 1 tablet (40 mg total) by mouth as needed for migraine. may repeat in 2 hours if necessary  8 tablet  11  . escitalopram (LEXAPRO) 20 MG tablet Take 1 tablet (20 mg total) by mouth daily.  30 tablet  5  . glucagon 1 MG injection Inject 1 mg into the vein once as needed.  1 each  12  . hydrocortisone (CORTEF) 10 MG tablet Take 1 tablet (10 mg total) by mouth 2 (two) times daily. Morning and at lunch  60 tablet  11  . insulin detemir (LEVEMIR) 100 UNIT/ML injection Inject 9 Units into the skin at bedtime.       [name] Ketorolac Tromethamine (SPRIX) 15.75 MG/SPRAY SOLN 1 spray by Nasal route 3 (three) times daily as needed.        [name] LORazepam (ATIVAN) 2 MG tablet TAKE 1 TABLET BY MOUTH EVERY 8 HOURS AS NEEDED FOR ANXIETY  90 tablet  2  . mometasone (NASONEX) 50 MCG/ACT nasal spray Place 2 sprays into the nose daily.  17 g  11  . omeprazole (PRILOSEC) 40 MG capsule Take 1 capsule (40 mg total) by mouth  2 (two) times daily.  180 capsule  1  . oxyCODONE (OXYCONTIN) 20 MG 12 hr tablet Take 1 tablet (20 mg total) by mouth every 12 (twelve) hours.  60 tablet  0  . oxyCODONE (ROXICODONE) 15 MG immediate release tablet Take 1 tablet (15 mg total) by mouth every 8 (eight) hours as needed for pain.  90 tablet  0  . prochlorperazine (COMPAZINE) 10 MG tablet Take 1 tablet (10 mg total) by mouth every 8 (eight) hours as needed.  180 tablet  1  . ranitidine (ZANTAC) 300 MG tablet Take 1 tablet (300 mg total) by mouth at bedtime.  30 tablet  11  . tamoxifen (NOLVADEX) 20 MG tablet Take 20 mg by mouth 2 (two) times daily.        [name] topiramate (TOPAMAX) 200 MG tablet Take 1 tablet (200 mg total) by mouth 2 (two) times daily.  60 tablet  5  . triamcinolone (KENALOG) 0.1 % ointment Apply topically 2 (two) times daily as needed.        . zolmitriptan (ZOMIG) 5 MG tablet TAKE 1 TABLET BY MOUTH AS NEEDED MIGRAINE  8 tablet  1   Current Facility-Administered Medications on File Prior to Visit  Medication Dose Route Frequency Provider Last Rate Last Dose  . 0.9 %  sodium chloride infusion  500 mL Intravenous Continuous Louis Meckel, MD        Allergies  Allergen Reactions  . Gabapentin     Weak muscles  . Milnacipran     REACTION: HA and hand swelling  . Moxifloxacin   . Rizatriptan Benzoate     REACTION: n/v  . Sulfonamide Derivatives   . Venlafaxine     Family History  Problem Relation Age of Onset  . Colon cancer Neg Hx   . COPD Father   . Heart disease Father   . Leukemia Mother   . Diabetes Maternal Uncle     BP 122/80  Pulse 74  Wt 105 lb (47.628 kg)  BMI 18.6 kg/m2  SpO2 96%  Review of Systems Denies LOC.  She has lost a few lbs.     Objective:   Physical Exam  VITAL SIGNS:  See vs page. GENERAL: no distress.  Lab Results  Component Value Date   HGBA1C 7.1* 11/05/2012      Assessment & Plan:  DM: control continues to improve.  This insulin regimen was chosen from multiple  options, as it best matches her insulin to her changing requirements throughout the day.  The benefits of glycemic control must be weighed against the risks of hypoglycemia.  Pancreatic insufficiency: in this context, she needs only a low dosage of insulin.

## 2012-12-18 ENCOUNTER — Telehealth: Payer: Self-pay | Admitting: Endocrinology

## 2012-12-18 NOTE — Telephone Encounter (Signed)
These numbers are pretty good.  Please call if < 70

## 2012-12-18 NOTE — Telephone Encounter (Signed)
Pt advised.

## 2012-12-18 NOTE — Telephone Encounter (Signed)
Pt states her cbg was 145 this am fasting, checked 1 hr ago 84 and now has a bad headache

## 2012-12-22 ENCOUNTER — Ambulatory Visit (INDEPENDENT_AMBULATORY_CARE_PROVIDER_SITE_OTHER): Payer: MEDICARE | Admitting: Internal Medicine

## 2012-12-22 ENCOUNTER — Encounter: Payer: Self-pay | Admitting: Internal Medicine

## 2012-12-22 VITALS — BP 90/62 | HR 76 | Temp 97.9°F | Resp 16 | Wt 105.0 lb

## 2012-12-22 DIAGNOSIS — G43109 Migraine with aura, not intractable, without status migrainosus: Secondary | ICD-10-CM

## 2012-12-22 DIAGNOSIS — F411 Generalized anxiety disorder: Secondary | ICD-10-CM

## 2012-12-22 DIAGNOSIS — IMO0001 Reserved for inherently not codable concepts without codable children: Secondary | ICD-10-CM

## 2012-12-22 DIAGNOSIS — E109 Type 1 diabetes mellitus without complications: Secondary | ICD-10-CM

## 2012-12-22 DIAGNOSIS — F29 Unspecified psychosis not due to a substance or known physiological condition: Secondary | ICD-10-CM

## 2012-12-22 DIAGNOSIS — Z2911 Encounter for prophylactic immunotherapy for respiratory syncytial virus (RSV): Secondary | ICD-10-CM

## 2012-12-22 DIAGNOSIS — F329 Major depressive disorder, single episode, unspecified: Secondary | ICD-10-CM

## 2012-12-22 DIAGNOSIS — Z23 Encounter for immunization: Secondary | ICD-10-CM

## 2012-12-22 DIAGNOSIS — G43909 Migraine, unspecified, not intractable, without status migrainosus: Secondary | ICD-10-CM

## 2012-12-22 MED ORDER — ESCITALOPRAM OXALATE 20 MG PO TABS: 20.0000 mg | ORAL_TABLET | Freq: Every day | ORAL | Status: DC

## 2012-12-22 MED ORDER — AMLODIPINE BESY-BENAZEPRIL HCL 5-10 MG PO CAPS: ORAL_CAPSULE | ORAL | Status: DC

## 2012-12-22 MED ORDER — OXYCODONE HCL 15 MG PO TABS: 15.0000 mg | ORAL_TABLET | Freq: Three times a day (TID) | ORAL | Status: DC | PRN

## 2012-12-22 MED ORDER — TOPIRAMATE 200 MG PO TABS: 200.0000 mg | ORAL_TABLET | Freq: Two times a day (BID) | ORAL | Status: DC

## 2012-12-22 MED ORDER — AMITRIPTYLINE HCL 50 MG PO TABS: 25.0000 mg | ORAL_TABLET | Freq: Every day | ORAL | Status: DC

## 2012-12-22 MED ORDER — RANITIDINE HCL 300 MG PO TABS: 300.0000 mg | ORAL_TABLET | Freq: Every day | ORAL | Status: DC

## 2012-12-22 MED ORDER — OXYCODONE HCL ER 20 MG PO T12A: 20.0000 mg | EXTENDED_RELEASE_TABLET | Freq: Two times a day (BID) | ORAL | Status: DC

## 2012-12-22 MED ORDER — ZOLMITRIPTAN 5 MG NA SOLN: 1.0000 | NASAL | Status: DC | PRN

## 2012-12-22 MED ORDER — ZOLMITRIPTAN 5 MG PO TABS: ORAL_TABLET | ORAL | Status: DC

## 2012-12-22 MED ORDER — OMEPRAZOLE 40 MG PO CPDR: 40.0000 mg | DELAYED_RELEASE_CAPSULE | Freq: Two times a day (BID) | ORAL | Status: DC

## 2012-12-22 MED ORDER — ELETRIPTAN HYDROBROMIDE 40 MG PO TABS: 40.0000 mg | ORAL_TABLET | ORAL | Status: DC | PRN

## 2012-12-22 MED ORDER — LORAZEPAM 2 MG PO TABS: ORAL_TABLET | ORAL | Status: DC

## 2012-12-22 MED ORDER — PROCHLORPERAZINE MALEATE 10 MG PO TABS: 10.0000 mg | ORAL_TABLET | Freq: Three times a day (TID) | ORAL | Status: DC | PRN

## 2012-12-25 ENCOUNTER — Other Ambulatory Visit: Payer: Self-pay | Admitting: Internal Medicine

## 2012-12-29 ENCOUNTER — Other Ambulatory Visit: Payer: Self-pay | Admitting: Internal Medicine

## 2012-12-31 ENCOUNTER — Encounter: Payer: Self-pay | Admitting: Internal Medicine

## 2012-12-31 NOTE — Assessment & Plan Note (Signed)
Continue with current prescription therapy as reflected on the Med list.  

## 2012-12-31 NOTE — Progress Notes (Signed)
Subjective:    Migraine  This is a chronic problem. The current episode started more than 1 year ago. The problem occurs daily. The problem has been unchanged. The pain is located in the bilateral region. The pain radiates to the right neck and left neck. The pain quality is similar to prior headaches. The quality of the pain is described as shooting. The pain is at a severity of 7/10. The pain is severe. Associated symptoms include muscle aches, nausea and vomiting. The symptoms are aggravated by fatigue. Her past medical history is significant for cancer and migraine headaches.   Not better. Gabapentin caused side effects - Lindsey Gomez stopped it a while ago... Lindsey Gomez denies OSA [name] severe HA's - migraines. She was vomiting this am w/migraine. [name] on severe pain - neck, back.  The patient presents for a follow-up of chronic HAs, depression, anxiety, hypertension, chronic dyslipidemia, type 1 diabetes.  Problems with [name] at home; worse stress. C/o more anxiety; depressed - [facility] is helping at home    Wt Readings from Last 3 Encounters:  12/22/12 105 lb (47.628 kg)  12/15/12 105 lb (47.628 kg)  11/21/12 106 lb (48.081 kg)   BP Readings from Last 3 Encounters:  12/22/12 90/62  12/15/12 122/80  11/21/12 102/68      Review of Systems  Constitutional: Positive for fatigue. Negative for chills, activity change, appetite change and unexpected weight change.  HENT: Negative for congestion and mouth sores.   Eyes: Negative for visual disturbance.  Respiratory: Negative for chest tightness.   Gastrointestinal: Positive for nausea and vomiting.  Genitourinary: Negative for frequency, difficulty urinating and vaginal pain.  Musculoskeletal: Positive for arthralgias and gait problem.  Skin: Negative for pallor and rash.  Neurological: Negative for tremors.  Psychiatric/Behavioral: Negative for suicidal ideas, confusion, sleep disturbance and self-injury. The patient is nervous/anxious.         Objective:   Physical Exam  Constitutional: She appears well-developed and well-nourished. No distress.  HENT:  Head: Normocephalic.  Right Ear: External ear normal.  Left Ear: External ear normal.  Nose: Nose normal.  Mouth/Throat: Oropharynx is clear and moist.  Eyes: Conjunctivae are normal. Pupils are equal, round, and reactive to light. Right eye exhibits no discharge. Left eye exhibits no discharge.  Neck: Normal range of motion. Neck supple. No JVD present. No tracheal deviation present. No thyromegaly present.  Cardiovascular: Normal rate, regular rhythm and normal heart sounds.   Pulmonary/Chest: No stridor. No respiratory distress. She has no wheezes.  Abdominal: Soft. Bowel sounds are normal. She exhibits no distension and no mass. There is no tenderness. There is no rebound and no guarding.  Musculoskeletal: She exhibits no edema and no tenderness.  Lymphadenopathy:    She has no cervical adenopathy.  Neurological: She displays normal reflexes. No cranial nerve deficit. She exhibits normal muscle tone. Coordination normal.  Skin: Skin is warm. No rash noted. No erythema.  Psychiatric: Her behavior is normal. Judgment and thought content normal.  Not depressed, not tearful - looks better     Lab Results  Component Value Date   WBC 9.6 08/22/2012   HGB 10.6* 08/22/2012   HCT 31.3* 08/22/2012   PLT 148* 08/22/2012   GLUCOSE 157* 08/22/2012   CHOL 226* 06/15/2010   TRIG 213.0* 06/15/2010   HDL 62.60 06/15/2010   LDLDIRECT 131.9 06/15/2010   LDLCALC 99 05/20/2006   ALT 13 08/22/2012   AST 15 08/22/2012   NA 139 08/22/2012   K 3.3* 08/22/2012  CL 102 08/22/2012   CREATININE 1.60* 08/22/2012   BUN 39* 08/22/2012   CO2 24 08/22/2012   TSH 1.32 09/01/2010   INR 0.9 09/01/2010   HGBA1C 7.1* 11/05/2012   MICROALBUR 0.8 08/16/2011       Assessment & Plan:

## 2012-12-31 NOTE — Assessment & Plan Note (Signed)
No relapse 

## 2013-01-05 ENCOUNTER — Other Ambulatory Visit: Payer: Self-pay | Admitting: Internal Medicine

## 2013-01-06 ENCOUNTER — Ambulatory Visit: Payer: MEDICARE | Admitting: Endocrinology

## 2013-01-11 ENCOUNTER — Other Ambulatory Visit: Payer: Self-pay | Admitting: Internal Medicine

## 2013-01-13 ENCOUNTER — Ambulatory Visit (INDEPENDENT_AMBULATORY_CARE_PROVIDER_SITE_OTHER): Payer: MEDICARE | Admitting: Endocrinology

## 2013-01-13 ENCOUNTER — Encounter: Payer: Self-pay | Admitting: Endocrinology

## 2013-01-13 VITALS — BP 118/70 | HR 69 | Wt 105.0 lb

## 2013-01-13 DIAGNOSIS — E109 Type 1 diabetes mellitus without complications: Secondary | ICD-10-CM

## 2013-01-13 MED ORDER — INSULIN ASPART 100 UNIT/ML FLEXPEN: PEN_INJECTOR | SUBCUTANEOUS | Status: DC

## 2013-01-13 NOTE — Progress Notes (Signed)
Subjective:    Patient ID: Lindsey Gomez, female    DOB: 13-Jan-1946, 67 y.o.   MRN: [id number]  HPI Pt has h/o insulin-requiring DM (dx'ed 1990, after a 40% pancreatectomy at [facility], for pancreatitis; she has mild if any neuropathy of the lower extremities; no known associated complications; she had an episode of severe hypoglycemia in September, 2013; she has never had DKA).  she brings a record of her cbg's which i have reviewed today.  It is mildly low approx qod.  This can happen any time except early am, but happens most commonly before lunch Past Medical History  Diagnosis Date  . Depression   . Type II or unspecified type diabetes mellitus without mention of complication, not stated as uncontrolled   . HTN (hypertension)   . Chronic pain   . Fibromyalgia   . Migraines   . History of breast cancer     Right  . Chronic pancreatitis   . Vitamin D deficiency   . GERD (gastroesophageal reflux disease)   . Finger dislocation 2009    L 3rd  . COPD (chronic obstructive pulmonary disease)   . Anemia   . Fractured sternum 11/2008  . Osteoporosis     Reclast too expensive  . Barrett's esophagus   . Chronic fatigue   . Diverticulosis   . Opioid dependence     Past Surgical History  Procedure Laterality Date  . Nasal sinus surgery    . Pancreatectomy      40%  . Cholecystectomy    . Appendectomy    . Abdominal hysterectomy      History   Social History  . Marital Status: Married    Spouse Name: [name]    Number of Children: N/A  . Years of Education: N/A   Occupational History  . 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 Smoking status: Current Every Day Smoker -- 2.00 packs/day  . Smokeless tobacco: Never Used  . Alcohol Use: No  . Drug Use: No  . Sexual Activity: No   Other Topics Concern  . Not on file   Social History Narrative   Did not go back to work Oct 23, [name] is very ill - unable to have financial ends meet, no income      Regular Exercise -  NO      Coffee daily     Current Outpatient Prescriptions on File Prior to Visit  Medication Sig Dispense Refill  . ACCU-CHEK AVIVA PLUS test strip USE TO CHECK BLOOD SUGAR LEVELS 4 TIMES DAILY AS DIRECTED  100 each  5  . ACCU-CHEK SOFTCLIX LANCETS lancets Use as instructed  100 each  3  . amitriptyline (ELAVIL) 50 MG tablet Take 0.5-1 tablets (25-50 mg total) by mouth at bedtime.  60 tablet  11  . amLODipine-benazepril (LOTREL) 5-10 MG per capsule TAKE (1) CAPSULE DAILY.  30 capsule  11  . atenolol (TENORMIN) 100 MG tablet Take 1 tablet (100 mg total) by mouth every morning.  30 tablet  11  . atorvastatin (LIPITOR) 10 MG tablet Take 1 tablet (10 mg total) by mouth daily.  30 tablet  11  . B-D ULTRAFINE III SHORT PEN 31G X 8 MM MISC USE 5 TIMES A DAY AS DIRECTED  200 each  3  . calcitRIOL (ROCALTROL) 0.5 MCG capsule Take 2 capsules (1 mcg total) by mouth daily.  60 capsule  11  . diclofenac  sodium (VOLTAREN) 1 % GEL Apply topically 4 (four) times daily as needed.        [name] escitalopram (LEXAPRO) 20 MG tablet Take 1 tablet (20 mg total) by mouth daily.  30 tablet  5  . glucagon 1 MG injection Inject 1 mg into the vein once as needed.  1 each  12  . hydrocortisone (CORTEF) 10 MG tablet TAKE 1 TABLET BY MOUTH TWICE DAILY IN THE MORNING AND AT LUNCH  60 tablet  2  . insulin detemir (LEVEMIR) 100 UNIT/ML injection Inject 9 Units into the skin at bedtime.       [name] Ketorolac Tromethamine (SPRIX) 15.75 MG/SPRAY SOLN 1 spray by Nasal route 3 (three) times daily as needed.        [name] LORazepam (ATIVAN) 2 MG tablet TAKE 1 TABLET BY MOUTH EVERY 8 HOURS AS NEEDED FOR ANXIETY  90 tablet  2  . mometasone (NASONEX) 50 MCG/ACT nasal spray Place 2 sprays into the nose daily.  17 g  11  . omeprazole (PRILOSEC) 40 MG capsule Take 1 capsule (40 mg total) by mouth 2 (two) times daily.  30 capsule  11  . OxyCODONE (OXYCONTIN) 20 mg T12A 12 hr tablet Take 1 tablet (20 mg total) by mouth every 12  (twelve) hours.  60 tablet  0  . oxyCODONE (ROXICODONE) 15 MG immediate release tablet Take 1 tablet (15 mg total) by mouth every 8 (eight) hours as needed for pain.  90 tablet  0  . prochlorperazine (COMPAZINE) 10 MG tablet Take 1 tablet (10 mg total) by mouth every 8 (eight) hours as needed.  90 tablet  1  . ranitidine (ZANTAC) 300 MG tablet Take 1 tablet (300 mg total) by mouth at bedtime.  30 tablet  11  . tamoxifen (NOLVADEX) 20 MG tablet Take 20 mg by mouth 2 (two) times daily.        [name] topiramate (TOPAMAX) 200 MG tablet Take 1 tablet (200 mg total) by mouth 2 (two) times daily.  60 tablet  5  . triamcinolone (KENALOG) 0.1 % ointment Apply topically 2 (two) times daily as needed.        . zolmitriptan (ZOMIG) 5 MG nasal solution Place 1 spray into the nose as needed for migraine.  6 Units  5  . zolmitriptan (ZOMIG) 5 MG tablet TAKE 1 TABLET BY MOUTH AS NEEDED MIGRAINE  8 tablet  5   Current Facility-Administered Medications on File Prior to Visit  Medication Dose Route Frequency Provider Last Rate Last Dose  . 0.9 %  sodium chloride infusion  500 mL Intravenous Continuous Louis Meckel, MD        Allergies  Allergen Reactions  . Gabapentin     Weak muscles  . Milnacipran     REACTION: HA and hand swelling  . Moxifloxacin   . Rizatriptan Benzoate     REACTION: n/v  . Sulfonamide Derivatives   . Venlafaxine     Family History  Problem Relation Age of Onset  . Colon cancer Neg Hx   . COPD Father   . Heart disease Father   . Leukemia Mother   . Diabetes Maternal Uncle     BP 118/70  Pulse 69  Wt 105 lb (47.628 kg)  SpO2 97%  Review of Systems Denies LOC.  She has lost a few lbs.    Objective:   Physical Exam VITAL SIGNS:  See vs page GENERAL: no distress. PSYCH: Alert and oriented x 3.  Does not appear anxious nor depressed.      Assessment & Plan:  DM: although a1c is over 7%, variable cbg's are limiting rx. Weight loss: this is probably reducing insulin  requirement. Pancreatic insufficiency: in this context, she needs only a low dosage of insulin.

## 2013-01-13 NOTE — Patient Instructions (Addendum)
Please reduce the novolog to 10 units with breakfast 5 units with lunch and 6 units with evening meal check your blood sugar 4 times a day--before the 3 meals, and at bedtime.  also check if you have symptoms of your blood sugar being too high or too low.  please keep a record of the readings and bring it to your next appointment here.  please call us sooner if your blood sugar goes below 70, or if you have a lot of readings over 200.   Please come back for a follow-up appointment in January.

## 2013-01-19 ENCOUNTER — Ambulatory Visit (INDEPENDENT_AMBULATORY_CARE_PROVIDER_SITE_OTHER): Payer: MEDICARE | Admitting: Internal Medicine

## 2013-01-19 ENCOUNTER — Encounter: Payer: Self-pay | Admitting: Internal Medicine

## 2013-01-19 ENCOUNTER — Telehealth: Payer: Self-pay | Admitting: Internal Medicine

## 2013-01-19 VITALS — BP 130/78 | HR 68 | Temp 97.9°F | Resp 16 | Wt 104.0 lb

## 2013-01-19 DIAGNOSIS — IMO0001 Reserved for inherently not codable concepts without codable children: Secondary | ICD-10-CM

## 2013-01-19 DIAGNOSIS — E559 Vitamin D deficiency, unspecified: Secondary | ICD-10-CM

## 2013-01-19 DIAGNOSIS — E109 Type 1 diabetes mellitus without complications: Secondary | ICD-10-CM

## 2013-01-19 DIAGNOSIS — R51 Headache: Secondary | ICD-10-CM

## 2013-01-19 DIAGNOSIS — F329 Major depressive disorder, single episode, unspecified: Secondary | ICD-10-CM

## 2013-01-19 DIAGNOSIS — F1120 Opioid dependence, uncomplicated: Secondary | ICD-10-CM

## 2013-01-19 DIAGNOSIS — G43109 Migraine with aura, not intractable, without status migrainosus: Secondary | ICD-10-CM

## 2013-01-19 DIAGNOSIS — G43909 Migraine, unspecified, not intractable, without status migrainosus: Secondary | ICD-10-CM

## 2013-01-19 DIAGNOSIS — F411 Generalized anxiety disorder: Secondary | ICD-10-CM

## 2013-01-19 MED ORDER — OXYCODONE HCL 15 MG PO TABS: 15.0000 mg | ORAL_TABLET | Freq: Three times a day (TID) | ORAL | Status: DC | PRN

## 2013-01-19 MED ORDER — OXYCODONE HCL ER 20 MG PO T12A: 20.0000 mg | EXTENDED_RELEASE_TABLET | Freq: Two times a day (BID) | ORAL | Status: DC

## 2013-01-19 NOTE — Assessment & Plan Note (Signed)
Continue with current prescription therapy as reflected on the Med list.  

## 2013-01-19 NOTE — Assessment & Plan Note (Signed)
Lindsey Gomez may not be taking Topamax - re-start

## 2013-01-19 NOTE — Telephone Encounter (Signed)
Pt wants to know if antibiotics will be called in for a sinus infection.  She is having cataract surgery on Dec 1.

## 2013-01-19 NOTE — Progress Notes (Signed)
Subjective:   "You need to double my pain medicines - I am having HA all the time!"  Migraine  This is a chronic problem. The current episode started more than 1 year ago. The problem occurs daily. The problem has been unchanged. The pain is located in the bilateral region. The pain radiates to the right neck and left neck. The pain quality is similar to prior headaches. The quality of the pain is described as shooting. The pain is at a severity of 7/10. The pain is severe. Associated symptoms include muscle aches, nausea and vomiting. The symptoms are aggravated by fatigue. Her past medical history is significant for cancer and migraine headaches.   Not better. Gabapentin caused side effects - Evaluna stopped it a while ago... Odis denies OSA [name] severe HA's - migraines. She was vomiting this am w/migraine. [name] on severe pain - neck, back.  The patient presents for a follow-up of chronic HAs, depression, anxiety, hypertension, chronic dyslipidemia, type 1 diabetes.  Problems with [name] at home; stress. C/o more anxiety; depressed - [facility] is helping at home. June has a housekeeper now    [employer] from Last 3 Encounters:  01/19/13 104 lb (47.174 kg)  01/13/13 105 lb (47.628 kg)  12/22/12 105 lb (47.628 kg)   BP Readings from Last 3 Encounters:  01/19/13 130/78  01/13/13 118/70  12/22/12 90/62      Review of Systems  Constitutional: Positive for fatigue. Negative for chills, activity change, appetite change and unexpected weight change.  HENT: Negative for congestion and mouth sores.   Eyes: Negative for visual disturbance.  Respiratory: Negative for chest tightness.   Gastrointestinal: Positive for nausea and vomiting.  Genitourinary: Negative for frequency, difficulty urinating and vaginal pain.  Musculoskeletal: Positive for arthralgias and gait problem.  Skin: Negative for pallor and rash.  Neurological: Negative for tremors.  Psychiatric/Behavioral: Negative for  suicidal ideas, confusion, sleep disturbance and self-injury. The patient is nervous/anxious.        Objective:   Physical Exam  Constitutional: She appears well-developed and well-nourished. No distress.  HENT:  Head: Normocephalic.  Right Ear: External ear normal.  Left Ear: External ear normal.  Nose: Nose normal.  Mouth/Throat: Oropharynx is clear and moist.  Eyes: Conjunctivae are normal. Pupils are equal, round, and reactive to light. Right eye exhibits no discharge. Left eye exhibits no discharge.  Neck: Normal range of motion. Neck supple. No JVD present. No tracheal deviation present. No thyromegaly present.  Cardiovascular: Normal rate, regular rhythm and normal heart sounds.   Pulmonary/Chest: No stridor. No respiratory distress. She has no wheezes.  Abdominal: Soft. Bowel sounds are normal. She exhibits no distension and no mass. There is no tenderness. There is no rebound and no guarding.  Musculoskeletal: She exhibits no edema and no tenderness.  Lymphadenopathy:    She has no cervical adenopathy.  Neurological: She displays normal reflexes. No cranial nerve deficit. She exhibits normal muscle tone. Coordination normal.  Skin: Skin is warm. No rash noted. No erythema.  Psychiatric: Her behavior is normal. Judgment and thought content normal.  Not depressed, not tearful - looks better  Deon is upset w/me   Lab Results  Component Value Date   WBC 9.6 08/22/2012   HGB 10.6* 08/22/2012   HCT 31.3* 08/22/2012   PLT 148* 08/22/2012   GLUCOSE 157* 08/22/2012   CHOL 226* 06/15/2010   TRIG 213.0* 06/15/2010   HDL 62.60 06/15/2010   LDLDIRECT 131.9 06/15/2010   LDLCALC 99  05/20/2006   ALT 13 08/22/2012   AST 15 08/22/2012   NA 139 08/22/2012   K 3.3* 08/22/2012   CL 102 08/22/2012   CREATININE 1.60* 08/22/2012   BUN 39* 08/22/2012   CO2 24 08/22/2012   TSH 1.32 09/01/2010   INR 0.9 09/01/2010   HGBA1C 7.1* 11/05/2012   MICROALBUR 0.8 08/16/2011       Assessment & Plan:

## 2013-01-19 NOTE — Progress Notes (Signed)
Pre visit review using our clinic review tool, if applicable. No additional management support is needed unless otherwise documented below in the visit note. 

## 2013-01-20 ENCOUNTER — Other Ambulatory Visit: Payer: Self-pay | Admitting: *Deleted

## 2013-01-20 MED ORDER — AMOXICILLIN 875 MG PO TABS: 875.0000 mg | ORAL_TABLET | Freq: Two times a day (BID) | ORAL | Status: DC

## 2013-01-20 NOTE — Telephone Encounter (Signed)
Ok Amoxicillin - done [employer]

## 2013-01-20 NOTE — Telephone Encounter (Signed)
Pt informed

## 2013-01-22 ENCOUNTER — Encounter: Payer: MEDICARE | Admitting: Neurology

## 2013-01-22 ENCOUNTER — Telehealth: Payer: Self-pay | Admitting: *Deleted

## 2013-01-22 NOTE — Telephone Encounter (Signed)
Called pt's Ins- Zomig and Relpax PAs are approved. Zomig 5mg  nasal spray is approved 10/32/14 until 01/22/14. Pharmacy informed. Case ID is [id number]

## 2013-01-23 ENCOUNTER — Telehealth: Payer: Self-pay

## 2013-01-23 NOTE — Telephone Encounter (Signed)
I don't understand: What tests? Who ref the pt? Thx

## 2013-01-23 NOTE — Telephone Encounter (Signed)
Phone call from patient [phone] states Dr Patel(neurologist) cancelled her tests since she does not have numbness and tingling. She needs a regular office visit referral for her pain and severe headaches. Please advise.

## 2013-01-26 NOTE — Telephone Encounter (Signed)
I spoke to patient and she states you referred her for testing. She does not know the specific test.   All she needs know she said is a regular referral for office visit to the neurologist for her severe headaches and pain.

## 2013-01-27 NOTE — Telephone Encounter (Signed)
I'm not aware. No ref in the chart. Keep ROV Thx

## 2013-01-27 NOTE — Telephone Encounter (Signed)
Patient advised to keep her appt in Dec. There were no further questions or concerns.

## 2013-01-28 ENCOUNTER — Ambulatory Visit: Payer: MEDICARE | Admitting: Endocrinology

## 2013-01-28 ENCOUNTER — Telehealth: Payer: Self-pay | Admitting: Endocrinology

## 2013-01-28 NOTE — Telephone Encounter (Signed)
Pt states on 01/27/13 she checked blood sugar reading 40  Pt states she drank some [employer]a re-checked reading 60 This morning pt stated she felt awful 6 am blood sugar 60 drank coffee and [employer]a reading 80  Wants to know if she needs to be seen before her appt 03/25/13  Call back [phone]

## 2013-01-29 NOTE — Telephone Encounter (Signed)
Called pt and lvm advising her to please reduce levemir to 6 units at bedtime. Let us know if this helps with low blood sugar readings. Advised if she has any further questions to give [place] a call.

## 2013-01-29 NOTE — Telephone Encounter (Signed)
Please reduce levemir to 6 units at bedtime

## 2013-01-30 ENCOUNTER — Telehealth: Payer: Self-pay | Admitting: Internal Medicine

## 2013-01-30 ENCOUNTER — Ambulatory Visit: Payer: MEDICARE | Admitting: Endocrinology

## 2013-01-30 MED ORDER — AMOXICILLIN 875 MG PO TABS: 875.0000 mg | ORAL_TABLET | Freq: Two times a day (BID) | ORAL | Status: DC

## 2013-01-30 NOTE — Telephone Encounter (Signed)
Ok To refill Thx

## 2013-01-30 NOTE — Telephone Encounter (Signed)
Pt is still coughing up green mucus and her throat is sore.  She has finished the Amoxicillin.  She wants to know if she can have a refill to try to clear up before having cataract surgery on Dec. 1.  Please send it to [facility] in [place].

## 2013-01-30 NOTE — Telephone Encounter (Signed)
Done

## 2013-02-04 ENCOUNTER — Telehealth: Payer: Self-pay | Admitting: *Deleted

## 2013-02-04 NOTE — Telephone Encounter (Signed)
Omeprazole 40 mg PA is approved 01/14/13 until 02/04/2014. Pharmacy informed.

## 2013-02-16 ENCOUNTER — Encounter: Payer: Self-pay | Admitting: Internal Medicine

## 2013-02-16 ENCOUNTER — Ambulatory Visit (INDEPENDENT_AMBULATORY_CARE_PROVIDER_SITE_OTHER): Payer: MEDICARE | Admitting: Internal Medicine

## 2013-02-16 VITALS — BP 126/84 | HR 63 | Temp 98.5°F | Resp 16 | Ht 63.0 in | Wt 106.0 lb

## 2013-02-16 DIAGNOSIS — K219 Gastro-esophageal reflux disease without esophagitis: Secondary | ICD-10-CM

## 2013-02-16 DIAGNOSIS — R51 Headache: Secondary | ICD-10-CM

## 2013-02-16 DIAGNOSIS — E109 Type 1 diabetes mellitus without complications: Secondary | ICD-10-CM

## 2013-02-16 DIAGNOSIS — IMO0001 Reserved for inherently not codable concepts without codable children: Secondary | ICD-10-CM

## 2013-02-16 DIAGNOSIS — I10 Essential (primary) hypertension: Secondary | ICD-10-CM

## 2013-02-16 MED ORDER — VORTIOXETINE HBR 20 MG PO TABS: 20.0000 mg | ORAL_TABLET | Freq: Every day | ORAL | Status: DC

## 2013-02-16 MED ORDER — OXYCODONE HCL ER 20 MG PO T12A: 20.0000 mg | EXTENDED_RELEASE_TABLET | Freq: Two times a day (BID) | ORAL | Status: DC

## 2013-02-16 MED ORDER — OXYCODONE HCL 15 MG PO TABS: 15.0000 mg | ORAL_TABLET | Freq: Three times a day (TID) | ORAL | Status: DC | PRN

## 2013-02-16 NOTE — Progress Notes (Signed)
Pre visit review using our clinic review tool, if applicable. No additional management support is needed unless otherwise documented below in the visit note. 

## 2013-02-16 NOTE — Assessment & Plan Note (Signed)
Continue with current prescription therapy as reflected on the Med list.  

## 2013-02-16 NOTE — Progress Notes (Signed)
Subjective:     Migraine  This is a chronic problem. The current episode started more than 1 year ago. The problem occurs daily. The problem has been unchanged (may be better a little). The pain is located in the bilateral region. The pain radiates to the right neck and left neck. The pain quality is similar to prior headaches. The quality of the pain is described as shooting. The pain is at a severity of 7/10. The pain is severe. Associated symptoms include muscle aches, nausea and vomiting. The symptoms are aggravated by fatigue. Her past medical history is significant for cancer and migraine headaches.   Lindsey Gomez had a cataract surgery. HA is better a little. Gabapentin caused side effects - Mava stopped it a while ago... [name] denies OSA [name] severe  migraines. [name] on severe pain - neck, back.  The patient presents for a follow-up of chronic HAs, depression, anxiety, hypertension, chronic dyslipidemia, type 1 diabetes.  Problems with [name] at home; stress. F/umore anxiety; depressed - [facility] is helping at home. [name] has a housekeeper now    [employer] from Last 3 Encounters:  02/16/13 106 lb (48.081 kg)  01/19/13 104 lb (47.174 kg)  01/13/13 105 lb (47.628 kg)   BP Readings from Last 3 Encounters:  02/16/13 126/84  01/19/13 130/78  01/13/13 118/70      Review of Systems  Constitutional: Positive for fatigue. Negative for chills, activity change, appetite change and unexpected weight change.  HENT: Negative for congestion and mouth sores.   Eyes: Negative for visual disturbance.  Respiratory: Negative for chest tightness.   Gastrointestinal: Positive for nausea and vomiting.  Genitourinary: Negative for frequency, difficulty urinating and vaginal pain.  Musculoskeletal: Positive for arthralgias and gait problem.  Skin: Negative for pallor and rash.  Neurological: Negative for tremors.  Psychiatric/Behavioral: Negative for suicidal ideas, confusion, sleep disturbance  and self-injury. The patient is nervous/anxious.        Objective:   Physical Exam  Constitutional: She appears well-developed and well-nourished. No distress.  HENT:  Head: Normocephalic.  Right Ear: External ear normal.  Left Ear: External ear normal.  Nose: Nose normal.  Mouth/Throat: Oropharynx is clear and moist.  Eyes: Conjunctivae are normal. Pupils are equal, round, and reactive to light. Right eye exhibits no discharge. Left eye exhibits no discharge.  Neck: Normal range of motion. Neck supple. No JVD present. No tracheal deviation present. No thyromegaly present.  Cardiovascular: Normal rate, regular rhythm and normal heart sounds.   Pulmonary/Chest: No stridor. No respiratory distress. She has no wheezes.  Abdominal: Soft. Bowel sounds are normal. She exhibits no distension and no mass. There is no tenderness. There is no rebound and no guarding.  Musculoskeletal: She exhibits no edema and no tenderness.  Lymphadenopathy:    She has no cervical adenopathy.  Neurological: She displays normal reflexes. No cranial nerve deficit. She exhibits normal muscle tone. Coordination normal.  Skin: Skin is warm. No rash noted. No erythema.  Psychiatric: Her behavior is normal. Judgment and thought content normal.  Not depressed, not tearful - looks better     Lab Results  Component Value Date   WBC 9.6 08/22/2012   HGB 10.6* 08/22/2012   HCT 31.3* 08/22/2012   PLT 148* 08/22/2012   GLUCOSE 157* 08/22/2012   CHOL 226* 06/15/2010   TRIG 213.0* 06/15/2010   HDL 62.60 06/15/2010   LDLDIRECT 131.9 06/15/2010   LDLCALC 99 05/20/2006   ALT 13 08/22/2012   AST 15 08/22/2012  NA 139 08/22/2012   K 3.3* 08/22/2012   CL 102 08/22/2012   CREATININE 1.60* 08/22/2012   BUN 39* 08/22/2012   CO2 24 08/22/2012   TSH 1.32 09/01/2010   INR 0.9 09/01/2010   HGBA1C 7.1* 11/05/2012   MICROALBUR 0.8 08/16/2011       Assessment & Plan:

## 2013-02-27 ENCOUNTER — Other Ambulatory Visit: Payer: Self-pay

## 2013-03-07 ENCOUNTER — Other Ambulatory Visit: Payer: Self-pay | Admitting: Internal Medicine

## 2013-03-20 ENCOUNTER — Ambulatory Visit: Payer: MEDICARE | Admitting: Endocrinology

## 2013-03-20 ENCOUNTER — Ambulatory Visit (INDEPENDENT_AMBULATORY_CARE_PROVIDER_SITE_OTHER): Payer: MEDICARE | Admitting: Internal Medicine

## 2013-03-20 ENCOUNTER — Other Ambulatory Visit (INDEPENDENT_AMBULATORY_CARE_PROVIDER_SITE_OTHER): Payer: MEDICARE

## 2013-03-20 ENCOUNTER — Encounter: Payer: Self-pay | Admitting: Internal Medicine

## 2013-03-20 VITALS — BP 138/90 | HR 68 | Temp 98.4°F | Resp 16 | Wt 105.0 lb

## 2013-03-20 DIAGNOSIS — G43819 Other migraine, intractable, without status migrainosus: Secondary | ICD-10-CM

## 2013-03-20 DIAGNOSIS — E109 Type 1 diabetes mellitus without complications: Secondary | ICD-10-CM

## 2013-03-20 DIAGNOSIS — R51 Headache: Secondary | ICD-10-CM

## 2013-03-20 DIAGNOSIS — G43109 Migraine with aura, not intractable, without status migrainosus: Secondary | ICD-10-CM

## 2013-03-20 DIAGNOSIS — G43909 Migraine, unspecified, not intractable, without status migrainosus: Secondary | ICD-10-CM

## 2013-03-20 DIAGNOSIS — I10 Essential (primary) hypertension: Secondary | ICD-10-CM

## 2013-03-20 DIAGNOSIS — F1120 Opioid dependence, uncomplicated: Secondary | ICD-10-CM

## 2013-03-20 DIAGNOSIS — G43119 Migraine with aura, intractable, without status migrainosus: Secondary | ICD-10-CM

## 2013-03-20 DIAGNOSIS — IMO0001 Reserved for inherently not codable concepts without codable children: Secondary | ICD-10-CM

## 2013-03-20 DIAGNOSIS — R209 Unspecified disturbances of skin sensation: Secondary | ICD-10-CM

## 2013-03-20 DIAGNOSIS — R202 Paresthesia of skin: Secondary | ICD-10-CM

## 2013-03-20 DIAGNOSIS — F411 Generalized anxiety disorder: Secondary | ICD-10-CM

## 2013-03-20 LAB — HEPATIC FUNCTION PANEL
ALBUMIN: 3.5 g/dL (ref 3.5–5.2)
ALT: 12 U/L (ref 0–35)
AST: 16 U/L (ref 0–37)
Alkaline Phosphatase: 62 U/L (ref 39–117)
Bilirubin, Direct: 0.1 mg/dL (ref 0.0–0.3)
Total Bilirubin: 0.4 mg/dL (ref 0.3–1.2)
Total Protein: 6.1 g/dL (ref 6.0–8.3)

## 2013-03-20 LAB — CBC WITH DIFFERENTIAL/PLATELET
BASOS PCT: 0.5 % (ref 0.0–3.0)
Basophils Absolute: 0 10*3/uL (ref 0.0–0.1)
EOS ABS: 0.2 10*3/uL (ref 0.0–0.7)
EOS PCT: 3.1 % (ref 0.0–5.0)
HCT: 31.4 % — ABNORMAL LOW (ref 36.0–46.0)
Hemoglobin: 10.5 g/dL — ABNORMAL LOW (ref 12.0–15.0)
LYMPHS PCT: 40.7 % (ref 12.0–46.0)
Lymphs Abs: 2.3 10*3/uL (ref 0.7–4.0)
MCHC: 33.5 g/dL (ref 30.0–36.0)
MCV: 85.8 fl (ref 78.0–100.0)
Monocytes Absolute: 0.5 10*3/uL (ref 0.1–1.0)
Monocytes Relative: 8 % (ref 3.0–12.0)
Neutro Abs: 2.7 10*3/uL (ref 1.4–7.7)
Neutrophils Relative %: 47.7 % (ref 43.0–77.0)
Platelets: 160 10*3/uL (ref 150.0–400.0)
RBC: 3.66 Mil/uL — AB (ref 3.87–5.11)
RDW: 17 % — ABNORMAL HIGH (ref 11.5–14.6)
WBC: 5.8 10*3/uL (ref 4.5–10.5)

## 2013-03-20 LAB — BASIC METABOLIC PANEL
BUN: 14 mg/dL (ref 6–23)
CHLORIDE: 110 meq/L (ref 96–112)
CO2: 26 mEq/L (ref 19–32)
Calcium: 10 mg/dL (ref 8.4–10.5)
Creatinine, Ser: 1.2 mg/dL (ref 0.4–1.2)
GFR: 47.34 mL/min — ABNORMAL LOW (ref >60.00)
Glucose, Bld: 95 mg/dL (ref 70–99)
POTASSIUM: 3.6 meq/L (ref 3.5–5.1)
SODIUM: 141 meq/L (ref 135–145)

## 2013-03-20 LAB — SEDIMENTATION RATE: Sed Rate: 29 mm/hr — ABNORMAL HIGH (ref 0–22)

## 2013-03-20 LAB — VITAMIN B12: VITAMIN B 12: 578 pg/mL (ref 211–911)

## 2013-03-20 LAB — TSH: TSH: 1.93 u[IU]/mL (ref 0.35–5.50)

## 2013-03-20 LAB — HEMOGLOBIN A1C: HEMOGLOBIN A1C: 7.2 % — AB (ref 4.6–6.5)

## 2013-03-20 MED ORDER — MEPERIDINE HCL 50 MG/ML IJ SOLN
50.0000 mg | Freq: Once | INTRAMUSCULAR | Status: AC
  Administered 2013-03-20: 50 mg via INTRAMUSCULAR

## 2013-03-20 MED ORDER — OXYCODONE HCL 15 MG PO TABS: 15.0000 mg | ORAL_TABLET | Freq: Three times a day (TID) | ORAL | Status: DC | PRN

## 2013-03-20 MED ORDER — OXYCODONE HCL ER 20 MG PO T12A: 20.0000 mg | EXTENDED_RELEASE_TABLET | Freq: Two times a day (BID) | ORAL | Status: DC

## 2013-03-20 MED ORDER — PROMETHAZINE HCL 50 MG/ML IJ SOLN
50.0000 mg | Freq: Four times a day (QID) | INTRAMUSCULAR | Status: DC | PRN
  Administered 2013-03-20: 50 mg via INTRAMUSCULAR

## 2013-03-20 NOTE — Progress Notes (Signed)
Lindsey Gomez is here with her housekeeper: sick w/HA/N/V  Subjective:     Migraine  This is a chronic problem. The current episode started more than 1 year ago. The problem occurs daily. The problem has been gradually worsening (may be better a little). The pain is located in the bilateral region. The pain radiates to the right neck and left neck. The pain quality is similar to prior headaches. The quality of the pain is described as shooting. The pain is at a severity of 7/10. The pain is severe. Associated symptoms include muscle aches, nausea and vomiting. The symptoms are aggravated by fatigue. Her past medical history is significant for cancer and migraine headaches.   Lindsey Gomez had a cataract surgery. HA is worse  Gabapentin caused side effects - Lindsey Gomez stopped it a while ago... Lindsey Gomez denies OSA [name] severe  migraines. [name] on severe pain - neck, back.  The patient presents for a follow-up of chronic HAs, depression, anxiety, hypertension, chronic dyslipidemia, type 1 diabetes.  Problems with Lindsey Gomez at home; stress. [name] more anxiety; depressed - [facility] is helping at home. Lindsey Gomez has a housekeeper now [name] Readings from Last 3 Encounters:  03/20/13 105 lb (47.628 kg)  02/16/13 106 lb (48.081 kg)  01/19/13 104 lb (47.174 kg)   BP Readings from Last 3 Encounters:  03/20/13 138/90  02/16/13 126/84  01/19/13 130/78      Review of Systems  Constitutional: Positive for fatigue. Negative for chills, activity change, appetite change and unexpected weight change.  HENT: Negative for congestion and mouth sores.   Eyes: Negative for visual disturbance.  Respiratory: Negative for chest tightness.   Gastrointestinal: Positive for nausea and vomiting.  Genitourinary: Negative for frequency, difficulty urinating and vaginal pain.  Musculoskeletal: Positive for arthralgias and gait problem.  Skin: Negative for pallor and rash.  Neurological: Negative for tremors.  Psychiatric/Behavioral:  Negative for suicidal ideas, confusion, sleep disturbance and self-injury. The patient is nervous/anxious.        Objective:   Physical Exam  Constitutional: She appears well-developed and well-nourished. No distress.  HENT:  Head: Normocephalic.  Right Ear: External ear normal.  Left Ear: External ear normal.  Nose: Nose normal.  Mouth/Throat: Oropharynx is clear and moist.  Eyes: Conjunctivae are normal. Pupils are equal, round, and reactive to light. Right eye exhibits no discharge. Left eye exhibits no discharge.  Neck: Normal range of motion. Neck supple. No JVD present. No tracheal deviation present. No thyromegaly present.  Cardiovascular: Normal rate, regular rhythm and normal heart sounds.   Pulmonary/Chest: No stridor. No respiratory distress. She has no wheezes.  Abdominal: Soft. Bowel sounds are normal. She exhibits no distension and no mass. There is no tenderness. There is no rebound and no guarding.  Musculoskeletal: She exhibits no edema and no tenderness.  Lymphadenopathy:    She has no cervical adenopathy.  Neurological: She displays normal reflexes. No cranial nerve deficit. She exhibits normal muscle tone. Coordination normal.  Skin: Skin is warm. No rash noted. No erythema.  Psychiatric: Her behavior is normal. Judgment and thought content normal.  Not depressed, not tearful - looks better  in a dark room   Lab Results  Component Value Date   WBC 9.6 08/22/2012   HGB 10.6* 08/22/2012   HCT 31.3* 08/22/2012   PLT 148* 08/22/2012   GLUCOSE 157* 08/22/2012   CHOL 226* 06/15/2010   TRIG 213.0* 06/15/2010   HDL 62.60 06/15/2010   LDLDIRECT 131.9 06/15/2010  LDLCALC 99 05/20/2006   ALT 13 08/22/2012   AST 15 08/22/2012   NA 139 08/22/2012   K 3.3* 08/22/2012   CL 102 08/22/2012   CREATININE 1.60* 08/22/2012   BUN 39* 08/22/2012   CO2 24 08/22/2012   TSH 1.32 09/01/2010   INR 0.9 09/01/2010   HGBA1C 7.1* 11/05/2012   MICROALBUR 0.8 08/16/2011       Assessment & Plan:

## 2013-03-20 NOTE — Assessment & Plan Note (Signed)
Continue with current prescription therapy as reflected on the Med list.  

## 2013-03-20 NOTE — Assessment & Plan Note (Signed)
Continue with current prescription therapy as reflected on the Med list. Labs  

## 2013-03-20 NOTE — Patient Instructions (Signed)
Go to ER if worse 

## 2013-03-20 NOTE — Assessment & Plan Note (Signed)
Chronic migraines. Chronic pain syndrome  Potential benefits of a long term opioids use as well as potential risks (i.e. addiction risk, apnea etc) and complications (i.e. Somnolence, constipation and others) were explained to the patient and were aknowledged.  Lindsey Gomez's condition is worse.We discussed her options again.

## 2013-03-20 NOTE — Progress Notes (Signed)
Pre visit review using our clinic review tool, if applicable. No additional management support is needed unless otherwise documented below in the visit note. 

## 2013-03-23 NOTE — Assessment & Plan Note (Signed)
1/15: Kenika states pain meds do not work. We had another discussion of this issue with Alyshia. I advised Livianna to be admitted for inpatient detoxification. Thailyn says she can't do it due to Ralph's needs she has to be taking care of. Pain Clinic, Headache Clinic consult was offered as well Butch Penny declined).  Demerol/Prometh IM given

## 2013-03-23 NOTE — Assessment & Plan Note (Signed)
1/15: Vernesha states pain meds do not work. We had another discussion of this issue with Ava. I advised Tangie to be admitted for inpatient detoxification. Tyyne says she can't do it due to Ralph's needs she has to be taking care of. Pain Clinic, Headache Clinic consult was offered as well Butch Penny declined).

## 2013-03-23 NOTE — Assessment & Plan Note (Signed)
1/15: Pearlee states pain meds do not work. We had another discussion of this issue with Diasha. I advised Yazmine to be admitted for inpatient detoxification. Walida says she can't do it due to Ralph's needs she has to be taking care of. Pain Clinic, Headache Clinic consult was offered as well (Sharese declined). 

## 2013-03-23 NOTE — Assessment & Plan Note (Signed)
1/15: Latona states pain meds do not work. We had another discussion of this issue with Lennix. I advised Damien to be admitted for inpatient detoxification. Sammie says she can't do it due to Ralph's needs she has to be taking care of. Pain Clinic, Headache Clinic consult was offered as well (Eulalia declined). 

## 2013-03-23 NOTE — Assessment & Plan Note (Signed)
Continue with current prescription therapy as reflected on the Med list.  

## 2013-03-25 ENCOUNTER — Ambulatory Visit: Payer: MEDICARE | Admitting: Endocrinology

## 2013-04-03 ENCOUNTER — Encounter: Payer: Self-pay | Admitting: Endocrinology

## 2013-04-03 ENCOUNTER — Ambulatory Visit (INDEPENDENT_AMBULATORY_CARE_PROVIDER_SITE_OTHER): Payer: MEDICARE | Admitting: Endocrinology

## 2013-04-03 VITALS — BP 116/78 | HR 71 | Temp 97.7°F | Ht 63.0 in | Wt 106.0 lb

## 2013-04-03 DIAGNOSIS — E109 Type 1 diabetes mellitus without complications: Secondary | ICD-10-CM

## 2013-04-03 NOTE — Patient Instructions (Addendum)
Please reduce the novolog to 10 units with breakfast 5 units with lunch and 5 units with evening meal. check your blood sugar 4 times a day--before the 3 meals, and at bedtime.  also check if you have symptoms of your blood sugar being too high or too low.  please keep a record of the readings and bring it to your next appointment here.  please call us sooner if your blood sugar goes below 70, or if you have a lot of readings over 200.   Please come back for a follow-up appointment in 3 months.

## 2013-04-03 NOTE — Progress Notes (Signed)
Subjective:    Patient ID: Lindsey Gomez, female    DOB: 19-Apr-1945, 68 y.o.   MRN: [id number]  HPI Pt has h/o insulin-requiring DM (dx'ed 1990, after a 40% pancreatectomy at [facility], for pancreatitis; she has mild if any neuropathy of the lower extremities; no known associated complications; she had an episode of severe hypoglycemia in September, 2013; she has never had DKA).  she brings a record of her cbg's which i have reviewed today.  It is mildly low approx 1-2 times per week.  It varies from 43-300.  There is no trend throughout the day, except it is most often low at hs.   Past Medical History  Diagnosis Date  . Depression   . Type II or unspecified type diabetes mellitus without mention of complication, not stated as uncontrolled   . HTN (hypertension)   . Chronic pain   . Fibromyalgia   . Migraines   . History of breast cancer     Right  . Chronic pancreatitis   . Vitamin D deficiency   . GERD (gastroesophageal reflux disease)   . Finger dislocation 2009    L 3rd  . COPD (chronic obstructive pulmonary disease)   . Anemia   . Fractured sternum 11/2008  . Osteoporosis     Reclast too expensive  . Barrett's esophagus   . Chronic fatigue   . Diverticulosis   . Opioid dependence     Past Surgical History  Procedure Laterality Date  . Nasal sinus surgery    . Pancreatectomy      40%  . Cholecystectomy    . Appendectomy    . Abdominal hysterectomy      History   Social History  . Marital Status: Married    Spouse Name: [name]    Number of Children: N/A  . Years of Education: N/A   Occupational History  . 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 Smoking status: Current Every Day Smoker -- 2.00 packs/day  . Smokeless tobacco: Never Used  . Alcohol Use: No  . Drug Use: No  . Sexual Activity: No   Other Topics Concern  . Not on file   Social History Narrative   Did not go back to work Oct 23, [name] is very ill - unable to have  financial ends meet, no income      Regular Exercise -  NO      Coffee daily     Current Outpatient Prescriptions on File Prior to Visit  Medication Sig Dispense Refill  . ACCU-CHEK AVIVA PLUS test strip USE TO CHECK BLOOD SUGAR LEVELS 4 TIMES DAILY AS DIRECTED  100 each  5  . ACCU-CHEK SOFTCLIX LANCETS lancets Use as instructed  100 each  3  . amitriptyline (ELAVIL) 50 MG tablet Take 0.5-1 tablets (25-50 mg total) by mouth at bedtime.  60 tablet  11  . amLODipine-benazepril (LOTREL) 5-10 MG per capsule TAKE (1) CAPSULE DAILY.  30 capsule  11  . atenolol (TENORMIN) 100 MG tablet Take 1 tablet (100 mg total) by mouth every morning.  30 tablet  11  . atorvastatin (LIPITOR) 10 MG tablet Take 1 tablet (10 mg total) by mouth daily.  30 tablet  11  . B-D ULTRAFINE III SHORT PEN 31G X 8 MM MISC USE 5 TIMES A DAY AS DIRECTED  200 each  3  . calcitRIOL (ROCALTROL) 0.5 MCG capsule Take 2 capsules (1  mcg total) by mouth daily.  60 capsule  11  . diclofenac sodium (VOLTAREN) 1 % GEL Apply topically 4 (four) times daily as needed.        [name] glucagon 1 MG injection Inject 1 mg into the vein once as needed.  1 each  12  . hydrocortisone (CORTEF) 10 MG tablet TAKE 1 TABLET BY MOUTH TWICE DAILY IN THE MORNING AND AT LUNCH  60 tablet  2  . insulin detemir (LEVEMIR) 100 UNIT/ML injection Inject 9 Units into the skin at bedtime.       [name] Ketorolac Tromethamine (SPRIX) 15.75 MG/SPRAY SOLN 1 spray by Nasal route 3 (three) times daily as needed.        [name] LORazepam (ATIVAN) 2 MG tablet TAKE 1 TABLET BY MOUTH EVERY 8 HOURS AS NEEDED FOR ANXIETY  90 tablet  2  . mometasone (NASONEX) 50 MCG/ACT nasal spray Place 2 sprays into the nose daily.  17 g  11  . omeprazole (PRILOSEC) 40 MG capsule Take 1 capsule (40 mg total) by mouth 2 (two) times daily.  30 capsule  11  . OxyCODONE (OXYCONTIN) 20 mg T12A 12 hr tablet Take 1 tablet (20 mg total) by mouth every 12 (twelve) hours.  60 tablet  0  . oxyCODONE (ROXICODONE) 15 MG  immediate release tablet Take 1 tablet (15 mg total) by mouth every 8 (eight) hours as needed for pain.  90 tablet  0  . prochlorperazine (COMPAZINE) 10 MG tablet Take 1 tablet (10 mg total) by mouth every 8 (eight) hours as needed.  90 tablet  1  . prochlorperazine (COMPAZINE) 10 MG tablet TAKE 1 TABLET (10 MG TOTAL) BY MOUTH EVERY 8 (EIGHT) HOURS AS NEEDED.  180 tablet  1  . ranitidine (ZANTAC) 300 MG tablet Take 1 tablet (300 mg total) by mouth at bedtime.  30 tablet  11  . tamoxifen (NOLVADEX) 20 MG tablet Take 20 mg by mouth 2 (two) times daily.        [name] topiramate (TOPAMAX) 200 MG tablet Take 1 tablet (200 mg total) by mouth 2 (two) times daily.  60 tablet  5  . triamcinolone (KENALOG) 0.1 % ointment Apply topically 2 (two) times daily as needed.        . Vortioxetine HBr (BRINTELLIX) 20 MG TABS Take 20 mg by mouth daily.  30 tablet  5  . zolmitriptan (ZOMIG) 5 MG nasal solution Place 1 spray into the nose as needed for migraine.  6 Units  5  . zolmitriptan (ZOMIG) 5 MG tablet TAKE 1 TABLET BY MOUTH AS NEEDED MIGRAINE  8 tablet  5   Current Facility-Administered Medications on File Prior to Visit  Medication Dose Route Frequency Provider Last Rate Last Dose  . 0.9 %  sodium chloride infusion  500 mL Intravenous Continuous Inda Castle, MD      . promethazine (PHENERGAN) injection 50 mg  50 mg Intramuscular Q6H PRN Cassandria Anger, MD   50 mg at 03/20/13 1601    Allergies  Allergen Reactions  . Gabapentin     Weak muscles  . Milnacipran     REACTION: HA and hand swelling  . Moxifloxacin   . Rizatriptan Benzoate     REACTION: n/v  . Sulfonamide Derivatives   . Venlafaxine     Family History  Problem Relation Age of Onset  . Colon cancer Neg Hx   . COPD Father   . Heart disease Father   . Leukemia  Mother   . Diabetes Maternal Uncle     BP 116/78  Pulse 71  Temp(Src) 97.7 F (36.5 C) (Oral)  Ht 5\' 3"  (1.6 m)  Wt 106 lb (48.081 kg)  BMI 18.78 kg/m2  SpO2  99%  Review of Systems She has lost a few lbs.  Denies recent LOC.      Objective:   Physical Exam VITAL SIGNS:  See vs page GENERAL: no distress  Lab Results  Component Value Date   HGBA1C 7.2* 03/20/2013      Assessment & Plan:  DM: although a1c is over 7%, variable cbg's are limiting rx. Weight loss: this is probably reducing insulin requirement. Pancreatic insufficiency: in this context, she needs only a low dosage of insulin.

## 2013-04-17 ENCOUNTER — Encounter: Payer: Self-pay | Admitting: Internal Medicine

## 2013-04-17 ENCOUNTER — Ambulatory Visit (INDEPENDENT_AMBULATORY_CARE_PROVIDER_SITE_OTHER): Payer: MEDICARE | Admitting: Internal Medicine

## 2013-04-17 VITALS — BP 120/62 | HR 76 | Temp 98.5°F | Resp 16 | Wt 103.0 lb

## 2013-04-17 DIAGNOSIS — F1120 Opioid dependence, uncomplicated: Secondary | ICD-10-CM

## 2013-04-17 DIAGNOSIS — R51 Headache: Secondary | ICD-10-CM

## 2013-04-17 DIAGNOSIS — E109 Type 1 diabetes mellitus without complications: Secondary | ICD-10-CM

## 2013-04-17 DIAGNOSIS — G43109 Migraine with aura, not intractable, without status migrainosus: Secondary | ICD-10-CM

## 2013-04-17 DIAGNOSIS — G43909 Migraine, unspecified, not intractable, without status migrainosus: Secondary | ICD-10-CM

## 2013-04-17 MED ORDER — METHYLPREDNISOLONE ACETATE 80 MG/ML IJ SUSP
80.0000 mg | Freq: Once | INTRAMUSCULAR | Status: DC
  Administered 2013-04-17: 80 mg via INTRAMUSCULAR

## 2013-04-17 MED ORDER — ONDANSETRON HCL 4 MG/2ML IJ SOLN
4.0000 mg | Freq: Once | INTRAMUSCULAR | Status: AC
  Administered 2013-04-17: 4 mg via INTRAMUSCULAR

## 2013-04-17 MED ORDER — OXYCODONE HCL 15 MG PO TABS: 15.0000 mg | ORAL_TABLET | Freq: Three times a day (TID) | ORAL | Status: DC | PRN

## 2013-04-17 MED ORDER — MEPERIDINE HCL 50 MG/ML IJ SOLN
50.0000 mg | Freq: Once | INTRAMUSCULAR | Status: AC
  Administered 2013-04-17: 50 mg via INTRAMUSCULAR

## 2013-04-17 MED ORDER — OXYCODONE HCL ER 20 MG PO T12A: 20.0000 mg | EXTENDED_RELEASE_TABLET | Freq: Two times a day (BID) | ORAL | Status: DC

## 2013-04-17 MED ORDER — TOPIRAMATE 200 MG PO TABS: 100.0000 mg | ORAL_TABLET | Freq: Two times a day (BID) | ORAL | Status: DC

## 2013-04-17 NOTE — Progress Notes (Signed)
Pre visit review using our clinic review tool, if applicable. No additional management support is needed unless otherwise documented below in the visit note. 

## 2013-04-17 NOTE — Progress Notes (Signed)
Lindsey Gomez is here with her housekeeper Lindsey Gomez: sick w/HA/N/V again  Subjective:     Migraine  This is a chronic problem. The current episode started more than 1 year ago. The problem occurs daily. The problem has been waxing and waning (may be better a little). The pain is located in the bilateral region. The pain radiates to the right neck and left neck. The pain quality is similar to prior headaches. The quality of the pain is described as shooting. The pain is at a severity of 7/10. The pain is severe. Associated symptoms include muscle aches, nausea and vomiting. The symptoms are aggravated by fatigue. Her past medical history is significant for cancer and migraine headaches.   Lindsey Gomez had a cataract surgery. HA is worse  Gabapentin caused side effects - Lindsey Gomez stopped it a while ago... Lindsey Gomez denies OSA [name] severe  migraines. [name] on severe pain - neck, back.  The patient presents for a follow-up of chronic HAs, depression, anxiety, hypertension, chronic dyslipidemia, type 1 diabetes.  Problems with Lindsey Gomez at home; stress. [name] more anxiety; depressed - [facility] is helping at home. Lindsey Gomez has a housekeeper now [name] Readings from Last 3 Encounters:  04/17/13 103 lb (46.72 kg)  04/03/13 106 lb (48.081 kg)  03/20/13 105 lb (47.628 kg)   BP Readings from Last 3 Encounters:  04/17/13 120/62  04/03/13 116/78  03/20/13 138/90      Review of Systems  Constitutional: Positive for fatigue. Negative for chills, activity change, appetite change and unexpected weight change.  HENT: Negative for congestion and mouth sores.   Eyes: Negative for visual disturbance.  Respiratory: Negative for chest tightness.   Gastrointestinal: Positive for nausea and vomiting.  Genitourinary: Negative for frequency, difficulty urinating and vaginal pain.  Musculoskeletal: Positive for arthralgias and gait problem.  Skin: Negative for pallor and rash.  Neurological: Negative for tremors.   Psychiatric/Behavioral: Negative for suicidal ideas, confusion, sleep disturbance and self-injury. The patient is nervous/anxious.        Objective:   Physical Exam  Constitutional: She appears well-developed and well-nourished. No distress.  HENT:  Head: Normocephalic.  Right Ear: External ear normal.  Left Ear: External ear normal.  Nose: Nose normal.  Mouth/Throat: Oropharynx is clear and moist.  Eyes: Conjunctivae are normal. Pupils are equal, round, and reactive to light. Right eye exhibits no discharge. Left eye exhibits no discharge.  Neck: Normal range of motion. Neck supple. No JVD present. No tracheal deviation present. No thyromegaly present.  Cardiovascular: Normal rate, regular rhythm and normal heart sounds.   Pulmonary/Chest: No stridor. No respiratory distress. She has no wheezes.  Abdominal: Soft. Bowel sounds are normal. She exhibits no distension and no mass. There is no tenderness. There is no rebound and no guarding.  Musculoskeletal: She exhibits no edema and no tenderness.  Lymphadenopathy:    She has no cervical adenopathy.  Neurological: She displays normal reflexes. No cranial nerve deficit. She exhibits normal muscle tone. Coordination normal.  Skin: Skin is warm. No rash noted. No erythema.  Psychiatric: Her behavior is normal. Judgment and thought content normal.  Not depressed, not tearful - looks better  in a dark room   Lab Results  Component Value Date   WBC 5.8 03/20/2013   HGB 10.5* 03/20/2013   HCT 31.4* 03/20/2013   PLT 160.0 03/20/2013   GLUCOSE 95 03/20/2013   CHOL 226* 06/15/2010   TRIG 213.0* 06/15/2010   HDL 62.60 06/15/2010   LDLDIRECT 131.9  06/15/2010   LDLCALC 99 05/20/2006   ALT 12 03/20/2013   AST 16 03/20/2013   NA 141 03/20/2013   K 3.6 03/20/2013   CL 110 03/20/2013   CREATININE 1.2 03/20/2013   BUN 14 03/20/2013   CO2 26 03/20/2013   TSH 1.93 03/20/2013   INR 0.9 09/01/2010   HGBA1C 7.2* 03/20/2013   MICROALBUR 0.8 08/16/2011       Assessment & Plan:

## 2013-04-17 NOTE — Patient Instructions (Signed)
Stop Amlodipin-Benazepril and Ranitidine Topiramax 1/2 tablet twice a day  Go to the hospital if worse

## 2013-04-18 ENCOUNTER — Encounter: Payer: Self-pay | Admitting: Internal Medicine

## 2013-04-18 NOTE — Assessment & Plan Note (Signed)
Chronic and severe  Potential benefits of a long term opioids use as well as potential risks (i.e. addiction risk, apnea etc) and complications (i.e. Somnolence, constipation and others) were explained to the patient and were aknowledged. 2/14 - worse Demerol/Zofran IM Advised to go to ER if problems

## 2013-04-18 NOTE — Assessment & Plan Note (Addendum)
Continue with current prescription therapy as reflected on the Med list for now    She has been on prescription opioids only (no illicit drug use ever) for a long time. Se has not been able to have any quality of life w/o opioids due to her persistent migraines, neck pain and FMS. She is aware she needs to use as little as possible of her Rx. She has a very difficult situation at home

## 2013-04-18 NOTE — Assessment & Plan Note (Signed)
[name] w/Dr Ellison 

## 2013-04-20 ENCOUNTER — Telehealth: Payer: Self-pay | Admitting: Internal Medicine

## 2013-04-20 ENCOUNTER — Other Ambulatory Visit: Payer: Self-pay | Admitting: *Deleted

## 2013-04-20 MED ORDER — HYDROCORTISONE 10 MG PO TABS: 10.0000 mg | ORAL_TABLET | Freq: Two times a day (BID) | ORAL | Status: DC

## 2013-04-20 MED ORDER — CALCITRIOL 0.5 MCG PO CAPS: 1.0000 ug | ORAL_CAPSULE | Freq: Every day | ORAL | Status: DC

## 2013-04-20 MED ORDER — PROCHLORPERAZINE MALEATE 10 MG PO TABS: 10.0000 mg | ORAL_TABLET | Freq: Three times a day (TID) | ORAL | Status: DC | PRN

## 2013-04-20 MED ORDER — OMEPRAZOLE 40 MG PO CPDR: 40.0000 mg | DELAYED_RELEASE_CAPSULE | Freq: Two times a day (BID) | ORAL | Status: DC

## 2013-04-20 NOTE — Telephone Encounter (Signed)
Relevant patient education mailed to patient.  

## 2013-04-22 ENCOUNTER — Other Ambulatory Visit: Payer: MEDICARE

## 2013-04-22 ENCOUNTER — Telehealth: Payer: Self-pay

## 2013-04-22 NOTE — Telephone Encounter (Signed)
Pt called stating that the last 30 out 43 reading have been over 200. Pt states that her current insulin regimen is 10 units of Humalog with breakfast, 5 with lunch, and 5 with dinner. She is also taking 6 units of Levemir in the evening.  Please advise,  Thanks!

## 2013-04-22 NOTE — Telephone Encounter (Signed)
please call patient: What time of day is it highest and lowest? However, if there is no trend throughout the day, please let me know that.

## 2013-04-23 NOTE — Telephone Encounter (Signed)
Ov tomorrow 

## 2013-04-23 NOTE — Telephone Encounter (Signed)
Pt states she does not see a trend that its been sporadic throughout the day.

## 2013-04-23 NOTE — Telephone Encounter (Signed)
i hesitate to increase, because your a1c has been good, and you had a bad spell of low-blood sugar in 2013.   Please continue the same insulin for now.

## 2013-04-23 NOTE — Telephone Encounter (Signed)
Pt coming in for appointment at 3 pm.

## 2013-04-23 NOTE — Telephone Encounter (Signed)
Pt informed. She states that this afternoon her levels were 400.

## 2013-04-24 ENCOUNTER — Ambulatory Visit: Payer: MEDICARE | Admitting: Endocrinology

## 2013-04-27 ENCOUNTER — Ambulatory Visit: Payer: MEDICARE | Admitting: Endocrinology

## 2013-04-29 ENCOUNTER — Inpatient Hospital Stay: Admission: RE | Admit: 2013-04-29 | Payer: MEDICARE | Source: Ambulatory Visit

## 2013-05-01 ENCOUNTER — Other Ambulatory Visit: Payer: Self-pay | Admitting: Internal Medicine

## 2013-05-06 ENCOUNTER — Ambulatory Visit (INDEPENDENT_AMBULATORY_CARE_PROVIDER_SITE_OTHER)
Admission: RE | Admit: 2013-05-06 | Discharge: 2013-05-06 | Disposition: A | Discharge status: Home / Self Care | Payer: Medicare Other | Source: Ambulatory Visit | Attending: Internal Medicine | Admitting: Internal Medicine

## 2013-05-06 DIAGNOSIS — R51 Headache: Secondary | ICD-10-CM

## 2013-05-08 ENCOUNTER — Telehealth: Payer: Self-pay

## 2013-05-08 NOTE — Telephone Encounter (Signed)
The patient called and is hoping to get a refill of her Lorazepam rx   Callback- [phone]

## 2013-05-08 NOTE — Telephone Encounter (Signed)
Done. Thx.

## 2013-05-15 ENCOUNTER — Ambulatory Visit (INDEPENDENT_AMBULATORY_CARE_PROVIDER_SITE_OTHER): Payer: Medicare Other | Admitting: Internal Medicine

## 2013-05-15 ENCOUNTER — Encounter: Payer: Self-pay | Admitting: Internal Medicine

## 2013-05-15 VITALS — BP 138/84 | HR 80 | Temp 99.0°F | Resp 16 | Wt 101.0 lb

## 2013-05-15 DIAGNOSIS — F1120 Opioid dependence, uncomplicated: Secondary | ICD-10-CM

## 2013-05-15 DIAGNOSIS — K5900 Constipation, unspecified: Secondary | ICD-10-CM | POA: Insufficient documentation

## 2013-05-15 DIAGNOSIS — R5381 Other malaise: Secondary | ICD-10-CM

## 2013-05-15 DIAGNOSIS — G43819 Other migraine, intractable, without status migrainosus: Secondary | ICD-10-CM

## 2013-05-15 DIAGNOSIS — F329 Major depressive disorder, single episode, unspecified: Secondary | ICD-10-CM

## 2013-05-15 DIAGNOSIS — G43119 Migraine with aura, intractable, without status migrainosus: Secondary | ICD-10-CM

## 2013-05-15 DIAGNOSIS — R5383 Other fatigue: Secondary | ICD-10-CM

## 2013-05-15 DIAGNOSIS — F29 Unspecified psychosis not due to a substance or known physiological condition: Secondary | ICD-10-CM

## 2013-05-15 DIAGNOSIS — F3289 Other specified depressive episodes: Secondary | ICD-10-CM

## 2013-05-15 MED ORDER — ATENOLOL 100 MG PO TABS: 100.0000 mg | ORAL_TABLET | ORAL | Status: DC

## 2013-05-15 MED ORDER — HYDROCORTISONE 10 MG PO TABS: 10.0000 mg | ORAL_TABLET | Freq: Two times a day (BID) | ORAL | Status: DC

## 2013-05-15 MED ORDER — OXYCODONE HCL 15 MG PO TABS: 15.0000 mg | ORAL_TABLET | Freq: Three times a day (TID) | ORAL | Status: DC | PRN

## 2013-05-15 MED ORDER — LINACLOTIDE 290 MCG PO CAPS: 290.0000 ug | ORAL_CAPSULE | Freq: Every day | ORAL | Status: DC

## 2013-05-15 MED ORDER — TOPIRAMATE 200 MG PO TABS: 100.0000 mg | ORAL_TABLET | Freq: Every day | ORAL | Status: DC

## 2013-05-15 MED ORDER — OXYCODONE HCL ER 20 MG PO T12A: 20.0000 mg | EXTENDED_RELEASE_TABLET | Freq: Two times a day (BID) | ORAL | Status: DC

## 2013-05-15 MED ORDER — AZITHROMYCIN 250 MG PO TABS: ORAL_TABLET | ORAL | Status: DC

## 2013-05-15 MED ORDER — ATORVASTATIN CALCIUM 10 MG PO TABS: 10.0000 mg | ORAL_TABLET | Freq: Every day | ORAL | Status: DC

## 2013-05-15 MED ORDER — CALCITRIOL 0.5 MCG PO CAPS: 1.0000 ug | ORAL_CAPSULE | Freq: Every day | ORAL | Status: DC

## 2013-05-15 NOTE — Assessment & Plan Note (Signed)
Better  

## 2013-05-15 NOTE — Assessment & Plan Note (Signed)
Discussed.

## 2013-05-15 NOTE — Progress Notes (Signed)
Pre visit review using our clinic review tool, if applicable. No additional management support is needed unless otherwise documented below in the visit note. 

## 2013-05-15 NOTE — Assessment & Plan Note (Signed)
Continue with current prescription therapy as reflected on the Med list.  

## 2013-05-15 NOTE — Progress Notes (Signed)
Lindsey Gomez is here with her housekeeper Lindsey Gomez  Subjective:   Doing much better!  Migraine  This is a chronic problem. The current episode started more than 1 year ago. The problem occurs daily. The problem has been waxing and waning (may be better a little). The pain is located in the bilateral region. The pain radiates to the right neck and left neck. The pain quality is similar to prior headaches. The quality of the pain is described as shooting. The pain is at a severity of 7/10. The pain is severe. Associated symptoms include muscle aches, nausea and vomiting. The symptoms are aggravated by fatigue. Her past medical history is significant for cancer and migraine headaches.   Lindsey Gomez had a cataract surgery. HA is worse  Lindsey Gomez denies OSA [name] severe  Migraines - better. [name] on severe pain - neck, back.  The patient presents for a follow-up of chronic HAs, depression, anxiety, hypertension, chronic dyslipidemia, type 1 diabetes.  Problems with Lindsey Gomez at home; stress. [name] more anxiety; depressed - [facility] is helping at home. Lindsey Gomez has a housekeeper now Lindsey Gomez Readings from Last 3 Encounters:  05/15/13 101 lb (45.813 kg)  04/17/13 103 lb (46.72 kg)  04/03/13 106 lb (48.081 kg)   BP Readings from Last 3 Encounters:  05/15/13 138/84  04/17/13 120/62  04/03/13 116/78      Review of Systems  Constitutional: Positive for fatigue. Negative for chills, activity change, appetite change and unexpected weight change.  HENT: Negative for congestion and mouth sores.   Eyes: Negative for visual disturbance.  Respiratory: Negative for chest tightness.   Gastrointestinal: Positive for nausea and vomiting.  Genitourinary: Negative for frequency, difficulty urinating and vaginal pain.  Musculoskeletal: Positive for arthralgias and gait problem.  Skin: Negative for pallor and rash.  Neurological: Negative for tremors.  Psychiatric/Behavioral: Negative for suicidal ideas, confusion, sleep  disturbance and self-injury. The patient is nervous/anxious.        Objective:   Physical Exam  Constitutional: She appears well-developed and well-nourished. No distress.  HENT:  Head: Normocephalic.  Right Ear: External ear normal.  Left Ear: External ear normal.  Nose: Nose normal.  Mouth/Throat: Oropharynx is clear and moist.  Eyes: Conjunctivae are normal. Pupils are equal, round, and reactive to light. Right eye exhibits no discharge. Left eye exhibits no discharge.  Neck: Normal range of motion. Neck supple. No JVD present. No tracheal deviation present. No thyromegaly present.  Cardiovascular: Normal rate, regular rhythm and normal heart sounds.   Pulmonary/Chest: No stridor. No respiratory distress. She has no wheezes.  Abdominal: Soft. Bowel sounds are normal. She exhibits no distension and no mass. There is no tenderness. There is no rebound and no guarding.  Musculoskeletal: She exhibits no edema and no tenderness.  Lymphadenopathy:    She has no cervical adenopathy.  Neurological: She displays normal reflexes. No cranial nerve deficit. She exhibits normal muscle tone. Coordination normal.  Skin: Skin is warm. No rash noted. No erythema.  Psychiatric: Her behavior is normal. Judgment and thought content normal.  Not depressed, not tearful - looks better    Lab Results  Component Value Date   WBC 5.8 03/20/2013   HGB 10.5* 03/20/2013   HCT 31.4* 03/20/2013   PLT 160.0 03/20/2013   GLUCOSE 95 03/20/2013   CHOL 226* 06/15/2010   TRIG 213.0* 06/15/2010   HDL 62.60 06/15/2010   LDLDIRECT 131.9 06/15/2010   LDLCALC 99 05/20/2006   ALT 12 03/20/2013   AST  16 03/20/2013   NA 141 03/20/2013   K 3.6 03/20/2013   CL 110 03/20/2013   CREATININE 1.2 03/20/2013   BUN 14 03/20/2013   CO2 26 03/20/2013   TSH 1.93 03/20/2013   INR 0.9 09/01/2010   HGBA1C 7.2* 03/20/2013   MICROALBUR 0.8 08/16/2011       Assessment & Plan:

## 2013-05-15 NOTE — Assessment & Plan Note (Signed)
Linzess prn 

## 2013-05-25 ENCOUNTER — Other Ambulatory Visit: Payer: Self-pay | Admitting: Internal Medicine

## 2013-05-27 ENCOUNTER — Other Ambulatory Visit: Payer: Self-pay

## 2013-05-27 MED ORDER — INSULIN DETEMIR 100 UNIT/ML ~~LOC~~ SOLN: 9.0000 [IU] | Freq: Every day | SUBCUTANEOUS | Status: DC

## 2013-06-10 ENCOUNTER — Other Ambulatory Visit: Payer: Self-pay | Admitting: Internal Medicine

## 2013-06-19 ENCOUNTER — Encounter: Payer: Self-pay | Admitting: Internal Medicine

## 2013-06-19 ENCOUNTER — Other Ambulatory Visit (INDEPENDENT_AMBULATORY_CARE_PROVIDER_SITE_OTHER): Payer: Medicare Other

## 2013-06-19 ENCOUNTER — Encounter: Payer: Self-pay | Admitting: *Deleted

## 2013-06-19 ENCOUNTER — Ambulatory Visit (INDEPENDENT_AMBULATORY_CARE_PROVIDER_SITE_OTHER): Payer: Medicare Other | Admitting: Internal Medicine

## 2013-06-19 VITALS — BP 160/100 | HR 68 | Temp 98.6°F | Wt 98.5 lb

## 2013-06-19 DIAGNOSIS — E109 Type 1 diabetes mellitus without complications: Secondary | ICD-10-CM

## 2013-06-19 DIAGNOSIS — G43819 Other migraine, intractable, without status migrainosus: Secondary | ICD-10-CM

## 2013-06-19 DIAGNOSIS — G43119 Migraine with aura, intractable, without status migrainosus: Secondary | ICD-10-CM

## 2013-06-19 DIAGNOSIS — F1120 Opioid dependence, uncomplicated: Secondary | ICD-10-CM

## 2013-06-19 DIAGNOSIS — I10 Essential (primary) hypertension: Secondary | ICD-10-CM

## 2013-06-19 LAB — URINALYSIS, ROUTINE W REFLEX MICROSCOPIC
BILIRUBIN URINE: NEGATIVE
Hgb urine dipstick: NEGATIVE
Ketones, ur: NEGATIVE
NITRITE: NEGATIVE
PH: 7.5 (ref 5.0–8.0)
Specific Gravity, Urine: 1.01 (ref 1.000–1.030)
Total Protein, Urine: NEGATIVE
Urine Glucose: NEGATIVE
Urobilinogen, UA: 0.2 (ref 0.0–1.0)

## 2013-06-19 LAB — CBC WITH DIFFERENTIAL/PLATELET
BASOS ABS: 0 10*3/uL (ref 0.0–0.1)
Basophils Relative: 0.6 % (ref 0.0–3.0)
EOS PCT: 1.1 % (ref 0.0–5.0)
Eosinophils Absolute: 0.1 10*3/uL (ref 0.0–0.7)
HCT: 30.7 % — ABNORMAL LOW (ref 36.0–46.0)
HEMOGLOBIN: 10.2 g/dL — AB (ref 12.0–15.0)
LYMPHS ABS: 2.4 10*3/uL (ref 0.7–4.0)
LYMPHS PCT: 41.7 % (ref 12.0–46.0)
MCHC: 33.3 g/dL (ref 30.0–36.0)
MCV: 87.6 fl (ref 78.0–100.0)
MONOS PCT: 6.6 % (ref 3.0–12.0)
Monocytes Absolute: 0.4 10*3/uL (ref 0.1–1.0)
NEUTROS ABS: 2.9 10*3/uL (ref 1.4–7.7)
Neutrophils Relative %: 50 % (ref 43.0–77.0)
Platelets: 152 10*3/uL (ref 150.0–400.0)
RBC: 3.5 Mil/uL — ABNORMAL LOW (ref 3.87–5.11)
RDW: 16.3 % — AB (ref 11.5–14.6)
WBC: 5.8 10*3/uL (ref 4.5–10.5)

## 2013-06-19 LAB — URINALYSIS
Bilirubin Urine: NEGATIVE
Hgb urine dipstick: NEGATIVE
KETONES UR: NEGATIVE
Nitrite: NEGATIVE
SPECIFIC GRAVITY, URINE: 1.01 (ref 1.000–1.030)
Total Protein, Urine: NEGATIVE
UROBILINOGEN UA: 0.2 (ref 0.0–1.0)
Urine Glucose: NEGATIVE
pH: 7.5 (ref 5.0–8.0)

## 2013-06-19 LAB — HEPATIC FUNCTION PANEL
ALK PHOS: 72 U/L (ref 39–117)
ALT: 16 U/L (ref 0–35)
AST: 18 U/L (ref 0–37)
Albumin: 3.5 g/dL (ref 3.5–5.2)
BILIRUBIN TOTAL: 0.3 mg/dL (ref 0.3–1.2)
Bilirubin, Direct: 0 mg/dL (ref 0.0–0.3)
TOTAL PROTEIN: 6.5 g/dL (ref 6.0–8.3)

## 2013-06-19 LAB — BASIC METABOLIC PANEL
BUN: 23 mg/dL (ref 6–23)
CO2: 26 mEq/L (ref 19–32)
CREATININE: 1.7 mg/dL — AB (ref 0.4–1.2)
Calcium: 10.9 mg/dL — ABNORMAL HIGH (ref 8.4–10.5)
Chloride: 104 mEq/L (ref 96–112)
GFR: 32.61 mL/min — ABNORMAL LOW (ref >60.00)
GLUCOSE: 141 mg/dL — AB (ref 70–99)
Potassium: 3.7 mEq/L (ref 3.5–5.1)
Sodium: 138 mEq/L (ref 135–145)

## 2013-06-19 LAB — MAGNESIUM: MAGNESIUM: 2 mg/dL (ref 1.5–2.5)

## 2013-06-19 LAB — HEMOGLOBIN A1C: Hgb A1c MFr Bld: 7.1 % — ABNORMAL HIGH (ref 4.6–6.5)

## 2013-06-19 MED ORDER — OXYCODONE HCL 15 MG PO TABS: 15.0000 mg | ORAL_TABLET | Freq: Three times a day (TID) | ORAL | Status: DC | PRN

## 2013-06-19 MED ORDER — OXYCODONE HCL ER 20 MG PO T12A: 20.0000 mg | EXTENDED_RELEASE_TABLET | Freq: Two times a day (BID) | ORAL | Status: DC

## 2013-06-19 NOTE — Assessment & Plan Note (Signed)
Worse. Demerol/Zofran IM To ER if not better

## 2013-06-19 NOTE — Assessment & Plan Note (Signed)
Continue with current prescription therapy as reflected on the Med list.  

## 2013-06-19 NOTE — Progress Notes (Signed)
Pre visit review using our clinic review tool, if applicable. No additional management support is needed unless otherwise documented below in the visit note. 

## 2013-06-19 NOTE — Progress Notes (Signed)
Lindsey Gomez is here with her housekeeper [name]  Subjective:   Doing worse again!  Migraine  This is a chronic problem. The current episode started more than 1 year ago. The problem occurs daily. The problem has been waxing and waning (may be better a little). The pain is located in the bilateral region. The pain radiates to the right neck and left neck. The pain quality is similar to prior headaches. The quality of the pain is described as shooting. The pain is at a severity of 7/10. The pain is severe. Associated symptoms include muscle aches, nausea and vomiting. The symptoms are aggravated by fatigue. Her past medical history is significant for cancer and migraine headaches.   Inda had a cataract surgery. HA is worse  Ronell denies OSA [name] severe  Migraines - better. [name] on severe pain - neck, back.  The patient presents for a follow-up of chronic HAs, depression, anxiety, hypertension, chronic dyslipidemia, type 1 diabetes.  Problems with [name] at home; stress. [name] more anxiety; depressed - [facility] is helping at home. Tiersa has a housekeeper now [name] Readings from Last 3 Encounters:  06/19/13 98 lb 8 oz (44.679 kg)  05/15/13 101 lb (45.813 kg)  04/17/13 103 lb (46.72 kg)   BP Readings from Last 3 Encounters:  06/19/13 160/100  05/15/13 138/84  04/17/13 120/62      Review of Systems  Constitutional: Positive for fatigue. Negative for chills, activity change, appetite change and unexpected weight change.  HENT: Negative for congestion and mouth sores.   Eyes: Negative for visual disturbance.  Respiratory: Negative for chest tightness.   Gastrointestinal: Positive for nausea and vomiting.  Genitourinary: Negative for frequency, difficulty urinating and vaginal pain.  Musculoskeletal: Positive for arthralgias and gait problem.  Skin: Negative for pallor and rash.  Neurological: Negative for tremors.  Psychiatric/Behavioral: Negative for suicidal ideas, confusion, sleep  disturbance and self-injury. The patient is nervous/anxious.        Objective:   Physical Exam  Constitutional: She appears well-developed and well-nourished. No distress.  HENT:  Head: Normocephalic.  Right Ear: External ear normal.  Left Ear: External ear normal.  Nose: Nose normal.  Mouth/Throat: Oropharynx is clear and moist.  Eyes: Conjunctivae are normal. Pupils are equal, round, and reactive to light. Right eye exhibits no discharge. Left eye exhibits no discharge.  Neck: Normal range of motion. Neck supple. No JVD present. No tracheal deviation present. No thyromegaly present.  Cardiovascular: Normal rate, regular rhythm and normal heart sounds.   Pulmonary/Chest: No stridor. No respiratory distress. She has no wheezes.  Abdominal: Soft. Bowel sounds are normal. She exhibits no distension and no mass. There is no tenderness. There is no rebound and no guarding.  Musculoskeletal: She exhibits no edema and no tenderness.  Lymphadenopathy:    She has no cervical adenopathy.  Neurological: She displays normal reflexes. No cranial nerve deficit. She exhibits normal muscle tone. Coordination normal.  Skin: Skin is warm. No rash noted. No erythema.  Psychiatric: Her behavior is normal. Judgment and thought content normal.  In a dark room due to a HA    Lab Results  Component Value Date   WBC 5.8 03/20/2013   HGB 10.5* 03/20/2013   HCT 31.4* 03/20/2013   PLT 160.0 03/20/2013   GLUCOSE 95 03/20/2013   CHOL 226* 06/15/2010   TRIG 213.0* 06/15/2010   HDL 62.60 06/15/2010   LDLDIRECT 131.9 06/15/2010   LDLCALC 99 05/20/2006   ALT 12 03/20/2013  AST 16 03/20/2013   NA 141 03/20/2013   K 3.6 03/20/2013   CL 110 03/20/2013   CREATININE 1.2 03/20/2013   BUN 14 03/20/2013   CO2 26 03/20/2013   TSH 1.93 03/20/2013   INR 0.9 09/01/2010   HGBA1C 7.2* 03/20/2013   MICROALBUR 0.8 08/16/2011       Assessment & Plan:

## 2013-06-22 ENCOUNTER — Other Ambulatory Visit: Payer: Self-pay | Admitting: Internal Medicine

## 2013-06-22 MED ORDER — CEFUROXIME AXETIL 250 MG PO TABS: 250.0000 mg | ORAL_TABLET | Freq: Two times a day (BID) | ORAL | Status: DC

## 2013-06-24 ENCOUNTER — Other Ambulatory Visit: Payer: Self-pay | Admitting: Internal Medicine

## 2013-06-25 ENCOUNTER — Other Ambulatory Visit: Payer: Self-pay | Admitting: Internal Medicine

## 2013-07-01 ENCOUNTER — Encounter: Payer: Self-pay | Admitting: Endocrinology

## 2013-07-01 ENCOUNTER — Ambulatory Visit (INDEPENDENT_AMBULATORY_CARE_PROVIDER_SITE_OTHER): Payer: Medicare Other | Admitting: Endocrinology

## 2013-07-01 ENCOUNTER — Ambulatory Visit: Payer: MEDICARE | Admitting: Endocrinology

## 2013-07-01 VITALS — BP 114/70 | HR 64 | Temp 98.1°F | Ht 63.0 in | Wt 95.0 lb

## 2013-07-01 DIAGNOSIS — E109 Type 1 diabetes mellitus without complications: Secondary | ICD-10-CM

## 2013-07-01 DIAGNOSIS — E1049 Type 1 diabetes mellitus with other diabetic neurological complication: Secondary | ICD-10-CM

## 2013-07-01 NOTE — Progress Notes (Signed)
Subjective:    Patient ID: Lindsey Gomez, female    DOB: 1945-06-23, 68 y.o.   MRN: [id number]  HPI Pt returns for f/u of insulin-requiring DM (dx'ed 1990, after a 40% pancreatectomy at [facility], for pancreatitis; she has mild neuropathy of the lower extremities; no known associated complications; she had an episode of severe hypoglycemia in September, 2013; she has never had DKA; she takes multiple daily injections).  she brings a record of her cbg's which i have reviewed today.  It is mildly low approx once a week.  It varies from 43-300.  There is no trend throughout the day, except it is most often low in the afternoon.   Pt brings a diabetic shoes form saying "poor circulation, foot deformity, and neuropathy with callus." Past Medical History  Diagnosis Date  . Depression   . Type II or unspecified type diabetes mellitus without mention of complication, not stated as uncontrolled   . HTN (hypertension)   . Chronic pain   . Fibromyalgia   . Migraines   . History of breast cancer     Right  . Chronic pancreatitis   . Vitamin D deficiency   . GERD (gastroesophageal reflux disease)   . Finger dislocation 2009    L 3rd  . COPD (chronic obstructive pulmonary disease)   . Anemia   . Fractured sternum 11/2008  . Osteoporosis     Reclast too expensive  . Barrett's esophagus   . Chronic fatigue   . Diverticulosis   . Opioid dependence     Past Surgical History  Procedure Laterality Date  . Nasal sinus surgery    . Pancreatectomy      40%  . Cholecystectomy    . Appendectomy    . Abdominal hysterectomy      History   Social History  . Marital Status: Married    Spouse Name: [name]    Number of Children: N/A  . Years of Education: N/A   Occupational History  . 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 Smoking status: Current Every Day Smoker -- 2.00 packs/day  . Smokeless tobacco: Never Used  . Alcohol Use: No  . Drug Use: No  . Sexual Activity:  No   Other Topics Concern  . Not on file   Social History Narrative   Did not go back to work Oct 23, [name] is very ill - unable to have financial ends meet, no income      Regular Exercise -  NO      Coffee daily     Current Outpatient Prescriptions on File Prior to Visit  Medication Sig Dispense Refill  . ACCU-CHEK AVIVA PLUS test strip USE TO CHECK BLOOD SUGAR LEVELS 4 TIMES DAILY AS DIRECTED  100 each  5  . ACCU-CHEK SOFTCLIX LANCETS lancets Use as instructed  100 each  3  . amitriptyline (ELAVIL) 50 MG tablet Take 0.5-1 tablets (25-50 mg total) by mouth at bedtime.  60 tablet  11  . atenolol (TENORMIN) 100 MG tablet Take 1 tablet (100 mg total) by mouth every morning.  30 tablet  11  . atorvastatin (LIPITOR) 10 MG tablet Take 1 tablet (10 mg total) by mouth daily.  30 tablet  11  . B-D ULTRAFINE III SHORT PEN 31G X 8 MM MISC USE 5 TIMES A DAY AS DIRECTED  200 each  3  . calcitRIOL (ROCALTROL) 0.5 MCG capsule Take  2 capsules (1 mcg total) by mouth daily.  60 capsule  11  . cefUROXime (CEFTIN) 250 MG tablet Take 1 tablet (250 mg total) by mouth 2 (two) times daily.  20 tablet  0  . diclofenac sodium (VOLTAREN) 1 % GEL Apply topically 4 (four) times daily as needed.        [name] glucagon 1 MG injection Inject 1 mg into the vein once as needed.  1 each  12  . hydrocortisone (CORTEF) 10 MG tablet Take 1 tablet (10 mg total) by mouth 2 (two) times daily.  60 tablet  11  . insulin aspart (NOVOLOG) 100 UNIT/ML FlexPen 10 units with breakfast 4 units with lunch and 5 units with evening meal, and pen needles 4/day      . Ketorolac Tromethamine (SPRIX) 15.75 MG/SPRAY SOLN 1 spray by Nasal route 3 (three) times daily as needed.        . Linaclotide (LINZESS) 290 MCG CAPS capsule Take 1 capsule (290 mcg total) by mouth daily.  30 capsule  11  . LORazepam (ATIVAN) 2 MG tablet TAKE 1 TABLET BY MOUTH EVERY 8 HOURS AS NEEDED FOR ANXIETY  90 tablet  2  . NASONEX 50 MCG/ACT nasal spray INHALE 2 SPRAYS  INTO EACH NOSTRIL ONCE DAILY  17 g  11  . omeprazole (PRILOSEC) 40 MG capsule Take 1 capsule (40 mg total) by mouth 2 (two) times daily.  30 capsule  11  . OxyCODONE (OXYCONTIN) 20 mg T12A 12 hr tablet Take 1 tablet (20 mg total) by mouth every 12 (twelve) hours.  60 tablet  0  . oxyCODONE (ROXICODONE) 15 MG immediate release tablet Take 1 tablet (15 mg total) by mouth every 8 (eight) hours as needed for pain.  90 tablet  0  . prochlorperazine (COMPAZINE) 10 MG tablet Take 1 tablet (10 mg total) by mouth every 8 (eight) hours as needed for nausea or vomiting.  180 tablet  3  . tamoxifen (NOLVADEX) 20 MG tablet Take 20 mg by mouth 2 (two) times daily.        [name] topiramate (TOPAMAX) 200 MG tablet Take 0.5 tablets (100 mg total) by mouth daily. At hs  60 tablet  5  . topiramate (TOPAMAX) 200 MG tablet TAKE 1 TABLET (200 MG TOTAL) BY MOUTH 2 (TWO) TIMES DAILY.  60 tablet  4  . topiramate (TOPAMAX) 200 MG tablet TAKE 1 TABLET (200 MG TOTAL) BY MOUTH 2 (TWO) TIMES DAILY.  60 tablet  4  . triamcinolone (KENALOG) 0.1 % ointment Apply topically 2 (two) times daily as needed.        . Vortioxetine HBr (BRINTELLIX) 20 MG TABS Take 20 mg by mouth daily.  30 tablet  5  . zolmitriptan (ZOMIG) 5 MG nasal solution Place 1 spray into the nose as needed for migraine.  6 Units  5  . zolmitriptan (ZOMIG) 5 MG tablet TAKE 1 TABLET BY MOUTH AS NEEDED MIGRAINE  8 tablet  5   Current Facility-Administered Medications on File Prior to Visit  Medication Dose Route Frequency Provider Last Rate Last Dose  . 0.9 %  sodium chloride infusion  500 mL Intravenous Continuous Inda Castle, MD      . promethazine (PHENERGAN) injection 50 mg  50 mg Intramuscular Q6H PRN Cassandria Anger, MD   50 mg at 03/20/13 1601    Allergies  Allergen Reactions  . Gabapentin     Weak muscles  . Milnacipran  REACTION: HA and hand swelling  . Moxifloxacin   . Rizatriptan Benzoate     REACTION: n/v  . Sulfonamide Derivatives   .  Venlafaxine     Family History  Problem Relation Age of Onset  . Colon cancer Neg Hx   . COPD Father   . Heart disease Father   . Leukemia Mother   . Diabetes Maternal Uncle     BP 114/70  Pulse 64  Temp(Src) 98.1 F (36.7 C) (Oral)  Ht 5\' 3"  (1.6 m)  Wt 95 lb (43.092 kg)  BMI 16.83 kg/m2  SpO2 95%  Review of Systems Denies LOC.  She has lost a few lbs    Objective:   Physical Exam VITAL SIGNS:  See vs page GENERAL: no distress  Lab Results  Component Value Date   HGBA1C 7.1* 06/19/2013      Assessment & Plan:  DM: Needs increased rx, if it can be done with a regimen that avoids or minimizes hypoglycemia. Weight loss: this is probably reducing insulin requirement. Pancreatic insufficiency: in this context, she needs only a low dosage of insulin.

## 2013-07-01 NOTE — Patient Instructions (Addendum)
Please increase the levemir to 10 units at bedtime, and:  Decrease the novolog to 10 units with breakfast, 4 units with lunch and 5 units with evening meal check your blood sugar 4 times a day--before the 3 meals, and at bedtime.  also check if you have symptoms of your blood sugar being too high or too low.  please keep a record of the readings and bring it to your next appointment here.  please call us sooner if your blood sugar goes below 70, or if you have a lot of readings over 200.   Please come back for a follow-up appointment in 3 months.

## 2013-07-02 DIAGNOSIS — E1049 Type 1 diabetes mellitus with other diabetic neurological complication: Secondary | ICD-10-CM | POA: Insufficient documentation

## 2013-07-09 ENCOUNTER — Other Ambulatory Visit: Payer: Self-pay | Admitting: Internal Medicine

## 2013-07-17 ENCOUNTER — Other Ambulatory Visit: Payer: Self-pay | Admitting: Internal Medicine

## 2013-07-17 ENCOUNTER — Encounter: Payer: Self-pay | Admitting: Internal Medicine

## 2013-07-17 ENCOUNTER — Ambulatory Visit (INDEPENDENT_AMBULATORY_CARE_PROVIDER_SITE_OTHER): Payer: Medicare Other | Admitting: Internal Medicine

## 2013-07-17 VITALS — BP 118/68 | HR 80 | Temp 98.9°F | Resp 16 | Wt 95.0 lb

## 2013-07-17 DIAGNOSIS — E109 Type 1 diabetes mellitus without complications: Secondary | ICD-10-CM

## 2013-07-17 DIAGNOSIS — R404 Transient alteration of awareness: Secondary | ICD-10-CM

## 2013-07-17 DIAGNOSIS — E1049 Type 1 diabetes mellitus with other diabetic neurological complication: Secondary | ICD-10-CM

## 2013-07-17 DIAGNOSIS — IMO0001 Reserved for inherently not codable concepts without codable children: Secondary | ICD-10-CM

## 2013-07-17 DIAGNOSIS — J209 Acute bronchitis, unspecified: Secondary | ICD-10-CM

## 2013-07-17 DIAGNOSIS — I10 Essential (primary) hypertension: Secondary | ICD-10-CM

## 2013-07-17 DIAGNOSIS — F1120 Opioid dependence, uncomplicated: Secondary | ICD-10-CM

## 2013-07-17 MED ORDER — AMOXICILLIN 875 MG PO TABS: 875.0000 mg | ORAL_TABLET | Freq: Two times a day (BID) | ORAL | Status: DC

## 2013-07-17 MED ORDER — OXYCODONE HCL ER 20 MG PO T12A: 20.0000 mg | EXTENDED_RELEASE_TABLET | Freq: Two times a day (BID) | ORAL | Status: DC

## 2013-07-17 MED ORDER — OXYCODONE HCL 15 MG PO TABS: 15.0000 mg | ORAL_TABLET | Freq: Three times a day (TID) | ORAL | Status: DC | PRN

## 2013-07-17 MED ORDER — OMEPRAZOLE 40 MG PO CPDR: 40.0000 mg | DELAYED_RELEASE_CAPSULE | Freq: Two times a day (BID) | ORAL | Status: DC

## 2013-07-17 MED ORDER — GLUCOSE BLOOD VI STRP: ORAL_STRIP | Status: DC

## 2013-07-17 MED ORDER — TOPIRAMATE 100 MG PO TABS
100.0000 mg | ORAL_TABLET | Freq: Two times a day (BID) | ORAL | Status: DC
Start: 2013-07-17 — End: 2013-09-10

## 2013-07-17 NOTE — Progress Notes (Signed)
Pre visit review using our clinic review tool, if applicable. No additional management support is needed unless otherwise documented below in the visit note. 

## 2013-07-17 NOTE — Assessment & Plan Note (Signed)
Amoxicillin x10d 

## 2013-07-17 NOTE — Assessment & Plan Note (Signed)
Continue with current prescription therapy as reflected on the Med list.  

## 2013-07-19 ENCOUNTER — Encounter: Payer: Self-pay | Admitting: Internal Medicine

## 2013-07-19 NOTE — Assessment & Plan Note (Signed)
Doing well now 

## 2013-07-19 NOTE — Assessment & Plan Note (Signed)
Continue with current prescription therapy as reflected on the Med list.  

## 2013-07-19 NOTE — Progress Notes (Signed)
Enriqueta is here with her housekeeper [name]  Subjective:   Doing worse again!  Migraine  This is a chronic problem. The current episode started more than 1 year ago. The problem occurs daily. The problem has been waxing and waning (may be better a little). The pain is located in the bilateral region. The pain radiates to the right neck and left neck. The pain quality is similar to prior headaches. The quality of the pain is described as shooting. The pain is at a severity of 7/10. The pain is severe. Associated symptoms include muscle aches, nausea and vomiting. The symptoms are aggravated by fatigue. Her past medical history is significant for cancer and migraine headaches.   Aarini had a cataract surgery. HA is worse  Raedyn denies OSA [name] severe  Migraines - better. [name] on severe pain - neck, back.  The patient presents for a follow-up of chronic HAs, depression, anxiety, hypertension, chronic dyslipidemia, type 1 diabetes.  Problems with [name] at home; stress. [name] more anxiety; depressed - [facility] is helping at home. Nishtha has a housekeeper now [name] Readings from Last 3 Encounters:  07/17/13 95 lb (43.092 kg)  07/01/13 95 lb (43.092 kg)  06/19/13 98 lb 8 oz (44.679 kg)   BP Readings from Last 3 Encounters:  07/17/13 118/68  07/01/13 114/70  06/19/13 160/100      Review of Systems  Constitutional: Positive for fatigue. Negative for chills, activity change, appetite change and unexpected weight change.  HENT: Negative for congestion and mouth sores.   Eyes: Negative for visual disturbance.  Respiratory: Negative for chest tightness.   Gastrointestinal: Positive for nausea and vomiting.  Genitourinary: Negative for frequency, difficulty urinating and vaginal pain.  Musculoskeletal: Positive for arthralgias and gait problem.  Skin: Negative for pallor and rash.  Neurological: Negative for tremors.  Psychiatric/Behavioral: Negative for suicidal ideas, confusion, sleep  disturbance and self-injury. The patient is nervous/anxious.        Objective:   Physical Exam  Constitutional: She appears well-developed and well-nourished. No distress.  HENT:  Head: Normocephalic.  Right Ear: External ear normal.  Left Ear: External ear normal.  Nose: Nose normal.  Mouth/Throat: Oropharynx is clear and moist.  Eyes: Conjunctivae are normal. Pupils are equal, round, and reactive to light. Right eye exhibits no discharge. Left eye exhibits no discharge.  Neck: Normal range of motion. Neck supple. No JVD present. No tracheal deviation present. No thyromegaly present.  Cardiovascular: Normal rate, regular rhythm and normal heart sounds.   Pulmonary/Chest: No stridor. No respiratory distress. She has no wheezes.  Abdominal: Soft. Bowel sounds are normal. She exhibits no distension and no mass. There is no tenderness. There is no rebound and no guarding.  Musculoskeletal: She exhibits no edema and no tenderness.  Lymphadenopathy:    She has no cervical adenopathy.  Neurological: She displays normal reflexes. No cranial nerve deficit. She exhibits normal muscle tone. Coordination normal.  Skin: Skin is warm. No rash noted. No erythema.  Psychiatric: Her behavior is normal. Judgment and thought content normal.  In a dark room due to a HA    Lab Results  Component Value Date   WBC 5.8 06/19/2013   HGB 10.2* 06/19/2013   HCT 30.7* 06/19/2013   PLT 152.0 06/19/2013   GLUCOSE 141* 06/19/2013   CHOL 226* 06/15/2010   TRIG 213.0* 06/15/2010   HDL 62.60 06/15/2010   LDLDIRECT 131.9 06/15/2010   LDLCALC 99 05/20/2006   ALT 16 06/19/2013  AST 18 06/19/2013   NA 138 06/19/2013   K 3.7 06/19/2013   CL 104 06/19/2013   CREATININE 1.7* 06/19/2013   BUN 23 06/19/2013   CO2 26 06/19/2013   TSH 1.93 03/20/2013   INR 0.9 09/01/2010   HGBA1C 7.1* 06/19/2013   MICROALBUR 0.8 08/16/2011       Assessment & Plan:

## 2013-07-19 NOTE — Assessment & Plan Note (Signed)
Discussed.

## 2013-07-20 ENCOUNTER — Telehealth: Payer: Self-pay | Admitting: *Deleted

## 2013-07-20 MED ORDER — LORAZEPAM 2 MG PO TABS: ORAL_TABLET | ORAL | Status: DC

## 2013-07-20 NOTE — Telephone Encounter (Signed)
Pt called requesting Lorazepam refill.  Please advise

## 2013-07-20 NOTE — Telephone Encounter (Signed)
OK to fill this prescription with additional refills x3 Thank you!  

## 2013-07-20 NOTE — Telephone Encounter (Signed)
Spoke with pts husband advised rx ready

## 2013-07-21 ENCOUNTER — Ambulatory Visit: Payer: Medicare Other | Admitting: Internal Medicine

## 2013-07-28 ENCOUNTER — Ambulatory Visit: Payer: Medicare Other | Admitting: Internal Medicine

## 2013-08-14 ENCOUNTER — Ambulatory Visit (INDEPENDENT_AMBULATORY_CARE_PROVIDER_SITE_OTHER)
Admission: RE | Admit: 2013-08-14 | Discharge: 2013-08-14 | Disposition: A | Discharge status: Home / Self Care | Payer: Medicare Other | Source: Ambulatory Visit | Attending: Internal Medicine | Admitting: Internal Medicine

## 2013-08-14 ENCOUNTER — Other Ambulatory Visit (INDEPENDENT_AMBULATORY_CARE_PROVIDER_SITE_OTHER): Payer: Medicare Other

## 2013-08-14 ENCOUNTER — Ambulatory Visit (INDEPENDENT_AMBULATORY_CARE_PROVIDER_SITE_OTHER): Payer: Medicare Other | Admitting: Internal Medicine

## 2013-08-14 ENCOUNTER — Encounter: Payer: Self-pay | Admitting: Internal Medicine

## 2013-08-14 VITALS — BP 138/82 | HR 69 | Temp 98.0°F | Ht 63.0 in | Wt 94.1 lb

## 2013-08-14 DIAGNOSIS — R059 Cough, unspecified: Secondary | ICD-10-CM

## 2013-08-14 DIAGNOSIS — G43819 Other migraine, intractable, without status migrainosus: Secondary | ICD-10-CM

## 2013-08-14 DIAGNOSIS — F29 Unspecified psychosis not due to a substance or known physiological condition: Secondary | ICD-10-CM

## 2013-08-14 DIAGNOSIS — N390 Urinary tract infection, site not specified: Secondary | ICD-10-CM

## 2013-08-14 DIAGNOSIS — R05 Cough: Secondary | ICD-10-CM

## 2013-08-14 DIAGNOSIS — G43119 Migraine with aura, intractable, without status migrainosus: Secondary | ICD-10-CM

## 2013-08-14 DIAGNOSIS — F1120 Opioid dependence, uncomplicated: Secondary | ICD-10-CM

## 2013-08-14 DIAGNOSIS — J209 Acute bronchitis, unspecified: Secondary | ICD-10-CM

## 2013-08-14 DIAGNOSIS — F17200 Nicotine dependence, unspecified, uncomplicated: Secondary | ICD-10-CM

## 2013-08-14 DIAGNOSIS — IMO0001 Reserved for inherently not codable concepts without codable children: Secondary | ICD-10-CM

## 2013-08-14 DIAGNOSIS — Z9181 History of falling: Secondary | ICD-10-CM

## 2013-08-14 DIAGNOSIS — R296 Repeated falls: Secondary | ICD-10-CM

## 2013-08-14 DIAGNOSIS — E109 Type 1 diabetes mellitus without complications: Secondary | ICD-10-CM

## 2013-08-14 LAB — URINALYSIS, ROUTINE W REFLEX MICROSCOPIC
BILIRUBIN URINE: NEGATIVE
Hgb urine dipstick: NEGATIVE
KETONES UR: NEGATIVE
Nitrite: NEGATIVE
PH: 7 (ref 5.0–8.0)
RBC / HPF: NONE SEEN (ref 0–?)
Specific Gravity, Urine: 1.01 (ref 1.000–1.030)
TOTAL PROTEIN, URINE-UPE24: NEGATIVE
Urine Glucose: NEGATIVE
Urobilinogen, UA: 0.2 (ref 0.0–1.0)

## 2013-08-14 MED ORDER — OXYCODONE HCL 10 MG PO TABS: ORAL_TABLET | ORAL | Status: DC

## 2013-08-14 MED ORDER — VORTIOXETINE HBR 20 MG PO TABS: 20.0000 mg | ORAL_TABLET | Freq: Every day | ORAL | Status: DC

## 2013-08-14 MED ORDER — OXYCODONE HCL ER 20 MG PO T12A: 20.0000 mg | EXTENDED_RELEASE_TABLET | Freq: Two times a day (BID) | ORAL | Status: DC

## 2013-08-14 NOTE — Assessment & Plan Note (Signed)
Worse. A complex case No driving! Will reduce Oxycodone to 10 mg po tid prn RTC 1 mo

## 2013-08-14 NOTE — Assessment & Plan Note (Signed)
Refractory  6/15 10 cigs/d Discussed

## 2013-08-14 NOTE — Assessment & Plan Note (Signed)
Continue with current prescription therapy as reflected on the Med list.  

## 2013-08-14 NOTE — Assessment & Plan Note (Signed)
Discussed.

## 2013-08-14 NOTE — Progress Notes (Signed)
Sergio is here with her housekeeper [name]  Subjective:   Doing worse again! Falling a lot per Lana lately. C/o cough - not better after abx  Migraine  This is a chronic problem. The current episode started more than 1 year ago. The problem occurs daily. The problem has been waxing and waning (may be better a little). The pain is located in the bilateral region. The pain radiates to the right neck and left neck. The pain quality is similar to prior headaches. The quality of the pain is described as shooting. The pain is at a severity of 7/10. The pain is severe. Associated symptoms include muscle aches, nausea and vomiting. The symptoms are aggravated by fatigue. Her past medical history is significant for cancer and migraine headaches.   Carylon had a cataract surgery.   Jadynn denies OSA [name] severe  Migraines - better. [name] on severe pain - neck, back.  The patient presents for a follow-up of chronic HAs, depression, anxiety, hypertension, chronic dyslipidemia, type 1 diabetes.   [name] is better, still stressed. [name] more anxiety; depressed - [facility] is helping at home. Knox has a housekeeper now [name] Readings from Last 3 Encounters:  08/14/13 94 lb 1.9 oz (42.693 kg)  07/17/13 95 lb (43.092 kg)  07/01/13 95 lb (43.092 kg)   BP Readings from Last 3 Encounters:  08/14/13 138/82  07/17/13 118/68  07/01/13 114/70      Review of Systems  Constitutional: Positive for fatigue. Negative for chills, activity change, appetite change and unexpected weight change.  HENT: Negative for congestion and mouth sores.   Eyes: Negative for visual disturbance.  Respiratory: Negative for chest tightness.   Gastrointestinal: Positive for nausea and vomiting.  Genitourinary: Negative for frequency, difficulty urinating and vaginal pain.  Musculoskeletal: Positive for arthralgias and gait problem.  Skin: Negative for pallor and rash.  Neurological: Negative for tremors.   Psychiatric/Behavioral: Negative for suicidal ideas, confusion, sleep disturbance and self-injury. The patient is nervous/anxious.        Objective:   Physical Exam  Constitutional: She appears well-developed. No distress.  thin  HENT:  Head: Normocephalic.  Right Ear: External ear normal.  Left Ear: External ear normal.  Nose: Nose normal.  Mouth/Throat: Oropharynx is clear and moist.  Eyes: Conjunctivae are normal. Pupils are equal, round, and reactive to light. Right eye exhibits no discharge. Left eye exhibits no discharge.  Neck: Normal range of motion. Neck supple. No JVD present. No tracheal deviation present. No thyromegaly present.  Cardiovascular: Normal rate, regular rhythm and normal heart sounds.   Pulmonary/Chest: No stridor. No respiratory distress. She has no wheezes.  Abdominal: Soft. Bowel sounds are normal. She exhibits no distension and no mass. There is no tenderness. There is no rebound and no guarding.  Musculoskeletal: She exhibits no edema and no tenderness.  Lymphadenopathy:    She has no cervical adenopathy.  Neurological: She displays normal reflexes. No cranial nerve deficit. She exhibits normal muscle tone. Coordination abnormal.  Ataxic Slow speech Flat affect  Skin: Skin is warm. No rash noted. No erythema.  Psychiatric: Her behavior is normal. Judgment normal.    Lab Results  Component Value Date   WBC 5.8 06/19/2013   HGB 10.2* 06/19/2013   HCT 30.7* 06/19/2013   PLT 152.0 06/19/2013   GLUCOSE 141* 06/19/2013   CHOL 226* 06/15/2010   TRIG 213.0* 06/15/2010   HDL 62.60 06/15/2010   LDLDIRECT 131.9 06/15/2010   LDLCALC 99 05/20/2006  ALT 16 06/19/2013   AST 18 06/19/2013   NA 138 06/19/2013   K 3.7 06/19/2013   CL 104 06/19/2013   CREATININE 1.7* 06/19/2013   BUN 23 06/19/2013   CO2 26 06/19/2013   TSH 1.93 03/20/2013   INR 0.9 09/01/2010   HGBA1C 7.1* 06/19/2013   MICROALBUR 0.8 08/16/2011       Assessment & Plan:

## 2013-08-14 NOTE — Assessment & Plan Note (Addendum)
Sporadic No driving High risk for fall Labs, CXR

## 2013-08-14 NOTE — Assessment & Plan Note (Signed)
FTT Discussed Lindsey Gomez is denying having problems... I tried to call Lindsey Gomez's dtr a few times before - unable to reach her

## 2013-08-15 MED ORDER — AMOXICILLIN-POT CLAVULANATE 875-125 MG PO TABS: 1.0000 | ORAL_TABLET | Freq: Two times a day (BID) | ORAL | Status: DC

## 2013-08-15 NOTE — Assessment & Plan Note (Signed)
Continue with current prescription therapy as reflected on the Med list.  

## 2013-08-15 NOTE — Assessment & Plan Note (Signed)
Refractory CXR 

## 2013-08-17 LAB — CULTURE, URINE COMPREHENSIVE: Colony Count: 3000

## 2013-08-19 ENCOUNTER — Telehealth: Payer: Self-pay | Admitting: Internal Medicine

## 2013-08-19 NOTE — Telephone Encounter (Signed)
I can't initiate admission. Lindsey Gomez or a family member should take her to ER or call 911 Thx

## 2013-08-19 NOTE — Telephone Encounter (Signed)
Lindsey Gomez, daughter, called to ask that Lindsey Gomez be hospitalized for treatment of the pneumonia and UTI.  Please advise.

## 2013-08-20 NOTE — Telephone Encounter (Signed)
Daughter informed.  States she is taking Augmentin.  Pharmacy told them that this will not treat the UTI.  Please advise.

## 2013-08-21 NOTE — Telephone Encounter (Signed)
Augmentin should work for both. Unable to use an "ideal abx for both" due to allergy. Take pt to ER if not better or if worse Thx

## 2013-08-21 NOTE — Telephone Encounter (Signed)
Left detailed mess informing Ivin Booty of MD advisement.

## 2013-08-26 ENCOUNTER — Telehealth: Payer: Self-pay

## 2013-08-26 NOTE — Telephone Encounter (Signed)
Please clarify Thx

## 2013-08-26 NOTE — Telephone Encounter (Signed)
Pt states that her insurance wants her lorazepam rx to be changed from 1 tab q8h to 1 1/2 tab po daily.  Pt states that she is out of lorazepam.  Please send rx to [facility]

## 2013-08-28 ENCOUNTER — Other Ambulatory Visit: Payer: Self-pay | Admitting: Internal Medicine

## 2013-08-28 MED ORDER — LORAZEPAM 2 MG PO TABS: ORAL_TABLET | ORAL | Status: DC

## 2013-08-28 MED ORDER — DOXYCYCLINE HYCLATE 100 MG PO TABS: 100.0000 mg | ORAL_TABLET | Freq: Two times a day (BID) | ORAL | Status: DC

## 2013-08-31 ENCOUNTER — Telehealth: Payer: Self-pay | Admitting: *Deleted

## 2013-08-31 NOTE — Telephone Encounter (Signed)
Left MSG on triage stating md md gave new rx for doxcycline on Friday for pneumonia. Wanting to know should she also stop taking the other antibiotic md rx the pharmacist told her she should but wanting to verify with md. Called pt back inform her yes to stop taking the augmentin and take the doxcycline that was rx on Friday...Johny Chess

## 2013-09-07 ENCOUNTER — Telehealth: Payer: Self-pay | Admitting: Endocrinology

## 2013-09-07 NOTE — Telephone Encounter (Signed)
Could you review the call a nurse report that was done over the weekend and please advise. Report placed on desk.  Thanks!

## 2013-09-07 NOTE — Telephone Encounter (Signed)
Please reduce levemir to 8 units qhs

## 2013-09-07 NOTE — Telephone Encounter (Signed)
Patient wanted to directly speak with Dr. Loanne Drilling I advised her that she could speak with the doctors nurse Collier   Please call patient   Call back [phone]  Thank You

## 2013-09-08 NOTE — Telephone Encounter (Signed)
Noted, pt advised

## 2013-09-09 ENCOUNTER — Telehealth: Payer: Self-pay | Admitting: *Deleted

## 2013-09-09 DIAGNOSIS — E109 Type 1 diabetes mellitus without complications: Secondary | ICD-10-CM

## 2013-09-09 NOTE — Telephone Encounter (Signed)
Spoke with husband. He and the patient will come for labs. Lipid panel ordered.

## 2013-09-10 ENCOUNTER — Ambulatory Visit: Payer: Medicare Other | Admitting: Internal Medicine

## 2013-09-10 ENCOUNTER — Ambulatory Visit (INDEPENDENT_AMBULATORY_CARE_PROVIDER_SITE_OTHER): Payer: Medicare Other | Admitting: Internal Medicine

## 2013-09-10 ENCOUNTER — Encounter: Payer: Self-pay | Admitting: Internal Medicine

## 2013-09-10 ENCOUNTER — Other Ambulatory Visit (INDEPENDENT_AMBULATORY_CARE_PROVIDER_SITE_OTHER): Payer: Medicare Other

## 2013-09-10 VITALS — BP 108/70 | HR 76 | Temp 98.5°F | Resp 16 | Wt 91.0 lb

## 2013-09-10 DIAGNOSIS — IMO0001 Reserved for inherently not codable concepts without codable children: Secondary | ICD-10-CM

## 2013-09-10 DIAGNOSIS — F411 Generalized anxiety disorder: Secondary | ICD-10-CM

## 2013-09-10 DIAGNOSIS — F19988 Other psychoactive substance use, unspecified with other psychoactive substance-induced disorder: Secondary | ICD-10-CM

## 2013-09-10 DIAGNOSIS — E109 Type 1 diabetes mellitus without complications: Secondary | ICD-10-CM

## 2013-09-10 DIAGNOSIS — F11288 Opioid dependence with other opioid-induced disorder: Secondary | ICD-10-CM

## 2013-09-10 DIAGNOSIS — R296 Repeated falls: Secondary | ICD-10-CM

## 2013-09-10 DIAGNOSIS — F1120 Opioid dependence, uncomplicated: Secondary | ICD-10-CM

## 2013-09-10 DIAGNOSIS — Z9181 History of falling: Secondary | ICD-10-CM

## 2013-09-10 DIAGNOSIS — I10 Essential (primary) hypertension: Secondary | ICD-10-CM

## 2013-09-10 DIAGNOSIS — J209 Acute bronchitis, unspecified: Secondary | ICD-10-CM

## 2013-09-10 DIAGNOSIS — G43119 Migraine with aura, intractable, without status migrainosus: Secondary | ICD-10-CM

## 2013-09-10 DIAGNOSIS — F17200 Nicotine dependence, unspecified, uncomplicated: Secondary | ICD-10-CM

## 2013-09-10 DIAGNOSIS — R112 Nausea with vomiting, unspecified: Secondary | ICD-10-CM | POA: Insufficient documentation

## 2013-09-10 DIAGNOSIS — N390 Urinary tract infection, site not specified: Secondary | ICD-10-CM | POA: Insufficient documentation

## 2013-09-10 DIAGNOSIS — G43819 Other migraine, intractable, without status migrainosus: Secondary | ICD-10-CM

## 2013-09-10 LAB — BASIC METABOLIC PANEL
BUN: 36 mg/dL — ABNORMAL HIGH (ref 6–23)
CHLORIDE: 109 meq/L (ref 96–112)
CO2: 24 mEq/L (ref 19–32)
CREATININE: 2 mg/dL — AB (ref 0.4–1.2)
Calcium: 10.4 mg/dL (ref 8.4–10.5)
GFR: 27.09 mL/min — ABNORMAL LOW (ref >60.00)
Glucose, Bld: 88 mg/dL (ref 70–99)
POTASSIUM: 3.4 meq/L — AB (ref 3.5–5.1)
Sodium: 140 mEq/L (ref 135–145)

## 2013-09-10 LAB — CBC WITH DIFFERENTIAL/PLATELET
BASOS ABS: 0.1 10*3/uL (ref 0.0–0.1)
Basophils Relative: 0.7 % (ref 0.0–3.0)
Eosinophils Absolute: 0.2 10*3/uL (ref 0.0–0.7)
Eosinophils Relative: 2.1 % (ref 0.0–5.0)
HEMATOCRIT: 35.7 % — AB (ref 36.0–46.0)
HEMOGLOBIN: 12 g/dL (ref 12.0–15.0)
LYMPHS PCT: 30.6 % (ref 12.0–46.0)
Lymphs Abs: 2.8 10*3/uL (ref 0.7–4.0)
MCHC: 33.6 g/dL (ref 30.0–36.0)
MCV: 89.8 fl (ref 78.0–100.0)
MONOS PCT: 6.7 % (ref 3.0–12.0)
Monocytes Absolute: 0.6 10*3/uL (ref 0.1–1.0)
NEUTROS PCT: 59.9 % (ref 43.0–77.0)
Neutro Abs: 5.4 10*3/uL (ref 1.4–7.7)
PLATELETS: 184 10*3/uL (ref 150.0–400.0)
RBC: 3.98 Mil/uL (ref 3.87–5.11)
RDW: 16.5 % — ABNORMAL HIGH (ref 11.5–15.5)
WBC: 9 10*3/uL (ref 4.0–10.5)

## 2013-09-10 LAB — LIPID PANEL
CHOL/HDL RATIO: 2
Cholesterol: 123 mg/dL (ref 0–200)
HDL: 72.8 mg/dL (ref >39.00)
LDL Cholesterol: 34 mg/dL (ref 0–99)
NONHDL: 50.2
Triglycerides: 82 mg/dL (ref 0.0–149.0)
VLDL: 16.4 mg/dL (ref 0.0–40.0)

## 2013-09-10 LAB — HEPATIC FUNCTION PANEL
ALK PHOS: 121 U/L — AB (ref 39–117)
ALT: 21 U/L (ref 0–35)
AST: 29 U/L (ref 0–37)
Albumin: 3.5 g/dL (ref 3.5–5.2)
BILIRUBIN TOTAL: 0.4 mg/dL (ref 0.2–1.2)
Bilirubin, Direct: 0.1 mg/dL (ref 0.0–0.3)
Total Protein: 6.8 g/dL (ref 6.0–8.3)

## 2013-09-10 LAB — URINALYSIS, ROUTINE W REFLEX MICROSCOPIC
Bilirubin Urine: NEGATIVE
Hgb urine dipstick: NEGATIVE
KETONES UR: NEGATIVE
Nitrite: NEGATIVE
PH: 6 (ref 5.0–8.0)
SPECIFIC GRAVITY, URINE: 1.015 (ref 1.000–1.030)
Total Protein, Urine: NEGATIVE
URINE GLUCOSE: NEGATIVE
Urobilinogen, UA: 0.2 (ref 0.0–1.0)

## 2013-09-10 LAB — LIPASE: Lipase: 76 U/L — ABNORMAL HIGH (ref 11.0–59.0)

## 2013-09-10 MED ORDER — TOPIRAMATE 100 MG PO TABS: 50.0000 mg | ORAL_TABLET | Freq: Two times a day (BID) | ORAL | Status: DC

## 2013-09-10 MED ORDER — OXYCODONE HCL ER 10 MG PO T12A: 10.0000 mg | EXTENDED_RELEASE_TABLET | Freq: Three times a day (TID) | ORAL | Status: DC

## 2013-09-10 MED ORDER — OXYCODONE HCL 10 MG PO TABS: ORAL_TABLET | ORAL | Status: DC

## 2013-09-10 NOTE — Assessment & Plan Note (Signed)
See Rx 

## 2013-09-10 NOTE — Progress Notes (Signed)
Lindsey Gomez is here with her housekeeper Lindsey Gomez  Subjective:   Doing ok! Falling less Lana lately. [name] cough -  better after abx C/o n/v; lost wt  Migraine  This is a chronic problem. The current episode started more than 1 year ago. The problem occurs daily. The problem has been waxing and waning (may be better a little). The pain is located in the bilateral region. The pain radiates to the right neck and left neck. The pain quality is similar to prior headaches. The quality of the pain is described as shooting. The pain is at a severity of 7/10. The pain is severe. Associated symptoms include muscle aches, nausea and vomiting. The symptoms are aggravated by fatigue. Her past medical history is significant for cancer and migraine headaches.     Lindsey Gomez denies OSA [name] severe migraines - better. [name] on severe pain - neck, back.  The patient presents for a follow-up of chronic HAs, depression, anxiety, hypertension, chronic dyslipidemia, type 1 diabetes.   Lindsey Gomez is better, still stressed. [name] more anxiety; depressed - [facility] is helping at home. Lindsey Gomez has a housekeeper now [name] Readings from Last 3 Encounters:  09/10/13 91 lb (41.277 kg)  08/14/13 94 lb 1.9 oz (42.693 kg)  07/17/13 95 lb (43.092 kg)   BP Readings from Last 3 Encounters:  09/10/13 108/70  08/14/13 138/82  07/17/13 118/68      Review of Systems  Constitutional: Positive for fatigue. Negative for chills, activity change, appetite change and unexpected weight change.  HENT: Negative for congestion and mouth sores.   Eyes: Negative for visual disturbance.  Respiratory: Negative for chest tightness.   Gastrointestinal: Positive for nausea and vomiting.  Genitourinary: Negative for frequency, difficulty urinating and vaginal pain.  Musculoskeletal: Positive for arthralgias and gait problem.  Skin: Negative for pallor and rash.  Neurological: Negative for tremors.  Psychiatric/Behavioral: Negative for suicidal  ideas, confusion, sleep disturbance and self-injury. The patient is nervous/anxious.        Objective:   Physical Exam  Constitutional: She appears well-developed. No distress.  thin  HENT:  Head: Normocephalic.  Right Ear: External ear normal.  Left Ear: External ear normal.  Nose: Nose normal.  Mouth/Throat: Oropharynx is clear and moist.  Eyes: Conjunctivae are normal. Pupils are equal, round, and reactive to light. Right eye exhibits no discharge. Left eye exhibits no discharge.  Neck: Normal range of motion. Neck supple. No JVD present. No tracheal deviation present. No thyromegaly present.  Cardiovascular: Normal rate, regular rhythm and normal heart sounds.   Pulmonary/Chest: No stridor. No respiratory distress. She has no wheezes.  Abdominal: Soft. Bowel sounds are normal. She exhibits no distension and no mass. There is no tenderness. There is no rebound and no guarding.  Musculoskeletal: She exhibits no edema and no tenderness.  Lymphadenopathy:    She has no cervical adenopathy.  Neurological: She displays normal reflexes. No cranial nerve deficit. She exhibits normal muscle tone. Coordination abnormal.  Ataxic Slow speech Flat affect  Skin: Skin is warm. No rash noted. No erythema.  Psychiatric: Her behavior is normal. Judgment normal.    Lab Results  Component Value Date   WBC 5.8 06/19/2013   HGB 10.2* 06/19/2013   HCT 30.7* 06/19/2013   PLT 152.0 06/19/2013   GLUCOSE 141* 06/19/2013   CHOL 226* 06/15/2010   TRIG 213.0* 06/15/2010   HDL 62.60 06/15/2010   LDLDIRECT 131.9 06/15/2010   LDLCALC 99 05/20/2006   ALT 16 06/19/2013  AST 18 06/19/2013   NA 138 06/19/2013   K 3.7 06/19/2013   CL 104 06/19/2013   CREATININE 1.7* 06/19/2013   BUN 23 06/19/2013   CO2 26 06/19/2013   TSH 1.93 03/20/2013   INR 0.9 09/01/2010   HGBA1C 7.1* 06/19/2013   MICROALBUR 0.8 08/16/2011      a complex case Assessment & Plan:

## 2013-09-10 NOTE — Patient Instructions (Signed)
Wt Readings from Last 3 Encounters:  09/10/13 91 lb (41.277 kg)  08/14/13 94 lb 1.9 oz (42.693 kg)  07/17/13 95 lb (43.092 kg)

## 2013-09-10 NOTE — Assessment & Plan Note (Signed)
Continue with current prescription therapy as reflected on the Med list.  

## 2013-09-10 NOTE — Assessment & Plan Note (Signed)
Multifactorial: manual farm work (horse), meds side effects, debility, chronic ataxia - it is a difficult situation

## 2013-09-10 NOTE — Assessment & Plan Note (Signed)
Resolved on abx 

## 2013-09-10 NOTE — Assessment & Plan Note (Signed)
We are trying to reduce meds

## 2013-09-10 NOTE — Assessment & Plan Note (Signed)
7/15 worse Recurrent due to gastroparesis, meds, migraines Reduce Topamax Labs

## 2013-09-10 NOTE — Assessment & Plan Note (Signed)
Discussed.

## 2013-09-10 NOTE — Assessment & Plan Note (Signed)
Will reduce Oxycontin to 10 mg tid On Topamax - may need to reduce

## 2013-09-10 NOTE — Progress Notes (Deleted)
Pre visit review using our clinic review tool, if applicable. No additional management support is needed unless otherwise documented below in the visit note. 

## 2013-09-28 ENCOUNTER — Telehealth: Payer: Self-pay | Admitting: Internal Medicine

## 2013-09-28 NOTE — Telephone Encounter (Signed)
I called, spoke w/Sharon

## 2013-09-28 NOTE — Telephone Encounter (Signed)
Patients daughter would like a call in regards to her mother

## 2013-09-30 ENCOUNTER — Encounter: Payer: Self-pay | Admitting: Endocrinology

## 2013-09-30 ENCOUNTER — Ambulatory Visit: Payer: Medicare Other | Admitting: Endocrinology

## 2013-09-30 ENCOUNTER — Ambulatory Visit (INDEPENDENT_AMBULATORY_CARE_PROVIDER_SITE_OTHER): Payer: Medicare Other | Admitting: Endocrinology

## 2013-09-30 VITALS — BP 122/80 | HR 66 | Temp 97.9°F | Ht 63.0 in | Wt 91.0 lb

## 2013-09-30 DIAGNOSIS — E1049 Type 1 diabetes mellitus with other diabetic neurological complication: Secondary | ICD-10-CM

## 2013-09-30 LAB — HEMOGLOBIN A1C: HEMOGLOBIN A1C: 7 % — AB (ref 4.6–6.5)

## 2013-09-30 NOTE — Progress Notes (Signed)
Subjective:    Patient ID: Lindsey Gomez, female    DOB: 1945-03-19, 68 y.o.   MRN: [id number]  HPI Pt returns for f/u of insulin-requiring DM (dx'ed 1990, after a 40% pancreatectomy at [facility], for pancreatitis; she has mild neuropathy of the lower extremities; no known associated complications; she had an episode of severe hypoglycemia in September, 2013; she has never had DKA; she takes multiple daily injections).  she brings a record of her cbg's which i have reviewed today.  It varies from 67-400, but most are in the 100's.  Family says she is becoming more frail.   Past Medical History  Diagnosis Date  . Depression   . Type II or unspecified type diabetes mellitus without mention of complication, not stated as uncontrolled   . HTN (hypertension)   . Chronic pain   . Fibromyalgia   . Migraines   . History of breast cancer     Right  . Chronic pancreatitis   . Vitamin D deficiency   . GERD (gastroesophageal reflux disease)   . Finger dislocation 2009    L 3rd  . COPD (chronic obstructive pulmonary disease)   . Anemia   . Fractured sternum 11/2008  . Osteoporosis     Reclast too expensive  . Barrett's esophagus   . Chronic fatigue   . Diverticulosis   . Opioid dependence     Past Surgical History  Procedure Laterality Date  . Nasal sinus surgery    . Pancreatectomy      40%  . Cholecystectomy    . Appendectomy    . Abdominal hysterectomy      History   Social History  . Marital Status: Married    Spouse Name: [name]    Number of Children: N/A  . Years of Education: N/A   Occupational History  . 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 Smoking status: Current Every Day Smoker -- 2.00 packs/day  . Smokeless tobacco: Never Used  . Alcohol Use: No  . Drug Use: No  . Sexual Activity: No   Other Topics Concern  . Not on file   Social History Narrative   Did not go back to work Oct 23, [name] is very ill - unable to have financial ends  meet, no income      Regular Exercise -  NO      Coffee daily     No current facility-administered medications on file prior to visit.   Current Outpatient Prescriptions on File Prior to Visit  Medication Sig Dispense Refill  . atenolol (TENORMIN) 100 MG tablet Take 1 tablet (100 mg total) by mouth every morning.  30 tablet  11  . atorvastatin (LIPITOR) 10 MG tablet Take 1 tablet (10 mg total) by mouth daily.  30 tablet  11  . calcitRIOL (ROCALTROL) 0.5 MCG capsule Take 2 capsules (1 mcg total) by mouth daily.  60 capsule  11  . glucagon 1 MG injection Inject 1 mg into the vein once as needed.  1 each  12  . hydrocortisone (CORTEF) 10 MG tablet Take 1 tablet (10 mg total) by mouth 2 (two) times daily.  60 tablet  11  . insulin aspart (NOVOLOG) 100 UNIT/ML FlexPen 10 units with breakfast 4 units with lunch and 5 units with evening meal, and pen needles 4/day      . insulin detemir (LEVEMIR) 100 UNIT/ML injection Inject 8 Units into  the skin at bedtime.       [name] NASONEX 50 MCG/ACT nasal spray INHALE 2 SPRAYS INTO EACH NOSTRIL ONCE DAILY  17 g  11  . omeprazole (PRILOSEC) 40 MG capsule Take 1 capsule (40 mg total) by mouth 2 (two) times daily.  60 capsule  11  . OxyCODONE (OXYCONTIN) 10 mg T12A 12 hr tablet Take 1 tablet (10 mg total) by mouth 3 (three) times daily.  90 tablet  0  . tamoxifen (NOLVADEX) 20 MG tablet Take 20 mg by mouth 2 (two) times daily.        [name] topiramate (TOPAMAX) 100 MG tablet Take 0.5 tablets (50 mg total) by mouth 2 (two) times daily.  60 tablet  11  . triamcinolone (KENALOG) 0.1 % ointment Apply 1 application topically 2 (two) times daily as needed (itching).       . Vortioxetine HBr (BRINTELLIX) 20 MG TABS Take 20 mg by mouth daily.  30 tablet  5    Allergies  Allergen Reactions  . Gabapentin     Weak muscles  . Milnacipran     REACTION: HA and hand swelling  . Moxifloxacin   . Rizatriptan Benzoate     REACTION: n/v  . Sulfonamide Derivatives   .  Venlafaxine     Family History  Problem Relation Age of Onset  . Colon cancer Neg Hx   . COPD Father   . Heart disease Father   . Leukemia Mother   . Diabetes Maternal Uncle    BP 122/80  Pulse 66  Temp(Src) 97.9 F (36.6 C) (Oral)  Ht 5\' 3"  (1.6 m)  Wt 91 lb (41.277 kg)  BMI 16.12 kg/m2  SpO2 99%  Review of Systems She denies LOC.  She has lost weight.      Objective:   Physical Exam VITAL SIGNS:  See vs page GENERAL: no distress Pulses: dorsalis pedis intact bilat.  Feet: no deformity. no ulcer on the feet. feet are of normal color and temp. no edema. There is bilateral onychomycosis, varicosities, and ingrown toenails. There is a heavy callus at the plantar aspect of the right heel.  Neuro: sensation is intact to touch on the feet.     Lab Results  Component Value Date   HGBA1C 7.1* 06/19/2013      Assessment & Plan:  DM: mild exacerbation.   Side-effect of treatment: mild hypoglycemia.   Frail state, worse.     Patient is advised the following:   Patient Instructions  Blood and urine tests are being requested for you today.  We'll contact you with results. check your blood sugar 4 times a day--before the 3 meals, and at bedtime.  also check if you have symptoms of your blood sugar being too high or too low.  please keep a record of the readings and bring it to your next appointment here.  please call us sooner if your blood sugar goes below 70, or if you have a lot of readings over 200.   Please come back for a follow-up appointment in 3 months.

## 2013-09-30 NOTE — Patient Instructions (Addendum)
Blood and urine tests are being requested for you today.  We'll contact you with results. check your blood sugar 4 times a day--before the 3 meals, and at bedtime.  also check if you have symptoms of your blood sugar being too high or too low.  please keep a record of the readings and bring it to your next appointment here.  please call us sooner if your blood sugar goes below 70, or if you have a lot of readings over 200.   Please come back for a follow-up appointment in 3 months.

## 2013-10-01 ENCOUNTER — Emergency Department (HOSPITAL_COMMUNITY): Payer: Medicare Other

## 2013-10-01 ENCOUNTER — Encounter: Payer: Self-pay | Admitting: Internal Medicine

## 2013-10-01 ENCOUNTER — Ambulatory Visit (INDEPENDENT_AMBULATORY_CARE_PROVIDER_SITE_OTHER): Payer: Medicare Other | Admitting: Internal Medicine

## 2013-10-01 ENCOUNTER — Encounter (HOSPITAL_COMMUNITY): Payer: Self-pay | Admitting: Emergency Medicine

## 2013-10-01 ENCOUNTER — Inpatient Hospital Stay (HOSPITAL_COMMUNITY)
Admission: EM | Admit: 2013-10-01 | Discharge: 2013-10-07 | DRG: 444 | Disposition: A | Discharge status: Home / Self Care | Payer: Medicare Other | Attending: Internal Medicine | Admitting: Internal Medicine

## 2013-10-01 VITALS — BP 120/72 | HR 74 | Temp 98.4°F | Resp 16 | Ht 63.0 in | Wt 90.5 lb

## 2013-10-01 DIAGNOSIS — Z794 Long term (current) use of insulin: Secondary | ICD-10-CM | POA: Diagnosis not present

## 2013-10-01 DIAGNOSIS — E1049 Type 1 diabetes mellitus with other diabetic neurological complication: Secondary | ICD-10-CM

## 2013-10-01 DIAGNOSIS — F1120 Opioid dependence, uncomplicated: Secondary | ICD-10-CM | POA: Diagnosis present

## 2013-10-01 DIAGNOSIS — Z836 Family history of other diseases of the respiratory system: Secondary | ICD-10-CM

## 2013-10-01 DIAGNOSIS — T887XXA Unspecified adverse effect of drug or medicament, initial encounter: Secondary | ICD-10-CM

## 2013-10-01 DIAGNOSIS — F1320 Sedative, hypnotic or anxiolytic dependence, uncomplicated: Secondary | ICD-10-CM | POA: Diagnosis present

## 2013-10-01 DIAGNOSIS — K831 Obstruction of bile duct: Principal | ICD-10-CM

## 2013-10-01 DIAGNOSIS — R4182 Altered mental status, unspecified: Secondary | ICD-10-CM | POA: Insufficient documentation

## 2013-10-01 DIAGNOSIS — Z681 Body mass index (BMI) 19 or less, adult: Secondary | ICD-10-CM

## 2013-10-01 DIAGNOSIS — E46 Unspecified protein-calorie malnutrition: Secondary | ICD-10-CM | POA: Diagnosis present

## 2013-10-01 DIAGNOSIS — R5383 Other fatigue: Secondary | ICD-10-CM

## 2013-10-01 DIAGNOSIS — T40605A Adverse effect of unspecified narcotics, initial encounter: Secondary | ICD-10-CM | POA: Diagnosis present

## 2013-10-01 DIAGNOSIS — R059 Cough, unspecified: Secondary | ICD-10-CM

## 2013-10-01 DIAGNOSIS — E213 Hyperparathyroidism, unspecified: Secondary | ICD-10-CM | POA: Diagnosis present

## 2013-10-01 DIAGNOSIS — K5900 Constipation, unspecified: Secondary | ICD-10-CM

## 2013-10-01 DIAGNOSIS — Z888 Allergy status to other drugs, medicaments and biological substances status: Secondary | ICD-10-CM | POA: Diagnosis not present

## 2013-10-01 DIAGNOSIS — F19939 Other psychoactive substance use, unspecified with withdrawal, unspecified: Secondary | ICD-10-CM | POA: Diagnosis not present

## 2013-10-01 DIAGNOSIS — R05 Cough: Secondary | ICD-10-CM

## 2013-10-01 DIAGNOSIS — G43119 Migraine with aura, intractable, without status migrainosus: Secondary | ICD-10-CM

## 2013-10-01 DIAGNOSIS — Z9010 Acquired absence of unspecified breast and nipple: Secondary | ICD-10-CM

## 2013-10-01 DIAGNOSIS — R400 Somnolence: Secondary | ICD-10-CM

## 2013-10-01 DIAGNOSIS — R058 Other specified cough: Secondary | ICD-10-CM | POA: Insufficient documentation

## 2013-10-01 DIAGNOSIS — E860 Dehydration: Secondary | ICD-10-CM

## 2013-10-01 DIAGNOSIS — I10 Essential (primary) hypertension: Secondary | ICD-10-CM

## 2013-10-01 DIAGNOSIS — Z8249 Family history of ischemic heart disease and other diseases of the circulatory system: Secondary | ICD-10-CM

## 2013-10-01 DIAGNOSIS — Z79899 Other long term (current) drug therapy: Secondary | ICD-10-CM | POA: Diagnosis not present

## 2013-10-01 DIAGNOSIS — E2749 Other adrenocortical insufficiency: Secondary | ICD-10-CM | POA: Diagnosis present

## 2013-10-01 DIAGNOSIS — R112 Nausea with vomiting, unspecified: Secondary | ICD-10-CM

## 2013-10-01 DIAGNOSIS — R569 Unspecified convulsions: Secondary | ICD-10-CM | POA: Diagnosis not present

## 2013-10-01 DIAGNOSIS — R404 Transient alteration of awareness: Secondary | ICD-10-CM

## 2013-10-01 DIAGNOSIS — G43819 Other migraine, intractable, without status migrainosus: Secondary | ICD-10-CM

## 2013-10-01 DIAGNOSIS — L680 Hirsutism: Secondary | ICD-10-CM

## 2013-10-01 DIAGNOSIS — Z833 Family history of diabetes mellitus: Secondary | ICD-10-CM | POA: Diagnosis not present

## 2013-10-01 DIAGNOSIS — K5909 Other constipation: Secondary | ICD-10-CM | POA: Diagnosis present

## 2013-10-01 DIAGNOSIS — J449 Chronic obstructive pulmonary disease, unspecified: Secondary | ICD-10-CM

## 2013-10-01 DIAGNOSIS — E876 Hypokalemia: Secondary | ICD-10-CM

## 2013-10-01 DIAGNOSIS — R42 Dizziness and giddiness: Secondary | ICD-10-CM

## 2013-10-01 DIAGNOSIS — K219 Gastro-esophageal reflux disease without esophagitis: Secondary | ICD-10-CM | POA: Diagnosis present

## 2013-10-01 DIAGNOSIS — E162 Hypoglycemia, unspecified: Secondary | ICD-10-CM

## 2013-10-01 DIAGNOSIS — N12 Tubulo-interstitial nephritis, not specified as acute or chronic: Secondary | ICD-10-CM | POA: Diagnosis present

## 2013-10-01 DIAGNOSIS — Z882 Allergy status to sulfonamides status: Secondary | ICD-10-CM | POA: Diagnosis not present

## 2013-10-01 DIAGNOSIS — S270XXA Traumatic pneumothorax, initial encounter: Secondary | ICD-10-CM

## 2013-10-01 DIAGNOSIS — F11221 Opioid dependence with intoxication delirium: Secondary | ICD-10-CM

## 2013-10-01 DIAGNOSIS — R296 Repeated falls: Secondary | ICD-10-CM

## 2013-10-01 DIAGNOSIS — F3289 Other specified depressive episodes: Secondary | ICD-10-CM | POA: Diagnosis present

## 2013-10-01 DIAGNOSIS — F411 Generalized anxiety disorder: Secondary | ICD-10-CM

## 2013-10-01 DIAGNOSIS — R1310 Dysphagia, unspecified: Secondary | ICD-10-CM | POA: Diagnosis present

## 2013-10-01 DIAGNOSIS — R627 Adult failure to thrive: Secondary | ICD-10-CM | POA: Diagnosis present

## 2013-10-01 DIAGNOSIS — J019 Acute sinusitis, unspecified: Secondary | ICD-10-CM

## 2013-10-01 DIAGNOSIS — N179 Acute kidney failure, unspecified: Secondary | ICD-10-CM | POA: Diagnosis present

## 2013-10-01 DIAGNOSIS — Z853 Personal history of malignant neoplasm of breast: Secondary | ICD-10-CM | POA: Diagnosis not present

## 2013-10-01 DIAGNOSIS — J4489 Other specified chronic obstructive pulmonary disease: Secondary | ICD-10-CM

## 2013-10-01 DIAGNOSIS — Z9181 History of falling: Secondary | ICD-10-CM

## 2013-10-01 DIAGNOSIS — M7072 Other bursitis of hip, left hip: Secondary | ICD-10-CM

## 2013-10-01 DIAGNOSIS — R609 Edema, unspecified: Secondary | ICD-10-CM

## 2013-10-01 DIAGNOSIS — G8929 Other chronic pain: Secondary | ICD-10-CM | POA: Diagnosis present

## 2013-10-01 DIAGNOSIS — F11229 Opioid dependence with intoxication, unspecified: Secondary | ICD-10-CM

## 2013-10-01 DIAGNOSIS — R079 Chest pain, unspecified: Secondary | ICD-10-CM

## 2013-10-01 DIAGNOSIS — S2239XA Fracture of one rib, unspecified side, initial encounter for closed fracture: Secondary | ICD-10-CM

## 2013-10-01 DIAGNOSIS — R071 Chest pain on breathing: Secondary | ICD-10-CM

## 2013-10-01 DIAGNOSIS — R7401 Elevation of levels of liver transaminase levels: Secondary | ICD-10-CM

## 2013-10-01 DIAGNOSIS — G43909 Migraine, unspecified, not intractable, without status migrainosus: Secondary | ICD-10-CM

## 2013-10-01 DIAGNOSIS — K5730 Diverticulosis of large intestine without perforation or abscess without bleeding: Secondary | ICD-10-CM | POA: Diagnosis present

## 2013-10-01 DIAGNOSIS — F17200 Nicotine dependence, unspecified, uncomplicated: Secondary | ICD-10-CM

## 2013-10-01 DIAGNOSIS — F19929 Other psychoactive substance use, unspecified with intoxication, unspecified: Secondary | ICD-10-CM

## 2013-10-01 DIAGNOSIS — F329 Major depressive disorder, single episode, unspecified: Secondary | ICD-10-CM | POA: Diagnosis present

## 2013-10-01 DIAGNOSIS — R197 Diarrhea, unspecified: Secondary | ICD-10-CM

## 2013-10-01 DIAGNOSIS — IMO0001 Reserved for inherently not codable concepts without codable children: Secondary | ICD-10-CM

## 2013-10-01 DIAGNOSIS — M25562 Pain in left knee: Secondary | ICD-10-CM

## 2013-10-01 DIAGNOSIS — N63 Unspecified lump in unspecified breast: Secondary | ICD-10-CM

## 2013-10-01 DIAGNOSIS — M810 Age-related osteoporosis without current pathological fracture: Secondary | ICD-10-CM

## 2013-10-01 DIAGNOSIS — G92 Toxic encephalopathy: Secondary | ICD-10-CM | POA: Diagnosis present

## 2013-10-01 DIAGNOSIS — Z9089 Acquired absence of other organs: Secondary | ICD-10-CM | POA: Diagnosis not present

## 2013-10-01 DIAGNOSIS — Z79810 Long term (current) use of selective estrogen receptor modulators (SERMs): Secondary | ICD-10-CM

## 2013-10-01 DIAGNOSIS — Z806 Family history of leukemia: Secondary | ICD-10-CM

## 2013-10-01 DIAGNOSIS — L905 Scar conditions and fibrosis of skin: Secondary | ICD-10-CM

## 2013-10-01 DIAGNOSIS — K921 Melena: Secondary | ICD-10-CM

## 2013-10-01 DIAGNOSIS — E559 Vitamin D deficiency, unspecified: Secondary | ICD-10-CM | POA: Diagnosis present

## 2013-10-01 DIAGNOSIS — G929 Unspecified toxic encephalopathy: Secondary | ICD-10-CM | POA: Diagnosis present

## 2013-10-01 DIAGNOSIS — Z860100 Personal history of colon polyps, unspecified: Secondary | ICD-10-CM

## 2013-10-01 DIAGNOSIS — B079 Viral wart, unspecified: Secondary | ICD-10-CM

## 2013-10-01 DIAGNOSIS — E109 Type 1 diabetes mellitus without complications: Secondary | ICD-10-CM | POA: Diagnosis present

## 2013-10-01 DIAGNOSIS — K2270 Barrett's esophagus without dysplasia: Secondary | ICD-10-CM

## 2013-10-01 DIAGNOSIS — G928 Other toxic encephalopathy: Secondary | ICD-10-CM

## 2013-10-01 DIAGNOSIS — S271XXA Traumatic hemothorax, initial encounter: Secondary | ICD-10-CM

## 2013-10-01 DIAGNOSIS — R5381 Other malaise: Secondary | ICD-10-CM

## 2013-10-01 DIAGNOSIS — R55 Syncope and collapse: Secondary | ICD-10-CM

## 2013-10-01 DIAGNOSIS — R740 Nonspecific elevation of levels of transaminase and lactic acid dehydrogenase [LDH]: Secondary | ICD-10-CM

## 2013-10-01 DIAGNOSIS — E43 Unspecified severe protein-calorie malnutrition: Secondary | ICD-10-CM

## 2013-10-01 DIAGNOSIS — D649 Anemia, unspecified: Secondary | ICD-10-CM

## 2013-10-01 DIAGNOSIS — R51 Headache: Secondary | ICD-10-CM

## 2013-10-01 DIAGNOSIS — R7402 Elevation of levels of lactic acid dehydrogenase (LDH): Secondary | ICD-10-CM

## 2013-10-01 DIAGNOSIS — B370 Candidal stomatitis: Secondary | ICD-10-CM

## 2013-10-01 DIAGNOSIS — F29 Unspecified psychosis not due to a substance or known physiological condition: Secondary | ICD-10-CM

## 2013-10-01 DIAGNOSIS — E278 Other specified disorders of adrenal gland: Secondary | ICD-10-CM

## 2013-10-01 DIAGNOSIS — L97509 Non-pressure chronic ulcer of other part of unspecified foot with unspecified severity: Secondary | ICD-10-CM

## 2013-10-01 DIAGNOSIS — S2249XA Multiple fractures of ribs, unspecified side, initial encounter for closed fracture: Secondary | ICD-10-CM

## 2013-10-01 DIAGNOSIS — K838 Other specified diseases of biliary tract: Secondary | ICD-10-CM

## 2013-10-01 HISTORY — DX: Malignant (primary) neoplasm, unspecified: C80.1

## 2013-10-01 LAB — URINE MICROSCOPIC-ADD ON

## 2013-10-01 LAB — CBC WITH DIFFERENTIAL/PLATELET
BASOS ABS: 0 10*3/uL (ref 0.0–0.1)
Basophils Relative: 1 % (ref 0–1)
EOS PCT: 2 % (ref 0–5)
Eosinophils Absolute: 0.2 10*3/uL (ref 0.0–0.7)
HEMATOCRIT: 33.5 % — AB (ref 36.0–46.0)
Hemoglobin: 11.1 g/dL — ABNORMAL LOW (ref 12.0–15.0)
Lymphocytes Relative: 36 % (ref 12–46)
Lymphs Abs: 3 10*3/uL (ref 0.7–4.0)
MCH: 30.2 pg (ref 26.0–34.0)
MCHC: 33.1 g/dL (ref 30.0–36.0)
MCV: 91 fL (ref 78.0–100.0)
MONO ABS: 0.6 10*3/uL (ref 0.1–1.0)
Monocytes Relative: 7 % (ref 3–12)
NEUTROS ABS: 4.4 10*3/uL (ref 1.7–7.7)
Neutrophils Relative %: 54 % (ref 43–77)
Platelets: 188 10*3/uL (ref 150–400)
RBC: 3.68 MIL/uL — ABNORMAL LOW (ref 3.87–5.11)
RDW: 14.8 % (ref 11.5–15.5)
WBC: 8.2 10*3/uL (ref 4.0–10.5)

## 2013-10-01 LAB — COMPREHENSIVE METABOLIC PANEL
ALT: 139 U/L — ABNORMAL HIGH (ref 0–35)
AST: 73 U/L — ABNORMAL HIGH (ref 0–37)
Albumin: 3.5 g/dL (ref 3.5–5.2)
Alkaline Phosphatase: 257 U/L — ABNORMAL HIGH (ref 39–117)
Anion gap: 12 (ref 5–15)
BUN: 41 mg/dL — ABNORMAL HIGH (ref 6–23)
CO2: 28 meq/L (ref 19–32)
CREATININE: 2.24 mg/dL — AB (ref 0.50–1.10)
Calcium: 13.1 mg/dL (ref 8.4–10.5)
Chloride: 99 mEq/L (ref 96–112)
GFR, EST AFRICAN AMERICAN: 25 mL/min — AB (ref >90)
GFR, EST NON AFRICAN AMERICAN: 22 mL/min — AB (ref >90)
GLUCOSE: 149 mg/dL — AB (ref 70–99)
Potassium: 3.8 mEq/L (ref 3.7–5.3)
Sodium: 139 mEq/L (ref 137–147)
Total Bilirubin: 0.3 mg/dL (ref 0.3–1.2)
Total Protein: 7.1 g/dL (ref 6.0–8.3)

## 2013-10-01 LAB — URINALYSIS, ROUTINE W REFLEX MICROSCOPIC
Bilirubin Urine: NEGATIVE
Glucose, UA: NEGATIVE mg/dL
Ketones, ur: NEGATIVE mg/dL
Nitrite: NEGATIVE
PROTEIN: NEGATIVE mg/dL
Specific Gravity, Urine: 1.014 (ref 1.005–1.030)
Urobilinogen, UA: 0.2 mg/dL (ref 0.0–1.0)
pH: 7 (ref 5.0–8.0)

## 2013-10-01 LAB — BASIC METABOLIC PANEL
ANION GAP: 10 (ref 5–15)
BUN: 40 mg/dL — ABNORMAL HIGH (ref 6–23)
CALCIUM: 11.9 mg/dL — AB (ref 8.4–10.5)
CO2: 27 meq/L (ref 19–32)
Chloride: 104 mEq/L (ref 96–112)
Creatinine, Ser: 2.13 mg/dL — ABNORMAL HIGH (ref 0.50–1.10)
GFR calc non Af Amer: 23 mL/min — ABNORMAL LOW (ref >90)
GFR, EST AFRICAN AMERICAN: 27 mL/min — AB (ref >90)
Glucose, Bld: 93 mg/dL (ref 70–99)
Potassium: 3.7 mEq/L (ref 3.7–5.3)
SODIUM: 141 meq/L (ref 137–147)

## 2013-10-01 LAB — CBG MONITORING, ED: Glucose-Capillary: 134 mg/dL — ABNORMAL HIGH (ref 70–99)

## 2013-10-01 LAB — AMMONIA: Ammonia: 21 umol/L (ref 11–60)

## 2013-10-01 LAB — GLUCOSE, CAPILLARY
Glucose-Capillary: 84 mg/dL (ref 70–99)
Glucose-Capillary: 198 mg/dL — ABNORMAL HIGH (ref 70–99)

## 2013-10-01 LAB — I-STAT TROPONIN, ED: TROPONIN I, POC: 0 ng/mL (ref 0.00–0.08)

## 2013-10-01 LAB — I-STAT CG4 LACTIC ACID, ED: Lactic Acid, Venous: 1.24 mmol/L (ref 0.5–2.2)

## 2013-10-01 MED ORDER — THIAMINE HCL 100 MG/ML IJ SOLN
100.0000 mg | Freq: Every day | INTRAMUSCULAR | Status: DC
  Administered 2013-10-01 – 2013-10-07 (×7): 100 mg via INTRAVENOUS
  Filled 2013-10-01 (×4): qty 1
  Filled 2013-10-01: qty 2
  Filled 2013-10-01 (×3): qty 1

## 2013-10-01 MED ORDER — MORPHINE SULFATE 4 MG/ML IJ SOLN
4.0000 mg | Freq: Once | INTRAMUSCULAR | Status: AC
  Administered 2013-10-01: 4 mg via INTRAVENOUS
  Filled 2013-10-01: qty 1

## 2013-10-01 MED ORDER — TAMOXIFEN CITRATE 20 MG PO TABS
20.0000 mg | ORAL_TABLET | Freq: Two times a day (BID) | ORAL | Status: DC
  Administered 2013-10-01 – 2013-10-07 (×12): 20 mg via ORAL
  Filled 2013-10-01 (×17): qty 1

## 2013-10-01 MED ORDER — HEPARIN SODIUM (PORCINE) 5000 UNIT/ML IJ SOLN
5000.0000 [IU] | Freq: Three times a day (TID) | INTRAMUSCULAR | Status: DC
  Administered 2013-10-02 – 2013-10-07 (×15): 5000 [IU] via SUBCUTANEOUS
  Filled 2013-10-01 (×22): qty 1

## 2013-10-01 MED ORDER — SODIUM CHLORIDE 0.9 % IV SOLN
1000.0000 mL | INTRAVENOUS | Status: DC
  Administered 2013-10-01 – 2013-10-03 (×6): 1000 mL via INTRAVENOUS

## 2013-10-01 MED ORDER — INSULIN ASPART 100 UNIT/ML ~~LOC~~ SOLN
0.0000 [IU] | Freq: Three times a day (TID) | SUBCUTANEOUS | Status: DC
  Administered 2013-10-02: 3 [IU] via SUBCUTANEOUS
  Administered 2013-10-03: 5 [IU] via SUBCUTANEOUS
  Administered 2013-10-03 – 2013-10-04 (×2): 3 [IU] via SUBCUTANEOUS
  Administered 2013-10-04: 5 [IU] via SUBCUTANEOUS
  Administered 2013-10-05 (×2): 1 [IU] via SUBCUTANEOUS
  Administered 2013-10-06: 2 [IU] via SUBCUTANEOUS

## 2013-10-01 MED ORDER — SODIUM CHLORIDE 0.9 % IV SOLN
1000.0000 mL | Freq: Once | INTRAVENOUS | Status: AC
Start: 2013-10-01 — End: 2013-10-01
  Administered 2013-10-01: 1000 mL via INTRAVENOUS

## 2013-10-01 MED ORDER — NICOTINE 21 MG/24HR TD PT24
21.0000 mg | MEDICATED_PATCH | Freq: Every day | TRANSDERMAL | Status: DC
  Administered 2013-10-01 – 2013-10-07 (×7): 21 mg via TRANSDERMAL
  Filled 2013-10-01 (×7): qty 1

## 2013-10-01 MED ORDER — VORTIOXETINE HBR 20 MG PO TABS
10.0000 mg | ORAL_TABLET | Freq: Every day | ORAL | Status: DC
  Administered 2013-10-02 – 2013-10-07 (×6): 10 mg via ORAL
  Filled 2013-10-01 (×7): qty 10

## 2013-10-01 MED ORDER — ATENOLOL 100 MG PO TABS
100.0000 mg | ORAL_TABLET | ORAL | Status: DC
  Administered 2013-10-02 – 2013-10-07 (×6): 100 mg via ORAL
  Filled 2013-10-01 (×7): qty 1

## 2013-10-01 MED ORDER — OXYCODONE HCL ER 10 MG PO T12A
10.0000 mg | EXTENDED_RELEASE_TABLET | Freq: Two times a day (BID) | ORAL | Status: DC
  Administered 2013-10-01: 10 mg via ORAL
  Filled 2013-10-01: qty 1

## 2013-10-01 MED ORDER — LORAZEPAM 1 MG PO TABS: 1.0000 mg | ORAL_TABLET | Freq: Three times a day (TID) | ORAL | Status: DC | PRN

## 2013-10-01 MED ORDER — HYDROCORTISONE 10 MG PO TABS
10.0000 mg | ORAL_TABLET | Freq: Two times a day (BID) | ORAL | Status: DC
  Administered 2013-10-01 – 2013-10-07 (×12): 10 mg via ORAL
  Filled 2013-10-01 (×13): qty 1

## 2013-10-01 MED ORDER — LORAZEPAM 1 MG PO TABS
1.0000 mg | ORAL_TABLET | Freq: Two times a day (BID) | ORAL | Status: DC | PRN
  Administered 2013-10-01 – 2013-10-04 (×5): 1 mg via ORAL
  Filled 2013-10-01 (×5): qty 1

## 2013-10-01 MED ORDER — OXYCODONE HCL 5 MG PO TABS
5.0000 mg | ORAL_TABLET | Freq: Two times a day (BID) | ORAL | Status: DC | PRN
  Administered 2013-10-01 – 2013-10-07 (×7): 5 mg via ORAL
  Filled 2013-10-01 (×8): qty 1

## 2013-10-01 MED ORDER — OXYCODONE HCL 5 MG PO TABS: 5.0000 mg | ORAL_TABLET | Freq: Two times a day (BID) | ORAL | Status: DC | PRN

## 2013-10-01 MED ORDER — SUMATRIPTAN SUCCINATE 100 MG PO TABS
100.0000 mg | ORAL_TABLET | ORAL | Status: DC | PRN
  Administered 2013-10-02 – 2013-10-07 (×6): 100 mg via ORAL
  Filled 2013-10-01 (×9): qty 1

## 2013-10-01 MED ORDER — ATORVASTATIN CALCIUM 10 MG PO TABS
10.0000 mg | ORAL_TABLET | Freq: Every day | ORAL | Status: DC
  Filled 2013-10-01: qty 1

## 2013-10-01 MED ORDER — INSULIN DETEMIR 100 UNIT/ML ~~LOC~~ SOLN
8.0000 [IU] | Freq: Every day | SUBCUTANEOUS | Status: DC
  Administered 2013-10-01 – 2013-10-06 (×6): 8 [IU] via SUBCUTANEOUS
  Filled 2013-10-01 (×7): qty 0.08

## 2013-10-01 MED ORDER — PANTOPRAZOLE SODIUM 40 MG PO TBEC
40.0000 mg | DELAYED_RELEASE_TABLET | Freq: Every day | ORAL | Status: DC
  Administered 2013-10-01 – 2013-10-07 (×7): 40 mg via ORAL
  Filled 2013-10-01 (×7): qty 1

## 2013-10-01 NOTE — Progress Notes (Signed)
Pt admitted to the unit at 1656. Pt mental status is A&Ox4. Pt oriented to room, staff, and call bell. Skin is intact other than scab to left elbow and laceration on head from recent fall. Full assessment charted in [facility]. Call bell within reach. Visitor guidelines reviewed w/ pt and/or family.

## 2013-10-01 NOTE — Assessment & Plan Note (Signed)
To [facility] ER

## 2013-10-01 NOTE — Assessment & Plan Note (Signed)
We are in the process of weaning Lindsey Gomez off meds Inpatient detox would be preffered

## 2013-10-01 NOTE — Assessment & Plan Note (Signed)
Poor control - chronic compliance issues

## 2013-10-01 NOTE — Progress Notes (Addendum)
Ambra is here with her housekeeper [name] and dtr   Subjective:   C/o sleeping x 18 hrs/d lately, not eating x 2 wks. FTT [name] cough -  better after abx, now is coming back - green mucus. Yasira fell on 7/22, hit L temple  C/o n/v; lost wt  Migraine  This is a chronic problem. The current episode started more than 1 year ago. The problem occurs daily. The problem has been waxing and waning (may be better a little). The pain is located in the bilateral region. The pain radiates to the right neck and left neck. The pain quality is similar to prior headaches. The quality of the pain is described as shooting. The pain is at a severity of 7/10. The pain is severe. Associated symptoms include coughing, dizziness, muscle aches, nausea, vomiting and weakness. Pertinent negatives include no ear pain, fever, neck pain or rhinorrhea. The symptoms are aggravated by fatigue. Her past medical history is significant for cancer and migraine headaches.     Laylani denies OSA [name] severe migraines - better. [name] on severe pain - neck, back.  The patient presents for a follow-up of chronic HAs, depression, anxiety, hypertension, chronic dyslipidemia, type 1 diabetes.   [name] is doing ok, still stressed. [name] more anxiety; depressed - [facility] is helping at home. Valyncia has a housekeeper [employer] from Last 3 Encounters:  10/01/13 90 lb 8 oz (41.051 kg)  09/30/13 91 lb (41.277 kg)  09/10/13 91 lb (41.277 kg)   BP Readings from Last 3 Encounters:  10/01/13 120/72  09/30/13 122/80  09/10/13 108/70      Review of Systems  Constitutional: Positive for activity change, appetite change, fatigue and unexpected weight change. Negative for fever and chills.  HENT: Negative for congestion, ear pain, mouth sores, rhinorrhea and voice change.   Eyes: Negative for visual disturbance.  Respiratory: Positive for cough and shortness of breath. Negative for chest tightness and wheezing.   Cardiovascular:  Negative for leg swelling.  Gastrointestinal: Positive for nausea and vomiting.  Genitourinary: Negative for urgency, frequency, difficulty urinating and vaginal pain.  Musculoskeletal: Positive for arthralgias and gait problem. Negative for neck pain and neck stiffness.  Skin: Positive for wound. Negative for pallor and rash.  Neurological: Positive for dizziness, weakness, light-headedness and headaches. Negative for tremors and syncope.  Psychiatric/Behavioral: Positive for behavioral problems, sleep disturbance and decreased concentration. Negative for suicidal ideas, confusion, self-injury, dysphoric mood and agitation. The patient is nervous/anxious.        Objective:   Physical Exam  Constitutional: She appears well-developed.  Ill appearing  HENT:  Head: Normocephalic.  Right Ear: External ear normal.  Left Ear: External ear normal.  Nose: Nose normal.  Mouth/Throat: Oropharynx is clear and moist. No oropharyngeal exudate.  dry  Eyes: Conjunctivae are normal. Pupils are equal, round, and reactive to light. Right eye exhibits no discharge. Left eye exhibits no discharge.  Neck: Normal range of motion. Neck supple. No JVD present. No tracheal deviation present. No thyromegaly present.  Cardiovascular: Normal rate, regular rhythm and normal heart sounds.   Pulmonary/Chest: No stridor. No respiratory distress. She has no wheezes.  Abdominal: Soft. Bowel sounds are normal. She exhibits no distension and no mass. There is no tenderness. There is no rebound and no guarding.  Musculoskeletal: She exhibits no edema and no tenderness.  Lymphadenopathy:    She has no cervical adenopathy.  Neurological: She displays normal reflexes. No cranial nerve deficit. She exhibits  normal muscle tone. Coordination abnormal.  Ataxic Slow speech Flat affect  Skin: Skin is warm. No rash noted. She is not diaphoretic. No erythema.  somnolent, cachectic   Lab Results  Component Value Date   WBC 9.0  09/10/2013   HGB 12.0 09/10/2013   HCT 35.7* 09/10/2013   PLT 184.0 09/10/2013   GLUCOSE 88 09/10/2013   CHOL 123 09/10/2013   TRIG 82.0 09/10/2013   HDL 72.80 09/10/2013   LDLDIRECT 131.9 06/15/2010   LDLCALC 34 09/10/2013   ALT 21 09/10/2013   AST 29 09/10/2013   NA 140 09/10/2013   K 3.4* 09/10/2013   CL 109 09/10/2013   CREATININE 2.0* 09/10/2013   BUN 36* 09/10/2013   CO2 24 09/10/2013   TSH 1.93 03/20/2013   INR 0.9 09/01/2010   HGBA1C 7.1* 06/19/2013   MICROALBUR 0.8 08/16/2011   Pt needs inpatient care   a complex case  Assessment & Plan:

## 2013-10-01 NOTE — Progress Notes (Signed)
Received patient assignment

## 2013-10-01 NOTE — ED Notes (Signed)
Per daughter (from [place]) sts caretaker has said pt has been increasingly fatigued and lethargic, generalized weakness, nausea & vomiting x 1 week. Pt endorses decreased appetite x 3 months. Pt endorses headache 9/10 no neuro deficits noted. Family sts patient had fallen yesterday morning when she tried to get up from bed to go to the bathroom and fell and hit back of head on dresser. Small scabbed laceration noted. Pt sts feels like her headache is typical of migraine pain and not due to lac.

## 2013-10-01 NOTE — ED Provider Notes (Signed)
CSN: [id number]     Arrival date & time 10/01/13  1258 History   First MD Initiated Contact with Patient 10/01/13 1301     Chief Complaint  Patient presents with  . Fatigue     (Consider location/radiation/quality/duration/timing/severity/associated sxs/prior Treatment) The history is provided by the patient. No language interpreter was used.    Lindsey Gomez Is a 68 year old female sent in by her primary care physician for her lethargy weakness and failure to thrive. The history is obtained from her primary care physician whom I spoke with over the phone, her daughter, and her home health caretaker. 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 She also has a past surgical history of appendectomy, partial hysterectomy, and one third of her left lung removed due to fungal infection. She is a chronic daily smoker. According to her primary care physician she has had a steady decline in health for the past 4 years with a failure to thrive. He states that she has decreased oral intake and he is concerned for acute dehydration. She has been falling recently at home and fell 2 days ago hitting her head on the floor with a small laceration. 2 weeks ago she was diagnosed with what he felt was aspiration pneumonia, she was treated outpatient, symptoms seemed to resolve however they have come back and she has a current cough productive of green sputum.. she has been sleeping up to 18 hours daily with confusion and altered mental status. According to her PCP, Dr. Alain Marion and  Her caregiver there is major concern for opiate abuse and overuse of medications. The caretaker states that 120 tramadol are missing from her husband's medications supply. The caretaker states that she is refusing to  eat at home, is frequently confused and frequently falls. She also refuses to relinquish any responsibility in the caretaking and medication management of her husband who has Parkinson's disease. Dr. Alain Marion is concerned that she has may have a metastatic disease of her previous cancer. Her caretaker states that she has become more weak. Over the past year she has noticed a shuffling gait develop. She has difficulty starting her words, slowed speech, slowed mentation had been developing progressively over the past year as well. The patient complains of pain, chronic headache, chronic constipation. She states she only makes 2 bowel movements a month. Her caretaker states that she refuses to take laxatives.  Past Medical History  Diagnosis Date  . Depression   . Type II or unspecified type diabetes mellitus without mention of complication, not stated as uncontrolled   . HTN (hypertension)   . Chronic pain   . Fibromyalgia   . Migraines   . History of breast cancer     Right  . Chronic pancreatitis   . Vitamin D deficiency   . GERD (gastroesophageal reflux disease)   . Finger dislocation 2009    L 3rd  . COPD (chronic obstructive pulmonary disease)   . Anemia   . Fractured sternum 11/2008  . Osteoporosis     Reclast too expensive  . Barrett's esophagus   . Chronic fatigue   . Diverticulosis   . Opioid dependence    Past Surgical History  Procedure Laterality Date  . Nasal sinus surgery    . Pancreatectomy      40%  . Cholecystectomy    .  Appendectomy    . Abdominal hysterectomy     Family History  Problem Relation Age of Onset  . Colon cancer Neg Hx   . COPD Father   . Heart disease Father   . Leukemia Mother   . Diabetes Maternal Uncle    History  Substance Use Topics  . Smoking status: Current Every Day Smoker -- 2.00 packs/day  . Smokeless tobacco: Never Used  . Alcohol Use: No   OB History   Grav Para Term Preterm Abortions TAB SAB Ect Mult Living                  Review of Systems  Unable to perform ROS     Allergies  Gabapentin; Milnacipran; Moxifloxacin; Rizatriptan benzoate; Sulfonamide derivatives; and Venlafaxine  Home Medications   Prior to Admission medications   Medication Sig Start Date End Date Taking? Authorizing Provider  ACCU-CHEK SOFTCLIX LANCETS lancets Use as instructed 12/07/11   Lew Dawes V, MD  atenolol (TENORMIN) 100 MG tablet Take 1 tablet (100 mg total) by mouth every morning. 05/15/13   Aleksei Plotnikov V, MD  atorvastatin (LIPITOR) 10 MG tablet Take 1 tablet (10 mg total) by mouth daily. 05/15/13 05/18/14  Aleksei Plotnikov V, MD  B-D ULTRAFINE III SHORT PEN 31G X 8 MM MISC USE 5 TIMES A DAY AS DIRECTED 01/11/13   Aleksei Plotnikov V, MD  calcitRIOL (ROCALTROL) 0.5 MCG capsule Take 2 capsules (1 mcg total) by mouth daily. 05/15/13   Aleksei Plotnikov V, MD  glucagon 1 MG injection Inject 1 mg into the vein once as needed. 12/04/11   Renato Shin, MD  glucose blood (ACCU-CHEK AVIVA PLUS) test strip Use 5 times a day as instructed 07/17/13   Lew Dawes V, MD  hydrocortisone (CORTEF) 10 MG tablet Take 1 tablet (10 mg total) by mouth 2 (two) times daily. 05/15/13   Aleksei Plotnikov V, MD  insulin aspart (NOVOLOG) 100 UNIT/ML FlexPen 10 units with breakfast 4 units with lunch and 5 units with evening meal, and pen needles 4/day 01/13/13   Renato Shin, MD  insulin detemir (LEVEMIR) 100 UNIT/ML injection Inject 8 Units into the skin at bedtime.  05/27/13   Renato Shin, MD  LORazepam (ATIVAN) 1 MG tablet Take 1 tablet (1 mg total) by mouth every 8 (eight) hours as needed for anxiety. 10/01/13   Aleksei Plotnikov V, MD  NASONEX 50 MCG/ACT nasal spray INHALE 2 SPRAYS INTO EACH NOSTRIL ONCE DAILY 06/10/13   Aleksei Plotnikov V, MD  omeprazole (PRILOSEC) 40 MG capsule Take 1 capsule (40 mg total) by mouth 2 (two) times daily. 07/17/13   Aleksei Plotnikov V, MD  OxyCODONE (OXYCONTIN) 10 mg T12A 12 hr tablet Take 1 tablet (10 mg total) by  mouth 3 (three) times daily. 09/10/13   Aleksei Plotnikov V, MD  oxyCODONE (ROXICODONE) 5 MG immediate release tablet Take 1 tablet (5 mg total) by mouth 2 (two) times daily as needed for severe pain. 10/01/13   Aleksei Plotnikov V, MD  tamoxifen (NOLVADEX) 20 MG tablet Take 20 mg by mouth 2 (two) times daily.      Historical Provider, MD  topiramate (TOPAMAX) 100 MG tablet Take 0.5 tablets (50 mg total) by mouth 2 (two) times daily. 09/10/13   Aleksei Plotnikov V, MD  triamcinolone (KENALOG) 0.1 % ointment Apply topically 2 (two) times daily as needed.      Historical Provider, MD  Vortioxetine HBr (BRINTELLIX) 20 MG TABS Take 20 mg by mouth daily. 08/14/13  Aleksei Plotnikov V, MD  zolmitriptan (ZOMIG) 5 MG tablet TAKE 1 TABLET BY MOUTH AS NEEDED MIGRAINE 12/22/12   Aleksei Plotnikov V, MD   BP 139/83  Pulse 68  Temp(Src) 96.9 F (36.1 C) (Rectal)  Resp 16  Ht 5\' 1"  (1.549 m)  Wt 90 lb 8 oz (41.051 kg)  BMI 17.11 kg/m2  SpO2 99% Physical Exam  Nursing note and vitals reviewed. Constitutional: She is oriented to person, place, and time.  Cachectic appearing female, mask facies, no acute distress  HENT:  Head: Normocephalic.  4 cm well-healing laceration to the posterior left occiput  Eyes: Conjunctivae and EOM are normal. Pupils are equal, round, and reactive to light.  Neck: No JVD present. No thyromegaly present.  Cardiovascular: Normal rate and regular rhythm.   Pulmonary/Chest: Effort normal. No respiratory distress.  Abdominal: Soft. There is no tenderness.  Scaphoid abdomen  Neurological: She is alert and oriented to person, place, and time. No cranial nerve deficit.  Patient is alert and oriented to person place and time Speech is slow, mentation is slowed. Cranial nerves II through XII grossly intact. Normal strength in the upper and lower extremities. Sensation intact grossly Finger to nose exam done an extremely slow fashion. No intention tremor. Shuffling gait, poor balance   Psychiatric:  Patient does not make eye contact    ED Course  Procedures (including critical care time) Labs Review Labs Reviewed  CBC WITH DIFFERENTIAL - Abnormal; Notable for the following:    RBC 3.68 (*)    Hemoglobin 11.1 (*)    HCT 33.5 (*)    All other components within normal limits  COMPREHENSIVE METABOLIC PANEL - Abnormal; Notable for the following:    Glucose, Bld 149 (*)    BUN 41 (*)    Creatinine, Ser 2.24 (*)    Calcium 13.1 (*)    AST 73 (*)    ALT 139 (*)    Alkaline Phosphatase 257 (*)    GFR calc non Af Amer 22 (*)    GFR calc Af Amer 25 (*)    All other components within normal limits  CBG MONITORING, ED - Abnormal; Notable for the following:    Glucose-Capillary 134 (*)    All other components within normal limits  URINALYSIS, ROUTINE W REFLEX MICROSCOPIC  CBG MONITORING, ED  I-STAT CG4 LACTIC ACID, ED  I-STAT TROPOININ, ED    Imaging Review No results found.   EKG Interpretation   Date/Time:  Thursday October 01 2013 14:00:22 EDT Ventricular Rate:  69 PR Interval:  160 QRS Duration: 74 QT Interval:  409 QTC Calculation: 438 R Axis:   75 Text Interpretation:  Sinus rhythm Nonspecific T abnormalities, lateral  leads Confirmed by Jeneen Rinks  MD, [place] ([id number]) on 10/01/2013 2:09:38 PM      MDM   Final diagnoses:  None    Patient seen and should visit with Dr. Tanna Furry. Her delayed mentation, slowed speech, shuffling gait I had concern for possible undiagnosed parkinsonism. Lab results show hypercalcemia which may account for her abnormal neurologic symptoms. Shows an elevation in her liver enzymes as well. Glucose slightly elevated. Her chest x-ray shows improving right middle lobe pneumonia. EEG shows nonspecific T wave abnormalities.     Patient is stable during her visit. Her head CT is pending. Differential is still broad but includes calcium ingestion. Hyperparathyroid, metastatic ca.  I feel her abnormal neurologic sxs are most likely  due to her markedly elevated calcium levels. patient wil be  admitted to inpatient by Dr. Verlon Au.  Social work has consulted with the patient's daughter Suszanne Finch is seeking HCPOA.   I personally reviewed the imaging tests through PACS system. I have reviewed and interpreted Lab values. I reviewed available ER/hospitalization records through the [place], [place] 10/02/13 1121

## 2013-10-01 NOTE — Progress Notes (Signed)
Pre visit review using our clinic review tool, if applicable. No additional management support is needed unless otherwise documented below in the visit note. 

## 2013-10-01 NOTE — ED Notes (Signed)
PA at bedside.

## 2013-10-01 NOTE — Assessment & Plan Note (Signed)
7/15 sleeping 18 hrs/d Fell on 7/22 To [facility] ER

## 2013-10-01 NOTE — ED Notes (Signed)
Critical value 13.1 Calcium, PA and MD notified.

## 2013-10-01 NOTE — Patient Instructions (Signed)
Go to [facility] ER now  (Stop Amitriptyline  Stop Compazine Reduce Oxycodone Reduce Lorazepam)

## 2013-10-01 NOTE — Assessment & Plan Note (Signed)
Would need a chest/abd CT after BUN/creat is corrected to r/o cancer etc

## 2013-10-01 NOTE — Assessment & Plan Note (Signed)
Poor oral intake -  Worse Needs IVF [facility] ER

## 2013-10-01 NOTE — Assessment & Plan Note (Signed)
Cont to smoke 

## 2013-10-01 NOTE — Progress Notes (Signed)
Received report on patient from [place], [place]. Awaiting patient arrival.

## 2013-10-01 NOTE — Assessment & Plan Note (Signed)
7/15 probable pneumonia - suspect aspiration

## 2013-10-01 NOTE — H&P (Signed)
[facility] History and Physical  HEILEY SHAIKH IOE:[id number] DOB: Nov 17, 1945 DOA: 10/01/2013  Referring physician: ED PCP: Walker Kehr, MD  Specialists: none  Chief Complaint: Multiple  HPI: 68 y/o ?, known well documented chronic pain on opiates,  fibromyalgia ty 1 DM, Prior ETOH pancreatitis c h/o Pancreatectomy, migraine, COPD,Gerd+ prior history Barrett's esophagus 09/05/2010 follwed by Dr. Deatra Ina, prior h/o Breast CA? 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 She went for routine followup as she usually does every so often and in because of increasing somnolence [sleeping 18 hours a day], not eating x2 weeks, failure to thrive as well as increasing cough with green mucus was referred to the emergency room  Patient is actually more alert now but does seem to fall asleep or become somnolent if you do not stimulate her. She is a moderate historian but confabulates a little bit and does not remember details of when she had certain things done. Her daughter at bedside give some history and says that she's not been eating past couple of weeks as her housekeeper who usually takes very close care of her had been on vacation. Patient states that she is mainly been subsisting on boost supplements and has not been able to eat. I am unable to get a history from her as to whether she has a sticking sensation with solid food or any face type of food he held some unable to discern from her whether she has a full pain relationship in her stomach or any other  issues. Next I she has been coughing with mucus and has had some posttussive vomiting recently. At baseline she does not have any coughing with eating or drinking. She tells me that she has not had a stool in the past 3 weeks! This is chronic for her she is on chronic opiates and usually she gives herself a laxative to get rid of this Her daughter reports that she seemed more dyspneic yesterday at her outpatient endocrinology visit.  Emergency room workup revealed BUN 41 creatinine 2.2 random calcium 13.1 [this is corrected] Alkaline phosphatase 257, AST 73 ALT 139 Hemoglobin 11.1, hematocrit 33.5. Random glucose 149  Recent TSH 1.9 03/20/13 A1c 7.1 at that time  Chest x-ray improved right middle air lobe airspace disease consistent history pneumonia CT head showed no acute abnormality  Review of Systems: The patient denies  Current fever or chills blurred double vision Dysuria Diarrhea rash sick contacts ill contacts She is only a marginal historian   Past Medical History  Diagnosis Date  . Depression   . Type II or unspecified type diabetes mellitus without mention of complication, not stated as uncontrolled   . HTN (hypertension)   . Chronic pain   . Fibromyalgia   . Migraines   . History of breast cancer     Right  . Chronic pancreatitis   . Vitamin D deficiency   . GERD (gastroesophageal reflux disease)   . Finger dislocation 2009    L 3rd  . COPD (chronic obstructive pulmonary disease)   . Anemia   . Fractured sternum 11/2008  . Osteoporosis     Reclast too expensive  .  Barrett's esophagus   . Chronic fatigue   . Diverticulosis   . Opioid dependence    Past Surgical History  Procedure Laterality Date  . Nasal sinus surgery    . Pancreatectomy      40%  . Cholecystectomy    . Appendectomy    . Abdominal hysterectomy     Social History:  History   Social History Narrative   Did not go back to work Oct 23, [name] is very ill - unable to have financial  ends meet, no income      Regular Exercise -  NO      Coffee daily     Allergies  Allergen Reactions  . Gabapentin     Weak muscles  . Milnacipran     REACTION: HA and hand swelling  . Moxifloxacin   . Rizatriptan Benzoate     REACTION: n/v  . Sulfonamide Derivatives   . Venlafaxine     Family History  Problem Relation Age of Onset  . Colon cancer Neg Hx   . COPD Father   . Heart disease Father   . Leukemia Mother   . Diabetes Maternal Uncle     Prior to Admission medications   Medication Sig Start Date End Date Taking? Authorizing Provider  atenolol (TENORMIN) 100 MG tablet Take 1 tablet (100 mg total) by mouth every morning. 05/15/13   Aleksei Plotnikov V, MD  atorvastatin (LIPITOR) 10 MG tablet Take 1 tablet (10 mg total) by mouth daily. 05/15/13 05/18/14  Aleksei Plotnikov V, MD  calcitRIOL (ROCALTROL) 0.5 MCG capsule Take 2 capsules (1 mcg total) by mouth daily. 05/15/13   Aleksei Plotnikov V, MD  glucagon 1 MG injection Inject 1 mg into the vein once as needed. 12/04/11   Renato Shin, MD  hydrocortisone (CORTEF) 10 MG tablet Take 1 tablet (10 mg total) by mouth 2 (two) times daily. 05/15/13   Aleksei Plotnikov V, MD  insulin aspart (NOVOLOG) 100 UNIT/ML FlexPen 10 units with breakfast 4 units with lunch and 5 units with evening meal, and pen needles 4/day 01/13/13   Renato Shin, MD  insulin detemir (LEVEMIR) 100 UNIT/ML injection Inject 8 Units into the skin at bedtime.  05/27/13   Renato Shin, MD  LORazepam (ATIVAN) 1 MG tablet Take 1 tablet (1 mg total) by mouth every 8 (eight) hours as needed for anxiety. 10/01/13   Aleksei Plotnikov V, MD  NASONEX 50 MCG/ACT nasal spray INHALE 2 SPRAYS INTO EACH NOSTRIL ONCE DAILY 06/10/13   Aleksei Plotnikov V, MD  omeprazole (PRILOSEC) 40 MG capsule Take 1 capsule (40 mg total) by mouth 2 (two) times daily. 07/17/13   Aleksei Plotnikov V, MD  OxyCODONE (OXYCONTIN) 10 mg T12A 12 hr tablet Take 1 tablet (10 mg total) by mouth 3 (three) times  daily. 09/10/13   Aleksei Plotnikov V, MD  oxyCODONE (ROXICODONE) 5 MG immediate release tablet Take 1 tablet (5 mg total) by mouth 2 (two) times daily as needed for severe pain. 10/01/13   Aleksei Plotnikov V, MD  tamoxifen (NOLVADEX) 20 MG tablet Take 20 mg by mouth 2 (two) times daily.      Historical Provider, MD  topiramate (TOPAMAX) 100 MG tablet Take 0.5 tablets (50 mg total) by mouth 2 (two) times daily. 09/10/13   Aleksei Plotnikov V, MD  triamcinolone (KENALOG) 0.1 % ointment Apply topically 2 (two) times daily as needed.      Historical Provider, MD  Vortioxetine HBr (BRINTELLIX) 20 MG TABS Take 20  mg by mouth daily. 08/14/13   Aleksei Plotnikov V, MD  zolmitriptan (ZOMIG) 5 MG tablet TAKE 1 TABLET BY MOUTH AS NEEDED MIGRAINE 12/22/12   Cassandria Anger, MD   Physical Exam: Filed Vitals:   10/01/13 1330 10/01/13 1400 10/01/13 1409 10/01/13 1505  BP: 126/76 139/83  160/83  Pulse: 64 68  60  Temp:   96.9 F (36.1 C)   TempSrc:   Rectal   Resp: 17 16  12   Height:      Weight:      SpO2: 99% 99%  100%     General:  Alert but falls asleep easily and without stimulation , temporalis wasting   Eyes: EOMI, NCAT mild pallor   ENT: Moderate dentition   Neck: Soft no lymphadenopathy appreciated no JVD   Cardiovascular: S1-S2 no murmur rub or gallop   Respiratory: Clinically clear no added sound , scar in the right posterior lung fields scapula , scar over right breast circumareolar   Abdomen: Soft nontender nondistended no rebound no guarding   Skin: See above   Musculoskeletal: Range of motion intact   Psychiatric: Pleasant but sleepy   Neurologic: No asterixis reflexes 2/3 , mentation slightly dulled sensory not examined   Labs on Admission:  Basic Metabolic Panel:  Recent Labs Lab 10/01/13 1327  NA 139  K 3.8  CL 99  CO2 28  GLUCOSE 149*  BUN 41*  CREATININE 2.24*  CALCIUM 13.1*   Liver Function Tests:  Recent Labs Lab 10/01/13 1327  AST 73*  ALT 139*   ALKPHOS 257*  BILITOT 0.3  PROT 7.1  ALBUMIN 3.5   No results found for this basename: LIPASE, AMYLASE,  in the last 168 hours No results found for this basename: AMMONIA,  in the last 168 hours CBC:  Recent Labs Lab 10/01/13 1327  WBC 8.2  NEUTROABS 4.4  HGB 11.1*  HCT 33.5*  MCV 91.0  PLT 188   Cardiac Enzymes: No results found for this basename: CKTOTAL, CKMB, CKMBINDEX, TROPONINI,  in the last 168 hours  BNP (last 3 results) No results found for this basename: PROBNP,  in the last 8760 hours CBG:  Recent Labs Lab 10/01/13 1336  GLUCAP 134*    Radiological Exams on Admission: Dg Chest 2 View  10/01/2013   CLINICAL DATA:  Fatigue.  Weakness.  EXAM: CHEST  2 VIEW  COMPARISON:  08/14/2013.  08/22/2012.  FINDINGS: Improved RIGHT middle lobe airspace disease compatible with treated pneumonia. Chronic pulmonary parenchymal scarring and postsurgical changes. Cardiopericardial silhouette appears within normal limits. Chronic volume loss in the LEFT lung and elevation of the LEFT hemidiaphragm.  IMPRESSION: Improved RIGHT middle lobe airspace disease consistent with treated pneumonia. Lower lung volumes than on prior. Chronic pulmonary parenchymal scarring.   Electronically Signed   By: Dereck Ligas M.D.   On: 10/01/2013 14:43    EKG: Independently reviewed. Sinus rhythm PR interval 0.12 QRS axis 70 , no ST-T wave changes   Assessment/Plan Principal Problem:   Toxic metabolic encephalopathy-etiology unclear.  She has several lab abnormalities including elevated calcium 13, alkaline phosphatase 257, ALT 139, AST 73.  I think this could represent either a subclinical infection or some type of undiagnosed endocrine/oncologic process-she has also history of breast cancer stage I and a history of Barrett's esophagus. I will repeat a basic metabolic panel stat and repeat the calcium--she is on calcitriol so this is probably the etiology of the elevated calcium I have asked for a  ammonia level to be drawn in view of her somnolence and asterixis Her GFR has declined significantly since earlier this year, so I wonder whether her chronic opiate medications may be playing a role as well -I have cut back her OxyContin 10 mg every 8-every 12, likewise cut back her lorazepam 1 mg every 8-every 12 I will hydrate her at 125 cc per hour We'll repeat labs again in the morning and I will review her labs later this evening If we do not find a source it is very reasonable to scan her abdomen and maybe even consult gastroenterology depending on what we find on that scan Active Problems:   ANXIETY-see above discussion-cut back Ativan to every 12 hourly from every 8, we will cut back her brintellix from 20-10 mg daily as major adverse effect of this is nausea and vomiting   TOBACCO USE DISORDER/SMOKER-SMOKING CESSATION DISCUSSED-outpatient cessation discussion has been done   MIGRAINE VARIANTS, W/INTRACTABLE MIGRAINE-see above discussion   COPD-see above discussion   FIBROMYALGIA-see above discussion-stable   Barrett's esophagus   Opioid dependence-have explained very clear to the patient that we will need to cut back some of her medications and she understands   Code Status: Full   Family Communication: Discussed in detail with daughter at bedside Ivin Booty [phone]  ( Disposition Plan: Inpatient, MedSurg   Time spent: [place], [facility] Pager [phone]  If 7PM-7AM, please contact night-coverage [email or web address] Password [facility] 10/01/2013, 3:15 PM

## 2013-10-02 ENCOUNTER — Inpatient Hospital Stay (HOSPITAL_COMMUNITY): Payer: Medicare Other

## 2013-10-02 ENCOUNTER — Encounter (HOSPITAL_COMMUNITY): Payer: Self-pay | Admitting: Physician Assistant

## 2013-10-02 DIAGNOSIS — K838 Other specified diseases of biliary tract: Secondary | ICD-10-CM | POA: Diagnosis present

## 2013-10-02 DIAGNOSIS — E43 Unspecified severe protein-calorie malnutrition: Secondary | ICD-10-CM | POA: Diagnosis present

## 2013-10-02 LAB — CBC
HCT: 28.4 % — ABNORMAL LOW (ref 36.0–46.0)
Hemoglobin: 9.4 g/dL — ABNORMAL LOW (ref 12.0–15.0)
MCH: 29.9 pg (ref 26.0–34.0)
MCHC: 33.1 g/dL (ref 30.0–36.0)
MCV: 90.4 fL (ref 78.0–100.0)
Platelets: 133 10*3/uL — ABNORMAL LOW (ref 150–400)
RBC: 3.14 MIL/uL — ABNORMAL LOW (ref 3.87–5.11)
RDW: 14.9 % (ref 11.5–15.5)
WBC: 5 10*3/uL (ref 4.0–10.5)

## 2013-10-02 LAB — COMPREHENSIVE METABOLIC PANEL
ALK PHOS: 212 U/L — AB (ref 39–117)
ALT: 101 U/L — AB (ref 0–35)
AST: 64 U/L — ABNORMAL HIGH (ref 0–37)
Albumin: 2.8 g/dL — ABNORMAL LOW (ref 3.5–5.2)
Anion gap: 12 (ref 5–15)
BILIRUBIN TOTAL: 0.2 mg/dL — AB (ref 0.3–1.2)
BUN: 35 mg/dL — AB (ref 6–23)
CHLORIDE: 107 meq/L (ref 96–112)
CO2: 24 meq/L (ref 19–32)
Calcium: 10.8 mg/dL — ABNORMAL HIGH (ref 8.4–10.5)
Creatinine, Ser: 1.97 mg/dL — ABNORMAL HIGH (ref 0.50–1.10)
GFR calc non Af Amer: 25 mL/min — ABNORMAL LOW (ref >90)
GFR, EST AFRICAN AMERICAN: 29 mL/min — AB (ref >90)
GLUCOSE: 73 mg/dL (ref 70–99)
Potassium: 3.8 mEq/L (ref 3.7–5.3)
SODIUM: 143 meq/L (ref 137–147)
Total Protein: 5.7 g/dL — ABNORMAL LOW (ref 6.0–8.3)

## 2013-10-02 LAB — URINE DRUGS OF ABUSE SCREEN W ALC, ROUTINE (REF LAB)
Amphetamine Screen, Ur: NEGATIVE
BENZODIAZEPINES.: NEGATIVE
Barbiturate Quant, Ur: NEGATIVE
COCAINE METABOLITES: NEGATIVE
Creatinine,U: 57.5 mg/dL
Ethyl Alcohol: <10 mg/dL (ref <10)
METHADONE: NEGATIVE
Marijuana Metabolite: NEGATIVE
OPIATE SCREEN, URINE: NEGATIVE
Phencyclidine (PCP): NEGATIVE
Propoxyphene: NEGATIVE

## 2013-10-02 LAB — GLUCOSE, CAPILLARY
GLUCOSE-CAPILLARY: 250 mg/dL — AB (ref 70–99)
GLUCOSE-CAPILLARY: 211 mg/dL — AB (ref 70–99)
Glucose-Capillary: 70 mg/dL (ref 70–99)
Glucose-Capillary: 84 mg/dL (ref 70–99)

## 2013-10-02 LAB — PROTIME-INR
INR: 0.95 (ref 0.00–1.49)
PROTHROMBIN TIME: 12.7 s (ref 11.6–15.2)

## 2013-10-02 MED ORDER — BOOST PLUS PO LIQD
237.0000 mL | Freq: Three times a day (TID) | ORAL | Status: DC
  Administered 2013-10-02 – 2013-10-07 (×10): 237 mL via ORAL
  Filled 2013-10-02 (×26): qty 237

## 2013-10-02 MED ORDER — PROCHLORPERAZINE MALEATE 10 MG PO TABS
10.0000 mg | ORAL_TABLET | Freq: Three times a day (TID) | ORAL | Status: DC | PRN
  Administered 2013-10-02 – 2013-10-07 (×7): 10 mg via ORAL
  Filled 2013-10-02 (×7): qty 1

## 2013-10-02 MED ORDER — DEXTROSE 5 % IV SOLN
1.0000 g | INTRAVENOUS | Status: DC
  Administered 2013-10-02 – 2013-10-07 (×6): 1 g via INTRAVENOUS
  Filled 2013-10-02 (×6): qty 10

## 2013-10-02 MED ORDER — OXYCODONE HCL ER 10 MG PO T12A
10.0000 mg | EXTENDED_RELEASE_TABLET | Freq: Three times a day (TID) | ORAL | Status: DC
  Administered 2013-10-02 – 2013-10-07 (×15): 10 mg via ORAL
  Filled 2013-10-02 (×16): qty 1

## 2013-10-02 NOTE — Progress Notes (Signed)
Utilization review completed. Taevin Mcferran, RN, BSN. 

## 2013-10-02 NOTE — Progress Notes (Signed)
INITIAL NUTRITION ASSESSMENT  DOCUMENTATION CODES Per approved criteria  -Severe malnutrition in the context of chronic illness -Underweight   INTERVENTION:  Boost Plus PO TID to maximize oral intake, each supplement provides 360 kcals and 14 gm protein.  NUTRITION DIAGNOSIS: Malnutrition related to inadequate oral intake as evidenced by 13% weight loss in 6 months with severe depletion of muscle mass.   Goal: Intake to meet >90% of estimated nutrition needs.  Monitor:  PO intake, labs, weight trend.  Reason for Assessment: MST  68 y.o. female  Admitting Dx: Toxic metabolic encephalopathy  ASSESSMENT: Patient presented from primary care physician's office with concerns of 2-3 week history increasing somnolence, poor by mouth appetite, nausea vomiting. Hx of chronic pain, fibromyalgia, type 1 DM, ETOH pancreatitis, pancreatectomy, COPD, GERD, Barrett's esophagus, cholecystectomy.   Over the past 2 weeks, she has not been eating, has had increased somnolence [sleeping 18 hours a day], failure to thrive as well as increasing cough with green mucus.   She was prescribed Augmentin for presumed aspiration pneumonia  at physician's office 08/14/2013 .  Patient reports that her usual weight is 125 lb, unsure how long ago she weighed that. She has been eating poorly for the past 2 weeks due to nausea, vomiting, and coughing up "green stuff." She likes chocolate Boost.   Nutrition Focused Physical Exam:  Subcutaneous Fat:  Orbital Region: WNL Upper Arm Region: mild depletion Thoracic and Lumbar Region: WNL  Muscle:  Temple Region: mild depletion Clavicle Bone Region: mild depletion Clavicle and Acromion Bone Region: mild depletion Scapular Bone Region: WNL Dorsal Hand: WNL Patellar Region: severe depletion Anterior Thigh Region: severe depletion Posterior Calf Region: severe depletion  Edema: none  Pt meets criteria for severe MALNUTRITION in the context of chronic illness  as evidenced by 13% weight loss in 6 months and severe depletion of muscle mass.  Height: Ht Readings from Last 1 Encounters:  10/01/13 5\' 1"  (1.549 m)    Weight: Wt Readings from Last 1 Encounters:  10/02/13 90 lb 6.2 oz (41 kg)    Ideal Body Weight: 47.7 kg  % Ideal Body Weight: 86%  Wt Readings from Last 10 Encounters:  10/02/13 90 lb 6.2 oz (41 kg)  10/01/13 90 lb 8 oz (41.051 kg)  09/30/13 91 lb (41.277 kg)  09/10/13 91 lb (41.277 kg)  08/14/13 94 lb 1.9 oz (42.693 kg)  07/17/13 95 lb (43.092 kg)  07/01/13 95 lb (43.092 kg)  06/19/13 98 lb 8 oz (44.679 kg)  05/15/13 101 lb (45.813 kg)  04/17/13 103 lb (46.72 kg)    Usual Body Weight: 103 lb (6 months ago)  % Usual Body Weight: 87%  BMI:  Body mass index is 17.09 kg/(m^2). Underweight  Estimated Nutritional Needs: Kcal: 1400-1600 Protein: 70-80 gm Fluid: 1.5 L  Skin: lacerations to head and left elbow  Diet Order: Carb Control  EDUCATION NEEDS: -Education needs addressed   Intake/Output Summary (Last 24 hours) at 10/02/13 0924 Last data filed at 10/01/13 1835  Gross per 24 hour  Intake 1616.67 ml  Output    300 ml  Net 1316.67 ml    Last BM: None documented since admission    Labs:   Recent Labs Lab 10/01/13 1327 10/01/13 1524 10/02/13 0544  NA 139 141 143  K 3.8 3.7 3.8  CL 99 104 107  CO2 28 27 24   BUN 41* 40* 35*  CREATININE 2.24* 2.13* 1.97*  CALCIUM 13.1* 11.9* 10.8*  GLUCOSE 149* 93 73  CBG (last 3)   Recent Labs  10/01/13 1848 10/01/13 2049 10/02/13 0753  GLUCAP 84 198* 70    Scheduled Meds: . atenolol  100 mg Oral [id number]  . cefTRIAXone (ROCEPHIN)  IV  1 g Intravenous Q24H  . heparin  5,000 Units Subcutaneous 3 times per day  . hydrocortisone  10 mg Oral BID  . insulin aspart  0-9 Units Subcutaneous TID WC  . insulin detemir  8 Units Subcutaneous QHS  . nicotine  21 mg Transdermal Daily  . OxyCODONE  10 mg Oral 3 times per day  . pantoprazole  40 mg Oral  Daily  . tamoxifen  20 mg Oral BID  . thiamine  100 mg Intravenous Daily  . Vortioxetine HBr  10 mg Oral Daily    Continuous Infusions: . sodium chloride 1,000 mL (10/02/13 3545)    Past Medical History  Diagnosis Date  . Depression   . Type II or unspecified type diabetes mellitus without mention of complication, not stated as uncontrolled   . HTN (hypertension)   . Chronic pain   . Fibromyalgia   . Migraines   . History of breast cancer     Right  . Chronic pancreatitis   . Vitamin D deficiency   . GERD (gastroesophageal reflux disease)   . Finger dislocation 2009    L 3rd  . COPD (chronic obstructive pulmonary disease)   . Anemia   . Fractured sternum 11/2008  . Osteoporosis     Reclast too expensive  . Barrett's esophagus   . Chronic fatigue   . Diverticulosis   . Opioid dependence   . Cancer     HX OF BREAST CANCER     Past Surgical History  Procedure Laterality Date  . Nasal sinus surgery    . Pancreatectomy      40%  . Cholecystectomy    . Appendectomy    . Abdominal hysterectomy    . Lobectomy Left   . Breast lumpectomy Left     Molli Barrows, RD, LDN, [place] Pager [phone] After Hours Pager [phone]

## 2013-10-02 NOTE — Consult Note (Signed)
[facility] Consult: 12:48 PM 10/02/2013  LOS: 1 day    Referring Provider: Verlon Au Primary Care Physician:  Walker Kehr, MD Primary Gastroenterologist:  Dr. Deatra Ina    Reason for Consultation:  Dilated bile duct   HPI: Lindsey Gomez is a 68 y.o. 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    The patients family states that the patient has "ups and downs" in her health, and had a recent diagnosis of aspirational PNA in June of 2015 from which she does not seem to have recovered.  For the past 3 weeks, pt has been more weak than usual and with some confusion.  She has had a decrease in appetite leading to having a diet of primarily Boost supplemental drinks.  She reports not having a BM for 3 weeks, apart from a small "ball" of fecal matter on 10/01/13. She has occasional N/V with some stomach contents and green "mucous". She also has had a cough since her PNA episode and reports some green mucous production with this as well. Pt also endorses dysphagia with a "sticking" feeling when attempting to eat solids, and lately difficulties with liquids as well. She has had a 35 pound weight loss over the past three months, and has chills without fever.    Pts daughter also reports she had a tick removed a couple of weeks ago, but has had no rash or other symptoms.  Pt also had a fall on 09/30/13 but ER work up was negative for any acute cranial process.   Past Medical History  Diagnosis Date  . Depression   . Type II or  unspecified type diabetes mellitus without mention of complication, not stated as uncontrolled   . HTN (hypertension)   . Chronic pain   . Fibromyalgia   . Migraines   . History of breast cancer     Right  . Chronic pancreatitis   . Vitamin D deficiency   . GERD (gastroesophageal reflux disease)   . Finger dislocation 2009    L 3rd  . COPD (chronic obstructive pulmonary disease)   . Anemia   . Fractured sternum 11/2008  . Osteoporosis     Reclast too expensive  . Barrett's esophagus   . Chronic fatigue   . Diverticulosis   . Opioid dependence   . Cancer     HX OF BREAST CANCER     Past Surgical History  Procedure Laterality Date  . Nasal sinus surgery    . Pancreatectomy      40%  . Cholecystectomy    . Appendectomy    . Abdominal hysterectomy    . Lobectomy Left   . Breast lumpectomy Left     Prior to Admission medications   Medication Sig Start Date End Date Taking?  Authorizing Provider  amitriptyline (ELAVIL) 50 MG tablet Take 25-50 mg by mouth at bedtime.   Yes Historical Provider, MD  atenolol (TENORMIN) 100 MG tablet Take 1 tablet (100 mg total) by mouth every morning. 05/15/13  Yes Aleksei Plotnikov V, MD  atorvastatin (LIPITOR) 10 MG tablet Take 1 tablet (10 mg total) by mouth daily. 05/15/13 05/18/14 Yes Aleksei Plotnikov V, MD  calcitRIOL (ROCALTROL) 0.5 MCG capsule Take 2 capsules (1 mcg total) by mouth daily. 05/15/13  Yes Aleksei Plotnikov V, MD  docusate sodium (COLACE) 100 MG capsule Take 100 mg by mouth daily as needed for mild constipation.   Yes Historical Provider, MD  eletriptan (RELPAX) 40 MG tablet Take 40 mg by mouth as needed for migraine or headache. One tablet by mouth at onset of headache. May repeat in 2 hours if headache persists or recurs.   Yes Historical Provider, MD  glucagon (GLUCAGON EMERGENCY) 1 MG injection Inject 1 mg into the vein once as needed (for glucose).   Yes Historical Provider, MD  hydrocortisone (CORTEF) 10 MG tablet Take 1 tablet  (10 mg total) by mouth 2 (two) times daily. 05/15/13  Yes Aleksei Plotnikov V, MD  insulin aspart (NOVOLOG) 100 UNIT/ML FlexPen Inject 4-10 Units into the skin 3 (three) times daily with meals. Inject 10 units subq with breakfast, inject 4 units subq with lunch and inject 5 units with evening meal. 01/13/13  Yes Renato Shin, MD  insulin detemir (LEVEMIR) 100 UNIT/ML injection Inject 8 Units into the skin at bedtime.  05/27/13  Yes Renato Shin, MD  LORazepam (ATIVAN) 2 MG tablet Take 2 mg by mouth every 8 (eight) hours as needed for anxiety.   Yes Historical Provider, MD  mometasone (NASONEX) 50 MCG/ACT nasal spray Place 2 sprays into both nostrils daily.   Yes Historical Provider, MD  omeprazole (PRILOSEC) 40 MG capsule Take 1 capsule (40 mg total) by mouth 2 (two) times daily. 07/17/13  Yes Aleksei Plotnikov V, MD  OxyCODONE (OXYCONTIN) 10 mg T12A 12 hr tablet Take 1 tablet (10 mg total) by mouth 3 (three) times daily. 09/10/13  Yes Aleksei Plotnikov V, MD  prochlorperazine (COMPAZINE) 10 MG tablet Take 10 mg by mouth every 8 (eight) hours as needed for nausea or vomiting.   Yes Historical Provider, MD  tamoxifen (NOLVADEX) 20 MG tablet Take 20 mg by mouth 2 (two) times daily.     Yes Historical Provider, MD  topiramate (TOPAMAX) 100 MG tablet Take 0.5 tablets (50 mg total) by mouth 2 (two) times daily. 09/10/13  Yes Aleksei Plotnikov V, MD  triamcinolone (KENALOG) 0.1 % ointment Apply 1 application topically 2 (two) times daily as needed (itching).    Yes Historical Provider, MD  Vortioxetine HBr (BRINTELLIX) 20 MG TABS Take 20 mg by mouth daily. 08/14/13  Yes Aleksei Plotnikov V, MD  zolmitriptan (ZOMIG) 5 MG tablet Take 5 mg by mouth daily as needed for migraine.    Yes Historical Provider, MD    Scheduled Meds: . atenolol  100 mg Oral BH-q7a  . cefTRIAXone (ROCEPHIN)  IV  1 g Intravenous Q24H  . heparin  5,000 Units Subcutaneous 3 times per day  . hydrocortisone  10 mg Oral BID  . insulin aspart  0-9  Units Subcutaneous TID WC  . insulin detemir  8 Units Subcutaneous QHS  . lactose free nutrition  237 mL Oral TID BM  . nicotine  21 mg Transdermal Daily  . OxyCODONE  10 mg Oral 3 times per  day  . pantoprazole  40 mg Oral Daily  . tamoxifen  20 mg Oral BID  . thiamine  100 mg Intravenous Daily  . Vortioxetine HBr  10 mg Oral Daily   Infusions: . sodium chloride 1,000 mL (10/02/13 0821)   PRN Meds: LORazepam, oxyCODONE, prochlorperazine, SUMAtriptan   Allergies as of 10/01/2013 - Review Complete 10/01/2013  Allergen Reaction Noted  . Gabapentin  06/04/2012  . Milnacipran  05/25/2008  . Moxifloxacin  12/11/2006  . Rizatriptan benzoate  11/09/2009  . Sulfonamide derivatives  12/11/2006  . Venlafaxine  05/09/2007    Family History  Problem Relation Age of Onset  . Colon cancer Neg Hx   . COPD Father   . Heart disease Father   . Leukemia Mother   . Diabetes Maternal Uncle     History   Social History  . Marital Status: Married    Spouse Name: [name]    Number of Children: N/A  . Years of Education: N/A   Occupational History  . 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 Smoking status: Current Every Day Smoker -- 2.00 packs/day for 40 years    Types: Cigarettes  . Smokeless tobacco: Never Used  . Alcohol Use: No  . Drug Use: No  . Sexual Activity: No   Other Topics Concern  . Not on file   Social History Narrative   Did not go back to work Oct 23, [name] is very ill - unable to have financial ends meet, no income      Regular Exercise -  NO      Coffee daily     REVIEW OF SYSTEMS: Constitutional:  + Weight loss, fatigue, weakness. ENT:  No nose bleeds Pulm:  Productive Cough (Green), no SOB. CV:  No palpitations, no LE edema.  No CP. GU:  No hematuria, no frequency GI:  Constipation.  No blood in stool or vomit. Neuro:  No headaches, no peripheral tingling or numbness Derm:  No itching, no rash or sores.  Endocrine:  + chills.  No sweats.  No polyuria or dysuria Immunization:  Up to date per pt and family. Travel:  None beyond local counties in last few months.    PHYSICAL EXAM: Vital signs in last 24 hours: Filed Vitals:   10/02/13 0500  BP: 138/75  Pulse: 74  Temp: 98.5 F (36.9 C)  Resp: 16   Wt Readings from Last 3 Encounters:  10/02/13 90 lb 6.2 oz (41 kg)  10/01/13 90 lb 8 oz (41.051 kg)  09/30/13 91 lb (41.277 kg)    General: Malnourished female  in NAD HEENT: Normocephalic. Small knot to left parietal s/p fall on 09/30/13.  Without mass or lesion.  Membranes pink and moist. Lungs:  CTA bilaterally.  Unlabored. Heart: RRR, No MGR noted Abdomen:  Soft, ND.  +BS. Negative Rebound Musc/Skeltl: No gross abnormalities Extremities: No peripheral edema.  2+ DP and radial pulses. Neurologic:  A&O to person, place, and time. Skin:  Without rash Psych:  Normal mood and affect.  Cooperative.  Intake/Output from previous day: 07/23 0701 - 07/24 0700 In: 1616.7 [I.V.:1616.7] Out: 300 [Urine:300] Intake/Output this shift:    LAB RESULTS:  Recent Labs  10/01/13 1327 10/02/13 0544  WBC 8.2 5.0  HGB 11.1* 9.4*  HCT 33.5* 28.4*  PLT 188 133*   BMET Lab Results  Component Value Date   NA 143 10/02/2013   NA 141 10/01/2013  NA 139 10/01/2013   K 3.8 10/02/2013   K 3.7 10/01/2013   K 3.8 10/01/2013   CL 107 10/02/2013   CL 104 10/01/2013   CL 99 10/01/2013   CO2 24 10/02/2013   CO2 27 10/01/2013   CO2 28 10/01/2013   GLUCOSE 73 10/02/2013   GLUCOSE 93 10/01/2013   GLUCOSE 149* 10/01/2013   BUN 35* 10/02/2013   BUN 40* 10/01/2013   BUN 41* 10/01/2013   CREATININE 1.97* 10/02/2013   CREATININE 2.13* 10/01/2013   CREATININE 2.24* 10/01/2013   CALCIUM 10.8* 10/02/2013   CALCIUM 11.9* 10/01/2013   CALCIUM 13.1* 10/01/2013   LFT  Recent Labs  10/01/13 1327 10/02/13 0544  PROT 7.1 5.7*  ALBUMIN 3.5 2.8*  AST 73* 64*  ALT 139* 101*  ALKPHOS 257* 212*  BILITOT 0.3 0.2*   PT/INR Lab Results    Component Value Date   INR 0.95 10/02/2013   INR 0.9 09/01/2010   INR 0.8 RATIO 12/03/2006   Lipase     Component Value Date/Time   LIPASE 76.0* 09/10/2013 1010    Drugs of Abuse     Component Value Date/Time   LABOPIA NEGATIVE 10/01/2013 1519   LABOPIA NONE DETECTED 08/22/2012 1825   COCAINSCRNUR NEGATIVE 10/01/2013 1519   COCAINSCRNUR NONE DETECTED 08/22/2012 1825   LABBENZ NEGATIVE 10/01/2013 1519   LABBENZ NONE DETECTED 08/22/2012 1825   AMPHETMU NEGATIVE 10/01/2013 1519   AMPHETMU NONE DETECTED 08/22/2012 1825   THCU NONE DETECTED 08/22/2012 1825   LABBARB NONE DETECTED 08/22/2012 1825     RADIOLOGY STUDIES: Dg Chest 2 View  10/01/2013   CLINICAL DATA:  Fatigue.  Weakness.  EXAM: CHEST  2 VIEW  COMPARISON:  08/14/2013.  08/22/2012.  FINDINGS: Improved RIGHT middle lobe airspace disease compatible with treated pneumonia. Chronic pulmonary parenchymal scarring and postsurgical changes. Cardiopericardial silhouette appears within normal limits. Chronic volume loss in the LEFT lung and elevation of the LEFT hemidiaphragm.  IMPRESSION: Improved RIGHT middle lobe airspace disease consistent with treated pneumonia. Lower lung volumes than on prior. Chronic pulmonary parenchymal scarring.   Electronically Signed   By: Dereck Ligas M.D.   On: 10/01/2013 14:43   Ct Head Wo Contrast  10/01/2013   CLINICAL DATA:  Fatigue and weakness  EXAM: CT HEAD WITHOUT CONTRAST  TECHNIQUE: Contiguous axial images were obtained from the base of the skull through the vertex without intravenous contrast.  COMPARISON:  CT head 05/06/2013  FINDINGS: Generalized atrophy with mild chronic microvascular ischemia  Negative for acute infarct.  Negative for hemorrhage or mass.  Extensive sinus surgery on the left. Extensive bony thickening of the left maxillary sinus.  IMPRESSION: Atrophy and mild chronic microvascular ischemia. No acute abnormality.   Electronically Signed   By: Franchot Gallo M.D.   On: 10/01/2013 15:18    US Abdomen Complete  10/02/2013   CLINICAL DATA:  Abdominal pain.  History of a cholecystectomy.  EXAM: ULTRASOUND ABDOMEN COMPLETE  COMPARISON:  CT, 10/13/2003  FINDINGS: Gallbladder:  Surgically removed  Common bile duct:  Diameter: 15.9 mm. No duct stone is seen. Most distal aspect of the duct was not visualized.  Liver:  Intrahepatic bile duct dilation. Liver normal in size in echogenicity. No liver mass. Normal hepatopetal flow in the portal vein.  IVC:  No abnormality visualized.  Pancreas:  Not well visualized. Much of the lower pancreatic head was obscured by bowel gas.  Spleen:  Size and appearance within normal limits.  Right Kidney:  Length:  11.6 cm. Normal parenchymal echogenicity. Diffuse cortical thinning. Small cyst with some internal echoes in the lower pole measuring 1 cm. No other renal masses. No hydronephrosis.  Left Kidney:  Length: 11.5 cm. There is cortical thinning. Normal parenchymal echogenicity. 10 mm exophytic cysts from the lateral margin of the mid to lower pole with a thin septation. 11 mm simple appearing upper pole cyst. No other renal masses. No hydronephrosis.  Abdominal aorta:  No aneurysm visualized.  Other findings:  None.  IMPRESSION: 1. There is significant intern extrahepatic bile duct dilation of unclear etiology. The duct measures 15.9 mm in diameter, which is a significant increase in caliber from the prior CT. The most distal aspect of the duct, and much of the pancreatic head, was not visualized due to overlying bowel gas. A distal duct stone or a pancreatic mass is not excluded. Recommend followup ERCP or MRI/MRCP. If MRI is chosen, recommend pancreatic protocol MRI with contrast with MRCP. 2. Status post cholecystectomy. 3. Small renal cysts and bilateral renal cortical thinning. 4. No other abnormalities.   Electronically Signed   By: Lajean Manes M.D.   On: 10/02/2013 11:52   Dg Abd 2 Views  10/01/2013   CLINICAL DATA:  History of pancreatectomy, fatigue   EXAM: ABDOMEN - 2 VIEW  COMPARISON:  None.  FINDINGS: Moderate amount of stool throughout the colon. Surgical clips are scattered primarily in the left upper quadrant. There is no bowel dilatation to suggest obstruction. There is no evidence of pneumoperitoneum, portal venous gas or pneumatosis. There are no pathologic calcifications along the expected course of the ureters.  Mild osteoarthritis of bilateral hips.  IMPRESSION: No bowel obstruction.  Moderate amount of stool within the colon.   Electronically Signed   By: Kathreen Devoid   On: 10/01/2013 16:13    ENDOSCOPIC STUDIES: EGD by Dr Erskine Emery Sep 26, 2010 with Barret's Esophagus Normal Colonoscopy 07/27/2004 by Dr. Rachelle Hora  IMPRESSION:   * Dilated Bile Duct - May be 2/2 to stone or other obstructive process. MRCP will give better idea.  May need ERCP if stone found. * Hypercalcemia - Corrected since entry. May have been contributing or causing of her symptoms as she has seen some improvement since admission. * Constipation - Chronic 2/2 opiate use.  No obstructive process noted on ABD x ray on 10/01/13.  Due for Colonoscopy but may benefit from other imaging (CT or Barium Enema) in the meantime. * Dysphagia - Hx of Barret's with no f/u after most recent EGD on Sep 25, 2013.  Pt may benefit from follow up EGD given symptoms.  PLAN:     - MRCP ordered for better visualization of stone vs mass. Will need ERCP if + for stones. - Recommended date for repeat colonoscopy was 2012.  Will consult with MD Outpatient vs Inpatient.   Lollie Marrow  10/02/2013, 12:48 PM Lawrence Marseilles, PA-Student [place] Pager: [phone]  [facility] Attending  I have also seen and assessed the patient and agree with the above note. I have personally seen the patient, reviewed and repeated key elements of the history and physical and participated in formation of the assessment and plan the student has documented.  Gatha Mayer, MD, [facility]  Gastroenterology [phone] (pager) 10/02/2013 4:04 PM

## 2013-10-02 NOTE — Progress Notes (Signed)
Note: This document was prepared with digital dictation and possible smart phrase technology. Any transcriptional errors that result from this process are unintentional.   Lindsey Gomez YQM:[id number] DOB: 04/08/1945 DOA: 10/01/2013 PCP: Walker Kehr, MD  Brief narrative:  68 y/o ?, known well documented chronic pain on opiates, fibromyalgia ty 1 DM, Prior ETOH pancreatitis c h/o Pancreatectomy, migraine, COPD,Gerd+ prior history Barrett's esophagus 09/05/2010 follwed by Dr. Deatra Ina, prior h/o Breast CA? 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 05/09/2005  Presented from primary care physician's office with concerns of 2-3 week history increasing somnolence, poor by mouth appetite, nausea vomiting She was found to have elevated calcium in setting chronic Vit D replacement, also new onset deranged LFT's without source  Past medical history-As per Problem list Chart reviewed as below-   Consultants:  GI  Procedures:  CXR  Antibiotics:  Ceftriaxone 7/24   Subjective   Denies dysuria or frequency Sitting at bedside when she is about to eat she proceeds to cough for a out 2 minutes and says she brought up "green sputum" which i do not witness No CP or SOB NO fevers   Objective    Interim History: none  Telemetry:  non tele   Objective: Filed Vitals:   10/01/13 1646 10/01/13 1812 10/01/13 2047 10/02/13 0500  BP:  145/86 139/78 138/75  Pulse:  67 68 74  Temp: 97.9 F (36.6 C) 98 F (36.7 C) 98.7 F (37.1 C) 98.5 F (36.9 C)  TempSrc: Oral Oral Oral Oral  Resp:  16 16 16   Height:  5\' 1"  (1.549 m)    Weight:  41.051 kg (90 lb 8 oz)  41 kg (90 lb 6.2 oz)  SpO2:  99% 96% 97%    Intake/Output Summary (Last 24 hours) at 10/02/13 0849 Last data filed at 10/01/13 1835  Gross per 24 hour  Intake 1616.67 ml  Output    300 ml    Net 1316.67 ml    Exam:  General: much more alert, pleasan tin NAD currently Cardiovascular: s1s 2 no m/r/g Respiratory: clear, no added sound Abdomen: soft, mild tenderness in central abdomen Skin no le edema   Data Reviewed: Basic Metabolic Panel:  Recent Labs Lab 10/01/13 1327 10/01/13 1524 10/02/13 0544  NA 139 141 143  K 3.8 3.7 3.8  CL 99 104 107  CO2 28 27 24   GLUCOSE 149* 93 73  BUN 41* 40* 35*  CREATININE 2.24* 2.13* 1.97*  CALCIUM 13.1* 11.9* 10.8*   Liver Function Tests:  Recent Labs Lab 10/01/13 1327 10/02/13 0544  AST 73* 64*  ALT 139* 101*  ALKPHOS 257* 212*  BILITOT 0.3 0.2*  PROT 7.1 5.7*  ALBUMIN 3.5 2.8*   No results found for this basename: LIPASE, AMYLASE,  in the last 168 hours  Recent Labs Lab 10/01/13 1524  AMMONIA 21   CBC:  Recent Labs Lab 10/01/13 1327 10/02/13 0544  WBC 8.2 5.0  NEUTROABS 4.4  --   HGB 11.1* 9.4*  HCT 33.5* 28.4*  MCV 91.0 90.4  PLT 188 133*   Cardiac Enzymes: No results found for this basename: CKTOTAL, CKMB, CKMBINDEX, TROPONINI,  in the last 168 hours BNP: No components found with this basename: POCBNP,  CBG:  Recent Labs Lab 10/01/13 1336 10/01/13 1848 10/01/13 2049 10/02/13 0753  GLUCAP 134* 84 198* 70    No results found for this  or any previous visit (from the past 240 hour(s)).   Studies:              All Imaging reviewed and is as per above notation   Scheduled Meds: . atenolol  100 mg Oral BH-q7a  . cefTRIAXone (ROCEPHIN)  IV  1 g Intravenous Q24H  . heparin  5,000 Units Subcutaneous 3 times per day  . hydrocortisone  10 mg Oral BID  . insulin aspart  0-9 Units Subcutaneous TID WC  . insulin detemir  8 Units Subcutaneous QHS  . nicotine  21 mg Transdermal Daily  . OxyCODONE  10 mg Oral Q12H  . pantoprazole  40 mg Oral Daily  . tamoxifen  20 mg Oral BID  . thiamine  100 mg Intravenous Daily  . Vortioxetine HBr  10 mg Oral Daily   Continuous Infusions: . sodium  chloride 1,000 mL (10/02/13 0821)     Assessment/Plan:  1. Metabolic  Encephalopathy-ammonia no elevated . component of chronic pain medications, mild acute kidney injury, potential urinary tract infection although asymptomatic. Improving . See below 2. ? Urinary tract infection/pyelonephritis-no fever no chills and no dysuria however had many leukocytes on UA. Cover with ceftriaxone 1 g every 24 for now.  Follow urine culture-note that she had 3000 colony-forming units of staph 08/14/13 3. Chronic pain-patient in no significant pain currently and seems comfortable however asking for chronic meds.  Have re\re explained to her again today rationale-increase OxyContin every 12-->q. 8 will not increase either her short acting oxycodone 5 twice a day on her Ativan for right now. 4. Cough-unclear etiology-? Bronchitis from chronic smoking 5. Posttussive nausea-component of her antidepressant Brintellix which was cut back from 20-10 mg on admission.  We may need to discontinued this.  Given her altered LFTs as well as history of Barrett's esophagus, depending on ultrasound abdomen pelvis ordered 7/24 will consult GI 6.  Fibromyalgia-see above discussion 7. Anxiety see above discussion 8. History breast cancer on tamoxifen-monitor. Need to recheck mammogram as an outpatient 9. Hypercalcemia-was on vitamin D which makes specific diagnosis of this and more challenging. Her corrected calcium today is 11.8 we will continue IV fluids for now and monitor.   if this remains high we will get a PTH as well as a iPTH  in the next couple of days  10.  hypertension continue atenolol 50 daily 11. Diabetes mellitus type 2 status post pancreatectomy-continue insulin 8 units each bedtime, sliding scale coverage 3 times a day with meals  Code Status: Full  Family Communication: Call her daughter Marvene Staff [phone] Disposition Plan: inptient   [name], MD  [facility] Pager [phone] 10/02/2013,  8:49 AM    LOS: 1 day

## 2013-10-02 NOTE — Progress Notes (Signed)
Pt requested chaplain to come to room to explain AD. Chaplain took a copy of AD to pt room and went over each page in detailed. Pt informed chaplain that she is waiting on daughter to come and that they will complete it and will have nurse to call chaplain to have it notarized.  Charyl Dancer, Chaplain

## 2013-10-03 LAB — COMPREHENSIVE METABOLIC PANEL
ALT: 67 U/L — ABNORMAL HIGH (ref 0–35)
AST: 31 U/L (ref 0–37)
Albumin: 2.5 g/dL — ABNORMAL LOW (ref 3.5–5.2)
Alkaline Phosphatase: 178 U/L — ABNORMAL HIGH (ref 39–117)
Anion gap: 13 (ref 5–15)
BUN: 28 mg/dL — AB (ref 6–23)
CHLORIDE: 109 meq/L (ref 96–112)
CO2: 23 mEq/L (ref 19–32)
Calcium: 9.6 mg/dL (ref 8.4–10.5)
Creatinine, Ser: 1.73 mg/dL — ABNORMAL HIGH (ref 0.50–1.10)
GFR calc Af Amer: 34 mL/min — ABNORMAL LOW (ref >90)
GFR calc non Af Amer: 30 mL/min — ABNORMAL LOW (ref >90)
Glucose, Bld: 112 mg/dL — ABNORMAL HIGH (ref 70–99)
Potassium: 2.9 mEq/L — CL (ref 3.7–5.3)
Sodium: 145 mEq/L (ref 137–147)
TOTAL PROTEIN: 5.4 g/dL — AB (ref 6.0–8.3)

## 2013-10-03 LAB — CBC WITH DIFFERENTIAL/PLATELET
Basophils Absolute: 0 10*3/uL (ref 0.0–0.1)
Basophils Relative: 1 % (ref 0–1)
EOS ABS: 0.1 10*3/uL (ref 0.0–0.7)
EOS PCT: 2 % (ref 0–5)
HEMATOCRIT: 27.7 % — AB (ref 36.0–46.0)
HEMOGLOBIN: 9.3 g/dL — AB (ref 12.0–15.0)
Lymphocytes Relative: 42 % (ref 12–46)
Lymphs Abs: 1.9 10*3/uL (ref 0.7–4.0)
MCH: 30.2 pg (ref 26.0–34.0)
MCHC: 33.6 g/dL (ref 30.0–36.0)
MCV: 89.9 fL (ref 78.0–100.0)
MONOS PCT: 7 % (ref 3–12)
Monocytes Absolute: 0.3 10*3/uL (ref 0.1–1.0)
Neutro Abs: 2.2 10*3/uL (ref 1.7–7.7)
Neutrophils Relative %: 49 % (ref 43–77)
Platelets: 118 10*3/uL — ABNORMAL LOW (ref 150–400)
RBC: 3.08 MIL/uL — ABNORMAL LOW (ref 3.87–5.11)
RDW: 14.7 % (ref 11.5–15.5)
WBC: 4.4 10*3/uL (ref 4.0–10.5)

## 2013-10-03 LAB — GLUCOSE, CAPILLARY
GLUCOSE-CAPILLARY: 101 mg/dL — AB (ref 70–99)
Glucose-Capillary: 246 mg/dL — ABNORMAL HIGH (ref 70–99)
Glucose-Capillary: 257 mg/dL — ABNORMAL HIGH (ref 70–99)
Glucose-Capillary: 234 mg/dL — ABNORMAL HIGH (ref 70–99)

## 2013-10-03 LAB — MAGNESIUM: Magnesium: 1.9 mg/dL (ref 1.5–2.5)

## 2013-10-03 MED ORDER — POTASSIUM CHLORIDE CRYS ER 20 MEQ PO TBCR
40.0000 meq | EXTENDED_RELEASE_TABLET | Freq: Two times a day (BID) | ORAL | Status: DC
  Administered 2013-10-03 – 2013-10-05 (×6): 40 meq via ORAL
  Filled 2013-10-03 (×10): qty 2

## 2013-10-03 MED ORDER — POTASSIUM CHLORIDE 10 MEQ/100ML IV SOLN
10.0000 meq | INTRAVENOUS | Status: AC
Start: 2013-10-03 — End: 2013-10-03
  Administered 2013-10-03 (×3): 10 meq via INTRAVENOUS
  Filled 2013-10-03 (×3): qty 100

## 2013-10-03 MED ORDER — AMITRIPTYLINE HCL 25 MG PO TABS
25.0000 mg | ORAL_TABLET | Freq: Every day | ORAL | Status: DC
  Administered 2013-10-03 – 2013-10-04 (×2): 25 mg via ORAL
  Administered 2013-10-05: 50 mg via ORAL
  Administered 2013-10-06: 25 mg via ORAL
  Filled 2013-10-03 (×5): qty 2

## 2013-10-03 MED ORDER — TOPIRAMATE 25 MG PO TABS
50.0000 mg | ORAL_TABLET | Freq: Two times a day (BID) | ORAL | Status: DC
  Administered 2013-10-03 – 2013-10-07 (×9): 50 mg via ORAL
  Filled 2013-10-03 (×10): qty 2

## 2013-10-03 MED ORDER — LACTULOSE 10 GM/15ML PO SOLN: 20.0000 g | Freq: Two times a day (BID) | ORAL | Status: DC

## 2013-10-03 MED ORDER — SORBITOL 70 % SOLN
30.0000 mL | Freq: Two times a day (BID) | Status: DC
  Administered 2013-10-03 – 2013-10-05 (×2): 30 mL via ORAL
  Filled 2013-10-03 (×6): qty 30

## 2013-10-03 NOTE — Progress Notes (Signed)
Lab called with critical value. K 2.9 with am labs. Patient has no complaints of distress. On-call provider notified. Fredirick Maudlin, NP returned page and placed orders. Will continue to monitor.

## 2013-10-03 NOTE — Progress Notes (Addendum)
Note: This document was prepared with digital dictation and possible smart phrase technology. Any transcriptional errors that result from this process are unintentional.   Lindsey Gomez YSA:[id number] DOB: [birth year] DOA: 10/01/2013 PCP: Walker Kehr, MD  Brief narrative:  68 y/o ?, known well documented chronic pain on opiates, fibromyalgia ty 1 DM, Prior ETOH pancreatitis c h/o Pancreatectomy, migraine, COPD,Gerd+ prior history Barrett's esophagus 09/05/2010 follwed by Dr. Deatra Ina, prior h/o Breast CA? 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 05/09/2005  Presented from primary care physician's office with concerns of 2-3 week history increasing somnolence, poor by mouth appetite, nausea vomiting She was found to have elevated calcium in setting chronic Vit D replacement, also new onset deranged LFT's without source  Past medical history-As per Problem list Chart reviewed as below-   Consultants:  GI  Procedures:  CXR  Antibiotics:  Ceftriaxone 7/24   Subjective   Doing fair Episodes of nausea this morning Small stool noted only this morning No chest pain or shortness of breath or fever Abdominal pain seems about the same    Objective    Interim History: none  Telemetry:  non tele   Objective: Filed Vitals:   10/02/13 0500 10/02/13 1354 10/02/13 2105 10/03/13 0459  BP: 138/75 159/89 154/79 164/87  Pulse: 74 72 91 94  Temp: 98.5 F (36.9 C) 98.2 F (36.8 C) 98.1 F (36.7 C) 97.9 F (36.6 C)  TempSrc: Oral Oral Oral Oral  Resp: 16 16 16 16   Height:      Weight: 41 kg (90 lb 6.2 oz)   41 kg (90 lb 6.2 oz)  SpO2: 97% 97% 98% 96%    Intake/Output Summary (Last 24 hours) at 10/03/13 0814 Last data filed at 10/02/13 1852  Gross per 24 hour  Intake 3085.42 ml  Output      0 ml  Net 3085.42 ml    Exam:  General:  much more alert, pleasan tin NAD currently Cardiovascular: s1s 2 no m/r/g Respiratory: clear, no added sound Abdomen: soft, mild tenderness in central abdomen Skin no le edema   Data Reviewed: Basic Metabolic Panel:  Recent Labs Lab 10/01/13 1327 10/01/13 1524 10/02/13 0544 10/03/13 0431  NA 139 141 143 145  K 3.8 3.7 3.8 2.9*  CL 99 104 107 109  CO2 28 27 24 23   GLUCOSE 149* 93 73 112*  BUN 41* 40* 35* 28*  CREATININE 2.24* 2.13* 1.97* 1.73*  CALCIUM 13.1* 11.9* 10.8* 9.6  MG  --   --   --  1.9   Liver Function Tests:  Recent Labs Lab 10/01/13 1327 10/02/13 0544 10/03/13 0431  AST 73* 64* 31  ALT 139* 101* 67*  ALKPHOS 257* 212* 178*  BILITOT 0.3 0.2* <0.2*  PROT 7.1 5.7* 5.4*  ALBUMIN 3.5 2.8* 2.5*   No results found for this basename: LIPASE, AMYLASE,  in the last 168 hours  Recent Labs Lab 10/01/13 1524  AMMONIA 21   CBC:  Recent Labs Lab 10/01/13 1327 10/02/13 0544 10/03/13 0431  WBC 8.2 5.0 4.4  NEUTROABS 4.4  --  2.2  HGB 11.1* 9.4* 9.3*  HCT 33.5* 28.4* 27.7*  MCV 91.0 90.4 89.9  PLT 188 133* 118*   Cardiac Enzymes: No results found for this basename: CKTOTAL, CKMB, CKMBINDEX, TROPONINI,  in the last 168 hours BNP: No components found with this basename: POCBNP,  CBG:  Recent Labs Lab 10/01/13 2049 10/02/13 0753 10/02/13 1213 10/02/13 1655 10/02/13 2108  GLUCAP 198* 70 84 250* 211*    No results found for this or any previous visit (from the past 240 hour(s)).   Studies:              All Imaging reviewed and is as per above notation   Scheduled Meds: . amitriptyline  25-50 mg Oral QHS  . atenolol  100 mg Oral BH-q7a  . cefTRIAXone (ROCEPHIN)  IV  1 g Intravenous Q24H  . heparin  5,000 Units Subcutaneous 3 times per day  . hydrocortisone  10 mg Oral BID  . insulin aspart  0-9 Units Subcutaneous TID WC  . insulin detemir  8 Units Subcutaneous QHS  . lactose free nutrition  237 mL Oral TID BM  . nicotine  21 mg  Transdermal Daily  . OxyCODONE  10 mg Oral 3 times per day  . pantoprazole  40 mg Oral Daily  . potassium chloride  10 mEq Intravenous Q1 Hr x 3  . potassium chloride  40 mEq Oral BID  . tamoxifen  20 mg Oral BID  . thiamine  100 mg Intravenous Daily  . topiramate  50 mg Oral BID  . Vortioxetine HBr  10 mg Oral Daily   Continuous Infusions: . sodium chloride 1,000 mL (10/03/13 0811)     Assessment/Plan:  1. Metabolic  Encephalopathy-ammonia no elevated. Secondary to chronic pain medications +mild acute kidney injury. Improving . See below 2. Hypokalemia-unclear etiology.  Monitor. Replacing IV 6 runs, added oral potassium as well a 40 Kdur twice a day 3. ? Urinary tract infection/pyelonephritis-no fever no chills and no dysuria however had many leukocytes on UA on admission and urine culture not done at that time. Cover with ceftriaxone 1 g every 24 for now.  Follow urine culture 7/24 4. Chronic pain-patient in no significant pain currently and seems comfortable however asking for chronic meds.  Have re\re explained to her again today rationale-increase OxyContin every 12-->q. 8 will not increase either her short acting oxycodone 5 twice a day on her Ativan for right now. 5. Cough-unclear etiology-? Bronchitis from chronic smoking 6. Posttussive nausea-component of her antidepressant Brintellix which was cut back from 20-10 mg on admission.  We may need to discontinued this.   7. Hepatic derangement without sepsis/organ damage-ultrasound abdomen pelvis 7/24 suggestive of biliary duct dilatation 15.9 millimeters.  MRCP 7/24 =? Biliary stricture.  Appreciate GI input-ERCP scheduled for 10/05/13--please note she tells me Dr. Dalbert Batman when doing lap chole couldn't "get down in there because it was too tight" 8.  Fibromyalgia-see above discussion 9. Anxiety see above discussion  10. History breast cancer on tamoxifen-monitor. LFTs likely represent frank obstructive assess secondary to elevated alk  phosphatase-tamoxifen is not likely to be of concern in this case Need to recheck mammogram as an outpatient 11. Hypercalcemia-was on vitamin D which makes specific diagnosis challenging. Corrected calcium =10.8.  No further workup at this stage it was trending downward appropriately 12.  hypertension continue atenolol 50 daily 13. 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 Opiate-induced Constipation-will add sorbitol 30 mils twice a day until diarrhea and needs of her bowel regime 15. Adrenal insufficiency and continue -continue Cortef 10 mg twice a day  Code Status: Full  Family Communication: Call her daughter Marvene Staff [phone]  Disposition Plan: inpatient   [name], MD  [facility] Pager [phone] 10/03/2013, 8:14 AM    LOS: 2 days

## 2013-10-03 NOTE — Progress Notes (Signed)
Subjective: Feeling weak and tired.  Objective: Vital signs in last 24 hours: Temp:  [97.9 F (36.6 C)-98.2 F (36.8 C)] 97.9 F (36.6 C) (07/25 0459) Pulse Rate:  [72-94] 94 (07/25 0459) Resp:  [16] 16 (07/25 0459) BP: (154-164)/(79-89) 164/87 mmHg (07/25 0459) SpO2:  [96 %-98 %] 96 % (07/25 0459) Weight:  [90 lb 6.2 oz (41 kg)] 90 lb 6.2 oz (41 kg) (07/25 0459) Last BM Date: 10/02/13  Intake/Output from previous day: 07/24 0701 - 07/25 0700 In: 3085.4 [I.V.:3035.4; IV Piggyback:50] Out: -  Intake/Output this shift:    General appearance: alert and no distress GI: ? lower periumbilical pain  Lab Results:  Recent Labs  10/01/13 1327 10/02/13 0544 10/03/13 0431  WBC 8.2 5.0 4.4  HGB 11.1* 9.4* 9.3*  HCT 33.5* 28.4* 27.7*  PLT 188 133* PENDING   BMET  Recent Labs  10/01/13 1524 10/02/13 0544 10/03/13 0431  NA 141 143 145  K 3.7 3.8 2.9*  CL 104 107 109  CO2 27 24 23   GLUCOSE 93 73 112*  BUN 40* 35* 28*  CREATININE 2.13* 1.97* 1.73*  CALCIUM 11.9* 10.8* 9.6   LFT  Recent Labs  10/03/13 0431  PROT 5.4*  ALBUMIN 2.5*  AST 31  ALT 67*  ALKPHOS 178*  BILITOT <0.2*   PT/INR  Recent Labs  10/02/13 0544  LABPROT 12.7  INR 0.95   Hepatitis Panel No results found for this basename: HEPBSAG, HCVAB, HEPAIGM, HEPBIGM,  in the last 72 hours C-Diff No results found for this basename: CDIFFTOX,  in the last 72 hours Fecal Lactopherrin No results found for this basename: FECLLACTOFRN,  in the last 72 hours  Studies/Results: Dg Chest 2 View  10/01/2013   CLINICAL DATA:  Fatigue.  Weakness.  EXAM: CHEST  2 VIEW  COMPARISON:  08/14/2013.  08/22/2012.  FINDINGS: Improved RIGHT middle lobe airspace disease compatible with treated pneumonia. Chronic pulmonary parenchymal scarring and postsurgical changes. Cardiopericardial silhouette appears within normal limits. Chronic volume loss in the LEFT lung and elevation of the LEFT hemidiaphragm.  IMPRESSION:  Improved RIGHT middle lobe airspace disease consistent with treated pneumonia. Lower lung volumes than on prior. Chronic pulmonary parenchymal scarring.   Electronically Signed   By: Dereck Ligas M.D.   On: 10/01/2013 14:43   Ct Head Wo Contrast  10/01/2013   CLINICAL DATA:  Fatigue and weakness  EXAM: CT HEAD WITHOUT CONTRAST  TECHNIQUE: Contiguous axial images were obtained from the base of the skull through the vertex without intravenous contrast.  COMPARISON:  CT head 05/06/2013  FINDINGS: Generalized atrophy with mild chronic microvascular ischemia  Negative for acute infarct.  Negative for hemorrhage or mass.  Extensive sinus surgery on the left. Extensive bony thickening of the left maxillary sinus.  IMPRESSION: Atrophy and mild chronic microvascular ischemia. No acute abnormality.   Electronically Signed   By: Franchot Gallo M.D.   On: 10/01/2013 15:18   US Abdomen Complete  10/02/2013   CLINICAL DATA:  Abdominal pain.  History of a cholecystectomy.  EXAM: ULTRASOUND ABDOMEN COMPLETE  COMPARISON:  CT, 10/13/2003  FINDINGS: Gallbladder:  Surgically removed  Common bile duct:  Diameter: 15.9 mm. No duct stone is seen. Most distal aspect of the duct was not visualized.  Liver:  Intrahepatic bile duct dilation. Liver normal in size in echogenicity. No liver mass. Normal hepatopetal flow in the portal vein.  IVC:  No abnormality visualized.  Pancreas:  Not well visualized. Much of the lower pancreatic head  was obscured by bowel gas.  Spleen:  Size and appearance within normal limits.  Right Kidney:  Length: 11.6 cm. Normal parenchymal echogenicity. Diffuse cortical thinning. Small cyst with some internal echoes in the lower pole measuring 1 cm. No other renal masses. No hydronephrosis.  Left Kidney:  Length: 11.5 cm. There is cortical thinning. Normal parenchymal echogenicity. 10 mm exophytic cysts from the lateral margin of the mid to lower pole with a thin septation. 11 mm simple appearing upper pole  cyst. No other renal masses. No hydronephrosis.  Abdominal aorta:  No aneurysm visualized.  Other findings:  None.  IMPRESSION: 1. There is significant intern extrahepatic bile duct dilation of unclear etiology. The duct measures 15.9 mm in diameter, which is a significant increase in caliber from the prior CT. The most distal aspect of the duct, and much of the pancreatic head, was not visualized due to overlying bowel gas. A distal duct stone or a pancreatic mass is not excluded. Recommend followup ERCP or MRI/MRCP. If MRI is chosen, recommend pancreatic protocol MRI with contrast with MRCP. 2. Status post cholecystectomy. 3. Small renal cysts and bilateral renal cortical thinning. 4. No other abnormalities.   Electronically Signed   By: Lajean Manes M.D.   On: 10/02/2013 11:52   Mr Abdomen Mrcp Wo Cm  10/02/2013   CLINICAL DATA:  Dilated bile ducts noted on recent ultrasound examination.  EXAM: MRI ABDOMEN WITHOUT  (INCLUDING MRCP)  TECHNIQUE: Multiplanar multisequence MR imaging of the abdomen was performed. Heavily T2-weighted images of the biliary and pancreatic ducts were obtained, and three-dimensional MRCP images were rendered by post processing.  COMPARISON:  Abdominal ultrasound 10/02/2013.  FINDINGS: Severe intra and extrahepatic biliary ductal dilatation is noted, with both the common bile duct and the common hepatic duct measuring up to 18 mm in diameter. The cystic duct remnant is also dilated. Patient is status post cholecystectomy. There are no filling defects within the bile ducts to suggest the presence of retained ductal stones. The distal common bile duct abruptly tapers immediately before the level of the ampulla. The pancreatic duct is not dilated. The region of the pancreatic head is poorly evaluated on today's non contrast CT examination. No large pancreatic head mass is identified. A small ampullary lesion causing obstruction cannot be excluded on the basis of today's examination. No  focal hepatic lesions are noted on today's noncontrast study.  The unenhanced appearance of the pancreas, spleen and bilateral adrenal glands is unremarkable. There are numerous renal lesions bilaterally, which are predominantly low signal intensity on T1 weighted images and high signal intensity on T2 weighted images, most likely to represent cysts (incompletely characterized on today's noncontrast CT examination). In addition, several renal lesions demonstrate some dependent high T1 signal intensity, compatible with proteinaceous or hemorrhagic contents.  IMPRESSION: 1. Severe intra and extrahepatic biliary ductal dilatation redemonstrated. This abruptly terminates in the distal common bile duct immediately before the level of the ampulla. 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 The possibility of a tiny obstructing ampullary mass is not excluded on today's noncontrast examination. No large obstructing pancreatic head mass is noted. Correlation with ERCP may provide additional diagnostic information if clinically appropriate. 2. Additional incidental findings, as above.   Electronically Signed   By: Vinnie Langton M.D.   On: 10/02/2013 20:19   Mr 3d Recon At Scanner  10/02/2013   CLINICAL DATA:  Dilated bile ducts noted on  recent ultrasound examination.  EXAM: MRI ABDOMEN WITHOUT  (INCLUDING MRCP)  TECHNIQUE: Multiplanar multisequence MR imaging of the abdomen was performed. Heavily T2-weighted images of the biliary and pancreatic ducts were obtained, and three-dimensional MRCP images were rendered by post processing.  COMPARISON:  Abdominal ultrasound 10/02/2013.  FINDINGS: Severe intra and extrahepatic biliary ductal dilatation is noted, with both the common bile duct and the common hepatic duct measuring up to 18 mm in diameter. The cystic duct remnant is also dilated. Patient is status post cholecystectomy. There are no  filling defects within the bile ducts to suggest the presence of retained ductal stones. The distal common bile duct abruptly tapers immediately before the level of the ampulla. The pancreatic duct is not dilated. The region of the pancreatic head is poorly evaluated on today's non contrast CT examination. No large pancreatic head mass is identified. A small ampullary lesion causing obstruction cannot be excluded on the basis of today's examination. No focal hepatic lesions are noted on today's noncontrast study.  The unenhanced appearance of the pancreas, spleen and bilateral adrenal glands is unremarkable. There are numerous renal lesions bilaterally, which are predominantly low signal intensity on T1 weighted images and high signal intensity on T2 weighted images, most likely to represent cysts (incompletely characterized on today's noncontrast CT examination). In addition, several renal lesions demonstrate some dependent high T1 signal intensity, compatible with proteinaceous or hemorrhagic contents.  IMPRESSION: 1. Severe intra and extrahepatic biliary ductal dilatation redemonstrated. This abruptly terminates in the distal common bile duct immediately before the level of the ampulla. 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 The possibility of a tiny obstructing ampullary mass is not excluded on today's noncontrast examination. No large obstructing pancreatic head mass is noted. Correlation with ERCP may provide additional diagnostic information if clinically appropriate. 2. Additional incidental findings, as above.   Electronically Signed   By: Vinnie Langton M.D.   On: 10/02/2013 20:19   Dg Abd 2 Views  10/01/2013   CLINICAL DATA:  History of pancreatectomy, fatigue  EXAM: ABDOMEN - 2 VIEW  COMPARISON:  None.  FINDINGS: Moderate amount of stool throughout the colon. Surgical clips are scattered primarily in the left upper  quadrant. There is no bowel dilatation to suggest obstruction. There is no evidence of pneumoperitoneum, portal venous gas or pneumatosis. There are no pathologic calcifications along the expected course of the ureters.  Mild osteoarthritis of bilateral hips.  IMPRESSION: No bowel obstruction.  Moderate amount of stool within the colon.   Electronically Signed   By: Kathreen Devoid   On: 10/01/2013 16:13    Medications:  Scheduled: . atenolol  100 mg Oral BH-q7a  . cefTRIAXone (ROCEPHIN)  IV  1 g Intravenous Q24H  . heparin  5,000 Units Subcutaneous 3 times per day  . hydrocortisone  10 mg Oral BID  . insulin aspart  0-9 Units Subcutaneous TID WC  . insulin detemir  8 Units Subcutaneous QHS  . lactose free nutrition  237 mL Oral TID BM  . nicotine  21 mg Transdermal Daily  . OxyCODONE  10 mg Oral 3 times per day  . pantoprazole  40 mg Oral Daily  . potassium chloride  10 mEq Intravenous Q1 Hr x 3  . tamoxifen  20 mg Oral BID  . thiamine  100 mg Intravenous Daily  . Vortioxetine HBr  10 mg Oral Daily   Continuous: . sodium chloride 1,000  mL (10/02/13 2343)    Assessment/Plan: 1) ? Ampullary stricture.  2) Periumbilical pain.    My examination was benign with regards to her abdominal pain complaint. She states that she has some lower periumbilical pain. As for her MRCP there is suggestion of an ampullary stricture as there is a abrupt cut off. Her transaminases have actually improved. Her TB is normal. She will require an ERCP and I will schedule the procedure with  GI on Monday with anesthesia.  Plan: 1) ERCP on Monday with  GI.   LOS: 2 days   Xylia Scherger D 10/03/2013, 6:44 AM

## 2013-10-04 LAB — COMPREHENSIVE METABOLIC PANEL
ALK PHOS: 163 U/L — AB (ref 39–117)
ALT: 56 U/L — ABNORMAL HIGH (ref 0–35)
ANION GAP: 13 (ref 5–15)
AST: 27 U/L (ref 0–37)
Albumin: 2.8 g/dL — ABNORMAL LOW (ref 3.5–5.2)
BUN: 17 mg/dL (ref 6–23)
CHLORIDE: 111 meq/L (ref 96–112)
CO2: 20 mEq/L (ref 19–32)
Calcium: 8.5 mg/dL (ref 8.4–10.5)
Creatinine, Ser: 1.21 mg/dL — ABNORMAL HIGH (ref 0.50–1.10)
GFR calc Af Amer: 53 mL/min — ABNORMAL LOW (ref >90)
GFR, EST NON AFRICAN AMERICAN: 46 mL/min — AB (ref >90)
GLUCOSE: 111 mg/dL — AB (ref 70–99)
Potassium: 3.5 mEq/L — ABNORMAL LOW (ref 3.7–5.3)
SODIUM: 144 meq/L (ref 137–147)
Total Protein: 5.8 g/dL — ABNORMAL LOW (ref 6.0–8.3)

## 2013-10-04 LAB — URINE CULTURE: Colony Count: >=100000

## 2013-10-04 LAB — GLUCOSE, CAPILLARY
Glucose-Capillary: 71 mg/dL (ref 70–99)
Glucose-Capillary: 254 mg/dL — ABNORMAL HIGH (ref 70–99)
Glucose-Capillary: 247 mg/dL — ABNORMAL HIGH (ref 70–99)
Glucose-Capillary: 203 mg/dL — ABNORMAL HIGH (ref 70–99)

## 2013-10-04 MED ORDER — LORAZEPAM 0.5 MG PO TABS: 0.5000 mg | ORAL_TABLET | Freq: Two times a day (BID) | ORAL | Status: DC | PRN

## 2013-10-04 MED ORDER — LORAZEPAM 0.5 MG PO TABS
0.5000 mg | ORAL_TABLET | Freq: Every evening | ORAL | Status: DC | PRN
  Administered 2013-10-04 – 2013-10-05 (×2): 0.5 mg via ORAL
  Filled 2013-10-04 (×2): qty 1

## 2013-10-04 MED ORDER — SODIUM CHLORIDE 0.9 % IV SOLN
INTRAVENOUS | Status: DC
  Administered 2013-10-05 (×2): via INTRAVENOUS

## 2013-10-04 NOTE — Progress Notes (Addendum)
Called on-call provider to clarify medication orders for anxiety/sleep. Patient stated that she was told by the rounding MD today that she could have 1 mg of Lorazepam tonight for anxiety and sleep. Patient has orders for Lorazepam 0.5 mg Q12H and a Lorazepam 0.5 mg order prn for sleep or anxiety. On-call provider instructed RN to give 0.5 mg tonight for anxiety/sleep and if patient has no relief after 1-2 hours, may give an additional 0.5 mg. Will continue to monitor.

## 2013-10-04 NOTE — Progress Notes (Signed)
Subjective: Feeling better today.  Not as fatigued.  Objective: Vital signs in last 24 hours: Temp:  [97.4 F (36.3 C)-97.9 F (36.6 C)] 97.8 F (36.6 C) (07/26 0501) Pulse Rate:  [62-73] 62 (07/26 0501) Resp:  [16] 16 (07/26 0501) BP: (124-155)/(72-88) 155/88 mmHg (07/26 0501) SpO2:  [100 %] 100 % (07/26 0501) Weight:  [90 lb 6.2 oz (41 kg)] 90 lb 6.2 oz (41 kg) (07/26 0501) Last BM Date: 10/03/13  Intake/Output from previous day: 07/25 0701 - 07/26 0700 In: 666 [P.O.:666] Out: -  Intake/Output this shift:    General appearance: alert and no distress GI: soft, non-tender; bowel sounds normal; no masses,  no organomegaly  Lab Results:  Recent Labs  10/01/13 1327 10/02/13 0544 10/03/13 0431  WBC 8.2 5.0 4.4  HGB 11.1* 9.4* 9.3*  HCT 33.5* 28.4* 27.7*  PLT 188 133* 118*   BMET  Recent Labs  10/01/13 1524 10/02/13 0544 10/03/13 0431  NA 141 143 145  K 3.7 3.8 2.9*  CL 104 107 109  CO2 27 24 23   GLUCOSE 93 73 112*  BUN 40* 35* 28*  CREATININE 2.13* 1.97* 1.73*  CALCIUM 11.9* 10.8* 9.6   LFT  Recent Labs  10/03/13 0431  PROT 5.4*  ALBUMIN 2.5*  AST 31  ALT 67*  ALKPHOS 178*  BILITOT <0.2*   PT/INR  Recent Labs  10/02/13 0544  LABPROT 12.7  INR 0.95   Hepatitis Panel No results found for this basename: HEPBSAG, HCVAB, HEPAIGM, HEPBIGM,  in the last 72 hours C-Diff No results found for this basename: CDIFFTOX,  in the last 72 hours Fecal Lactopherrin No results found for this basename: FECLLACTOFRN,  in the last 72 hours  Studies/Results: US Abdomen Complete  10/02/2013   CLINICAL DATA:  Abdominal pain.  History of a cholecystectomy.  EXAM: ULTRASOUND ABDOMEN COMPLETE  COMPARISON:  CT, 10/13/2003  FINDINGS: Gallbladder:  Surgically removed  Common bile duct:  Diameter: 15.9 mm. No duct stone is seen. Most distal aspect of the duct was not visualized.  Liver:  Intrahepatic bile duct dilation. Liver normal in size in echogenicity. No liver  mass. Normal hepatopetal flow in the portal vein.  IVC:  No abnormality visualized.  Pancreas:  Not well visualized. Much of the lower pancreatic head was obscured by bowel gas.  Spleen:  Size and appearance within normal limits.  Right Kidney:  Length: 11.6 cm. Normal parenchymal echogenicity. Diffuse cortical thinning. Small cyst with some internal echoes in the lower pole measuring 1 cm. No other renal masses. No hydronephrosis.  Left Kidney:  Length: 11.5 cm. There is cortical thinning. Normal parenchymal echogenicity. 10 mm exophytic cysts from the lateral margin of the mid to lower pole with a thin septation. 11 mm simple appearing upper pole cyst. No other renal masses. No hydronephrosis.  Abdominal aorta:  No aneurysm visualized.  Other findings:  None.  IMPRESSION: 1. There is significant intern extrahepatic bile duct dilation of unclear etiology. The duct measures 15.9 mm in diameter, which is a significant increase in caliber from the prior CT. The most distal aspect of the duct, and much of the pancreatic head, was not visualized due to overlying bowel gas. A distal duct stone or a pancreatic mass is not excluded. Recommend followup ERCP or MRI/MRCP. If MRI is chosen, recommend pancreatic protocol MRI with contrast with MRCP. 2. Status post cholecystectomy. 3. Small renal cysts and bilateral renal cortical thinning. 4. No other abnormalities.   Electronically Signed  By: Lajean Manes M.D.   On: 10/02/2013 11:52   Mr Abdomen Mrcp Wo Cm  10/02/2013   CLINICAL DATA:  Dilated bile ducts noted on recent ultrasound examination.  EXAM: MRI ABDOMEN WITHOUT  (INCLUDING MRCP)  TECHNIQUE: Multiplanar multisequence MR imaging of the abdomen was performed. Heavily T2-weighted images of the biliary and pancreatic ducts were obtained, and three-dimensional MRCP images were rendered by post processing.  COMPARISON:  Abdominal ultrasound 10/02/2013.  FINDINGS: Severe intra and extrahepatic biliary ductal dilatation  is noted, with both the common bile duct and the common hepatic duct measuring up to 18 mm in diameter. The cystic duct remnant is also dilated. Patient is status post cholecystectomy. There are no filling defects within the bile ducts to suggest the presence of retained ductal stones. The distal common bile duct abruptly tapers immediately before the level of the ampulla. The pancreatic duct is not dilated. The region of the pancreatic head is poorly evaluated on today's non contrast CT examination. No large pancreatic head mass is identified. A small ampullary lesion causing obstruction cannot be excluded on the basis of today's examination. No focal hepatic lesions are noted on today's noncontrast study.  The unenhanced appearance of the pancreas, spleen and bilateral adrenal glands is unremarkable. There are numerous renal lesions bilaterally, which are predominantly low signal intensity on T1 weighted images and high signal intensity on T2 weighted images, most likely to represent cysts (incompletely characterized on today's noncontrast CT examination). In addition, several renal lesions demonstrate some dependent high T1 signal intensity, compatible with proteinaceous or hemorrhagic contents.  IMPRESSION: 1. Severe intra and extrahepatic biliary ductal dilatation redemonstrated. This abruptly terminates in the distal common bile duct immediately before the level of the ampulla. 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 The possibility of a tiny obstructing ampullary mass is not excluded on today's noncontrast examination. No large obstructing pancreatic head mass is noted. Correlation with ERCP may provide additional diagnostic information if clinically appropriate. 2. Additional incidental findings, as above.   Electronically Signed   By: Vinnie Langton M.D.   On: 10/02/2013 20:19   Mr 3d Recon At Scanner  10/02/2013   CLINICAL  DATA:  Dilated bile ducts noted on recent ultrasound examination.  EXAM: MRI ABDOMEN WITHOUT  (INCLUDING MRCP)  TECHNIQUE: Multiplanar multisequence MR imaging of the abdomen was performed. Heavily T2-weighted images of the biliary and pancreatic ducts were obtained, and three-dimensional MRCP images were rendered by post processing.  COMPARISON:  Abdominal ultrasound 10/02/2013.  FINDINGS: Severe intra and extrahepatic biliary ductal dilatation is noted, with both the common bile duct and the common hepatic duct measuring up to 18 mm in diameter. The cystic duct remnant is also dilated. Patient is status post cholecystectomy. There are no filling defects within the bile ducts to suggest the presence of retained ductal stones. The distal common bile duct abruptly tapers immediately before the level of the ampulla. The pancreatic duct is not dilated. The region of the pancreatic head is poorly evaluated on today's non contrast CT examination. No large pancreatic head mass is identified. A small ampullary lesion causing obstruction cannot be excluded on the basis of today's examination. No focal hepatic lesions are noted on today's noncontrast study.  The unenhanced appearance of the pancreas, spleen and bilateral adrenal glands is unremarkable. There are numerous renal lesions bilaterally, which are predominantly low signal intensity on T1 weighted images and high signal intensity  on T2 weighted images, most likely to represent cysts (incompletely characterized on today's noncontrast CT examination). In addition, several renal lesions demonstrate some dependent high T1 signal intensity, compatible with proteinaceous or hemorrhagic contents.  IMPRESSION: 1. Severe intra and extrahepatic biliary ductal dilatation redemonstrated. This abruptly terminates in the distal common bile duct immediately before the level of the ampulla. Findings are favored to represent a distal common bile duct stricture, as there is no  retained ductal stone identified. The possibility of a tiny obstructing ampullary mass is not excluded on today's noncontrast examination. No large obstructing pancreatic head mass is noted. Correlation with ERCP may provide additional diagnostic information if clinically appropriate. 2. Additional incidental findings, as above.   Electronically Signed   By: Vinnie Langton M.D.   On: 10/02/2013 20:19    Medications:  Scheduled: . amitriptyline  25-50 mg Oral QHS  . atenolol  100 mg Oral BH-q7a  . cefTRIAXone (ROCEPHIN)  IV  1 g Intravenous Q24H  . heparin  5,000 Units Subcutaneous 3 times per day  . hydrocortisone  10 mg Oral BID  . insulin aspart  0-9 Units Subcutaneous TID WC  . insulin detemir  8 Units Subcutaneous QHS  . lactose free nutrition  237 mL Oral TID BM  . nicotine  21 mg Transdermal Daily  . OxyCODONE  10 mg Oral 3 times per day  . pantoprazole  40 mg Oral Daily  . potassium chloride  40 mEq Oral BID  . sorbitol  30 mL Oral BID AC  . tamoxifen  20 mg Oral BID  . thiamine  100 mg Intravenous Daily  . topiramate  50 mg Oral BID  . Vortioxetine HBr  10 mg Oral Daily   Continuous: . sodium chloride 1,000 mL (10/03/13 1639)  . sodium chloride      Assessment/Plan: 1) Distal CBD stricture.  2) Fatigue - improved.   The patient is clinically better today.  No new labs at this time, but she will still undergo the ERCP.  Plan: 1) ERCP with Dr. Fuller Plan under [place].  LOS: 3 days   Marypat Kimmet D 10/04/2013, 7:38 AM

## 2013-10-04 NOTE — Progress Notes (Signed)
Note: This document was prepared with digital dictation and possible smart phrase technology. Any transcriptional errors that result from this process are unintentional.   Lindsey Gomez QMG:[id number] DOB: [birth year] DOA: 10/01/2013 PCP: Walker Kehr, MD  Brief narrative:  68 y/o ?, known well documented chronic pain on opiates, fibromyalgia ty 1 DM, Prior ETOH pancreatitis c h/o Pancreatectomy, migraine, COPD,Gerd+ prior history Barrett's esophagus 09/05/2010 follwed by Dr. Deatra Ina, prior h/o Breast CA? 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 05/09/2005  Presented from primary care physician's office with concerns of 2-3 week history increasing somnolence, poor by mouth appetite, nausea vomiting She was found to have elevated calcium in setting chronic Vit D replacement, also new onset deranged LFT's without source Ultimately ultrasound abdomen/MRCP performed showed dilated common bile to 50 mm and GI was consulted and is scheduled for ERCP 7/27  Past medical history-As per Problem list Chart reviewed as below-   Consultants:  GI  Procedures:  CXR  Antibiotics:  Ceftriaxone 7/24   Subjective   Nausea this morning could not tolerate diet-tolerating boost however No chest pain no nausea no vomiting no shortness of breath Requesting permission to use her home caffeine tablet prior to procedure tomorrow she gets migraines without this   Objective    Interim History: none  Telemetry:  non tele   Objective: Filed Vitals:   10/03/13 0459 10/03/13 1443 10/03/13 2100 10/04/13 0501  BP: 164/87 136/78 124/72 155/88  Pulse: 94 71 73 62  Temp: 97.9 F (36.6 C) 97.4 F (36.3 C) 97.9 F (36.6 C) 97.8 F (36.6 C)  TempSrc: Oral Oral Oral Oral  Resp: _0 Height:      Weight: 41 kg (90 lb 6.2 oz)   41 kg (90 lb 6.2 oz)  SpO2:  96% 100% 100% 100%    Intake/Output Summary (Last 24 hours) at 10/04/13 1235 Last data filed at 10/03/13 1800  Gross per 24 hour  Intake    444 ml  Output      0 ml  Net    444 ml    Exam:  General: much more alert, pleasan tin NAD currently Cardiovascular: s1s 2 no m/r/g Respiratory: clear, no added sound Abdomen: soft, mild tenderness in central abdomen Skin no le edema   Data Reviewed: Basic Metabolic Panel:  Recent Labs Lab 10/01/13 1327 10/01/13 1524 10/02/13 0544 10/03/13 0431 10/04/13 0900  NA 139 141 143 145 144  K 3.8 3.7 3.8 2.9* 3.5*  CL 99 104 107 109 111  CO2 _1 GLUCOSE 149* 93 73 112* 111*  BUN 41* 40* 35* 28* 17  CREATININE 2.24* 2.13* 1.97* 1.73* 1.21*  CALCIUM 13.1* 11.9* 10.8* 9.6 8.5  MG  --   --   --  1.9  --    Liver Function Tests:  Recent Labs Lab 10/01/13 1327 10/02/13 0544 10/03/13 0431 10/04/13 0900  AST 73* 64* 31 27  ALT 139* 101* 67* 56*  ALKPHOS 257* 212* 178* 163*  BILITOT 0.3 0.2* <0.2* <0.2*  PROT 7.1 5.7* 5.4* 5.8*  ALBUMIN 3.5 2.8* 2.5* 2.8*   No results found for this basename: LIPASE, AMYLASE,  in the last 168 hours  Recent Labs Lab 10/01/13 1524  AMMONIA 21   CBC:  Recent Labs Lab 10/01/13 1327 10/02/13 0544 10/03/13 0431  WBC 8.2 5.0 4.4  NEUTROABS  4.4  --  2.2  HGB 11.1* 9.4* 9.3*  HCT 33.5* 28.4* 27.7*  MCV 91.0 90.4 89.9  PLT 188 133* 118*   Cardiac Enzymes: No results found for this basename: CKTOTAL, CKMB, CKMBINDEX, TROPONINI,  in the last 168 hours BNP: No components found with this basename: POCBNP,  CBG:  Recent Labs Lab 10/03/13 1245 10/03/13 1750 10/03/13 2102 10/04/13 0759 10/04/13 1152  GLUCAP 246* 257* 234* 71 254*    Recent Results (from the past 240 hour(s))  URINE CULTURE     Status: None   Collection Time    10/02/13 10:28 AM      Result Value Ref Range Status   Specimen Description URINE, CLEAN CATCH   Final   Special Requests NONE   Final   Culture   Setup Time     Final   Value: 10/02/2013 10:55     Performed at [facility]     Final   Value: >=100,000 COLONIES/ML     Performed at [facility]   Culture     Final   Value: [place]     Performed at [facility]   Report Status PENDING   Incomplete     Studies:              All Imaging reviewed and is as per above notation   Scheduled Meds: . amitriptyline  25-50 mg Oral QHS  . atenolol  100 mg Oral BH-q7a  . cefTRIAXone (ROCEPHIN)  IV  1 g Intravenous Q24H  . heparin  5,000 Units Subcutaneous 3 times per day  . hydrocortisone  10 mg Oral BID  . insulin aspart  0-9 Units Subcutaneous TID WC  . insulin detemir  8 Units Subcutaneous QHS  . lactose free nutrition  237 mL Oral TID BM  . nicotine  21 mg Transdermal Daily  . OxyCODONE  10 mg Oral 3 times per day  . pantoprazole  40 mg Oral Daily  . potassium chloride  40 mEq Oral BID  . sorbitol  30 mL Oral BID AC  . tamoxifen  20 mg Oral BID  . thiamine  100 mg Intravenous Daily  . topiramate  50 mg Oral BID  . Vortioxetine HBr  10 mg Oral Daily   Continuous Infusions: . sodium chloride 1,000 mL (10/03/13 1639)  . sodium chloride       Assessment/Plan:  1. Metabolic  Encephalopathy-ammonia no elevated. Secondary to chronic pain medications +mild acute kidney injury. Improving . See below 2. Hypokalemia-unclear etiology.  Monitor. Replacing IV 6 runs, added oral potassium as well a 40 Kdur twice a day.  Continue replacement 3. Gram-negative rod with pyelonephritis-no fever  Cover with ceftriaxone 1 g every 24 for now.   4. Chronic pain-patient in no significant pain currently and seems comfortable however asking for chronic meds.  Have re\re explained to her again today rationale-increase OxyContin every 12-->q. 8 will not increase either her short acting oxycodone 5 twice a day on her Ativan for right now. 5. Cough-unclear etiology-? Bronchitis from chronic smoking-chest  x-ray 7/23 resolving pneumonia 6. Posttussive nausea-component of her antidepressant Brintellix which was cut back from 20-10 mg on admission.  We may need to discontinued this.   7. Hepatic derangement DDX-PBC?-ultrasound abdomen pelvis 7/24 suggestive of biliary duct dilatation 15.9 millimeters.  MRCP 7/24 =? Biliary stricture. Transaminases trending down  Appreciate GI input-ERCP scheduled for 10/05/13--please note she tells me Dr. Dalbert Batman  when doing lap chole couldn't "get down in there because it was too tight" 8.  Fibromyalgia-see above discussion 9. Anxiety see above discussion  10. History breast cancer on tamoxifen-monitor. LFTs likely represent frank obstructive assess secondary to elevated alk phosphatase-tamoxifen is not likely to be of concern in this case Need to recheck mammogram as an outpatient 11. Hypercalcemia-was on vitamin D which makes specific diagnosis challenging. Corrected calcium =9.5.  No further workup 12.  hypertension continue atenolol 50 daily 13. 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 Opiate-induced Constipation-will add sorbitol 30 mils twice a day until diarrhea and needs of her bowel regime 15. Adrenal insufficiency and continue -continue Cortef 10 mg twice a day  Code Status: Full  Family Communication: no answer on Daughter phone. Disposition Plan: inpatient   [name], MD  [facility] Pager [phone] 10/04/2013, 12:35 PM    LOS: 3 days

## 2013-10-04 NOTE — Progress Notes (Signed)
Paged on-call provider regarding Ativan orders for patient. Plan discussed earlier with provider was in error. Read order to provider as 0.5 mg prn sleep, when order actually states 1 mg for sleep. Contacted provider to clarify plan for patient. Will continue to monitor.

## 2013-10-05 ENCOUNTER — Encounter (HOSPITAL_COMMUNITY): Payer: Self-pay | Admitting: *Deleted

## 2013-10-05 ENCOUNTER — Encounter (HOSPITAL_COMMUNITY): Payer: Medicare Other | Admitting: Certified Registered Nurse Anesthetist

## 2013-10-05 ENCOUNTER — Encounter (HOSPITAL_COMMUNITY)
Admission: EM | Disposition: A | Discharge status: Home / Self Care | Payer: Self-pay | Source: Home / Self Care | Attending: Family Medicine

## 2013-10-05 ENCOUNTER — Inpatient Hospital Stay (HOSPITAL_COMMUNITY): Payer: Medicare Other | Admitting: Certified Registered Nurse Anesthetist

## 2013-10-05 ENCOUNTER — Inpatient Hospital Stay (HOSPITAL_COMMUNITY): Payer: Medicare Other

## 2013-10-05 DIAGNOSIS — R740 Nonspecific elevation of levels of transaminase and lactic acid dehydrogenase [LDH]: Secondary | ICD-10-CM

## 2013-10-05 DIAGNOSIS — R7402 Elevation of levels of lactic acid dehydrogenase (LDH): Secondary | ICD-10-CM

## 2013-10-05 DIAGNOSIS — K831 Obstruction of bile duct: Secondary | ICD-10-CM

## 2013-10-05 HISTORY — PX: ERCP: SHX5425

## 2013-10-05 LAB — COMPREHENSIVE METABOLIC PANEL
ALT: 48 U/L — AB (ref 0–35)
ANION GAP: 12 (ref 5–15)
AST: 22 U/L (ref 0–37)
Albumin: 2.9 g/dL — ABNORMAL LOW (ref 3.5–5.2)
Alkaline Phosphatase: 149 U/L — ABNORMAL HIGH (ref 39–117)
BUN: 13 mg/dL (ref 6–23)
CALCIUM: 8 mg/dL — AB (ref 8.4–10.5)
CO2: 20 meq/L (ref 19–32)
Chloride: 114 mEq/L — ABNORMAL HIGH (ref 96–112)
Creatinine, Ser: 1.05 mg/dL (ref 0.50–1.10)
GFR calc Af Amer: 63 mL/min — ABNORMAL LOW (ref >90)
GFR, EST NON AFRICAN AMERICAN: 54 mL/min — AB (ref >90)
GLUCOSE: 113 mg/dL — AB (ref 70–99)
Potassium: 4 mEq/L (ref 3.7–5.3)
SODIUM: 146 meq/L (ref 137–147)
Total Bilirubin: <0.2 mg/dL — ABNORMAL LOW (ref 0.3–1.2)
Total Protein: 5.9 g/dL — ABNORMAL LOW (ref 6.0–8.3)

## 2013-10-05 LAB — GLUCOSE, CAPILLARY
GLUCOSE-CAPILLARY: 99 mg/dL (ref 70–99)
GLUCOSE-CAPILLARY: 140 mg/dL — AB (ref 70–99)
GLUCOSE-CAPILLARY: 133 mg/dL — AB (ref 70–99)
Glucose-Capillary: 195 mg/dL — ABNORMAL HIGH (ref 70–99)

## 2013-10-05 SURGERY — ERCP, WITH INTERVENTION IF INDICATED
Anesthesia: General

## 2013-10-05 MED ORDER — SODIUM CHLORIDE 0.9 % IV SOLN
INTRAVENOUS | Status: DC | PRN
  Administered 2013-10-05: 12:00:00 via INTRAVENOUS

## 2013-10-05 MED ORDER — ONDANSETRON HCL 4 MG/2ML IJ SOLN
4.0000 mg | Freq: Once | INTRAMUSCULAR | Status: AC | PRN
  Administered 2013-10-05: 4 mg via INTRAVENOUS
  Filled 2013-10-05: qty 2

## 2013-10-05 MED ORDER — EPHEDRINE SULFATE 50 MG/ML IJ SOLN
INTRAMUSCULAR | Status: DC | PRN
  Administered 2013-10-05 (×2): 15 mg via INTRAVENOUS
  Administered 2013-10-05: 10 mg via INTRAVENOUS

## 2013-10-05 MED ORDER — IOHEXOL 350 MG/ML SOLN
INTRAVENOUS | Status: DC | PRN
  Administered 2013-10-05: 13:00:00

## 2013-10-05 MED ORDER — PROPOFOL 10 MG/ML IV BOLUS
INTRAVENOUS | Status: DC | PRN
  Administered 2013-10-05: 130 mg via INTRAVENOUS

## 2013-10-05 MED ORDER — LIDOCAINE HCL (CARDIAC) 20 MG/ML IV SOLN
INTRAVENOUS | Status: DC | PRN
  Administered 2013-10-05: 40 mg via INTRAVENOUS

## 2013-10-05 MED ORDER — DIPHENOXYLATE-ATROPINE 2.5-0.025 MG PO TABS
1.0000 | ORAL_TABLET | Freq: Once | ORAL | Status: AC
  Administered 2013-10-05: 1 via ORAL
  Filled 2013-10-05: qty 1

## 2013-10-05 MED ORDER — GLUCAGON HCL RDNA (DIAGNOSTIC) 1 MG IJ SOLR
INTRAMUSCULAR | Status: AC
  Filled 2013-10-05: qty 2

## 2013-10-05 MED ORDER — SUCCINYLCHOLINE CHLORIDE 20 MG/ML IJ SOLN
INTRAMUSCULAR | Status: DC | PRN
  Administered 2013-10-05: 100 mg via INTRAVENOUS

## 2013-10-05 MED ORDER — FENTANYL CITRATE 0.05 MG/ML IJ SOLN: 25.0000 ug | INTRAMUSCULAR | Status: DC | PRN

## 2013-10-05 MED ORDER — GLUCAGON HCL RDNA (DIAGNOSTIC) 1 MG IJ SOLR
INTRAMUSCULAR | Status: DC | PRN
  Administered 2013-10-05 (×2): 0.25 mg via INTRAVENOUS

## 2013-10-05 MED ORDER — VORTIOXETINE HBR 20 MG PO TABS: 10.0000 mg | ORAL_TABLET | Freq: Every day | ORAL | Status: DC

## 2013-10-05 MED ORDER — FENTANYL CITRATE 0.05 MG/ML IJ SOLN
INTRAMUSCULAR | Status: DC | PRN
  Administered 2013-10-05: 100 ug via INTRAVENOUS
  Administered 2013-10-05: 50 ug via INTRAVENOUS
  Administered 2013-10-05: 100 ug via INTRAVENOUS

## 2013-10-05 SURGICAL SUPPLY — 1 items: advanix biliary stent 10f x5cm ×3 IMPLANT

## 2013-10-05 NOTE — Interval H&P Note (Signed)
History and Physical Interval Note:  10/05/2013 11:11 AM  Lindsey Gomez  has presented today for surgery, with the diagnosis of Bile duct stricture  The various methods of treatment have been discussed with the patient and family. After consideration of risks, benefits and other options for treatment, the patient has consented to  Procedure(s): ENDOSCOPIC RETROGRADE CHOLANGIOPANCREATOGRAPHY (ERCP) (N/A) as a surgical intervention .  The patient's history has been reviewed, patient examined, no change in status, stable for surgery.  I have reviewed the patient's chart and labs.  Questions were answered to the patient's satisfaction.     Pricilla Riffle. Fuller Plan MD [facility] Regal

## 2013-10-05 NOTE — H&P (View-Only) (Signed)
[facility] Consult: 12:48 PM 10/02/2013  LOS: 1 day    Referring Provider: Verlon Au Primary Care Physician:  Walker Kehr, MD Primary Gastroenterologist:  Dr. Deatra Ina    Reason for Consultation:  Dilated bile duct   HPI: Lindsey Gomez is a 68 y.o. 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    The patients family states that the patient has "ups and downs" in her health, and had a recent diagnosis of aspirational PNA in June of 2015 from which she does not seem to have recovered.  For the past 3 weeks, pt has been more weak than usual and with some confusion.  She has had a decrease in appetite leading to having a diet of primarily Boost supplemental drinks.  She reports not having a BM for 3 weeks, apart from a small "ball" of fecal matter on 10/01/13. She has occasional N/V with some stomach contents and green "mucous". She also has had a cough since her PNA episode and reports some green mucous production with this as well. Pt also endorses dysphagia with a "sticking" feeling when attempting to eat solids, and lately difficulties with liquids as well. She has had a 35 pound weight loss over the past three months, and has chills without fever.    Pts daughter also reports she had a tick removed a couple of weeks ago, but has had no rash or other symptoms.  Pt also had a fall on 09/30/13 but ER work up was negative for any acute cranial process.   Past Medical History  Diagnosis Date  . Depression   . Type II or  unspecified type diabetes mellitus without mention of complication, not stated as uncontrolled   . HTN (hypertension)   . Chronic pain   . Fibromyalgia   . Migraines   . History of breast cancer     Right  . Chronic pancreatitis   . Vitamin D deficiency   . GERD (gastroesophageal reflux disease)   . Finger dislocation 2009    L 3rd  . COPD (chronic obstructive pulmonary disease)   . Anemia   . Fractured sternum 11/2008  . Osteoporosis     Reclast too expensive  . Barrett's esophagus   . Chronic fatigue   . Diverticulosis   . Opioid dependence   . Cancer     HX OF BREAST CANCER     Past Surgical History  Procedure Laterality Date  . Nasal sinus surgery    . Pancreatectomy      40%  . Cholecystectomy    . Appendectomy    . Abdominal hysterectomy    . Lobectomy Left   . Breast lumpectomy Left     Prior to Admission medications   Medication Sig Start Date End Date Taking?  Authorizing Provider  amitriptyline (ELAVIL) 50 MG tablet Take 25-50 mg by mouth at bedtime.   Yes Historical Provider, MD  atenolol (TENORMIN) 100 MG tablet Take 1 tablet (100 mg total) by mouth every morning. 05/15/13  Yes Aleksei Plotnikov V, MD  atorvastatin (LIPITOR) 10 MG tablet Take 1 tablet (10 mg total) by mouth daily. 05/15/13 05/18/14 Yes Aleksei Plotnikov V, MD  calcitRIOL (ROCALTROL) 0.5 MCG capsule Take 2 capsules (1 mcg total) by mouth daily. 05/15/13  Yes Aleksei Plotnikov V, MD  docusate sodium (COLACE) 100 MG capsule Take 100 mg by mouth daily as needed for mild constipation.   Yes Historical Provider, MD  eletriptan (RELPAX) 40 MG tablet Take 40 mg by mouth as needed for migraine or headache. One tablet by mouth at onset of headache. May repeat in 2 hours if headache persists or recurs.   Yes Historical Provider, MD  glucagon (GLUCAGON EMERGENCY) 1 MG injection Inject 1 mg into the vein once as needed (for glucose).   Yes Historical Provider, MD  hydrocortisone (CORTEF) 10 MG tablet Take 1 tablet  (10 mg total) by mouth 2 (two) times daily. 05/15/13  Yes Aleksei Plotnikov V, MD  insulin aspart (NOVOLOG) 100 UNIT/ML FlexPen Inject 4-10 Units into the skin 3 (three) times daily with meals. Inject 10 units subq with breakfast, inject 4 units subq with lunch and inject 5 units with evening meal. 01/13/13  Yes Renato Shin, MD  insulin detemir (LEVEMIR) 100 UNIT/ML injection Inject 8 Units into the skin at bedtime.  05/27/13  Yes Renato Shin, MD  LORazepam (ATIVAN) 2 MG tablet Take 2 mg by mouth every 8 (eight) hours as needed for anxiety.   Yes Historical Provider, MD  mometasone (NASONEX) 50 MCG/ACT nasal spray Place 2 sprays into both nostrils daily.   Yes Historical Provider, MD  omeprazole (PRILOSEC) 40 MG capsule Take 1 capsule (40 mg total) by mouth 2 (two) times daily. 07/17/13  Yes Aleksei Plotnikov V, MD  OxyCODONE (OXYCONTIN) 10 mg T12A 12 hr tablet Take 1 tablet (10 mg total) by mouth 3 (three) times daily. 09/10/13  Yes Aleksei Plotnikov V, MD  prochlorperazine (COMPAZINE) 10 MG tablet Take 10 mg by mouth every 8 (eight) hours as needed for nausea or vomiting.   Yes Historical Provider, MD  tamoxifen (NOLVADEX) 20 MG tablet Take 20 mg by mouth 2 (two) times daily.     Yes Historical Provider, MD  topiramate (TOPAMAX) 100 MG tablet Take 0.5 tablets (50 mg total) by mouth 2 (two) times daily. 09/10/13  Yes Aleksei Plotnikov V, MD  triamcinolone (KENALOG) 0.1 % ointment Apply 1 application topically 2 (two) times daily as needed (itching).    Yes Historical Provider, MD  Vortioxetine HBr (BRINTELLIX) 20 MG TABS Take 20 mg by mouth daily. 08/14/13  Yes Aleksei Plotnikov V, MD  zolmitriptan (ZOMIG) 5 MG tablet Take 5 mg by mouth daily as needed for migraine.    Yes Historical Provider, MD    Scheduled Meds: . atenolol  100 mg Oral [id number]  . cefTRIAXone (ROCEPHIN)  IV  1 g Intravenous Q24H  . heparin  5,000 Units Subcutaneous 3 times per day  . hydrocortisone  10 mg Oral BID  . insulin aspart  0-9  Units Subcutaneous TID WC  . insulin detemir  8 Units Subcutaneous QHS  . lactose free nutrition  237 mL Oral TID BM  . nicotine  21 mg Transdermal Daily  . OxyCODONE  10 mg Oral 3 times per  day  . pantoprazole  40 mg Oral Daily  . tamoxifen  20 mg Oral BID  . thiamine  100 mg Intravenous Daily  . Vortioxetine HBr  10 mg Oral Daily   Infusions: . sodium chloride 1,000 mL (10/02/13 0821)   PRN Meds: LORazepam, oxyCODONE, prochlorperazine, SUMAtriptan   Allergies as of 10/01/2013 - Review Complete 10/01/2013  Allergen Reaction Noted  . Gabapentin  06/04/2012  . Milnacipran  05/25/2008  . Moxifloxacin  12/11/2006  . Rizatriptan benzoate  11/09/2009  . Sulfonamide derivatives  12/11/2006  . Venlafaxine  05/09/2007    Family History  Problem Relation Age of Onset  . Colon cancer Neg Hx   . COPD Father   . Heart disease Father   . Leukemia Mother   . Diabetes Maternal Uncle     History   Social History  . Marital Status: Married    Spouse Name: [name]    Number of Children: N/A  . Years of Education: N/A   Occupational History  . 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 Smoking status: Current Every Day Smoker -- 2.00 packs/day for 40 years    Types: Cigarettes  . Smokeless tobacco: Never Used  . Alcohol Use: No  . Drug Use: No  . Sexual Activity: No   Other Topics Concern  . Not on file   Social History Narrative   Did not go back to work Oct 23, [name] is very ill - unable to have financial ends meet, no income      Regular Exercise -  NO      Coffee daily     REVIEW OF SYSTEMS: Constitutional:  + Weight loss, fatigue, weakness. ENT:  No nose bleeds Pulm:  Productive Cough (Green), no SOB. CV:  No palpitations, no LE edema.  No CP. GU:  No hematuria, no frequency GI:  Constipation.  No blood in stool or vomit. Neuro:  No headaches, no peripheral tingling or numbness Derm:  No itching, no rash or sores.  Endocrine:  + chills.  No sweats.  No polyuria or dysuria Immunization:  Up to date per pt and family. Travel:  None beyond local counties in last few months.    PHYSICAL EXAM: Vital signs in last 24 hours: Filed Vitals:   10/02/13 0500  BP: 138/75  Pulse: 74  Temp: 98.5 F (36.9 C)  Resp: 16   Wt Readings from Last 3 Encounters:  10/02/13 90 lb 6.2 oz (41 kg)  10/01/13 90 lb 8 oz (41.051 kg)  09/30/13 91 lb (41.277 kg)    General: Malnourished female  in NAD HEENT: Normocephalic. Small knot to left parietal s/p fall on 09/30/13.  Without mass or lesion.  Membranes pink and moist. Lungs:  CTA bilaterally.  Unlabored. Heart: RRR, No MGR noted Abdomen:  Soft, ND.  +BS. Negative Rebound Musc/Skeltl: No gross abnormalities Extremities: No peripheral edema.  2+ DP and radial pulses. Neurologic:  A&O to person, place, and time. Skin:  Without rash Psych:  Normal mood and affect.  Cooperative.  Intake/Output from previous day: 07/23 0701 - 07/24 0700 In: 1616.7 [I.V.:1616.7] Out: 300 [Urine:300] Intake/Output this shift:    LAB RESULTS:  Recent Labs  10/01/13 1327 10/02/13 0544  WBC 8.2 5.0  HGB 11.1* 9.4*  HCT 33.5* 28.4*  PLT 188 133*   BMET Lab Results  Component Value Date   NA 143 10/02/2013   NA 141 10/01/2013  NA 139 10/01/2013   K 3.8 10/02/2013   K 3.7 10/01/2013   K 3.8 10/01/2013   CL 107 10/02/2013   CL 104 10/01/2013   CL 99 10/01/2013   CO2 24 10/02/2013   CO2 27 10/01/2013   CO2 28 10/01/2013   GLUCOSE 73 10/02/2013   GLUCOSE 93 10/01/2013   GLUCOSE 149* 10/01/2013   BUN 35* 10/02/2013   BUN 40* 10/01/2013   BUN 41* 10/01/2013   CREATININE 1.97* 10/02/2013   CREATININE 2.13* 10/01/2013   CREATININE 2.24* 10/01/2013   CALCIUM 10.8* 10/02/2013   CALCIUM 11.9* 10/01/2013   CALCIUM 13.1* 10/01/2013   LFT  Recent Labs  10/01/13 1327 10/02/13 0544  PROT 7.1 5.7*  ALBUMIN 3.5 2.8*  AST 73* 64*  ALT 139* 101*  ALKPHOS 257* 212*  BILITOT 0.3 0.2*   PT/INR Lab Results    Component Value Date   INR 0.95 10/02/2013   INR 0.9 09/01/2010   INR 0.8 RATIO 12/03/2006   Lipase     Component Value Date/Time   LIPASE 76.0* 09/10/2013 1010    Drugs of Abuse     Component Value Date/Time   LABOPIA NEGATIVE 10/01/2013 1519   LABOPIA NONE DETECTED 08/22/2012 1825   COCAINSCRNUR NEGATIVE 10/01/2013 1519   COCAINSCRNUR NONE DETECTED 08/22/2012 1825   LABBENZ NEGATIVE 10/01/2013 1519   LABBENZ NONE DETECTED 08/22/2012 1825   AMPHETMU NEGATIVE 10/01/2013 1519   AMPHETMU NONE DETECTED 08/22/2012 1825   THCU NONE DETECTED 08/22/2012 1825   LABBARB NONE DETECTED 08/22/2012 1825     RADIOLOGY STUDIES: Dg Chest 2 View  10/01/2013   CLINICAL DATA:  Fatigue.  Weakness.  EXAM: CHEST  2 VIEW  COMPARISON:  08/14/2013.  08/22/2012.  FINDINGS: Improved RIGHT middle lobe airspace disease compatible with treated pneumonia. Chronic pulmonary parenchymal scarring and postsurgical changes. Cardiopericardial silhouette appears within normal limits. Chronic volume loss in the LEFT lung and elevation of the LEFT hemidiaphragm.  IMPRESSION: Improved RIGHT middle lobe airspace disease consistent with treated pneumonia. Lower lung volumes than on prior. Chronic pulmonary parenchymal scarring.   Electronically Signed   By: Dereck Ligas M.D.   On: 10/01/2013 14:43   Ct Head Wo Contrast  10/01/2013   CLINICAL DATA:  Fatigue and weakness  EXAM: CT HEAD WITHOUT CONTRAST  TECHNIQUE: Contiguous axial images were obtained from the base of the skull through the vertex without intravenous contrast.  COMPARISON:  CT head 05/06/2013  FINDINGS: Generalized atrophy with mild chronic microvascular ischemia  Negative for acute infarct.  Negative for hemorrhage or mass.  Extensive sinus surgery on the left. Extensive bony thickening of the left maxillary sinus.  IMPRESSION: Atrophy and mild chronic microvascular ischemia. No acute abnormality.   Electronically Signed   By: Franchot Gallo M.D.   On: 10/01/2013 15:18    US Abdomen Complete  10/02/2013   CLINICAL DATA:  Abdominal pain.  History of a cholecystectomy.  EXAM: ULTRASOUND ABDOMEN COMPLETE  COMPARISON:  CT, 10/13/2003  FINDINGS: Gallbladder:  Surgically removed  Common bile duct:  Diameter: 15.9 mm. No duct stone is seen. Most distal aspect of the duct was not visualized.  Liver:  Intrahepatic bile duct dilation. Liver normal in size in echogenicity. No liver mass. Normal hepatopetal flow in the portal vein.  IVC:  No abnormality visualized.  Pancreas:  Not well visualized. Much of the lower pancreatic head was obscured by bowel gas.  Spleen:  Size and appearance within normal limits.  Right Kidney:  Length:  11.6 cm. Normal parenchymal echogenicity. Diffuse cortical thinning. Small cyst with some internal echoes in the lower pole measuring 1 cm. No other renal masses. No hydronephrosis.  Left Kidney:  Length: 11.5 cm. There is cortical thinning. Normal parenchymal echogenicity. 10 mm exophytic cysts from the lateral margin of the mid to lower pole with a thin septation. 11 mm simple appearing upper pole cyst. No other renal masses. No hydronephrosis.  Abdominal aorta:  No aneurysm visualized.  Other findings:  None.  IMPRESSION: 1. There is significant intern extrahepatic bile duct dilation of unclear etiology. The duct measures 15.9 mm in diameter, which is a significant increase in caliber from the prior CT. The most distal aspect of the duct, and much of the pancreatic head, was not visualized due to overlying bowel gas. A distal duct stone or a pancreatic mass is not excluded. Recommend followup ERCP or MRI/MRCP. If MRI is chosen, recommend pancreatic protocol MRI with contrast with MRCP. 2. Status post cholecystectomy. 3. Small renal cysts and bilateral renal cortical thinning. 4. No other abnormalities.   Electronically Signed   By: Lajean Manes M.D.   On: 10/02/2013 11:52   Dg Abd 2 Views  10/01/2013   CLINICAL DATA:  History of pancreatectomy, fatigue   EXAM: ABDOMEN - 2 VIEW  COMPARISON:  None.  FINDINGS: Moderate amount of stool throughout the colon. Surgical clips are scattered primarily in the left upper quadrant. There is no bowel dilatation to suggest obstruction. There is no evidence of pneumoperitoneum, portal venous gas or pneumatosis. There are no pathologic calcifications along the expected course of the ureters.  Mild osteoarthritis of bilateral hips.  IMPRESSION: No bowel obstruction.  Moderate amount of stool within the colon.   Electronically Signed   By: Kathreen Devoid   On: 10/01/2013 16:13    ENDOSCOPIC STUDIES: EGD by Dr Erskine Emery Sep 05, 2010 with Barret's Esophagus Normal Colonoscopy 07/27/2004 by Dr. Rachelle Hora  IMPRESSION:   * Dilated Bile Duct - May be 2/2 to stone or other obstructive process. MRCP will give better idea.  May need ERCP if stone found. * Hypercalcemia - Corrected since entry. May have been contributing or causing of her symptoms as she has seen some improvement since admission. * Constipation - Chronic 2/2 opiate use.  No obstructive process noted on ABD x ray on 10/01/13.  Due for Colonoscopy but may benefit from other imaging (CT or Barium Enema) in the meantime. * Dysphagia - Hx of Barret's with no f/u after most recent EGD on 04-Sep-2013.  Pt may benefit from follow up EGD given symptoms.  PLAN:     - MRCP ordered for better visualization of stone vs mass. Will need ERCP if + for stones. - Recommended date for repeat colonoscopy was 2012.  Will consult with MD Outpatient vs Inpatient.   Lollie Marrow  10/02/2013, 12:48 PM Lawrence Marseilles, PA-Student [place] Pager: [phone]  [facility] Attending  I have also seen and assessed the patient and agree with the above note. I have personally seen the patient, reviewed and repeated key elements of the history and physical and participated in formation of the assessment and plan the student has documented.  Gatha Mayer, MD, [facility]  Gastroenterology [phone] (pager) 10/02/2013 4:04 PM

## 2013-10-05 NOTE — Discharge Summary (Signed)
Physician Discharge Summary    Lindsey Gomez FQH:[id number] DOB: [birth year] DOA: 10/01/2013  THIS DICTATION TO BE AMMENDED AND REVIEWED BY D/C MD PRIOR TO D/C  PCP: Walker Kehr, MD  Admit date: 10/01/2013 Discharge date: 10/05/2013  Time spent: 45 minutes  Recommendations for Outpatient Follow-up:  1. Recommend close followup with gastroenterologist-consideration to be given for endoscopic ultrasound soon.   They will contact the patient hopefully with result of brushings for cytology on ERCP  2. Gradually diet as tolerated-if not able to tolerate, will continue boost supplements 3. Consider outpatient mammogram according to screening guidelines 4. Complete 2 days more of Macrodantin for her Citrobacter Koseri pyelonephritis stop date 7/29 5. Recommend LFTs and CBC + INR in about one week 6. Recommend simplification of polypharmacy specifically multiple pain medications 7. Consider discontinuation of Brintellix which has a 20-30% risk of nausea vomiting listed under adverse effects    Discharge Diagnoses:  Principal Problem:   Toxic metabolic encephalopathy Active Problems:   ANXIETY   TOBACCO USE DISORDER/SMOKER-SMOKING CESSATION DISCUSSED   MIGRAINE VARIANTS, W/INTRACTABLE MIGRAINE   COPD   FIBROMYALGIA   TRAUMAT HEMOTHOR W/O MENTION OPEN WOUND IN THOR   Barrett's esophagus   Opioid dependence   Protein-calorie malnutrition, severe   Dilated bile duct   Common bile duct (CBD) stricture   Nonspecific elevation of levels of transaminase or lactic acid dehydrogenase (LDH)   Discharge Condition: Fair  Diet recommendation: Advance diet as tolerated  Filed Weights   10/03/13 0459 10/04/13 0501 10/05/13 0555  Weight: 41 kg (90 lb 6.2 oz) 41 kg (90 lb 6.2 oz) 43.364 kg (95 lb 9.6 oz)    History of present illness:  68 y/o ?, known well documented chronic pain on opiates, fibromyalgia ty 1 DM, Prior  pancreatitis c h/o Pancreatectomy, migraine, COPD,Gerd+ prior history  Barrett's esophagus 09/05/2010 follwed by Dr. Deatra Ina, prior h/o Breast CA? 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 Metabolic Encephalopathy-ammonia noT elevated. Secondary to chronic pain medications +mild acute kidney injury. Improving . See below 2. Hypokalemia-unclear etiology. Monitor. Replacing IV 6 runs, added oral potassium as well a 40 Kdur twice a day. Continue replacement 3. Citrobacter pyelonephritis-no fever Cover with ceftriaxone 1 g every 24 for now-transitioned on discharge to Macrodantin 100 twice a day, start date 7/29 4. Chronic pain-patient in no significant pain currently and seems comfortable however asking for chronic meds. Patient has been seen chronically for this issue and I have discussed with family that discussion regarding chronic long-term medications should be carried out with primary care physician. I will not change her discharge pain regimen nor anxiety regimen 5. Cough-unclear etiology-? Bronchitis from chronic smoking-chest x-ray 7/23 resolving pneumonia this has resolved. 6. Posttussive nausea-component of her antidepressant Brintellix which was cut back from 20-10 mg on admission. We may need to discontinued this as an outpatient.she will go home on 10 mg dose  7. Hepatic derangement DDX-primary biliary cirrhosis vs.  primary sclerosing cholangitis ?-ultrasound abdomen pelvis 7/24 suggestive of biliary duct dilatation 15.9 millimeters. MRCP 7/24 =? Biliary stricture. -ERCP  performed  10/05/13-- as an outpatient brushings need to be followed up.  To have close followup with gastroenterology Dr. Ardis Hughs for consideration EUS  8. Fibromyalgia-see above discussion 9. Anxiety see above discussion  10. History breast cancer on tamoxifen-monitor. LFTs likely represent frank obstructive assess secondary to elevated alk phosphatase-tamoxifen is not likely to be of concern in this case Need to recheck mammogram as an outpatient 11. Hypercalcemia-was on vitamin D which makes specific diagnosis challenging. Corrected calcium =9.5. No further workup 12. hypertension continue atenolol 50 daily 13. 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 Opiate-induced Constipation-will add sorbitol 30 mils twice a day until diarrhea and needs of her bowel regime as an outpatient  15. Adrenal insufficiency and continue -continue Cortef 10 mg twice a dayAs    Procedures: ENDOSCOPIC IMPRESSION:  1. Mildly prominent and mildly edematous major ampulla  2. Marked, diffuse dilation of the common bile duct and  intrahepatic ducts  3. Short stricture in the distal common bile duct; brushings for  cytology obtained, under endoscopic and fluoroscopic guidance a 10  fr x 5 cm biliary sent placement was performed   Consultations:  Gastroenterology   Discharge Exam: Filed Vitals:   10/05/13 1410  BP: 159/82  Pulse: 59  Temp: 98 F (36.7 C)  Resp: 16    tolerated somewhat of a heavier diet past couple of days including tomato soup etc. No specific pain or other issues at present  General: Alert doesn't oriented tolerating by mouth liquids . Emaciated  Cardiovascular: S1-S2 no murmur rub or gallop   Respiratory: Clear   no abdominal pain or distention   Discharge Instructions You were cared for by a hospitalist during your hospital stay. If you have any questions about your discharge medications or the care you received while you were in the hospital after you are discharged, you can call the unit and asked to speak with the hospitalist on call if the hospitalist that took care of you is not available. Once you are discharged, your primary care physician will handle any further medical issues. Please note that NO REFILLS for any discharge medications will be authorized once you are discharged, as it is imperative that you return to your primary care physician (or establish a relationship with a primary care physician if you do not have one) for your aftercare needs so that they can reassess your need for medications and monitor your lab values.  Discharge Instructions   Diet - low sodium heart healthy    Complete by:  As directed      Discharge instructions    Complete by:  As directed   We are not very sure why you have your nausea-You were seen by a Gastroenterologist and he had a stent placed in your liver.  This was to see if this would help with her nausea and vomiting. There is a possibility that he may have either complications from your pancreatectomy many years ago or an underlying pancreas tumor. We have done all that we can and hospital to work this up Dr. Ardis Hughs will need to reevaluate you for an endoscopic ultrasound test which can give [place] an idea as to what may be going on in the future  If you do not tolerate a regular diet, we are recommending that he continue on your most supplements until he was seen by him     Increase activity slowly    Complete by:  As directed             Medication List         amitriptyline 50 MG tablet  Commonly known as:  ELAVIL  Take 25-50 mg by mouth at bedtime.     atenolol 100 MG tablet  Commonly known as:  TENORMIN  Take 1 tablet  (100 mg total) by mouth every morning.     atorvastatin 10 MG tablet  Commonly known as:  LIPITOR  Take 1 tablet (10 mg total) by mouth daily.     calcitRIOL 0.5 MCG capsule  Commonly known as:  ROCALTROL  Take 2 capsules (1 mcg total) by mouth daily.     docusate sodium 100 MG capsule  Commonly known as:  COLACE  Take 100 mg by mouth daily as needed for mild constipation.     eletriptan 40 MG tablet  Commonly known as:  RELPAX  Take 40 mg by mouth as needed for migraine or headache. One tablet by mouth at onset of headache. May repeat in 2 hours if headache persists or recurs.     GLUCAGON EMERGENCY 1 MG injection  Generic drug:  glucagon  Inject 1 mg into the vein once as needed (for glucose).     hydrocortisone 10 MG tablet  Commonly known as:  CORTEF  Take 1 tablet (10 mg total) by mouth 2 (two) times daily.     insulin aspart 100 UNIT/ML FlexPen  Commonly known as:  NOVOLOG  Inject 4-10 Units into the skin 3 (three) times daily with meals. Inject 10 units subq with breakfast, inject 4 units subq with lunch and inject 5 units with evening meal.     insulin detemir 100 UNIT/ML injection  Commonly known as:  LEVEMIR  Inject 8 Units into the skin at bedtime.     LORazepam 2 MG tablet  Commonly known as:  ATIVAN  Take 2 mg by mouth every 8 (eight) hours as needed for anxiety.     mometasone 50 MCG/ACT nasal spray  Commonly known as:  NASONEX  Place 2 sprays into both nostrils daily.     omeprazole 40 MG capsule  Commonly known as:  PRILOSEC  Take 1 capsule (40 mg total) by mouth 2 (two) times daily.     OxyCODONE 10 mg T12a 12 hr tablet  Commonly known as:  OXYCONTIN  Take 1 tablet (10 mg total) by mouth 3 (three) times daily.     prochlorperazine 10 MG tablet  Commonly known as:  COMPAZINE  Take 10 mg by mouth every 8 (eight) hours as needed for nausea or vomiting.     tamoxifen 20 MG tablet  Commonly known as:  NOLVADEX  Take 20 mg by mouth 2 (two) times  daily.     topiramate 100 MG tablet  Commonly known as:  TOPAMAX  Take 0.5 tablets (50 mg total) by mouth 2 (two) times daily.     triamcinolone ointment 0.1 %  Commonly known as:  KENALOG  Apply 1 application topically 2 (two) times daily as needed (itching).     Vortioxetine HBr 20 MG Tabs  Commonly known as:  BRINTELLIX  Take 10 mg by mouth daily.     zolmitriptan 5 MG tablet  Commonly known as:  ZOMIG  Take 5 mg by mouth daily as needed for migraine.       Allergies  Allergen Reactions  . Milnacipran Swelling    Headache and hand swelling   . Rizatriptan Benzoate Nausea And Vomiting  . Gabapentin Other (See Comments)    Weak muscles  . Moxifloxacin Other (See Comments)    Unknown  . Sulfonamide Derivatives Rash  . Venlafaxine Other (See Comments)    Unknown      The results of significant diagnostics from this hospitalization (including imaging, microbiology, ancillary and laboratory) are listed below for reference.    Significant Diagnostic Studies: Dg Chest 2 View  10/01/2013   CLINICAL DATA:  Fatigue.  Weakness.  EXAM: CHEST  2 VIEW  COMPARISON:  08/14/2013.  08/22/2012.  FINDINGS: Improved RIGHT middle lobe airspace disease compatible with treated pneumonia. Chronic pulmonary parenchymal scarring and postsurgical changes. Cardiopericardial silhouette appears within normal limits. Chronic volume loss in the LEFT lung and elevation of the LEFT hemidiaphragm.  IMPRESSION: Improved RIGHT middle lobe airspace disease consistent with treated pneumonia. Lower lung volumes than on prior. Chronic pulmonary parenchymal scarring.   Electronically Signed   By: Dereck Ligas M.D.   On: 10/01/2013 14:43   Ct Head Wo Contrast  10/01/2013   CLINICAL DATA:  Fatigue and weakness  EXAM: CT HEAD WITHOUT CONTRAST  TECHNIQUE: Contiguous axial images were obtained from the base of the skull through the vertex without intravenous contrast.  COMPARISON:  CT head 05/06/2013  FINDINGS:  Generalized atrophy with mild chronic microvascular ischemia  Negative for acute infarct.  Negative for hemorrhage or mass.  Extensive sinus surgery on the left. Extensive bony thickening of the left maxillary sinus.  IMPRESSION: Atrophy and mild chronic microvascular ischemia. No acute abnormality.   Electronically Signed   By: Franchot Gallo M.D.   On: 10/01/2013 15:18   US Abdomen Complete  10/02/2013   CLINICAL DATA:  Abdominal pain.  History of a cholecystectomy.  EXAM: ULTRASOUND ABDOMEN COMPLETE  COMPARISON:  CT, 10/13/2003  FINDINGS: Gallbladder:  Surgically removed  Common bile duct:  Diameter: 15.9 mm. No duct stone is seen. Most distal aspect of the duct was not visualized.  Liver:  Intrahepatic bile duct dilation. Liver normal in size in echogenicity. No liver mass. Normal hepatopetal flow in the portal vein.  IVC:  No abnormality visualized.  Pancreas:  Not well visualized. Much of the lower pancreatic head was obscured by bowel gas.  Spleen:  Size and appearance within normal limits.  Right Kidney:  Length: 11.6 cm. Normal parenchymal echogenicity. Diffuse cortical thinning. Small cyst with some internal echoes in the lower pole measuring 1 cm. No other renal masses. No hydronephrosis.  Left Kidney:  Length: 11.5 cm. There is cortical thinning. Normal parenchymal echogenicity. 10 mm exophytic cysts from the lateral margin of the mid to lower pole with a thin septation. 11 mm simple appearing upper pole cyst. No other renal masses. No hydronephrosis.  Abdominal aorta:  No aneurysm visualized.  Other findings:  None.  IMPRESSION: 1. There is significant intern extrahepatic bile duct dilation of unclear etiology. The duct measures 15.9 mm in diameter, which is a significant increase in caliber from the prior CT. The most distal aspect of the duct, and much of the pancreatic head, was not visualized due to overlying bowel gas. A distal duct stone or a pancreatic mass is not excluded. Recommend followup  ERCP or MRI/MRCP. If MRI is chosen, recommend pancreatic protocol MRI with contrast with MRCP.  2. Status post cholecystectomy. 3. Small renal cysts and bilateral renal cortical thinning. 4. No other abnormalities.   Electronically Signed   By: Lajean Manes M.D.   On: 10/02/2013 11:52   Mr Abdomen Mrcp Wo Cm  10/02/2013   CLINICAL DATA:  Dilated bile ducts noted on recent ultrasound examination.  EXAM: MRI ABDOMEN WITHOUT  (INCLUDING MRCP)  TECHNIQUE: Multiplanar multisequence MR imaging of the abdomen was performed. Heavily T2-weighted images of the biliary and pancreatic ducts were obtained, and three-dimensional MRCP images were rendered by post processing.  COMPARISON:  Abdominal ultrasound 10/02/2013.  FINDINGS: Severe intra and extrahepatic biliary ductal dilatation is noted, with both the common bile duct and the common hepatic duct measuring up to 18 mm in diameter. The cystic duct remnant is also dilated. Patient is status post cholecystectomy. There are no filling defects within the bile ducts to suggest the presence of retained ductal stones. The distal common bile duct abruptly tapers immediately before the level of the ampulla. The pancreatic duct is not dilated. The region of the pancreatic head is poorly evaluated on today's non contrast CT examination. No large pancreatic head mass is identified. A small ampullary lesion causing obstruction cannot be excluded on the basis of today's examination. No focal hepatic lesions are noted on today's noncontrast study.  The unenhanced appearance of the pancreas, spleen and bilateral adrenal glands is unremarkable. There are numerous renal lesions bilaterally, which are predominantly low signal intensity on T1 weighted images and high signal intensity on T2 weighted images, most likely to represent cysts (incompletely characterized on today's noncontrast CT examination). In addition, several renal lesions demonstrate some dependent high T1 signal intensity,  compatible with proteinaceous or hemorrhagic contents.  IMPRESSION: 1. Severe intra and extrahepatic biliary ductal dilatation redemonstrated. This abruptly terminates in the distal common bile duct immediately before the level of the ampulla. 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 The possibility of a tiny obstructing ampullary mass is not excluded on today's noncontrast examination. No large obstructing pancreatic head mass is noted. Correlation with ERCP may provide additional diagnostic information if clinically appropriate. 2. Additional incidental findings, as above.   Electronically Signed   By: Vinnie Langton M.D.   On: 10/02/2013 20:19   Mr 3d Recon At Scanner  10/02/2013   CLINICAL DATA:  Dilated bile ducts noted on recent ultrasound examination.  EXAM: MRI ABDOMEN WITHOUT  (INCLUDING MRCP)  TECHNIQUE: Multiplanar multisequence MR imaging of the abdomen was performed. Heavily T2-weighted images of the biliary and pancreatic ducts were obtained, and three-dimensional MRCP images were rendered by post processing.  COMPARISON:  Abdominal ultrasound 10/02/2013.  FINDINGS: Severe intra and extrahepatic biliary ductal dilatation is noted, with both the common bile duct and the common hepatic duct measuring up to 18 mm in diameter. The cystic duct remnant is also dilated. Patient is status post cholecystectomy. There are no filling defects within the bile ducts to suggest the presence of retained ductal stones. The distal common bile duct abruptly tapers immediately before the level of the ampulla. The pancreatic duct is not dilated. The region of the pancreatic head is poorly evaluated on today's non contrast CT examination. No large pancreatic head mass is identified. A small ampullary lesion causing obstruction cannot be excluded on the basis of today's examination. No focal hepatic lesions are noted on today's noncontrast  study.  The unenhanced appearance of the pancreas, spleen and bilateral adrenal  glands is unremarkable. There are numerous renal lesions bilaterally, which are predominantly low signal intensity on T1 weighted images and high signal intensity on T2 weighted images, most likely to represent cysts (incompletely characterized on today's noncontrast CT examination). In addition, several renal lesions demonstrate some dependent high T1 signal intensity, compatible with proteinaceous or hemorrhagic contents.  IMPRESSION: 1. Severe intra and extrahepatic biliary ductal dilatation redemonstrated. This abruptly terminates in the distal common bile duct immediately before the level of the ampulla. 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 The possibility of a tiny obstructing ampullary mass is not excluded on today's noncontrast examination. No large obstructing pancreatic head mass is noted. Correlation with ERCP may provide additional diagnostic information if clinically appropriate. 2. Additional incidental findings, as above.   Electronically Signed   By: Vinnie Langton M.D.   On: 10/02/2013 20:19   Dg Ercp  10/05/2013   CLINICAL DATA:  Biliary drug stricture  EXAM: ERCP  TECHNIQUE: Multiple spot images obtained with the fluoroscopic device and submitted for interpretation post-procedure.  COMPARISON:  None.  FINDINGS: The series of images demonstrate severe dilatation of the mid and upper common bile duct and marked narrowing of the distal common bile duct. The final image demonstrates a stent placed across the distal common bile duct.  IMPRESSION: Biliary stent placement.  These images were submitted for radiologic interpretation only. Please see the procedural report for the amount of contrast and the fluoroscopy time utilized.   Electronically Signed   By: Maryclare Bean M.D.   On: 10/05/2013 15:02   Dg Abd 2 Views  10/01/2013   CLINICAL  DATA:  History of pancreatectomy, fatigue  EXAM: ABDOMEN - 2 VIEW  COMPARISON:  None.  FINDINGS: Moderate amount of stool throughout the colon. Surgical clips are scattered primarily in the left upper quadrant. There is no bowel dilatation to suggest obstruction. There is no evidence of pneumoperitoneum, portal venous gas or pneumatosis. There are no pathologic calcifications along the expected course of the ureters.  Mild osteoarthritis of bilateral hips.  IMPRESSION: No bowel obstruction.  Moderate amount of stool within the colon.   Electronically Signed   By: Kathreen Devoid   On: 10/01/2013 16:13    Microbiology: Recent Results (from the past 240 hour(s))  URINE CULTURE     Status: None   Collection Time    10/02/13 10:28 AM      Result Value Ref Range Status   Specimen Description URINE, CLEAN CATCH   Final   Special Requests NONE   Final   Culture  Setup Time     Final   Value: 10/02/2013 10:55     Performed at [facility]     Final   Value: >=100,000 COLONIES/ML     Performed at [facility]   Culture     Final   Value: CITROBACTER KOSERI     Performed at [facility]   Report Status 10/04/2013 FINAL   Final   Organism ID, Bacteria CITROBACTER KOSERI   Final     Labs: Basic Metabolic Panel:  Recent Labs Lab 10/01/13 1524 10/02/13 0544 10/03/13 0431 10/04/13 0900 10/05/13 0620  NA 141 143 145 144 146  K 3.7 3.8 2.9* 3.5* 4.0  CL 104 107 109 111 114*  CO2 27 24 23 20 20   GLUCOSE 93 73 112* 111* 113*  BUN 40* 35* 28*  17 13  CREATININE 2.13* 1.97* 1.73* 1.21* 1.05  CALCIUM 11.9* 10.8* 9.6 8.5 8.0*  MG  --   --  1.9  --   --    Liver Function Tests:  Recent Labs Lab 10/01/13 1327 10/02/13 0544 10/03/13 0431 10/04/13 0900 10/05/13 0620  AST 73* 64* 31 27 22   ALT 139* 101* 67* 56* 48*  ALKPHOS 257* 212* 178* 163* 149*  BILITOT 0.3 0.2* <0.2* <0.2* <0.2*  PROT 7.1 5.7* 5.4* 5.8* 5.9*  ALBUMIN 3.5 2.8* 2.5* 2.8* 2.9*   No  results found for this basename: LIPASE, AMYLASE,  in the last 168 hours  Recent Labs Lab 10/01/13 1524  AMMONIA 21   CBC:  Recent Labs Lab 10/01/13 1327 10/02/13 0544 10/03/13 0431  WBC 8.2 5.0 4.4  NEUTROABS 4.4  --  2.2  HGB 11.1* 9.4* 9.3*  HCT 33.5* 28.4* 27.7*  MCV 91.0 90.4 89.9  PLT 188 133* 118*   Cardiac Enzymes: No results found for this basename: CKTOTAL, CKMB, CKMBINDEX, TROPONINI,  in the last 168 hours BNP: BNP (last 3 results) No results found for this basename: PROBNP,  in the last 8760 hours CBG:  Recent Labs Lab 10/04/13 1152 10/04/13 1738 10/04/13 2128 10/05/13 0802 10/05/13 1459  GLUCAP 254* 247* 203* 99 140*       Signed:  Samanvitha Gomez, JAI-GURMUKH  [facility] 10/05/2013, 3:28 PM

## 2013-10-05 NOTE — Progress Notes (Signed)
Patient informed RN that she recently took 200 mg of Caffeine that she keeps at bedside because "I was starting to get a headache, and I am going to drink coffee until I go to bed." RN informed patient that she cannot have anything else to drink due to her diet orders. Will continue to monitor.

## 2013-10-05 NOTE — Progress Notes (Signed)
Inpatient Diabetes Program Recommendations  AACE/ADA: New Consensus Statement on Inpatient Glycemic Control (2013)  Target Ranges:  Prepandial:   less than 140 mg/dL      Peak postprandial:   less than 180 mg/dL (1-2 hours)      Critically ill patients:  140 - 180 mg/dL   Reason for Assessment:  Results for Lindsey Gomez, Lindsey Gomez (MRN [id number]) as of 10/05/2013 13:44  Ref. Range 10/04/2013 07:59 10/04/2013 11:52 10/04/2013 17:38 10/04/2013 21:28 10/05/2013 08:02  Glucose-Capillary Latest Range: 70-99 mg/dL 71 254 (H) 247 (H) 203 (H) 99   Diabetes history: Type 1 diabetes Outpatient Diabetes medications: Levemir 8 units, Novolog 10 units with breakfast, 4 units with lunch, and 5 units with supper Current orders for Inpatient glycemic control:  Levemir 8 units q HS, Novolog sensitive tid with meals.  Once patient restarts diet, may consider adding Novolog 3 units tid with meals.  Thanks, Adah Perl, RN, BC-ADM Inpatient Diabetes Coordinator Pager [phone]

## 2013-10-05 NOTE — Progress Notes (Signed)
Pt has continous loose bowel movement , 4-5 times in an hour. Reviewed  Pt's med and pt is on sorbitol twice daily with meals. Baltazar Najjar, NP notified. Awaiting for order.

## 2013-10-05 NOTE — Anesthesia Procedure Notes (Signed)
Procedure Name: Intubation Date/Time: 10/05/2013 11:51 AM Performed by: Trixie Deis A Pre-anesthesia Checklist: Patient identified, Patient being monitored, Emergency Drugs available, Timeout performed and Suction available Patient Re-evaluated:Patient Re-evaluated prior to inductionOxygen Delivery Method: Circle system utilized Preoxygenation: Pre-oxygenation with 100% oxygen Intubation Type: IV induction Ventilation: Mask ventilation without difficulty Laryngoscope Size: Miller and 2 Grade View: Grade I Tube type: Oral Tube size: 7.0 mm Number of attempts: 1 Airway Equipment and Method: Stylet Secured at: 21 cm Tube secured with: Tape Dental Injury: Teeth and Oropharynx as per pre-operative assessment

## 2013-10-05 NOTE — Transfer of Care (Signed)
Immediate Anesthesia Transfer of Care Note  Patient: Lindsey Gomez  Procedure(s) Performed: Procedure(s): ENDOSCOPIC RETROGRADE CHOLANGIOPANCREATOGRAPHY (ERCP) (N/A)  Patient Location: PACU  Anesthesia Type:General  Level of Consciousness: awake, alert  and oriented  Airway & Oxygen Therapy: Patient Spontanous Breathing and Patient connected to nasal cannula oxygen  Post-op Assessment: Report given to PACU RN, Post -op Vital signs reviewed and stable and Patient moving all extremities  Post vital signs: Reviewed and stable  Complications: No apparent anesthesia complications

## 2013-10-05 NOTE — Op Note (Signed)
[facility] [place], [id number]   ERCP PROCEDURE REPORT  PATIENT: Lindsey Gomez, Lindsey Gomez.  MR# :[id number] BIRTHDATE: [birth year]  GENDER: Female ENDOSCOPIST: Ladene Artist, MD, [facility] REFERRED BY: [facility] PROCEDURE DATE:  10/05/2013 PROCEDURE:   ERCP with stent placement ASA CLASS:   Class III INDICATIONS:bile duct stricture.   abnormal MRCP. MEDICATIONS: General endotracheal anesthesia (GETA) TOPICAL ANESTHETIC: none DESCRIPTION OF PROCEDURE:   After the risks benefits and alternatives of the procedure were thoroughly explained, informed consent was obtained.  The [id number]-[id number] ([id number]) and [id number] ([id number])  endoscope was introduced through the mouth  and advanced to the second portion of the duodenum . Intubation of the esophagus was mildly difficult.  1.  The ampulla was located in the second portion of the duodenum, it was mild prominent, mildly edematous. 2.  There was marked, diffuse dilation of the common bile duct and intrahepatic ducts. The right intrahepatic ducts were not well filled. 3.  A short, stricture was noted in the distal common bile duct. Brushings for cytology obtained. Mild friability noted. 4.  Under endoscopic and fluoroscopic guidance a 10 Fr x 5 cm double flap stent was placed in the bile duct with excellent biliary drainage noted. The scope was then completely withdrawn from the patient and the procedure terminated.     COMPLICATIONS:  ENDOSCOPIC IMPRESSION: 1.   Mildly prominent and mildly edematous major ampulla 2.   Marked, diffuse dilation of the common bile duct and intrahepatic ducts 3.  Short stricture in the distal common bile duct; brushings for cytology obtained, under endoscopic and fluoroscopic guidance a 10 fr x 5 cm  biliary sent placement was performed  RECOMMENDATIONS: 1.  await cytology results 2.  follow-up: GI clinic 1 month with Dr. Erskine Emery. Consider EUS.   eSigned:   Ladene Artist, MD, [facility] 10/05/2013 12:42 PM   CC: Erskine Emery, MD

## 2013-10-05 NOTE — Anesthesia Postprocedure Evaluation (Signed)
  Anesthesia Post-op Note  Patient: Lindsey Gomez  Procedure(s) Performed: Procedure(s): ENDOSCOPIC RETROGRADE CHOLANGIOPANCREATOGRAPHY (ERCP) (N/A)  Patient Location: PACU and Endoscopy Unit  Anesthesia Type:General  Level of Consciousness: awake, alert , oriented and patient cooperative  Airway and Oxygen Therapy: Patient Spontanous Breathing and Patient connected to nasal cannula oxygen  Post-op Pain: none  Post-op Assessment: Post-op Vital signs reviewed, Patient's Cardiovascular Status Stable, Respiratory Function Stable, Patent Airway, No signs of Nausea or vomiting and Pain level controlled  Post-op Vital Signs: Reviewed and stable  Last Vitals:  Filed Vitals:   10/05/13 1305  BP: 180/134  Pulse: 72  Temp:   Resp: 18    Complications: No apparent anesthesia complications

## 2013-10-05 NOTE — Addendum Note (Signed)
Addendum created 10/05/13 1318 by Leonor Liv, CRNA   Modules edited: Anesthesia Medication Administration

## 2013-10-05 NOTE — Anesthesia Preprocedure Evaluation (Addendum)
Anesthesia Evaluation  Patient identified by MRN, date of birth, ID band Patient awake    Reviewed: Allergy & Precautions, H&P , NPO status , Patient's Chart, lab work & pertinent test results  Airway Mallampati: II TM Distance: >3 FB Neck ROM: Full    Dental  (+) Edentulous Upper, Edentulous Lower   Pulmonary COPDCurrent Smoker,  breath sounds clear to auscultation        Cardiovascular hypertension, Rhythm:Regular Rate:Normal     Neuro/Psych  Headaches, PSYCHIATRIC DISORDERS Anxiety Depression Schizophrenia  Neuromuscular disease    GI/Hepatic GERD-  ,  Endo/Other  diabetes  Renal/GU      Musculoskeletal   Abdominal   Peds  Hematology  (+) Blood dyscrasia, anemia ,   Anesthesia Other Findings   Reproductive/Obstetrics                         Anesthesia Physical Anesthesia Plan  ASA: II  Anesthesia Plan: General   Post-op Pain Management:    Induction: Intravenous  Airway Management Planned: Oral ETT  Additional Equipment:   Intra-op Plan:   Post-operative Plan:   Informed Consent: I have reviewed the patients History and Physical, chart, labs and discussed the procedure including the risks, benefits and alternatives for the proposed anesthesia with the patient or authorized representative who has indicated his/her understanding and acceptance.     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

## 2013-10-06 ENCOUNTER — Encounter: Payer: Self-pay | Admitting: Gastroenterology

## 2013-10-06 ENCOUNTER — Inpatient Hospital Stay (HOSPITAL_COMMUNITY): Payer: Medicare Other

## 2013-10-06 ENCOUNTER — Encounter (HOSPITAL_COMMUNITY): Payer: Self-pay | Admitting: Gastroenterology

## 2013-10-06 LAB — CLOSTRIDIUM DIFFICILE BY PCR: Toxigenic C. Difficile by PCR: NEGATIVE

## 2013-10-06 LAB — GLUCOSE, CAPILLARY
GLUCOSE-CAPILLARY: 243 mg/dL — AB (ref 70–99)
Glucose-Capillary: 84 mg/dL (ref 70–99)
Glucose-Capillary: 193 mg/dL — ABNORMAL HIGH (ref 70–99)
Glucose-Capillary: 121 mg/dL — ABNORMAL HIGH (ref 70–99)
Glucose-Capillary: 248 mg/dL — ABNORMAL HIGH (ref 70–99)
Glucose-Capillary: 195 mg/dL — ABNORMAL HIGH (ref 70–99)

## 2013-10-06 LAB — BASIC METABOLIC PANEL
Anion gap: 12 (ref 5–15)
BUN: 13 mg/dL (ref 6–23)
CO2: 18 mEq/L — ABNORMAL LOW (ref 19–32)
Calcium: 7.5 mg/dL — ABNORMAL LOW (ref 8.4–10.5)
Chloride: 112 mEq/L (ref 96–112)
Creatinine, Ser: 1.01 mg/dL (ref 0.50–1.10)
GFR calc Af Amer: 66 mL/min — ABNORMAL LOW (ref >90)
GFR, EST NON AFRICAN AMERICAN: 57 mL/min — AB (ref >90)
GLUCOSE: 211 mg/dL — AB (ref 70–99)
Potassium: 3.2 mEq/L — ABNORMAL LOW (ref 3.7–5.3)
Sodium: 142 mEq/L (ref 137–147)

## 2013-10-06 LAB — MAGNESIUM: Magnesium: 1.5 mg/dL (ref 1.5–2.5)

## 2013-10-06 MED ORDER — LOPERAMIDE HCL 2 MG PO TABS: 2.0000 mg | ORAL_TABLET | Freq: Four times a day (QID) | ORAL | Status: DC | PRN

## 2013-10-06 MED ORDER — POTASSIUM CHLORIDE ER 20 MEQ PO TBCR: 10.0000 meq | EXTENDED_RELEASE_TABLET | Freq: Every day | ORAL | Status: DC

## 2013-10-06 MED ORDER — LORAZEPAM 1 MG PO TABS
1.0000 mg | ORAL_TABLET | Freq: Three times a day (TID) | ORAL | Status: DC
  Administered 2013-10-06 – 2013-10-07 (×3): 1 mg via ORAL
  Filled 2013-10-06 (×3): qty 1

## 2013-10-06 MED ORDER — POTASSIUM CHLORIDE CRYS ER 20 MEQ PO TBCR
40.0000 meq | EXTENDED_RELEASE_TABLET | ORAL | Status: AC
  Administered 2013-10-06 (×2): 40 meq via ORAL
  Filled 2013-10-06 (×2): qty 2

## 2013-10-06 MED ORDER — LORAZEPAM 2 MG/ML IJ SOLN
INTRAMUSCULAR | Status: AC
  Administered 2013-10-06: 2 mg
  Filled 2013-10-06: qty 1

## 2013-10-06 MED ORDER — SODIUM CHLORIDE 0.9 % IV SOLN
1000.0000 mL | INTRAVENOUS | Status: DC
  Administered 2013-10-07: 1000 mL via INTRAVENOUS

## 2013-10-06 MED ORDER — MAGNESIUM SULFATE 40 MG/ML IJ SOLN
2.0000 g | Freq: Once | INTRAMUSCULAR | Status: AC
  Administered 2013-10-06: 2 g via INTRAVENOUS
  Filled 2013-10-06: qty 50

## 2013-10-06 MED ORDER — POTASSIUM CHLORIDE CRYS ER 20 MEQ PO TBCR
40.0000 meq | EXTENDED_RELEASE_TABLET | Freq: Every day | ORAL | Status: DC
  Administered 2013-10-07: 40 meq via ORAL
  Filled 2013-10-06: qty 2

## 2013-10-06 MED ORDER — CEFUROXIME AXETIL 500 MG PO TABS: 500.0000 mg | ORAL_TABLET | Freq: Two times a day (BID) | ORAL | Status: DC

## 2013-10-06 NOTE — Care Management Note (Signed)
    Page 1 of 2   10/06/2013     3:42:38 PM CARE MANAGEMENT NOTE 10/06/2013  Patient:  Lindsey Gomez, Lindsey Gomez   Account Number:  [id number]  Date Initiated:  10/06/2013  Documentation initiated by:  Tomi Bamberger  Subjective/Objective Assessment:   dx toxic metabolic encephalopathy, bile duct stricture, seizure  admit- lives with spouse.     Action/Plan:   pt eval- rec hhpt   Anticipated DC Date:  10/07/2013   Anticipated DC Plan:  [place]  CM consult      [facility] Choice  HOME HEALTH   Choice offered to / List presented to:  C-4 Adult Children   DME arranged  [place]      DME agency  [place] arranged  [place].   Status of service:  Completed, signed off Medicare Important Message given?  YES (If response is "NO", the following Medicare IM given date fields will be blank) Date Medicare IM given:  10/06/2013 Medicare IM given by:  Tomi Bamberger Date Additional Medicare IM given:   Additional Medicare IM given by:    Discharge Disposition:  [place]  Per UR Regulation:  Reviewed for med. necessity/level of care/duration of stay  If discussed at [place] of Stay Meetings, dates discussed:    Comments:  10/06/13 [place], BSN [phone] patient lives with spouse, per physical therapy eval rec hhpt, daughter chose [facility] , referral made to [facility] for hhpt and for rolling walker.  Soc will begin 24-48 hrs post dc. Patient also has a private  care giver at home that helps her.

## 2013-10-06 NOTE — Progress Notes (Signed)
Post discharge, patient had a witnessed grand seizure while sitting on the chair. Chart was reviewed she was taking 6-8 mg as scheduled Ativan at home, discussed with her daughter and she was potentially taking some extra Ativan from her husband as well.: Daughter she has had issues with taking pain medications and benzodiazepines for a while.   Her seizure most likely was due to benzodiazepine withdrawal, her last dose of Ativan here was on the 26th, will get a head CT, seizures have resolved after 2 mg of IV Ativan, monitor closely. If reoccurs we'll consult neuro. Hold discharge today. We'll resume on a lower than home dose scheduled Ativan regimen.

## 2013-10-06 NOTE — Progress Notes (Signed)
EEG Completed; Results Pending  

## 2013-10-06 NOTE — Evaluation (Signed)
Occupational Therapy Evaluation Patient Details Name: Lindsey Gomez MRN: [id number] DOB: 03/11/1946 Today's Date: 10/06/2013    History of Present Illness Patient is a 68 y/o female admitted 7/23 with concerns for 2-3 week history of increasing somnolence, poor appetite, N/V, recently tx for aspiration PNA June 2015 diagnosed with toxic metabolic encephalopathy; s/p ECRP 7/27 secondary to bile duct stricture. PMH positive for chronic pain on opiates, fibromyalgia, DM, COPD, migraines, depression, HTN, Barrett's Esophagus, left upper lobe lesion 07/2010, breast ca s/p right mastectomy 04/2005 and s/p fall 09/30/13.   Clinical Impression   Pt is at set up level with UB ADLs and min guard dA level with LB ADLs and ADL mobility due to decreased balance and generalized weakness. Pt ha personal care attendant at home. No further acute OT services are indicated at this time. Pt to continue with acute PT services to address functional mobility safety and balance.    Follow Up Recommendations  No OT follow up;Supervision/Assistance - 24 hour    Equipment Recommendations  None recommended by OT    Recommendations for Other Services       Precautions / Restrictions Precautions Precautions: Fall Restrictions Weight Bearing Restrictions: No      Mobility Bed Mobility               General bed mobility comments: pt up in recliner upon entering room  Transfers Overall transfer level: Needs assistance Equipment used: Rolling walker (2 wheeled) Transfers: Sit to/from Stand Sit to Stand: Min guard              Balance   Sitting-balance support: No upper extremity supported;Feet supported Sitting balance-Leahy Scale: Fair     Standing balance support: Bilateral upper extremity supported;Single extremity supported;During functional activity Standing balance-Leahy Scale: Poor                              ADL Overall ADL's : Needs assistance/impaired Eating/Feeding:  Independent   Grooming: Wash/dry hands;Wash/dry face;Standing;Min guard Grooming Details (indicate cue type and reason): verbal cues for initiation Upper Body Bathing: Sitting;Set up Upper Body Bathing Details (indicate cue type and reason): verbal cues for initiation Lower Body Bathing: Min guard;Sit to/from stand Lower Body Bathing Details (indicate cue type and reason): verbal cues for initiation Upper Body Dressing : Set up;Sitting Upper Body Dressing Details (indicate cue type and reason): verbal cues for initiation Lower Body Dressing: Min guard;Sit to/from stand Lower Body Dressing Details (indicate cue type and reason): verbal cues for initiation Toilet Transfer: Regular Toilet;BSC;Min guard   Toileting- Water quality scientist and Hygiene: Min guard;Sit to/from stand Toileting - Clothing Manipulation Details (indicate cue type and reason): verbal cues for initiation     Functional mobility during ADLs: Min guard;Rolling walker General ADL Comments: mon guard A due to decreased balance, verbal cues for initiation     Vision  wears glasses at all times                   Perception Perception Perception Tested?: No   Praxis Praxis Praxis tested?: Not tested    Pertinent Vitals/Pain No c/o pain     Hand Dominance Right   Extremity/Trunk Assessment Upper Extremity Assessment Upper Extremity Assessment: Overall WFL for tasks assessed;Generalized weakness   Lower Extremity Assessment Lower Extremity Assessment: Defer to PT evaluation       Communication Communication Communication: No difficulties   Cognition Arousal/Alertness: Awake/alert Behavior During Therapy: [facility] for  tasks assessed/performed Overall Cognitive Status: No family/caregiver present to determine baseline cognitive functioning Area of Impairment: Memory                   General Comments   Pt pleasant and cooperative, low motivation                 Home Living  Family/patient expects to be discharged to:: Private residence Living Arrangements: Spouse/significant other Available Help at Discharge: Personal care attendant;Available PRN/intermittently Type of Home: House Home Access: Level entry     Home Layout: One level     Bathroom Shower/Tub: Occupational psychologist: Standard     Home Equipment: Environmental consultant - 2 wheels;Bedside commode;Shower seat;Grab bars - tub/shower;Hand held shower head;Cane - single point   Additional Comments: RW belongs to husband.      Prior Functioning/Environment Level of Independence: Needs assistance  Gait / Transfers Assistance Needed: Pt ambulating (I) PTA however history of falls.  ADL's / Homemaking Assistance Needed: Reports (I) with ADLs, assist with IADLs from care giver.        OT Diagnosis: Generalized weakness   OT Problem List:     OT Treatment/Interventions:      OT Goals(Current goals can be found in the care plan section) Acute Rehab OT Goals Patient Stated Goal: to go home  OT Frequency:     Barriers to D/C:    none                     End of Session Equipment Utilized During Treatment: Rolling walker;Other (comment);Gait belt (BSC)  Activity Tolerance: Patient tolerated treatment well Patient left: in bed;with chair alarm set   Time: 9509-3267 OT Time Calculation (min): 23 min Charges:  OT General Charges $OT Visit: 1 Procedure OT Evaluation $Initial OT Evaluation Tier I: 1 Procedure OT Treatments $Therapeutic Activity: 8-22 mins G-Codes:    Britt Bottom 10/06/2013, 1:38 PM

## 2013-10-06 NOTE — Patient Care Conference (Signed)
See previous notes for events from this AM. Briefly, I was called into room emergently for pt being unresponsive. Pt noted to be actively seizing. Pt turned to side to protect airway and 2mg  IV ativan ordered and given. Seizures ended in less than 5 min. Pt slowly regained consciousness. At this time, pt's primary MD entered room and continued treatment and work up.

## 2013-10-06 NOTE — Evaluation (Signed)
Physical Therapy Evaluation Patient Details Name: Lindsey Gomez MRN: [id number] DOB: 1946-01-05 Today's Date: 10/06/2013   History of Present Illness  Patient is a 68 y/o female admitted 7/23 with concerns for 2-3 week history of increasing somnolence, poor appetite, N/V, recently tx for aspiration PNA June 2015 diagnosed with toxic metabolic encephalopathy; s/p ECRP 7/27 secondary to bile duct stricture. PMH positive for chronic pain on opiates, fibromyalgia, DM, COPD, migraines, depression, HTN, Barrett's Esophagus, left upper lobe lesion 07/2010, breast ca s/p right mastectomy 04/2005 and s/p fall 09/30/13.   Clinical Impression  Patient presents with functional limitations due to deficits listed in PT problem list below. Pt presents with balance deficits and generalized weakness limiting safe mobility. Pt would benefit from having 24/7 care at home for safety. Daughter made aware of need for increased care per telephone conversation. Pt would benefit from skilled PT to maximize independence and improve safe mobility so pt can safely return to PLOF.    Follow Up Recommendations Home health PT;Supervision for mobility/OOB    Equipment Recommendations  Rolling walker with 5" wheels    Recommendations for Other Services       Precautions / Restrictions Precautions Precautions: Fall Restrictions Weight Bearing Restrictions: No      Mobility  Bed Mobility Overal bed mobility: Needs Assistance Bed Mobility: Supine to Sit     Supine to sit: Supervision;HOB elevated     General bed mobility comments: Use of rail for support.  Transfers Overall transfer level: Needs assistance Equipment used: Rolling walker (2 wheeled) Transfers: Sit to/from [facility] Sit to Stand: Min guard Stand pivot transfers: Min guard       General transfer comment: Stood from EOB x2, SPT to recliner with RW. Ambulated short distance within room furniture  walking.  Ambulation/Gait Ambulation/Gait assistance: Min guard Ambulation Distance (Feet): 200 Feet Assistive device: Rolling walker (2 wheeled) Gait Pattern/deviations: Step-through pattern;Decreased stride length   Gait velocity interpretation: Below normal speed for age/gender General Gait Details: Unsteadiness noted during gait training. VC for RW management/safety.  Stairs            Wheelchair Mobility    Modified Rankin (Stroke Patients Only)       Balance Overall balance assessment: Needs assistance   Sitting balance-Leahy Scale: Fair     Standing balance support: Bilateral upper extremity supported;During functional activity Standing balance-Leahy Scale: Poor Standing balance comment: Use of RW vs furniture for support due to balance deficits.                             Pertinent Vitals/Pain No pain reported. SOB noted during gait training however vitals stable.     Home Living Family/patient expects to be discharged to:: Private residence Living Arrangements: Spouse/significant other Available Help at Discharge: Personal care attendant;Available PRN/intermittently (Daughter working on getting more help at home prior to discharge.,) Type of Home: House Home Access: Level entry     Home Layout: One level Home Equipment: [place] - 2 wheels;Bedside commode;Shower seat;Grab bars - tub/shower;Hand held shower head;Cane - single point Additional Comments: RW belongs to husband.    Prior Function Level of Independence: Needs assistance   Gait / Transfers Assistance Needed: Pt ambulating (I) PTA however history of falls.   ADL's / Homemaking Assistance Needed: Reports (I) with ADLs, assist with IADLs from care giver.        Hand Dominance        Extremity/Trunk  Assessment   Upper Extremity Assessment: Overall WFL for tasks assessed           Lower Extremity Assessment: Generalized weakness         Communication    Communication: No difficulties  Cognition Arousal/Alertness: Awake/alert Behavior During Therapy: WFL for tasks assessed/performed Overall Cognitive Status:  (Pt questionable historian. Reports speaking on phone with husband however phone given to therapist and it is her daughter. )                      General Comments General comments (skin integrity, edema, etc.): Pt perseverating on having pill stuck in throat in beginning of session. Per Nsg, pt has been stating this all morning, however able to have conversations and mobilize without difficulty. Not mentioned during evaluation.    Exercises General Exercises - Lower Extremity Ankle Circles/Pumps: AROM;Both;10 reps;Seated Long Arc Quad: Both;10 reps;Seated Hip Flexion/Marching: Both;10 reps;Seated      Assessment/Plan    PT Assessment Patient needs continued PT services  PT Diagnosis Generalized weakness;Difficulty walking   PT Problem List Decreased strength;Cardiopulmonary status limiting activity;Decreased cognition;Decreased activity tolerance;Decreased knowledge of use of DME;Decreased balance;Decreased safety awareness;Decreased mobility;Decreased knowledge of precautions  PT Treatment Interventions DME instruction;Balance training;Gait training;Cognitive remediation;Functional mobility training;Patient/family education;Therapeutic activities;Therapeutic exercise   PT Goals (Current goals can be found in the Care Plan section) Acute Rehab PT Goals Patient Stated Goal: to go home PT Goal Formulation: With patient Time For Goal Achievement: 10/13/13 Potential to Achieve Goals: Good    Frequency Min 3X/week   Barriers to discharge Decreased caregiver support Daughter working on extending caregiver hours.    Co-evaluation               End of Session Equipment Utilized During Treatment: Gait belt Activity Tolerance: Patient tolerated treatment well Patient left: in chair;with call bell/phone within  reach;with chair alarm set Nurse Communication: Mobility status;Precautions         Time: 9233-0076 PT Time Calculation (min): 25 min   Charges:   PT Evaluation $Initial PT Evaluation Tier I: 1 Procedure PT Treatments $Gait Training: 8-22 mins   PT G CodesCandy Sledge A 10/06/2013, 9:17 AM Candy Sledge, Oretta, DPT [phone]

## 2013-10-06 NOTE — Discharge Summary (Addendum)
Lindsey Gomez, is a 68 y.o. female  DOB November 22, 1945  MRN [id number].  Admission date:  10/01/2013  Admitting Physician  Nita Sells, MD  Discharge Date:  10/07/2013   Primary MD  Walker Kehr, MD  Recommendations for primary care physician for things to follow:   Recommend close followup with gastroenterologist-consideration to be given for endoscopic ultrasound soon. They will contact the patient hopefully with result of brushings for cytology on ERCP.  Outpatient followup with neurology for seizure which was like a benzodiazepine withdrawal induced.   Gradually diet as tolerated-if not able to tolerate, will continue boost supplements   Consider outpatient mammogram according to screening guidelines   Complete 2 days more of Macrodantin for her Citrobacter Koseri pyelonephritis stop date 7/29   Recommend checking CBC, CMP + INR in about 3 days  Recommend simplification of polypharmacy specifically multiple pain medications Consider discontinuation of Brintellix which has a 20-30% risk of nausea vomiting listed under adverse effects    Admission Diagnosis  Toxic metabolic encephalopathy  Bile duct stricture   Discharge Diagnosis  Toxic metabolic encephalopathy  Bile duct stricture     Principal Problem:   Toxic metabolic encephalopathy Active Problems:   ANXIETY   TOBACCO USE DISORDER/SMOKER-SMOKING CESSATION DISCUSSED   MIGRAINE VARIANTS, W/INTRACTABLE MIGRAINE   COPD   FIBROMYALGIA   TRAUMAT HEMOTHOR W/O MENTION OPEN WOUND IN THOR   Barrett's esophagus   Opioid dependence   Protein-calorie malnutrition, severe   Dilated bile duct   Common bile duct (CBD) stricture   Nonspecific elevation of levels of transaminase or lactic acid dehydrogenase (LDH)      Past Medical History  Diagnosis Date  .  Depression   . Type II or unspecified type diabetes mellitus without mention of complication, not stated as uncontrolled   . HTN (hypertension)   . Chronic pain   . Fibromyalgia   . Migraines   . History of breast cancer     Right  . Chronic pancreatitis   . Vitamin D deficiency   . GERD (gastroesophageal reflux disease)   . Finger dislocation 2009    L 3rd  . COPD (chronic obstructive pulmonary disease)   . Anemia   . Fractured sternum 11/2008  . Osteoporosis     Reclast too expensive  . Barrett's esophagus   . Chronic fatigue   . Diverticulosis   . Opioid dependence   . Cancer     HX OF BREAST CANCER     Past Surgical History  Procedure Laterality Date  . Nasal sinus surgery    . Pancreatectomy      40%  . Cholecystectomy    . Appendectomy    . Abdominal hysterectomy    . Lobectomy Left 2005    granulomatous lesion.   . Breast lumpectomy Left   . Ercp N/A 10/05/2013    Procedure: ENDOSCOPIC RETROGRADE CHOLANGIOPANCREATOGRAPHY (ERCP);  Surgeon: Ladene Artist, MD;  Location: [facility] ENDOSCOPY;  Service: Endoscopy;  Laterality: N/A;  History of present illness and  Hospital Course:     Kindly see H&P for history of present illness and admission details, please review complete Labs, Consult reports and Test reports for all details in brief  HPI  from the history and physical done on the day of admission   68 y/o ?, known well documented chronic pain on opiates, fibromyalgia ty 1 DM, Prior pancreatitis c h/o Pancreatectomy, migraine, COPD,Gerd+ prior history Barrett's esophagus 09/05/2010 follwed by Dr. Deatra Ina, prior h/o Breast CA? 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 Metabolic Encephalopathy-ammonia noT elevated. Secondary to chronic pain medications +mild acute kidney injury. Improving . See below, now at baseline eager to go home and not up for placement. She was counseled not to overuse narcotics and sedatives. 2. Hypokalemia-likely due to diarrhea, Replaced Potassium and Magnesium, request PCP to check CBC and BMP in 3 days, K currently normal. 3. Citrobacter pyelonephritis-no fever Cover with ceftriaxone 1 g every 24 for now-transitioned on discharge to Ceftin .  4. Chronic pain-patient in no significant pain currently and seems comfortable however asking for chronic meds. Patient has been seen chronically for this issue and I have discussed with family that discussion regarding chronic long-term medications should be carried out with primary care physician. I will not change her discharge pain regimen nor anxiety regimen 5. Cough-unclear etiology-? Bronchitis from chronic smoking-chest x-ray 7/23 resolving pneumonia this has resolved. 6. Posttussive nausea-component of her antidepressant Brintellix which was cut back from 20-10 mg on admission. We may need to discontinued this as an outpatient.she will go home on 10 mg dose  7. Hepatic derangement DDX-primary biliary cirrhosis vs. primary sclerosing cholangitis ?-ultrasound abdomen pelvis 7/24 suggestive of biliary duct dilatation 15.9 millimeters. MRCP 7/24 =? Biliary stricture. -ERCP performed 10/05/13-- as an outpatient brushings need to be followed up. To have close followup with gastroenterology Dr. Ardis Hughs for consideration EUS  8. Fibromyalgia-see above  discussion 9. Anxiety see above discussion  10. History breast cancer on tamoxifen-monitor. LFTs likely represent frank obstructive assess secondary to elevated alk phosphatase-tamoxifen is not likely to be of concern in this case Need to recheck mammogram as an outpatient 11. Hypercalcemia-was on vitamin D which makes specific diagnosis challenging. Corrected calcium =9.5. No further workup, monitor ionized calcium levels closely. 12. HTN continue atenolol 50 daily 13. 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 Opiate-induced Constipation-will add sorbitol 30 mils twice a day until diarrhea and needs of her bowel regime as an outpatient  15. Adrenal insufficiency and continue -continue Cortef 10 mg twice a day 16. C. difficile negative diarrhea caused by antibiotics, placed on Imodium when necessary. 17. 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 Requested her to follow with PCP and neurology one time outpatient.      Discharge Condition: Stable    Follow UP  Follow-up Information   Follow up with Walker Kehr, MD. Schedule an appointment as soon as possible for a visit in 3 days.   Specialty:  Internal Medicine   Contact information:   [place] [address] [phone]       Follow up with Milus Banister, MD. Schedule an appointment as soon as possible for a visit in 1 week.   Specialty:  Gastroenterology   Contact information:   [address] [place] [address] [phone]       Follow up with [facility]. Schedule an appointment as soon as possible for a visit in 1 week.   Contact information:   [address] [place] [address] [phone]         Discharge Instructions  and  Discharge Medications          Discharge Instructions   Diet - low sodium heart healthy    Complete by:  As directed      Discharge instructions    Complete by:  As directed   Follow with Primary MD Walker Kehr, MD in 3 days   Get CBC, CMP, 2 view Chest X ray checked  by Primary MD next visit.    Activity: As tolerated with Full fall precautions use walker/cane & assistance as needed   Disposition Home     Diet: Heart Healthy with feeding assistance and aspiration precautions if needed.  For Heart failure patients - Check your Weight same time everyday, if you gain over 2 pounds, or you develop in leg swelling, experience more shortness of breath or chest pain, call your Primary MD immediately. Follow Cardiac Low Salt Diet and 1.8 lit/day fluid restriction.   On your next visit with her primary care physician please Get Medicines reviewed and adjusted.  Please request your Prim.MD to go over all Hospital Tests and Procedure/Radiological results at the follow up, please get all Hospital records sent to your Prim MD by signing hospital release before you go home.   If you experience worsening of your admission symptoms, develop shortness of breath, life threatening emergency, suicidal or homicidal thoughts you must seek medical attention immediately by calling 911 or calling your MD immediately  if symptoms less severe.  You Must read complete instructions/literature along with all the possible adverse reactions/side effects for all the Medicines you take and that have been prescribed to you. Take any new Medicines after you have completely understood and accpet all the possible adverse  reactions/side effects.   Do not drive, operating heavy machinery, perform activities at heights, swimming or participation in water activities or provide baby sitting services until you have seen by Primary MD or a Neurologist and advised to do so again.  Do not drive when taking Pain medications.    Do not take more than prescribed Pain, Sleep and Anxiety Medications  Special Instructions: If you have smoked or chewed Tobacco  in the last 2 yrs please stop smoking, stop any regular Alcohol  and or any Recreational drug use.  Wear Seat belts while driving.   Please note  You were cared for by a hospitalist during your hospital stay. If you have any questions about your discharge medications or the care you received while you were in the hospital after you are discharged, you can call the unit and asked to speak with the hospitalist on call if the hospitalist that took care of you is not available. Once you are discharged, your primary care physician will handle any further medical issues. Please note that NO REFILLS for any discharge medications will be authorized once you are discharged, as it is imperative that you return to your primary care physician (or establish a relationship with a primary care physician if you do not have one) for your aftercare needs so that they can reassess your need for medications and monitor your lab values.  Follow with Primary MD Walker Kehr, MD in 3 days   Get CBC, CMP, 2 view Chest X ray checked  by Primary MD next visit.    Activity: As tolerated with Full fall precautions use walker/cane & assistance as needed   Disposition Home     Diet: Heart Healthy with feeding assistance and aspiration precautions if needed.  For Heart failure patients - Check your Weight same time everyday, if you gain over 2 pounds, or you develop in leg swelling, experience more shortness of breath or chest pain, call your Primary MD immediately. Follow Cardiac Low Salt  Diet and 1.8 lit/day fluid restriction.   On your next visit with her primary care physician please Get Medicines reviewed and adjusted.  Please request your Prim.MD to go over all Hospital Tests and Procedure/Radiological results at the follow up, please get all Hospital records sent to your Prim MD by signing hospital release before you go home.   If you experience worsening of your admission symptoms, develop shortness of breath, life threatening emergency, suicidal or homicidal thoughts you must seek medical attention immediately by calling 911 or calling your MD immediately  if symptoms less severe.  You Must read complete instructions/literature along with all the possible adverse reactions/side effects for all the Medicines you take and that have been prescribed to you. Take any new Medicines after you have completely understood and accpet all the possible adverse reactions/side effects.   Do not drive, operating heavy machinery, perform activities at heights, swimming or participation in water activities or provide baby sitting services until you have seen by Primary MD or a Neurologist and advised to do so again.  Do not drive when taking Pain medications.    Do not take more than prescribed Pain, Sleep and Anxiety Medications  Special Instructions: If you have smoked or chewed Tobacco  in the last 2 yrs please stop smoking, stop any regular Alcohol  and or any Recreational drug use.  Wear Seat belts while driving.   Please note  You were cared for by a hospitalist during your hospital stay. If you have any questions about your discharge medications or the care you received while you were in the hospital after you are discharged, you can call the unit and asked to speak with the hospitalist on call if the hospitalist that took care of you is not available. Once you are discharged, your primary care physician will handle any further medical issues. Please note that NO REFILLS for  any discharge medications will be authorized once you are discharged, as it is imperative that you return to your primary care physician (or establish  a relationship with a primary care physician if you do not have one) for your aftercare needs so that they can reassess your need for medications and monitor your lab values.     Increase activity slowly    Complete by:  As directed             Medication List         amitriptyline 50 MG tablet  Commonly known as:  ELAVIL  Take 25-50 mg by mouth at bedtime.     atenolol 100 MG tablet  Commonly known as:  TENORMIN  Take 1 tablet (100 mg total) by mouth every morning.     atorvastatin 10 MG tablet  Commonly known as:  LIPITOR  Take 1 tablet (10 mg total) by mouth daily.     calcitRIOL 0.5 MCG capsule  Commonly known as:  ROCALTROL  Take 2 capsules (1 mcg total) by mouth daily.     cefUROXime 500 MG tablet  Commonly known as:  CEFTIN  Take 1 tablet (500 mg total) by mouth 2 (two) times daily with a meal.     docusate sodium 100 MG capsule  Commonly known as:  COLACE  Take 100 mg by mouth daily as needed for mild constipation.     eletriptan 40 MG tablet  Commonly known as:  RELPAX  Take 40 mg by mouth as needed for migraine or headache. One tablet by mouth at onset of headache. May repeat in 2 hours if headache persists or recurs.     GLUCAGON EMERGENCY 1 MG injection  Generic drug:  glucagon  Inject 1 mg into the vein once as needed (for glucose).     hydrocortisone 10 MG tablet  Commonly known as:  CORTEF  Take 1 tablet (10 mg total) by mouth 2 (two) times daily.     insulin aspart 100 UNIT/ML FlexPen  Commonly known as:  NOVOLOG  Inject 4-10 Units into the skin 3 (three) times daily with meals. Inject 10 units subq with breakfast, inject 4 units subq with lunch and inject 5 units with evening meal.     insulin detemir 100 UNIT/ML injection  Commonly known as:  LEVEMIR  Inject 8 Units into the skin at bedtime.      loperamide 2 MG tablet  Commonly known as:  IMODIUM A-D  Take 1 tablet (2 mg total) by mouth 4 (four) times daily as needed for diarrhea or loose stools.     LORazepam 1 MG tablet  Commonly known as:  ATIVAN  Take 1 tablet (1 mg total) by mouth 3 (three) times daily.     mometasone 50 MCG/ACT nasal spray  Commonly known as:  NASONEX  Place 2 sprays into both nostrils daily.     omeprazole 40 MG capsule  Commonly known as:  PRILOSEC  Take 1 capsule (40 mg total) by mouth 2 (two) times daily.     OxyCODONE 10 mg T12a 12 hr tablet  Commonly known as:  OXYCONTIN  Take 1 tablet (10 mg total) by mouth 3 (three) times daily.     prochlorperazine 10 MG tablet  Commonly known as:  COMPAZINE  Take 10 mg by mouth every 8 (eight) hours as needed for nausea or vomiting.     tamoxifen 20 MG tablet  Commonly known as:  NOLVADEX  Take 20 mg by mouth 2 (two) times daily.     topiramate 100 MG tablet  Commonly known as:  TOPAMAX  Take 0.5 tablets (50  mg total) by mouth 2 (two) times daily.     triamcinolone ointment 0.1 %  Commonly known as:  KENALOG  Apply 1 application topically 2 (two) times daily as needed (itching).     Vortioxetine HBr 20 MG Tabs  Commonly known as:  BRINTELLIX  Take 10 mg by mouth daily.     zolmitriptan 5 MG tablet  Commonly known as:  ZOMIG  Take 5 mg by mouth daily as needed for migraine.          Diet and Activity recommendation: See Discharge Instructions above   Consults obtained - GI   Major procedures and Radiology Reports - PLEASE review detailed and final reports for all details, in brief -    EEG  IMPRESSION:  This is a normal drowsy and asleep electroencephalogram. No epileptiform activity is noted.    ENDOSCOPIC IMPRESSION:  1. Mildly prominent and mildly edematous major ampulla  2. Marked, diffuse dilation of the common bile duct and intrahepatic ducts  3. Short stricture in the distal common bile duct; brushings for cytology  obtained, under endoscopic and fluoroscopic guidance a 10 fr x 5 cm biliary sent placement was performed    Dg Chest 2 View  10/01/2013   CLINICAL DATA:  Fatigue.  Weakness.  EXAM: CHEST  2 VIEW  COMPARISON:  08/14/2013.  08/22/2012.  FINDINGS: Improved RIGHT middle lobe airspace disease compatible with treated pneumonia. Chronic pulmonary parenchymal scarring and postsurgical changes. Cardiopericardial silhouette appears within normal limits. Chronic volume loss in the LEFT lung and elevation of the LEFT hemidiaphragm.  IMPRESSION: Improved RIGHT middle lobe airspace disease consistent with treated pneumonia. Lower lung volumes than on prior. Chronic pulmonary parenchymal scarring.   Electronically Signed   By: Dereck Ligas M.D.   On: 10/01/2013 14:43   Ct Head Wo Contrast  10/01/2013   CLINICAL DATA:  Fatigue and weakness  EXAM: CT HEAD WITHOUT CONTRAST  TECHNIQUE: Contiguous axial images were obtained from the base of the skull through the vertex without intravenous contrast.  COMPARISON:  CT head 05/06/2013  FINDINGS: Generalized atrophy with mild chronic microvascular ischemia  Negative for acute infarct.  Negative for hemorrhage or mass.  Extensive sinus surgery on the left. Extensive bony thickening of the left maxillary sinus.  IMPRESSION: Atrophy and mild chronic microvascular ischemia. No acute abnormality.   Electronically Signed   By: Franchot Gallo M.D.   On: 10/01/2013 15:18   US Abdomen Complete  10/02/2013   CLINICAL DATA:  Abdominal pain.  History of a cholecystectomy.  EXAM: ULTRASOUND ABDOMEN COMPLETE  COMPARISON:  CT, 10/13/2003  FINDINGS: Gallbladder:  Surgically removed  Common bile duct:  Diameter: 15.9 mm. No duct stone is seen. Most distal aspect of the duct was not visualized.  Liver:  Intrahepatic bile duct dilation. Liver normal in size in echogenicity. No liver mass. Normal hepatopetal flow in the portal vein.  IVC:  No abnormality visualized.  Pancreas:  Not well  visualized. Much of the lower pancreatic head was obscured by bowel gas.  Spleen:  Size and appearance within normal limits.  Right Kidney:  Length: 11.6 cm. Normal parenchymal echogenicity. Diffuse cortical thinning. Small cyst with some internal echoes in the lower pole measuring 1 cm. No other renal masses. No hydronephrosis.  Left Kidney:  Length: 11.5 cm. There is cortical thinning. Normal parenchymal echogenicity. 10 mm exophytic cysts from the lateral margin of the mid to lower pole with a thin septation. 11 mm simple appearing upper pole cyst.  No other renal masses. No hydronephrosis.  Abdominal aorta:  No aneurysm visualized.  Other findings:  None.  IMPRESSION: 1. There is significant intern extrahepatic bile duct dilation of unclear etiology. The duct measures 15.9 mm in diameter, which is a significant increase in caliber from the prior CT. The most distal aspect of the duct, and much of the pancreatic head, was not visualized due to overlying bowel gas. A distal duct stone or a pancreatic mass is not excluded. Recommend followup ERCP or MRI/MRCP. If MRI is chosen, recommend pancreatic protocol MRI with contrast with MRCP. 2. Status post cholecystectomy. 3. Small renal cysts and bilateral renal cortical thinning. 4. No other abnormalities.   Electronically Signed   By: Lajean Manes M.D.   On: 10/02/2013 11:52   Mr Abdomen Mrcp Wo Cm  10/02/2013   CLINICAL DATA:  Dilated bile ducts noted on recent ultrasound examination.  EXAM: MRI ABDOMEN WITHOUT  (INCLUDING MRCP)  TECHNIQUE: Multiplanar multisequence MR imaging of the abdomen was performed. Heavily T2-weighted images of the biliary and pancreatic ducts were obtained, and three-dimensional MRCP images were rendered by post processing.  COMPARISON:  Abdominal ultrasound 10/02/2013.  FINDINGS: Severe intra and extrahepatic biliary ductal dilatation is noted, with both the common bile duct and the common hepatic duct measuring up to 18 mm in diameter.  The cystic duct remnant is also dilated. Patient is status post cholecystectomy. There are no filling defects within the bile ducts to suggest the presence of retained ductal stones. The distal common bile duct abruptly tapers immediately before the level of the ampulla. The pancreatic duct is not dilated. The region of the pancreatic head is poorly evaluated on today's non contrast CT examination. No large pancreatic head mass is identified. A small ampullary lesion causing obstruction cannot be excluded on the basis of today's examination. No focal hepatic lesions are noted on today's noncontrast study.  The unenhanced appearance of the pancreas, spleen and bilateral adrenal glands is unremarkable. There are numerous renal lesions bilaterally, which are predominantly low signal intensity on T1 weighted images and high signal intensity on T2 weighted images, most likely to represent cysts (incompletely characterized on today's noncontrast CT examination). In addition, several renal lesions demonstrate some dependent high T1 signal intensity, compatible with proteinaceous or hemorrhagic contents.  IMPRESSION: 1. Severe intra and extrahepatic biliary ductal dilatation redemonstrated. This abruptly terminates in the distal common bile duct immediately before the level of the ampulla. 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 The possibility of a tiny obstructing ampullary mass is not excluded on today's noncontrast examination. No large obstructing pancreatic head mass is noted. Correlation with ERCP may provide additional diagnostic information if clinically appropriate. 2. Additional incidental findings, as above.   Electronically Signed   By: Vinnie Langton M.D.   On: 10/02/2013 20:19   Mr 3d Recon At Scanner  10/02/2013   CLINICAL DATA:  Dilated bile ducts noted on recent ultrasound examination.  EXAM: MRI ABDOMEN WITHOUT   (INCLUDING MRCP)  TECHNIQUE: Multiplanar multisequence MR imaging of the abdomen was performed. Heavily T2-weighted images of the biliary and pancreatic ducts were obtained, and three-dimensional MRCP images were rendered by post processing.  COMPARISON:  Abdominal ultrasound 10/02/2013.  FINDINGS: Severe intra and extrahepatic biliary ductal dilatation is noted, with both the common bile duct and the common hepatic duct measuring up to 18 mm in diameter. The cystic duct remnant is also dilated. Patient  is status post cholecystectomy. There are no filling defects within the bile ducts to suggest the presence of retained ductal stones. The distal common bile duct abruptly tapers immediately before the level of the ampulla. The pancreatic duct is not dilated. The region of the pancreatic head is poorly evaluated on today's non contrast CT examination. No large pancreatic head mass is identified. A small ampullary lesion causing obstruction cannot be excluded on the basis of today's examination. No focal hepatic lesions are noted on today's noncontrast study.  The unenhanced appearance of the pancreas, spleen and bilateral adrenal glands is unremarkable. There are numerous renal lesions bilaterally, which are predominantly low signal intensity on T1 weighted images and high signal intensity on T2 weighted images, most likely to represent cysts (incompletely characterized on today's noncontrast CT examination). In addition, several renal lesions demonstrate some dependent high T1 signal intensity, compatible with proteinaceous or hemorrhagic contents.  IMPRESSION: 1. Severe intra and extrahepatic biliary ductal dilatation redemonstrated. This abruptly terminates in the distal common bile duct immediately before the level of the ampulla. 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 The possibility of a tiny obstructing ampullary mass is not  excluded on today's noncontrast examination. No large obstructing pancreatic head mass is noted. Correlation with ERCP may provide additional diagnostic information if clinically appropriate. 2. Additional incidental findings, as above.   Electronically Signed   By: Vinnie Langton M.D.   On: 10/02/2013 20:19   Dg Ercp  10/05/2013   CLINICAL DATA:  Biliary drug stricture  EXAM: ERCP  TECHNIQUE: Multiple spot images obtained with the fluoroscopic device and submitted for interpretation post-procedure.  COMPARISON:  None.  FINDINGS: The series of images demonstrate severe dilatation of the mid and upper common bile duct and marked narrowing of the distal common bile duct. The final image demonstrates a stent placed across the distal common bile duct.  IMPRESSION: Biliary stent placement.  These images were submitted for radiologic interpretation only. Please see the procedural report for the amount of contrast and the fluoroscopy time utilized.   Electronically Signed   By: Maryclare Bean M.D.   On: 10/05/2013 15:02   Dg Abd 2 Views  10/01/2013   CLINICAL DATA:  History of pancreatectomy, fatigue  EXAM: ABDOMEN - 2 VIEW  COMPARISON:  None.  FINDINGS: Moderate amount of stool throughout the colon. Surgical clips are scattered primarily in the left upper quadrant. There is no bowel dilatation to suggest obstruction. There is no evidence of pneumoperitoneum, portal venous gas or pneumatosis. There are no pathologic calcifications along the expected course of the ureters.  Mild osteoarthritis of bilateral hips.  IMPRESSION: No bowel obstruction.  Moderate amount of stool within the colon.   Electronically Signed   By: Kathreen Devoid   On: 10/01/2013 16:13    Micro Results      Recent Results (from the past 240 hour(s))  URINE CULTURE     Status: None   Collection Time    10/02/13 10:28 AM      Result Value Ref Range Status   Specimen Description URINE, CLEAN CATCH   Final   Special Requests NONE   Final    Culture  Setup Time     Final   Value: 10/02/2013 10:55     Performed at [facility]     Final   Value: >=100,000 COLONIES/ML     Performed at  [facility]     Final   Value: CITROBACTER KOSERI     Performed at [facility]   Report Status 10/04/2013 FINAL   Final   Organism ID, Bacteria CITROBACTER KOSERI   Final  CLOSTRIDIUM DIFFICILE BY PCR     Status: None   Collection Time    10/05/13 11:15 PM      Result Value Ref Range Status   C difficile by pcr NEGATIVE  NEGATIVE Final       Today   Subjective:   Lorie Cleckley today has no headache,no chest abdominal pain,no new weakness tingling or numbness, feels much better wants to go home today.    Objective:   Blood pressure 132/80, pulse 63, temperature 98.4 F (36.9 C), temperature source Oral, resp. rate 20, height 5' 1"  (1.549 m), weight 43.455 kg (95 lb 12.8 oz), SpO2 100.00%.   Intake/Output Summary (Last 24 hours) at 10/07/13 0913 Last data filed at 10/07/13 0106  Gross per 24 hour  Intake    880 ml  Output      0 ml  Net    880 ml    Exam Awake Alert, Oriented x 3, No new F.N deficits, Normal affect [place].AT,PERRAL Supple Neck,No JVD, No cervical lymphadenopathy appriciated.  Symmetrical Chest wall movement, Good air movement bilaterally, CTAB RRR,No Gallops,Rubs or new Murmurs, No Parasternal Heave +ve B.Sounds, Abd Soft, Non tender, No organomegaly appriciated, No rebound -guarding or rigidity. No Cyanosis, Clubbing or edema, No new Rash or bruise  Data Review   CBC w Diff: Lab Results  Component Value Date   WBC 4.4 10/03/2013   WBC 8.7 09/06/2011   WBC 6.8 11/07/2010   HGB 9.3* 10/03/2013   HGB 10.6* 09/06/2011   HGB 10.7* 11/07/2010   HCT 27.7* 10/03/2013   HCT 35.4* 09/06/2011   HCT 32.5* 11/07/2010   PLT 118* 10/03/2013   PLT 190 11/07/2010   LYMPHOPCT 42 10/03/2013   LYMPHOPCT 28.1 11/07/2010   MONOPCT 7 10/03/2013   MONOPCT 5.2 11/07/2010   EOSPCT 2  10/03/2013   EOSPCT 1.3 11/07/2010   BASOPCT 1 10/03/2013   BASOPCT 0.3 11/07/2010    CMP: Lab Results  Component Value Date   NA 142 10/06/2013   K 4.3 10/07/2013   CL 112 10/06/2013   CO2 18* 10/06/2013   BUN 13 10/06/2013   CREATININE 1.01 10/06/2013   PROT 5.9* 10/05/2013   ALBUMIN 2.9* 10/05/2013   BILITOT <0.2* 10/05/2013   ALKPHOS 149* 10/05/2013   AST 22 10/05/2013   ALT 48* 10/05/2013  .   Total Time in preparing paper work, data evaluation and todays exam - 35 minutes  Thurnell Lose M.D on 10/07/2013 at 9:13 AM  [facility] Office  [phone]   **Disclaimer: This note may have been dictated with voice recognition software. Similar sounding words can inadvertently be transcribed and this note may contain transcription errors which may not have been corrected upon publication of note.**

## 2013-10-06 NOTE — Progress Notes (Signed)
          Daily Rounding Note  10/06/2013, 10:18 AM  LOS: 5 days   SUBJECTIVE:       Eating clears no c/o nausea or abd pain.   OBJECTIVE:         Vital signs in last 24 hours:    Temp:  [97.7 F (36.5 C)-98.7 F (37.1 C)] 98.4 F (36.9 C) (07/28 0359) Pulse Rate:  [59-87] 70 (07/28 0621) Resp:  [11-18] 16 (07/28 0359) BP: (146-208)/(75-134) 158/78 mmHg (07/28 0621) SpO2:  [98 %-100 %] 100 % (07/28 0359) Weight:  [43.137 kg (95 lb 1.6 oz)] 43.137 kg (95 lb 1.6 oz) (07/28 0359) Last BM Date:  (unknown, pt is not sure ) General: frail.  Looks unwell   Heart: RRR Chest: clear bil Abdomen: NT, ND.  No mass, No HSM.  Active BS  Extremities: no CCE.  Thin  Neuro/Psych:  Pleasant, slow but follows commands.   Intake/Output from previous day: 07/27 0701 - 07/28 0700 In: 670 [P.O.:270; I.V.:400] Out: -   Intake/Output this shift:    Lab Results: No results found for this basename: WBC, HGB, HCT, PLT,  in the last 72 hours BMET  Recent Labs  10/04/13 0900 10/05/13 0620 10/06/13  NA 144 146 142  K 3.5* 4.0 3.2*  CL 111 114* 112  CO2 20 20 18*  GLUCOSE 111* 113* 211*  BUN 17 13 13   CREATININE 1.21* 1.05 1.01  CALCIUM 8.5 8.0* 7.5*   LFT  Recent Labs  10/04/13 0900 10/05/13 0620  PROT 5.8* 5.9*  ALBUMIN 2.8* 2.9*  AST 27 22  ALT 56* 48*  ALKPHOS 163* 149*  BILITOT <0.2* <0.2*   PT/INR No results found for this basename: LABPROT, INR,  in the last 72 hours Hepatitis Panel No results found for this basename: HEPBSAG, HCVAB, HEPAIGM, HEPBIGM,  in the last 72 hours  Studies/Results: Dg Ercp  10/05/2013   CLINICAL DATA:  Biliary drug stricture  EXAM: ERCP  TECHNIQUE: Multiple spot images obtained with the fluoroscopic device and submitted for interpretation post-procedure.  COMPARISON:  None.  FINDINGS: The series of images demonstrate severe dilatation of the mid and upper common bile duct and marked narrowing  of the distal common bile duct. The final image demonstrates a stent placed across the distal common bile duct.  IMPRESSION: Biliary stent placement.  These images were submitted for radiologic interpretation only. Please see the procedural report for the amount of contrast and the fluoroscopy time utilized.   Electronically Signed   By: Maryclare Bean M.D.   On: 10/05/2013 15:02    ASSESMENT:   *    ERCP 7/27.  Prominent, edematous ampulla.  CBD and intrahepatic ducts dilated.  Distal CBD stricture treated with stent.  Cytology from brushings sent.    PLAN   Has GI ROV set for 8/28 @ 0930 with Dr. Erskine Emery.  Check CA 19-9 Soft diet (no teeth and hx aspiration)   Lindsey Gomez  10/06/2013, 10:18 AM Pager: [phone]     Attending physician's note   I have taken an interval history, reviewed the chart and examined the patient. I agree with the Advanced Practitioner's note, impression and recommendations. Advance diet as tolerated. Awaiting cytology. OK for discharge from GI standpoint. Outpatient GI follow up with Dr. Doretha Sou as outlined above.   Pricilla Riffle. Fuller Plan, MD [facility]

## 2013-10-07 ENCOUNTER — Telehealth: Payer: Self-pay | Admitting: Internal Medicine

## 2013-10-07 ENCOUNTER — Encounter: Payer: Self-pay | Admitting: Gastroenterology

## 2013-10-07 DIAGNOSIS — R55 Syncope and collapse: Secondary | ICD-10-CM

## 2013-10-07 DIAGNOSIS — R404 Transient alteration of awareness: Secondary | ICD-10-CM

## 2013-10-07 LAB — CANCER ANTIGEN 19-9: CA 19 9: 57.7 U/mL — AB (ref <35.0)

## 2013-10-07 LAB — POTASSIUM: Potassium: 4.3 mEq/L (ref 3.7–5.3)

## 2013-10-07 LAB — GLUCOSE, CAPILLARY: Glucose-Capillary: 111 mg/dL — ABNORMAL HIGH (ref 70–99)

## 2013-10-07 MED ORDER — LORAZEPAM 1 MG PO TABS: 1.0000 mg | ORAL_TABLET | Freq: Three times a day (TID) | ORAL | Status: DC

## 2013-10-07 NOTE — Procedures (Signed)
ELECTROENCEPHALOGRAM REPORT   Patient: Lindsey Gomez       Room #: 7W62 EEG No. ID: [id number] Age: 68 y.o.        Sex: female Referring Physician: Candiss Norse Report Date:  10/06/2013        Interpreting Physician: Alexis Goodell D  History: Lindsey Gomez is an 68 y.o. 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 amitriptyline  25-50 mg Oral QHS  . atenolol  100 mg Oral [id number]  . cefTRIAXone (ROCEPHIN)  IV  1 g Intravenous Q24H  . heparin  5,000 Units Subcutaneous 3 times per day  . hydrocortisone  10 mg Oral BID  . insulin aspart  0-9 Units Subcutaneous TID WC  . insulin detemir  8 Units Subcutaneous QHS  . lactose free nutrition  237 mL Oral TID BM  . LORazepam  1 mg Oral TID  . nicotine  21 mg Transdermal Daily  . OxyCODONE  10 mg Oral 3 times per day  . pantoprazole  40 mg Oral Daily  . potassium chloride  40 mEq Oral Daily  . tamoxifen  20 mg Oral BID  . thiamine  100 mg Intravenous Daily  . topiramate  50 mg Oral BID  . Vortioxetine HBr  10 mg Oral Daily    Conditions of Recording:  This is a 16 channel EEG carried out with the patient in the drowsy and asleep states.  Description:  The patient was not fully awake during the recording and therefore a waking background rhythm could not be evaluated.    During drowse the background activity consists of a mixture of theta and delta activity that is poorly organized and of low voltage.  This is diffusely distributed and continuous.  Faster frequencies are less prevalent.   The patient goes in to a light sleep with symmetrical sleep spindles, vertex central sharp transients and irregular slow activity.   Hyperventilation was not performed.  Intermittent photic stimulation was performed but failed to illicit any change in the tracing.     IMPRESSION: This is a normal drowsy and asleep electroencephalogram.  No epileptiform activity is noted.     Alexis Goodell,  MD [facility] [phone] 10/07/2013, 7:41 AM

## 2013-10-07 NOTE — Telephone Encounter (Signed)
Stick with just hydrocodone 10 given at the hospital, stop the other two

## 2013-10-07 NOTE — Telephone Encounter (Signed)
Pt's daughter is aware 

## 2013-10-07 NOTE — Progress Notes (Signed)
Lindsey Gomez to be [facility] per MD order.  Discussed with the patient and all questions fully answered.    Medication List         amitriptyline 50 MG tablet  Commonly known as:  ELAVIL  Take 25-50 mg by mouth at bedtime.     atenolol 100 MG tablet  Commonly known as:  TENORMIN  Take 1 tablet (100 mg total) by mouth every morning.     atorvastatin 10 MG tablet  Commonly known as:  LIPITOR  Take 1 tablet (10 mg total) by mouth daily.     calcitRIOL 0.5 MCG capsule  Commonly known as:  ROCALTROL  Take 2 capsules (1 mcg total) by mouth daily.     cefUROXime 500 MG tablet  Commonly known as:  CEFTIN  Take 1 tablet (500 mg total) by mouth 2 (two) times daily with a meal.     docusate sodium 100 MG capsule  Commonly known as:  COLACE  Take 100 mg by mouth daily as needed for mild constipation.     eletriptan 40 MG tablet  Commonly known as:  RELPAX  Take 40 mg by mouth as needed for migraine or headache. One tablet by mouth at onset of headache. May repeat in 2 hours if headache persists or recurs.     GLUCAGON EMERGENCY 1 MG injection  Generic drug:  glucagon  Inject 1 mg into the vein once as needed (for glucose).     hydrocortisone 10 MG tablet  Commonly known as:  CORTEF  Take 1 tablet (10 mg total) by mouth 2 (two) times daily.     insulin aspart 100 UNIT/ML FlexPen  Commonly known as:  NOVOLOG  Inject 4-10 Units into the skin 3 (three) times daily with meals. Inject 10 units subq with breakfast, inject 4 units subq with lunch and inject 5 units with evening meal.     insulin detemir 100 UNIT/ML injection  Commonly known as:  LEVEMIR  Inject 8 Units into the skin at bedtime.     loperamide 2 MG tablet  Commonly known as:  IMODIUM A-Gomez  Take 1 tablet (2 mg total) by mouth 4 (four) times daily as needed for diarrhea or loose stools.     LORazepam 1 MG tablet  Commonly known as:  ATIVAN  Take 1 tablet (1 mg total) by mouth 3 (three) times daily.     mometasone  50 MCG/ACT nasal spray  Commonly known as:  NASONEX  Place 2 sprays into both nostrils daily.     omeprazole 40 MG capsule  Commonly known as:  PRILOSEC  Take 1 capsule (40 mg total) by mouth 2 (two) times daily.     OxyCODONE 10 mg T12a 12 hr tablet  Commonly known as:  OXYCONTIN  Take 1 tablet (10 mg total) by mouth 3 (three) times daily.     prochlorperazine 10 MG tablet  Commonly known as:  COMPAZINE  Take 10 mg by mouth every 8 (eight) hours as needed for nausea or vomiting.     tamoxifen 20 MG tablet  Commonly known as:  NOLVADEX  Take 20 mg by mouth 2 (two) times daily.     topiramate 100 MG tablet  Commonly known as:  TOPAMAX  Take 0.5 tablets (50 mg total) by mouth 2 (two) times daily.     triamcinolone ointment 0.1 %  Commonly known as:  KENALOG  Apply 1 application topically 2 (two) times daily as needed (itching).  Vortioxetine HBr 20 MG Tabs  Commonly known as:  BRINTELLIX  Take 10 mg by mouth daily.     zolmitriptan 5 MG tablet  Commonly known as:  ZOMIG  Take 5 mg by mouth daily as needed for migraine.        VVS, Skin clean, dry and intact without evidence of skin break down, no evidence of skin tears noted. IV catheter discontinued intact. Site without signs and symptoms of complications. Dressing and pressure applied.  An After Visit Summary was printed and given to the patient.  Gomez/c education completed with patient/family including follow up instructions, medication list, Gomez/c activities limitations if indicated, with other Gomez/c instructions as indicated by MD - patient able to verbalize understanding, all questions fully answered.   Patient instructed to return to ED, call 911, or call MD for any changes in condition.   Patient escorted via [place], and Gomez/C home via private auto.  Discussed discharge information with daughter and verbalized understanding by answering and asking questions.   Lindsey Gomez 10/07/2013 1:15 PM

## 2013-10-07 NOTE — Discharge Instructions (Signed)
Follow with Primary MD Walker Kehr, MD in 3 days   Get CBC, CMP, 2 view Chest X ray checked  by Primary MD next visit.    Activity: As tolerated with Full fall precautions use walker/cane & assistance as needed   Disposition Home     Diet: Heart Healthy with feeding assistance and aspiration precautions if needed.  For Heart failure patients - Check your Weight same time everyday, if you gain over 2 pounds, or you develop in leg swelling, experience more shortness of breath or chest pain, call your Primary MD immediately. Follow Cardiac Low Salt Diet and 1.8 lit/day fluid restriction.   On your next visit with her primary care physician please Get Medicines reviewed and adjusted.  Please request your Prim.MD to go over all Hospital Tests and Procedure/Radiological results at the follow up, please get all Hospital records sent to your Prim MD by signing hospital release before you go home.   If you experience worsening of your admission symptoms, develop shortness of breath, life threatening emergency, suicidal or homicidal thoughts you must seek medical attention immediately by calling 911 or calling your MD immediately  if symptoms less severe.  You Must read complete instructions/literature along with all the possible adverse reactions/side effects for all the Medicines you take and that have been prescribed to you. Take any new Medicines after you have completely understood and accpet all the possible adverse reactions/side effects.   Do not drive, operating heavy machinery, perform activities at heights, swimming or participation in water activities or provide baby sitting services until you have seen by Primary MD or a Neurologist and advised to do so again.  Do not drive when taking Pain medications.    Do not take more than prescribed Pain, Sleep and Anxiety Medications  Special Instructions: If you have smoked or chewed Tobacco  in the last 2 yrs please stop smoking, stop  any regular Alcohol  and or any Recreational drug use.  Wear Seat belts while driving.   Please note  You were cared for by a hospitalist during your hospital stay. If you have any questions about your discharge medications or the care you received while you were in the hospital after you are discharged, you can call the unit and asked to speak with the hospitalist on call if the hospitalist that took care of you is not available. Once you are discharged, your primary care physician will handle any further medical issues. Please note that NO REFILLS for any discharge medications will be authorized once you are discharged, as it is imperative that you return to your primary care physician (or establish a relationship with a primary care physician if you do not have one) for your aftercare needs so that they can reassess your need for medications and monitor your lab values.

## 2013-10-07 NOTE — Telephone Encounter (Signed)
Pt's daughter call stated that Lindsey Gomez was in the hospital and got discharge today. Pt stated that Dr. Camila Li gave her new rx for hydrocodone 5mg  when she was here on 10/01/13 but the hospital gave her hydrocodone 10 mg when she got out of the hospital, pt is not sure which one to take. Pt also would like to know that can she still take the Oxycontin for pain if she need it. Please advise, pt request the answer today if possible.

## 2013-10-09 NOTE — ED Provider Notes (Signed)
Medical screening examination/treatment/procedure(s) were performed by non-physician practitioner and as supervising physician I was immediately available for consultation/collaboration.   EKG Interpretation   Date/Time:  Thursday October 01 2013 14:00:22 EDT Ventricular Rate:  69 PR Interval:  160 QRS Duration: 74 QT Interval:  409 QTC Calculation: 438 R Axis:   75 Text Interpretation:  Sinus rhythm Nonspecific T abnormalities, lateral  leads Confirmed by Jeneen Rinks  MD, [place] ([id number]) on 10/01/2013 2:09:38 PM        Tanna Furry, MD 10/09/13 0025

## 2013-10-10 ENCOUNTER — Other Ambulatory Visit: Payer: Self-pay | Admitting: Internal Medicine

## 2013-10-12 ENCOUNTER — Ambulatory Visit (INDEPENDENT_AMBULATORY_CARE_PROVIDER_SITE_OTHER): Payer: Medicare Other | Admitting: Neurology

## 2013-10-12 ENCOUNTER — Encounter: Payer: Self-pay | Admitting: Neurology

## 2013-10-12 VITALS — BP 145/86 | HR 75 | Ht 62.0 in | Wt 90.0 lb

## 2013-10-12 DIAGNOSIS — R413 Other amnesia: Secondary | ICD-10-CM

## 2013-10-12 DIAGNOSIS — R269 Unspecified abnormalities of gait and mobility: Secondary | ICD-10-CM

## 2013-10-12 DIAGNOSIS — IMO0001 Reserved for inherently not codable concepts without codable children: Secondary | ICD-10-CM

## 2013-10-12 DIAGNOSIS — G43119 Migraine with aura, intractable, without status migrainosus: Secondary | ICD-10-CM

## 2013-10-12 DIAGNOSIS — G43819 Other migraine, intractable, without status migrainosus: Secondary | ICD-10-CM

## 2013-10-12 DIAGNOSIS — R569 Unspecified convulsions: Secondary | ICD-10-CM | POA: Insufficient documentation

## 2013-10-12 HISTORY — DX: Unspecified convulsions: R56.9

## 2013-10-12 HISTORY — DX: Other amnesia: R41.3

## 2013-10-12 HISTORY — DX: Unspecified abnormalities of gait and mobility: R26.9

## 2013-10-12 NOTE — Patient Instructions (Signed)
Fall Prevention and Home Safety °Falls cause injuries and can affect all age groups. It is possible to prevent falls.  °HOW TO PREVENT FALLS °· Wear shoes with rubber soles that do not have an opening for your toes. °· Keep the inside and outside of your house well lit. °· Use night lights throughout your home. °· Remove clutter from floors. °· Clean up floor spills. °· Remove throw rugs or fasten them to the floor with carpet tape. °· Do not place electrical cords across pathways. °· Put grab bars by your tub, shower, and toilet. Do not use towel bars as grab bars. °· Put handrails on both sides of the stairway. Fix loose handrails. °· Do not climb on stools or stepladders, if possible. °· Do not wax your floors. °· Repair uneven or unsafe sidewalks, walkways, or stairs. °· Keep items you use a lot within reach. °· Be aware of pets. °· Keep emergency numbers next to the telephone. °· Put smoke detectors in your home and near bedrooms. °Ask your doctor what other things you can do to prevent falls. °Document Released: 12/23/2008 Document Revised: 08/28/2011 Document Reviewed: 05/29/2011 °[employer]® Patient Information ©2015 [employer]. This information is not intended to replace advice given to you by your health care provider. Make sure you discuss any questions you have with your health care provider. ° °

## 2013-10-12 NOTE — Progress Notes (Addendum)
Reason for visit: Seizure  Lindsey Gomez is a 68 y.o. female  History of present illness:  Lindsey Gomez is a 68 year old right-handed Lindsey Gomez female a history of significant weight loss over the last 3 months. The patient has lost about 30 pounds associated with nausea. The patient has had bile duct stenosis, and she recently required a hospitalization. The patient was taking Ativan at home taking 2 mg 3 times daily, and the dosing may have been cut back in the hospital. The patient was felt to have benzodiazepine withdrawal, with a generalized seizure event while in the hospital. The patient underwent a CT scan of the brain that was notable for some mild small vessel disease, no acute changes were seen. EEG was done, without epileptiform discharges, but some slowing was seen. The patient has no recollection of the seizure event. The patient denies any new numbness or weakness of the face, arms, or legs. She does have some problems with weakness in the legs. The family indicates that she has had a change in her ambulatory ability over the last several months, and she is shuffling her feet. She is now using a walker for ambulation. She denies problems controlling the bowels or the bladder. She has not had any problems with tremor. The patient has had some issues with memory as well over the last several months. She reports chronic daily headaches dating back multiple decades. The last day without headache was greater than 20 years ago. The patient is on Topamax taking 50 mg twice daily for this, and she is on OxyContin 10 mg twice daily. Memory has been poor for a number of months. The patient does not operate a motor vehicle. She is sent to this office for an evaluation.  Past Medical History  Diagnosis Date  . Depression   . Type II or unspecified type diabetes mellitus without mention of complication, not stated as uncontrolled   . HTN (hypertension)   . Chronic pain   . Fibromyalgia   . Migraines    . History of breast cancer     Right  . Chronic pancreatitis   . Vitamin D deficiency   . GERD (gastroesophageal reflux disease)   . Finger dislocation 2009    L 3rd  . COPD (chronic obstructive pulmonary disease)   . Anemia   . Fractured sternum 11/2008  . Osteoporosis     Reclast too expensive  . Barrett's esophagus   . Chronic fatigue   . Diverticulosis   . Opioid dependence   . Cancer     HX OF BREAST CANCER   . Convulsions/seizures 10/12/2013    Possible benzodiazepine withdrawal seizure  . 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 Abnormality of gait 10/12/2013    Past Surgical History  Procedure Laterality Date  . Nasal sinus surgery    . Pancreatectomy      40%  . Cholecystectomy    . Appendectomy    . Abdominal hysterectomy    . Lobectomy Left 2005    granulomatous lesion.   . Breast lumpectomy Left   . Ercp N/A 10/05/2013    Procedure: ENDOSCOPIC RETROGRADE CHOLANGIOPANCREATOGRAPHY (ERCP);  Surgeon: Ladene Artist, MD;  Location: [facility] ENDOSCOPY;  Service: Endoscopy;  Laterality: N/A;  . Cataract extraction Bilateral     Family History  Problem Relation Age of Onset  . Heart attack Brother   . COPD Father   . Heart disease Father   . Leukemia Mother   . Diabetes Maternal  Uncle   . Dementia Sister   . Alzheimer's disease Sister   . Heart attack Brother     Social history:  reports that she has been smoking Cigarettes.  She has a 80 pack-year smoking history. She has never used smokeless tobacco. She reports that she does not drink alcohol or use illicit drugs.  Medications:  Current Outpatient Prescriptions on File Prior to Visit  Medication Sig Dispense Refill  . amitriptyline (ELAVIL) 50 MG tablet Take 25-50 mg by mouth at bedtime.      [name] atenolol (TENORMIN) 100 MG tablet Take 1 tablet (100 mg total) by mouth every morning.  30 tablet  11  . atorvastatin (LIPITOR) 10 MG tablet Take 1 tablet (10 mg total) by mouth daily.  30 tablet  11  . calcitRIOL (ROCALTROL) 0.5  MCG capsule Take 2 capsules (1 mcg total) by mouth daily.  60 capsule  11  . cefUROXime (CEFTIN) 500 MG tablet Take 1 tablet (500 mg total) by mouth 2 (two) times daily with a meal.  4 tablet  0  . docusate sodium (COLACE) 100 MG capsule Take 100 mg by mouth daily as needed for mild constipation.      [name] eletriptan (RELPAX) 40 MG tablet Take 40 mg by mouth as needed for migraine or headache. One tablet by mouth at onset of headache. May repeat in 2 hours if headache persists or recurs.      [name] glucagon (GLUCAGON EMERGENCY) 1 MG injection Inject 1 mg into the vein once as needed (for glucose).      . hydrocortisone (CORTEF) 10 MG tablet Take 1 tablet (10 mg total) by mouth 2 (two) times daily.  60 tablet  11  . insulin aspart (NOVOLOG) 100 UNIT/ML FlexPen Inject 4-10 Units into the skin 3 (three) times daily with meals. Inject 10 units subq with breakfast, inject 4 units subq with lunch and inject 5 units with evening meal.      . insulin detemir (LEVEMIR) 100 UNIT/ML injection Inject 8 Units into the skin at bedtime.       [name] loperamide (IMODIUM A-D) 2 MG tablet Take 1 tablet (2 mg total) by mouth 4 (four) times daily as needed for diarrhea or loose stools.  30 tablet  0  . LORazepam (ATIVAN) 1 MG tablet Take 1 tablet (1 mg total) by mouth 3 (three) times daily.  60 tablet  3  . mometasone (NASONEX) 50 MCG/ACT nasal spray Place 2 sprays into both nostrils daily.      [name] omeprazole (PRILOSEC) 40 MG capsule Take 1 capsule (40 mg total) by mouth 2 (two) times daily.  60 capsule  11  . OxyCODONE (OXYCONTIN) 10 mg T12A 12 hr tablet Take 1 tablet (10 mg total) by mouth 3 (three) times daily.  90 tablet  0  . prochlorperazine (COMPAZINE) 10 MG tablet Take 10 mg by mouth every 8 (eight) hours as needed for nausea or vomiting.      . tamoxifen (NOLVADEX) 20 MG tablet Take 20 mg by mouth 2 (two) times daily.        [name] topiramate (TOPAMAX) 100 MG tablet Take 0.5 tablets (50 mg total) by mouth 2 (two) times daily.  60  tablet  11  . triamcinolone (KENALOG) 0.1 % ointment Apply 1 application topically 2 (two) times daily as needed (itching).       . Vortioxetine HBr (BRINTELLIX) 20 MG TABS Take 10 mg by mouth daily.  30 tablet  5  .  zolmitriptan (ZOMIG) 5 MG tablet TAKE 1 TABLET BY MOUTH AS NEEDED FOR MIGRAINE  8 tablet  5   Current Facility-Administered Medications on File Prior to Visit  Medication Dose Route Frequency Provider Last Rate Last Dose  . 0.9 %  sodium chloride infusion  500 mL Intravenous Continuous Inda Castle, MD      . promethazine (PHENERGAN) injection 50 mg  50 mg Intramuscular Q6H PRN Lew Dawes V, MD   50 mg at 03/20/13 1601      Allergies  Allergen Reactions  . Milnacipran Swelling    Headache and hand swelling   . Rizatriptan Benzoate Nausea And Vomiting  . Gabapentin Other (See Comments)    Weak muscles  . Moxifloxacin Other (See Comments)    Unknown  . Sulfonamide Derivatives Rash  . Venlafaxine Other (See Comments)    Unknown    ROS:  Out of a complete 14 system review of symptoms, the patient complains only of the following symptoms, and all other reviewed systems are negative.  Weight loss, fatigue Memory loss, confusion, headache, weakness, slurred speech Sleepiness  Blood pressure 145/86, pulse 75, height 5\' 2"  (1.575 m), weight 90 lb (40.824 kg).  Physical Exam  General: The patient is alert and cooperative at the time of the examination. The patient appears to be quite thin.  Eyes: Pupils are equal, round, and reactive to light. Discs are flat bilaterally.  Neck: The neck is supple, no carotid bruits are noted.  Respiratory: The respiratory examination is clear.  Cardiovascular: The cardiovascular examination reveals a regular rate and rhythm, no obvious murmurs or rubs are noted.  Skin: Extremities are without significant edema.  Neurologic Exam  Mental status: The Mini-Mental status examination done today shows a total score  21/30.  Cranial nerves: Facial symmetry is present. There is good sensation of the face to pinprick and soft touch bilaterally. The strength of the facial muscles and the muscles to head turning and shoulder shrug are normal bilaterally. Speech is well enunciated, no aphasia or dysarthria is noted. Extraocular movements are full. Visual fields are full. The tongue is midline, and the patient has symmetric elevation of the soft palate. No obvious hearing deficits are noted. Masking of the face is seen.  Motor: The motor testing reveals 5 over 5 strength of all 4 extremities. Good symmetric motor tone is noted throughout.  Sensory: Sensory testing is intact to pinprick, soft touch, vibration sensation, and position sense on all 4 extremities, with exception that the position sense is decreased on the right foot. No evidence of extinction is noted.  Coordination: Cerebellar testing reveals good finger-nose-finger and heel-to-shin bilaterally.  Gait and station: Gait is stooped, the patient is able to ambulate independently, decreased arm swing is seen. Tandem gait is slightly unsteady. The patient usually uses a walker for ambulation. Romberg is negative. No drift is seen.  Reflexes: Deep tendon reflexes are symmetric, but are depressed bilaterally. Toes are downgoing bilaterally.   CT head 10/06/13:  IMPRESSION:  No acute intracranial abnormality. 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     EEG 10/07/13:  IMPRESSION: This is a normal drowsy and asleep electroencephalogram. No  epileptiform activity is noted.     Assessment/Plan:  1. Seizure, possible benzodiazepine withdrawal seizure  2. Memory disturbance  3. Gait disturbance, parkinsonism  4. Chronic daily headache  The patient appears to have features of parkinsonism on clinical examination. This will need to be followed over time. The patient was reduced  on her dose of Ativan, which could have resulted in a seizure  event. The patient is on Topamax for her migraine headaches, taking 50 mg twice daily. This unfortunately may also promote weight loss, and may have cognitive side effects. The patient be sent for blood work today, and she will have MRI evaluation of the brain. She will followup in 3-4 months. The patient does not operate a motor vehicle. If the patient continues to lose weight, or if she is unable to gain weight, the Topamax may need to be discontinued. Given the chronic daily headaches, the patient will be considered for Botox therapy.  Jill Alexanders MD 10/12/2013 8:24 PM  [facility] [address] [place], [place] [address]  Phone [phone] Fax [phone]

## 2013-10-14 LAB — VITAMIN B12: VITAMIN B 12: 705 pg/mL (ref 211–946)

## 2013-10-14 LAB — SEDIMENTATION RATE: Sed Rate: 5 mm/hr (ref 0–40)

## 2013-10-14 LAB — VITAMIN B1, WHOLE BLOOD: THIAMINE: 256.8 nmol/L — AB (ref 66.5–200.0)

## 2013-10-15 ENCOUNTER — Ambulatory Visit: Payer: Medicare Other | Admitting: Internal Medicine

## 2013-10-16 ENCOUNTER — Encounter: Payer: Self-pay | Admitting: Internal Medicine

## 2013-10-16 ENCOUNTER — Ambulatory Visit (INDEPENDENT_AMBULATORY_CARE_PROVIDER_SITE_OTHER): Payer: Medicare Other | Admitting: Internal Medicine

## 2013-10-16 VITALS — BP 133/79 | HR 76 | Temp 97.9°F | Wt 90.0 lb

## 2013-10-16 DIAGNOSIS — R296 Repeated falls: Secondary | ICD-10-CM

## 2013-10-16 DIAGNOSIS — I10 Essential (primary) hypertension: Secondary | ICD-10-CM

## 2013-10-16 DIAGNOSIS — E1049 Type 1 diabetes mellitus with other diabetic neurological complication: Secondary | ICD-10-CM

## 2013-10-16 DIAGNOSIS — F1129 Opioid dependence with unspecified opioid-induced disorder: Secondary | ICD-10-CM

## 2013-10-16 DIAGNOSIS — R404 Transient alteration of awareness: Secondary | ICD-10-CM

## 2013-10-16 DIAGNOSIS — F1999 Other psychoactive substance use, unspecified with unspecified psychoactive substance-induced disorder: Secondary | ICD-10-CM

## 2013-10-16 DIAGNOSIS — Z9181 History of falling: Secondary | ICD-10-CM

## 2013-10-16 DIAGNOSIS — F1120 Opioid dependence, uncomplicated: Secondary | ICD-10-CM

## 2013-10-16 DIAGNOSIS — F29 Unspecified psychosis not due to a substance or known physiological condition: Secondary | ICD-10-CM

## 2013-10-16 MED ORDER — ONDANSETRON HCL 4 MG PO TABS: 4.0000 mg | ORAL_TABLET | Freq: Three times a day (TID) | ORAL | Status: DC | PRN

## 2013-10-16 MED ORDER — AMITRIPTYLINE HCL 50 MG PO TABS: 25.0000 mg | ORAL_TABLET | Freq: Every day | ORAL | Status: DC

## 2013-10-16 MED ORDER — OXYCODONE HCL ER 10 MG PO T12A: 10.0000 mg | EXTENDED_RELEASE_TABLET | Freq: Two times a day (BID) | ORAL | Status: DC

## 2013-10-16 NOTE — Progress Notes (Signed)
Mckinnley is here with her dtr Ivin Booty   Subjective:   C/o sleeping x 18 hrs/d lately, not eating x 2 wks. FTT [name] cough -  better after abx, now is coming back - green mucus. Mirelle fell on 7/22, hit L temple  C/o n/v; lost wt  Migraine  This is a chronic problem. The current episode started more than 1 year ago. The problem occurs daily. The problem has been waxing and waning (may be better a little). The pain is located in the bilateral region. The pain radiates to the right neck and left neck. The pain quality is similar to prior headaches. The quality of the pain is described as shooting. The pain is at a severity of 7/10. The pain is severe. Associated symptoms include coughing, dizziness, muscle aches, nausea, vomiting and weakness. Pertinent negatives include no ear pain, fever, neck pain or rhinorrhea. The symptoms are aggravated by fatigue. Her past medical history is significant for cancer and migraine headaches.     Yesly denies OSA [name] severe migraines - better. [name] on severe pain - neck, back.  The patient presents for a follow-up of chronic HAs, depression, anxiety, hypertension, chronic dyslipidemia, type 1 diabetes.   [name] is doing ok, still stressed. [name] more anxiety; depressed - [employer] is helping at home. Aftan has a housekeeper [employer] from Last 3 Encounters:  10/16/13 90 lb (40.824 kg)  10/12/13 90 lb (40.824 kg)  10/07/13 95 lb 12.8 oz (43.455 kg)   BP Readings from Last 3 Encounters:  10/16/13 133/79  10/12/13 145/86  10/07/13 132/80      Review of Systems  Constitutional: Positive for activity change, appetite change, fatigue and unexpected weight change. Negative for fever and chills.  HENT: Negative for congestion, ear pain, mouth sores, rhinorrhea and voice change.   Eyes: Negative for visual disturbance.  Respiratory: Positive for cough and shortness of breath. Negative for chest tightness and wheezing.   Cardiovascular: Negative for leg  swelling.  Gastrointestinal: Positive for nausea and vomiting.  Genitourinary: Negative for urgency, frequency, difficulty urinating and vaginal pain.  Musculoskeletal: Positive for arthralgias and gait problem. Negative for neck pain and neck stiffness.  Skin: Positive for wound. Negative for pallor and rash.  Neurological: Positive for dizziness, weakness, light-headedness and headaches. Negative for tremors and syncope.  Psychiatric/Behavioral: Positive for behavioral problems, sleep disturbance and decreased concentration. Negative for suicidal ideas, confusion, self-injury, dysphoric mood and agitation. The patient is nervous/anxious.        Objective:   Physical Exam  Constitutional: She appears well-developed.  Ill appearing  HENT:  Head: Normocephalic.  Right Ear: External ear normal.  Left Ear: External ear normal.  Nose: Nose normal.  Mouth/Throat: Oropharynx is clear and moist. No oropharyngeal exudate.  dry  Eyes: Conjunctivae are normal. Pupils are equal, round, and reactive to light. Right eye exhibits no discharge. Left eye exhibits no discharge.  Neck: Normal range of motion. Neck supple. No JVD present. No tracheal deviation present. No thyromegaly present.  Cardiovascular: Normal rate, regular rhythm and normal heart sounds.   Pulmonary/Chest: No stridor. No respiratory distress. She has no wheezes.  Abdominal: Soft. Bowel sounds are normal. She exhibits no distension and no mass. There is no tenderness. There is no rebound and no guarding.  Musculoskeletal: She exhibits no edema and no tenderness.  Lymphadenopathy:    She has no cervical adenopathy.  Neurological: She displays normal reflexes. No cranial nerve deficit. She exhibits normal muscle  tone. Coordination abnormal.  Ataxic Slow speech Flat affect  Skin: Skin is warm. No rash noted. She is not diaphoretic. No erythema.  not somnolent, cachectic  Edentulous Walker  Lab Results  Component Value Date    WBC 4.4 10/03/2013   HGB 9.3* 10/03/2013   HCT 27.7* 10/03/2013   PLT 118* 10/03/2013   GLUCOSE 211* 10/06/2013   CHOL 123 09/10/2013   TRIG 82.0 09/10/2013   HDL 72.80 09/10/2013   LDLDIRECT 131.9 06/15/2010   LDLCALC 34 09/10/2013   ALT 48* 10/05/2013   AST 22 10/05/2013   NA 142 10/06/2013   K 4.3 10/07/2013   CL 112 10/06/2013   CREATININE 1.01 10/06/2013   BUN 13 10/06/2013   CO2 18* 10/06/2013   TSH 1.93 03/20/2013   INR 0.95 10/02/2013   HGBA1C 7.0* 09/30/2013   MICROALBUR 0.8 08/16/2011    Assessment & Plan:

## 2013-10-16 NOTE — Progress Notes (Signed)
Pre visit review using our clinic review tool, if applicable. No additional management support is needed unless otherwise documented below in the visit note. 

## 2013-10-17 ENCOUNTER — Ambulatory Visit
Admission: RE | Admit: 2013-10-17 | Discharge: 2013-10-17 | Disposition: A | Discharge status: Home / Self Care | Payer: Medicare Other | Source: Ambulatory Visit | Attending: Neurology | Admitting: Neurology

## 2013-10-17 DIAGNOSIS — G43809 Other migraine, not intractable, without status migrainosus: Secondary | ICD-10-CM

## 2013-10-17 DIAGNOSIS — R569 Unspecified convulsions: Secondary | ICD-10-CM

## 2013-10-17 DIAGNOSIS — R413 Other amnesia: Secondary | ICD-10-CM

## 2013-10-17 DIAGNOSIS — R269 Unspecified abnormalities of gait and mobility: Secondary | ICD-10-CM

## 2013-10-17 DIAGNOSIS — G43119 Migraine with aura, intractable, without status migrainosus: Secondary | ICD-10-CM

## 2013-10-17 DIAGNOSIS — IMO0001 Reserved for inherently not codable concepts without codable children: Secondary | ICD-10-CM

## 2013-10-17 DIAGNOSIS — G43819 Other migraine, intractable, without status migrainosus: Secondary | ICD-10-CM

## 2013-10-21 ENCOUNTER — Ambulatory Visit: Payer: Medicare Other | Admitting: Internal Medicine

## 2013-10-22 DIAGNOSIS — K861 Other chronic pancreatitis: Secondary | ICD-10-CM

## 2013-10-22 DIAGNOSIS — IMO0001 Reserved for inherently not codable concepts without codable children: Secondary | ICD-10-CM

## 2013-10-22 DIAGNOSIS — K831 Obstruction of bile duct: Secondary | ICD-10-CM

## 2013-10-24 ENCOUNTER — Other Ambulatory Visit: Payer: Self-pay | Admitting: Internal Medicine

## 2013-10-25 NOTE — Assessment & Plan Note (Signed)
We are reducing opioids

## 2013-10-25 NOTE — Assessment & Plan Note (Signed)
Continue with current prescription therapy as reflected on the Med list.  

## 2013-10-25 NOTE — Assessment & Plan Note (Signed)
Improved

## 2013-10-25 NOTE — Assessment & Plan Note (Signed)
Better  

## 2013-10-25 NOTE — Assessment & Plan Note (Signed)
Better Cont to reduce opioids

## 2013-10-27 ENCOUNTER — Telehealth: Payer: Self-pay | Admitting: Neurology

## 2013-10-27 NOTE — Telephone Encounter (Signed)
  I called patient. MRI the brain is relatively unremarkable, showing only atrophy. We will follow the patient conservatively, she will followup through the office in several months. She is not to operate a motor vehicle until further notice.   MRI brain 10/18/2013:  Impression   Abnormal MRI scan of the brain showing mild generalized  cerebral atrophy which is age disproportionate.

## 2013-10-31 ENCOUNTER — Other Ambulatory Visit: Payer: Self-pay | Admitting: Internal Medicine

## 2013-11-05 ENCOUNTER — Encounter: Payer: Self-pay | Admitting: Physician Assistant

## 2013-11-05 ENCOUNTER — Other Ambulatory Visit (INDEPENDENT_AMBULATORY_CARE_PROVIDER_SITE_OTHER): Payer: Medicare Other

## 2013-11-05 ENCOUNTER — Ambulatory Visit (INDEPENDENT_AMBULATORY_CARE_PROVIDER_SITE_OTHER): Payer: Medicare Other | Admitting: Physician Assistant

## 2013-11-05 VITALS — BP 120/86 | HR 72 | Ht 61.0 in | Wt 93.1 lb

## 2013-11-05 DIAGNOSIS — K831 Obstruction of bile duct: Secondary | ICD-10-CM

## 2013-11-05 DIAGNOSIS — R634 Abnormal weight loss: Secondary | ICD-10-CM

## 2013-11-05 DIAGNOSIS — R110 Nausea: Secondary | ICD-10-CM

## 2013-11-05 LAB — COMPREHENSIVE METABOLIC PANEL
ALBUMIN: 3.5 g/dL (ref 3.5–5.2)
ALT: 15 U/L (ref 0–35)
AST: 25 U/L (ref 0–37)
Alkaline Phosphatase: 70 U/L (ref 39–117)
BUN: 21 mg/dL (ref 6–23)
CALCIUM: 10.1 mg/dL (ref 8.4–10.5)
CO2: 25 mEq/L (ref 19–32)
Chloride: 97 mEq/L (ref 96–112)
Creatinine, Ser: 1.3 mg/dL — ABNORMAL HIGH (ref 0.4–1.2)
GFR: 42.36 mL/min — ABNORMAL LOW (ref >60.00)
GLUCOSE: 216 mg/dL — AB (ref 70–99)
POTASSIUM: 3.5 meq/L (ref 3.5–5.1)
Sodium: 131 mEq/L — ABNORMAL LOW (ref 135–145)
Total Bilirubin: 0.5 mg/dL (ref 0.2–1.2)
Total Protein: 6.7 g/dL (ref 6.0–8.3)

## 2013-11-05 LAB — CBC WITH DIFFERENTIAL/PLATELET
BASOS PCT: 0.5 % (ref 0.0–3.0)
Basophils Absolute: 0.1 10*3/uL (ref 0.0–0.1)
EOS ABS: 0.1 10*3/uL (ref 0.0–0.7)
EOS PCT: 0.9 % (ref 0.0–5.0)
HCT: 34.7 % — ABNORMAL LOW (ref 36.0–46.0)
Hemoglobin: 11.6 g/dL — ABNORMAL LOW (ref 12.0–15.0)
Lymphocytes Relative: 23.8 % (ref 12.0–46.0)
Lymphs Abs: 2.6 10*3/uL (ref 0.7–4.0)
MCHC: 33.5 g/dL (ref 30.0–36.0)
MCV: 90.6 fl (ref 78.0–100.0)
Monocytes Absolute: 0.8 10*3/uL (ref 0.1–1.0)
Monocytes Relative: 7 % (ref 3.0–12.0)
NEUTROS PCT: 67.8 % (ref 43.0–77.0)
Neutro Abs: 7.3 10*3/uL (ref 1.4–7.7)
Platelets: 232 10*3/uL (ref 150.0–400.0)
RBC: 3.82 Mil/uL — AB (ref 3.87–5.11)
RDW: 14.9 % (ref 11.5–15.5)
WBC: 10.8 10*3/uL — AB (ref 4.0–10.5)

## 2013-11-05 NOTE — Progress Notes (Signed)
Subjective:    Patient ID: Lindsey Gomez, female    DOB: 16-Aug-1945, 68 y.o.   MRN: [id number]  HPI Lindsey Gomez is a 68 year old [place] female known to Dr. Deatra Ina from prior endoscopy done in 2012  . She was hospitalized in July of 2015 with multiple medical issues including a toxic metabolic encephalopathy. She is undergoing neurologic workup growth because of some dementia issues memory disturbance and chronic headaches. During her admission , she underwent GI imaging because of complaints of decrease in appetite and significant weight loss. She also complained of intermittent nausea and vomiting. She apparently had had a 35 pound weight loss over the past 4-5 months. She was found to have a significant biliary dilation on MRI/MRCP which was noncontrasted. There was no obvious mass seen. CA 19-9 was mildly elevated at 57.7. She underwent ERCP per Dr. Fuller Plan with finding of a prominent edematous ampulla and common bile duct and intrahepatic biliary dilation with a distal stricture. She was stented with a 10 French 5 cm plastic stent. Cytologies were done. Cytologies returned showing atypia consistent with a reactive atypia and no definite malignancy. Patient does have prior history of breast cancer for which she underwent a lumpectomy chemotherapy and radiation. 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 Dossie Der. Patient and her caregivers state that she's been doing fairly well since release from the hospital. She's had no fever or chills. She continues to complain of some nausea and intermittent vomiting. Her appetite has been a bit better and according to them she has gained 3 pounds. She has ongoing significant problems with constipation has been taking MiraLax and actually had diarrhea  on the way here today. Patient says she's having chronic daily headaches and is waiting for followup appointment with Dr. Jannifer Franklin. She also had an MRI of her head and is waiting for those results.  Review of Systems  Constitutional: Positive for appetite change, fatigue and unexpected weight change.  HENT: Negative.   Eyes: Negative.   Respiratory: Negative.   Gastrointestinal: Positive for nausea, diarrhea and constipation.  Endocrine: Positive for cold intolerance.  Genitourinary: Negative.   Musculoskeletal: Negative.   Skin: Negative.   Allergic/Immunologic: Negative.   Neurological: Positive for weakness.  Hematological: Negative.   Psychiatric/Behavioral: Negative for confusion.   Outpatient Prescriptions Prior to Visit  Medication Sig Dispense Refill  . ACCU-CHEK AVIVA PLUS test strip 1 each by Other route 5 (five) times daily.       [name] amitriptyline (ELAVIL) 50 MG tablet Take 0.5 tablets (25 mg total) by mouth at bedtime.  30 tablet  1  . atenolol (TENORMIN) 100 MG tablet Take 1 tablet (100 mg total) by mouth every morning.  30 tablet  11  . atorvastatin (LIPITOR) 10 MG tablet Take 1 tablet (10 mg total) by mouth daily.  30 tablet  11  . calcitRIOL (ROCALTROL) 0.5 MCG capsule Take 2 capsules (1 mcg total) by mouth daily.  60 capsule  11  . docusate sodium (COLACE) 100 MG capsule Take 100 mg by mouth daily as needed for mild constipation.      [name] glucagon (GLUCAGON EMERGENCY) 1 MG injection Inject 1 mg into the vein once as needed (for glucose).      . hydrocortisone (CORTEF) 10 MG tablet Take 1 tablet (10 mg total)  by mouth 2 (two) times daily.  60 tablet  11  . insulin aspart (NOVOLOG) 100 UNIT/ML FlexPen Inject 4-10 Units into the skin 3 (three) times daily with meals. Inject 10 units subq with breakfast, inject 4 units subq with lunch and inject 5 units with evening meal.      . insulin detemir (LEVEMIR) 100 UNIT/ML injection Inject 8 Units into the skin at bedtime.       [name]  LORazepam (ATIVAN) 1 MG tablet Take 1 tablet (1 mg total) by mouth 3 (three) times daily.  60 tablet  3  . mometasone (NASONEX) 50 MCG/ACT nasal spray Place 2 sprays into both nostrils daily.      [name] omeprazole (PRILOSEC) 40 MG capsule Take 1 capsule (40 mg total) by mouth 2 (two) times daily.  60 capsule  11  . ondansetron (ZOFRAN) 4 MG tablet Take 1 tablet (4 mg total) by mouth every 8 (eight) hours as needed for nausea or vomiting.  30 tablet  1  . OxyCODONE (OXYCONTIN) 10 mg T12A 12 hr tablet Take 1 tablet (10 mg total) by mouth 2 (two) times daily.  60 tablet  0  . tamoxifen (NOLVADEX) 20 MG tablet Take 20 mg by mouth 2 (two) times daily.        [name] topiramate (TOPAMAX) 100 MG tablet Take 0.5 tablets (50 mg total) by mouth 2 (two) times daily.  60 tablet  11  . triamcinolone (KENALOG) 0.1 % ointment Apply 1 application topically 2 (two) times daily as needed (itching).       . Vortioxetine HBr (BRINTELLIX) 20 MG TABS Take 10 mg by mouth daily.  30 tablet  5  . zolmitriptan (ZOMIG) 5 MG tablet TAKE 1 TABLET BY MOUTH AS NEEDED FOR MIGRAINE  8 tablet  5  . loperamide (IMODIUM A-D) 2 MG tablet Take 1 tablet (2 mg total) by mouth 4 (four) times daily as needed for diarrhea or loose stools.  30 tablet  0   Facility-Administered Medications Prior to Visit  Medication Dose Route Frequency Provider Last Rate Last Dose  . 0.9 %  sodium chloride infusion  500 mL Intravenous Continuous Inda Castle, MD      . promethazine (PHENERGAN) injection 50 mg  50 mg Intramuscular Q6H PRN Lew Dawes V, MD   50 mg at 03/20/13 1601   Allergies  Allergen Reactions  . Milnacipran Swelling    Headache and hand swelling   . Rizatriptan Benzoate Nausea And Vomiting  . Gabapentin Other (See Comments)    Weak muscles  . Moxifloxacin Other (See Comments)    Unknown  . Sulfonamide Derivatives Rash  . Venlafaxine Other (See Comments)    Unknown   Patient Active Problem List   Diagnosis Date Noted  .  Convulsions/seizures 10/12/2013  . 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 Abnormality of gait 10/12/2013  . Common bile duct (CBD) stricture 10/05/2013  . Nonspecific elevation of levels of transaminase or lactic acid dehydrogenase (LDH) 10/05/2013  . Protein-calorie malnutrition, severe 10/02/2013  . Dilated bile duct 10/02/2013  . Mental status change 10/01/2013  . Dehydration 10/01/2013  . Productive cough 10/01/2013  . Toxic metabolic encephalopathy 19/62/2297  . Nausea with vomiting 09/10/2013  . UTI (urinary tract infection) 09/10/2013  . Acute bronchitis 07/17/2013  . Type I (juvenile type) diabetes mellitus with neurological manifestations, not stated as uncontrolled 07/02/2013  . Unspecified constipation 05/15/2013  . Arm wound 08/16/2011  . Opioid dependence 07/17/2011  . Migraine syndrome 07/16/2011  .  Breast lump in female 07/16/2011  . Bursitis of left hip 06/05/2011  . Knee pain, left 06/05/2011  . Hypoglycemia 04/24/2011  . Chest wall contusion 03/23/2011  . Blood in stool 10/02/2010  . Wart viral 09/29/2010  . Edema 09/01/2010  . Falls frequently 09/01/2010  . Dizzy spells 09/01/2010  . Barrett's esophagus 08/14/2010  . Personal history of colonic polyps 08/14/2010  . GERD (gastroesophageal reflux disease) 07/18/2010  . HYPERTRICHOSIS 05/19/2010  . MENTAL CONFUSION 11/29/2009  . SCAR 08/29/2009  . TRAUMAT HEMOTHOR W/O MENTION OPEN WOUND IN [facility] 02/16/2009  . CHEST PAIN 02/14/2009  . CLOSED FRACTURE OF MULTIPLE RIBS UNSPECIFIED 02/14/2009  . PNEUMOTHORAX, TRAUMATIC 02/14/2009  . FRACTURE, STERNUM 12/02/2008  . SOMNOLENCE 07/23/2008  . FOOT ULCER 05/25/2008  . OSTEOPOROSIS 04/19/2008  . FRACTURE, RIB, LEFT 04/19/2008  . HYPOKALEMIA 04/13/2008  . CHEST WALL PAIN 04/13/2008  . THRUSH 01/22/2008  . TOBACCO USE DISORDER/SMOKER-SMOKING CESSATION DISCUSSED 09/25/2007  . ANEMIA-NOS 05/09/2007  . ANXIETY 05/09/2007  . COPD 05/09/2007  . Headache(784.0) 05/06/2007  .  UNS ADVRS EFF UNS RX MEDICINAL&BIOLOGICAL SBSTNC 05/06/2007  . SINUSITIS, ACUTE 04/11/2007  . DISORDER, ADRENAL NEC 12/11/2006  . VITAMIN D DEFICIENCY 12/11/2006  . MIGRAINE VARIANTS, W/INTRACTABLE MIGRAINE 12/11/2006  . PANCREATITIS, CHRONIC 12/11/2006  . FIBROMYALGIA 12/11/2006  . SYNCOPE 12/11/2006  . FATIGUE 12/11/2006  . DIABETES MELLITUS, TYPE I 08/07/2006  . DEPRESSION 08/07/2006  . HYPERTENSION 08/07/2006  . Personal history of malignant neoplasm of breast 12/10/2005     History  Substance Use Topics  . Smoking status: Current Every Day Smoker -- 1.00 packs/day for 40 years    Types: Cigarettes  . Smokeless tobacco: Never Used  . Alcohol Use: No   family history includes Alzheimer's disease in her sister; COPD in her father; Dementia in her sister; Diabetes in her maternal uncle; Heart attack in her brother and brother; Heart disease in her father; Leukemia in her mother. There is no history of Colon cancer or Colon polyps.     Objective:   Physical Exam  well-developed thin frail-appearing chronically ill appearing 68 year old Lindsey Gomez female in no acute distress. She appears older than her stated age blood pressure 120/86 pulse 72 height 5 foot 1 weight 93 pounds. HEENT; nontraumatic normocephalic EOMI PERRLA sclera anicteric, Supple; no JVD, Cardiovascular; regular rate and rhythm with S1-S2 no murmur or gallop, Pulmonary; clear bilaterally, Abdomen; soft basically nontender there is no palpable mass or hepatosplenomegaly bowel sounds are present she does have an upper abdominal incisional scar, Rectal; exam not done, Extremities; no clubbing cyanosis or edema skin warm and dry, Psych; mood and affect appropriate        Assessment & Plan:  #75  68 year old female with new finding of common bile duct stricture distal status post ERCP and stent placement 10/05/2013. Cytologies from bile duct brushings showed atypia probably reactive. She does have a mildly elevated CEA 19  9. She may have a benign bile duct stricture post partial pancreatectomy, some concern remains for underlying malignancy. #2 diabetes mellitus #3 recent mental status changes and memory loss workup in progress per neurology #4 chronic opioid use #5 prior history of breast cancer  Plan; patient and caregiver advised to watch for signs of stent occlusion i.e. abdominal pain nausea vomiting fever and chills and to call for by should she develop any of these problems Repeat labs today  We'll schedule  office appointment with Dr. Deatra Ina in 4-6 weeks and at that time will need to be  scheduled for ERCP with stent exchange probably with placement of a metal stent. She may need repeat brushings done at that time. Will discuss case with Dr. Deatra Ina

## 2013-11-05 NOTE — Patient Instructions (Signed)
Please go to the basement level to have your labs drawn.  We made you an appointment with Dr Deatra Ina for 12-30-2013 at 9:00 am .

## 2013-11-06 ENCOUNTER — Ambulatory Visit: Payer: Medicare Other | Admitting: Physician Assistant

## 2013-11-06 ENCOUNTER — Telehealth: Payer: Self-pay | Admitting: *Deleted

## 2013-11-06 NOTE — Telephone Encounter (Signed)
Left msg on triage stating she is needing a PA  Both zomig & replex...Lindsey Gomez

## 2013-11-08 NOTE — Progress Notes (Signed)
Pt should have spyglass (cholangioscopy) with next ERCP

## 2013-11-10 ENCOUNTER — Telehealth: Payer: Self-pay | Admitting: Neurology

## 2013-11-10 MED ORDER — ZOLMITRIPTAN 5 MG PO TABS: 5.0000 mg | ORAL_TABLET | Freq: Two times a day (BID) | ORAL | Status: DC | PRN

## 2013-11-10 NOTE — Telephone Encounter (Signed)
Left message for Lindsey Gomez to leave the name of medication and concerns so that we may direct question to Dr.Willis.

## 2013-11-10 NOTE — Telephone Encounter (Signed)
Patient needs refill on Zomig 5 mg and Relpax 40 mg. She is having a migraine and does not have anymore refills. [facility].

## 2013-11-10 NOTE — Telephone Encounter (Signed)
[name]/caregiver calling with question about medication please call dg

## 2013-11-10 NOTE — Telephone Encounter (Signed)
I called patient. The Zomig and the Relpax are not meant to be used together. I will call in the Zomig only.

## 2013-11-10 NOTE — Addendum Note (Signed)
Addended by: Margette Fast on: 11/10/2013 04:41 PM   Modules accepted: Orders

## 2013-11-11 ENCOUNTER — Telehealth: Payer: Self-pay | Admitting: *Deleted

## 2013-11-11 NOTE — Telephone Encounter (Signed)
Zomig 5 mg and Relpax 40 mg PA is approved until 11/11/2014. Pharmacy informed. Pt will be notified by insurance.

## 2013-11-12 ENCOUNTER — Other Ambulatory Visit: Payer: Self-pay | Admitting: Neurology

## 2013-11-12 ENCOUNTER — Ambulatory Visit: Payer: Medicare Other | Admitting: Internal Medicine

## 2013-11-12 MED ORDER — OXYCODONE HCL ER 10 MG PO T12A: 10.0000 mg | EXTENDED_RELEASE_TABLET | Freq: Two times a day (BID) | ORAL | Status: DC

## 2013-11-12 NOTE — Telephone Encounter (Signed)
Both PAs were completed and are approved. Pharmacy was informed.

## 2013-11-16 ENCOUNTER — Telehealth: Payer: Self-pay

## 2013-11-16 NOTE — Telephone Encounter (Signed)
[facility] notified us they have approved our request for coverage on Zomig effective until 11/11/2014 Ref # [id number]

## 2013-12-03 ENCOUNTER — Telehealth: Payer: Self-pay | Admitting: Neurology

## 2013-12-03 MED ORDER — ISOMETHEPTENE-APAP-DICHLORAL 65-325-100 MG PO CAPS: 1.0000 | ORAL_CAPSULE | Freq: Four times a day (QID) | ORAL | Status: DC | PRN

## 2013-12-03 NOTE — Telephone Encounter (Signed)
I called the daughter. The patient wants Ultram in addition to the OxyContin that she is on. I am not sure this is a viable option. The patient is taking a lot of Zomig, she wants to take this medication every day, but I cautioned against this as this may result in rebound headache problems. The patient will be given a prescription for Midrin to see if this is helpful, and can be alternated with the Zomig. The patient will be having Botox injections in October.

## 2013-12-03 NOTE — Telephone Encounter (Signed)
Patient's daughter Ivin Booty calling to state that patient's Zomig isn't working and she would like to try something else, please return call and advise.

## 2013-12-03 NOTE — Telephone Encounter (Signed)
Dr Willis- please advise 

## 2013-12-09 ENCOUNTER — Telehealth: Payer: Self-pay | Admitting: Neurology

## 2013-12-09 NOTE — Telephone Encounter (Signed)
Patient's caregiver [name] @ [phone], requesting Rx refill for OxyCODONE (OXYCONTIN) 10 mg T12A 12 hr tablet.  Patient only has five days left, aware its early, but call when its ready for pick up and may leave detailed message if not available.

## 2013-12-09 NOTE — Telephone Encounter (Signed)
Will forward to provider  

## 2013-12-10 ENCOUNTER — Other Ambulatory Visit: Payer: Self-pay

## 2013-12-10 MED ORDER — OXYCODONE HCL ER 10 MG PO T12A: 10.0000 mg | EXTENDED_RELEASE_TABLET | Freq: Two times a day (BID) | ORAL | Status: DC

## 2013-12-10 NOTE — Telephone Encounter (Signed)
Called pt to inform her that her Rx was ready to be picked up at the front desk and if she has any other problems, questions or concerns to call the office. Pt verbalized understanding. °

## 2013-12-23 ENCOUNTER — Ambulatory Visit (INDEPENDENT_AMBULATORY_CARE_PROVIDER_SITE_OTHER): Payer: Medicare Other | Admitting: Neurology

## 2013-12-23 ENCOUNTER — Encounter: Payer: Self-pay | Admitting: Neurology

## 2013-12-23 DIAGNOSIS — R413 Other amnesia: Secondary | ICD-10-CM

## 2013-12-23 DIAGNOSIS — G43119 Migraine with aura, intractable, without status migrainosus: Secondary | ICD-10-CM

## 2013-12-23 DIAGNOSIS — G43819 Other migraine, intractable, without status migrainosus: Secondary | ICD-10-CM

## 2013-12-23 DIAGNOSIS — R269 Unspecified abnormalities of gait and mobility: Secondary | ICD-10-CM

## 2013-12-23 DIAGNOSIS — G43719 Chronic migraine without aura, intractable, without status migrainosus: Secondary | ICD-10-CM

## 2013-12-23 MED ORDER — ONABOTULINUMTOXINA 100 UNITS IJ SOLR
200.0000 [IU] | Freq: Once | INTRAMUSCULAR | Status: AC
  Administered 2013-12-23: 200 [IU] via INTRAMUSCULAR

## 2013-12-23 NOTE — Progress Notes (Signed)
Reason for visit: Seizure  Lindsey Gomez is a 68 y.o. female  History of present illness:  Lindsey Gomez is a 68 year old right-handed [place] female, accompanied by her caregiver [name] referred by Dr. Jannifer Franklin for Botox injection for chronic migraine headaches, this is her initial visit with me, December 23 7617  She has complicated past medical history, including diabetes, not insulin-dependent, hypertension, fibromyalgia, right breast cancer, COPD, opiate dependence, memory trouble, gait difficulty, chronic pancreatitis, she was admitted to the hospital in July 2015, had seizure, was thought due to benzodiazepine withdrawal  She reported a long-standing history of migraines, had a history of migraine since age 89, getting worse over the past 2 years, to a daily basis, she also had the habit of taking multiple dose of Zomig daily for a while, now she is no longer taking it, 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 Jannifer Franklin in [place] 2015, she was found to have mild parkinsonian features,  She is also taking Topamax 100 mg half tablets twice a day as migraine prevention, reported excessive weight loss recently   Past Medical History  Diagnosis Date  . Depression   . Type II or unspecified type diabetes mellitus without mention of complication, not stated as uncontrolled   . HTN (hypertension)   . Chronic pain   . Fibromyalgia   . Migraines   . History of breast cancer     Right  . Chronic pancreatitis   . Vitamin D deficiency   . GERD (gastroesophageal reflux disease)   . Finger dislocation 2009    L 3rd  . COPD (chronic obstructive pulmonary  disease)   . Anemia   . Fractured sternum 11/2008  . Osteoporosis     Reclast too expensive  . Barrett's esophagus   . Chronic fatigue   . Diverticulosis   . Opioid dependence   . Cancer     HX OF BREAST CANCER   . Convulsions/seizures 10/12/2013    Possible benzodiazepine withdrawal seizure  . 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 Abnormality of gait 10/12/2013  . Anxiety   . Depression   . Diabetes     Past Surgical History  Procedure Laterality Date  . Nasal sinus surgery    . Pancreatectomy      40%  . Cholecystectomy    . Appendectomy    . Abdominal hysterectomy    . Lobectomy Left 2005    granulomatous lesion.   . Breast lumpectomy Left   . Ercp N/A 10/05/2013    Procedure: ENDOSCOPIC RETROGRADE CHOLANGIOPANCREATOGRAPHY (ERCP);  Surgeon: Ladene Artist, MD;  Location: [facility];  Service: Endoscopy;  Laterality: N/A;  . Cataract extraction Bilateral     Family History  Problem Relation Age of Onset  . Heart attack Brother   . COPD Father   . Heart disease Father   . Leukemia Mother   . Diabetes Maternal Uncle   . Dementia Sister   . Alzheimer's disease Sister   . Heart attack Brother   . Colon cancer Neg Hx   . Colon polyps Neg Hx     Social history:  reports that she  has been smoking Cigarettes.  She has a 40 pack-year smoking history. She has never used smokeless tobacco. She reports that she does not drink alcohol or use illicit drugs.  Medications:  Current Outpatient Prescriptions on File Prior to Visit  Medication Sig Dispense Refill  . ACCU-CHEK AVIVA PLUS test strip 1 each by Other route 5 (five) times daily.       [name] amitriptyline (ELAVIL) 50 MG tablet Take 0.5 tablets (25 mg total) by mouth at bedtime.  30 tablet  1  . atenolol (TENORMIN) 100 MG tablet Take 1 tablet (100 mg total) by mouth every morning.  30 tablet  11  . atorvastatin (LIPITOR) 10 MG tablet Take 1 tablet (10 mg total) by mouth daily.  30 tablet  11  . calcitRIOL (ROCALTROL) 0.5 MCG  capsule Take 2 capsules (1 mcg total) by mouth daily.  60 capsule  11  . docusate sodium (COLACE) 100 MG capsule Take 100 mg by mouth daily as needed for mild constipation.      [name] glucagon (GLUCAGON EMERGENCY) 1 MG injection Inject 1 mg into the vein once as needed (for glucose).      . hydrocortisone (CORTEF) 10 MG tablet Take 1 tablet (10 mg total) by mouth 2 (two) times daily.  60 tablet  11  . insulin aspart (NOVOLOG) 100 UNIT/ML FlexPen Inject 4-10 Units into the skin 3 (three) times daily with meals. Inject 10 units subq with breakfast, inject 4 units subq with lunch and inject 5 units with evening meal.      . insulin detemir (LEVEMIR) 100 UNIT/ML injection Inject 8 Units into the skin at bedtime.       [name] isometheptene-acetaminophen-dichloralphenazone (MIDRIN) 65-325-100 MG capsule Take 1 capsule by mouth 4 (four) times daily as needed for migraine. Maximum 5 capsules in 12 hours for migraine headaches, 8 capsules in 24 hours for tension headaches.  40 capsule  1  . LORazepam (ATIVAN) 1 MG tablet Take 1 tablet (1 mg total) by mouth 3 (three) times daily.  60 tablet  3  . mometasone (NASONEX) 50 MCG/ACT nasal spray Place 2 sprays into both nostrils daily.      [name] omeprazole (PRILOSEC) 40 MG capsule Take 1 capsule (40 mg total) by mouth 2 (two) times daily.  60 capsule  11  . ondansetron (ZOFRAN) 4 MG tablet Take 1 tablet (4 mg total) by mouth every 8 (eight) hours as needed for nausea or vomiting.  30 tablet  1  . OxyCODONE (OXYCONTIN) 10 mg T12A 12 hr tablet Take 1 tablet (10 mg total) by mouth 2 (two) times daily.  60 tablet  0  . tamoxifen (NOLVADEX) 20 MG tablet Take 20 mg by mouth 2 (two) times daily.        [name] topiramate (TOPAMAX) 100 MG tablet Take 0.5 tablets (50 mg total) by mouth 2 (two) times daily.  60 tablet  11  . triamcinolone (KENALOG) 0.1 % ointment Apply 1 application topically 2 (two) times daily as needed (itching).       . Vortioxetine HBr (BRINTELLIX) 20 MG TABS Take 10 mg by  mouth daily.  30 tablet  5  . zolmitriptan (ZOMIG) 5 MG tablet Take 1 tablet (5 mg total) by mouth 2 (two) times daily as needed for migraine.  10 tablet  5   Current Facility-Administered Medications on File Prior to Visit  Medication Dose Route Frequency Provider Last Rate Last Dose  . 0.9 %  sodium chloride infusion  500 mL Intravenous Continuous Inda Castle, MD      . promethazine (PHENERGAN) injection 50 mg  50 mg Intramuscular Q6H PRN Lew Dawes V, MD   50 mg at 03/20/13 1601      Allergies  Allergen Reactions  . Milnacipran Swelling    Headache and hand swelling   . Rizatriptan Benzoate Nausea And Vomiting  . Gabapentin Other (See Comments)    Weak muscles  . Moxifloxacin Other (See Comments)    Unknown  . Sulfonamide Derivatives Rash  . Venlafaxine Other (See Comments)    Unknown    ROS:  Out of a complete 14 system review of symptoms, the patient complains only of the following symptoms, and all other reviewed systems are negative. As above  Blood pressure , pulse , height 0' (0 m), weight 0 lb (0 kg).  Physical Exam  PHYSICAL EXAMINATOINS:  Generalized: In no acute distress  Neck: Supple, no carotid bruits   Cardiac: Regular rate rhythm  Pulmonary: Clear to auscultation bilaterally  Musculoskeletal: No deformity  Neurological examination  Mentation: Alert oriented to time, place, history taking, and causual conversation  Cranial nerve II-XII: Pupils were equal round reactive to light extraocular movements were full, visual field were full on confrontational test.  Bilateral fundi were sharp  l mild right lower face weakness,Uvula tongue midline.  head turning and shoulder shrug and were normal and symmetric.Tongue protrusion into cheek strength was normal.  Motor: She has mild bilateral hip flexion weakness, mild to moderate right more than left rigidity, bradykinesia,  Coordination: Normal finger to nose, heel-to-shin bilaterally there was no  truncal ataxia  Gait: Need to push up to get up from seated position, narrow based, unsteady,enblock turning  Deep tendon reflexes: hypoactive and symmetric  Assessment/Plan: 68 year old female, with complicated past medical history, including diabetes, insulin-dependent, chronic pancreatitis, fibromyalgia, chronic migraine, likely a component of medicine rebound headaches, now complains of chronic daily headaches, frequent moderate to severe migraine headaches, on polypharmacy treatment,  She is here Botox injection as migraine prevention. Potential side effect explained, consent form was signed BOTOX injection was performed according to protocol by [employer]. 100 units of BOTOX was dissolved into 2 cc NS   Total of 155 units, discard 45 units.   Corrugator 2 sites, 10 units Procerus 1 site, 5 unit Frontalis 4 sites,  20 units, Temporalis 8 sites,  40 units  Occipitalis 6 sites, 30 units Cervical Paraspinal, 4 sites, 20 units Trapezius, 6 sites, 30 units  Patient tolerate the injection well. Will return for repeat injection in 3 months.

## 2013-12-30 ENCOUNTER — Ambulatory Visit (INDEPENDENT_AMBULATORY_CARE_PROVIDER_SITE_OTHER): Payer: Medicare Other | Admitting: Gastroenterology

## 2013-12-30 ENCOUNTER — Encounter: Payer: Self-pay | Admitting: Gastroenterology

## 2013-12-30 ENCOUNTER — Other Ambulatory Visit (INDEPENDENT_AMBULATORY_CARE_PROVIDER_SITE_OTHER): Payer: Medicare Other

## 2013-12-30 VITALS — BP 138/82 | HR 72 | Ht 61.0 in | Wt 89.4 lb

## 2013-12-30 DIAGNOSIS — K2270 Barrett's esophagus without dysplasia: Secondary | ICD-10-CM

## 2013-12-30 DIAGNOSIS — K831 Obstruction of bile duct: Secondary | ICD-10-CM

## 2013-12-30 DIAGNOSIS — K858 Other acute pancreatitis without necrosis or infection: Secondary | ICD-10-CM

## 2013-12-30 DIAGNOSIS — K839 Disease of biliary tract, unspecified: Secondary | ICD-10-CM

## 2013-12-30 DIAGNOSIS — Z853 Personal history of malignant neoplasm of breast: Secondary | ICD-10-CM

## 2013-12-30 DIAGNOSIS — K648 Other hemorrhoids: Secondary | ICD-10-CM

## 2013-12-30 DIAGNOSIS — K861 Other chronic pancreatitis: Secondary | ICD-10-CM

## 2013-12-30 LAB — COMPREHENSIVE METABOLIC PANEL WITH GFR
ALT: 23 U/L (ref 0–35)
AST: 22 U/L (ref 0–37)
Albumin: 3.4 g/dL — ABNORMAL LOW (ref 3.5–5.2)
Alkaline Phosphatase: 82 U/L (ref 39–117)
BUN: 14 mg/dL (ref 6–23)
CO2: 30 meq/L (ref 19–32)
Calcium: 9.9 mg/dL (ref 8.4–10.5)
Chloride: 103 meq/L (ref 96–112)
Creatinine, Ser: 1.1 mg/dL (ref 0.4–1.2)
GFR: 53.27 mL/min — ABNORMAL LOW
Glucose, Bld: 120 mg/dL — ABNORMAL HIGH (ref 70–99)
Potassium: 3.2 meq/L — ABNORMAL LOW (ref 3.5–5.1)
Sodium: 137 meq/L (ref 135–145)
Total Bilirubin: 0.5 mg/dL (ref 0.2–1.2)
Total Protein: 6.9 g/dL (ref 6.0–8.3)

## 2013-12-30 NOTE — Progress Notes (Signed)
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  Cytology demonstrated atypia.  She has a history of breast cancer and is status post partial pancreatectomy for a pseudocyst.  Patient has no GI complaints except for the last 3 days where she has had nausea, vomiting and diarrhea.  She complains of myalgias as well.  Up to then she had been feeling perfectly well.  He complains of frequent hemorrhoidal discomfort despite taking various topicals.   Past Medical History  Diagnosis Date  . Depression   . Type II or unspecified type diabetes mellitus without mention of complication, not stated as uncontrolled   . HTN (hypertension)   . Chronic pain   . Fibromyalgia   . Migraines   . History of breast cancer     Right  . Chronic pancreatitis   . Vitamin D deficiency   . GERD (gastroesophageal reflux disease)   . Finger dislocation 2009    L 3rd  . COPD (chronic obstructive pulmonary disease)   . Anemia   . Fractured sternum 11/2008  . Osteoporosis     Reclast too expensive  . Barrett's esophagus   . Chronic fatigue   . Diverticulosis   . Opioid dependence   . Cancer     HX OF BREAST CANCER   . Convulsions/seizures 10/12/2013    Possible benzodiazepine withdrawal seizure  . 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 Abnormality of gait 10/12/2013  . Anxiety   . Depression   . Diabetes    Past Surgical History  Procedure Laterality Date  . Nasal sinus surgery    . Pancreatectomy      40%  . Cholecystectomy    . Appendectomy    . Abdominal hysterectomy    . Lobectomy Left 2005    granulomatous lesion.   . Breast lumpectomy Left   . Ercp N/A 10/05/2013    Procedure: ENDOSCOPIC RETROGRADE CHOLANGIOPANCREATOGRAPHY (ERCP);  Surgeon: Ladene Artist, MD;  Location: [facility] ENDOSCOPY;  Service: Endoscopy;  Laterality: N/A;  . Cataract extraction Bilateral    family history includes Alzheimer's disease in her sister; COPD in her father; Dementia in her sister; Diabetes in her maternal uncle; Heart attack in her brother and brother; Heart disease in her father; Leukemia in her mother. There is no history of Colon cancer or Colon polyps. Current Outpatient Prescriptions  Medication Sig Dispense Refill  . ACCU-CHEK AVIVA PLUS test strip 1 each by Other route 5 (five) times daily.       [name] atenolol (TENORMIN) 100 MG tablet Take 1 tablet (100 mg total) by mouth every morning.  30 tablet  11  . atorvastatin (LIPITOR) 10 MG tablet Take 1 tablet (10 mg total) by  mouth daily.  30 tablet  11  . B-D ULTRAFINE III SHORT PEN 31G X 8 MM MISC       . calcitRIOL (ROCALTROL) 0.5 MCG capsule Take 2 capsules (1 mcg total) by mouth daily.  60 capsule  11  . docusate sodium (COLACE) 100 MG capsule Take 100 mg by mouth daily as needed for mild constipation.      [name] glucagon (GLUCAGON EMERGENCY) 1 MG injection Inject 1 mg into the vein once as needed (for glucose).      . hydrocortisone (CORTEF) 10 MG tablet Take 1 tablet (10 mg total) by mouth 2 (two) times daily.  60 tablet  11  . insulin aspart (NOVOLOG) 100 UNIT/ML FlexPen Inject 4-10 Units into the skin 3 (three) times daily with meals. Inject 10 units subq with breakfast, inject 4 units subq with lunch and inject 5 units with evening meal.      . insulin detemir (LEVEMIR) 100 UNIT/ML injection Inject 8 Units into the skin at bedtime.       [name] isometheptene-acetaminophen-dichloralphenazone (MIDRIN) 65-325-100 MG capsule Take 1 capsule by mouth 4 (four) times daily as needed for migraine. Maximum 5 capsules in 12 hours for migraine headaches, 8 capsules in 24 hours for tension headaches.  40 capsule  1  . loperamide (IMODIUM) 2 MG capsule 2 mg daily.      [name] LORazepam (ATIVAN) 1 MG tablet Take 1 tablet (1 mg total)  by mouth 3 (three) times daily.  60 tablet  3  . mometasone (NASONEX) 50 MCG/ACT nasal spray Place 2 sprays into both nostrils daily.      [name] omeprazole (PRILOSEC) 40 MG capsule Take 1 capsule (40 mg total) by mouth 2 (two) times daily.  60 capsule  11  . ondansetron (ZOFRAN) 4 MG tablet Take 1 tablet (4 mg total) by mouth every 8 (eight) hours as needed for nausea or vomiting.  30 tablet  1  . OxyCODONE (OXYCONTIN) 10 mg T12A 12 hr tablet Take 1 tablet (10 mg total) by mouth 2 (two) times daily.  60 tablet  0  . prochlorperazine (COMPAZINE) 10 MG tablet 10 mg daily.      [name] topiramate (TOPAMAX) 100 MG tablet Take 0.5 tablets (50 mg total) by mouth 2 (two) times daily.  60 tablet  11  . triamcinolone (KENALOG) 0.1 % ointment Apply 1 application topically 2 (two) times daily as needed (itching).       . Vortioxetine HBr (BRINTELLIX) 20 MG TABS Take 10 mg by mouth daily.  30 tablet  5  . zolmitriptan (ZOMIG) 5 MG tablet Take 1 tablet (5 mg total) by mouth 2 (two) times daily as needed for migraine.  10 tablet  5   Current Facility-Administered Medications  Medication Dose Route Frequency Provider Last Rate Last Dose  . 0.9 %  sodium chloride infusion  500 mL Intravenous Continuous Inda Castle, MD      . promethazine (PHENERGAN) injection 50 mg  50 mg Intramuscular Q6H PRN Lew Dawes V, MD   50 mg at 03/20/13 1601   Allergies as of 12/30/2013 - Review Complete 12/30/2013  Allergen Reaction Noted  . Milnacipran Swelling 05/25/2008  . Rizatriptan benzoate Nausea And Vomiting 10/02/2013  . Gabapentin Other (See Comments) 06/04/2012  . Moxifloxacin Other (See Comments) 12/11/2006  . Sulfonamide derivatives Rash 12/11/2006  . Venlafaxine Other (See Comments) 05/09/2007    reports that she has been smoking Cigarettes.  She has a 40 pack-year smoking history.  She has never used smokeless tobacco. She reports that she does not drink alcohol or use illicit drugs.   Review of Systems:  Pertinent positive and negative review of systems were noted in the above HPI section. All other review of systems were otherwise negative.  Vital signs were reviewed in today's medical record Physical Exam: General: Thin female in no acute distress Skin: anicteric Head: Normocephalic and atraumatic Eyes:  sclerae anicteric, EOMI Ears: Normal auditory acuity Mouth: No deformity or lesions Neck: Supple, no masses or thyromegaly Lungs: Clear throughout to auscultation Heart: Regular rate and rhythm; no murmurs, rubs or bruits Abdomen: Soft, non tender and non distended. No masses, hepatosplenomegaly or hernias noted. Normal Bowel sounds Rectal: Grade 3 hemorrhoids Musculoskeletal: Symmetrical with no gross deformities  Skin: No lesions on visible extremities Pulses:  Normal pulses noted Extremities: No clubbing, cyanosis, edema or deformities noted Neurological: Alert oriented x 4, grossly nonfocal Cervical Nodes:  No significant cervical adenopathy Inguinal Nodes: No significant inguinal adenopathy Psychological:  Alert and cooperative. Normal mood and affect  See Assessment and Plan under Problem List

## 2013-12-30 NOTE — Addendum Note (Signed)
Addended by: Oda Kilts on: 12/30/2013 09:50 AM   Modules accepted: Orders

## 2013-12-30 NOTE — Patient Instructions (Addendum)
You have been scheduled for a CT scan of the abdomen and pelvis at [facility] ([address]---this is in the same building as Press [facility]).   You are scheduled on 01/01/2014 at 9:30AM. You should arrive 15 minutes prior to your appointment time for registration. Please follow the written instructions below on the day of your exam:  WARNING: IF YOU ARE ALLERGIC TO IODINE/X-RAY DYE, PLEASE NOTIFY RADIOLOGY IMMEDIATELY AT [phone]! YOU WILL BE GIVEN A 13 HOUR PREMEDICATION PREP.  1) Do not eat or drink anything after 5:30AM (4 hours prior to your test) 2) You have been given 2 bottles of oral contrast to drink. The solution may taste               better if refrigerated, but do NOT add ice or any other liquid to this solution. Shake             well before drinking.    Drink 1 bottle of contrast @ 7:30AM (2 hours prior to your exam)  Drink 1 bottle of contrast @ 8:30AM (1 hour prior to your exam)  You may take any medications as prescribed with a small amount of water except for the following: Metformin, Glucophage, Glucovance, Avandamet, Riomet, Fortamet, Actoplus Met, Janumet, Glumetza or Metaglip. The above medications must be held the day of the exam AND 48 hours after the exam.  The purpose of you drinking the oral contrast is to aid in the visualization of your intestinal tract. The contrast solution may cause some diarrhea. Before your exam is started, you will be given a small amount of fluid to drink. Depending on your individual set of symptoms, you may also receive an intravenous injection of x-ray contrast/dye. Plan on being at [facility] for 30 minutes or long, depending on the type of exam you are having performed.  This test typically takes 30-45 minutes to complete.  If you have any questions regarding your exam or if you need to reschedule, you may call the CT department at [phone] between the hours of 8:00 am and 5:00 pm,  Monday-Friday.  ________________________________________________________________________   Dennis Bast have been scheduled for your ERCP at [place] instructions have been given  Go to the basement for labs today  Use Imodium As needed Use Destine Ointment OTC as needed  Your Hemorrhoidal banding is scheduled on 01/21/2014 at 10:15am

## 2013-12-30 NOTE — Assessment & Plan Note (Signed)
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  Note patient does have a history of breast cancer.  Recommendations #1 followup CT scan #2 repeat CA 19-9 9 #3 ERCP with cholangioscopy, removal of stent

## 2013-12-30 NOTE — Assessment & Plan Note (Signed)
Patient is symptomatically hemorrhoids despite medical therapy  Recommendations #1 band ligation

## 2013-12-30 NOTE — Assessment & Plan Note (Signed)
Last endoscopy 2012 demonstrated Barrett's esophagus.  Patient is due for followup.  Will defer until ERCP is completed

## 2013-12-30 NOTE — Assessment & Plan Note (Signed)
Recent nausea, vomiting diarrhea, arthralgias are probably due to a gastroenteritis

## 2013-12-31 ENCOUNTER — Emergency Department (HOSPITAL_COMMUNITY): Payer: Medicare Other

## 2013-12-31 ENCOUNTER — Emergency Department (HOSPITAL_COMMUNITY)
Admission: EM | Admit: 2013-12-31 | Discharge: 2013-12-31 | Disposition: A | Discharge status: Home / Self Care | Payer: Medicare Other | Attending: Emergency Medicine | Admitting: Emergency Medicine

## 2013-12-31 ENCOUNTER — Telehealth: Payer: Self-pay | Admitting: Gastroenterology

## 2013-12-31 ENCOUNTER — Encounter (HOSPITAL_COMMUNITY): Payer: Self-pay | Admitting: Emergency Medicine

## 2013-12-31 ENCOUNTER — Encounter (HOSPITAL_COMMUNITY): Payer: Self-pay | Admitting: Pharmacy Technician

## 2013-12-31 DIAGNOSIS — G8929 Other chronic pain: Secondary | ICD-10-CM | POA: Diagnosis not present

## 2013-12-31 DIAGNOSIS — Z720 Tobacco use: Secondary | ICD-10-CM | POA: Insufficient documentation

## 2013-12-31 DIAGNOSIS — R112 Nausea with vomiting, unspecified: Secondary | ICD-10-CM | POA: Insufficient documentation

## 2013-12-31 DIAGNOSIS — Z7952 Long term (current) use of systemic steroids: Secondary | ICD-10-CM | POA: Diagnosis not present

## 2013-12-31 DIAGNOSIS — K219 Gastro-esophageal reflux disease without esophagitis: Secondary | ICD-10-CM | POA: Insufficient documentation

## 2013-12-31 DIAGNOSIS — R197 Diarrhea, unspecified: Secondary | ICD-10-CM | POA: Diagnosis present

## 2013-12-31 DIAGNOSIS — Z79899 Other long term (current) drug therapy: Secondary | ICD-10-CM | POA: Diagnosis not present

## 2013-12-31 DIAGNOSIS — Z862 Personal history of diseases of the blood and blood-forming organs and certain disorders involving the immune mechanism: Secondary | ICD-10-CM | POA: Diagnosis not present

## 2013-12-31 DIAGNOSIS — E119 Type 2 diabetes mellitus without complications: Secondary | ICD-10-CM | POA: Diagnosis not present

## 2013-12-31 DIAGNOSIS — M797 Fibromyalgia: Secondary | ICD-10-CM | POA: Insufficient documentation

## 2013-12-31 DIAGNOSIS — J449 Chronic obstructive pulmonary disease, unspecified: Secondary | ICD-10-CM | POA: Diagnosis not present

## 2013-12-31 DIAGNOSIS — R109 Unspecified abdominal pain: Secondary | ICD-10-CM | POA: Diagnosis not present

## 2013-12-31 DIAGNOSIS — G43909 Migraine, unspecified, not intractable, without status migrainosus: Secondary | ICD-10-CM | POA: Diagnosis not present

## 2013-12-31 DIAGNOSIS — I10 Essential (primary) hypertension: Secondary | ICD-10-CM | POA: Insufficient documentation

## 2013-12-31 DIAGNOSIS — G40909 Epilepsy, unspecified, not intractable, without status epilepticus: Secondary | ICD-10-CM | POA: Insufficient documentation

## 2013-12-31 DIAGNOSIS — R918 Other nonspecific abnormal finding of lung field: Secondary | ICD-10-CM | POA: Insufficient documentation

## 2013-12-31 DIAGNOSIS — Z794 Long term (current) use of insulin: Secondary | ICD-10-CM | POA: Insufficient documentation

## 2013-12-31 DIAGNOSIS — Z853 Personal history of malignant neoplasm of breast: Secondary | ICD-10-CM | POA: Insufficient documentation

## 2013-12-31 LAB — COMPREHENSIVE METABOLIC PANEL
ALBUMIN: 3.5 g/dL (ref 3.5–5.2)
ALK PHOS: 85 U/L (ref 39–117)
ALT: 21 U/L (ref 0–35)
ANION GAP: 14 (ref 5–15)
AST: 18 U/L (ref 0–37)
BUN: 13 mg/dL (ref 6–23)
CHLORIDE: 102 meq/L (ref 96–112)
CO2: 23 mEq/L (ref 19–32)
Calcium: 10 mg/dL (ref 8.4–10.5)
Creatinine, Ser: 0.92 mg/dL (ref 0.50–1.10)
GFR calc Af Amer: 74 mL/min — ABNORMAL LOW (ref >90)
GFR calc non Af Amer: 63 mL/min — ABNORMAL LOW (ref >90)
Glucose, Bld: 151 mg/dL — ABNORMAL HIGH (ref 70–99)
POTASSIUM: 3.7 meq/L (ref 3.7–5.3)
Sodium: 139 mEq/L (ref 137–147)
Total Bilirubin: <0.2 mg/dL — ABNORMAL LOW (ref 0.3–1.2)
Total Protein: 6.7 g/dL (ref 6.0–8.3)

## 2013-12-31 LAB — CBC WITH DIFFERENTIAL/PLATELET
BASOS PCT: 0 % (ref 0–1)
Basophils Absolute: 0 10*3/uL (ref 0.0–0.1)
Eosinophils Absolute: 0 10*3/uL (ref 0.0–0.7)
Eosinophils Relative: 0 % (ref 0–5)
HCT: 32.5 % — ABNORMAL LOW (ref 36.0–46.0)
Hemoglobin: 10.8 g/dL — ABNORMAL LOW (ref 12.0–15.0)
LYMPHS ABS: 2.4 10*3/uL (ref 0.7–4.0)
Lymphocytes Relative: 35 % (ref 12–46)
MCH: 28.4 pg (ref 26.0–34.0)
MCHC: 33.2 g/dL (ref 30.0–36.0)
MCV: 85.5 fL (ref 78.0–100.0)
Monocytes Absolute: 0.5 10*3/uL (ref 0.1–1.0)
Monocytes Relative: 8 % (ref 3–12)
NEUTROS ABS: 4 10*3/uL (ref 1.7–7.7)
NEUTROS PCT: 57 % (ref 43–77)
Platelets: 211 10*3/uL (ref 150–400)
RBC: 3.8 MIL/uL — AB (ref 3.87–5.11)
RDW: 15.3 % (ref 11.5–15.5)
WBC: 6.9 10*3/uL (ref 4.0–10.5)

## 2013-12-31 LAB — URINALYSIS, ROUTINE W REFLEX MICROSCOPIC
Bilirubin Urine: NEGATIVE
Glucose, UA: NEGATIVE mg/dL
Hgb urine dipstick: NEGATIVE
Ketones, ur: NEGATIVE mg/dL
Nitrite: NEGATIVE
Protein, ur: NEGATIVE mg/dL
Specific Gravity, Urine: 1.012 (ref 1.005–1.030)
Urobilinogen, UA: 0.2 mg/dL (ref 0.0–1.0)
pH: 7 (ref 5.0–8.0)

## 2013-12-31 LAB — URINE MICROSCOPIC-ADD ON

## 2013-12-31 LAB — CBG MONITORING, ED: Glucose-Capillary: 82 mg/dL (ref 70–99)

## 2013-12-31 LAB — LIPASE, BLOOD: Lipase: 17 U/L (ref 11–59)

## 2013-12-31 MED ORDER — ONDANSETRON HCL 4 MG/2ML IJ SOLN
4.0000 mg | Freq: Once | INTRAMUSCULAR | Status: AC
  Administered 2013-12-31: 4 mg via INTRAVENOUS
  Filled 2013-12-31: qty 2

## 2013-12-31 MED ORDER — SODIUM CHLORIDE 0.9 % IV BOLUS (SEPSIS)
1000.0000 mL | Freq: Once | INTRAVENOUS | Status: AC
  Administered 2013-12-31: 1000 mL via INTRAVENOUS

## 2013-12-31 MED ORDER — ONDANSETRON HCL 4 MG PO TABS: 4.0000 mg | ORAL_TABLET | Freq: Four times a day (QID) | ORAL | Status: DC

## 2013-12-31 MED ORDER — DICYCLOMINE HCL 20 MG PO TABS: 20.0000 mg | ORAL_TABLET | Freq: Two times a day (BID) | ORAL | Status: DC

## 2013-12-31 MED ORDER — IOHEXOL 300 MG/ML  SOLN
100.0000 mL | Freq: Once | INTRAMUSCULAR | Status: AC | PRN
  Administered 2013-12-31: 65 mL via INTRAVENOUS

## 2013-12-31 MED ORDER — IOHEXOL 300 MG/ML  SOLN
25.0000 mL | INTRAMUSCULAR | Status: AC
  Administered 2013-12-31: 25 mL via ORAL

## 2013-12-31 NOTE — ED Notes (Signed)
Pt c/o diarrhea x 5 days and generalized weakness

## 2013-12-31 NOTE — ED Notes (Signed)
Communication with CT pt drank contrast

## 2013-12-31 NOTE — ED Notes (Signed)
Pt ambulated to restroom. Drinking second cup of contrast.

## 2013-12-31 NOTE — ED Provider Notes (Signed)
CSN: [id number]     Arrival date & time 12/31/13  1133 History   First MD Initiated Contact with Patient 12/31/13 1417     Chief Complaint  Patient presents with  . Diarrhea     (Consider location/radiation/quality/duration/timing/severity/associated sxs/prior Treatment) HPI Patient presents to the emergency department with nausea, vomiting, diarrhea, and abdominal pain, that started on "Sunday.  The patient denies previous history of this she is scheduled to see her GI doctor tomorrow.  The patient denies chest pain, shortness breath, weakness, numbness, dizziness, headache, blurred vision, neck pain, back pain, dysuria, fever, rash, lightheadedness, or syncope.  She states she did not take any medications prior to arrival.  The patient, states nothing seems make her condition, better by eating and drinking, make his condition worse Past Medical History  Diagnosis Date  . Depression   . Type II or unspecified type diabetes mellitus without mention of complication, not stated as uncontrolled   . HTN (hypertension)   . Chronic pain   . Fibromyalgia   . Migraines   . History of breast cancer     Right  . Chronic pancreatitis   . Vitamin D deficiency   . GERD (gastroesophageal reflux disease)   . Finger dislocation 2009    L 3rd  . COPD (chronic obstructive pulmonary disease)   . Anemia   . Fractured sternum 11/2008  . Osteoporosis     Reclast too expensive  . Barrett's esophagus   . Chronic fatigue   . Diverticulosis   . Opioid dependence   . Cancer     HX OF BREAST CANCER   . Convulsions/seizures 10/12/2013    Possible benzodiazepine withdrawal seizure  . 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 Abnormality of gait 10/12/2013  . Anxiety   . Depression   . Diabetes    Past Surgical History  Procedure Laterality Date  . Nasal sinus surgery    . Pancreatectomy      40" %  . Cholecystectomy    . Appendectomy    . Abdominal hysterectomy    . Lobectomy Left 2005    granulomatous lesion.    . Breast lumpectomy Left   . Ercp N/A 10/05/2013    Procedure: ENDOSCOPIC RETROGRADE CHOLANGIOPANCREATOGRAPHY (ERCP);  Surgeon: Ladene Artist, MD;  Location: [facility] ENDOSCOPY;  Service: Endoscopy;  Laterality: N/A;  . Cataract extraction Bilateral    Family History  Problem Relation Age of Onset  . Heart attack Brother   . COPD Father   . Heart disease Father   . Leukemia Mother   . Diabetes Maternal Uncle   . Dementia Sister   . Alzheimer's disease Sister   . Heart attack Brother   . Colon cancer Neg Hx   . Colon polyps Neg Hx    History  Substance Use Topics  . Smoking status: Current Every Day Smoker -- 1.00 packs/day for 40 years    Types: Cigarettes  . Smokeless tobacco: Never Used  . Alcohol Use: No   OB History   Grav Para Term Preterm Abortions TAB SAB Ect Mult Living                 Review of Systems  All other systems negative except as documented in the HPI. All pertinent positives and negatives as reviewed in the HPI.   Allergies  Milnacipran; Rizatriptan benzoate; Gabapentin; Moxifloxacin; Sulfonamide derivatives; and Venlafaxine  Home Medications   Prior to Admission medications   Medication Sig Start Date End Date Taking? Authorizing Provider  ACCU-CHEK AVIVA PLUS test strip 1 each by Other route See admin instructions. Check blood sugar 4 times daily. 10/08/13   Historical Provider, MD  atenolol (TENORMIN) 100 MG tablet Take 1 tablet (100 mg total) by mouth every morning. 05/15/13   Aleksei Plotnikov V, MD  atorvastatin (LIPITOR) 10 MG tablet Take 1 tablet (10 mg total) by mouth daily. 05/15/13 05/18/14  Aleksei Plotnikov V, MD  calcitRIOL (ROCALTROL) 0.5 MCG capsule Take 2 capsules (1 mcg total) by mouth daily. 05/15/13   Aleksei Plotnikov V, MD  docusate sodium (COLACE) 100 MG capsule Take 100 mg by mouth daily as needed for mild constipation.    Historical Provider, MD  glucagon (GLUCAGON EMERGENCY) 1 MG injection Inject 1 mg into the vein once as needed (for  glucose).    Historical Provider, MD  hydrocortisone (CORTEF) 10 MG tablet Take 1 tablet (10 mg total) by mouth 2 (two) times daily. 05/15/13   Aleksei Plotnikov V, MD  insulin aspart (NOVOLOG) 100 UNIT/ML FlexPen Inject 0-7 Units into the skin 3 (three) times daily with meals. 70-120 = 0 units, 121-150 = 1 unit, 151-200 = 2 units, 201-250 = 3 units, 251-300 = 5 units, 301-350 = 7 units. 01/13/13   Renato Shin, MD  insulin detemir (LEVEMIR) 100 UNIT/ML injection Inject 8 Units into the skin at bedtime.  05/27/13   Renato Shin, MD  isometheptene-acetaminophen-dichloralphenazone (MIDRIN) [phone] MG capsule Take 1 capsule by mouth 4 (four) times daily as needed for migraine. Maximum 5 capsules in 12 hours for migraine headaches, 8 capsules in 24 hours for tension headaches. 12/03/13   Kathrynn Ducking, MD  loperamide (IMODIUM) 2 MG capsule Take 2-4 mg by mouth as needed for diarrhea or loose stools.  10/07/13   Historical Provider, MD  LORazepam (ATIVAN) 1 MG tablet Take 1 tablet (1 mg total) by mouth 3 (three) times daily. 10/07/13   Thurnell Lose, MD  mometasone (NASONEX) 50 MCG/ACT nasal spray Place 2 sprays into both nostrils daily as needed (for congestion).     Historical Provider, MD  omeprazole (PRILOSEC) 40 MG capsule Take 1 capsule (40 mg total) by mouth 2 (two) times daily. 07/17/13   Aleksei Plotnikov V, MD  OxyCODONE (OXYCONTIN) 10 mg T12A 12 hr tablet Take 1 tablet (10 mg total) by mouth 2 (two) times daily. 12/10/13   Kathrynn Ducking, MD  Polyvinyl Alcohol (LIQUID TEARS OP) Place 1-2 drops into both eyes as needed (for dry eyes).    Historical Provider, MD  topiramate (TOPAMAX) 100 MG tablet Take 100 mg by mouth 2 (two) times daily.    Historical Provider, MD  triamcinolone (KENALOG) 0.1 % ointment Apply 1 application topically 2 (two) times daily as needed (itching).     Historical Provider, MD  Vortioxetine HBr (BRINTELLIX) 20 MG TABS Take 20 mg by mouth daily.    Historical Provider, MD   zolmitriptan (ZOMIG) 5 MG tablet Take 1 tablet (5 mg total) by mouth 2 (two) times daily as needed for migraine. 11/10/13   Kathrynn Ducking, MD   BP 176/92  Pulse 63  Temp(Src) 98.2 F (36.8 C) (Oral)  Resp 20  Ht 5\' 2"  (1.575 m)  Wt 89 lb 6 oz (40.54 kg)  BMI 16.34 kg/m2  SpO2 100% Physical Exam  Nursing note and vitals reviewed. Constitutional: She is oriented to person, place, and time. She appears well-developed and well-nourished.  HENT:  Head: Normocephalic and atraumatic.  Mouth/Throat: Oropharynx is clear and moist.  Eyes: Pupils are equal, round, and reactive to light.  Neck: Normal range of motion. Neck supple.  Cardiovascular: Normal rate, regular rhythm and normal heart sounds.  Exam reveals no gallop and no friction rub.   No murmur heard. Pulmonary/Chest: Effort normal and breath sounds normal. No respiratory distress.  Neurological: She is alert and oriented to person, place, and time. She exhibits normal muscle tone. Coordination normal.  Skin: Skin is warm and dry.    ED Course  Procedures (including critical care time) Labs Review Labs Reviewed  CBC WITH DIFFERENTIAL - Abnormal; Notable for the following:    RBC 3.80 (*)    Hemoglobin 10.8 (*)    HCT 32.5 (*)    All other components within normal limits  COMPREHENSIVE METABOLIC PANEL - Abnormal; Notable for the following:    Glucose, Bld 151 (*)    Total Bilirubin <0.2 (*)    GFR calc non Af Amer 63 (*)    GFR calc Af Amer 74 (*)    All other components within normal limits  URINE CULTURE  LIPASE, BLOOD  URINALYSIS, ROUTINE W REFLEX MICROSCOPIC        Brent General, PA-C 12/31/13 [phone]

## 2013-12-31 NOTE — ED Provider Notes (Signed)
PROGRESS NOTE                                                                                                                 This is a sign-out from PA Lawer at shift change: Lindsey Gomez is a 68 y.o. female presenting with diarrhea, nausea vomiting. Plan is to followup CT, by mouth challenge and reevaluate. Please refer to previous note for full HPI, ROS, PMH and PE.   CT abdomen with no acute abnormalities. Patient has multiple pulmonary nodules. She is tolerating by mouth and amenable to discharge. Advised her to follow closely with primary care for evaluation of nodules.  Filed Vitals:   12/31/13 1421 12/31/13 1539 12/31/13 1704 12/31/13 1900  BP: 176/92 167/91 170/89 161/88  Pulse: 63 60 66 71  Temp:    98.4 F (36.9 C)  TempSrc:    Oral  Resp: 20 15 20 19   Height:      Weight:      SpO2: 100% 100% 100% 98%    Medications  iohexol (OMNIPAQUE) 300 MG/ML solution 25 mL (25 mLs Oral Contrast Given 12/31/13 1517)  sodium chloride 0.9 % bolus 1,000 mL (0 mLs Intravenous Stopped 12/31/13 1703)  ondansetron (ZOFRAN) injection 4 mg (4 mg Intravenous Given 12/31/13 1541)  iohexol (OMNIPAQUE) 300 MG/ML solution 100 mL (65 mLs Intravenous Contrast Given 12/31/13 1805)   Evaluation does not show pathology that would require ongoing emergent intervention or inpatient treatment. Pt is hemodynamically stable and mentating appropriately. Discussed findings and plan with patient/guardian, who agrees with care plan. All questions answered. Return precautions discussed and outpatient follow up given.   Discharge Medication List as of 12/31/2013  7:06 PM    START taking these medications   Details  dicyclomine (BENTYL) 20 MG tablet Take 1 tablet (20 mg total) by mouth 2 (two) times daily., Starting 12/31/2013, Until Discontinued, Print    ondansetron (ZOFRAN) 4 MG tablet Take 1 tablet (4 mg total) by mouth every 6 (six) hours., Starting 12/31/2013, Until Discontinued, [employer], PA-C 01/01/14 [phone]

## 2013-12-31 NOTE — Telephone Encounter (Signed)
Agree with ER evaluation, please notify her primary GI doctor

## 2013-12-31 NOTE — Telephone Encounter (Signed)
FYI message for the doc of the day. Spoke with care giver [name]. The patient now has vomiting and has not been able to keep fluids done. Fasting blood sugar is 232 which is not her normal. She complains of stomach pain. Care giver states she looks sick. She would like to take her to the ER for evaluation and wanted [place] to know she will go to [facility].

## 2013-12-31 NOTE — ED Notes (Signed)
Checked patient blood sugar it was 82 notified RN of blood sugar

## 2013-12-31 NOTE — Discharge Instructions (Signed)
Return to the emergency room for severely worsening abdominal pain, abdominal pain that localizes to a particular area (especially the right lower part of the belly), pain that persists past 8-10 hours, blood in stool or vomit, severe weakness, fainting, or fever.   Maintain hydration by drinking small amounts of clear fluids frequently, then soft diet, and then advance to a solid diet as tolerated. Avoid foods that are spicy, high in fat or dairy.  Your imaging today shows nodules in your lungs. Your primary care doctor must be made aware of this and further imaging may be ordered. Please make them aware that they will need to obtain records for further management.  Please follow with your primary care doctor in the next 2 days for a check-up. They must obtain records for further management.   Do not hesitate to return to the Emergency Department for any new, worsening or concerning symptoms.     Abdominal Pain Many things can cause abdominal pain. Usually, abdominal pain is not caused by a disease and will improve without treatment. It can often be observed and treated at home. Your health care provider will do a physical exam and possibly order blood tests and X-rays to help determine the seriousness of your pain. However, in many cases, more time must pass before a clear cause of the pain can be found. Before that point, your health care provider may not know if you need more testing or further treatment. HOME CARE INSTRUCTIONS  Monitor your abdominal pain for any changes. The following actions may help to alleviate any discomfort you are experiencing:  Only take over-the-counter or prescription medicines as directed by your health care provider.  Do not take laxatives unless directed to do so by your health care provider.  Try a clear liquid diet (broth, tea, or water) as directed by your health care provider. Slowly move to a bland diet as tolerated. SEEK MEDICAL CARE IF:  You have  unexplained abdominal pain.  You have abdominal pain associated with nausea or diarrhea.  You have pain when you urinate or have a bowel movement.  You experience abdominal pain that wakes you in the night.  You have abdominal pain that is worsened or improved by eating food.  You have abdominal pain that is worsened with eating fatty foods.  You have a fever. SEEK IMMEDIATE MEDICAL CARE IF:   Your pain does not go away within 2 hours.  You keep throwing up (vomiting).  Your pain is felt only in portions of the abdomen, such as the right side or the left lower portion of the abdomen.  You pass bloody or black tarry stools. MAKE SURE YOU:  Understand these instructions.   Will watch your condition.   Will get help right away if you are not doing well or get worse.  Document Released: 12/06/2004 Document Revised: 03/03/2013 Document Reviewed: 11/05/2012 [facility] Patient Information 2015 [place], [place]. This information is not intended to replace advice given to you by your health care provider. Make sure you discuss any questions you have with your health care provider.

## 2014-01-01 ENCOUNTER — Other Ambulatory Visit: Payer: Medicare Other

## 2014-01-01 ENCOUNTER — Telehealth: Payer: Self-pay | Admitting: Gastroenterology

## 2014-01-01 ENCOUNTER — Ambulatory Visit: Payer: Medicare Other | Admitting: Endocrinology

## 2014-01-01 LAB — URINE CULTURE: Colony Count: 30000

## 2014-01-03 NOTE — ED Provider Notes (Signed)
Medical screening examination/treatment/procedure(s) were performed by non-physician practitioner and as supervising physician I was immediately available for consultation/collaboration.  Ephraim Hamburger, MD 01/03/14 [phone]

## 2014-01-04 ENCOUNTER — Encounter (HOSPITAL_COMMUNITY): Payer: Self-pay | Admitting: *Deleted

## 2014-01-04 ENCOUNTER — Telehealth: Payer: Self-pay | Admitting: *Deleted

## 2014-01-04 MED ORDER — CIPROFLOXACIN HCL 500 MG PO TABS: 500.0000 mg | ORAL_TABLET | Freq: Two times a day (BID) | ORAL | Status: DC

## 2014-01-04 NOTE — Addendum Note (Signed)
Addended by: Oda Kilts on: 01/04/2014 08:57 AM   Modules accepted: Orders

## 2014-01-04 NOTE — Telephone Encounter (Signed)
Advised the doctor is out of the office until 01/04/14

## 2014-01-04 NOTE — Telephone Encounter (Signed)
Patient rescheduled from 11/4 to 10/29 patient aware of date and time changes and aware this is to be done at [facility]

## 2014-01-04 NOTE — Telephone Encounter (Signed)
Called in prescription for pt. Called patient to inform and make sure that she can take cipro. And has no allergies to it  Told patient to start taking medication today

## 2014-01-04 NOTE — Telephone Encounter (Signed)
From: Inda Castle, MD    Sent: 01/04/2014   8:23 AM      To: Oda Kilts, CMA Subject: RE: ERCP/SPY GLASS                             Please begin Cipro 500 mg twice a day

## 2014-01-04 NOTE — Telephone Encounter (Signed)
Per Dr Deatra Ina, Send Cipro  500mg  twice a day to start today

## 2014-01-05 ENCOUNTER — Telehealth: Payer: Self-pay | Admitting: Gastroenterology

## 2014-01-05 NOTE — Telephone Encounter (Signed)
Daughter wants to know what the plan is on her mother's stent. She is not upset, just lives out of town and wants to know. Is it to be replaced? Is this something that will need to be done periodically?

## 2014-01-05 NOTE — ED Provider Notes (Signed)
Medical screening examination/treatment/procedure(s) were performed by non-physician practitioner and as supervising physician I was immediately available for consultation/collaboration.   EKG Interpretation None        Debby Freiberg, MD 01/05/14 0104

## 2014-01-05 NOTE — Telephone Encounter (Signed)
Advised that the present stent will be removed and replaced if there is still a stricture. The cause of the stricture will be investigated. Stents last approximately 2 months. Left this information on her voicemail per her request.

## 2014-01-06 NOTE — Anesthesia Preprocedure Evaluation (Signed)
Anesthesia Evaluation  Patient identified by MRN, date of birth, ID band Patient awake    Reviewed: Allergy & Precautions, H&P , NPO status , Patient's Chart, lab work & pertinent test results  Airway Mallampati: II  TM Distance: >3 FB Neck ROM: Full    Dental no notable dental hx.    Pulmonary COPDCurrent Smoker,  breath sounds clear to auscultation  Pulmonary exam normal       Cardiovascular hypertension, Pt. on medications and Pt. on home beta blockers Rhythm:Regular Rate:Normal     Neuro/Psych  Headaches, Seizures -,  PSYCHIATRIC DISORDERS Anxiety Depression Schizophrenia    GI/Hepatic Neg liver ROS, GERD-  Medicated and Controlled,  Endo/Other  diabetes, Type 2  Renal/GU negative Renal ROS  negative genitourinary   Musculoskeletal  (+) Fibromyalgia -, narcotic dependent  Abdominal   Peds negative pediatric ROS (+)  Hematology negative hematology ROS (+) anemia ,   Anesthesia Other Findings   Reproductive/Obstetrics negative OB ROS                             Anesthesia Physical Anesthesia Plan  ASA: III  Anesthesia Plan: General   Post-op Pain Management:    Induction: Intravenous  Airway Management Planned: Oral ETT  Additional Equipment:   Intra-op Plan:   Post-operative Plan: Extubation in OR  Informed Consent: I have reviewed the patients History and Physical, chart, labs and discussed the procedure including the risks, benefits and alternatives for the proposed anesthesia with the patient or authorized representative who has indicated his/her understanding and acceptance.   Dental advisory given  Plan Discussed with: CRNA  Anesthesia Plan Comments:         Anesthesia Quick Evaluation

## 2014-01-07 ENCOUNTER — Ambulatory Visit (HOSPITAL_COMMUNITY): Payer: Medicare Other

## 2014-01-07 ENCOUNTER — Ambulatory Visit (HOSPITAL_COMMUNITY): Payer: Medicare Other | Admitting: Certified Registered Nurse Anesthetist

## 2014-01-07 ENCOUNTER — Encounter (HOSPITAL_COMMUNITY): Payer: Self-pay

## 2014-01-07 ENCOUNTER — Encounter (HOSPITAL_COMMUNITY)
Admission: RE | Disposition: A | Discharge status: Home / Self Care | Payer: Medicare Other | Source: Ambulatory Visit | Attending: Gastroenterology

## 2014-01-07 ENCOUNTER — Encounter (HOSPITAL_COMMUNITY): Payer: Medicare Other | Admitting: Certified Registered Nurse Anesthetist

## 2014-01-07 ENCOUNTER — Ambulatory Visit (HOSPITAL_COMMUNITY)
Admission: RE | Admit: 2014-01-07 | Discharge: 2014-01-07 | Disposition: A | Discharge status: Home / Self Care | Payer: Medicare Other | Source: Ambulatory Visit | Attending: Gastroenterology | Admitting: Gastroenterology

## 2014-01-07 DIAGNOSIS — J449 Chronic obstructive pulmonary disease, unspecified: Secondary | ICD-10-CM | POA: Insufficient documentation

## 2014-01-07 DIAGNOSIS — M810 Age-related osteoporosis without current pathological fracture: Secondary | ICD-10-CM | POA: Diagnosis not present

## 2014-01-07 DIAGNOSIS — Z853 Personal history of malignant neoplasm of breast: Secondary | ICD-10-CM | POA: Diagnosis not present

## 2014-01-07 DIAGNOSIS — K8050 Calculus of bile duct without cholangitis or cholecystitis without obstruction: Secondary | ICD-10-CM | POA: Insufficient documentation

## 2014-01-07 DIAGNOSIS — K861 Other chronic pancreatitis: Secondary | ICD-10-CM | POA: Insufficient documentation

## 2014-01-07 DIAGNOSIS — Z794 Long term (current) use of insulin: Secondary | ICD-10-CM | POA: Diagnosis not present

## 2014-01-07 DIAGNOSIS — E119 Type 2 diabetes mellitus without complications: Secondary | ICD-10-CM | POA: Diagnosis not present

## 2014-01-07 DIAGNOSIS — I10 Essential (primary) hypertension: Secondary | ICD-10-CM | POA: Insufficient documentation

## 2014-01-07 DIAGNOSIS — Z882 Allergy status to sulfonamides status: Secondary | ICD-10-CM | POA: Insufficient documentation

## 2014-01-07 DIAGNOSIS — K2270 Barrett's esophagus without dysplasia: Secondary | ICD-10-CM | POA: Insufficient documentation

## 2014-01-07 DIAGNOSIS — Z4689 Encounter for fitting and adjustment of other specified devices: Secondary | ICD-10-CM | POA: Insufficient documentation

## 2014-01-07 DIAGNOSIS — F1120 Opioid dependence, uncomplicated: Secondary | ICD-10-CM | POA: Insufficient documentation

## 2014-01-07 DIAGNOSIS — F329 Major depressive disorder, single episode, unspecified: Secondary | ICD-10-CM | POA: Insufficient documentation

## 2014-01-07 DIAGNOSIS — G43909 Migraine, unspecified, not intractable, without status migrainosus: Secondary | ICD-10-CM | POA: Diagnosis not present

## 2014-01-07 DIAGNOSIS — K219 Gastro-esophageal reflux disease without esophagitis: Secondary | ICD-10-CM | POA: Insufficient documentation

## 2014-01-07 DIAGNOSIS — Z888 Allergy status to other drugs, medicaments and biological substances status: Secondary | ICD-10-CM | POA: Insufficient documentation

## 2014-01-07 DIAGNOSIS — M797 Fibromyalgia: Secondary | ICD-10-CM | POA: Diagnosis not present

## 2014-01-07 DIAGNOSIS — G8929 Other chronic pain: Secondary | ICD-10-CM | POA: Diagnosis not present

## 2014-01-07 DIAGNOSIS — E559 Vitamin D deficiency, unspecified: Secondary | ICD-10-CM | POA: Insufficient documentation

## 2014-01-07 DIAGNOSIS — K831 Obstruction of bile duct: Secondary | ICD-10-CM

## 2014-01-07 DIAGNOSIS — F17210 Nicotine dependence, cigarettes, uncomplicated: Secondary | ICD-10-CM | POA: Insufficient documentation

## 2014-01-07 DIAGNOSIS — K839 Disease of biliary tract, unspecified: Secondary | ICD-10-CM

## 2014-01-07 DIAGNOSIS — F419 Anxiety disorder, unspecified: Secondary | ICD-10-CM | POA: Insufficient documentation

## 2014-01-07 DIAGNOSIS — K838 Other specified diseases of biliary tract: Secondary | ICD-10-CM | POA: Diagnosis present

## 2014-01-07 DIAGNOSIS — R5382 Chronic fatigue, unspecified: Secondary | ICD-10-CM | POA: Diagnosis not present

## 2014-01-07 HISTORY — PX: ERCP: SHX5425

## 2014-01-07 HISTORY — PX: SPYGLASS CHOLANGIOSCOPY: SHX5441

## 2014-01-07 LAB — GLUCOSE, CAPILLARY: GLUCOSE-CAPILLARY: 128 mg/dL — AB (ref 70–99)

## 2014-01-07 SURGERY — ERCP, WITH INTERVENTION IF INDICATED
Anesthesia: General

## 2014-01-07 MED ORDER — PROPOFOL 10 MG/ML IV BOLUS
INTRAVENOUS | Status: AC
  Filled 2014-01-07: qty 20

## 2014-01-07 MED ORDER — GLUCAGON HCL RDNA (DIAGNOSTIC) 1 MG IJ SOLR
INTRAMUSCULAR | Status: AC
  Filled 2014-01-07: qty 2

## 2014-01-07 MED ORDER — ONDANSETRON HCL 4 MG/2ML IJ SOLN
INTRAMUSCULAR | Status: AC
  Filled 2014-01-07: qty 2

## 2014-01-07 MED ORDER — LIDOCAINE HCL (CARDIAC) 20 MG/ML IV SOLN
INTRAVENOUS | Status: AC
  Filled 2014-01-07: qty 5

## 2014-01-07 MED ORDER — EPHEDRINE SULFATE 50 MG/ML IJ SOLN
INTRAMUSCULAR | Status: DC | PRN
  Administered 2014-01-07 (×2): 10 mg via INTRAVENOUS

## 2014-01-07 MED ORDER — SODIUM CHLORIDE 0.9 % IJ SOLN
INTRAMUSCULAR | Status: AC
  Filled 2014-01-07: qty 10

## 2014-01-07 MED ORDER — FENTANYL CITRATE 0.05 MG/ML IJ SOLN
INTRAMUSCULAR | Status: AC
  Filled 2014-01-07: qty 2

## 2014-01-07 MED ORDER — LACTATED RINGERS IV SOLN
INTRAVENOUS | Status: DC | PRN
  Administered 2014-01-07: 12:00:00 via INTRAVENOUS

## 2014-01-07 MED ORDER — EPHEDRINE SULFATE 50 MG/ML IJ SOLN
INTRAMUSCULAR | Status: AC
  Filled 2014-01-07: qty 1

## 2014-01-07 MED ORDER — PROPOFOL 10 MG/ML IV BOLUS
INTRAVENOUS | Status: DC | PRN
  Administered 2014-01-07: 130 mg via INTRAVENOUS
  Administered 2014-01-07: 70 mg via INTRAVENOUS

## 2014-01-07 MED ORDER — SUCCINYLCHOLINE CHLORIDE 20 MG/ML IJ SOLN
INTRAMUSCULAR | Status: DC | PRN
  Administered 2014-01-07: 100 mg via INTRAVENOUS

## 2014-01-07 MED ORDER — CIPROFLOXACIN IN D5W 400 MG/200ML IV SOLN
INTRAVENOUS | Status: AC
  Filled 2014-01-07: qty 200

## 2014-01-07 MED ORDER — SODIUM CHLORIDE 0.9 % IV SOLN: INTRAVENOUS | Status: DC

## 2014-01-07 MED ORDER — ONDANSETRON HCL 4 MG/2ML IJ SOLN
INTRAMUSCULAR | Status: DC | PRN
  Administered 2014-01-07: 4 mg via INTRAVENOUS

## 2014-01-07 MED ORDER — DEXAMETHASONE SODIUM PHOSPHATE 10 MG/ML IJ SOLN
INTRAMUSCULAR | Status: DC | PRN
  Administered 2014-01-07: 10 mg via INTRAVENOUS

## 2014-01-07 MED ORDER — LIDOCAINE HCL (CARDIAC) 20 MG/ML IV SOLN
INTRAVENOUS | Status: DC | PRN
  Administered 2014-01-07: 100 mg via INTRAVENOUS

## 2014-01-07 MED ORDER — CIPROFLOXACIN IN D5W 400 MG/200ML IV SOLN
400.0000 mg | Freq: Two times a day (BID) | INTRAVENOUS | Status: DC
  Administered 2014-01-07: 400 mg via INTRAVENOUS

## 2014-01-07 NOTE — Transfer of Care (Signed)
Immediate Anesthesia Transfer of Care Note  Patient: ELVENA OYER  Procedure(s) Performed: Procedure(s) (LRB): ENDOSCOPIC RETROGRADE CHOLANGIOPANCREATOGRAPHY (ERCP) (N/A) SPYGLASS CHOLANGIOSCOPY (N/A)  Patient Location: PACU  Anesthesia Type: General  Level of Consciousness: sedated, patient cooperative and responds to stimulation  Airway & Oxygen Therapy: Patient Spontanous Breathing and Patient connected to face mask oxgen  Post-op Assessment: Report given to PACU RN and Post -op Vital signs reviewed and stable  Post vital signs: Reviewed and stable  Complications: No apparent anesthesia complications

## 2014-01-07 NOTE — Anesthesia Postprocedure Evaluation (Signed)
  Anesthesia Post-op Note  Patient: Lindsey Gomez  Procedure(s) Performed: Procedure(s) (LRB): ENDOSCOPIC RETROGRADE CHOLANGIOPANCREATOGRAPHY (ERCP) (N/A) SPYGLASS CHOLANGIOSCOPY (N/A)  Patient Location: PACU  Anesthesia Type: General  Level of Consciousness: awake and alert   Airway and Oxygen Therapy: Patient Spontanous Breathing  Post-op Pain: mild  Post-op Assessment: Post-op Vital signs reviewed, Patient's Cardiovascular Status Stable, Respiratory Function Stable, Patent Airway and No signs of Nausea or vomiting  Last Vitals:  Filed Vitals:   01/07/14 1530  BP: 155/90  Pulse: 69  Temp:   Resp: 20    Post-op Vital Signs: stable   Complications: No apparent anesthesia complications

## 2014-01-07 NOTE — Op Note (Signed)
[facility] [place], [id number]   ERCP PROCEDURE REPORT        EXAM DATE: 01/07/2014  PATIENT NAME:          Lindsey Gomez, Lindsey Gomez          MR #: [id number] BIRTHDATE:       Apr 10, 1945     VISIT #:     [phone] ATTENDING:     Inda Castle, MD     STATUS:     outpatient ASSISTANT:      William Dalton and Cleda Daub  INDICATIONS:  The patient is a 68 yr old female here for an ERCP due to PROCEDURE PERFORMED:     ERCP with sphincterotomy/papillotomy ERCP with removal of calculus/calculi ERCP with stent removal cholangioscopy with biopsy EGD MEDICATIONS:     Per Anesthesia and Cipro 400 mg IV  CONSENT: The patient understands the risks and benefits of the procedure and understands that these risks include, but are not limited to: sedation, allergic reaction, infection, perforation and/or bleeding. Alternative means of evaluation and treatment include, among others: physical exam, x-rays, and/or surgical intervention. The patient elects to proceed with this endoscopic procedure.  DESCRIPTION OF PROCEDURE: During intra-op preparation period all mechanical & medical equipment was checked for proper function. Hand hygiene and appropriate measures for infection prevention was taken. After the risks, benefits and alternatives of the procedure were thoroughly explained, Informed was verified, confirmed and timeout was successfully executed by the treatment team. With the patient in left semi-prone position, medications were administered intravenously.The    was passed from the mouth into the esophagus and further advanced from the esophagus into the stomach. From stomach scope was directed to the third portion of the duodenum. Major papilla was aligned with the duodenoscope. The scope position was confirmed fluoroscopically. Rest of the findings/therapeutics are given below. The scope was then completely withdrawn from the patient and the  procedure completed. The pulse, BP, and O2 saturation were monitored and documented by the physician and the nursing staff throughout the entire procedure. The patient was cared for as planned according to standard protocol. The patient was then discharged to recovery in stable condition and with appropriate post procedure care.  Fresh blood was seen in the stomach and GE junction. A plastic biliary stent was removed from the ampulla utilizing a polypectomy snare. The common bile duct was cannulated with a 0.35 mm guidewire. Injection demonstrated a diffusely dilated bile duct and intrahepatic ducts, especially in the left lobe of the liver. Multiple filling defects were seen.  There appeared to be some stricturing at the very distal portion of the bile duct.   A 15 mm sphincterotomy was made.  The duct was swept with a 15 and then 18 mm balloon stone catheter and multiple stones and fragments were delivered into the duodenum.  There is persistent mild stricturing of the very distal portion of the bile duct. The choledocho scope was directly inserted into the bile duct. Fragments of mucus and stones were seen in the bile duct.  The more proximal bile duct wall.  Normal.  It was reddened in the very distal portion in the area of the stricture although no gross tumor biopsies of the distal bile duct wall were performed. The cholangiogram demonstrated a short stricture of the very distal bile duct but there was good drainage of contrast material. Following withdrawal of the duodenum scope endoscope was inserted under direct vision into the esophagus and  stomach.  Again noted was a small mass or fresh blood in the stomach.  There were areas of mucosal abrasion.  Mucosa seemed to be friable accounting for the small amount of blood.  The duodenum, GE junction and esophagus were unremarkable.    ADVERSE EVENT: IMPRESSIONS:     1. 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  Choledocholithiasis - status post sphincterotomy and stone extraction 3.. Friable gastric mucosa with minimal bleeding  RECOMMENDATIONS:     1.  follow-up LFTs in 10 days 2.  Office visit 2-3 weeks REPEAT EXAM:   ___________________________________ Inda Castle, MD eSigned:  Inda Castle, MD 2014/01/11 3:31 PM   cc:  CPT CODES: ICD9 CODES:  The ICD and CPT codes recommended by this software are interpretations from the data that the clinical staff has captured with the software.  The verification of the translation of this report to the ICD and CPT codes and modifiers is the sole responsibility of the health care institution and practicing physician where this report was generated.  [facility] [facility]. will not be held responsible for the validity of the ICD and CPT codes included on this report.  AMA assumes no liability for data contained or not contained herein. CPT is a Designer, television/film set of the [employer].   PATIENT NAME:  Lindsey Gomez, Lindsey Gomez MR#: [id number]

## 2014-01-07 NOTE — Interval H&P Note (Signed)
History and Physical Interval Note:  01/07/2014 1:14 PM  Lindsey Gomez  has presented today for surgery, with the diagnosis of bile duct stent  The various methods of treatment have been discussed with the patient and family. After consideration of risks, benefits and other options for treatment, the patient has consented to  Procedure(s): ENDOSCOPIC RETROGRADE CHOLANGIOPANCREATOGRAPHY (ERCP) (N/A) SPYGLASS CHOLANGIOSCOPY (N/A) as a surgical intervention .  The patient's history has been reviewed, patient examined, no change in status, stable for surgery.  I have reviewed the patient's chart and labs.  Questions were answered to the patient's satisfaction.    The recent H&P (dated *12/30/13**) was reviewed, the patient was examined and there is no change in the patients condition since that H&P was completed.   Erskine Emery  01/07/2014, 1:14 PM    Erskine Emery

## 2014-01-07 NOTE — H&P (View-Only) (Signed)
_                                                                                                                History of Present Illness:  Lindsey Gomez is a 68 year old Bra[place] female status post biliary stent placement for a distal bile duct stricture in July, 2015 here for a followup.  Cytology demonstrated atypia.  She has a history of breast cancer and is status post partial pancreatectomy for a pseudocyst.  Patient has no GI complaints except for the last 3 days where she has had nausea, vomiting and diarrhea.  She complains of myalgias as well.  Up to then she had been feeling perfectly well.  He complains of frequent hemorrhoidal discomfort despite taking various topicals.   Past Medical History  Diagnosis Date  . Depression   . Type II or unspecified type diabetes mellitus without mention of complication, not stated as uncontrolled   . HTN (hypertension)   . Chronic pain   . Fibromyalgia   . Migraines   . History of breast cancer     Right  . Chronic pancreatitis   . Vitamin D deficiency   . GERD (gastroesophageal reflux disease)   . Finger dislocation 2009    L 3rd  . COPD (chronic obstructive pulmonary disease)   . Anemia   . Fractured sternum 11/2008  . Osteoporosis     Reclast too expensive  . Barrett's esophagus   . Chronic fatigue   . Diverticulosis   . Opioid dependence   . Cancer     HX OF BREAST CANCER   . Convulsions/seizures 10/12/2013    Possible benzodiazepine withdrawal seizure  . 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 Abnormality of gait 10/12/2013  . Anxiety   . Depression   . Diabetes    Past Surgical History  Procedure Laterality Date  . Nasal sinus surgery    . Pancreatectomy      40%  . Cholecystectomy    . Appendectomy    . Abdominal hysterectomy    . Lobectomy Left 2005    granulomatous lesion.   . Breast lumpectomy Left   . Ercp N/A 10/05/2013    Procedure: ENDOSCOPIC RETROGRADE CHOLANGIOPANCREATOGRAPHY (ERCP);  Surgeon: Ladene Artist, MD;  Location[facility] ENDOSCOPY;  Service: Endoscopy;  Laterality: N/A;  . Cataract extraction Bilateral    family history includes Alzheimer's disease in her sister; COPD in her father; Dementia in her sister; Diabetes in her maternal uncle; Heart attack in her brother and brother; Heart disease in her father; Leukemia in her mother. There is no history of Colon cancer or Colon polyps. Current Outpatient Prescriptions  Medication Sig Dispense Refill  . ACCU-CHEK AVIVA PLUS test strip 1 each by Other route 5 (five) times daily.       [name] atenolol (TENORMIN) 100 MG tablet Take 1 tablet (100 mg total) by mouth every morning.  30 tablet  11  . atorvastatin (LIPITOR) 10 MG tablet Take 1 tablet (10 mg total) by  mouth daily.  30 tablet  11  . B-D ULTRAFINE III SHORT PEN 31G X 8 MM MISC       . calcitRIOL (ROCALTROL) 0.5 MCG capsule Take 2 capsules (1 mcg total) by mouth daily.  60 capsule  11  . docusate sodium (COLACE) 100 MG capsule Take 100 mg by mouth daily as needed for mild constipation.      [name] glucagon (GLUCAGON EMERGENCY) 1 MG injection Inject 1 mg into the vein once as needed (for glucose).      . hydrocortisone (CORTEF) 10 MG tablet Take 1 tablet (10 mg total) by mouth 2 (two) times daily.  60 tablet  11  . insulin aspart (NOVOLOG) 100 UNIT/ML FlexPen Inject 4-10 Units into the skin 3 (three) times daily with meals. Inject 10 units subq with breakfast, inject 4 units subq with lunch and inject 5 units with evening meal.      . insulin detemir (LEVEMIR) 100 UNIT/ML injection Inject 8 Units into the skin at bedtime.       [name] isometheptene-acetaminophen-dichloralphenazone (MIDRIN) 65-325-100 MG capsule Take 1 capsule by mouth 4 (four) times daily as needed for migraine. Maximum 5 capsules in 12 hours for migraine headaches, 8 capsules in 24 hours for tension headaches.  40 capsule  1  . loperamide (IMODIUM) 2 MG capsule 2 mg daily.      [name] LORazepam (ATIVAN) 1 MG tablet Take 1 tablet (1 mg total)  by mouth 3 (three) times daily.  60 tablet  3  . mometasone (NASONEX) 50 MCG/ACT nasal spray Place 2 sprays into both nostrils daily.      [name] omeprazole (PRILOSEC) 40 MG capsule Take 1 capsule (40 mg total) by mouth 2 (two) times daily.  60 capsule  11  . ondansetron (ZOFRAN) 4 MG tablet Take 1 tablet (4 mg total) by mouth every 8 (eight) hours as needed for nausea or vomiting.  30 tablet  1  . OxyCODONE (OXYCONTIN) 10 mg T12A 12 hr tablet Take 1 tablet (10 mg total) by mouth 2 (two) times daily.  60 tablet  0  . prochlorperazine (COMPAZINE) 10 MG tablet 10 mg daily.      [name] topiramate (TOPAMAX) 100 MG tablet Take 0.5 tablets (50 mg total) by mouth 2 (two) times daily.  60 tablet  11  . triamcinolone (KENALOG) 0.1 % ointment Apply 1 application topically 2 (two) times daily as needed (itching).       . Vortioxetine HBr (BRINTELLIX) 20 MG TABS Take 10 mg by mouth daily.  30 tablet  5  . zolmitriptan (ZOMIG) 5 MG tablet Take 1 tablet (5 mg total) by mouth 2 (two) times daily as needed for migraine.  10 tablet  5   Current Facility-Administered Medications  Medication Dose Route Frequency Provider Last Rate Last Dose  . 0.9 %  sodium chloride infusion  500 mL Intravenous Continuous Inda Castle, MD      . promethazine (PHENERGAN) injection 50 mg  50 mg Intramuscular Q6H PRN Lew Dawes V, MD   50 mg at 03/20/13 1601   Allergies as of 12/30/2013 - Review Complete 12/30/2013  Allergen Reaction Noted  . Milnacipran Swelling 05/25/2008  . Rizatriptan benzoate Nausea And Vomiting 10/02/2013  . Gabapentin Other (See Comments) 06/04/2012  . Moxifloxacin Other (See Comments) 12/11/2006  . Sulfonamide derivatives Rash 12/11/2006  . Venlafaxine Other (See Comments) 05/09/2007    reports that she has been smoking Cigarettes.  She has a 40 pack-year smoking history.  She has never used smokeless tobacco. She reports that she does not drink alcohol or use illicit drugs.   Review of Systems:  Pertinent positive and negative review of systems were noted in the above HPI section. All other review of systems were otherwise negative.  Vital signs were reviewed in today's medical record Physical Exam: General: Thin female in no acute distress Skin: anicteric Head: Normocephalic and atraumatic Eyes:  sclerae anicteric, EOMI Ears: Normal auditory acuity Mouth: No deformity or lesions Neck: Supple, no masses or thyromegaly Lungs: Clear throughout to auscultation Heart: Regular rate and rhythm; no murmurs, rubs or bruits Abdomen: Soft, non tender and non distended. No masses, hepatosplenomegaly or hernias noted. Normal Bowel sounds Rectal: Grade 3 hemorrhoids Musculoskeletal: Symmetrical with no gross deformities  Skin: No lesions on visible extremities Pulses:  Normal pulses noted Extremities: No clubbing, cyanosis, edema or deformities noted Neurological: Alert oriented x 4, grossly nonfocal Cervical Nodes:  No significant cervical adenopathy Inguinal Nodes: No significant inguinal adenopathy Psychological:  Alert and cooperative. Normal mood and affect  See Assessment and Plan under Problem List

## 2014-01-08 ENCOUNTER — Encounter (HOSPITAL_COMMUNITY): Payer: Self-pay | Admitting: Gastroenterology

## 2014-01-11 ENCOUNTER — Other Ambulatory Visit: Payer: Self-pay | Admitting: Neurology

## 2014-01-11 ENCOUNTER — Other Ambulatory Visit: Payer: Self-pay

## 2014-01-11 ENCOUNTER — Other Ambulatory Visit: Payer: Self-pay | Admitting: Geriatric Medicine

## 2014-01-11 DIAGNOSIS — R198 Other specified symptoms and signs involving the digestive system and abdomen: Secondary | ICD-10-CM

## 2014-01-11 DIAGNOSIS — R918 Other nonspecific abnormal finding of lung field: Secondary | ICD-10-CM

## 2014-01-11 MED ORDER — OXYCODONE HCL ER 10 MG PO T12A: 10.0000 mg | EXTENDED_RELEASE_TABLET | Freq: Two times a day (BID) | ORAL | Status: DC

## 2014-01-11 NOTE — Telephone Encounter (Signed)
I called the patient to let them know their Rx for Oxycodone was ready for pickup. Patient was instructed to bring Photo ID. 

## 2014-01-11 NOTE — Telephone Encounter (Signed)
Request entered, forwarded to provider for approval.  

## 2014-01-11 NOTE — Telephone Encounter (Signed)
[name], patient's primary care giver, calling to request patient's oxycodone refill, please call when ready for pick up.

## 2014-01-13 ENCOUNTER — Ambulatory Visit
Admission: RE | Admit: 2014-01-13 | Discharge: 2014-01-13 | Disposition: A | Discharge status: Home / Self Care | Payer: Medicare Other | Source: Ambulatory Visit | Attending: Geriatric Medicine | Admitting: Geriatric Medicine

## 2014-01-13 DIAGNOSIS — R918 Other nonspecific abnormal finding of lung field: Secondary | ICD-10-CM

## 2014-01-13 MED ORDER — IOHEXOL 350 MG/ML SOLN
75.0000 mL | Freq: Once | INTRAVENOUS | Status: AC | PRN
  Administered 2014-01-13: 75 mL via INTRAVENOUS

## 2014-01-14 ENCOUNTER — Encounter: Payer: Self-pay | Admitting: Physician Assistant

## 2014-01-14 ENCOUNTER — Ambulatory Visit (INDEPENDENT_AMBULATORY_CARE_PROVIDER_SITE_OTHER): Payer: Medicare Other | Admitting: Physician Assistant

## 2014-01-14 ENCOUNTER — Other Ambulatory Visit (INDEPENDENT_AMBULATORY_CARE_PROVIDER_SITE_OTHER): Payer: Medicare Other

## 2014-01-14 ENCOUNTER — Other Ambulatory Visit: Payer: Self-pay

## 2014-01-14 VITALS — BP 146/74 | HR 80 | Ht 61.0 in | Wt 91.1 lb

## 2014-01-14 DIAGNOSIS — K831 Obstruction of bile duct: Secondary | ICD-10-CM

## 2014-01-14 DIAGNOSIS — R197 Diarrhea, unspecified: Secondary | ICD-10-CM

## 2014-01-14 DIAGNOSIS — R1110 Vomiting, unspecified: Secondary | ICD-10-CM

## 2014-01-14 DIAGNOSIS — R112 Nausea with vomiting, unspecified: Secondary | ICD-10-CM

## 2014-01-14 LAB — COMPREHENSIVE METABOLIC PANEL WITH GFR
ALT: 37 U/L — ABNORMAL HIGH (ref 0–35)
AST: 26 U/L (ref 0–37)
Albumin: 2.8 g/dL — ABNORMAL LOW (ref 3.5–5.2)
Alkaline Phosphatase: 78 U/L (ref 39–117)
BUN: 12 mg/dL (ref 6–23)
CO2: 28 meq/L (ref 19–32)
Calcium: 9.8 mg/dL (ref 8.4–10.5)
Chloride: 104 meq/L (ref 96–112)
Creatinine, Ser: 1.1 mg/dL (ref 0.4–1.2)
GFR: 52.16 mL/min — ABNORMAL LOW (ref >60.00)
Glucose, Bld: 133 mg/dL — ABNORMAL HIGH (ref 70–99)
Potassium: 3 meq/L — ABNORMAL LOW (ref 3.5–5.1)
Sodium: 139 meq/L (ref 135–145)
Total Bilirubin: 0.2 mg/dL (ref 0.2–1.2)
Total Protein: 6 g/dL (ref 6.0–8.3)

## 2014-01-14 LAB — CBC WITH DIFFERENTIAL/PLATELET
Basophils Absolute: 0 K/uL (ref 0.0–0.1)
Basophils Relative: 0.5 % (ref 0.0–3.0)
Eosinophils Absolute: 0.1 K/uL (ref 0.0–0.7)
Eosinophils Relative: 1.3 % (ref 0.0–5.0)
HCT: 33.3 % — ABNORMAL LOW (ref 36.0–46.0)
Hemoglobin: 10.9 g/dL — ABNORMAL LOW (ref 12.0–15.0)
Lymphocytes Relative: 28.6 % (ref 12.0–46.0)
Lymphs Abs: 2.1 K/uL (ref 0.7–4.0)
MCHC: 32.8 g/dL (ref 30.0–36.0)
MCV: 87.7 fl (ref 78.0–100.0)
Monocytes Absolute: 0.4 K/uL (ref 0.1–1.0)
Monocytes Relative: 5.7 % (ref 3.0–12.0)
Neutro Abs: 4.8 K/uL (ref 1.4–7.7)
Neutrophils Relative %: 63.9 % (ref 43.0–77.0)
Platelets: 174 K/uL (ref 150.0–400.0)
RBC: 3.8 Mil/uL — ABNORMAL LOW (ref 3.87–5.11)
RDW: 17.6 % — ABNORMAL HIGH (ref 11.5–15.5)
WBC: 7.5 K/uL (ref 4.0–10.5)

## 2014-01-14 MED ORDER — POTASSIUM CHLORIDE CRYS ER 20 MEQ PO TBCR: 20.0000 meq | EXTENDED_RELEASE_TABLET | Freq: Two times a day (BID) | ORAL | Status: DC

## 2014-01-14 MED ORDER — SACCHAROMYCES BOULARDII 250 MG PO CAPS: 250.0000 mg | ORAL_CAPSULE | Freq: Two times a day (BID) | ORAL | Status: DC

## 2014-01-14 MED ORDER — DICYCLOMINE HCL 10 MG PO CAPS: 10.0000 mg | ORAL_CAPSULE | Freq: Three times a day (TID) | ORAL | Status: DC

## 2014-01-14 MED ORDER — METRONIDAZOLE 250 MG PO TABS: 250.0000 mg | ORAL_TABLET | Freq: Four times a day (QID) | ORAL | Status: AC

## 2014-01-14 MED ORDER — ONDANSETRON HCL 8 MG PO TABS: 8.0000 mg | ORAL_TABLET | Freq: Two times a day (BID) | ORAL | Status: DC

## 2014-01-14 NOTE — Progress Notes (Signed)
Subjective:    Patient ID: Lindsey Gomez, female    DOB: Jul 23, 1945, 68 y.o.   MRN: [id number]  HPI  Lindsey Gomez is a 68 year old female known to Dr. Deatra Ina who has multiple medical problems She has history of depression, memory disorder, opioid dependence, diabetes mellitus, fibromyalgia, COPD, history of breast cancer. She is felt to have a component of chronic pancreatitis and is status post partial pancreatectomy remotely for a pseudocyst. She has a known bile duct stricture for which she underwent ERCP and stent placement in July 2015. Cytology didn't show some atypia. She underwent repeat ERCP on 01/07/2014 per Dr. Deatra Ina. The stent was removed, she underwent sphincterotomy and removal of multiple common bile duct stones. Cholangioscopy was also done there was some erythema in the very distal portion in the area of the stricture but no gross tumor noted. Biopsies were taken again and this showed some atypical glandular epithelium and inflammation which could be secondary to stricture could not exclude malignancy. Patient has been having problems with intermittent nausea and vomiting and actually had an ER visit on 01/01/2014 several days prior to that ERCP. She was noted to have scattered pulmonary nodules and ductal dilation with stent in place. She returns today for follow-up stating that she's not been feeling well over the past 3 weeks she's been having problems with persistent nausea and vomiting on an almost daily basis difficult to sort out whether she is keeping down some of her meals or not, her weight is stable she's had no fever or chills. She is not having any ongoing complaints of abdominal pain. She is also having persistent diarrhea which she says is been present for 3 weeks as well. She is having 6 or 7 loose bowel movements per day nonbloody. Her caregiver who comes with her today also says she's been having intermittent problems with coughing and choking both with liquids and solids. On  further questioning she has been out of Bentyl and also out of Zofran, states she has been taking her other meds Labs were done today prior to her office visit LFTs are normal with the exception of ALT at 37 glucose is 133 potassium 3 well creatinine 1.1 WBC 7.5 hemoglobin 10.9 which is stable    Review of Systems  Constitutional: Positive for appetite change and fatigue.  HENT: Positive for trouble swallowing.   Eyes: Negative.   Respiratory: Negative.   Cardiovascular: Negative.   Gastrointestinal: Positive for nausea, vomiting and diarrhea.  Endocrine: Negative.   Genitourinary: Negative.   Musculoskeletal: Negative.   Skin: Negative.   Allergic/Immunologic: Negative.   Neurological: Negative.   Hematological: Negative.   Psychiatric/Behavioral: Negative.    Outpatient Prescriptions Prior to Visit  Medication Sig Dispense Refill  . ACCU-CHEK AVIVA PLUS test strip 1 each by Other route See admin instructions. Check blood sugar 4 times daily.    [name] atenolol (TENORMIN) 100 MG tablet Take 1 tablet (100 mg total) by mouth every morning. 30 tablet 11  . atorvastatin (LIPITOR) 10 MG tablet Take 1 tablet (10 mg total) by mouth daily. 30 tablet 11  . calcitRIOL (ROCALTROL) 0.5 MCG capsule Take 2 capsules (1 mcg total) by mouth daily. 60 capsule 11  . dicyclomine (BENTYL) 20 MG tablet Take 1 tablet (20 mg total) by mouth 2 (two) times daily. 20 tablet 0  . docusate sodium (COLACE) 100 MG capsule Take 100 mg by mouth daily as needed for mild constipation.    [name] glucagon (GLUCAGON EMERGENCY) 1 MG  injection Inject 1 mg into the vein once as needed (for glucose).    . hydrocortisone (CORTEF) 10 MG tablet Take 1 tablet (10 mg total) by mouth 2 (two) times daily. 60 tablet 11  . insulin aspart (NOVOLOG) 100 UNIT/ML FlexPen Inject 0-7 Units into the skin 3 (three) times daily with meals. 70-120 = 0 units, 121-150 = 1 unit, 151-200 = 2 units, 201-250 = 3 units, 251-300 = 5 units, 301-350 = 7 units.      . insulin detemir (LEVEMIR) 100 UNIT/ML injection Inject 8 Units into the skin at bedtime.     [name] isometheptene-acetaminophen-dichloralphenazone (MIDRIN) 65-325-100 MG capsule Take 1 capsule by mouth 4 (four) times daily as needed for migraine. Maximum 5 capsules in 12 hours for migraine headaches, 8 capsules in 24 hours for tension headaches. 40 capsule 1  . loperamide (IMODIUM) 2 MG capsule Take 2-4 mg by mouth as needed for diarrhea or loose stools.     [name] LORazepam (ATIVAN) 1 MG tablet Take 1 tablet (1 mg total) by mouth 3 (three) times daily. 60 tablet 3  . mometasone (NASONEX) 50 MCG/ACT nasal spray Place 2 sprays into both nostrils daily as needed (for congestion).     [name] omeprazole (PRILOSEC) 40 MG capsule Take 1 capsule (40 mg total) by mouth 2 (two) times daily. 60 capsule 11  . ondansetron (ZOFRAN) 4 MG tablet Take 1 tablet (4 mg total) by mouth every 6 (six) hours. 12 tablet 0  . OxyCODONE (OXYCONTIN) 10 mg T12A 12 hr tablet Take 1 tablet (10 mg total) by mouth 2 (two) times daily. 60 tablet 0  . Polyvinyl Alcohol (LIQUID TEARS OP) Place 1-2 drops into both eyes as needed (for dry eyes).    . topiramate (TOPAMAX) 100 MG tablet Take 100 mg by mouth 2 (two) times daily.    [name] triamcinolone (KENALOG) 0.1 % ointment Apply 1 application topically 2 (two) times daily as needed (itching).     . Vortioxetine HBr (BRINTELLIX) 20 MG TABS Take 20 mg by mouth daily.    [name] zolmitriptan (ZOMIG) 5 MG tablet Take 1 tablet (5 mg total) by mouth 2 (two) times daily as needed for migraine. 10 tablet 5  . ciprofloxacin (CIPRO) 500 MG tablet Take 1 tablet (500 mg total) by mouth 2 (two) times daily. 20 tablet 0   No facility-administered medications prior to visit.   Allergies  Allergen Reactions  . Milnacipran Swelling and Other (See Comments)    Headache and hand swelling   . Rizatriptan Benzoate Nausea And Vomiting  . Gabapentin Other (See Comments)    Weak muscles  . Moxifloxacin Other (See Comments)     Unknown  . Sulfonamide Derivatives Rash  . Venlafaxine Other (See Comments)    Unknown   Patient Active Problem List   Diagnosis Date Noted  . Bile duct stricture 01/07/2014  . Bile duct stone 01/07/2014  . Internal hemorrhoids 12/30/2013  . History of breast cancer 12/30/2013  . Convulsions/seizures 10/12/2013  . 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 Abnormality of gait 10/12/2013  . Common bile duct (CBD) stricture 10/05/2013  . Nonspecific elevation of levels of transaminase or lactic acid dehydrogenase (LDH) 10/05/2013  . Protein-calorie malnutrition, severe 10/02/2013  . Dilated bile duct 10/02/2013  . Mental status change 10/01/2013  . Dehydration 10/01/2013  . Productive cough 10/01/2013  . Toxic metabolic encephalopathy 41/74/0814  . Nausea with vomiting 09/10/2013  . UTI (urinary tract infection) 09/10/2013  . Acute bronchitis 07/17/2013  .  Type I (juvenile type) diabetes mellitus with neurological manifestations, not stated as uncontrolled 07/02/2013  . Unspecified constipation 05/15/2013  . Arm wound 08/16/2011  . Opioid dependence 07/17/2011  . Migraine syndrome 07/16/2011  . Breast lump in female 07/16/2011  . Bursitis of left hip 06/05/2011  . Knee pain, left 06/05/2011  . Hypoglycemia 04/24/2011  . Chest wall contusion 03/23/2011  . Blood in stool 10/02/2010  . Wart viral 09/29/2010  . Edema 09/01/2010  . Falls frequently 09/01/2010  . Dizzy spells 09/01/2010  . Barrett's esophagus 08/14/2010  . Personal history of colonic polyps 08/14/2010  . GERD (gastroesophageal reflux disease) 07/18/2010  . HYPERTRICHOSIS 05/19/2010  . MENTAL CONFUSION 11/29/2009  . SCAR 08/29/2009  . TRAUMAT HEMOTHOR W/O MENTION OPEN WOUND IN [facility] 02/16/2009  . CHEST PAIN 02/14/2009  . CLOSED FRACTURE OF MULTIPLE RIBS UNSPECIFIED 02/14/2009  . PNEUMOTHORAX, TRAUMATIC 02/14/2009  . FRACTURE, STERNUM 12/02/2008  . SOMNOLENCE 07/23/2008  . FOOT ULCER 05/25/2008  . OSTEOPOROSIS  04/19/2008  . FRACTURE, RIB, LEFT 04/19/2008  . HYPOKALEMIA 04/13/2008  . CHEST WALL PAIN 04/13/2008  . THRUSH 01/22/2008  . TOBACCO USE DISORDER/SMOKER-SMOKING CESSATION DISCUSSED 09/25/2007  . ANEMIA-NOS 05/09/2007  . ANXIETY 05/09/2007  . COPD 05/09/2007  . Headache(784.0) 05/06/2007  . UNS ADVRS EFF UNS RX MEDICINAL&BIOLOGICAL SBSTNC 05/06/2007  . SINUSITIS, ACUTE 04/11/2007  . DISORDER, ADRENAL NEC 12/11/2006  . VITAMIN D DEFICIENCY 12/11/2006  . Variants of migraine, not elsewhere classified, with intractable migraine, so stated 12/11/2006  . PANCREATITIS, CHRONIC 12/11/2006  . FIBROMYALGIA 12/11/2006  . SYNCOPE 12/11/2006  . FATIGUE 12/11/2006  . DIABETES MELLITUS, TYPE I 08/07/2006  . DEPRESSION 08/07/2006  . HYPERTENSION 08/07/2006  . Personal history of malignant neoplasm of breast 12/10/2005   History  Substance Use Topics  . Smoking status: Current Every Day Smoker -- 1.00 packs/day for 40 years    Types: Cigarettes  . Smokeless tobacco: Never Used     Comment: cutting down to 1/2 ppd  . Alcohol Use: No   family history includes Alzheimer's disease in her sister; COPD in her father; Dementia in her sister; Diabetes in her maternal uncle; Heart attack in her brother and brother; Heart disease in her father; Leukemia in her mother. There is no history of Colon cancer or Colon polyps.     Objective:   Physical Exam  Well-developed older [place] female in no acute distress, accompanied by a caregiver.   blood pressure 146/74 pulse 80 height 5 foot 1 weight 91, weight was 89 at last office visit HEENT nontraumatic normocephalic EOMI PERRLA sclera anicteric, Neck ;supple no JVD, Cardiovascular; regular rate and rhythm with S1-S2 no murmur rub or gallop, Pulmonary ;clear bilaterally, Abdomen; soft nondistended bowel sounds are active no appreciable abdominal tenderness no palpable mass or hepatosplenomegaly, Rectal; exam not done, Extremities; no clubbing cyanosis or edema  skin warm and dry, Psych; mood and affect appropriate        Assessment & Plan:  #1  Complicated 68 year old female status post remote partial pancreatectomy with subsequent development of bile duct stricture she is status post ERCP and stent July 2015 and has now undergone removal of the stent as well as sphincterotomy and removal of common bile duct stones and debris 01/07/2014 bx/brushings show atypia but no definite malignancy #2 3 week history of nausea and vomiting and diarrhea-etiology not clear she has had some antibiotics within the past few months will rule out C. Difficile, also need to consider component of pancreatic insufficiency. Patient  is also narcotic dependent and on multiple medications #3 hypokalemia secondary to nausea vomiting diarrhea will replace #4 history of breast cancer #5 diabetes #6 dementia/memory disorder #7chronic anemia #8 fibromyalgia #9 COPD  Plan; I do not think she needs to be hospitalized at this time labs are reassuring except for hypokalemia. Weight is stable and vitals are within normal range, Patient will push fluids and asked her to broaden her diet, -Use Gatorade Pedialyte etc. Start K Dur 20 mEq twice daily 5 days-we'll need repeat labs next week Check stool for C. Difficile by PCR Check CA-19-9 Start Zofran 8 mg by mouth every 12 hours Refill Bentyl and ask her to use 10 mg 3 times daily Will start empiric Flagyl 250 mg by mouth 4 times daily 10 days, and Florastor one by mouth twice daily 2 weeks  If C. Difficile is negative and she does not respond to empiric course of Flagyl may want to start trial of pancreatic enzyme replacement She has appt with Dr. Deatra Ina next week which is supposed to be for hemorrhoid banding-she needs follow-up of nausea vomiting diarrhea at that time and repeat labs

## 2014-01-14 NOTE — Patient Instructions (Signed)
Please go to the basement level to have your labs drawn and stool study. We sent prescriptions to [facility] [address], [place]. 1. FLorastor probiotic 2. Flagyl 3. Refill on Zofran 8 mg tab.  For nausea.  4. Bentyl ( dicyclomine )  5. K-Dur ( Potassium tablets).   Keep the appointment with Dr. Deatra Ina on 01-21-2014 for the hemorrhoid banding at 10;15 am.

## 2014-01-15 ENCOUNTER — Other Ambulatory Visit: Payer: Self-pay

## 2014-01-15 DIAGNOSIS — E876 Hypokalemia: Secondary | ICD-10-CM

## 2014-01-15 LAB — CLOSTRIDIUM DIFFICILE BY PCR: CDIFFPCR: NOT DETECTED

## 2014-01-15 LAB — CANCER ANTIGEN 19-9: CA 19 9: 24.8 U/mL (ref <35.0)

## 2014-01-15 NOTE — Progress Notes (Signed)
Reviewed and agree with management. Xiara Knisley D. Thomasena Vandenheuvel, M.D., FACG  

## 2014-01-18 ENCOUNTER — Telehealth: Payer: Self-pay | Admitting: Gastroenterology

## 2014-01-18 ENCOUNTER — Other Ambulatory Visit: Payer: Self-pay | Admitting: Physician Assistant

## 2014-01-18 NOTE — Telephone Encounter (Signed)
The phone number on file is not a working number. If she calls back, would you confirm her number for me? I called her care giver, [name].

## 2014-01-19 NOTE — Telephone Encounter (Signed)
I did call and ask the patient if she got her potassium tablets.She said she did and is still taking them.  1 tab twice daily for 5 days.  I asked her about the appt that was  For the banding with Dr Deatra Ina for 01-22-11 that was cancelled . ( She told me to talk to her caregiver, Marianne Sofia.)

## 2014-01-19 NOTE — Telephone Encounter (Signed)
I advised Lindsey Gomez the caregiver for Lindsey Gomez that Beth the nurse here for Dr. Deatra Ina will be calling them with another appt for the hem banding soon..I explained that Dr. Deatra Ina had a family emergency and had to go out of town unexpectedly.I did ask her to please bring [name] to our lab tomorrow though. We still need her to have labs.

## 2014-01-21 ENCOUNTER — Other Ambulatory Visit (INDEPENDENT_AMBULATORY_CARE_PROVIDER_SITE_OTHER): Payer: Medicare Other

## 2014-01-21 ENCOUNTER — Encounter: Payer: Medicare Other | Admitting: Gastroenterology

## 2014-01-21 DIAGNOSIS — E876 Hypokalemia: Secondary | ICD-10-CM

## 2014-01-21 LAB — COMPREHENSIVE METABOLIC PANEL
ALBUMIN: 2.8 g/dL — AB (ref 3.5–5.2)
ALT: 26 U/L (ref 0–35)
AST: 18 U/L (ref 0–37)
Alkaline Phosphatase: 85 U/L (ref 39–117)
BUN: 19 mg/dL (ref 6–23)
CALCIUM: 10 mg/dL (ref 8.4–10.5)
CHLORIDE: 104 meq/L (ref 96–112)
CO2: 28 mEq/L (ref 19–32)
Creatinine, Ser: 1.1 mg/dL (ref 0.4–1.2)
GFR: 51.09 mL/min — ABNORMAL LOW (ref >60.00)
Glucose, Bld: 250 mg/dL — ABNORMAL HIGH (ref 70–99)
POTASSIUM: 3.7 meq/L (ref 3.5–5.1)
Sodium: 137 mEq/L (ref 135–145)
Total Bilirubin: 0.3 mg/dL (ref 0.2–1.2)
Total Protein: 6.2 g/dL (ref 6.0–8.3)

## 2014-01-28 ENCOUNTER — Ambulatory Visit (INDEPENDENT_AMBULATORY_CARE_PROVIDER_SITE_OTHER): Payer: Medicare Other | Admitting: Gastroenterology

## 2014-01-28 ENCOUNTER — Encounter: Payer: Self-pay | Admitting: Gastroenterology

## 2014-01-28 VITALS — BP 134/82 | HR 76 | Ht 61.0 in | Wt 86.1 lb

## 2014-01-28 DIAGNOSIS — K648 Other hemorrhoids: Secondary | ICD-10-CM

## 2014-01-28 DIAGNOSIS — R197 Diarrhea, unspecified: Secondary | ICD-10-CM

## 2014-01-28 NOTE — Patient Instructions (Addendum)
HEMORRHOID BANDING PROCEDURE    FOLLOW-UP CARE   1. The procedure you have had should have been relatively painless since the banding of the area involved does not have nerve endings and there is no pain sensation.  The rubber band cuts off the blood supply to the hemorrhoid and the band may fall off as soon as 48 hours after the banding (the band may occasionally be seen in the toilet bowl following a bowel movement). You may notice a temporary feeling of fullness in the rectum which should respond adequately to plain Tylenol or Motrin.  2. Following the banding, avoid strenuous exercise that evening and resume full activity the next day.  A sitz bath (soaking in a warm tub) or bidet is soothing, and can be useful for cleansing the area after bowel movements.     3. To avoid constipation, take two tablespoons of natural wheat bran, natural oat bran, flax, Benefiber or any over the counter fiber supplement and increase your water intake to 7-8 glasses daily.    4. Unless you have been prescribed anorectal medication, do not put anything inside your rectum for two weeks: No suppositories, enemas, fingers, etc.  5. Occasionally, you may have more bleeding than usual after the banding procedure.  This is often from the untreated hemorrhoids rather than the treated one.  Don't be concerned if there is a tablespoon or so of blood.  If there is more blood than this, lie flat with your bottom higher than your head and apply an ice pack to the area. If the bleeding does not stop within a half an hour or if you feel faint, call our office at [phone] or go to the emergency room.  6. Problems are not common; however, if there is a substantial amount of bleeding, severe pain, chills, fever or difficulty passing urine (very rare) or other problems, you should call us at [phone] or report to the nearest emergency room.  7. Do not stay seated continuously for more than 2-3 hours for a day or two  after the procedure.  Tighten your buttock muscles 10-15 times every two hours and take 10-15 deep breaths every 1-2 hours.  Do not spend more than a few minutes on the toilet if you cannot empty your bowel; instead re-visit the toilet at a later time.   Your next banding is scheduled on 04/01/2014 at [facility] samples of Pancreatic Enzymes Zenpep  Go to the basement for labs

## 2014-01-28 NOTE — Progress Notes (Signed)
      History of Present Illness:  Lindsey Gomez has returned for hemorrhoidal banding in assessment of diarrhea.  Diarrhea has not improved despite taking Flagyl and Florastor.  Nausea has subsided.    Review of Systems: Pertinent positive and negative review of systems were noted in the above HPI section. All other review of systems were otherwise negative.    Current Medications, Allergies, Past Medical History, Past Surgical History, Family History and Social History were reviewed in [name] record  Vital signs were reviewed in today's medical record. Physical Exam: General: Chronically ill-appearing in no acute distress Skin: anicteric Head: Normocephalic and atraumatic Eyes:  sclerae anicteric, EOMI Ears: Normal auditory acuity Mouth: No deformity or lesions Lungs: Clear throughout to auscultation Heart: Regular rate and rhythm; no murmurs, rubs or bruits Abdomen: Soft, non tender and non distended. No masses, hepatosplenomegaly or hernias noted. Normal Bowel sounds Rectal: Grade 3 hemorrhoids; Hemoccult negative Musculoskeletal: Symmetrical with no gross deformities  Pulses:  Normal pulses noted Extremities: No clubbing, cyanosis, edema or deformities noted Neurological: Alert oriented x 4, grossly nonfocal Psychological:  Alert and cooperative. Normal mood and affect  PROCEDURE NOTE: The patient presents with symptomatic grade *3**  hemorrhoids, requesting rubber band ligation of his/her hemorrhoidal disease.  All risks, benefits and alternative forms of therapy were described and informed consent was obtained.   The anorectum was pre-medicated with lubricant and nitroglycerine ointment The decision was made to band the **right posterior* internal hemorrhoid, and the Hackberry was used to perform band ligation without complication.  Digital anorectal examination was then performed to assure proper positioning of the band, and to adjust the  banded tissue as required.  The patient was discharged home without pain or other issues.  Dietary and behavioral recommendations were given and along with follow-up instructions.    The patient will return in *2** weeks for  follow-up and possible additional banding as required. No complications were encountered and the patient tolerated the procedure well.    See Assessment and Plan under Problem List

## 2014-01-28 NOTE — Assessment & Plan Note (Signed)
patient is here for hemorrhoidal banding because of symptomatic hemorrhoids consisting of protrusion and bleeding.  She continues to complain of loose stools.  C. difficile toxin was negative.  Empiric therapy with antibiotics was unsuccessful.  She no longer has nausea.  Stools tested Hemoccult negative.  Recommendations #1 check stool elastase, lactoferrin and pathogen panel #2 empiric therapy with pancreatic enzyme replacement

## 2014-01-29 ENCOUNTER — Other Ambulatory Visit: Payer: Medicare Other

## 2014-01-29 DIAGNOSIS — R197 Diarrhea, unspecified: Secondary | ICD-10-CM

## 2014-01-30 LAB — FECAL LACTOFERRIN, QUANT: LACTOFERRIN: POSITIVE

## 2014-02-01 LAB — GASTROINTESTINAL PATHOGEN PANEL PCR
C. difficile Tox A/B, PCR: NEGATIVE
CRYPTOSPORIDIUM, PCR: NEGATIVE
Campylobacter, PCR: NEGATIVE
E coli (ETEC) LT/ST PCR: NEGATIVE
E coli (STEC) stx1/stx2, PCR: NEGATIVE
E coli 0157, PCR: NEGATIVE
GIARDIA LAMBLIA, PCR: NEGATIVE
NOROVIRUS, PCR: NEGATIVE
Rotavirus A, PCR: NEGATIVE
SHIGELLA, PCR: NEGATIVE
Salmonella, PCR: NEGATIVE

## 2014-02-08 ENCOUNTER — Other Ambulatory Visit: Payer: Self-pay | Admitting: Physician Assistant

## 2014-02-09 ENCOUNTER — Other Ambulatory Visit: Payer: Self-pay

## 2014-02-10 ENCOUNTER — Telehealth: Payer: Self-pay | Admitting: Gastroenterology

## 2014-02-10 MED ORDER — ONDANSETRON HCL 8 MG PO TABS: 8.0000 mg | ORAL_TABLET | Freq: Two times a day (BID) | ORAL | Status: DC

## 2014-02-10 NOTE — Telephone Encounter (Signed)
MED SENT TRIED TO CALL PATIENT BUT GOT HUNG UP ON

## 2014-02-12 LAB — PANCREATIC ELASTASE, FECAL: PANCREATIC ELASTASE-1, STL: 435 ug/g

## 2014-03-11 ENCOUNTER — Inpatient Hospital Stay (HOSPITAL_COMMUNITY)
Admission: EM | Admit: 2014-03-11 | Discharge: 2014-03-15 | DRG: 638 | Disposition: A | Discharge status: Home / Self Care | Payer: Medicare Other | Attending: Internal Medicine | Admitting: Internal Medicine

## 2014-03-11 ENCOUNTER — Encounter (HOSPITAL_COMMUNITY): Payer: Self-pay | Admitting: Family Medicine

## 2014-03-11 DIAGNOSIS — F329 Major depressive disorder, single episode, unspecified: Secondary | ICD-10-CM | POA: Diagnosis present

## 2014-03-11 DIAGNOSIS — D696 Thrombocytopenia, unspecified: Secondary | ICD-10-CM | POA: Diagnosis present

## 2014-03-11 DIAGNOSIS — Z66 Do not resuscitate: Secondary | ICD-10-CM | POA: Diagnosis present

## 2014-03-11 DIAGNOSIS — M797 Fibromyalgia: Secondary | ICD-10-CM | POA: Diagnosis present

## 2014-03-11 DIAGNOSIS — Z90710 Acquired absence of both cervix and uterus: Secondary | ICD-10-CM

## 2014-03-11 DIAGNOSIS — K861 Other chronic pancreatitis: Secondary | ICD-10-CM | POA: Diagnosis present

## 2014-03-11 DIAGNOSIS — D509 Iron deficiency anemia, unspecified: Secondary | ICD-10-CM | POA: Diagnosis present

## 2014-03-11 DIAGNOSIS — Z888 Allergy status to other drugs, medicaments and biological substances status: Secondary | ICD-10-CM

## 2014-03-11 DIAGNOSIS — J449 Chronic obstructive pulmonary disease, unspecified: Secondary | ICD-10-CM | POA: Diagnosis present

## 2014-03-11 DIAGNOSIS — Z853 Personal history of malignant neoplasm of breast: Secondary | ICD-10-CM

## 2014-03-11 DIAGNOSIS — N179 Acute kidney failure, unspecified: Secondary | ICD-10-CM | POA: Diagnosis present

## 2014-03-11 DIAGNOSIS — E876 Hypokalemia: Secondary | ICD-10-CM | POA: Diagnosis not present

## 2014-03-11 DIAGNOSIS — Z87891 Personal history of nicotine dependence: Secondary | ICD-10-CM | POA: Diagnosis not present

## 2014-03-11 DIAGNOSIS — Z882 Allergy status to sulfonamides status: Secondary | ICD-10-CM | POA: Diagnosis not present

## 2014-03-11 DIAGNOSIS — Z9842 Cataract extraction status, left eye: Secondary | ICD-10-CM | POA: Diagnosis not present

## 2014-03-11 DIAGNOSIS — K5900 Constipation, unspecified: Secondary | ICD-10-CM | POA: Diagnosis present

## 2014-03-11 DIAGNOSIS — I10 Essential (primary) hypertension: Secondary | ICD-10-CM | POA: Diagnosis present

## 2014-03-11 DIAGNOSIS — E1100 Type 2 diabetes mellitus with hyperosmolarity without nonketotic hyperglycemic-hyperosmolar coma (NKHHC): Principal | ICD-10-CM | POA: Diagnosis present

## 2014-03-11 DIAGNOSIS — E871 Hypo-osmolality and hyponatremia: Secondary | ICD-10-CM | POA: Diagnosis present

## 2014-03-11 DIAGNOSIS — F419 Anxiety disorder, unspecified: Secondary | ICD-10-CM | POA: Diagnosis present

## 2014-03-11 DIAGNOSIS — Z720 Tobacco use: Secondary | ICD-10-CM

## 2014-03-11 DIAGNOSIS — G43909 Migraine, unspecified, not intractable, without status migrainosus: Secondary | ICD-10-CM | POA: Diagnosis present

## 2014-03-11 DIAGNOSIS — F17200 Nicotine dependence, unspecified, uncomplicated: Secondary | ICD-10-CM | POA: Diagnosis present

## 2014-03-11 DIAGNOSIS — Z9049 Acquired absence of other specified parts of digestive tract: Secondary | ICD-10-CM | POA: Diagnosis present

## 2014-03-11 DIAGNOSIS — E860 Dehydration: Secondary | ICD-10-CM | POA: Diagnosis present

## 2014-03-11 DIAGNOSIS — F1120 Opioid dependence, uncomplicated: Secondary | ICD-10-CM | POA: Diagnosis present

## 2014-03-11 DIAGNOSIS — Z881 Allergy status to other antibiotic agents status: Secondary | ICD-10-CM

## 2014-03-11 DIAGNOSIS — R519 Headache, unspecified: Secondary | ICD-10-CM

## 2014-03-11 DIAGNOSIS — K2270 Barrett's esophagus without dysplasia: Secondary | ICD-10-CM | POA: Diagnosis present

## 2014-03-11 DIAGNOSIS — E119 Type 2 diabetes mellitus without complications: Secondary | ICD-10-CM

## 2014-03-11 DIAGNOSIS — Z9841 Cataract extraction status, right eye: Secondary | ICD-10-CM

## 2014-03-11 DIAGNOSIS — K219 Gastro-esophageal reflux disease without esophagitis: Secondary | ICD-10-CM | POA: Diagnosis present

## 2014-03-11 DIAGNOSIS — R51 Headache: Secondary | ICD-10-CM

## 2014-03-11 DIAGNOSIS — N200 Calculus of kidney: Secondary | ICD-10-CM | POA: Diagnosis present

## 2014-03-11 DIAGNOSIS — Z90411 Acquired partial absence of pancreas: Secondary | ICD-10-CM | POA: Diagnosis present

## 2014-03-11 DIAGNOSIS — R109 Unspecified abdominal pain: Secondary | ICD-10-CM

## 2014-03-11 DIAGNOSIS — N390 Urinary tract infection, site not specified: Secondary | ICD-10-CM | POA: Diagnosis present

## 2014-03-11 LAB — URINE MICROSCOPIC-ADD ON

## 2014-03-11 LAB — COMPREHENSIVE METABOLIC PANEL
ALT: 31 U/L (ref 0–35)
AST: 23 U/L (ref 0–37)
Albumin: 3.4 g/dL — ABNORMAL LOW (ref 3.5–5.2)
Alkaline Phosphatase: 94 U/L (ref 39–117)
Anion gap: 14 (ref 5–15)
BILIRUBIN TOTAL: 0.6 mg/dL (ref 0.3–1.2)
BUN: 75 mg/dL — ABNORMAL HIGH (ref 6–23)
CHLORIDE: 90 meq/L — AB (ref 96–112)
CO2: 23 mmol/L (ref 19–32)
CREATININE: 4.02 mg/dL — AB (ref 0.50–1.10)
Calcium: 14.5 mg/dL (ref 8.4–10.5)
GFR calc Af Amer: 12 mL/min — ABNORMAL LOW (ref >90)
GFR, EST NON AFRICAN AMERICAN: 11 mL/min — AB (ref >90)
Glucose, Bld: 426 mg/dL — ABNORMAL HIGH (ref 70–99)
Potassium: 5 mmol/L (ref 3.5–5.1)
Sodium: 127 mmol/L — ABNORMAL LOW (ref 135–145)
Total Protein: 6.7 g/dL (ref 6.0–8.3)

## 2014-03-11 LAB — CBG MONITORING, ED
GLUCOSE-CAPILLARY: 391 mg/dL — AB (ref 70–99)
Glucose-Capillary: 353 mg/dL — ABNORMAL HIGH (ref 70–99)

## 2014-03-11 LAB — GLUCOSE, CAPILLARY
GLUCOSE-CAPILLARY: 243 mg/dL — AB (ref 70–99)
GLUCOSE-CAPILLARY: 256 mg/dL — AB (ref 70–99)
Glucose-Capillary: 294 mg/dL — ABNORMAL HIGH (ref 70–99)
Glucose-Capillary: 287 mg/dL — ABNORMAL HIGH (ref 70–99)
Glucose-Capillary: 277 mg/dL — ABNORMAL HIGH (ref 70–99)

## 2014-03-11 LAB — CBC WITH DIFFERENTIAL/PLATELET
BASOS ABS: 0 10*3/uL (ref 0.0–0.1)
Basophils Relative: 0 % (ref 0–1)
Eosinophils Absolute: 0.2 10*3/uL (ref 0.0–0.7)
Eosinophils Relative: 2 % (ref 0–5)
HCT: 35.1 % — ABNORMAL LOW (ref 36.0–46.0)
HEMOGLOBIN: 11.9 g/dL — AB (ref 12.0–15.0)
Lymphocytes Relative: 20 % (ref 12–46)
Lymphs Abs: 1.9 10*3/uL (ref 0.7–4.0)
MCH: 30.4 pg (ref 26.0–34.0)
MCHC: 33.9 g/dL (ref 30.0–36.0)
MCV: 89.5 fL (ref 78.0–100.0)
Monocytes Absolute: 0.6 10*3/uL (ref 0.1–1.0)
Monocytes Relative: 6 % (ref 3–12)
Neutro Abs: 6.8 10*3/uL (ref 1.7–7.7)
Neutrophils Relative %: 72 % (ref 43–77)
Platelets: 153 10*3/uL (ref 150–400)
RBC: 3.92 MIL/uL (ref 3.87–5.11)
RDW: 15.5 % (ref 11.5–15.5)
WBC: 9.5 10*3/uL (ref 4.0–10.5)

## 2014-03-11 LAB — URINALYSIS, ROUTINE W REFLEX MICROSCOPIC
Bilirubin Urine: NEGATIVE
GLUCOSE, UA: 100 mg/dL — AB
Hgb urine dipstick: NEGATIVE
KETONES UR: NEGATIVE mg/dL
Nitrite: NEGATIVE
PROTEIN: NEGATIVE mg/dL
SPECIFIC GRAVITY, URINE: 1.011 (ref 1.005–1.030)
UROBILINOGEN UA: 0.2 mg/dL (ref 0.0–1.0)
pH: 7.5 (ref 5.0–8.0)

## 2014-03-11 LAB — SODIUM, URINE, RANDOM: Sodium, Ur: 44 mmol/L

## 2014-03-11 LAB — CREATININE, URINE, RANDOM: Creatinine, Urine: 21.1 mg/dL

## 2014-03-11 MED ORDER — TOPIRAMATE 100 MG PO TABS
100.0000 mg | ORAL_TABLET | Freq: Two times a day (BID) | ORAL | Status: DC
  Administered 2014-03-11 – 2014-03-15 (×8): 100 mg via ORAL
  Filled 2014-03-11 (×9): qty 1

## 2014-03-11 MED ORDER — SUMATRIPTAN SUCCINATE 50 MG PO TABS
50.0000 mg | ORAL_TABLET | Freq: Once | ORAL | Status: AC
  Administered 2014-03-11: 50 mg via ORAL
  Filled 2014-03-11: qty 1

## 2014-03-11 MED ORDER — VORTIOXETINE HBR 20 MG PO TABS
20.0000 mg | ORAL_TABLET | Freq: Every day | ORAL | Status: DC
Start: 2014-03-12 — End: 2014-03-15
  Administered 2014-03-12 – 2014-03-15 (×4): 20 mg via ORAL
  Filled 2014-03-11 (×5): qty 20

## 2014-03-11 MED ORDER — DEXTROSE 50 % IV SOLN
25.0000 mL | INTRAVENOUS | Status: DC | PRN
  Administered 2014-03-12: 25 mL via INTRAVENOUS
  Filled 2014-03-11: qty 50

## 2014-03-11 MED ORDER — SODIUM CHLORIDE 0.9 % IV SOLN
INTRAVENOUS | Status: DC
  Administered 2014-03-11: 2.9 [IU]/h via INTRAVENOUS
  Filled 2014-03-11: qty 2.5

## 2014-03-11 MED ORDER — ATORVASTATIN CALCIUM 10 MG PO TABS
10.0000 mg | ORAL_TABLET | Freq: Every day | ORAL | Status: DC
  Administered 2014-03-12 – 2014-03-15 (×4): 10 mg via ORAL
  Filled 2014-03-11 (×4): qty 1

## 2014-03-11 MED ORDER — PROCHLORPERAZINE EDISYLATE 5 MG/ML IJ SOLN
10.0000 mg | Freq: Four times a day (QID) | INTRAMUSCULAR | Status: DC | PRN
  Administered 2014-03-11 – 2014-03-13 (×3): 10 mg via INTRAVENOUS
  Filled 2014-03-11 (×4): qty 2

## 2014-03-11 MED ORDER — INSULIN DETEMIR 100 UNIT/ML ~~LOC~~ SOLN
10.0000 [IU] | Freq: Every day | SUBCUTANEOUS | Status: DC
  Administered 2014-03-12: 10 [IU] via SUBCUTANEOUS
  Filled 2014-03-11 (×3): qty 0.1

## 2014-03-11 MED ORDER — NICOTINE 7 MG/24HR TD PT24
7.0000 mg | MEDICATED_PATCH | TRANSDERMAL | Status: DC
  Administered 2014-03-12 – 2014-03-15 (×4): 7 mg via TRANSDERMAL
  Filled 2014-03-11 (×5): qty 1

## 2014-03-11 MED ORDER — SODIUM CHLORIDE 0.9 % IJ SOLN
3.0000 mL | Freq: Two times a day (BID) | INTRAMUSCULAR | Status: DC
  Administered 2014-03-12: 3 mL via INTRAVENOUS

## 2014-03-11 MED ORDER — SACCHAROMYCES BOULARDII 250 MG PO CAPS
250.0000 mg | ORAL_CAPSULE | Freq: Two times a day (BID) | ORAL | Status: DC
  Administered 2014-03-11 – 2014-03-15 (×8): 250 mg via ORAL
  Filled 2014-03-11 (×9): qty 1

## 2014-03-11 MED ORDER — LORAZEPAM 1 MG PO TABS
1.0000 mg | ORAL_TABLET | Freq: Three times a day (TID) | ORAL | Status: DC
  Administered 2014-03-11 – 2014-03-15 (×11): 1 mg via ORAL
  Filled 2014-03-11 (×11): qty 1

## 2014-03-11 MED ORDER — ALUM & MAG HYDROXIDE-SIMETH 200-200-20 MG/5ML PO SUSP: 30.0000 mL | Freq: Four times a day (QID) | ORAL | Status: DC | PRN

## 2014-03-11 MED ORDER — LOPERAMIDE HCL 2 MG PO CAPS
2.0000 mg | ORAL_CAPSULE | ORAL | Status: DC | PRN
  Filled 2014-03-11: qty 2

## 2014-03-11 MED ORDER — CALCITRIOL 0.5 MCG PO CAPS
1.0000 ug | ORAL_CAPSULE | Freq: Every day | ORAL | Status: DC
  Administered 2014-03-12 – 2014-03-15 (×4): 1 ug via ORAL
  Filled 2014-03-11 (×4): qty 2

## 2014-03-11 MED ORDER — SODIUM CHLORIDE 0.9 % IV SOLN
INTRAVENOUS | Status: DC
  Administered 2014-03-11: 17:00:00 via INTRAVENOUS

## 2014-03-11 MED ORDER — HYDROCORTISONE 10 MG PO TABS
10.0000 mg | ORAL_TABLET | Freq: Two times a day (BID) | ORAL | Status: DC
  Administered 2014-03-11 – 2014-03-15 (×8): 10 mg via ORAL
  Filled 2014-03-11 (×9): qty 1

## 2014-03-11 MED ORDER — ATENOLOL 100 MG PO TABS
100.0000 mg | ORAL_TABLET | Freq: Every day | ORAL | Status: DC
  Administered 2014-03-12 – 2014-03-15 (×4): 100 mg via ORAL
  Filled 2014-03-11 (×4): qty 1

## 2014-03-11 MED ORDER — FERROUS SULFATE 325 (65 FE) MG PO TABS
325.0000 mg | ORAL_TABLET | Freq: Every day | ORAL | Status: DC
  Administered 2014-03-12 – 2014-03-15 (×4): 325 mg via ORAL
  Filled 2014-03-11 (×5): qty 1

## 2014-03-11 MED ORDER — SODIUM CHLORIDE 0.9 % IV SOLN: INTRAVENOUS | Status: DC

## 2014-03-11 MED ORDER — HEPARIN SODIUM (PORCINE) 5000 UNIT/ML IJ SOLN
5000.0000 [IU] | Freq: Three times a day (TID) | INTRAMUSCULAR | Status: DC
  Administered 2014-03-11 – 2014-03-15 (×10): 5000 [IU] via SUBCUTANEOUS
  Filled 2014-03-11 (×12): qty 1

## 2014-03-11 MED ORDER — PANTOPRAZOLE SODIUM 40 MG PO TBEC
40.0000 mg | DELAYED_RELEASE_TABLET | Freq: Every day | ORAL | Status: DC
  Administered 2014-03-12 – 2014-03-15 (×4): 40 mg via ORAL
  Filled 2014-03-11 (×4): qty 1

## 2014-03-11 MED ORDER — INSULIN REGULAR BOLUS VIA INFUSION
0.0000 [IU] | Freq: Three times a day (TID) | INTRAVENOUS | Status: DC
  Filled 2014-03-11: qty 10

## 2014-03-11 MED ORDER — DEXAMETHASONE SODIUM PHOSPHATE 10 MG/ML IJ SOLN
10.0000 mg | Freq: Once | INTRAMUSCULAR | Status: AC
  Administered 2014-03-11: 10 mg via INTRAVENOUS
  Filled 2014-03-11: qty 1

## 2014-03-11 MED ORDER — ONDANSETRON HCL 4 MG/2ML IJ SOLN
4.0000 mg | Freq: Four times a day (QID) | INTRAMUSCULAR | Status: DC | PRN
  Administered 2014-03-14: 4 mg via INTRAVENOUS
  Filled 2014-03-11: qty 2

## 2014-03-11 MED ORDER — DIPHENHYDRAMINE HCL 50 MG/ML IJ SOLN
25.0000 mg | Freq: Once | INTRAMUSCULAR | Status: AC
  Administered 2014-03-11: 25 mg via INTRAVENOUS
  Filled 2014-03-11: qty 1

## 2014-03-11 MED ORDER — ONDANSETRON HCL 4 MG PO TABS
4.0000 mg | ORAL_TABLET | Freq: Four times a day (QID) | ORAL | Status: DC | PRN
  Administered 2014-03-13 – 2014-03-15 (×4): 4 mg via ORAL
  Filled 2014-03-11 (×4): qty 1

## 2014-03-11 MED ORDER — POLYETHYLENE GLYCOL 3350 17 G PO PACK
17.0000 g | PACK | Freq: Every day | ORAL | Status: DC | PRN
  Administered 2014-03-13: 17 g via ORAL
  Filled 2014-03-11 (×2): qty 1

## 2014-03-11 MED ORDER — SODIUM CHLORIDE 0.9 % IV BOLUS (SEPSIS)
1000.0000 mL | Freq: Once | INTRAVENOUS | Status: AC
Start: 2014-03-11 — End: 2014-03-11
  Administered 2014-03-11: 1000 mL via INTRAVENOUS

## 2014-03-11 MED ORDER — DICYCLOMINE HCL 10 MG PO CAPS
10.0000 mg | ORAL_CAPSULE | Freq: Three times a day (TID) | ORAL | Status: DC
  Administered 2014-03-12 – 2014-03-15 (×11): 10 mg via ORAL
  Filled 2014-03-11 (×13): qty 1

## 2014-03-11 MED ORDER — HYDROCODONE-ACETAMINOPHEN 5-325 MG PO TABS
1.0000 | ORAL_TABLET | ORAL | Status: DC | PRN
  Administered 2014-03-12: 1 via ORAL
  Filled 2014-03-11: qty 1

## 2014-03-11 MED ORDER — GUAIFENESIN-DM 100-10 MG/5ML PO SYRP
5.0000 mL | ORAL_SOLUTION | ORAL | Status: DC | PRN
  Filled 2014-03-11: qty 5

## 2014-03-11 MED ORDER — TRIAMCINOLONE ACETONIDE 0.1 % EX OINT
1.0000 "application " | TOPICAL_OINTMENT | Freq: Two times a day (BID) | CUTANEOUS | Status: DC | PRN
  Filled 2014-03-11: qty 15

## 2014-03-11 MED ORDER — DOCUSATE SODIUM 100 MG PO CAPS: 100.0000 mg | ORAL_CAPSULE | Freq: Every day | ORAL | Status: DC | PRN

## 2014-03-11 NOTE — ED Provider Notes (Signed)
CSN: [id number]     Arrival date & time 03/11/14  1255 History   None    Chief Complaint  Patient presents with  . Abnormal Lab     (Consider location/radiation/quality/duration/timing/severity/associated sxs/prior Treatment) Patient is a 68 y.o. female presenting with headaches. The history is provided by the patient and a relative.  Headache Pain location:  Frontal Quality:  Dull Radiates to:  Does not radiate Severity currently:  8/10 Severity at highest:  8/10 Onset quality:  Gradual (time from onset to maximum pain over 1 hour) Duration:  1 day Timing:  Constant Progression:  Worsening Chronicity:  Recurrent Similar to prior headaches: yes ("exactly the same" per patient)   Relieved by:  None tried Worsened by:  Activity and light Ineffective treatments:  None tried Associated symptoms: fatigue and myalgias   Associated symptoms: no abdominal pain, no back pain, no cough, no dizziness, no pain, no fever, no nausea, no photophobia, no sore throat and no vomiting ("spitting up" no emesis)     Past Medical History  Diagnosis Date  . Depression   . Type II or unspecified type diabetes mellitus without mention of complication, not stated as uncontrolled   . HTN (hypertension)   . Chronic pain   . Fibromyalgia   . Migraines   . History of breast cancer     Right  . Chronic pancreatitis   . Vitamin D deficiency   . GERD (gastroesophageal reflux disease)   . Finger dislocation 2009    L 3rd  . COPD (chronic obstructive pulmonary disease)   . Anemia   . Fractured sternum 11/2008  . Osteoporosis     Reclast too expensive  . Barrett's esophagus   . Chronic fatigue   . Diverticulosis   . Opioid dependence   . 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 Abnormality of gait 10/12/2013  . Anxiety   . Depression   . Diabetes   . Convulsions/seizures 10/12/2013    Possible benzodiazepine withdrawal seizure, x1-no further seizure activity   . Cancer     HX OF BREAST CANCER -chemo,  radiation   Past Surgical History  Procedure Laterality Date  . Nasal sinus surgery    . Pancreatectomy      40%  . Cholecystectomy    . Appendectomy    . Abdominal hysterectomy    . Lobectomy Left 2005    granulomatous lesion.   . Breast lumpectomy Left   . Ercp N/A 10/05/2013    Procedure: ENDOSCOPIC RETROGRADE CHOLANGIOPANCREATOGRAPHY (ERCP);  Surgeon: Ladene Artist, MD;  Location: [facility] ENDOSCOPY;  Service: Endoscopy;  Laterality: N/A;  . Cataract extraction Bilateral   . Botox injection      recent botox injection 12-23-13 injection for Migraines  . Cataract extraction, bilateral Bilateral     bilateral  . Eye surgery      1 eye "membrane surgery"  . Ercp N/A 01/07/2014    Procedure: ENDOSCOPIC RETROGRADE CHOLANGIOPANCREATOGRAPHY (ERCP);  Surgeon: Inda Castle, MD;  Location: [facility] ENDOSCOPY;  Service: Endoscopy;  Laterality: N/A;  . Spyglass cholangioscopy N/A 01/07/2014    Procedure: NTIRWERX CHOLANGIOSCOPY;  Surgeon: Inda Castle, MD;  Location: [facility] ENDOSCOPY;  Service: Endoscopy;  Laterality: N/A;   Family History  Problem Relation Age of Onset  . Heart attack Brother   . COPD Father   . Heart disease Father   . Leukemia Mother   . Diabetes Maternal Uncle   . Dementia Sister   . Alzheimer's disease Sister   .  Heart attack Brother   . Colon cancer Neg Hx   . Colon polyps Neg Hx    History  Substance Use Topics  . Smoking status: Former Smoker -- 1.00 packs/day for 40 years    Types: Cigarettes    Quit date: 01/25/2014  . Smokeless tobacco: Never Used     Comment: started patch 01/25/14  . Alcohol Use: No   OB History    No data available     Review of Systems  Constitutional: Positive for appetite change (decreased recently) and fatigue. Negative for fever, chills, diaphoresis and activity change.  HENT: Negative for facial swelling, rhinorrhea, sore throat, trouble swallowing and voice change.   Eyes: Negative for photophobia, pain and visual  disturbance.  Respiratory: Negative for cough, shortness of breath, wheezing and stridor.   Cardiovascular: Negative for chest pain, palpitations and leg swelling.  Gastrointestinal: Negative for nausea, vomiting ("spitting up" no emesis), abdominal pain, constipation and anal bleeding.  Endocrine: Negative.   Genitourinary: Negative for dysuria, vaginal bleeding, vaginal discharge and vaginal pain.  Musculoskeletal: Positive for myalgias. Negative for back pain and arthralgias.  Skin: Negative.  Negative for rash.  Allergic/Immunologic: Negative.   Neurological: Positive for headaches. Negative for dizziness, tremors, syncope and weakness.  Psychiatric/Behavioral: Negative for suicidal ideas, sleep disturbance and self-injury.  All other systems reviewed and are negative.     Allergies  Milnacipran; Rizatriptan benzoate; Gabapentin; Moxifloxacin; Sulfonamide derivatives; and Venlafaxine  Home Medications   Prior to Admission medications   Medication Sig Start Date End Date Taking? Authorizing Provider  ACCU-CHEK AVIVA PLUS test strip 1 each by Other route See admin instructions. Check blood sugar 4 times daily. 10/08/13   Historical Provider, MD  atenolol (TENORMIN) 100 MG tablet Take 1 tablet (100 mg total) by mouth every morning. 05/15/13   Aleksei Plotnikov V, MD  atorvastatin (LIPITOR) 10 MG tablet Take 1 tablet (10 mg total) by mouth daily. 05/15/13 05/18/14  Aleksei Plotnikov V, MD  calcitRIOL (ROCALTROL) 0.5 MCG capsule Take 2 capsules (1 mcg total) by mouth daily. 05/15/13   Aleksei Plotnikov V, MD  dicyclomine (BENTYL) 10 MG capsule Take 1 capsule (10 mg total) by mouth 3 (three) times daily before meals. 01/14/14   Amy S Esterwood, PA-C  docusate sodium (COLACE) 100 MG capsule Take 100 mg by mouth daily as needed for mild constipation.    Historical Provider, MD  ferrous sulfate 325 (65 FE) MG tablet Take 325 mg by mouth daily with breakfast.    Historical Provider, MD  glucagon  (GLUCAGON EMERGENCY) 1 MG injection Inject 1 mg into the vein once as needed (for glucose).    Historical Provider, MD  hydrocortisone (CORTEF) 10 MG tablet Take 1 tablet (10 mg total) by mouth 2 (two) times daily. 05/15/13   Aleksei Plotnikov V, MD  insulin aspart (NOVOLOG) 100 UNIT/ML FlexPen Inject 0-7 Units into the skin 3 (three) times daily with meals. 70-120 = 0 units, 121-150 = 1 unit, 151-200 = 2 units, 201-250 = 3 units, 251-300 = 5 units, 301-350 = 7 units. 01/13/13   Renato Shin, MD  insulin detemir (LEVEMIR) 100 UNIT/ML injection Inject 8 Units into the skin at bedtime.  05/27/13   Renato Shin, MD  isometheptene-acetaminophen-dichloralphenazone (MIDRIN) [phone] MG capsule Take 1 capsule by mouth 4 (four) times daily as needed for migraine. Maximum 5 capsules in 12 hours for migraine headaches, 8 capsules in 24 hours for tension headaches. 12/03/13   Kathrynn Ducking, MD  loperamide (  IMODIUM) 2 MG capsule Take 2-4 mg by mouth as needed for diarrhea or loose stools.  10/07/13   Historical Provider, MD  LORazepam (ATIVAN) 1 MG tablet Take 1 tablet (1 mg total) by mouth 3 (three) times daily. 10/07/13   Thurnell Lose, MD  mometasone (NASONEX) 50 MCG/ACT nasal spray Place 2 sprays into both nostrils daily as needed (for congestion).     Historical Provider, MD  nicotine (NICODERM CQ - DOSED IN MG/24 HR) 7 mg/24hr patch Place 7 mg onto the skin daily.    Historical Provider, MD  omeprazole (PRILOSEC) 40 MG capsule Take 1 capsule (40 mg total) by mouth 2 (two) times daily. 07/17/13   Aleksei Plotnikov V, MD  ondansetron (ZOFRAN) 8 MG tablet Take 1 tablet (8 mg total) by mouth 2 (two) times daily. 02/10/14   Inda Castle, MD  Polyvinyl Alcohol (LIQUID TEARS OP) Place 1-2 drops into both eyes as needed (for dry eyes).    Historical Provider, MD  saccharomyces boulardii (FLORASTOR) 250 MG capsule Take 1 capsule (250 mg total) by mouth 2 (two) times daily. 01/14/14   Amy S Esterwood, PA-C  topiramate  (TOPAMAX) 100 MG tablet Take 100 mg by mouth 2 (two) times daily.    Historical Provider, MD  triamcinolone (KENALOG) 0.1 % ointment Apply 1 application topically 2 (two) times daily as needed (itching).     Historical Provider, MD  Vortioxetine HBr (BRINTELLIX) 20 MG TABS Take 20 mg by mouth daily.    Historical Provider, MD  zolmitriptan (ZOMIG) 5 MG tablet Take 1 tablet (5 mg total) by mouth 2 (two) times daily as needed for migraine. 11/10/13   Kathrynn Ducking, MD   BP 144/92 mmHg  Pulse 72  Temp(Src) 97.6 F (36.4 C)  Resp 18  SpO2 100% Physical Exam  Constitutional: She is oriented to person, place, and time. She appears well-developed. No distress.  Chronically ill appearing  HENT:  Head: Normocephalic and atraumatic.  Right Ear: External ear normal.  Left Ear: External ear normal.  Mouth/Throat: Oropharynx is clear and moist. No oropharyngeal exudate.  Eyes: Conjunctivae and EOM are normal. Pupils are equal, round, and reactive to light. No scleral icterus.  Neck: Normal range of motion. Neck supple. No JVD present. No tracheal deviation present. No thyromegaly present.  Cardiovascular: Normal rate, regular rhythm and intact distal pulses.  Exam reveals no gallop and no friction rub.   No murmur heard. Pulmonary/Chest: Effort normal and breath sounds normal. No respiratory distress. She has no wheezes. She has no rales.  Abdominal: Soft. Bowel sounds are normal. She exhibits no distension. There is no tenderness.  Musculoskeletal: Normal range of motion. She exhibits no edema.  Neurological: She is alert and oriented to person, place, and time. No cranial nerve deficit. She exhibits normal muscle tone. Coordination normal.  Skin: Skin is warm and dry. She is not diaphoretic. No pallor.  Psychiatric: She has a normal mood and affect. She expresses no homicidal and no suicidal ideation. She expresses no suicidal plans and no homicidal plans.  Nursing note and vitals  reviewed.   ED Course  Procedures (including critical care time) Labs Review Labs Reviewed  CBC WITH DIFFERENTIAL - Abnormal; Notable for the following:    Hemoglobin 11.9 (*)    HCT 35.1 (*)    All other components within normal limits  COMPREHENSIVE METABOLIC PANEL - Abnormal; Notable for the following:    Sodium 127 (*)    Chloride 90 (*)  Glucose, Bld 426 (*)    BUN 75 (*)    Creatinine, Ser 4.02 (*)    Calcium 14.5 (*)    Albumin 3.4 (*)    GFR calc non Af Amer 11 (*)    GFR calc Af Amer 12 (*)    All other components within normal limits  CBG MONITORING, ED - Abnormal; Notable for the following:    Glucose-Capillary 391 (*)    All other components within normal limits  URINE CULTURE  URINALYSIS, ROUTINE W REFLEX MICROSCOPIC  SODIUM, URINE, RANDOM  CREATININE, URINE, RANDOM  OSMOLALITY  OSMOLALITY, URINE    Imaging Review No results found.   EKG Interpretation None      MDM   Final diagnoses:  Acute renal failure, unspecified acute renal failure type  Frontal headache    The patient is a 68 y.o. F who is sent to the ED from her PCP for worsening renal function on outpatient lab draw. Patient also complains of a frontal headache for approximately the past day that was gradual on onset and "exactly the same" and her previous known migraines. Migraine cocktail given with improvement of headache. Labs tested showing creatinine of 4. Patient admitted for further management.  Patient seen with attending, Dr. Johnney Killian, who oversaw clinical decision making.     Margaretann Loveless, MD 03/11/14 1718  Charlesetta Shanks, MD 03/14/14 [phone]

## 2014-03-11 NOTE — ED Notes (Signed)
Pt sent here by her doctor with abnormal renal functions. sts she has been falling a lot lately and spiting up.

## 2014-03-11 NOTE — Progress Notes (Signed)
Admission note:  Arrival Method: Patient came in stretcher accompanied by staff. Mental Orientation:  Alert and oriented x 4. Telemetry: She is on Telemetry on 6E02.  NSR, CCMD notified. Assessment: See doc flow sheets. Skin: Warm, dry and intact.  A penny size scab noted on the back which is dry. IV: IV present and patent, infusing insulin and NS. Pain: Denies any pain currently. Tubes: Foley 14 Fr present, draining light yellow urine.  Fall Prevention Safety Plan: educated fall prevention safety plan, understood and acknowledged. Admission Screening: In progress. 6700 Orientation: Patient has been oriented to the unit, staff and to the room.

## 2014-03-11 NOTE — H&P (Signed)
Patient Demographics  Lindsey Gomez, is a 68 y.o. female  MRN: [id number]   DOB - [birth year]  Admit Date - 03/11/2014  Outpatient Primary MD for the patient is Mathews Argyle, MD   With History of -  Past Medical History  Diagnosis Date  . Depression   . Type II or unspecified type diabetes mellitus without mention of complication, not stated as uncontrolled   . HTN (hypertension)   . Chronic pain   . Fibromyalgia   . Migraines   . History of breast cancer     Right  . Chronic pancreatitis   . Vitamin D deficiency   . GERD (gastroesophageal reflux disease)   . Finger dislocation 2009    L 3rd  . COPD (chronic obstructive pulmonary disease)   . Anemia   . Fractured sternum 11/2008  . Osteoporosis     Reclast too expensive  . Barrett's esophagus   . Chronic fatigue   . Diverticulosis   . Opioid dependence   . 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 Abnormality of gait 10/12/2013  . Anxiety   . Depression   . Diabetes   . Convulsions/seizures 10/12/2013    Possible benzodiazepine withdrawal seizure, x1-no further seizure activity   . Cancer     HX OF BREAST CANCER -chemo, radiation      Past Surgical History  Procedure Laterality Date  . Nasal sinus surgery    . Pancreatectomy      40%  . Cholecystectomy    . Appendectomy    . Abdominal hysterectomy    . Lobectomy Left 2005    granulomatous lesion.   . Breast lumpectomy Left   . Ercp N/A 10/05/2013    Procedure: ENDOSCOPIC RETROGRADE CHOLANGIOPANCREATOGRAPHY (ERCP);  Surgeon: Ladene Artist, MD;  Location: [facility] ENDOSCOPY;  Service: Endoscopy;  Laterality: N/A;  . Cataract extraction Bilateral   . Botox injection      recent botox injection 12-23-13 injection for Migraines  . Cataract extraction, bilateral Bilateral     bilateral  . Eye surgery     1 eye "membrane surgery"  . Ercp N/A 01/07/2014    Procedure: ENDOSCOPIC RETROGRADE CHOLANGIOPANCREATOGRAPHY (ERCP);  Surgeon: Inda Castle, MD;  Location: [facility] ENDOSCOPY;  Service: Endoscopy;  Laterality: N/A;  . Spyglass cholangioscopy N/A 01/07/2014    Procedure: JOACZYSA CHOLANGIOSCOPY;  Surgeon: Inda Castle, MD;  Location: [facility] ENDOSCOPY;  Service: Endoscopy;  Laterality: N/A;    in for   Chief Complaint  Patient presents with  . Abnormal Lab     HPI  Lindsey Gomez  is a 68 y.o. female,  diabetes mellitus type 2, chronic pancreatitis it is post partial pancreatectomy, fibromyalgia, migraine headaches, GERD, history of right-sided breast cancer, essential hypertension, GERD who was noticed that her sugars have been running around 300 for the last 2-3 weeks, she has been getting gradually weak with poor balance, went to see her PCP where her blood work indicated  acute renal failure and she was sent to the ER.   In the ER acute renal failure was confirmed, sugars were 426 she had evidence of nonketotic hyperosmolar state and I was called to admit the patient. Patient currently denies any headache fever chills, no chest pain palpitations cough phlegm, no diarrhea, no dysuria, no focal weakness. In the ER bladder scan postvoid showed 300 mL of urine and she had no R to P. Foley catheter was placed.    Review of Systems    In addition to the HPI above,  No Fever-chills, No Headache, No changes with Vision or hearing, No problems swallowing food or Liquids, No Chest pain, Cough or Shortness of Breath, No Abdominal pain, No Nausea or Vommitting, Bowel movements are regular, No Blood in stool or Urine, No dysuria, No new skin rashes or bruises, No new joints pains-aches,  No new weakness, tingling, numbness in any extremity, positive generalized weakness, No recent weight gain or loss, No polyuria, polydypsia or polyphagia, No significant Mental Stressors.  A full 10 point Review  of Systems was done, except as stated above, all other Review of Systems were negative.   Social History History  Substance Use Topics  . Smoking status: Former Smoker -- 1.00 packs/day for 40 years    Types: Cigarettes    Quit date: 01/25/2014  . Smokeless tobacco: Never Used     Comment: started patch 01/25/14  . Alcohol Use: No      Family History Family History  Problem Relation Age of Onset  . Heart attack Brother   . COPD Father   . Heart disease Father   . Leukemia Mother   . Diabetes Maternal Uncle   . Dementia Sister   . Alzheimer's disease Sister   . Heart attack Brother   . Colon cancer Neg Hx   . Colon polyps Neg Hx       Prior to Admission medications   Medication Sig Start Date End Date Taking? Authorizing Provider  ACCU-CHEK AVIVA PLUS test strip 1 each by Other route See admin instructions. Check blood sugar 4 times daily. 10/08/13   Historical Provider, MD  atenolol (TENORMIN) 100 MG tablet Take 1 tablet (100 mg total) by mouth every morning. 05/15/13   Aleksei Plotnikov V, MD  atorvastatin (LIPITOR) 10 MG tablet Take 1 tablet (10 mg total) by mouth daily. 05/15/13 05/18/14  Aleksei Plotnikov V, MD  calcitRIOL (ROCALTROL) 0.5 MCG capsule Take 2 capsules (1 mcg total) by mouth daily. 05/15/13   Aleksei Plotnikov V, MD  dicyclomine (BENTYL) 10 MG capsule Take 1 capsule (10 mg total) by mouth 3 (three) times daily before meals. 01/14/14   Amy S Esterwood, PA-C  docusate sodium (COLACE) 100 MG capsule Take 100 mg by mouth daily as needed for mild constipation.    Historical Provider, MD  ferrous sulfate 325 (65 FE) MG tablet Take 325 mg by mouth daily with breakfast.    Historical Provider, MD  glucagon (GLUCAGON EMERGENCY) 1 MG injection Inject 1 mg into the vein once as needed (for glucose).    Historical Provider, MD  hydrocortisone (CORTEF) 10 MG tablet Take 1 tablet (10 mg total) by mouth 2 (two) times daily. 05/15/13   Aleksei Plotnikov V, MD  insulin aspart (NOVOLOG)  100 UNIT/ML FlexPen Inject 0-7 Units into the skin 3 (three) times daily with meals. 70-120 = 0 units, 121-150 = 1 unit, 151-200 = 2 units, 201-250 = 3 units, 251-300 = 5 units, 301-350 =  7 units. 01/13/13   Renato Shin, MD  insulin detemir (LEVEMIR) 100 UNIT/ML injection Inject 8 Units into the skin at bedtime.  05/27/13   Renato Shin, MD  isometheptene-acetaminophen-dichloralphenazone (MIDRIN) [id number] MG capsule Take 1 capsule by mouth 4 (four) times daily as needed for migraine. Maximum 5 capsules in 12 hours for migraine headaches, 8 capsules in 24 hours for tension headaches. 12/03/13   Kathrynn Ducking, MD  loperamide (IMODIUM) 2 MG capsule Take 2-4 mg by mouth as needed for diarrhea or loose stools.  10/07/13   Historical Provider, MD  LORazepam (ATIVAN) 1 MG tablet Take 1 tablet (1 mg total) by mouth 3 (three) times daily. 10/07/13   Thurnell Lose, MD  mometasone (NASONEX) 50 MCG/ACT nasal spray Place 2 sprays into both nostrils daily as needed (for congestion).     Historical Provider, MD  nicotine (NICODERM CQ - DOSED IN MG/24 HR) 7 mg/24hr patch Place 7 mg onto the skin daily.    Historical Provider, MD  omeprazole (PRILOSEC) 40 MG capsule Take 1 capsule (40 mg total) by mouth 2 (two) times daily. 07/17/13   Aleksei Plotnikov V, MD  ondansetron (ZOFRAN) 8 MG tablet Take 1 tablet (8 mg total) by mouth 2 (two) times daily. 02/10/14   Inda Castle, MD  Polyvinyl Alcohol (LIQUID TEARS OP) Place 1-2 drops into both eyes as needed (for dry eyes).    Historical Provider, MD  saccharomyces boulardii (FLORASTOR) 250 MG capsule Take 1 capsule (250 mg total) by mouth 2 (two) times daily. 01/14/14   Amy S Esterwood, PA-C  topiramate (TOPAMAX) 100 MG tablet Take 100 mg by mouth 2 (two) times daily.    Historical Provider, MD  triamcinolone (KENALOG) 0.1 % ointment Apply 1 application topically 2 (two) times daily as needed (itching).     Historical Provider, MD  Vortioxetine HBr (BRINTELLIX) 20 MG TABS  Take 20 mg by mouth daily.    Historical Provider, MD  zolmitriptan (ZOMIG) 5 MG tablet Take 1 tablet (5 mg total) by mouth 2 (two) times daily as needed for migraine. 11/10/13   Kathrynn Ducking, MD    Allergies  Allergen Reactions  . Milnacipran Swelling and Other (See Comments)    Headache and hand swelling   . Rizatriptan Benzoate Nausea And Vomiting  . Gabapentin Other (See Comments)    Weak muscles  . Moxifloxacin Other (See Comments)    Unknown  . Sulfonamide Derivatives Rash  . Venlafaxine Other (See Comments)    Unknown    Physical Exam  Vitals  Blood pressure 159/91, pulse 73, temperature 97.6 F (36.4 C), resp. rate 18, SpO2 99 %.   1. General frail middle-aged Hass female lying in bed in NAD,     2. Normal affect and insight, Not Suicidal or Homicidal, Awake Alert, Oriented X 3.  3. No F.N deficits, ALL C.Nerves Intact, Strength 5/5 all 4 extremities, Sensation intact all 4 extremities, Plantars down going.  4. Ears and Eyes appear Normal, Conjunctivae clear, PERRLA. Dry Oral Mucosa.  5. Supple Neck, No JVD, No cervical lymphadenopathy appriciated, No Carotid Bruits.  6. Symmetrical Chest wall movement, Good air movement bilaterally, CTAB.  7. RRR, No Gallops, Rubs or Murmurs, No Parasternal Heave.  8. Positive Bowel Sounds, Abdomen Soft, No tenderness, No organomegaly appriciated,No rebound -guarding or rigidity.  9.  No Cyanosis, reduced Skin Turgor, No Skin Rash or Bruise.  10. Good muscle tone,  joints appear normal , no effusions, Normal ROM.  11. No Palpable Lymph Nodes in Neck or Axillae     Data Review  CBC  Recent Labs Lab 03/11/14 1305  WBC 9.5  HGB 11.9*  HCT 35.1*  PLT 153  MCV 89.5  MCH 30.4  MCHC 33.9  RDW 15.5  LYMPHSABS 1.9  MONOABS 0.6  EOSABS 0.2  BASOSABS 0.0   ------------------------------------------------------------------------------------------------------------------  Chemistries   Recent Labs Lab  03/11/14 1305  NA 127*  K 5.0  CL 90*  CO2 23  GLUCOSE 426*  BUN 75*  CREATININE 4.02*  CALCIUM 14.5*  AST 23  ALT 31  ALKPHOS 94  BILITOT 0.6   ------------------------------------------------------------------------------------------------------------------ CrCl cannot be calculated (Unknown ideal weight.). ------------------------------------------------------------------------------------------------------------------ No results for input(s): TSH, T4TOTAL, T3FREE, THYROIDAB in the last 72 hours.  Invalid input(s): FREET3   Coagulation profile No results for input(s): INR, PROTIME in the last 168 hours. ------------------------------------------------------------------------------------------------------------------- No results for input(s): DDIMER in the last 72 hours. -------------------------------------------------------------------------------------------------------------------  Cardiac Enzymes No results for input(s): CKMB, TROPONINI, MYOGLOBIN in the last 168 hours.  Invalid input(s): CK ------------------------------------------------------------------------------------------------------------------ Invalid input(s): POCBNP   ---------------------------------------------------------------------------------------------------------------  Urinalysis    Component Value Date/Time   COLORURINE YELLOW 03/11/2014 1623   APPEARANCEUR CLEAR 03/11/2014 1623   LABSPEC 1.011 03/11/2014 1623   PHURINE 7.5 03/11/2014 1623   GLUCOSEU 100* 03/11/2014 1623   GLUCOSEU NEGATIVE 09/10/2013 1010   HGBUR NEGATIVE 03/11/2014 1623   BILIRUBINUR NEGATIVE 03/11/2014 1623   KETONESUR NEGATIVE 03/11/2014 1623   PROTEINUR NEGATIVE 03/11/2014 1623   UROBILINOGEN 0.2 03/11/2014 1623   NITRITE NEGATIVE 03/11/2014 1623   LEUKOCYTESUR SMALL* 03/11/2014 1623    ----------------------------------------------------------------------------------------------------------------  Imaging  results:   No results found.       Assessment & Plan   1. Nonketotic hyperosmolar state causing dehydration and acute renal failure and hyponatremia. Will be admitted to a telemetry bed, IV fluid bolus followed by maintenance, urine electrolytes, glucose stabilized drip, low-dose Lantus. Monitor BMP serially. Bladder scan showed 400 mL of urine without the urge to urinate, Foley placed.    2. 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 As above.    3. Essential hypertension. Continue home medications.    4. Chronic pancreatitis. No abdominal pain, stable. Supportive care.    5. Fibromyalgia. Continue lyrica.    6. History of smoking. Continue nicotine patch, quit 6 weeks ago, counseled.       DVT Prophylaxis Heparin   AM Labs Ordered, also please review Full Orders  Family Communication: Admission, patients condition and plan of care including tests being ordered have been discussed with the patient and freind who indicate understanding and agree with the plan and Code Status.  Code Status DNR  Likely DC to  Home  Condition GUARDED     Time spent in minutes : 35    SINGH,PRASHANT K M.D on 03/11/2014 at 5:26 PM  Between 7am to 7pm - Pager - [phone]  After 7pm go to [email or web address] - [facility] Office  [phone]

## 2014-03-12 ENCOUNTER — Inpatient Hospital Stay (HOSPITAL_COMMUNITY): Payer: Medicare Other

## 2014-03-12 LAB — GLUCOSE, CAPILLARY
GLUCOSE-CAPILLARY: 159 mg/dL — AB (ref 70–99)
GLUCOSE-CAPILLARY: 180 mg/dL — AB (ref 70–99)
Glucose-Capillary: 106 mg/dL — ABNORMAL HIGH (ref 70–99)
Glucose-Capillary: 74 mg/dL (ref 70–99)
Glucose-Capillary: 69 mg/dL — ABNORMAL LOW (ref 70–99)
Glucose-Capillary: 145 mg/dL — ABNORMAL HIGH (ref 70–99)
Glucose-Capillary: 157 mg/dL — ABNORMAL HIGH (ref 70–99)
Glucose-Capillary: 217 mg/dL — ABNORMAL HIGH (ref 70–99)
Glucose-Capillary: 203 mg/dL — ABNORMAL HIGH (ref 70–99)

## 2014-03-12 LAB — BASIC METABOLIC PANEL
Anion gap: 12 (ref 5–15)
BUN: 72 mg/dL — ABNORMAL HIGH (ref 6–23)
CO2: 22 mmol/L (ref 19–32)
Calcium: 12.8 mg/dL — ABNORMAL HIGH (ref 8.4–10.5)
Chloride: 102 mEq/L (ref 96–112)
Creatinine, Ser: 3.82 mg/dL — ABNORMAL HIGH (ref 0.50–1.10)
GFR calc Af Amer: 13 mL/min — ABNORMAL LOW (ref >90)
GFR calc non Af Amer: 11 mL/min — ABNORMAL LOW (ref >90)
Glucose, Bld: 160 mg/dL — ABNORMAL HIGH (ref 70–99)
Potassium: 5.1 mmol/L (ref 3.5–5.1)
Sodium: 136 mmol/L (ref 135–145)

## 2014-03-12 LAB — CBC
HCT: 29.7 % — ABNORMAL LOW (ref 36.0–46.0)
Hemoglobin: 9.9 g/dL — ABNORMAL LOW (ref 12.0–15.0)
MCH: 30 pg (ref 26.0–34.0)
MCHC: 33.3 g/dL (ref 30.0–36.0)
MCV: 90 fL (ref 78.0–100.0)
PLATELETS: 91 10*3/uL — AB (ref 150–400)
RBC: 3.3 MIL/uL — AB (ref 3.87–5.11)
RDW: 15.5 % (ref 11.5–15.5)
WBC: 4.5 10*3/uL (ref 4.0–10.5)

## 2014-03-12 MED ORDER — AMLODIPINE BESYLATE 10 MG PO TABS
10.0000 mg | ORAL_TABLET | Freq: Every day | ORAL | Status: DC
  Administered 2014-03-12 – 2014-03-15 (×4): 10 mg via ORAL
  Filled 2014-03-12 (×4): qty 1

## 2014-03-12 MED ORDER — HYDROCODONE-ACETAMINOPHEN 5-325 MG PO TABS
1.0000 | ORAL_TABLET | ORAL | Status: DC | PRN
  Administered 2014-03-12 – 2014-03-15 (×6): 1 via ORAL
  Filled 2014-03-12 (×6): qty 1

## 2014-03-12 MED ORDER — CEFTRIAXONE SODIUM IN DEXTROSE 20 MG/ML IV SOLN
1.0000 g | INTRAVENOUS | Status: AC
  Administered 2014-03-12 – 2014-03-14 (×3): 1 g via INTRAVENOUS
  Filled 2014-03-12 (×3): qty 50

## 2014-03-12 MED ORDER — SODIUM CHLORIDE 0.9 % IV SOLN
INTRAVENOUS | Status: DC
  Administered 2014-03-13 – 2014-03-14 (×3): via INTRAVENOUS

## 2014-03-12 MED ORDER — INSULIN ASPART 100 UNIT/ML ~~LOC~~ SOLN
0.0000 [IU] | Freq: Three times a day (TID) | SUBCUTANEOUS | Status: DC
  Administered 2014-03-12: 3 [IU] via SUBCUTANEOUS
  Administered 2014-03-12 (×2): 2 [IU] via SUBCUTANEOUS
  Administered 2014-03-13: 3 [IU] via SUBCUTANEOUS
  Administered 2014-03-14: 2 [IU] via SUBCUTANEOUS
  Administered 2014-03-15: 5 [IU] via SUBCUTANEOUS
  Administered 2014-03-15: 1 [IU] via SUBCUTANEOUS

## 2014-03-12 NOTE — Progress Notes (Signed)
Patient up in chair

## 2014-03-12 NOTE — Progress Notes (Signed)
Chaplain responded to spiritual care consult for pt requesting prayer. Chaplain offered prayer and emotional support. Chaplain informed pt about chaplain presence should she need it. Page chaplain as needed.    03/12/14 1400  Clinical Encounter Type  Visited With Patient and family together  Visit Type Initial;Spiritual support  Referral From Nurse  Spiritual Encounters  Spiritual Needs Prayer;Emotional  Stress Factors  Patient Stress Factors Health changes;Family relationships  Tyleek Smick, Epifanio Lesches 03/12/2014 2:47 PM

## 2014-03-12 NOTE — Evaluation (Signed)
Physical Therapy Evaluation Patient Details Name: Lindsey Gomez MRN: [id number] DOB: Feb 21, 1946 Today's Date: 03/12/2014   History of Present Illness  Pt is a 69 y.o. female, diabetes mellitus type 2, chronic pancreatitis s/p partial pancreatectomy, fibromyalgia, migraine headaches, GERD, history of right-sided breast cancer, and essential hypertension, who was noticed that her sugars have been running around 300 for the last 2-3 weeks PTA, she has been getting gradually weak with poor balance, went to see her PCP where her blood work indicated acute renal failure and she was sent to the ER.  Clinical Impression  Pt admitted with above diagnosis. Pt currently with functional limitations due to the deficits listed below (see PT Problem List). At the time of PT eval pt does not appear very motivated to participate with therapy. Pt declines use of AD and is unsteady on her feet. Feel that pt is at a very high risk of falls if returns home.   Pt will benefit from skilled PT to increase their independence and safety with mobility to allow discharge to the venue listed below. Pt wishes to return home at d/c, however PT recommending continued skilled PT services at the SNF level. Do not feel that pt will be able to tolerate 3 hours of therapy per day at [facility].      Follow Up Recommendations SNF;Supervision/Assistance - 24 hour    Equipment Recommendations  Rolling walker with 5" wheels (Current walker belongs to husband)    Recommendations for Other Services       Precautions / Restrictions Precautions Precautions: Fall Restrictions Weight Bearing Restrictions: No      Mobility  Bed Mobility Overal bed mobility: Needs Assistance Bed Mobility: Supine to Sit     Supine to sit: Min assist     General bed mobility comments: Pt was able to transition to EOB with assist for initiation of LE movement and HHA to pull to sit. Pt able to independently scoot out to EOB so feet were resting on floor.    Transfers Overall transfer level: Needs assistance Equipment used: Rolling walker (2 wheeled);2 person hand held assist Transfers: Sit to/from Stand Sit to Stand: Min guard         General transfer comment: Steadying assist as pt powered-up to full standing position.   Ambulation/Gait Ambulation/Gait assistance: Min assist;+2 safety/equipment Ambulation Distance (Feet): 125 Feet Assistive device: 2 person hand held assist Gait Pattern/deviations: Step-through pattern;Decreased stride length;Shuffle;Trunk flexed;Narrow base of support Gait velocity: Decreased Gait velocity interpretation: Below normal speed for age/gender General Gait Details: Pt ambulating very slowly and taking very small shuffling steps. Pt was cued for increased step/stride length however pt did not make corrective changes. Very unsteady at times. Adamantly refused use of RW.   Stairs            Wheelchair Mobility    Modified Rankin (Stroke Patients Only)       Balance Overall balance assessment: Needs assistance Sitting-balance support: Feet supported;No upper extremity supported Sitting balance-Leahy Scale: Fair     Standing balance support: No upper extremity supported;During functional activity Standing balance-Leahy Scale: Poor Standing balance comment: Requires assist to maintain standing balance.                              Pertinent Vitals/Pain Pain Assessment: No/denies pain    Home Living Family/patient expects to be discharged to:: Private residence Living Arrangements: Spouse/significant other Available Help at Discharge: Family;Personal care attendant;Available  PRN/intermittently Type of Home: House Home Access: Level entry     Home Layout: One level Home Equipment: Divide - 2 wheels;Bedside commode;Shower seat;Grab bars - tub/shower;Hand held shower head;Cane - single point Additional Comments: RW belongs to husband.    Prior Function Level of  Independence: Needs assistance   Gait / Transfers Assistance Needed: Pt ambulating (I) PTA however history of falls.   ADL's / Homemaking Assistance Needed: Pt states that aide was assisting with washing her back and supervision for bathing, and that she occasionally needed assist to dress.        Hand Dominance   Dominant Hand: Right    Extremity/Trunk Assessment   Upper Extremity Assessment: Defer to OT evaluation           Lower Extremity Assessment: Generalized weakness      Cervical / Trunk Assessment: Normal  Communication   Communication: No difficulties  Cognition Arousal/Alertness: Lethargic Behavior During Therapy: WFL for tasks assessed/performed Overall Cognitive Status: No family/caregiver present to determine baseline cognitive functioning                      General Comments      Exercises        Assessment/Plan    PT Assessment Patient needs continued PT services  PT Diagnosis Difficulty walking;Abnormality of gait;Generalized weakness   PT Problem List Decreased strength;Decreased range of motion;Decreased activity tolerance;Decreased balance;Decreased mobility;Decreased knowledge of use of DME;Decreased safety awareness;Decreased knowledge of precautions  PT Treatment Interventions DME instruction;Stair training;Gait training;Functional mobility training;Therapeutic activities;Therapeutic exercise;Neuromuscular re-education;Patient/family education   PT Goals (Current goals can be found in the Care Plan section) Acute Rehab PT Goals Patient Stated Goal: Return home PT Goal Formulation: With patient Time For Goal Achievement: 03/19/14 Potential to Achieve Goals: Good    Frequency Min 3X/week   Barriers to discharge Decreased caregiver support Unclear if pt's husband is available for 24 hour assist.     Co-evaluation               End of Session Equipment Utilized During Treatment: Gait belt Activity Tolerance: Patient  limited by lethargy Patient left: in chair;with chair alarm set;with call bell/phone within reach Nurse Communication: Mobility status         Time: 7867-6720 PT Time Calculation (min) (ACUTE ONLY): 38 min   Charges:   PT Evaluation $Initial PT Evaluation Tier I: 1 Procedure PT Treatments $Gait Training: 8-22 mins $Therapeutic Activity: 8-22 mins   PT G Codes:        Rolinda Roan 03-24-2014, 1:38 PM   Rolinda Roan, PT, DPT Acute Rehabilitation Services Pager: [phone]

## 2014-03-12 NOTE — Progress Notes (Signed)
Patient Demographics  Lindsey Gomez, is a 69 y.o. female, DOB - 1945-11-26, GYF:[id number]  Admit date - 03/11/2014   Admitting Physician Thurnell Lose, MD  Outpatient Primary MD for the patient is Mathews Argyle, MD  LOS - 1   Chief Complaint  Patient presents with  . Abnormal Lab        Subjective:   Lindsey Gomez today has, No headache, No chest pain, No abdominal pain - No Nausea, No new weakness tingling or numbness, No Cough - SOB. Feels better.  Assessment & Plan     1. Nonketotic hyperosmolar state causing dehydration and acute renal failure and hyponatremia. Improved after IV glucose stabilizer, IV fluids, avoiding nephrotoxins. Bladder scan in the ER showed 400 mL of urine without the urge to urinate, Foley placed. BMP improving continue to monitor. Will check baseline renal ultrasound.  Lab Results  Component Value Date   HGBA1C 7.0* 09/30/2013      2. 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 Resolved after glucose stabilizer, placed on Lantus and sliding scale.  CBG (last 3)   Recent Labs  03/12/14 0327 03/12/14 0349 03/12/14 0723  GLUCAP 69* 145* 157*      3. Essential hypertension. Continue home dose beta blocker will add Norvasc for better control.    4. Chronic pancreatitis. No abdominal pain, stable. Supportive care.    5. Fibromyalgia. Continue Lyrica stable.    6. History of smoking. Continue nicotine patch, quit 6 weeks ago, counseled.    7. UTI. Empiric Rocephin, monitor cultures.    8. Chronic hydrocortisone use. Discussed with patient and husband, they do not know why she uses this medication. We will continue for now.     Code Status:  DNR  Family Communication: Husband   Disposition Plan: TBD   Procedures     Renal US   Consults      Medications  Scheduled Meds: . atenolol  100 mg Oral Daily  . atorvastatin  10 mg Oral Daily  . calcitRIOL  1 mcg Oral Daily  . cefTRIAXone (ROCEPHIN)  IV  1 g Intravenous Q24H  . dicyclomine  10 mg Oral TID AC  . ferrous sulfate  325 mg Oral Q breakfast  . heparin  5,000 Units Subcutaneous 3 times per day  . hydrocortisone  10 mg Oral BID  . insulin aspart  0-9 Units Subcutaneous TID WC  . insulin detemir  10 Units Subcutaneous QHS  . insulin regular  0-10 Units Intravenous TID WC  . LORazepam  1 mg Oral TID  . nicotine  7 mg Transdermal Q24H  . pantoprazole  40 mg Oral Daily  . saccharomyces boulardii  250 mg Oral BID  . sodium chloride  3 mL Intravenous Q12H  . topiramate  100 mg Oral BID  . Vortioxetine HBr  20 mg Oral Daily   Continuous Infusions: . sodium chloride 125 mL/hr (03/12/14 0849)  . insulin (NOVOLIN-R) infusion 1.7 Units/hr (03/12/14 0357)   PRN Meds:.alum & mag hydroxide-simeth, dextrose, docusate sodium, guaiFENesin-dextromethorphan, HYDROcodone-acetaminophen, loperamide, ondansetron **OR** ondansetron (ZOFRAN) IV, polyethylene glycol, prochlorperazine, triamcinolone ointment  DVT Prophylaxis    Heparin    Lab Results  Component Value Date   PLT 91* 03/12/2014  Antibiotics     Anti-infectives    Start     Dose/Rate Route Frequency Ordered Stop   03/12/14 1000  cefTRIAXone (ROCEPHIN) 1 g in dextrose 5 % 50 mL IVPB - Premix     1 g100 mL/hr over 30 Minutes Intravenous Every 24 hours 03/12/14 0948 03/15/14 0959          Objective:   Filed Vitals:   03/11/14 1745 03/11/14 1944 03/12/14 0345 03/12/14 0859  BP: 146/92 136/88 157/80 152/91  Pulse: 72 76 72 78  Temp:  98.5 F (36.9 C) 98.3 F (36.8 C) 98.4 F (36.9 C)  TempSrc:  Oral Oral Oral  Resp:  18 18 16   Weight:  39.066 kg (86 lb 2 oz)    SpO2: 98% 99% 99% 92%    Wt Readings from Last 3 Encounters:  03/11/14 39.066 kg (86 lb 2 oz)  01/28/14 39.066  kg (86 lb 2 oz)  01/14/14 41.334 kg (91 lb 2 oz)     Intake/Output Summary (Last 24 hours) at 03/12/14 0949 Last data filed at 03/12/14 0600  Gross per 24 hour  Intake 1404.91 ml  Output   1900 ml  Net -495.09 ml     Physical Exam  Awake Alert, Oriented X 3, No new F.N deficits, Normal affect [place].AT,PERRAL Supple Neck,No JVD, No cervical lymphadenopathy appriciated.  Symmetrical Chest wall movement, Good air movement bilaterally, CTAB RRR,No Gallops,Rubs or new Murmurs, No Parasternal Heave +ve B.Sounds, Abd Soft, No tenderness, No organomegaly appriciated, No rebound - guarding or rigidity. No Cyanosis, Clubbing or edema, No new Rash or bruise      Data Review   Micro Results No results found for this or any previous visit (from the past 240 hour(s)).  Radiology Reports No results found.   CBC  Recent Labs Lab 03/11/14 1305 03/12/14 0620  WBC 9.5 4.5  HGB 11.9* 9.9*  HCT 35.1* 29.7*  PLT 153 91*  MCV 89.5 90.0  MCH 30.4 30.0  MCHC 33.9 33.3  RDW 15.5 15.5  LYMPHSABS 1.9  --   MONOABS 0.6  --   EOSABS 0.2  --   BASOSABS 0.0  --     Chemistries   Recent Labs Lab 03/11/14 1305 03/12/14 0630  NA 127* 136  K 5.0 5.1  CL 90* 102  CO2 23 22  GLUCOSE 426* 160*  BUN 75* 72*  CREATININE 4.02* 3.82*  CALCIUM 14.5* 12.8*  AST 23  --   ALT 31  --   ALKPHOS 94  --   BILITOT 0.6  --    ------------------------------------------------------------------------------------------------------------------ estimated creatinine clearance is 8.9 mL/min (by C-G formula based on Cr of 3.82). ------------------------------------------------------------------------------------------------------------------ No results for input(s): HGBA1C in the last 72 hours. ------------------------------------------------------------------------------------------------------------------ No results for input(s): CHOL, HDL, LDLCALC, TRIG, CHOLHDL, LDLDIRECT in the last 72  hours. ------------------------------------------------------------------------------------------------------------------ No results for input(s): TSH, T4TOTAL, T3FREE, THYROIDAB in the last 72 hours.  Invalid input(s): FREET3 ------------------------------------------------------------------------------------------------------------------ No results for input(s): VITAMINB12, FOLATE, FERRITIN, TIBC, IRON, RETICCTPCT in the last 72 hours.  Coagulation profile No results for input(s): INR, PROTIME in the last 168 hours.  No results for input(s): DDIMER in the last 72 hours.  Cardiac Enzymes No results for input(s): CKMB, TROPONINI, MYOGLOBIN in the last 168 hours.  Invalid input(s): CK ------------------------------------------------------------------------------------------------------------------ Invalid input(s): POCBNP     Time Spent in minutes   35   Lala Lund K M.D on 03/12/2014 at 9:49 AM  Between 7am to 7pm - Pager - [phone]  After 7pm go to [email or web address] - [facility] Office  [phone]

## 2014-03-12 NOTE — Clinical Social Work Note (Signed)
Per chart review, PT recommending SNF placement at time of discharge.  CSW spoke with pt regarding discharge disposition.   Pt currently refusing SNF placement.  Pt informed CSW pt from home with pt's husband and 24/7 caregivers provided by "[employer]."  [place] signing off. No other needs addressed.  Lubertha Sayres, [place] [phone]) Licensed Clinical Social Worker Orthopedics [phone]) and Surgical [phone])

## 2014-03-13 ENCOUNTER — Inpatient Hospital Stay (HOSPITAL_COMMUNITY): Payer: Medicare Other

## 2014-03-13 DIAGNOSIS — K219 Gastro-esophageal reflux disease without esophagitis: Secondary | ICD-10-CM

## 2014-03-13 LAB — BASIC METABOLIC PANEL
ANION GAP: 11 (ref 5–15)
BUN: 62 mg/dL — ABNORMAL HIGH (ref 6–23)
CO2: 22 mmol/L (ref 19–32)
Calcium: 11.8 mg/dL — ABNORMAL HIGH (ref 8.4–10.5)
Chloride: 106 mEq/L (ref 96–112)
Creatinine, Ser: 3.31 mg/dL — ABNORMAL HIGH (ref 0.50–1.10)
GFR calc Af Amer: 16 mL/min — ABNORMAL LOW (ref >90)
GFR, EST NON AFRICAN AMERICAN: 14 mL/min — AB (ref >90)
Glucose, Bld: 76 mg/dL (ref 70–99)
POTASSIUM: 3.8 mmol/L (ref 3.5–5.1)
Sodium: 139 mmol/L (ref 135–145)

## 2014-03-13 LAB — GLUCOSE, CAPILLARY
Glucose-Capillary: 63 mg/dL — ABNORMAL LOW (ref 70–99)
Glucose-Capillary: 91 mg/dL (ref 70–99)
Glucose-Capillary: 231 mg/dL — ABNORMAL HIGH (ref 70–99)
Glucose-Capillary: 153 mg/dL — ABNORMAL HIGH (ref 70–99)

## 2014-03-13 MED ORDER — INSULIN DETEMIR 100 UNIT/ML ~~LOC~~ SOLN
7.0000 [IU] | Freq: Every day | SUBCUTANEOUS | Status: DC
  Administered 2014-03-13: 7 [IU] via SUBCUTANEOUS
  Filled 2014-03-13 (×3): qty 0.07

## 2014-03-13 MED ORDER — BISACODYL 10 MG RE SUPP
10.0000 mg | Freq: Every day | RECTAL | Status: DC
  Administered 2014-03-13: 10 mg via RECTAL
  Filled 2014-03-13 (×2): qty 1

## 2014-03-13 MED ORDER — MORPHINE SULFATE 2 MG/ML IJ SOLN
1.0000 mg | Freq: Once | INTRAMUSCULAR | Status: AC
Start: 2014-03-13 — End: 2014-03-13
  Administered 2014-03-13: 1 mg via INTRAVENOUS
  Filled 2014-03-13: qty 1

## 2014-03-13 MED ORDER — DOCUSATE SODIUM 100 MG PO CAPS
200.0000 mg | ORAL_CAPSULE | Freq: Two times a day (BID) | ORAL | Status: DC
  Administered 2014-03-13 – 2014-03-15 (×5): 200 mg via ORAL
  Filled 2014-03-13 (×7): qty 2

## 2014-03-13 NOTE — Progress Notes (Signed)
Patient Demographics  Lindsey Gomez, is a 69 y.o. female, DOB - May 16, 1945, BCW:[id number]  Admit date - 03/11/2014   Admitting Physician Thurnell Lose, MD  Outpatient Primary MD for the patient is Mathews Argyle, MD  LOS - 2   Chief Complaint  Patient presents with  . Abnormal Lab        Subjective:   Lindsey Gomez today has, No headache, No chest pain, No abdominal pain - No Nausea, No new weakness tingling or numbness, No Cough - SOB. Feels better.  Assessment & Plan     1. Nonketotic hyperosmolar state causing dehydration and acute renal failure and hyponatremia. Improved after IV glucose stabilizer, IV fluids, avoiding nephrotoxins. Bladder scan in the ER showed 400 mL of urine without the urge to urinate, Foley placed in the ER, will give her removal trial and monitor postvoid residuals. BMP improving continue to monitor. Stable renal ultrasound.  Lab Results  Component Value Date   HGBA1C 7.0* 09/30/2013      2. 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 Resolved after glucose stabilizer, placed on Lantus and sliding scale. We'll reduce Lantus dose as oral intake is borderline.  CBG (last 3)   Recent Labs  03/12/14 1635 03/12/14 2111 03/13/14 0722  GLUCAP 217* 203* 63*      3. Essential hypertension. Continue home dose beta blocker will add Norvasc for better control.    4. Chronic pancreatitis. No abdominal pain, stable. Supportive care.    5. Fibromyalgia. Continue Lyrica stable.    6. History of smoking. Continue nicotine patch, quit 6 weeks ago, counseled.    7. UTI. Empiric Rocephin, monitor cultures.    8. Chronic hydrocortisone use. Discussed with patient and husband, they do not know why she uses this medication. We will continue for  now.   9. Mild nausea. Likely due to obstipation, bowel regimen, Zofran when necessary, check abdominal x-ray.     Code Status:  DNR  Family Communication: Husband , daughter  Disposition Plan: TBD   Procedures    Renal US    Consults      Medications  Scheduled Meds: . amLODipine  10 mg Oral Daily  . atenolol  100 mg Oral Daily  . atorvastatin  10 mg Oral Daily  . calcitRIOL  1 mcg Oral Daily  . cefTRIAXone (ROCEPHIN)  IV  1 g Intravenous Q24H  . dicyclomine  10 mg Oral TID AC  . ferrous sulfate  325 mg Oral Q breakfast  . heparin  5,000 Units Subcutaneous 3 times per day  . hydrocortisone  10 mg Oral BID  . insulin aspart  0-9 Units Subcutaneous TID WC  . insulin detemir  7 Units Subcutaneous QHS  . LORazepam  1 mg Oral TID  . nicotine  7 mg Transdermal Q24H  . pantoprazole  40 mg Oral Daily  . saccharomyces boulardii  250 mg Oral BID  . sodium chloride  3 mL Intravenous Q12H  . topiramate  100 mg Oral BID  . Vortioxetine HBr  20 mg Oral Daily   Continuous Infusions: . sodium chloride 125 mL/hr (03/12/14 0849)   PRN Meds:.alum & mag hydroxide-simeth, dextrose, docusate sodium, guaiFENesin-dextromethorphan, HYDROcodone-acetaminophen, loperamide, ondansetron **OR** ondansetron (ZOFRAN) IV, polyethylene glycol,  prochlorperazine, triamcinolone ointment  DVT Prophylaxis    Heparin    Lab Results  Component Value Date   PLT 91* 03/12/2014    Antibiotics     Anti-infectives    Start     Dose/Rate Route Frequency Ordered Stop   03/12/14 1000  cefTRIAXone (ROCEPHIN) 1 g in dextrose 5 % 50 mL IVPB - Premix     1 g100 mL/hr over 30 Minutes Intravenous Every 24 hours 03/12/14 0948 03/15/14 0959          Objective:   Filed Vitals:   03/12/14 1500 03/12/14 1749 03/12/14 2118 03/13/14 0416  BP:  153/91 165/92 129/72  Pulse:  98 78 67  Temp:  98.5 F (36.9 C) 98.8 F (37.1 C) 97.5 F (36.4 C)  TempSrc:  Oral Oral Oral  Resp:  18 17 16   Height: 5\' 2"   (1.575 m)     Weight:   44.5 kg (98 lb 1.7 oz)   SpO2:  98% 95% 98%    Wt Readings from Last 3 Encounters:  03/12/14 44.5 kg (98 lb 1.7 oz)  01/28/14 39.066 kg (86 lb 2 oz)  01/14/14 41.334 kg (91 lb 2 oz)     Intake/Output Summary (Last 24 hours) at 03/13/14 1005 Last data filed at 03/13/14 0416  Gross per 24 hour  Intake    120 ml  Output   2350 ml  Net  -2230 ml     Physical Exam  Awake Alert, Oriented X 3, No new F.N deficits, Normal affect Lindsey Gomez.AT,PERRAL Supple Neck,No JVD, No cervical lymphadenopathy appriciated.  Symmetrical Chest wall movement, Good air movement bilaterally, CTAB RRR,No Gallops,Rubs or new Murmurs, No Parasternal Heave +ve B.Sounds, Abd Soft, No tenderness, No organomegaly appriciated, No rebound - guarding or rigidity. No Cyanosis, Clubbing or edema, No new Rash or bruise      Data Review   Micro Results No results found for this or any previous visit (from the past 240 hour(s)).  Radiology Reports US Renal  03/12/2014   CLINICAL DATA:  Acute renal failure.  Diabetes.  EXAM: RENAL/URINARY TRACT ULTRASOUND COMPLETE  COMPARISON:  CT 12/31/2013  FINDINGS: Right Kidney: 11.6 cm. No hydronephrosis. Mildly increased echogenicity. Lower pole 9 mm lesion which likely a cyst or minimally complex cyst.  Left Kidney: 11.7 cm. No hydronephrosis. Lower pole left renal calcification likely represents a stone at 11 mm. An upper pole 9 mm lesion is hypoechoic and favored to represent a cyst or minimally complex cyst. An interpolar left renal lesion measures 1.8 cm and is also most consistent with a cyst or minimally complex cyst.  Bladder: Foley catheter identified within. The bladder is not entirely decompressed.  IMPRESSION: 1.  No hydronephrosis. 2. Mildly increased renal echogenicity, which may represent medical renal disease. 3. Left nephrolithiasis. 4. Foley catheter in place. Urinary bladder not decompressed. Correlate with Foley function.   Electronically Signed    By: Abigail Miyamoto M.D.   On: 03/12/2014 13:51     CBC  Recent Labs Lab 03/11/14 1305 03/12/14 0620  WBC 9.5 4.5  HGB 11.9* 9.9*  HCT 35.1* 29.7*  PLT 153 91*  MCV 89.5 90.0  MCH 30.4 30.0  MCHC 33.9 33.3  RDW 15.5 15.5  LYMPHSABS 1.9  --   MONOABS 0.6  --   EOSABS 0.2  --   BASOSABS 0.0  --     Chemistries   Recent Labs Lab 03/11/14 1305 03/12/14 0630 03/13/14 0440  NA 127* 136 139  K 5.0 5.1 3.8  CL 90* 102 106  CO2 23 22 22   GLUCOSE 426* 160* 76  BUN 75* 72* 62*  CREATININE 4.02* 3.82* 3.31*  CALCIUM 14.5* 12.8* 11.8*  AST 23  --   --   ALT 31  --   --   ALKPHOS 94  --   --   BILITOT 0.6  --   --    ------------------------------------------------------------------------------------------------------------------ estimated creatinine clearance is 11.7 mL/min (by C-G formula based on Cr of 3.31). ------------------------------------------------------------------------------------------------------------------ No results for input(s): HGBA1C in the last 72 hours. ------------------------------------------------------------------------------------------------------------------ No results for input(s): CHOL, HDL, LDLCALC, TRIG, CHOLHDL, LDLDIRECT in the last 72 hours. ------------------------------------------------------------------------------------------------------------------ No results for input(s): TSH, T4TOTAL, T3FREE, THYROIDAB in the last 72 hours.  Invalid input(s): FREET3 ------------------------------------------------------------------------------------------------------------------ No results for input(s): VITAMINB12, FOLATE, FERRITIN, TIBC, IRON, RETICCTPCT in the last 72 hours.  Coagulation profile No results for input(s): INR, PROTIME in the last 168 hours.  No results for input(s): DDIMER in the last 72 hours.  Cardiac Enzymes No results for input(s): CKMB, TROPONINI, MYOGLOBIN in the last 168 hours.  Invalid input(s):  CK ------------------------------------------------------------------------------------------------------------------ Invalid input(s): POCBNP     Time Spent in minutes   35   Shandy Checo K M.D on 03/13/2014 at 10:05 AM  Between 7am to 7pm - Pager - [phone]  After 7pm go to [email or web address] - [facility] Office  [phone]

## 2014-03-13 NOTE — Progress Notes (Signed)
Physical Therapy Treatment Patient Details Name: Lindsey Gomez MRN: [id number] DOB: 24-May-1945 Today's Date: 03/13/2014    History of Present Illness Pt is a 69 y.o. female, diabetes mellitus type 2, chronic pancreatitis s/p partial pancreatectomy, fibromyalgia, migraine headaches, GERD, history of right-sided breast cancer, and essential hypertension, who was noticed that her sugars have been running around 300 for the last 2-3 weeks PTA, she has been getting gradually weak with poor balance, went to see her PCP where her blood work indicated acute renal failure and she was sent to the ER.  Pt dx with Nonketotic hyperosmolar state causing dehydration and acute renal failure and hyponatremia as well as UTI.     PT Comments    Pt is self limiting with her mobility and is at a very high fall risk even with the use of RW (which was significantly better than without RW).  She is reluctantly agreeable to use RW at home due to high fall risk and is aware that she is falling posteriorly, but is unable to correct it without external assist and an assistive device.  Pt is refusing SNF, so recommending HHPT f/u and full time RW use.  She states her husband currently uses a cane, so she can use RW at home.  PT will continue to follow acutely.   Follow Up Recommendations  SNF (pt refusing SNF, open to HHPT)     Equipment Recommendations  Other (comment) (husband uses a cane, she will borrow his walker)    Recommendations for Other Services   NA     Precautions / Restrictions Precautions Precautions: Fall Precaution Comments: pt very unsteady on her feet, posterior preference in standing and with gait.     Mobility  Transfers Overall transfer level: Needs assistance Equipment used: None;Rolling walker (2 wheeled) Transfers: Sit to/from Stand Sit to Stand: Min guard         General transfer comment: Pt relying on lower extremity support on chair for balance during transitions as well as heavy  reliance on upper extremity support to control transitions.  Min guard assist to stand from both recliner and toilet for balance and safety.   Ambulation/Gait Ambulation/Gait assistance: Mod assist;Min guard Ambulation Distance (Feet): 100 Feet Assistive device: Rolling walker (2 wheeled);None Gait Pattern/deviations: Step-through pattern;Shuffle (posterior lean with sway back posture) Gait velocity: Decreased Gait velocity interpretation: Below normal speed for age/gender General Gait Details: Pt ambulated to bathroom without an assistive device (holding with one hand to IV pole) and she needed mod assist to maintain balance (posterior lean with sway back posture) and to anteriorly weight shift over her feet.  She was reaching for every stabilizing surface with passed with her free hand. After toileting we switched to RW and she did much better min guard assist for balance and safety during gait, especially while turning.  I had a long discussion with her about using the RW at home.  Her husband has one he is not using.            Balance Overall balance assessment: Needs assistance Sitting-balance support: Feet supported;Single extremity supported Sitting balance-Leahy Scale: Fair Sitting balance - Comments: pt needs one hand supported while preforming LE exercises to prevent LOB posteriorly in sitting without back on recliner chair (unsupported).  Postural control: Posterior lean Standing balance support: Bilateral upper extremity supported;Single extremity supported;No upper extremity supported Standing balance-Leahy Scale: Poor Standing balance comment: pt needs external assist to maintain balance in standing.  Cognition Arousal/Alertness: Lethargic Behavior During Therapy: WFL for tasks assessed/performed Overall Cognitive Status: No family/caregiver present to determine baseline cognitive functioning                      Exercises General  Exercises - Upper Extremity Shoulder Flexion: AROM;Both;10 reps;Seated Elbow Flexion: AROM;Both;10 reps;Seated General Exercises - Lower Extremity Long Arc Quad: AROM;Both;10 reps;Seated Hip ABduction/ADduction: AROM;Both;10 reps;Seated (adduction only against pillow for resistance) Hip Flexion/Marching: AROM;Both;10 reps;Seated Toe Raises: AROM;Both;10 reps;Seated Heel Raises: AROM;Both;10 reps;Seated        Pertinent Vitals/Pain Pain Assessment: Faces Faces Pain Scale: Hurts even more Pain Location: headache Pain Descriptors / Indicators: Aching Pain Intervention(s): Limited activity within patient's tolerance;Monitored during session;Repositioned (RN reports she was already given pain meds. )           PT Goals (current goals can now be found in the care plan section) Acute Rehab PT Goals Patient Stated Goal: Return home Progress towards PT goals: Progressing toward goals    Frequency  Min 3X/week    PT Plan Current plan remains appropriate       End of Session   Activity Tolerance: Patient limited by fatigue Patient left: in chair;with call bell/phone within reach;with nursing/sitter in room     Time: 1147-1220 PT Time Calculation (min) (ACUTE ONLY): 33 min  Charges:  $Gait Training: 8-22 mins $Therapeutic Exercise: 8-22 mins                      Nicolaus Andel B. Mason, [place], DPT [phone]   03/13/2014, 1:23 PM

## 2014-03-14 LAB — BASIC METABOLIC PANEL
ANION GAP: 8 (ref 5–15)
BUN: 52 mg/dL — AB (ref 6–23)
CALCIUM: 11 mg/dL — AB (ref 8.4–10.5)
CO2: 23 mmol/L (ref 19–32)
Chloride: 109 mEq/L (ref 96–112)
Creatinine, Ser: 2.77 mg/dL — ABNORMAL HIGH (ref 0.50–1.10)
GFR calc non Af Amer: 17 mL/min — ABNORMAL LOW (ref >90)
GFR, EST AFRICAN AMERICAN: 19 mL/min — AB (ref >90)
Glucose, Bld: 48 mg/dL — ABNORMAL LOW (ref 70–99)
Potassium: 3.2 mmol/L — ABNORMAL LOW (ref 3.5–5.1)
SODIUM: 140 mmol/L (ref 135–145)

## 2014-03-14 LAB — URINE CULTURE
CULTURE: NO GROWTH
Colony Count: NO GROWTH

## 2014-03-14 LAB — GLUCOSE, CAPILLARY
GLUCOSE-CAPILLARY: 186 mg/dL — AB (ref 70–99)
Glucose-Capillary: 60 mg/dL — ABNORMAL LOW (ref 70–99)
Glucose-Capillary: 87 mg/dL (ref 70–99)
Glucose-Capillary: 77 mg/dL (ref 70–99)
Glucose-Capillary: 194 mg/dL — ABNORMAL HIGH (ref 70–99)

## 2014-03-14 LAB — HEMOGLOBIN A1C
Hgb A1c MFr Bld: 8.5 % — ABNORMAL HIGH (ref <5.7)
Hgb A1c MFr Bld: 8.8 % — ABNORMAL HIGH (ref <5.7)
MEAN PLASMA GLUCOSE: 197 mg/dL — AB (ref <117)
Mean Plasma Glucose: 206 mg/dL — ABNORMAL HIGH (ref <117)

## 2014-03-14 LAB — OSMOLALITY: OSMOLALITY: 318 mosm/kg — AB (ref 275–300)

## 2014-03-14 LAB — OSMOLALITY, URINE: Osmolality, Ur: 259 mOsm/kg — ABNORMAL LOW (ref 390–1090)

## 2014-03-14 MED ORDER — POTASSIUM CHLORIDE IN NACL 40-0.9 MEQ/L-% IV SOLN
INTRAVENOUS | Status: AC
  Administered 2014-03-14 (×2): 75 mL/h via INTRAVENOUS
  Filled 2014-03-14 (×2): qty 1000

## 2014-03-14 MED ORDER — ACETAMINOPHEN 325 MG PO TABS
650.0000 mg | ORAL_TABLET | Freq: Four times a day (QID) | ORAL | Status: DC | PRN
  Administered 2014-03-14: 650 mg via ORAL
  Filled 2014-03-14: qty 2

## 2014-03-14 MED ORDER — POTASSIUM CHLORIDE CRYS ER 20 MEQ PO TBCR
20.0000 meq | EXTENDED_RELEASE_TABLET | Freq: Once | ORAL | Status: AC
  Administered 2014-03-14: 20 meq via ORAL
  Filled 2014-03-14: qty 1

## 2014-03-14 NOTE — Clinical Social Work Note (Signed)
CSW received consult for SNF, please refer to CSW note 1/1, and PT note 1/2, patient refuses SNF. No needs. CSW signing off.  [place], [place] Weekend Clinical Social Worker [phone]

## 2014-03-14 NOTE — Progress Notes (Signed)
Hypoglycemic Event  CBG: 60   Treatment: 15 GM carbohydrate snack  Symptoms: None  Follow-up CBG: Time:0900 CBG Result:83  Possible Reasons for Event: Unknown  Comments/MD notified: MD aware     Lindsey Gomez A  Remember to initiate Hypoglycemia Order Set & complete

## 2014-03-14 NOTE — Progress Notes (Signed)
Patient Demographics  Ruthia Person, is a 69 y.o. female, DOB - 08/09/1945, CBJ:[id number]  Admit date - 03/11/2014   Admitting Physician Thurnell Lose, MD  Outpatient Primary MD for the patient is Mathews Argyle, MD  LOS - 3   Chief Complaint  Patient presents with  . Abnormal Lab      Summary  Jianni Shelden is a 69 y.o. female, diabetes mellitus type 2, chronic pancreatitis it is post partial pancreatectomy, fibromyalgia, migraine headaches, GERD, history of right-sided breast cancer, essential hypertension, GERD who was noticed that her sugars have been running around 300 for the last 2-3 weeks, she has been getting gradually weak with poor balance, went to see her PCP where her blood work indicated acute renal failure and she was sent to the ER.   In the ER acute renal failure was confirmed, sugars were 426 she had evidence of nonketotic hyperosmolar state and I was called to admit the patient. Patient currently denies any headache fever chills, no chest pain palpitations cough phlegm, no diarrhea, no dysuria, no focal weakness. In the ER bladder scan postvoid showed 300 mL of urine and she had no R to P. Foley catheter was placed. Subsequently she has been hydrated and now her creatinine is improving, she is off of insulin drip, Foley has been removed and she is voiding reasonably well.   She has diffuse placement, will go home with PT, have discussed her case with husband and daughter wish a discharge on either the fourth or fifth.    Subjective:   Berneta Sconyers today has, No headache, No chest pain, No abdominal pain - No Nausea, No new weakness tingling or numbness, No Cough - SOB. Feels better.  Assessment & Plan     1. Nonketotic hyperosmolar state causing dehydration and acute  renal failure and hyponatremia. Improved after IV glucose stabilizer, IV fluids, avoiding nephrotoxins. Foley was placed in the ER which has now been removed with stable post void residuals. BMP improving continue to monitor on gentle hydration. Stable renal ultrasound.  Lab Results  Component Value Date   HGBA1C 7.0* 09/30/2013      2. 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 Resolved after glucose stabilizer, placed on Lantus and sliding scale. We will discontinue Lantus as a.m. CBGs and continue to drop. Question dietary compliance at home.   CBG (last 3)   Recent Labs  03/13/14 2033 03/14/14 0729 03/14/14 0837  GLUCAP 153* 60* 87    Lab Results  Component Value Date   HGBA1C 7.0* 09/30/2013     3. Essential hypertension. Continue home dose beta blocker will add Norvasc for better control.    4. Chronic pancreatitis. No abdominal pain, stable. Supportive care.    5. Fibromyalgia. Continue Lyrica stable.    6. History of smoking. Continue nicotine patch, quit 6 weeks ago, counseled.    7. UTI. Empiric Rocephin, monitor cultures.    8. Chronic hydrocortisone use. Discussed with patient and husband, they do not know why she uses this medication. We will continue for now.    9. Mild nausea. Likely due to obstipation, resolved post bowel regimen, Zofran when necessary, stable abdominal x-ray.    10. Hypokalemia. Replaced.     Code  Status:  DNR  Family Communication: Husband , daughter  Disposition Plan: HHPT   Procedures    Renal US    Consults      Medications  Scheduled Meds: . amLODipine  10 mg Oral Daily  . atenolol  100 mg Oral Daily  . atorvastatin  10 mg Oral Daily  . bisacodyl  10 mg Rectal Daily  . calcitRIOL  1 mcg Oral Daily  . cefTRIAXone (ROCEPHIN)  IV  1 g Intravenous Q24H  . dicyclomine  10 mg Oral TID AC  . docusate sodium  200 mg Oral BID  . ferrous sulfate  325 mg Oral Q breakfast  . heparin  5,000 Units  Subcutaneous 3 times per day  . hydrocortisone  10 mg Oral BID  . insulin aspart  0-9 Units Subcutaneous TID WC  . LORazepam  1 mg Oral TID  . nicotine  7 mg Transdermal Q24H  . pantoprazole  40 mg Oral Daily  . saccharomyces boulardii  250 mg Oral BID  . sodium chloride  3 mL Intravenous Q12H  . topiramate  100 mg Oral BID  . Vortioxetine HBr  20 mg Oral Daily   Continuous Infusions: . 0.9 % NaCl with KCl 40 mEq / L 75 mL/hr (03/14/14 0805)   PRN Meds:.acetaminophen, alum & mag hydroxide-simeth, dextrose, guaiFENesin-dextromethorphan, HYDROcodone-acetaminophen, loperamide, ondansetron **OR** ondansetron (ZOFRAN) IV, polyethylene glycol, prochlorperazine, triamcinolone ointment  DVT Prophylaxis    Heparin    Lab Results  Component Value Date   PLT 91* 03/12/2014    Antibiotics     Anti-infectives    Start     Dose/Rate Route Frequency Ordered Stop   03/12/14 1000  cefTRIAXone (ROCEPHIN) 1 g in dextrose 5 % 50 mL IVPB - Premix     1 g100 mL/hr over 30 Minutes Intravenous Every 24 hours 03/12/14 0948 03/15/14 0959          Objective:   Filed Vitals:   03/13/14 0930 03/13/14 1832 03/13/14 2039 03/14/14 0513  BP: 132/73 112/63 144/76 147/88  Pulse: 64 69 73 67  Temp: 97.7 F (36.5 C) 97.4 F (36.3 C) 99.2 F (37.3 C) 98.4 F (36.9 C)  TempSrc: Oral Oral Oral Oral  Resp: 18 18 17 16   Height:      Weight:   45.723 kg (100 lb 12.8 oz)   SpO2: 97% 98% 97% 98%    Wt Readings from Last 3 Encounters:  03/13/14 45.723 kg (100 lb 12.8 oz)  01/28/14 39.066 kg (86 lb 2 oz)  01/14/14 41.334 kg (91 lb 2 oz)     Intake/Output Summary (Last 24 hours) at 03/14/14 0853 Last data filed at 03/14/14 0300  Gross per 24 hour  Intake   5685 ml  Output   1326 ml  Net   4359 ml     Physical Exam  Awake Alert, Oriented X 3, No new F.N deficits, Normal affect .AT,PERRAL Supple Neck,No JVD, No cervical lymphadenopathy appriciated.  Symmetrical Chest wall movement, Good air  movement bilaterally, CTAB RRR,No Gallops,Rubs or new Murmurs, No Parasternal Heave +ve B.Sounds, Abd Soft, No tenderness, No organomegaly appriciated, No rebound - guarding or rigidity. No Cyanosis, Clubbing or edema, No new Rash or bruise      Data Review   Micro Results No results found for this or any previous visit (from the past 240 hour(s)).  Radiology Reports US Renal  03/12/2014   CLINICAL DATA:  Acute renal failure.  Diabetes.  EXAM: RENAL/URINARY TRACT ULTRASOUND  COMPLETE  COMPARISON:  CT 12/31/2013  FINDINGS: Right Kidney: 11.6 cm. No hydronephrosis. Mildly increased echogenicity. Lower pole 9 mm lesion which likely a cyst or minimally complex cyst.  Left Kidney: 11.7 cm. No hydronephrosis. Lower pole left renal calcification likely represents a stone at 11 mm. An upper pole 9 mm lesion is hypoechoic and favored to represent a cyst or minimally complex cyst. An interpolar left renal lesion measures 1.8 cm and is also most consistent with a cyst or minimally complex cyst.  Bladder: Foley catheter identified within. The bladder is not entirely decompressed.  IMPRESSION: 1.  No hydronephrosis. 2. Mildly increased renal echogenicity, which may represent medical renal disease. 3. Left nephrolithiasis. 4. Foley catheter in place. Urinary bladder not decompressed. Correlate with Foley function.   Electronically Signed   By: Abigail Miyamoto M.D.   On: 03/12/2014 13:51   Dg Abd Portable 1v  03/13/2014   CLINICAL DATA:  Acute generalized abdominal pain.  EXAM: PORTABLE ABDOMEN - 1 VIEW  COMPARISON:  CT abdomen and pelvis 12/31/2013. Two-view abdomen x-ray 10/01/2013.  FINDINGS: Bowel gas pattern unremarkable without evidence of obstruction or significant ileus. Expected stool burden in the colon. Surgical clips in the right upper quadrant from prior cholecystectomy. Surgical clips in the left upper quadrant related to a prior distal pancreatectomy as noted on the prior CT. No suggestion of free air on  the supine image. Regional skeleton intact.  IMPRESSION: No acute abdominal abnormality.   Electronically Signed   By: Evangeline Dakin M.D.   On: 03/13/2014 10:56     CBC  Recent Labs Lab 03/11/14 1305 03/12/14 0620  WBC 9.5 4.5  HGB 11.9* 9.9*  HCT 35.1* 29.7*  PLT 153 91*  MCV 89.5 90.0  MCH 30.4 30.0  MCHC 33.9 33.3  RDW 15.5 15.5  LYMPHSABS 1.9  --   MONOABS 0.6  --   EOSABS 0.2  --   BASOSABS 0.0  --     Chemistries   Recent Labs Lab 03/11/14 1305 03/12/14 0630 03/13/14 0440 03/14/14 0530  NA 127* 136 139 140  K 5.0 5.1 3.8 3.2*  CL 90* 102 106 109  CO2 23 22 22 23   GLUCOSE 426* 160* 76 48*  BUN 75* 72* 62* 52*  CREATININE 4.02* 3.82* 3.31* 2.77*  CALCIUM 14.5* 12.8* 11.8* 11.0*  AST 23  --   --   --   ALT 31  --   --   --   ALKPHOS 94  --   --   --   BILITOT 0.6  --   --   --    ------------------------------------------------------------------------------------------------------------------ estimated creatinine clearance is 14.4 mL/min (by C-G formula based on Cr of 2.77). ------------------------------------------------------------------------------------------------------------------ No results for input(s): HGBA1C in the last 72 hours. ------------------------------------------------------------------------------------------------------------------ No results for input(s): CHOL, HDL, LDLCALC, TRIG, CHOLHDL, LDLDIRECT in the last 72 hours. ------------------------------------------------------------------------------------------------------------------ No results for input(s): TSH, T4TOTAL, T3FREE, THYROIDAB in the last 72 hours.  Invalid input(s): FREET3 ------------------------------------------------------------------------------------------------------------------ No results for input(s): VITAMINB12, FOLATE, FERRITIN, TIBC, IRON, RETICCTPCT in the last 72 hours.  Coagulation profile No results for input(s): INR, PROTIME in the last 168  hours.  No results for input(s): DDIMER in the last 72 hours.  Cardiac Enzymes No results for input(s): CKMB, TROPONINI, MYOGLOBIN in the last 168 hours.  Invalid input(s): CK ------------------------------------------------------------------------------------------------------------------ Invalid input(s): POCBNP     Time Spent in minutes   35   Lala Lund K M.D on 03/14/2014 at 8:53 AM  Between 7am  to 7pm - Pager - [phone]  After 7pm go to [email or web address] - [facility] Office  [phone]

## 2014-03-15 LAB — BASIC METABOLIC PANEL
ANION GAP: 7 (ref 5–15)
BUN: 44 mg/dL — ABNORMAL HIGH (ref 6–23)
CO2: 23 mmol/L (ref 19–32)
Calcium: 10.4 mg/dL (ref 8.4–10.5)
Chloride: 110 mEq/L (ref 96–112)
Creatinine, Ser: 2.4 mg/dL — ABNORMAL HIGH (ref 0.50–1.10)
GFR calc Af Amer: 23 mL/min — ABNORMAL LOW (ref >90)
GFR, EST NON AFRICAN AMERICAN: 20 mL/min — AB (ref >90)
GLUCOSE: 180 mg/dL — AB (ref 70–99)
POTASSIUM: 3.8 mmol/L (ref 3.5–5.1)
SODIUM: 140 mmol/L (ref 135–145)

## 2014-03-15 LAB — GLUCOSE, CAPILLARY
GLUCOSE-CAPILLARY: 147 mg/dL — AB (ref 70–99)
Glucose-Capillary: 276 mg/dL — ABNORMAL HIGH (ref 70–99)

## 2014-03-15 LAB — MAGNESIUM: Magnesium: 1.9 mg/dL (ref 1.5–2.5)

## 2014-03-15 MED ORDER — HYDROCODONE-ACETAMINOPHEN 5-325 MG PO TABS: 1.0000 | ORAL_TABLET | ORAL | Status: DC | PRN

## 2014-03-15 MED ORDER — LORAZEPAM 1 MG PO TABS: 1.0000 mg | ORAL_TABLET | Freq: Three times a day (TID) | ORAL | Status: DC

## 2014-03-15 MED ORDER — LORAZEPAM 1 MG PO TABS: 1.0000 mg | ORAL_TABLET | Freq: Two times a day (BID) | ORAL | Status: DC

## 2014-03-15 MED ORDER — AMLODIPINE BESYLATE 10 MG PO TABS: 10.0000 mg | ORAL_TABLET | Freq: Every day | ORAL | Status: DC

## 2014-03-15 NOTE — Discharge Instructions (Signed)
Drink 4-6 cups of water per day    Abdominal Pain Many things can cause abdominal pain. Usually, abdominal pain is not caused by a disease and will improve without treatment. It can often be observed and treated at home. Your health care provider will do a physical exam and possibly order blood tests and X-rays to help determine the seriousness of your pain. However, in many cases, more time must pass before a clear cause of the pain can be found. Before that point, your health care provider may not know if you need more testing or further treatment. HOME CARE INSTRUCTIONS  Monitor your abdominal pain for any changes. The following actions may help to alleviate any discomfort you are experiencing:  Only take over-the-counter or prescription medicines as directed by your health care provider.  Do not take laxatives unless directed to do so by your health care provider.  Try a clear liquid diet (broth, tea, or water) as directed by your health care provider. Slowly move to a bland diet as tolerated. SEEK MEDICAL CARE IF:  You have unexplained abdominal pain.  You have abdominal pain associated with nausea or diarrhea.  You have pain when you urinate or have a bowel movement.  You experience abdominal pain that wakes you in the night.  You have abdominal pain that is worsened or improved by eating food.  You have abdominal pain that is worsened with eating fatty foods.  You have a fever. SEEK IMMEDIATE MEDICAL CARE IF:   Your pain does not go away within 2 hours.  You keep throwing up (vomiting).  Your pain is felt only in portions of the abdomen, such as the right side or the left lower portion of the abdomen.  You pass bloody or black tarry stools. MAKE SURE YOU:  Understand these instructions.   Will watch your condition.   Will get help right away if you are not doing well or get worse.  Document Released: 12/06/2004 Document Revised: 03/03/2013 Document Reviewed:  11/05/2012 [facility] Patient Information 2015 [place], [place]. This information is not intended to replace advice given to you by your health care provider. Make sure you discuss any questions you have with your health care provider.

## 2014-03-15 NOTE — Discharge Summary (Addendum)
Physician Discharge Summary  Lindsey Gomez RDE:[id number] DOB: Jun 06, 1945 DOA: 03/11/2014  PCP: Mathews Argyle, MD  Admit date: 03/11/2014 Discharge date: 03/15/2014  Recommendations for Outpatient Follow-up:  1. Pt will need to follow up with PCP in 2-3 weeks post discharge 2. Please obtain BMP to evaluate electrolytes and kidney function, calcium and potassium level  3. Please also check CBC to evaluate Hg and Hct levels 4. Please note that Norvasc was added for better blood pressure control 5. Pt also asked about hydrocortisone tablet she is taking at home but is not sure why is she on the medication, pt advised to continue taking it and to discuss with PCP  Discharge Diagnoses:  Principal Problem:   ARF (acute renal failure) Active Problems:   TOBACCO USE DISORDER/SMOKER-SMOKING CESSATION DISCUSSED   Essential hypertension   PANCREATITIS, CHRONIC   GERD (gastroesophageal reflux disease)   Acute renal failure   DM2 (diabetes mellitus, type 2)   Type 2 diabetes mellitus with hyperosmolarity without nonketotic hyperglycemic-hyperosmolar coma (nkhhc)  Discharge Condition: Stable  Diet recommendation: Heart healthy diet discussed in details   History of present illness:   69 y.o. Female, with diabetes mellitus type 2, chronic pancreatitis status post partial pancreatectomy, fibromyalgia, migraine headaches, GERD, history of right-sided breast cancer, essential hypertension, GERD who presented to [facility] after noticing that her sugars have been running around 300 for the last 2-3 weeks, she has been getting gradually weaker with poor balance, went to see her PCP where her blood work indicated acute renal failure and she was sent to the ER.   In the ER acute renal failure was confirmed, sugars were 426 she had evidence of nonketotic hyperosmolar state and I was called to admit the patient. Patient currently denies any headaches, fevers, chills, no chest pain, palpitations, cough,  phlegm, no diarrhea, no dysuria, no focal weakness. In the ER bladder scan postvoid showed 300 mL. Foley catheter was placed. Subsequently she has been hydrated and now her creatinine is improving, she is off of insulin drip, Foley has been removed and she is voiding reasonably well.  Hospital Course:  1. Nonketotic hyperosmolar state causing dehydration and acute renal failure and hyponatremia. Improved after IV glucose stabilizer, IV fluids, avoiding nephrotoxins. Foley was placed in the ER which has now been removed with stable post void residuals. BMP indicating cr trending down   2. 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 Resolved after glucose stabilizer, Lantus and sliding scale. Question dietary compliance at home. Continue home medical regimen upon discharge   3. Essential hypertension. Continue home dose beta blocker, please note that Norvasc was added for better blood pressure control   4. Chronic pancreatitis. No abdominal pain, stable. Supportive care.  5. Fibromyalgia. Continue Lyrica stable.  6. History of smoking. Continue nicotine patch, quit 6 weeks ago, counseled.  7. UTI. Empiric Rocephin, started but cultures with no growth, no ABX needed upon discharge   8. Chronic hydrocortisone use. Discussed with patient and husband, they do not know why she uses this medication. We will continue for now and defer evaluation to PCP  9. Mild nausea. Likely due to obstipation, resolved post bowel regimen, Zofran when necessary, stable abdominal x-ray.  10. Hypokalemia. Replaced.  11. Acute thrombocytopenia. Possibly secondary to use of Heparin for DVT prophylaxis. No signs of bleeding.   12. Anemia of chronic iron deficiency/ Continue iron supplement. Please note that drop in hg since admission likely dilutional from IVF pt has received.    Code  Status: DNR Family Communication: Husband , daughter Disposition Plan: HHPT   Procedures/Studies: US Renal  03/12/2014  No  hydronephrosis. Mildly increased renal echogenicity, ? medical renal disease. Left nephrolithiasis. Foley catheter in place. Urinary bladder not decompressed. Dg Abd Portable 03/13/2014  No acute abdominal abnormality.    Discharge Exam: Filed Vitals:   03/15/14 0430  BP: 170/95  Pulse: 72  Temp: 97.9 F (36.6 C)  Resp: 16   Filed Vitals:   03/14/14 0900 03/14/14 1700 03/14/14 2100 03/15/14 0430  BP: 123/86 165/96 162/93 170/95  Pulse: 77 78 73 72  Temp: 98.7 F (37.1 C) 98.9 F (37.2 C) 98.7 F (37.1 C) 97.9 F (36.6 C)  TempSrc: Oral Oral Oral Oral  Resp: 15 15 16 16   Height:      Weight:   45.5 kg (100 lb 5 oz)   SpO2: 97% 99% 97% 98%    General: Pt is alert, follows commands appropriately, not in acute distress Cardiovascular: Regular rate and rhythm, no rubs, no gallops Respiratory: Clear to auscultation bilaterally, no wheezing, no crackles, no rhonchi Abdominal: Soft, non tender, non distended, bowel sounds +, no guarding   Discharge Instructions  Discharge Instructions    Diet - low sodium heart healthy    Complete by:  As directed      Increase activity slowly    Complete by:  As directed             Medication List    TAKE these medications        amLODipine 10 MG tablet  Commonly known as:  NORVASC  Take 1 tablet (10 mg total) by mouth daily.     atenolol 100 MG tablet  Commonly known as:  TENORMIN  Take 1 tablet (100 mg total) by mouth every morning.     atorvastatin 10 MG tablet  Commonly known as:  LIPITOR  Take 1 tablet (10 mg total) by mouth daily.     BRINTELLIX 20 MG Tabs  Generic drug:  Vortioxetine HBr  Take 20 mg by mouth daily.     calcitRIOL 0.5 MCG capsule  Commonly known as:  ROCALTROL  Take 2 capsules (1 mcg total) by mouth daily.     dicyclomine 10 MG capsule  Commonly known as:  BENTYL  Take 1 capsule (10 mg total) by mouth 3 (three) times daily before meals.     docusate sodium 100 MG capsule  Commonly known as:   COLACE  Take 100 mg by mouth daily as needed for mild constipation.     ferrous sulfate 325 (65 FE) MG tablet  Take 325 mg by mouth daily with breakfast.     GLUCAGON EMERGENCY 1 MG injection  Generic drug:  glucagon  Inject 1 mg into the vein once as needed (for glucose).     hydrocortisone 10 MG tablet  Commonly known as:  CORTEF  Take 1 tablet (10 mg total) by mouth 2 (two) times daily.     insulin aspart 100 UNIT/ML FlexPen  Commonly known as:  NOVOLOG  Inject 0-7 Units into the skin 3 (three) times daily with meals. 70-120 = 0 units, 121-150 = 1 unit, 151-200 = 2 units, 201-250 = 3 units, 251-300 = 5 units, 301-350 = 7 units.     insulin detemir 100 UNIT/ML injection  Commonly known as:  LEVEMIR  Inject 8 Units into the skin at bedtime.     isometheptene-acetaminophen-dichloralphenazone 65-325-100 MG capsule  Commonly known as:  MIDRIN  Take 1 capsule by mouth 4 (four) times daily as needed for migraine. Maximum 5 capsules in 12 hours for migraine headaches, 8 capsules in 24 hours for tension headaches.     LIQUID TEARS OP  Place 1-2 drops into both eyes as needed (for dry eyes).     loperamide 2 MG capsule  Commonly known as:  IMODIUM  Take 2-4 mg by mouth as needed for diarrhea or loose stools.     LORazepam 1 MG tablet  Commonly known as:  ATIVAN  Take 1 tablet (1 mg total) by mouth 2 (two) times daily.     mometasone 50 MCG/ACT nasal spray  Commonly known as:  NASONEX  Place 2 sprays into both nostrils daily as needed (for congestion).     nicotine 7 mg/24hr patch  Commonly known as:  NICODERM CQ - dosed in mg/24 hr  Place 7 mg onto the skin daily.     omeprazole 40 MG capsule  Commonly known as:  PRILOSEC  Take 1 capsule (40 mg total) by mouth 2 (two) times daily.     ondansetron 8 MG tablet  Commonly known as:  ZOFRAN  Take 1 tablet (8 mg total) by mouth 2 (two) times daily.     saccharomyces boulardii 250 MG capsule  Commonly known as:  FLORASTOR   Take 1 capsule (250 mg total) by mouth 2 (two) times daily.     topiramate 100 MG tablet  Commonly known as:  TOPAMAX  Take 100 mg by mouth 2 (two) times daily.     triamcinolone ointment 0.1 %  Commonly known as:  KENALOG  Apply 1 application topically 2 (two) times daily as needed (itching).     zolmitriptan 5 MG tablet  Commonly known as:  ZOMIG  Take 1 tablet (5 mg total) by mouth 2 (two) times daily as needed for migraine.            Follow-up Information    Follow up with Mathews Argyle, MD.   Specialty:  Internal Medicine   Contact information:   [place] [place] [address] [phone]        The results of significant diagnostics from this hospitalization (including imaging, microbiology, ancillary and laboratory) are listed below for reference.     Microbiology: Recent Results (from the past 240 hour(s))  Urine culture     Status: None   Collection Time: 03/11/14  4:20 PM  Result Value Ref Range Status   Specimen Description URINE, CATHETERIZED  Final   Special Requests NONE  Final   Colony Count NO GROWTH Performed at [facility]   Final   Culture NO GROWTH Performed at [facility]   Final   Report Status 03/14/2014 FINAL  Final     Labs: Basic Metabolic Panel:  Recent Labs Lab 03/11/14 1305 03/12/14 0630 03/13/14 0440 03/14/14 0530 03/15/14 0516  NA 127* 136 139 140 140  K 5.0 5.1 3.8 3.2* 3.8  CL 90* 102 106 109 110  CO2 23 22 22 23 23   GLUCOSE 426* 160* 76 48* 180*  BUN 75* 72* 62* 52* 44*  CREATININE 4.02* 3.82* 3.31* 2.77* 2.40*  CALCIUM 14.5* 12.8* 11.8* 11.0* 10.4  MG  --   --   --   --  1.9   Liver Function Tests:  Recent Labs Lab 03/11/14 1305  AST 23  ALT 31  ALKPHOS 94  BILITOT 0.6  PROT 6.7  ALBUMIN 3.4*  CBC:  Recent Labs Lab 03/11/14 1305 03/12/14 0620  WBC 9.5 4.5  NEUTROABS 6.8  --   HGB 11.9* 9.9*  HCT 35.1* 29.7*  MCV 89.5 90.0  PLT 153 91*    CBG:  Recent Labs Lab 03/14/14 0837 03/14/14 1141 03/14/14 1647 03/14/14 2123 03/15/14 0726  GLUCAP 87 77 186* 194* 147*     SIGNED: Time coordinating discharge: Over 30 minutes  MAGICK-Talayah Picardi, MD  [facility] 03/15/2014, 8:50 AM Pager [phone]  If 7PM-7AM, please contact night-coverage [email or web address] Password [email or web address]

## 2014-03-15 NOTE — Care Management Note (Signed)
CARE MANAGEMENT NOTE 03/15/2014  Patient:  Lindsey Gomez, Lindsey Gomez   Account Number:  [id number]  Date Initiated:  03/15/2014  Documentation initiated by:  Jolon Degante  Subjective/Objective Assessment:   CM following for progression and d/c planning.     Action/Plan:   CM met with pt who plans to d/c to home, pt states that she has 24/7 care with an aide, which is privately paid. She wishes to use [facility] for [facility], SW and PT as she has used this agency previously. Will not order [facility] aide as pt has this service.   Anticipated DC Date:  03/15/2014   Anticipated DC Plan:  [place]         [facility] Choice  HOME HEALTH   Choice offered to / List presented to:  C-1 Patient        [place] arranged  HH-1 RN  [place].   Status of service:  Completed, signed off Medicare Important Message given?  NO (If response is "NO", the following Medicare IM given date fields will be blank) Date Medicare IM given:  03/15/2014 Medicare IM given by:  Dammon Makarewicz Date Additional Medicare IM given:   Additional Medicare IM given by:    Discharge Disposition:  [place]  Per UR Regulation:    If discussed at Long Length of Stay Meetings, dates discussed:    Comments:

## 2014-03-15 NOTE — Progress Notes (Signed)
Patient Discharge: Disposition: Patient discharged to home with caregiver. Education:  Patient and caregiver educated on medications, prescriptions, discharge instructions and follow-up appointments. IV: Discontinued before discharge. Transportation: Patient transported in w/c accompanied by staff and caregiver. Belongings: Patient took all her belongings with her.

## 2014-03-16 NOTE — Progress Notes (Signed)
UR Completed Lakely Elmendorf Graves-Bigelow, RN,BSN [phone]  

## 2014-03-18 ENCOUNTER — Ambulatory Visit (INDEPENDENT_AMBULATORY_CARE_PROVIDER_SITE_OTHER): Payer: BLUE CROSS/BLUE SHIELD | Admitting: Neurology

## 2014-03-18 ENCOUNTER — Telehealth: Payer: Self-pay | Admitting: Neurology

## 2014-03-18 ENCOUNTER — Encounter: Payer: Self-pay | Admitting: Neurology

## 2014-03-18 VITALS — BP 117/71 | HR 69 | Ht 62.0 in | Wt 91.4 lb

## 2014-03-18 DIAGNOSIS — R413 Other amnesia: Secondary | ICD-10-CM

## 2014-03-18 DIAGNOSIS — G43909 Migraine, unspecified, not intractable, without status migrainosus: Secondary | ICD-10-CM

## 2014-03-18 DIAGNOSIS — R269 Unspecified abnormalities of gait and mobility: Secondary | ICD-10-CM

## 2014-03-18 DIAGNOSIS — G43109 Migraine with aura, not intractable, without status migrainosus: Secondary | ICD-10-CM

## 2014-03-18 MED ORDER — ZOLMITRIPTAN 5 MG PO TABS: 5.0000 mg | ORAL_TABLET | Freq: Two times a day (BID) | ORAL | Status: DC | PRN

## 2014-03-18 MED ORDER — MEMANTINE HCL ER 28 MG PO CP24: 28.0000 mg | ORAL_CAPSULE | Freq: Every day | ORAL | Status: DC

## 2014-03-18 MED ORDER — MEMANTINE HCL ER 7 MG PO CP24: ORAL_CAPSULE | ORAL | Status: DC

## 2014-03-18 NOTE — Progress Notes (Signed)
Reason for visit: Headache  Lindsey Gomez is an 69 y.o. female  History of present illness:  Lindsey Gomez is a 69 year old right-handed [place] female with a history of chronic daily headaches. The patient was recently in the hospital on 03/11/2014 with acute renal failure. The patient has a history of diabetes. She also is taking a lot of nonsteroidal anti-inflammatory medications on a daily basis for her headache. She was told come off of these medications. The patient drinks coffee at least 2 cups a day. She has chronic nausea. She has gained 1 pound since last seen. The patient received a Botox injection in October 2015, and the family indicates that this did offer some benefit for her. She has Midrin to take for migraine, and Zomig has been helpful as well. She remains on Topamax taking 100 mg twice daily. She comes to this office for an evaluation. There have been ongoing issues with memory that has gradually worsened over time. The patient primarily complains of short-term memory issues. She has not had any recent falls, but she does have some gait instability.  Past Medical History  Diagnosis Date  . Depression   . HTN (hypertension)   . Chronic pain   . Fibromyalgia   . Migraines   . History of breast cancer     Right  . Chronic pancreatitis   . Vitamin D deficiency   . GERD (gastroesophageal reflux disease)   . Finger dislocation 2009    L 3rd  . COPD (chronic obstructive pulmonary disease)   . Anemia   . Fractured sternum 11/2008  . Osteoporosis     Reclast too expensive  . Barrett's esophagus   . Chronic fatigue   . Diverticulosis   . Opioid dependence   . 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 Abnormality of gait 10/12/2013  . Anxiety   . Depression   . Convulsions/seizures 10/12/2013    Possible benzodiazepine withdrawal seizure, x1-no further seizure activity   . Cancer     HX OF BREAST CANCER -chemo, radiation  . Type II or unspecified type diabetes mellitus without mention  of complication, not stated as uncontrolled     Past Surgical History  Procedure Laterality Date  . Nasal sinus surgery    . Pancreatectomy      40%  . Cholecystectomy    . Appendectomy    . Abdominal hysterectomy    . Lobectomy Left 2005    granulomatous lesion.   . Breast lumpectomy Left   . Ercp N/A 10/05/2013    Procedure: ENDOSCOPIC RETROGRADE CHOLANGIOPANCREATOGRAPHY (ERCP);  Surgeon: Ladene Artist, MD;  Location[facility] ENDOSCOPY;  Service: Endoscopy;  Laterality: N/A;  . Cataract extraction Bilateral   . Botox injection      recent botox injection 12-23-13 injection for Migraines  . Cataract extraction, bilateral Bilateral     bilateral  . Eye surgery      1 eye "membrane surgery"  . Ercp N/A 01/07/2014    Procedure: ENDOSCOPIC RETROGRADE CHOLANGIOPANCREATOGRAPHY (ERCP);  Surgeon: Inda Castle, MD;  Location: [facility] ENDOSCOPY;  Service: Endoscopy;  Laterality: N/A;  . Spyglass cholangioscopy N/A 01/07/2014    Procedure: QBHALPFX CHOLANGIOSCOPY;  Surgeon: Inda Castle, MD;  Location: [facility] ENDOSCOPY;  Service: Endoscopy;  Laterality: N/A;    Family History  Problem Relation Age of Onset  . Heart attack Brother   . COPD Father   . Heart disease Father   . Leukemia Mother   . Diabetes Maternal  Uncle   . Dementia Sister   . Alzheimer's disease Sister   . Heart attack Brother   . Colon cancer Neg Hx   . Colon polyps Neg Hx     Social history:  reports that she quit smoking about 7 weeks ago. Her smoking use included Cigarettes. She has a 40 pack-year smoking history. She has never used smokeless tobacco. She reports that she does not drink alcohol or use illicit drugs.    Allergies  Allergen Reactions  . Milnacipran Swelling and Other (See Comments)    Headache and hand swelling   . Rizatriptan Benzoate Nausea And Vomiting  . Gabapentin Other (See Comments)    Weak muscles  . Moxifloxacin Other (See Comments)    Unknown  . Sulfonamide Derivatives Rash  .  Venlafaxine Other (See Comments)    Unknown    Medications:  Current Outpatient Prescriptions on File Prior to Visit  Medication Sig Dispense Refill  . amLODipine (NORVASC) 10 MG tablet Take 1 tablet (10 mg total) by mouth daily. 30 tablet 1  . atenolol (TENORMIN) 100 MG tablet Take 1 tablet (100 mg total) by mouth every morning. 30 tablet 11  . atorvastatin (LIPITOR) 10 MG tablet Take 1 tablet (10 mg total) by mouth daily. 30 tablet 11  . dicyclomine (BENTYL) 10 MG capsule Take 1 capsule (10 mg total) by mouth 3 (three) times daily before meals. 90 capsule 1  . docusate sodium (COLACE) 100 MG capsule Take 100 mg by mouth daily as needed for mild constipation.    . ferrous sulfate 325 (65 FE) MG tablet Take 325 mg by mouth daily with breakfast.    . glucagon (GLUCAGON EMERGENCY) 1 MG injection Inject 1 mg into the vein once as needed (for glucose).    . insulin aspart (NOVOLOG) 100 UNIT/ML FlexPen Inject 0-7 Units into the skin 3 (three) times daily with meals. 70-120 = 0 units, 121-150 = 1 unit, 151-200 = 2 units, 201-250 = 3 units, 251-300 = 5 units, 301-350 = 7 units.    . insulin detemir (LEVEMIR) 100 UNIT/ML injection Inject 8 Units into the skin at bedtime.     [name] loperamide (IMODIUM) 2 MG capsule Take 2-4 mg by mouth as needed for diarrhea or loose stools.     [name] LORazepam (ATIVAN) 1 MG tablet Take 1 tablet (1 mg total) by mouth 2 (two) times daily. 60 tablet 3  . mometasone (NASONEX) 50 MCG/ACT nasal spray Place 2 sprays into both nostrils daily as needed (for congestion).     . nicotine (NICODERM CQ - DOSED IN MG/24 HR) 7 mg/24hr patch Place 7 mg onto the skin daily.    [name] omeprazole (PRILOSEC) 40 MG capsule Take 1 capsule (40 mg total) by mouth 2 (two) times daily. 60 capsule 11  . ondansetron (ZOFRAN) 8 MG tablet Take 1 tablet (8 mg total) by mouth 2 (two) times daily. 40 tablet 1  . Polyvinyl Alcohol (LIQUID TEARS OP) Place 1-2 drops into both eyes as needed (for dry eyes).    .  saccharomyces boulardii (FLORASTOR) 250 MG capsule Take 1 capsule (250 mg total) by mouth 2 (two) times daily. 60 capsule 0  . topiramate (TOPAMAX) 100 MG tablet Take 100 mg by mouth 2 (two) times daily.    [name] triamcinolone (KENALOG) 0.1 % ointment Apply 1 application topically 2 (two) times daily as needed (itching).     . Vortioxetine HBr (BRINTELLIX) 20 MG TABS Take 20 mg by mouth daily.  No current facility-administered medications on file prior to visit.    ROS:  Out of a complete 14 system review of symptoms, the patient complains only of the following symptoms, and all other reviewed systems are negative.  Light sensitivity Cough, wheezing, shortness of breath Cold intolerance Nausea Memory loss, dizziness, headache, numbness, weakness Agitation, behavior problems, depression, anxiety  Blood pressure 117/71, pulse 69, height 5\' 2"  (1.575 m), weight 91 lb 6.4 oz (41.459 kg).  Physical Exam  General: The patient is alert and cooperative at the time of the examination.  Skin: No significant peripheral edema is noted.   Neurologic Exam  Mental status: The patient did not wish to have the MMSE today.  Cranial nerves: Facial symmetry is present. Speech is normal, no aphasia or dysarthria is noted. Extraocular movements are full. Visual fields are full. Mild masking of the face is seen.  Motor: The patient has good strength in all 4 extremities.  Sensory examination: Soft touch sensation is symmetric on the face, arms, and legs.  Coordination: The patient has good finger-nose-finger and heel-to-shin bilaterally.  Gait and station: The patient has a slightly stooped, shuffling gait. When standing up, the patient does have some tendency to lean backwards. Romberg is negative. No drift is seen.  Reflexes: Deep tendon reflexes are symmetric.   Assessment/Plan:  1. Chronic daily headache  2. Memory disturbance  3. Gait disturbance, parkinsonism  4. Acute renal  failure  The patient is amenable to going on a medication for memory at this time, we will start [place]. The patient has gained some benefit from the Botox with the migraine headaches. I will write a prescription for Zomig today. She can take Tylenol and Midrin as well if needed. Many of her headaches are early morning headaches, and she may be at risk for rebound headaches. She is to cut back on her caffeine intake. In the future, Seroquel may be added in the evening hours to help her sleep and help with her headache. She will follow-up in 4 months. She will need to be followed for her parkinsonism.  Jill Alexanders MD 03/18/2014 8:08 PM  [facility] [address] [place], [place] [address]  Phone [phone] Fax [phone]

## 2014-03-18 NOTE — Telephone Encounter (Signed)
Pt is daughter is calling to state that pt has appointment today and she is out of town and she has some concerns.  The pt is being very stubborn and she is refusing to drink water like she should and other things.  She would like to get a call from Dr. Jannifer Franklin to let him know the changes about the patient.  She seems like she is fine when she is out but at home she acts different (stubborn).  Please call daughter after your visit with her today.

## 2014-03-18 NOTE — Patient Instructions (Signed)

## 2014-03-18 NOTE — Telephone Encounter (Signed)
I called the daughter, left a message. She is to call us back if she needs to talk with me.

## 2014-03-19 ENCOUNTER — Other Ambulatory Visit: Payer: Self-pay | Admitting: Physician Assistant

## 2014-03-19 ENCOUNTER — Other Ambulatory Visit: Payer: Self-pay | Admitting: Neurology

## 2014-03-22 ENCOUNTER — Other Ambulatory Visit: Payer: Self-pay

## 2014-03-22 ENCOUNTER — Telehealth: Payer: Self-pay | Admitting: Neurology

## 2014-03-22 MED ORDER — ISOMETHEPTENE-APAP-DICHLORAL 65-325-100 MG PO CAPS: 1.0000 | ORAL_CAPSULE | ORAL | Status: DC | PRN

## 2014-03-22 NOTE — Telephone Encounter (Signed)
Pt is almost out of isometheptene-acetaminophen-dichloralphenazone (MIDRIN) 65-325-100 MG capsule.  States that Dr. Jannifer Franklin called medication in Friday but they have not filled it yet. According to chart, Dr. Jannifer Franklin printed today. Needs medication sent to [facility] in [place]. Please call and advise.

## 2014-03-22 NOTE — Telephone Encounter (Signed)
Rx has already been faxed to [facility].  I called back.  Got no answer.  Left message.

## 2014-03-22 NOTE — Telephone Encounter (Signed)
Rx signed and faxed.

## 2014-03-24 ENCOUNTER — Ambulatory Visit: Payer: Medicare Other | Admitting: Neurology

## 2014-04-01 ENCOUNTER — Encounter: Payer: Medicare Other | Admitting: Gastroenterology

## 2014-04-01 ENCOUNTER — Other Ambulatory Visit: Payer: Self-pay | Admitting: Geriatric Medicine

## 2014-04-01 DIAGNOSIS — R911 Solitary pulmonary nodule: Secondary | ICD-10-CM

## 2014-04-07 ENCOUNTER — Other Ambulatory Visit: Payer: Self-pay | Admitting: Geriatric Medicine

## 2014-04-07 DIAGNOSIS — Z853 Personal history of malignant neoplasm of breast: Secondary | ICD-10-CM

## 2014-04-08 ENCOUNTER — Other Ambulatory Visit: Payer: Medicare Other

## 2014-04-09 ENCOUNTER — Encounter (HOSPITAL_COMMUNITY): Payer: Self-pay

## 2014-04-09 ENCOUNTER — Inpatient Hospital Stay (HOSPITAL_COMMUNITY)
Admission: EM | Admit: 2014-04-09 | Discharge: 2014-04-15 | DRG: 177 | Disposition: A | Discharge status: Home / Self Care | Payer: Medicare Other | Attending: Internal Medicine | Admitting: Internal Medicine

## 2014-04-09 ENCOUNTER — Emergency Department (HOSPITAL_COMMUNITY): Payer: Medicare Other

## 2014-04-09 DIAGNOSIS — D509 Iron deficiency anemia, unspecified: Secondary | ICD-10-CM | POA: Diagnosis present

## 2014-04-09 DIAGNOSIS — E876 Hypokalemia: Secondary | ICD-10-CM | POA: Diagnosis present

## 2014-04-09 DIAGNOSIS — IMO0002 Reserved for concepts with insufficient information to code with codable children: Secondary | ICD-10-CM | POA: Diagnosis present

## 2014-04-09 DIAGNOSIS — J449 Chronic obstructive pulmonary disease, unspecified: Secondary | ICD-10-CM | POA: Diagnosis present

## 2014-04-09 DIAGNOSIS — J690 Pneumonitis due to inhalation of food and vomit: Principal | ICD-10-CM | POA: Diagnosis present

## 2014-04-09 DIAGNOSIS — R5382 Chronic fatigue, unspecified: Secondary | ICD-10-CM | POA: Diagnosis present

## 2014-04-09 DIAGNOSIS — Z90710 Acquired absence of both cervix and uterus: Secondary | ICD-10-CM | POA: Diagnosis not present

## 2014-04-09 DIAGNOSIS — R339 Retention of urine, unspecified: Secondary | ICD-10-CM | POA: Diagnosis present

## 2014-04-09 DIAGNOSIS — E43 Unspecified severe protein-calorie malnutrition: Secondary | ICD-10-CM | POA: Diagnosis present

## 2014-04-09 DIAGNOSIS — E11649 Type 2 diabetes mellitus with hypoglycemia without coma: Secondary | ICD-10-CM | POA: Diagnosis present

## 2014-04-09 DIAGNOSIS — Z794 Long term (current) use of insulin: Secondary | ICD-10-CM

## 2014-04-09 DIAGNOSIS — I10 Essential (primary) hypertension: Secondary | ICD-10-CM | POA: Diagnosis present

## 2014-04-09 DIAGNOSIS — Z720 Tobacco use: Secondary | ICD-10-CM

## 2014-04-09 DIAGNOSIS — M810 Age-related osteoporosis without current pathological fracture: Secondary | ICD-10-CM | POA: Diagnosis present

## 2014-04-09 DIAGNOSIS — R509 Fever, unspecified: Secondary | ICD-10-CM | POA: Diagnosis not present

## 2014-04-09 DIAGNOSIS — F411 Generalized anxiety disorder: Secondary | ICD-10-CM | POA: Diagnosis present

## 2014-04-09 DIAGNOSIS — F329 Major depressive disorder, single episode, unspecified: Secondary | ICD-10-CM | POA: Diagnosis present

## 2014-04-09 DIAGNOSIS — E1100 Type 2 diabetes mellitus with hyperosmolarity without nonketotic hyperglycemic-hyperosmolar coma (NKHHC): Secondary | ICD-10-CM | POA: Diagnosis present

## 2014-04-09 DIAGNOSIS — E1165 Type 2 diabetes mellitus with hyperglycemia: Secondary | ICD-10-CM | POA: Diagnosis present

## 2014-04-09 DIAGNOSIS — J9690 Respiratory failure, unspecified, unspecified whether with hypoxia or hypercapnia: Secondary | ICD-10-CM | POA: Diagnosis present

## 2014-04-09 DIAGNOSIS — G8929 Other chronic pain: Secondary | ICD-10-CM | POA: Diagnosis present

## 2014-04-09 DIAGNOSIS — R32 Unspecified urinary incontinence: Secondary | ICD-10-CM | POA: Diagnosis present

## 2014-04-09 DIAGNOSIS — K219 Gastro-esophageal reflux disease without esophagitis: Secondary | ICD-10-CM | POA: Diagnosis present

## 2014-04-09 DIAGNOSIS — Z853 Personal history of malignant neoplasm of breast: Secondary | ICD-10-CM

## 2014-04-09 DIAGNOSIS — J189 Pneumonia, unspecified organism: Secondary | ICD-10-CM

## 2014-04-09 DIAGNOSIS — Z87891 Personal history of nicotine dependence: Secondary | ICD-10-CM | POA: Diagnosis not present

## 2014-04-09 DIAGNOSIS — R269 Unspecified abnormalities of gait and mobility: Secondary | ICD-10-CM

## 2014-04-09 DIAGNOSIS — Z681 Body mass index (BMI) 19 or less, adult: Secondary | ICD-10-CM | POA: Diagnosis not present

## 2014-04-09 DIAGNOSIS — K861 Other chronic pancreatitis: Secondary | ICD-10-CM | POA: Diagnosis present

## 2014-04-09 DIAGNOSIS — Z9841 Cataract extraction status, right eye: Secondary | ICD-10-CM

## 2014-04-09 DIAGNOSIS — E860 Dehydration: Secondary | ICD-10-CM | POA: Diagnosis present

## 2014-04-09 DIAGNOSIS — Z888 Allergy status to other drugs, medicaments and biological substances status: Secondary | ICD-10-CM

## 2014-04-09 DIAGNOSIS — Z90411 Acquired partial absence of pancreas: Secondary | ICD-10-CM | POA: Diagnosis present

## 2014-04-09 DIAGNOSIS — Z882 Allergy status to sulfonamides status: Secondary | ICD-10-CM | POA: Diagnosis not present

## 2014-04-09 DIAGNOSIS — R413 Other amnesia: Secondary | ICD-10-CM | POA: Diagnosis present

## 2014-04-09 DIAGNOSIS — Z9049 Acquired absence of other specified parts of digestive tract: Secondary | ICD-10-CM | POA: Diagnosis present

## 2014-04-09 DIAGNOSIS — E119 Type 2 diabetes mellitus without complications: Secondary | ICD-10-CM

## 2014-04-09 DIAGNOSIS — R1310 Dysphagia, unspecified: Secondary | ICD-10-CM | POA: Diagnosis present

## 2014-04-09 DIAGNOSIS — Z9842 Cataract extraction status, left eye: Secondary | ICD-10-CM | POA: Diagnosis not present

## 2014-04-09 DIAGNOSIS — R2681 Unsteadiness on feet: Secondary | ICD-10-CM

## 2014-04-09 DIAGNOSIS — K2270 Barrett's esophagus without dysplasia: Secondary | ICD-10-CM | POA: Diagnosis present

## 2014-04-09 DIAGNOSIS — G43709 Chronic migraine without aura, not intractable, without status migrainosus: Secondary | ICD-10-CM | POA: Diagnosis present

## 2014-04-09 DIAGNOSIS — N179 Acute kidney failure, unspecified: Secondary | ICD-10-CM | POA: Diagnosis present

## 2014-04-09 DIAGNOSIS — D696 Thrombocytopenia, unspecified: Secondary | ICD-10-CM | POA: Diagnosis present

## 2014-04-09 DIAGNOSIS — E872 Acidosis: Secondary | ICD-10-CM | POA: Diagnosis present

## 2014-04-09 DIAGNOSIS — M797 Fibromyalgia: Secondary | ICD-10-CM | POA: Diagnosis present

## 2014-04-09 DIAGNOSIS — D6959 Other secondary thrombocytopenia: Secondary | ICD-10-CM | POA: Diagnosis present

## 2014-04-09 LAB — CBC WITH DIFFERENTIAL/PLATELET
Basophils Absolute: 0 10*3/uL (ref 0.0–0.1)
Basophils Relative: 0 % (ref 0–1)
Eosinophils Absolute: 0 10*3/uL (ref 0.0–0.7)
Eosinophils Relative: 0 % (ref 0–5)
HCT: 40.8 % (ref 36.0–46.0)
Hemoglobin: 13.8 g/dL (ref 12.0–15.0)
Lymphocytes Relative: 15 % (ref 12–46)
Lymphs Abs: 0.8 10*3/uL (ref 0.7–4.0)
MCH: 31.2 pg (ref 26.0–34.0)
MCHC: 33.8 g/dL (ref 30.0–36.0)
MCV: 92.3 fL (ref 78.0–100.0)
Monocytes Absolute: 0.6 10*3/uL (ref 0.1–1.0)
Monocytes Relative: 11 % (ref 3–12)
Neutro Abs: 3.9 10*3/uL (ref 1.7–7.7)
Neutrophils Relative %: 74 % (ref 43–77)
Platelets: 59 10*3/uL — ABNORMAL LOW (ref 150–400)
RBC: 4.42 MIL/uL (ref 3.87–5.11)
RDW: 15.4 % (ref 11.5–15.5)
WBC: 5.3 10*3/uL (ref 4.0–10.5)

## 2014-04-09 LAB — BASIC METABOLIC PANEL
Anion gap: 10 (ref 5–15)
BUN: 27 mg/dL — ABNORMAL HIGH (ref 6–23)
CO2: 18 mmol/L — ABNORMAL LOW (ref 19–32)
Calcium: 9.4 mg/dL (ref 8.4–10.5)
Chloride: 108 mmol/L (ref 96–112)
Creatinine, Ser: 1.27 mg/dL — ABNORMAL HIGH (ref 0.50–1.10)
GFR calc Af Amer: 50 mL/min — ABNORMAL LOW (ref >90)
GFR calc non Af Amer: 43 mL/min — ABNORMAL LOW (ref >90)
Glucose, Bld: 154 mg/dL — ABNORMAL HIGH (ref 70–99)
Potassium: 3.1 mmol/L — ABNORMAL LOW (ref 3.5–5.1)
Sodium: 136 mmol/L (ref 135–145)

## 2014-04-09 LAB — URINALYSIS, ROUTINE W REFLEX MICROSCOPIC
Bilirubin Urine: NEGATIVE
Glucose, UA: NEGATIVE mg/dL
Hgb urine dipstick: NEGATIVE
Ketones, ur: NEGATIVE mg/dL
Leukocytes, UA: NEGATIVE
Nitrite: NEGATIVE
Protein, ur: 100 mg/dL — AB
Specific Gravity, Urine: 1.017 (ref 1.005–1.030)
Urobilinogen, UA: 0.2 mg/dL (ref 0.0–1.0)
pH: 6.5 (ref 5.0–8.0)

## 2014-04-09 LAB — URINE MICROSCOPIC-ADD ON

## 2014-04-09 LAB — CBG MONITORING, ED: Glucose-Capillary: 132 mg/dL — ABNORMAL HIGH (ref 70–99)

## 2014-04-09 LAB — I-STAT CG4 LACTIC ACID, ED: Lactic Acid, Venous: 3.27 mmol/L (ref 0.5–2.0)

## 2014-04-09 LAB — GLUCOSE, CAPILLARY
GLUCOSE-CAPILLARY: 145 mg/dL — AB (ref 70–99)
Glucose-Capillary: 228 mg/dL — ABNORMAL HIGH (ref 70–99)

## 2014-04-09 MED ORDER — PANTOPRAZOLE SODIUM 40 MG PO TBEC
40.0000 mg | DELAYED_RELEASE_TABLET | Freq: Every day | ORAL | Status: DC
  Administered 2014-04-09 – 2014-04-15 (×7): 40 mg via ORAL
  Filled 2014-04-09 (×7): qty 1

## 2014-04-09 MED ORDER — GUAIFENESIN-DM 100-10 MG/5ML PO SYRP
5.0000 mL | ORAL_SOLUTION | ORAL | Status: DC | PRN
Start: 2014-04-09 — End: 2014-04-15

## 2014-04-09 MED ORDER — VANCOMYCIN HCL IN DEXTROSE 750-5 MG/150ML-% IV SOLN
750.0000 mg | Freq: Once | INTRAVENOUS | Status: AC
  Administered 2014-04-09: 750 mg via INTRAVENOUS
  Filled 2014-04-09: qty 150

## 2014-04-09 MED ORDER — DEXTROSE 5 % IV SOLN: 1.0000 g | Freq: Three times a day (TID) | INTRAVENOUS | Status: DC

## 2014-04-09 MED ORDER — ACETAMINOPHEN 650 MG RE SUPP: 650.0000 mg | Freq: Four times a day (QID) | RECTAL | Status: DC | PRN

## 2014-04-09 MED ORDER — PREGABALIN 25 MG PO CAPS
25.0000 mg | ORAL_CAPSULE | Freq: Every day | ORAL | Status: DC
  Administered 2014-04-09 – 2014-04-12 (×4): 25 mg via ORAL
  Filled 2014-04-09 (×4): qty 1

## 2014-04-09 MED ORDER — LORAZEPAM 1 MG PO TABS
1.0000 mg | ORAL_TABLET | Freq: Two times a day (BID) | ORAL | Status: DC
  Administered 2014-04-09 – 2014-04-14 (×11): 1 mg via ORAL
  Filled 2014-04-09 (×11): qty 1

## 2014-04-09 MED ORDER — ATORVASTATIN CALCIUM 10 MG PO TABS
10.0000 mg | ORAL_TABLET | Freq: Every day | ORAL | Status: DC
  Administered 2014-04-09 – 2014-04-14 (×5): 10 mg via ORAL
  Filled 2014-04-09 (×7): qty 1

## 2014-04-09 MED ORDER — HYDROCORTISONE 10 MG PO TABS
10.0000 mg | ORAL_TABLET | Freq: Every day | ORAL | Status: DC
  Administered 2014-04-09 – 2014-04-15 (×7): 10 mg via ORAL
  Filled 2014-04-09 (×7): qty 1

## 2014-04-09 MED ORDER — VANCOMYCIN HCL 500 MG IV SOLR
500.0000 mg | INTRAVENOUS | Status: AC
  Administered 2014-04-10 – 2014-04-13 (×4): 500 mg via INTRAVENOUS
  Filled 2014-04-09 (×4): qty 500

## 2014-04-09 MED ORDER — FERROUS SULFATE 325 (65 FE) MG PO TABS
325.0000 mg | ORAL_TABLET | Freq: Every day | ORAL | Status: DC
  Administered 2014-04-10 – 2014-04-15 (×5): 325 mg via ORAL
  Filled 2014-04-09 (×7): qty 1

## 2014-04-09 MED ORDER — NICOTINE 7 MG/24HR TD PT24
7.0000 mg | MEDICATED_PATCH | Freq: Every day | TRANSDERMAL | Status: DC
  Administered 2014-04-09 – 2014-04-15 (×7): 7 mg via TRANSDERMAL
  Filled 2014-04-09 (×7): qty 1

## 2014-04-09 MED ORDER — ATENOLOL 100 MG PO TABS
100.0000 mg | ORAL_TABLET | Freq: Every day | ORAL | Status: DC
  Administered 2014-04-09 – 2014-04-15 (×7): 100 mg via ORAL
  Filled 2014-04-09: qty 1
  Filled 2014-04-09: qty 2
  Filled 2014-04-09 (×6): qty 1

## 2014-04-09 MED ORDER — MEMANTINE HCL ER 28 MG PO CP24
28.0000 mg | ORAL_CAPSULE | Freq: Every day | ORAL | Status: DC
  Administered 2014-04-09 – 2014-04-12 (×4): 28 mg via ORAL
  Filled 2014-04-09 (×4): qty 1

## 2014-04-09 MED ORDER — INSULIN DETEMIR 100 UNIT/ML ~~LOC~~ SOLN
8.0000 [IU] | Freq: Every day | SUBCUTANEOUS | Status: DC
  Administered 2014-04-09 – 2014-04-10 (×2): 8 [IU] via SUBCUTANEOUS
  Filled 2014-04-09 (×3): qty 0.08

## 2014-04-09 MED ORDER — GUAIFENESIN ER 600 MG PO TB12
600.0000 mg | ORAL_TABLET | Freq: Two times a day (BID) | ORAL | Status: DC
  Administered 2014-04-09 – 2014-04-15 (×13): 600 mg via ORAL
  Filled 2014-04-09 (×14): qty 1

## 2014-04-09 MED ORDER — LOPERAMIDE HCL 2 MG PO CAPS: 2.0000 mg | ORAL_CAPSULE | ORAL | Status: DC | PRN

## 2014-04-09 MED ORDER — INSULIN ASPART 100 UNIT/ML ~~LOC~~ SOLN
0.0000 [IU] | Freq: Every day | SUBCUTANEOUS | Status: DC
  Administered 2014-04-10: 3 [IU] via SUBCUTANEOUS

## 2014-04-09 MED ORDER — VORTIOXETINE HBR 20 MG PO TABS
20.0000 mg | ORAL_TABLET | Freq: Every day | ORAL | Status: DC
  Administered 2014-04-09 – 2014-04-12 (×4): 20 mg via ORAL
  Filled 2014-04-09 (×4): qty 20

## 2014-04-09 MED ORDER — AMLODIPINE BESYLATE 10 MG PO TABS
10.0000 mg | ORAL_TABLET | Freq: Every day | ORAL | Status: DC
  Administered 2014-04-09 – 2014-04-15 (×7): 10 mg via ORAL
  Filled 2014-04-09 (×8): qty 1

## 2014-04-09 MED ORDER — DEXTROSE 5 % IV SOLN
1.0000 g | INTRAVENOUS | Status: DC
  Administered 2014-04-09 – 2014-04-11 (×3): 1 g via INTRAVENOUS
  Filled 2014-04-09 (×4): qty 1

## 2014-04-09 MED ORDER — ACETAMINOPHEN 325 MG PO TABS
650.0000 mg | ORAL_TABLET | Freq: Four times a day (QID) | ORAL | Status: DC | PRN
  Administered 2014-04-10 (×2): 650 mg via ORAL
  Administered 2014-04-11: 325 mg via ORAL
  Administered 2014-04-12 – 2014-04-15 (×3): 650 mg via ORAL
  Filled 2014-04-09 (×8): qty 2

## 2014-04-09 MED ORDER — SUMATRIPTAN SUCCINATE 50 MG PO TABS
50.0000 mg | ORAL_TABLET | Freq: Two times a day (BID) | ORAL | Status: DC | PRN
  Administered 2014-04-10 – 2014-04-14 (×4): 50 mg via ORAL
  Filled 2014-04-09 (×7): qty 1

## 2014-04-09 MED ORDER — TOPIRAMATE 100 MG PO TABS
100.0000 mg | ORAL_TABLET | Freq: Two times a day (BID) | ORAL | Status: DC
  Administered 2014-04-09 – 2014-04-12 (×7): 100 mg via ORAL
  Filled 2014-04-09 (×9): qty 1

## 2014-04-09 MED ORDER — ONDANSETRON HCL 8 MG PO TABS
8.0000 mg | ORAL_TABLET | Freq: Two times a day (BID) | ORAL | Status: DC
  Administered 2014-04-09 – 2014-04-15 (×13): 8 mg via ORAL
  Filled 2014-04-09: qty 1
  Filled 2014-04-09: qty 2
  Filled 2014-04-09 (×3): qty 1
  Filled 2014-04-09: qty 2
  Filled 2014-04-09: qty 1
  Filled 2014-04-09: qty 2
  Filled 2014-04-09 (×2): qty 1
  Filled 2014-04-09: qty 2
  Filled 2014-04-09 (×7): qty 1

## 2014-04-09 MED ORDER — ISOMETHEPTENE-APAP-DICHLORAL 65-325-100 MG PO CAPS
1.0000 | ORAL_CAPSULE | ORAL | Status: DC | PRN
  Administered 2014-04-11 – 2014-04-14 (×3): 1 via ORAL
  Filled 2014-04-09 (×3): qty 1

## 2014-04-09 MED ORDER — ONDANSETRON HCL 4 MG/2ML IJ SOLN
4.0000 mg | Freq: Once | INTRAMUSCULAR | Status: AC
  Administered 2014-04-09: 4 mg via INTRAVENOUS
  Filled 2014-04-09: qty 2

## 2014-04-09 MED ORDER — SODIUM CHLORIDE 0.9 % IV BOLUS (SEPSIS)
1000.0000 mL | Freq: Once | INTRAVENOUS | Status: AC
  Administered 2014-04-09: 1000 mL via INTRAVENOUS

## 2014-04-09 MED ORDER — DICYCLOMINE HCL 10 MG PO CAPS
10.0000 mg | ORAL_CAPSULE | Freq: Three times a day (TID) | ORAL | Status: DC
  Administered 2014-04-09 – 2014-04-15 (×17): 10 mg via ORAL
  Filled 2014-04-09 (×20): qty 1

## 2014-04-09 MED ORDER — INSULIN ASPART 100 UNIT/ML ~~LOC~~ SOLN
0.0000 [IU] | Freq: Three times a day (TID) | SUBCUTANEOUS | Status: DC
  Administered 2014-04-09: 5 [IU] via SUBCUTANEOUS
  Administered 2014-04-10: 2 [IU] via SUBCUTANEOUS
  Administered 2014-04-10 – 2014-04-11 (×3): 3 [IU] via SUBCUTANEOUS
  Administered 2014-04-11: 2 [IU] via SUBCUTANEOUS
  Administered 2014-04-12: 8 [IU] via SUBCUTANEOUS
  Administered 2014-04-13: 2 [IU] via SUBCUTANEOUS
  Administered 2014-04-13: 8 [IU] via SUBCUTANEOUS
  Administered 2014-04-15: 11 [IU] via SUBCUTANEOUS
  Administered 2014-04-15: 5 [IU] via SUBCUTANEOUS

## 2014-04-09 MED ORDER — DOCUSATE SODIUM 100 MG PO CAPS: 100.0000 mg | ORAL_CAPSULE | Freq: Every day | ORAL | Status: DC | PRN

## 2014-04-09 MED ORDER — FLUTICASONE PROPIONATE 50 MCG/ACT NA SUSP
1.0000 | Freq: Every day | NASAL | Status: DC
  Administered 2014-04-10 – 2014-04-15 (×6): 1 via NASAL
  Filled 2014-04-09 (×2): qty 16

## 2014-04-09 MED ORDER — SACCHAROMYCES BOULARDII 250 MG PO CAPS
250.0000 mg | ORAL_CAPSULE | Freq: Two times a day (BID) | ORAL | Status: DC
  Administered 2014-04-09 – 2014-04-15 (×13): 250 mg via ORAL
  Filled 2014-04-09 (×14): qty 1

## 2014-04-09 MED ORDER — STARCH (THICKENING) PO POWD
ORAL | Status: DC | PRN
  Filled 2014-04-09 (×2): qty 227

## 2014-04-09 MED ORDER — ALBUTEROL SULFATE (2.5 MG/3ML) 0.083% IN NEBU
2.5000 mg | INHALATION_SOLUTION | Freq: Three times a day (TID) | RESPIRATORY_TRACT | Status: DC
  Administered 2014-04-10 – 2014-04-13 (×10): 2.5 mg via RESPIRATORY_TRACT
  Filled 2014-04-09 (×9): qty 3

## 2014-04-09 MED ORDER — ALBUTEROL SULFATE (2.5 MG/3ML) 0.083% IN NEBU
2.5000 mg | INHALATION_SOLUTION | Freq: Four times a day (QID) | RESPIRATORY_TRACT | Status: DC
  Administered 2014-04-09 (×2): 2.5 mg via RESPIRATORY_TRACT
  Filled 2014-04-09 (×2): qty 3

## 2014-04-09 NOTE — ED Notes (Signed)
Per EMS Fever x 3 days, slurred speech since Monday but pt is not wearing her teeth. Pt has no hx of stroke. Febrile to touch. EMS temp 102.0 but home health reported 103.7. Pt in no acute distress.

## 2014-04-09 NOTE — ED Provider Notes (Signed)
CSN: [id number]     Arrival date & time 04/09/14  1002 History   First MD Initiated Contact with Patient 04/09/14 1005     Chief Complaint  Patient presents with  . Fever  . Fatigue     (Consider location/radiation/quality/duration/timing/severity/associated sxs/prior Treatment) HPI   69 year old female presenting from home accompanied by a caretaker. About a three day history of generalized weakness and cough. Patient reportedly ambulatory at baseline without much difficulty. Apparently this morning she was too weak to stand herself. Coarse sounding cough but not bringing anything up. Patient reports feeling nauseated. Incontinent of urine per caretaker over the past day which is unusual for her. Fever of 103.4 this morning. Received tylenol by EMS. Decreased appetite. Last night didn't eat dinner or even drink Boost which she typically looks forward to. Mild epigastric pain. No dysuria.   Past Medical History  Diagnosis Date  . Depression   . HTN (hypertension)   . Chronic pain   . Fibromyalgia   . Migraines   . History of breast cancer     Right  . Chronic pancreatitis   . Vitamin D deficiency   . GERD (gastroesophageal reflux disease)   . Finger dislocation 2009    L 3rd  . COPD (chronic obstructive pulmonary disease)   . Anemia   . Fractured sternum 11/2008  . Osteoporosis     Reclast too expensive  . Barrett's esophagus   . Chronic fatigue   . Diverticulosis   . Opioid dependence   . 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 Abnormality of gait 10/12/2013  . Anxiety   . Depression   . Convulsions/seizures 10/12/2013    Possible benzodiazepine withdrawal seizure, x1-no further seizure activity   . Cancer     HX OF BREAST CANCER -chemo, radiation  . Type II or unspecified type diabetes mellitus without mention of complication, not stated as uncontrolled    Past Surgical History  Procedure Laterality Date  . Nasal sinus surgery    . Pancreatectomy      40%  . Cholecystectomy     . Appendectomy    . Abdominal hysterectomy    . Lobectomy Left 2005    granulomatous lesion.   . Breast lumpectomy Left   . Ercp N/A 10/05/2013    Procedure: ENDOSCOPIC RETROGRADE CHOLANGIOPANCREATOGRAPHY (ERCP);  Surgeon: Ladene Artist, MD;  Location: [facility] ENDOSCOPY;  Service: Endoscopy;  Laterality: N/A;  . Cataract extraction Bilateral   . Botox injection      recent botox injection 12-23-13 injection for Migraines  . Cataract extraction, bilateral Bilateral     bilateral  . Eye surgery      1 eye "membrane surgery"  . Ercp N/A 01/07/2014    Procedure: ENDOSCOPIC RETROGRADE CHOLANGIOPANCREATOGRAPHY (ERCP);  Surgeon: Inda Castle, MD;  Location: [facility] ENDOSCOPY;  Service: Endoscopy;  Laterality: N/A;  . Spyglass cholangioscopy N/A 01/07/2014    Procedure: [id number] CHOLANGIOSCOPY;  Surgeon: Inda Castle, MD;  Location: [facility] ENDOSCOPY;  Service: Endoscopy;  Laterality: N/A;   Family History  Problem Relation Age of Onset  . Heart attack Brother   . COPD Father   . Heart disease Father   . Leukemia Mother   . Diabetes Maternal Uncle   . Dementia Sister   . Alzheimer's disease Sister   . Heart attack Brother   . Colon cancer Neg Hx   . Colon polyps Neg Hx    History  Substance Use Topics  . Smoking status: Former Smoker --  1.00 packs/day for 40 years    Types: Cigarettes    Quit date: 01/25/2014  . Smokeless tobacco: Never Used     Comment: started patch 01/25/14  . Alcohol Use: No   OB History    No data available     Review of Systems  All systems reviewed and negative, other than as noted in HPI.   Allergies  Milnacipran; Rizatriptan benzoate; Gabapentin; Moxifloxacin; Sulfonamide derivatives; and Venlafaxine  Home Medications   Prior to Admission medications   Medication Sig Start Date End Date Taking? Authorizing Provider  amLODipine (NORVASC) 10 MG tablet Take 1 tablet (10 mg total) by mouth daily. 03/15/14   Theodis Blaze, MD  atenolol (TENORMIN) 100 MG  tablet Take 1 tablet (100 mg total) by mouth every morning. 05/15/13   Aleksei Plotnikov V, MD  atorvastatin (LIPITOR) 10 MG tablet Take 1 tablet (10 mg total) by mouth daily. 05/15/13 05/18/14  Aleksei Plotnikov V, MD  dicyclomine (BENTYL) 10 MG capsule TAKE 1 CAPSULE (10 MG TOTAL) BY MOUTH 3 (THREE) TIMES DAILY BEFORE MEALS. 03/22/14   Amy S Esterwood, PA-C  docusate sodium (COLACE) 100 MG capsule Take 100 mg by mouth daily as needed for mild constipation.    Historical Provider, MD  ferrous sulfate 325 (65 FE) MG tablet Take 325 mg by mouth daily with breakfast.    Historical Provider, MD  glucagon (GLUCAGON EMERGENCY) 1 MG injection Inject 1 mg into the vein once as needed (for glucose).    Historical Provider, MD  hydrocortisone (CORTEF) 10 MG tablet Take 10 mg by mouth daily. 03/17/14   Historical Provider, MD  insulin aspart (NOVOLOG) 100 UNIT/ML FlexPen Inject 0-7 Units into the skin 3 (three) times daily with meals. 70-120 = 0 units, 121-150 = 1 unit, 151-200 = 2 units, 201-250 = 3 units, 251-300 = 5 units, 301-350 = 7 units. 01/13/13   Renato Shin, MD  insulin detemir (LEVEMIR) 100 UNIT/ML injection Inject 8 Units into the skin at bedtime.  05/27/13   Renato Shin, MD  isometheptene-acetaminophen-dichloralphenazone (MIDRIN) [phone] MG capsule Take 1 capsule by mouth as needed for headache. Maximum 5 capsules in 12 hours for migraine headaches, 8 capsules in 24 hours for tension headaches. 03/22/14   Kathrynn Ducking, MD  loperamide (IMODIUM) 2 MG capsule Take 2-4 mg by mouth as needed for diarrhea or loose stools.  10/07/13   Historical Provider, MD  LORazepam (ATIVAN) 1 MG tablet Take 1 tablet (1 mg total) by mouth 2 (two) times daily. 03/15/14   Theodis Blaze, MD  LYRICA 25 MG capsule Take 25 mg by mouth daily. 03/07/14   Historical Provider, MD  memantine (NAMENDA XR) 28 MG CP24 24 hr capsule Take 1 capsule (28 mg total) by mouth daily. 03/18/14   Kathrynn Ducking, MD  memantine (NAMENDA XR) 7 MG CP24  24 hr capsule One tablet daily for one week, then take 2 capsules daily for one week, then take 3 capsules daily for one week. 03/18/14   Kathrynn Ducking, MD  mometasone (NASONEX) 50 MCG/ACT nasal spray Place 2 sprays into both nostrils daily as needed (for congestion).     Historical Provider, MD  nicotine (NICODERM CQ - DOSED IN MG/24 HR) 7 mg/24hr patch Place 7 mg onto the skin daily.    Historical Provider, MD  omeprazole (PRILOSEC) 40 MG capsule Take 1 capsule (40 mg total) by mouth 2 (two) times daily. 07/17/13   Cassandria Anger, MD  ondansetron (  ZOFRAN) 8 MG tablet Take 1 tablet (8 mg total) by mouth 2 (two) times daily. 02/10/14   Inda Castle, MD  Polyvinyl Alcohol (LIQUID TEARS OP) Place 1-2 drops into both eyes as needed (for dry eyes).    Historical Provider, MD  saccharomyces boulardii (FLORASTOR) 250 MG capsule Take 1 capsule (250 mg total) by mouth 2 (two) times daily. 01/14/14   Amy S Esterwood, PA-C  topiramate (TOPAMAX) 100 MG tablet Take 100 mg by mouth 2 (two) times daily.    Historical Provider, MD  triamcinolone (KENALOG) 0.1 % ointment Apply 1 application topically 2 (two) times daily as needed (itching).     Historical Provider, MD  Vortioxetine HBr (BRINTELLIX) 20 MG TABS Take 20 mg by mouth daily.    Historical Provider, MD  zolmitriptan (ZOMIG) 5 MG tablet Take 1 tablet (5 mg total) by mouth 2 (two) times daily as needed for migraine. 03/18/14   Kathrynn Ducking, MD   BP 90/57 mmHg  Pulse 85  Temp(Src) 98.9 F (37.2 C) (Oral)  Resp 23  Wt 86 lb (39.009 kg)  SpO2 96% Physical Exam  Constitutional: No distress.  Laying in bed with eyes open. Frail appearing.   HENT:  Head: Normocephalic and atraumatic.  Eyes: Conjunctivae are normal. Pupils are equal, round, and reactive to light. Right eye exhibits no discharge. Left eye exhibits no discharge.  Neck: Neck supple.  Cardiovascular: Normal rate, regular rhythm and normal heart sounds.  Exam reveals no gallop and no  friction rub.   No murmur heard. Pulmonary/Chest: Effort normal and breath sounds normal.  Mild tachypnea. No adventitious breath sounds appreciated.   Abdominal: Soft. She exhibits no distension. There is no tenderness.  Musculoskeletal: She exhibits no edema or tenderness.  Neurological: She is alert. She exhibits normal muscle tone.  Somewhat slow to respond to questions, but speech understandable and answers appropriate. Follows commands. No focal motor deficit.   Skin: Skin is warm and dry. She is not diaphoretic.  Psychiatric: She has a normal mood and affect. Her behavior is normal. Thought content normal.  Nursing note and vitals reviewed.   ED Course  Procedures (including critical care time) Labs Review Labs Reviewed  CBG MONITORING, ED - Abnormal; Notable for the following:    Glucose-Capillary 132 (*)    All other components within normal limits  I-STAT CG4 LACTIC ACID, ED - Abnormal; Notable for the following:    Lactic Acid, Venous 3.27 (*)    All other components within normal limits  CULTURE, BLOOD (ROUTINE X 2)  CULTURE, BLOOD (ROUTINE X 2)  CBC WITH DIFFERENTIAL/PLATELET  BASIC METABOLIC PANEL  URINALYSIS, ROUTINE W REFLEX MICROSCOPIC    Imaging Review No results found.   EKG Interpretation None      MDM   Final diagnoses:  Fever  HCAP (healthcare-associated pneumonia)    68yF with fever, cough and generalized weakness. CXR with R sided pneumonia. Recent admit. Abx for possibly HCAP. Admission.     Virgel Manifold, MD 04/09/14 1146

## 2014-04-09 NOTE — Progress Notes (Signed)
UR completed.  Elleen Coulibaly, RN BSN MHA CCM Trauma/Neuro ICU Case Manager [phone]  

## 2014-04-09 NOTE — ED Notes (Signed)
EMS reported that lethargy and slurred speech started Monday, but pt had equal grips and no deficits on seen. Neuro checks were normal.

## 2014-04-09 NOTE — ED Notes (Signed)
Per EMS, it was reported to EMS from home health that pt has hx of substance abuse.

## 2014-04-09 NOTE — Evaluation (Signed)
Clinical/Bedside Swallow Evaluation Patient Details  Name: Lindsey Gomez MRN: [id number] Date of Birth: Jul 11, 1945  Today's Date: 04/09/2014 Time: SLP Start Time (ACUTE ONLY): 1429 SLP Stop Time (ACUTE ONLY): 1450 SLP Time Calculation (min) (ACUTE ONLY): 21 min  Past Medical History:  Past Medical History  Diagnosis Date  . Depression   . HTN (hypertension)   . Chronic pain   . Fibromyalgia   . Migraines   . History of breast cancer     Right  . Chronic pancreatitis   . Vitamin D deficiency   . GERD (gastroesophageal reflux disease)   . Finger dislocation 2009    L 3rd  . COPD (chronic obstructive pulmonary disease)   . Anemia   . Fractured sternum 11/2008  . Osteoporosis     Reclast too expensive  . Barrett's esophagus   . Chronic fatigue   . Diverticulosis   . Opioid dependence   . 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 Abnormality of gait 10/12/2013  . Anxiety   . Depression   . Convulsions/seizures 10/12/2013    Possible benzodiazepine withdrawal seizure, x1-no further seizure activity   . Cancer     HX OF BREAST CANCER -chemo, radiation  . Type II or unspecified type diabetes mellitus without mention of complication, not stated as uncontrolled    Past Surgical History:  Past Surgical History  Procedure Laterality Date  . Nasal sinus surgery    . Pancreatectomy      40%  . Cholecystectomy    . Appendectomy    . Abdominal hysterectomy    . Lobectomy Left 2005    granulomatous lesion.   . Breast lumpectomy Left   . Ercp N/A 10/05/2013    Procedure: ENDOSCOPIC RETROGRADE CHOLANGIOPANCREATOGRAPHY (ERCP);  Surgeon: Ladene Artist, MD;  Location: [facility] ENDOSCOPY;  Service: Endoscopy;  Laterality: N/A;  . Cataract extraction Bilateral   . Botox injection      recent botox injection 12-23-13 injection for Migraines  . Cataract extraction, bilateral Bilateral     bilateral  . Eye surgery      1 eye "membrane surgery"  . Ercp N/A 01/07/2014    Procedure: ENDOSCOPIC RETROGRADE  CHOLANGIOPANCREATOGRAPHY (ERCP);  Surgeon: Inda Castle, MD;  Location: [facility] ENDOSCOPY;  Service: Endoscopy;  Laterality: N/A;  . Spyglass cholangioscopy N/A 01/07/2014    Procedure: YKDXIPJA CHOLANGIOSCOPY;  Surgeon: Inda Castle, MD;  Location: [facility] ENDOSCOPY;  Service: Endoscopy;  Laterality: N/A;   HPI:  69 year old female with uncontrolled type 2 diabetes mellitus, chronic pancreatitis status post partial pancreatectomy, chronic migraine headaches, GERD, fibromyalgia, history of right-sided breast cancer, essential hypertension, COPD with ongoing tobacco use and mild memory impairment who was recently hospitalized for nonketotic hyper osmolar state and acute kidney injury was brought to the ED for fever of 103.54F and CXR concerning for RUL PNA.   Assessment / Plan / Recommendation Clinical Impression  Pt has a congested cough at baseline that occurs frequently before, during, and after PO intake, making it difficult to fully assess tolerance. However, coughing is often noted immediately following sips of thin liquids, with wet vocal quality requiring Mod cues from SLP for throat clear to return to baseline. Recommend Dys 2 diet to faciltiate mastication in the absence of her dentures with nectar thick liquids. SLP to follow for tolerance and to determine if further objective testing is warranted.    Aspiration Risk  Moderate    Diet Recommendation Dysphagia 2 (Fine chop);Nectar-thick liquid   Liquid Administration via:  Cup Medication Administration: Whole meds with puree Supervision: Patient able to self feed;Full supervision/cueing for compensatory strategies Compensations: Slow rate;Small sips/bites Postural Changes and/or Swallow Maneuvers: Seated upright 90 degrees;Upright 30-60 min after meal    Other  Recommendations Oral Care Recommendations: Oral care BID Other Recommendations: Order thickener from pharmacy;Remove water pitcher;Prohibited food (jello, ice cream, thin soups)    Follow Up Recommendations   (tbd)    Frequency and Duration min 2x/week  2 weeks   Pertinent Vitals/Pain n/a    SLP Swallow Goals     Swallow Study Prior Functional Status       General HPI: 69 year old female with uncontrolled type 2 diabetes mellitus, chronic pancreatitis status post partial pancreatectomy, chronic migraine headaches, GERD, fibromyalgia, history of right-sided breast cancer, essential hypertension, COPD with ongoing tobacco use and mild memory impairment who was recently hospitalized for nonketotic hyper osmolar state and acute kidney injury was brought to the ED for fever of 103.70F and CXR concerning for RUL PNA. Type of Study: Bedside swallow evaluation Previous Swallow Assessment: none in chart Diet Prior to this Study: Regular;Thin liquids Temperature Spikes Noted: Yes Respiratory Status: Room air History of Recent Intubation: No Behavior/Cognition: Alert;Cooperative;Pleasant mood;Requires cueing Oral Cavity - Dentition: Edentulous;Dentures, not available Self-Feeding Abilities: Able to feed self Patient Positioning: Upright in bed Baseline Vocal Quality: Clear Volitional Cough: Congested    Oral/Motor/Sensory Function Overall Oral Motor/Sensory Function: Appears within functional limits for tasks assessed   Ice Chips Ice chips: Not tested   Thin Liquid Thin Liquid: Impaired Presentation: Cup;Self Fed;Straw Pharyngeal  Phase Impairments: Suspected delayed Swallow;Cough - Immediate;Cough - Delayed;Wet Vocal Quality    Nectar Thick Nectar Thick Liquid: Impaired Presentation: Cup;Self Fed;Straw Pharyngeal Phase Impairments: Cough - Delayed   Honey Thick Honey Thick Liquid: Not tested   Puree Puree: Impaired Presentation: Self Fed;Spoon Pharyngeal Phase Impairments: Suspected delayed Swallow;Cough - Delayed   Solid   GO    Solid: Impaired Presentation: Self Fed Oral Phase Impairments: Impaired mastication Pharyngeal Phase Impairments: Cough -  Delayed      Germain Osgood, M.A. CCC-SLP [phone]  Germain Osgood 04/09/2014,3:01 PM

## 2014-04-09 NOTE — H&P (Signed)
[facility] History and Physical  Lindsey Gomez HDQ:[id number] DOB: 08-20-1945 DOA: 04/09/2014  Referring physician: Virgel Manifold, MD  PCP: Mathews Argyle, MD   Chief Complaint:  Generalized weakness with productive cough for past 2-3 days Fever since one day  HPI:  69 year old female with uncontrolled type 2 diabetes mellitus, chronic pancreatitis status post partial pancreatectomy, chronic migraine headaches, GERD, fibromyalgia, history of right-sided breast cancer, essential hypertension, COPD with ongoing tobacco use and mild memory impairment who was recently hospitalized for nonketotic hyper osmolar state and acute kidney injury was brought to the ED for fever of 103.86F this morning. Patient ports having productive cough of green sputum for the past 2-3 days with some dyspnea on exertion and generalized fatigue. She also reports pleuritic chest pain with cough. Patient reports nausea but no vomiting. As per her caregiver patient has not been able to get out of bed since this morning. She also reports poor appetite. Has not been anything since last night. As per her caregiver patient also has been incontinent of urine since yesterday which is unusual for her. She denies any worsened headache, dizziness, blurred vision, vomiting, palpitations, orthopnea, PND, abdominal pain, diarrhea or dysuria.   Course in the ED Patient's vitals were stable and she was afebrile. O2 sat was maintained on room air. Blood will done showed a 5.3, hemoglobin of 13.8 and platelets of 59 (normal at baseline). Chemistry showed sodium of 136, potassium of 3.1, chloride of 108, CO2 of 18, BUN of 27 and creatinine 1.27. Glucose of 154. Chest x-ray showed right upper lobe pneumonia and chronic emphysematous changes.  Patient given empiric vancomycin and cefepime for healthcare associated pneumonia. Blood cultures sent from the ED and hospitalists admission requested to medical for   Review of Systems:   Constitutional: Denies fever, chills, poor appetite and fatigue HEENT: Denies visual or hearing symptoms, congestion, sore throat, rhinorrhea, difficulty swallowing, neck pain Respiratory: Dyspnea on exertion, productive cough, chest tightness, denies wheezing  Cardiovascular: Denies chest pain, palpitations and leg swelling.  Gastrointestinal: Denies nausea, vomiting, abdominal pain, diarrhea, constipation, blood in stool and abdominal distention.  Genitourinary: urinary incontinence+, Denies dysuria, urgency, frequency, hematuria, flank pain and difficulty urinating.  Endocrine: Denies: hot or cold intolerance,  polyuria, polydipsia. Musculoskeletal: Denies myalgias, back pain, joint swelling, arthralgias  Skin: Denies , rash and wound.  Neurological: Generalized weakness, Denies dizziness, seizures, syncope, light-headedness, numbness and headaches.  Psychiatric/Behavioral: Denies confusion or sleep disturbance  Past Medical History  Diagnosis Date  . Depression   . HTN (hypertension)   . Chronic pain   . Fibromyalgia   . Migraines   . History of breast cancer     Right  . Chronic pancreatitis   . Vitamin D deficiency   . GERD (gastroesophageal reflux disease)   . Finger dislocation 2009    L 3rd  . COPD (chronic obstructive pulmonary disease)   . Anemia   . Fractured sternum 11/2008  . Osteoporosis     Reclast too expensive  . Barrett's esophagus   . Chronic fatigue   . Diverticulosis   . Opioid dependence   . 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 Abnormality of gait 10/12/2013  . Anxiety   . Depression   . Convulsions/seizures 10/12/2013    Possible benzodiazepine withdrawal seizure, x1-no further seizure activity   . Cancer     HX OF BREAST CANCER -chemo, radiation  . Type II or unspecified type diabetes mellitus without mention of complication, not stated as uncontrolled  Past Surgical History  Procedure Laterality Date  . Nasal sinus surgery    . Pancreatectomy      40%   . Cholecystectomy    . Appendectomy    . Abdominal hysterectomy    . Lobectomy Left 2005    granulomatous lesion.   . Breast lumpectomy Left   . Ercp N/A 10/05/2013    Procedure: ENDOSCOPIC RETROGRADE CHOLANGIOPANCREATOGRAPHY (ERCP);  Surgeon: Ladene Artist, MD;  Location: [facility] ENDOSCOPY;  Service: Endoscopy;  Laterality: N/A;  . Cataract extraction Bilateral   . Botox injection      recent botox injection 12-23-13 injection for Migraines  . Cataract extraction, bilateral Bilateral     bilateral  . Eye surgery      1 eye "membrane surgery"  . Ercp N/A 01/07/2014    Procedure: ENDOSCOPIC RETROGRADE CHOLANGIOPANCREATOGRAPHY (ERCP);  Surgeon: Inda Castle, MD;  Location: [facility] ENDOSCOPY;  Service: Endoscopy;  Laterality: N/A;  . Spyglass cholangioscopy N/A 01/07/2014    Procedure: DXIPJASN CHOLANGIOSCOPY;  Surgeon: Inda Castle, MD;  Location: [facility] ENDOSCOPY;  Service: Endoscopy;  Laterality: N/A;   Social History:  reports that she quit smoking about 2 months ago. Her smoking use included Cigarettes. She has a 40 pack-year smoking history. She has never used smokeless tobacco. She reports that she does not drink alcohol or use illicit drugs.  Allergies  Allergen Reactions  . Milnacipran Swelling and Other (See Comments)    Headache and hand swelling   . Rizatriptan Benzoate Nausea And Vomiting  . Gabapentin Other (See Comments)    Weak muscles  . Moxifloxacin Other (See Comments)    Unknown  . Sulfonamide Derivatives Rash  . Venlafaxine Other (See Comments)    Unknown    Family History  Problem Relation Age of Onset  . Heart attack Brother   . COPD Father   . Heart disease Father   . Leukemia Mother   . Diabetes Maternal Uncle   . Dementia Sister   . Alzheimer's disease Sister   . Heart attack Brother   . Colon cancer Neg Hx   . Colon polyps Neg Hx     Prior to Admission medications   Medication Sig Start Date End Date Taking? Authorizing Provider  amLODipine  (NORVASC) 10 MG tablet Take 1 tablet (10 mg total) by mouth daily. 03/15/14   Theodis Blaze, MD  atenolol (TENORMIN) 100 MG tablet Take 1 tablet (100 mg total) by mouth every morning. 05/15/13   Aleksei Plotnikov V, MD  atorvastatin (LIPITOR) 10 MG tablet Take 1 tablet (10 mg total) by mouth daily. 05/15/13 05/18/14  Aleksei Plotnikov V, MD  dicyclomine (BENTYL) 10 MG capsule TAKE 1 CAPSULE (10 MG TOTAL) BY MOUTH 3 (THREE) TIMES DAILY BEFORE MEALS. 03/22/14   Amy S Esterwood, PA-C  docusate sodium (COLACE) 100 MG capsule Take 100 mg by mouth daily as needed for mild constipation.    Historical Provider, MD  ferrous sulfate 325 (65 FE) MG tablet Take 325 mg by mouth daily with breakfast.    Historical Provider, MD  glucagon (GLUCAGON EMERGENCY) 1 MG injection Inject 1 mg into the vein once as needed (for glucose).    Historical Provider, MD  hydrocortisone (CORTEF) 10 MG tablet Take 10 mg by mouth daily. 03/17/14   Historical Provider, MD  insulin aspart (NOVOLOG) 100 UNIT/ML FlexPen Inject 0-7 Units into the skin 3 (three) times daily with meals. 70-120 = 0 units, 121-150 = 1 unit, 151-200 = 2 units, 201-250 =  3 units, 251-300 = 5 units, 301-350 = 7 units. 01/13/13   Renato Shin, MD  insulin detemir (LEVEMIR) 100 UNIT/ML injection Inject 8 Units into the skin at bedtime.  05/27/13   Renato Shin, MD  isometheptene-acetaminophen-dichloralphenazone (MIDRIN) [phone] MG capsule Take 1 capsule by mouth as needed for headache. Maximum 5 capsules in 12 hours for migraine headaches, 8 capsules in 24 hours for tension headaches. 03/22/14   Kathrynn Ducking, MD  loperamide (IMODIUM) 2 MG capsule Take 2-4 mg by mouth as needed for diarrhea or loose stools.  10/07/13   Historical Provider, MD  LORazepam (ATIVAN) 1 MG tablet Take 1 tablet (1 mg total) by mouth 2 (two) times daily. 03/15/14   Theodis Blaze, MD  LYRICA 25 MG capsule Take 25 mg by mouth daily. 03/07/14   Historical Provider, MD  memantine (NAMENDA XR) 28 MG CP24  24 hr capsule Take 1 capsule (28 mg total) by mouth daily. 03/18/14   Kathrynn Ducking, MD  memantine (NAMENDA XR) 7 MG CP24 24 hr capsule One tablet daily for one week, then take 2 capsules daily for one week, then take 3 capsules daily for one week. 03/18/14   Kathrynn Ducking, MD  mometasone (NASONEX) 50 MCG/ACT nasal spray Place 2 sprays into both nostrils daily as needed (for congestion).     Historical Provider, MD  nicotine (NICODERM CQ - DOSED IN MG/24 HR) 7 mg/24hr patch Place 7 mg onto the skin daily.    Historical Provider, MD  omeprazole (PRILOSEC) 40 MG capsule Take 1 capsule (40 mg total) by mouth 2 (two) times daily. 07/17/13   Aleksei Plotnikov V, MD  ondansetron (ZOFRAN) 8 MG tablet Take 1 tablet (8 mg total) by mouth 2 (two) times daily. 02/10/14   Inda Castle, MD  Polyvinyl Alcohol (LIQUID TEARS OP) Place 1-2 drops into both eyes as needed (for dry eyes).    Historical Provider, MD  saccharomyces boulardii (FLORASTOR) 250 MG capsule Take 1 capsule (250 mg total) by mouth 2 (two) times daily. 01/14/14   Amy S Esterwood, PA-C  topiramate (TOPAMAX) 100 MG tablet Take 100 mg by mouth 2 (two) times daily.    Historical Provider, MD  triamcinolone (KENALOG) 0.1 % ointment Apply 1 application topically 2 (two) times daily as needed (itching).     Historical Provider, MD  Vortioxetine HBr (BRINTELLIX) 20 MG TABS Take 20 mg by mouth daily.    Historical Provider, MD  zolmitriptan (ZOMIG) 5 MG tablet Take 1 tablet (5 mg total) by mouth 2 (two) times daily as needed for migraine. 03/18/14   Kathrynn Ducking, MD     Physical Exam:  Filed Vitals:   04/09/14 1200 04/09/14 1215 04/09/14 1230 04/09/14 1259  BP: 104/57 103/57 106/59 114/62  Pulse: 83 83 85 89  Temp:    100.2 F (37.9 C)  TempSrc:    Oral  Resp: 23 22 23 18   Height:    5\' 2"  (1.575 m)  Weight:    39.2 kg (86 lb 6.7 oz)  SpO2: 96% 97% 93% 95%    Constitutional: Vital signs reviewed. He is a thin built female in no acute  distress. Having congested cough HEENT: no pallor, no icterus, moist oral mucosa, no cervical lymphadenopathy, temporal wasting, supple neck Cardiovascular: RRR, S1 normal, S2 normal, no MRG Chest: Diminished bibasilar breath sounds with coarse sounds over right lung base gastrointestinal: Soft. Non-tender, non-distended, bowel sounds are normal,  musculoskeletal: warm, no edema Neurological:  Alert and oriented, slow speech with flat affect  Labs on Admission:  Basic Metabolic Panel:  Recent Labs Lab 04/09/14 1054  NA 136  K 3.1*  CL 108  CO2 18*  GLUCOSE 154*  BUN 27*  CREATININE 1.27*  CALCIUM 9.4   Liver Function Tests: No results for input(s): AST, ALT, ALKPHOS, BILITOT, PROT, ALBUMIN in the last 168 hours. No results for input(s): LIPASE, AMYLASE in the last 168 hours. No results for input(s): AMMONIA in the last 168 hours. CBC:  Recent Labs Lab 04/09/14 1054  WBC 5.3  NEUTROABS 3.9  HGB 13.8  HCT 40.8  MCV 92.3  PLT 59*   Cardiac Enzymes: No results for input(s): CKTOTAL, CKMB, CKMBINDEX, TROPONINI in the last 168 hours. BNP: Invalid input(s): POCBNP CBG:  Recent Labs Lab 04/09/14 1104  GLUCAP 132*    Radiological Exams on Admission: Dg Chest 2 View  04/09/2014   CLINICAL DATA:  Two day history of chest pain and fever with weakness.  EXAM: CHEST  2 VIEW  COMPARISON:  Chest CT 01/13/2014.  FINDINGS: The cardiac silhouette, mediastinal and hilar contours are normal and stable. Stable left basilar scarring changes and probable pleural thickening. Underlying emphysematous changes. There is a the right upper lobe infiltrate. The bony thorax is intact.  IMPRESSION: Right upper lobe pneumonia.  Chronic emphysematous changes and left basilar scarring.   Electronically Signed   By: Kalman Jewels M.D.   On: 04/09/2014 11:36    EKG: none  Assessment/Plan  Principal Problem:   HCAP (healthcare-associated pneumonia) Admit to MedSurg. Blood cultures sent from  the ED. Check sputum for Gram stain and culture, urine for Legionella and strep antigen and HIV antibody. -Continue empiric vancomycin and cefepime. -Supportive care with antitussives, Tylenol and antiemetics. -Symptoms concerning for aspiration pneumonia. Will request for swallow evaluation.  Active Problems:   Acute renal failure Likely secondary to dehydration. Monitor with IV fluids Avoid nephrotoxins    DM2 (diabetes mellitus, type 2) Uncontrolled with A1c greater than 8. Recent hospitalization for hyperosmolar nonketotic state. Continue home dose Lantus and sliding scale insulin    Thrombocytopenia Likely secondary to acute illness. Noted for drop in platelets during recent hospitalization as well. Avoid adding production monitor.    Anxiety state Continue home dose Ativan    Essential hypertension Blood pressure stable. 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 Stent was removed in October 2015.[name] Sees Dr Deatra Ina. On dicyclomine for ?abd cramping.      GERD (gastroesophageal reflux disease) and Barrett's esophagus Continue PPI     Abnormality of gait Follows with neurology as outpatient. PT eval      Severe protein-calorie malnutrition Patient consult    Memory impairment Mild. Continue home medication    Chronic migraine On multiple medications which will be continued. Follows with Dr. Jannifer Franklin with neurology.    Hypokalemia Replenish  COPD and ongoing tobacco use Continue home inhalers. Add prn albuterol nebs. Continue nicotine patch.   Iron def anemia Continue iron sulfate  Patient is also on daily Solu Cortef 10 mg daily. Not clear why she is on it. Need to verify with her PCP   DVT prophylaxis: SCD  Diet: Diabetic          Code Status: full code Family Communication:  None at bedside Disposition Plan:  home once improved  Louellen Molder [facility] Pager [phone]  Total time spent on admission :70 minutes  If 7PM-7AM, please contact night-coverage [email or web address] Password [email or web address] 04/09/2014, 1:19 PM

## 2014-04-09 NOTE — Progress Notes (Signed)
[facility]  Patient Status: Active (receiving services up to time of hospitalization)  [facility] is providing the following services: RN, PT and MSW  If patient discharges after hours, please call [phone].   Lindsey Gomez 04/09/2014, 8:51 PM

## 2014-04-09 NOTE — ED Notes (Signed)
Elevated CG-4 reported to Dr. Kohut 

## 2014-04-09 NOTE — ED Notes (Signed)
Pt's caregiver is at the bedside and talked with me and told me that the pt does have a drug addictions hx and that the pt is not to receive any narcotic medications.

## 2014-04-09 NOTE — Progress Notes (Signed)
ANTIBIOTIC CONSULT NOTE - INITIAL  Pharmacy Consult for vancomycin and cefepime Indication: HCAP  Allergies  Allergen Reactions  . Milnacipran Swelling and Other (See Comments)    Headache and hand swelling   . Rizatriptan Benzoate Nausea And Vomiting  . Gabapentin Other (See Comments)    Weak muscles  . Moxifloxacin Other (See Comments)    Unknown  . Sulfonamide Derivatives Rash  . Venlafaxine Other (See Comments)    Unknown    Patient Measurements: Weight: 86 lb (39.009 kg)  Vital Signs: Temp: 98.9 F (37.2 C) (01/29 1011) Temp Source: Oral (01/29 1011) BP: 106/59 mmHg (01/29 1230) Pulse Rate: 85 (01/29 1230) Intake/Output from previous day:   Intake/Output from this shift:    Labs:  Recent Labs  04/09/14 1054  WBC 5.3  HGB 13.8  PLT 59*  CREATININE 1.27*   Estimated Creatinine Clearance: 26.8 mL/min (by C-G formula based on Cr of 1.27). No results for input(s): VANCOTROUGH, VANCOPEAK, VANCORANDOM, GENTTROUGH, GENTPEAK, GENTRANDOM, TOBRATROUGH, TOBRAPEAK, TOBRARND, AMIKACINPEAK, AMIKACINTROU, AMIKACIN in the last 72 hours.   Microbiology: Recent Results (from the past 720 hour(s))  Urine culture     Status: None   Collection Time: 03/11/14  4:20 PM  Result Value Ref Range Status   Specimen Description URINE, CATHETERIZED  Final   Special Requests NONE  Final   Colony Count NO GROWTH Performed at [facility]   Final   Culture NO GROWTH Performed at [facility]   Final   Report Status 03/14/2014 FINAL  Final    Medical History: Past Medical History  Diagnosis Date  . Depression   . HTN (hypertension)   . Chronic pain   . Fibromyalgia   . Migraines   . History of breast cancer     Right  . Chronic pancreatitis   . Vitamin D deficiency   . GERD (gastroesophageal reflux disease)   . Finger dislocation 2009    L 3rd  . COPD (chronic obstructive pulmonary disease)   . Anemia   . Fractured sternum 11/2008  . Osteoporosis    Reclast too expensive  . Barrett's esophagus   . Chronic fatigue   . Diverticulosis   . Opioid dependence   . 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 Abnormality of gait 10/12/2013  . Anxiety   . Depression   . Convulsions/seizures 10/12/2013    Possible benzodiazepine withdrawal seizure, x1-no further seizure activity   . Cancer     HX OF BREAST CANCER -chemo, radiation  . Type II or unspecified type diabetes mellitus without mention of complication, not stated as uncontrolled    Assessment: 69 yo F in ED with fever and fatigue.   Pharmacy consulted to dose vancomycin and cefepime for HCAP.  3 day history of weakness and cough. Fever 103.4 at home/ CXR with R sided PNA. Recent admit. Afebrile, WBC WNL, SCr 1.27, LA 3.27. First doses given in the ED  Vanc 1/29>> Cefepime 1/29 >>  Goal of Therapy:  Vancomycin trough level 15-20 mcg/ml  Plan:  1. Cefepime 1gm IV Q24H 2. Vanc 500mg  IV Q24H 3. [name] renal fxn, C&S, clinical status and trough at [facility], PharmD, BCPS Pager # [phone] 04/09/2014 1:00 PM

## 2014-04-10 ENCOUNTER — Inpatient Hospital Stay (HOSPITAL_COMMUNITY): Payer: Medicare Other

## 2014-04-10 LAB — BASIC METABOLIC PANEL
ANION GAP: 7 (ref 5–15)
BUN: 22 mg/dL (ref 6–23)
CHLORIDE: 112 mmol/L (ref 96–112)
CO2: 19 mmol/L (ref 19–32)
Calcium: 9.2 mg/dL (ref 8.4–10.5)
Creatinine, Ser: 1.14 mg/dL — ABNORMAL HIGH (ref 0.50–1.10)
GFR calc Af Amer: 57 mL/min — ABNORMAL LOW (ref >90)
GFR calc non Af Amer: 49 mL/min — ABNORMAL LOW (ref >90)
GLUCOSE: 147 mg/dL — AB (ref 70–99)
Potassium: 2.9 mmol/L — ABNORMAL LOW (ref 3.5–5.1)
Sodium: 138 mmol/L (ref 135–145)

## 2014-04-10 LAB — CBC
HCT: 29.5 % — ABNORMAL LOW (ref 36.0–46.0)
Hemoglobin: 10 g/dL — ABNORMAL LOW (ref 12.0–15.0)
MCH: 31.3 pg (ref 26.0–34.0)
MCHC: 33.9 g/dL (ref 30.0–36.0)
MCV: 92.2 fL (ref 78.0–100.0)
Platelets: 87 10*3/uL — ABNORMAL LOW (ref 150–400)
RBC: 3.2 MIL/uL — ABNORMAL LOW (ref 3.87–5.11)
RDW: 15.6 % — ABNORMAL HIGH (ref 11.5–15.5)
WBC: 8.5 10*3/uL (ref 4.0–10.5)

## 2014-04-10 LAB — GLUCOSE, CAPILLARY
GLUCOSE-CAPILLARY: 180 mg/dL — AB (ref 70–99)
GLUCOSE-CAPILLARY: 263 mg/dL — AB (ref 70–99)
Glucose-Capillary: 129 mg/dL — ABNORMAL HIGH (ref 70–99)
Glucose-Capillary: 151 mg/dL — ABNORMAL HIGH (ref 70–99)

## 2014-04-10 LAB — LACTIC ACID, PLASMA: LACTIC ACID, VENOUS: 1.4 mmol/L (ref 0.5–2.0)

## 2014-04-10 LAB — STREP PNEUMONIAE URINARY ANTIGEN: Strep Pneumo Urinary Antigen: NEGATIVE

## 2014-04-10 MED ORDER — POTASSIUM CHLORIDE CRYS ER 20 MEQ PO TBCR
40.0000 meq | EXTENDED_RELEASE_TABLET | ORAL | Status: AC
  Administered 2014-04-10 (×2): 40 meq via ORAL
  Filled 2014-04-10 (×2): qty 2

## 2014-04-10 MED ORDER — SODIUM CHLORIDE 0.9 % IV SOLN
INTRAVENOUS | Status: DC
  Administered 2014-04-10: 17:00:00 via INTRAVENOUS

## 2014-04-10 NOTE — Progress Notes (Signed)
PT Cancellation Note  Patient Details Name: Lindsey Gomez MRN: [id number] DOB: 1945-09-09   Cancelled Treatment:    Reason Eval/Treat Not Completed: Medical issues which prohibited therapy.  Patient with potassium at 2.9 today.  Patient also remains on bedrest per orders.  MD:  Please write activity orders when appropriate for patient.  PT will initiate PT evaluation at that time.  Thank you!   Despina Pole 04/10/2014, 4:59 PM Carita Pian. Sanjuana Kava, [place] Pager [phone]

## 2014-04-10 NOTE — Progress Notes (Addendum)
[facility] PROGRESS NOTE  Lindsey Gomez FKC:[id number] DOB: 02-Jul-1945 DOA: 04/09/2014 PCP: Mathews Argyle, MD  Assessment/Plan: Principal Problem:  HCAP (healthcare-associated pneumonia) Follow blood cx.  Check sputum for Gram stain and culture, urine for Legionella and strep antigen and HIV antibody. -Continue empiric vancomycin and cefepime. -Supportive care with antitussives, Tylenol and antiemetics. -Symptoms concerning for aspiration pneumonia. Seen by swallow eval and recommend dysphagia level II diet with nectar thick liquid. They will reassess patient.  Active Problems:  Acute renal failure Likely secondary to dehydration. Improving with IV fluids Avoid nephrotoxins.  Urinary retention Noted for about 500 mL urine retained in bladder scan overnight. Renal ultrasound ordered. Reportedly patient having urinary incontinence for past 24 hours  Hypokalemia Replenished with oral potassium   DM2 (diabetes mellitus, type 2) Uncontrolled with A1c greater than 8. Recent hospitalization for hyperosmolar nonketotic state. Continue home dose Lantus and sliding scale insulin   Thrombocytopenia Likely secondary to acute illness. Noted for drop in platelets during recent hospitalization as well. Avoid adding production monitor.   Anxiety state Continue home dose Ativan   Essential hypertension Blood pressure stable. 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 Stent was removed in October 2015.[name] Sees Dr Deatra Ina. On dicyclomine for ?abd cramping.     GERD (gastroesophageal reflux disease) and Barrett's esophagus Continue PPI    Abnormality of gait Follows with neurology as outpatient. Previous imaging showing age  disproportionate mild generalized cerebral atrophy. If incontinence persists , Check head repeat head CT to r/o NPH  given unsteady gait and urinary incontinence.  PT eval     Severe protein-calorie malnutrition Patient consult   Memory impairment Mild. Continue home medication   Chronic migraine On multiple medications which will be continued. Follows with Dr. Jannifer Franklin with neurology.   Hypokalemia Replenish  COPD and ongoing tobacco use Continue home inhalers. Add prn albuterol nebs. Continue nicotine patch.   Iron def anemia Continue iron sulfate  Patient is also on daily Solu Cortef 10 mg daily. Not clear why she is on it. Need to verify with her PCP   DVT prophylaxis: SCD  Diet: Dysphagia level II          Code Status: full code Family Communication: None at bedside Disposition Plan: home once improved   Consultants:  None  Procedures:  None  Antibiotics:  IV vancomycin and cefepime  HPI/Subjective: Patient had a temperature spike of 102.59F overnight. Complains of some headache this morning. Bladder scan done showing 500 mL residual urine.  Objective: Filed Vitals:   04/10/14 0704  BP:   Pulse:   Temp: 99.8 F (37.7 C)  Resp:     Intake/Output Summary (Last 24 hours) at 04/10/14 1228 Last data filed at 04/10/14 1200  Gross per 24 hour  Intake    358 ml  Output    600 ml  Net   -242 ml   Filed Weights   04/09/14 1006 04/09/14 1259  Weight: 39.009 kg (86 lb) 39.2 kg (86 lb 6.7 oz)    Exam:   General:  Elderly thin built female in no acute distress  HEENT: No pallor, moist oral mucosa, supple neck  Chest: Coarse breath sounds over right lung base, no rhonchi or wheeze  Gastrointestinal: Soft, nondistended, nontender  Musculoskeletal: Warm, no edema  CNS: Alert and oriented, flat affect    Data Reviewed: Basic Metabolic Panel:  Recent Labs  Lab 04/09/14 1054 04/10/14 0425  NA 136 138  K 3.1* 2.9*  CL 108 112  CO2 18* 19  GLUCOSE 154* 147*  BUN 27* 22  CREATININE 1.27* 1.14*  CALCIUM 9.4 9.2   Liver Function  Tests: No results for input(s): AST, ALT, ALKPHOS, BILITOT, PROT, ALBUMIN in the last 168 hours. No results for input(s): LIPASE, AMYLASE in the last 168 hours. No results for input(s): AMMONIA in the last 168 hours. CBC:  Recent Labs Lab 04/09/14 1054 04/10/14 0425  WBC 5.3 8.5  NEUTROABS 3.9  --   HGB 13.8 10.0*  HCT 40.8 29.5*  MCV 92.3 92.2  PLT 59* 87*   Cardiac Enzymes: No results for input(s): CKTOTAL, CKMB, CKMBINDEX, TROPONINI in the last 168 hours. BNP (last 3 results) No results for input(s): PROBNP in the last 8760 hours. CBG:  Recent Labs Lab 04/09/14 1104 04/09/14 1723 04/09/14 2114 04/10/14 0748  GLUCAP 132* 228* 145* 129*    No results found for this or any previous visit (from the past 240 hour(s)).   Studies: Dg Chest 2 View  04/09/2014   CLINICAL DATA:  Two day history of chest pain and fever with weakness.  EXAM: CHEST  2 VIEW  COMPARISON:  Chest CT 01/13/2014.  FINDINGS: The cardiac silhouette, mediastinal and hilar contours are normal and stable. Stable left basilar scarring changes and probable pleural thickening. Underlying emphysematous changes. There is a the right upper lobe infiltrate. The bony thorax is intact.  IMPRESSION: Right upper lobe pneumonia.  Chronic emphysematous changes and left basilar scarring.   Electronically Signed   By: Kalman Jewels M.D.   On: 04/09/2014 11:36   US Renal  04/10/2014   CLINICAL DATA:  Urinary retention.  Initial encounter.  EXAM: RENAL/URINARY TRACT ULTRASOUND COMPLETE  COMPARISON:  Renal ultrasound performed 03/12/2014, and CT of the abdomen and pelvis from 12/31/2013  FINDINGS: Right Kidney:  Length: 11.6 cm. Diffusely increased parenchymal echogenicity raises concern for medical renal disease. No hydronephrosis visualized. There is a 3 mm nonobstructing stone at the lower pole of the right kidney. A small 0.9 cm cyst is noted at the lower pole of the right kidney.  Left Kidney:  Length: 11.8 cm. Mildly  increased parenchymal echogenicity raises concern for medical renal disease. No hydronephrosis visualized. There is a 5 mm nonobstructing stone at the lower pole of the left kidney. A 1.6 cm cyst is noted at the interpole region of the left kidney.  Bladder:  Decompressed, with a Foley catheter in place.  IMPRESSION: 1. No evidence of hydronephrosis. 2. Diffusely increased renal parenchymal echogenicity raises concern for medical renal disease. 3. Nonobstructing bilateral renal stones again noted. 4. Small bilateral renal cysts seen.   Electronically Signed   By: Garald Balding M.D.   On: 04/10/2014 12:21    Scheduled Meds: . albuterol  2.5 mg Nebulization TID  . amLODipine  10 mg Oral Daily  . atenolol  100 mg Oral Daily  . atorvastatin  10 mg Oral q1800  . ceFEPime (MAXIPIME) IV  1 g Intravenous Q24H  . dicyclomine  10 mg Oral TID AC  . ferrous sulfate  325 mg Oral Q breakfast  . fluticasone  1 spray Each Nare Daily  . guaiFENesin  600 mg Oral BID  . hydrocortisone  10 mg Oral Daily  . insulin aspart  0-15 Units Subcutaneous TID WC  . insulin aspart  0-5 Units Subcutaneous QHS  . insulin detemir  8 Units Subcutaneous QHS  .  LORazepam  1 mg Oral BID  . memantine  28 mg Oral Daily  . nicotine  7 mg Transdermal Daily  . ondansetron  8 mg Oral BID  . pantoprazole  40 mg Oral Daily  . potassium chloride  40 mEq Oral Q4H  . pregabalin  25 mg Oral Daily  . saccharomyces boulardii  250 mg Oral BID  . topiramate  100 mg Oral BID  . vancomycin  500 mg Intravenous Q24H  . Vortioxetine HBr  20 mg Oral Daily   Continuous Infusions:     Time spent: 25 minutes    Monterio Bob, [place]  [facility] Pager [phone]. If 7PM-7AM, please contact night-coverage at [email or web address], password [facility] 04/10/2014, 12:28 PM  LOS: 1 day

## 2014-04-10 NOTE — Progress Notes (Signed)
SLP Cancellation Note  Patient Details Name: Lindsey Gomez MRN: [id number] DOB: November 21, 1945   Cancelled treatment:       Reason Eval/Treat Not Completed: Patient at procedure or test/unavailable. At ultrasound. Will revisit if time allows.   Kern Reap, MA, CCC-SLP 04/10/2014, 10:22 AM

## 2014-04-10 NOTE — Progress Notes (Addendum)
Speech Language Pathology Treatment: Dysphagia  Patient Details Name: Lindsey Gomez MRN: [id number] DOB: 26-Apr-1945 Today's Date: 04/10/2014 Time: 6144-3154 SLP Time Calculation (min) (ACUTE ONLY): 20 min  Assessment / Plan / Recommendation Clinical Impression  Dysphagia treatment provided today for diet tolerance check. Pt demonstrated delayed cough following 1 sip of nectar thick liquid; this appeared to be a larger sip, so began to cue pt to take small bites and sips. No difficulties with dysphagia 2 diet consistency. Pt appears to be tolerating diet well but is at risk of aspiration without full supervision to cue for small sips. Recommend continuing dysphagia 2 diet/ nectar thick liquids, meds whole in puree. Will continue to follow for diet tolerance/ advancement.   HPI HPI: 69 year old female with uncontrolled type 2 diabetes mellitus, chronic pancreatitis status post partial pancreatectomy, chronic migraine headaches, GERD, fibromyalgia, history of right-sided breast cancer, essential hypertension, COPD with ongoing tobacco use and mild memory impairment who was recently hospitalized for nonketotic hyper osmolar state and acute kidney injury was brought to the ED for fever of 103.21F and CXR concerning for RUL PNA.   Pertinent Vitals Pain Assessment: No/denies pain  SLP Plan  Continue with current plan of care    Recommendations Diet recommendations: Dysphagia 2 (fine chop);Nectar-thick liquid Liquids provided via: Cup;No straw Medication Administration: Whole meds with puree Supervision: Patient able to self feed;Full supervision/cueing for compensatory strategies Compensations: Slow rate;Small sips/bites Postural Changes and/or Swallow Maneuvers: Seated upright 90 degrees;Upright 30-60 min after meal              Oral Care Recommendations: Oral care BID Plan: Continue with current plan of care    GO     Kern Reap, MA, CCC-SLP 04/10/2014, 2:58 PM

## 2014-04-11 ENCOUNTER — Inpatient Hospital Stay (HOSPITAL_COMMUNITY): Payer: Medicare Other

## 2014-04-11 DIAGNOSIS — R339 Retention of urine, unspecified: Secondary | ICD-10-CM

## 2014-04-11 DIAGNOSIS — E119 Type 2 diabetes mellitus without complications: Secondary | ICD-10-CM

## 2014-04-11 DIAGNOSIS — E1165 Type 2 diabetes mellitus with hyperglycemia: Secondary | ICD-10-CM

## 2014-04-11 LAB — CBC
HCT: 27.3 % — ABNORMAL LOW (ref 36.0–46.0)
Hemoglobin: 9.2 g/dL — ABNORMAL LOW (ref 12.0–15.0)
MCH: 30.6 pg (ref 26.0–34.0)
MCHC: 33.7 g/dL (ref 30.0–36.0)
MCV: 90.7 fL (ref 78.0–100.0)
PLATELETS: 81 10*3/uL — AB (ref 150–400)
RBC: 3.01 MIL/uL — ABNORMAL LOW (ref 3.87–5.11)
RDW: 15.8 % — AB (ref 11.5–15.5)
WBC: 7.2 10*3/uL (ref 4.0–10.5)

## 2014-04-11 LAB — BASIC METABOLIC PANEL WITH GFR
Anion gap: 6 (ref 5–15)
BUN: 20 mg/dL (ref 6–23)
CO2: 18 mmol/L — ABNORMAL LOW (ref 19–32)
Calcium: 8.8 mg/dL (ref 8.4–10.5)
Chloride: 113 mmol/L — ABNORMAL HIGH (ref 96–112)
Creatinine, Ser: 1.04 mg/dL (ref 0.50–1.10)
GFR calc Af Amer: 63 mL/min — ABNORMAL LOW (ref >90)
GFR calc non Af Amer: 55 mL/min — ABNORMAL LOW (ref >90)
Glucose, Bld: 203 mg/dL — ABNORMAL HIGH (ref 70–99)
Potassium: 3.4 mmol/L — ABNORMAL LOW (ref 3.5–5.1)
Sodium: 137 mmol/L (ref 135–145)

## 2014-04-11 LAB — GLUCOSE, CAPILLARY
GLUCOSE-CAPILLARY: 100 mg/dL — AB (ref 70–99)
Glucose-Capillary: 178 mg/dL — ABNORMAL HIGH (ref 70–99)
Glucose-Capillary: 128 mg/dL — ABNORMAL HIGH (ref 70–99)
Glucose-Capillary: 128 mg/dL — ABNORMAL HIGH (ref 70–99)

## 2014-04-11 MED ORDER — SERTRALINE HCL 25 MG PO TABS
25.0000 mg | ORAL_TABLET | Freq: Every day | ORAL | Status: DC
  Administered 2014-04-11 – 2014-04-15 (×5): 25 mg via ORAL
  Filled 2014-04-11 (×6): qty 1

## 2014-04-11 MED ORDER — INSULIN DETEMIR 100 UNIT/ML ~~LOC~~ SOLN
12.0000 [IU] | Freq: Every day | SUBCUTANEOUS | Status: DC
  Administered 2014-04-11: 12 [IU] via SUBCUTANEOUS
  Filled 2014-04-11 (×2): qty 0.12

## 2014-04-11 MED ORDER — POTASSIUM CHLORIDE CRYS ER 20 MEQ PO TBCR
40.0000 meq | EXTENDED_RELEASE_TABLET | Freq: Once | ORAL | Status: AC
  Administered 2014-04-11: 40 meq via ORAL
  Filled 2014-04-11: qty 2

## 2014-04-11 NOTE — Evaluation (Signed)
Physical Therapy Evaluation Patient Details Name: Lindsey Gomez MRN: [id number] DOB: 1945-11-25 Today's Date: 04/11/2014   History of Present Illness  Admitted with Healthcare Associated Pneumonia; recent admission 4 wks ago with dehydration, UTI and acute kidney injury  Past Medical History  Diagnosis Date  . Depression   . HTN (hypertension)   . Chronic pain   . Fibromyalgia   . Migraines   . History of breast cancer     Right  . Chronic pancreatitis   . Vitamin D deficiency   . GERD (gastroesophageal reflux disease)   . Finger dislocation 2009    L 3rd  . COPD (chronic obstructive pulmonary disease)   . Anemia   . Fractured sternum 11/2008  . Osteoporosis     Reclast too expensive  . Barrett's esophagus   . Chronic fatigue   . Diverticulosis   . Opioid dependence   . 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 Abnormality of gait 10/12/2013  . Anxiety   . Depression   . Convulsions/seizures 10/12/2013    Possible benzodiazepine withdrawal seizure, x1-no further seizure activity   . Cancer     HX OF BREAST CANCER -chemo, radiation  . Type II or unspecified type diabetes mellitus without mention of complication, not stated as uncontrolled    Past Surgical History  Procedure Laterality Date  . Nasal sinus surgery    . Pancreatectomy      40%  . Cholecystectomy    . Appendectomy    . Abdominal hysterectomy    . Lobectomy Left 2005    granulomatous lesion.   . Breast lumpectomy Left   . Ercp N/A 10/05/2013    Procedure: ENDOSCOPIC RETROGRADE CHOLANGIOPANCREATOGRAPHY (ERCP);  Surgeon: Ladene Artist, MD;  Location: [facility] ENDOSCOPY;  Service: Endoscopy;  Laterality: N/A;  . Cataract extraction Bilateral   . Botox injection      recent botox injection 12-23-13 injection for Migraines  . Cataract extraction, bilateral Bilateral     bilateral  . Eye surgery      1 eye "membrane surgery"  . Ercp N/A 01/07/2014    Procedure: ENDOSCOPIC RETROGRADE CHOLANGIOPANCREATOGRAPHY (ERCP);  Surgeon:  Inda Castle, MD;  Location: [facility] ENDOSCOPY;  Service: Endoscopy;  Laterality: N/A;  . Spyglass cholangioscopy N/A 01/07/2014    Procedure: OYDXAJOI CHOLANGIOSCOPY;  Surgeon: Inda Castle, MD;  Location: [facility] ENDOSCOPY;  Service: Endoscopy;  Laterality: N/A;     Clinical Impression  Pt admitted with above diagnosis. Pt currently with functional limitations due to the deficits listed below (see PT Problem List).  Pt will benefit from skilled PT to increase their independence and safety with mobility to allow discharge to the venue listed below.       Follow Up Recommendations Supervision/Assistance - 24 hour (post-acute rehab)    Equipment Recommendations  Rolling walker with 5" wheels;3in1 (PT) (youth-sized RW)    Recommendations for Other Services OT consult     Precautions / Restrictions Precautions Precautions: Fall      Mobility  Bed Mobility Overal bed mobility: Needs Assistance Bed Mobility: Supine to Sit     Supine to sit: Min assist     General bed mobility comments: Min assist using bed pad to more efficiently square off hips at EOB  Transfers Overall transfer level: Needs assistance Equipment used: 1 person hand held assist Transfers: Sit to/from Stand Sit to Stand: Mod assist         General transfer comment: Heavy mod assist as pt is tending to  lean posteriorly  Ambulation/Gait Ambulation/Gait assistance: Mod assist Ambulation Distance (Feet): 5 Feet Assistive device: 1 person hand held assist Gait Pattern/deviations: Shuffle;Ataxic;Decreased step length - right;Decreased step length - left;Decreased stance time - right;Decreased stance time - left     General Gait Details: Leans posteriorly requiring heavy mod assist to prevent fall; small, ataxic, almost choreo-like steps  Stairs            Wheelchair Mobility    Modified Rankin (Stroke Patients Only)       Balance Overall balance assessment: Needs assistance Sitting-balance  support: Single extremity supported Sitting balance-Leahy Scale: Fair       Standing balance-Leahy Scale: Zero Standing balance comment: Heavy posterior lean                             Pertinent Vitals/Pain Pain Assessment: Faces Faces Pain Scale: Hurts little more Pain Location: Headache Pain Descriptors / Indicators: Aching;Discomfort Pain Intervention(s): Limited activity within patient's tolerance;Monitored during session;Repositioned    Home Living Family/patient expects to be discharged to:: Private residence Living Arrangements: Spouse/significant other Available Help at Discharge: Family;Personal care attendant;Available PRN/intermittently Type of Home: House Home Access: Level entry     Home Layout: One level Home Equipment: Walker - 2 wheels;Bedside commode;Shower seat;Grab bars - tub/shower;Hand held shower head;Cane - single point      Prior Function Level of Independence: Needs assistance   Gait / Transfers Assistance Needed: Pt ambulating (I) PTA however history of falls.   ADL's / Homemaking Assistance Needed: Pt states that aide was assisting with washing her back and supervision for bathing, and that she occasionally needed assist to dress.        Hand Dominance   Dominant Hand: Right    Extremity/Trunk Assessment   Upper Extremity Assessment: Generalized weakness           Lower Extremity Assessment: Generalized weakness         Communication   Communication: No difficulties  Cognition Arousal/Alertness: Awake/alert Behavior During Therapy: Flat affect (Max encouragement to get OOB)                        General Comments General comments (skin integrity, edema, etc.): Supervised pt while drinking water thickened to nectar; noted cough, RN notified; session conducted on Room Air; O2 sats 92-94% in chair at end of session    Exercises        Assessment/Plan    PT Assessment Patient needs continued PT  services  PT Diagnosis Difficulty walking;Abnormality of gait;Generalized weakness   PT Problem List Decreased strength;Decreased range of motion;Decreased activity tolerance;Decreased balance;Decreased mobility;Decreased coordination;Decreased cognition;Decreased knowledge of use of DME;Decreased safety awareness;Decreased knowledge of precautions;Cardiopulmonary status limiting activity  PT Treatment Interventions DME instruction;Gait training;Stair training;Functional mobility training;Therapeutic activities;Therapeutic exercise;Patient/family education;Cognitive remediation;Balance training   PT Goals (Current goals can be found in the Care Plan section) Acute Rehab PT Goals Patient Stated Goal: wanted back to bed soon after getting up PT Goal Formulation: Patient unable to participate in goal setting Time For Goal Achievement: 04/25/14 Potential to Achieve Goals: Good    Frequency Min 3X/week   Barriers to discharge   Would need to verify that she has 24 hour assist    Co-evaluation               End of Session Equipment Utilized During Treatment: Gait belt Activity Tolerance: Patient tolerated treatment well Patient left: in  chair;with call bell/phone within reach Nurse Communication: Mobility status         Time: 2072-1828 PT Time Calculation (min) (ACUTE ONLY): 13 min   Charges:   PT Evaluation $Initial PT Evaluation Tier I: 1 Procedure     PT G Codes:        Quin Hoop 04/11/2014, 3:46 PM  Roney Marion, [place] Pager [phone] Office [phone]

## 2014-04-11 NOTE — Progress Notes (Signed)
[facility] PROGRESS NOTE  Lindsey Gomez HKV:[id number] DOB: April 25, 1945 DOA: 04/09/2014 PCP: Mathews Argyle, MD  Brief narrative 68 year old female with uncontrolled type 2 diabetes mellitus, chronic pancreatitis status post partial pancreatectomy, chronic migraine headaches, GERD, fibromyalgia history of right-sided breast cancer, essential hypertension, COPD (quit smoking 2 months back) and mild memory impairment who was recently hospitalized for nonketotic hyperosmolar state and acute kidney injury the was brought to the ED with fever of 103.51F at home associated with productive cough with greenish sputum for the past 2-3 days and some dyspnea on exertion and generalized fatigue. Patient admitted with healthcare associated pneumonia.  Assessment/Plan: Principal Problem:  HCAP (healthcare-associated pneumonia) Culture so far negative.  Check sputum for Gram stain and culture. Urine strep antigen negative. urine for Legionella pending . HIV antibody pending -Continue empiric vancomycin and cefepime. -Supportive care with antitussives, Tylenol and antiemetics. -Symptoms concerning for aspiration pneumonia. Seen by swallow eval and recommend dysphagia level II diet with nectar thick liquid.    Active Problems:  Acute renal failure Likely secondary to dehydration. Resolved with IV fluids. Avoid nephrotoxins.  Urinary retention Noted for about 500 mL urine retained in bladder scan overnight. Renal ultrasound shows possible medical renal disease. Patient placed on Foley catheter on 1/30. Will plan on voiding trial tomorrow morning.  Hypokalemia Replenished with oral potassium   DM2 (diabetes mellitus, type 2) Uncontrolled with A1c greater than 8. Recent hospitalization for hyperosmolar nonketotic state. Continue home dose Lantus and sliding scale insulin   Thrombocytopenia Likely secondary to acute illness. Slowly improving   Anxiety state Continue home dose  Ativan   Essential hypertension Blood pressure stable. 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 Stent was removed in October 2015.[name] Sees Dr Deatra Ina. On dicyclomine for ?abd cramping.     GERD (gastroesophageal reflux disease) and Barrett's esophagus Continue PPI  Chronic migraine On multiple medications which have been resumed. Follows with outpatient neurology   Abnormality of gait Follows with neurology as outpatient. Previous imaging showing age  disproportionate mild generalized cerebral atrophy. If incontinence persists , Check head  CT to r/o NPH given unsteady gait and urinary incontinence.  PT eval     Severe protein-calorie malnutrition Nutrition consult   Memory impairment Mild. Continue home medication   Chronic migraine On multiple medications which will be continued. Follows with Dr. Jannifer Franklin with neurology.   Hypokalemia Replenish  COPD and ongoing tobacco use Continue home inhalers. Add prn albuterol nebs. Continue nicotine patch.   Iron def anemia Continue iron sulfate  Patient is also on daily Solu Cortef 10 mg daily. Not clear why she is on it. Need to verify with her PCP   DVT prophylaxis: SCD  Diet: Dysphagia level II          Code Status: full code Family Communication: None at bedside Disposition Plan: home once improved   Consultants:  None  Procedures:  None  Antibiotics:  IV vancomycin and cefepime  HPI/Subjective: Complains of headaches. Some temperature of 100.51F . Still has productive cough. Denies shortness of breath  Objective: Filed Vitals:   04/11/14 0525  BP: 112/71  Pulse: 92  Temp: 98.4 F (36.9 C)  Resp: 15    Intake/Output Summary (Last 24 hours) at 04/11/14 1028 Last data filed at 04/11/14 0527  Gross per 24 hour  Intake    840 ml  Output   2150 ml  Net  -1310 ml   Filed Weights   04/09/14 1006 04/09/14 1259  Weight: 39.009 kg (86 lb) 39.2 kg (86 lb 6.7 oz)    Exam:   General:  Elderly thin built female in no acute distress  HEENT: No pallor, moist oral mucosa, supple neck  Chest: Coarse Crackles over right lung, no rhonchi or wheeze  Gastrointestinal: Soft, nondistended, nontender  Musculoskeletal: Warm, no edema, Foley in place with clear urine.  CNS: Alert and oriented, flat affect    Data Reviewed: Basic Metabolic Panel:  Recent Labs Lab 04/09/14 1054 04/10/14 0425 04/11/14 0525  NA 136 138 137  K 3.1* 2.9* 3.4*  CL 108 112 113*  CO2 18* 19 18*  GLUCOSE 154* 147* 203*  BUN 27* 22 20  CREATININE 1.27* 1.14* 1.04  CALCIUM 9.4 9.2 8.8   Liver Function Tests: No results for input(s): AST, ALT, ALKPHOS, BILITOT, PROT, ALBUMIN in the last 168 hours. No results for input(s): LIPASE, AMYLASE in the last 168 hours. No results for input(s): AMMONIA in the last 168 hours. CBC:  Recent Labs Lab 04/09/14 1054 04/10/14 0425 04/11/14 0525  WBC 5.3 8.5 7.2  NEUTROABS 3.9  --   --   HGB 13.8 10.0* 9.2*  HCT 40.8 29.5* 27.3*  MCV 92.3 92.2 90.7  PLT 59* 87* 81*   Cardiac Enzymes: No results for input(s): CKTOTAL, CKMB, CKMBINDEX, TROPONINI in the last 168 hours. BNP (last 3 results) No results for input(s): PROBNP in the last 8760 hours. CBG:  Recent Labs Lab 04/10/14 0748 04/10/14 1227 04/10/14 1707 04/10/14 2206 04/11/14 0754  GLUCAP 129* 151* 180* 263* 178*    Recent Results (from the past 240 hour(s))  Blood culture (routine x 2)     Status: None (Preliminary result)   Collection Time: 04/09/14 10:55 AM  Result Value Ref Range Status   Specimen Description BLOOD LEFT ARM  Final   Special Requests BOTTLES DRAWN AEROBIC AND ANAEROBIC 5CC  Final   Culture   Final           BLOOD CULTURE RECEIVED NO GROWTH TO DATE CULTURE WILL BE HELD FOR 5 DAYS BEFORE ISSUING A FINAL NEGATIVE  REPORT Performed at [facility]    Report Status PENDING  Incomplete  Blood culture (routine x 2)     Status: None (Preliminary result)   Collection Time: 04/09/14 11:00 AM  Result Value Ref Range Status   Specimen Description BLOOD RIGHT ANTECUBITAL  Final   Special Requests BOTTLES DRAWN AEROBIC AND ANAEROBIC 5CC  Final   Culture   Final           BLOOD CULTURE RECEIVED NO GROWTH TO DATE CULTURE WILL BE HELD FOR 5 DAYS BEFORE ISSUING A FINAL NEGATIVE REPORT Performed at [facility]    Report Status PENDING  Incomplete     Studies: Dg Chest 2 View  04/09/2014   CLINICAL DATA:  Two day history of chest pain and fever with weakness.  EXAM: CHEST  2 VIEW  COMPARISON:  Chest CT 01/13/2014.  FINDINGS: The cardiac silhouette, mediastinal and hilar contours are normal and stable. Stable left basilar scarring changes and probable pleural thickening. Underlying emphysematous changes. There is a the right upper lobe infiltrate. The bony thorax is intact.  IMPRESSION: Right upper lobe pneumonia.  Chronic emphysematous changes and left basilar scarring.   Electronically Signed   By: Kalman Jewels M.D.   On: 04/09/2014 11:36   US Renal  04/10/2014  CLINICAL DATA:  Urinary retention.  Initial encounter.  EXAM: RENAL/URINARY TRACT ULTRASOUND COMPLETE  COMPARISON:  Renal ultrasound performed 03/12/2014, and CT of the abdomen and pelvis from 12/31/2013  FINDINGS: Right Kidney:  Length: 11.6 cm. Diffusely increased parenchymal echogenicity raises concern for medical renal disease. No hydronephrosis visualized. There is a 3 mm nonobstructing stone at the lower pole of the right kidney. A small 0.9 cm cyst is noted at the lower pole of the right kidney.  Left Kidney:  Length: 11.8 cm. Mildly increased parenchymal echogenicity raises concern for medical renal disease. No hydronephrosis visualized. There is a 5 mm nonobstructing stone at the lower pole of the left kidney. A 1.6 cm cyst is noted  at the interpole region of the left kidney.  Bladder:  Decompressed, with a Foley catheter in place.  IMPRESSION: 1. No evidence of hydronephrosis. 2. Diffusely increased renal parenchymal echogenicity raises concern for medical renal disease. 3. Nonobstructing bilateral renal stones again noted. 4. Small bilateral renal cysts seen.   Electronically Signed   By: Garald Balding M.D.   On: 04/10/2014 12:21    Scheduled Meds: . albuterol  2.5 mg Nebulization TID  . amLODipine  10 mg Oral Daily  . atenolol  100 mg Oral Daily  . atorvastatin  10 mg Oral q1800  . ceFEPime (MAXIPIME) IV  1 g Intravenous Q24H  . dicyclomine  10 mg Oral TID AC  . ferrous sulfate  325 mg Oral Q breakfast  . fluticasone  1 spray Each Nare Daily  . guaiFENesin  600 mg Oral BID  . hydrocortisone  10 mg Oral Daily  . insulin aspart  0-15 Units Subcutaneous TID WC  . insulin aspart  0-5 Units Subcutaneous QHS  . insulin detemir  12 Units Subcutaneous QHS  . LORazepam  1 mg Oral BID  . memantine  28 mg Oral Daily  . nicotine  7 mg Transdermal Daily  . ondansetron  8 mg Oral BID  . pantoprazole  40 mg Oral Daily  . pregabalin  25 mg Oral Daily  . saccharomyces boulardii  250 mg Oral BID  . sertraline  25 mg Oral Daily  . topiramate  100 mg Oral BID  . vancomycin  500 mg Intravenous Q24H  . Vortioxetine HBr  20 mg Oral Daily   Continuous Infusions: . sodium chloride 75 mL/hr at 04/10/14 1705      Time spent: 25 minutes    Hadiyah Maricle, [place]  [facility] Pager [phone]. If 7PM-7AM, please contact night-coverage at [email or web address], password [facility] 04/11/2014, 10:28 AM  LOS: 2 days

## 2014-04-12 DIAGNOSIS — J189 Pneumonia, unspecified organism: Secondary | ICD-10-CM

## 2014-04-12 DIAGNOSIS — R5381 Other malaise: Secondary | ICD-10-CM

## 2014-04-12 DIAGNOSIS — E162 Hypoglycemia, unspecified: Secondary | ICD-10-CM

## 2014-04-12 DIAGNOSIS — R269 Unspecified abnormalities of gait and mobility: Secondary | ICD-10-CM

## 2014-04-12 LAB — BASIC METABOLIC PANEL
ANION GAP: 8 (ref 5–15)
BUN: 16 mg/dL (ref 6–23)
CALCIUM: 8.6 mg/dL (ref 8.4–10.5)
CO2: 15 mmol/L — ABNORMAL LOW (ref 19–32)
CREATININE: 0.93 mg/dL (ref 0.50–1.10)
Chloride: 113 mmol/L — ABNORMAL HIGH (ref 96–112)
GFR calc non Af Amer: 63 mL/min — ABNORMAL LOW (ref >90)
GFR, EST AFRICAN AMERICAN: 73 mL/min — AB (ref >90)
Glucose, Bld: 46 mg/dL — ABNORMAL LOW (ref 70–99)
POTASSIUM: 2.8 mmol/L — AB (ref 3.5–5.1)
Sodium: 136 mmol/L (ref 135–145)

## 2014-04-12 LAB — LEGIONELLA ANTIGEN, URINE

## 2014-04-12 LAB — CLOSTRIDIUM DIFFICILE BY PCR: CDIFFPCR: NEGATIVE

## 2014-04-12 LAB — CBC
HCT: 28.1 % — ABNORMAL LOW (ref 36.0–46.0)
Hemoglobin: 9.7 g/dL — ABNORMAL LOW (ref 12.0–15.0)
MCH: 30.9 pg (ref 26.0–34.0)
MCHC: 34.5 g/dL (ref 30.0–36.0)
MCV: 89.5 fL (ref 78.0–100.0)
PLATELETS: 116 10*3/uL — AB (ref 150–400)
RBC: 3.14 MIL/uL — AB (ref 3.87–5.11)
RDW: 15.4 % (ref 11.5–15.5)
WBC: 8.1 10*3/uL (ref 4.0–10.5)

## 2014-04-12 LAB — GLUCOSE, CAPILLARY
GLUCOSE-CAPILLARY: 40 mg/dL — AB (ref 70–99)
GLUCOSE-CAPILLARY: 57 mg/dL — AB (ref 70–99)
GLUCOSE-CAPILLARY: 120 mg/dL — AB (ref 70–99)
GLUCOSE-CAPILLARY: 176 mg/dL — AB (ref 70–99)
Glucose-Capillary: 44 mg/dL — CL (ref 70–99)
Glucose-Capillary: 79 mg/dL (ref 70–99)
Glucose-Capillary: 265 mg/dL — ABNORMAL HIGH (ref 70–99)

## 2014-04-12 LAB — HIV ANTIBODY (ROUTINE TESTING W REFLEX): HIV SCREEN 4TH GENERATION: NONREACTIVE

## 2014-04-12 MED ORDER — TOPIRAMATE 25 MG PO TABS
25.0000 mg | ORAL_TABLET | Freq: Two times a day (BID) | ORAL | Status: DC
  Administered 2014-04-12 – 2014-04-15 (×6): 25 mg via ORAL
  Filled 2014-04-12 (×8): qty 1

## 2014-04-12 MED ORDER — SODIUM BICARBONATE 650 MG PO TABS
650.0000 mg | ORAL_TABLET | Freq: Two times a day (BID) | ORAL | Status: DC
  Administered 2014-04-12 – 2014-04-15 (×7): 650 mg via ORAL
  Filled 2014-04-12 (×8): qty 1

## 2014-04-12 MED ORDER — INSULIN DETEMIR 100 UNIT/ML ~~LOC~~ SOLN
8.0000 [IU] | Freq: Every day | SUBCUTANEOUS | Status: DC
  Administered 2014-04-12 – 2014-04-14 (×3): 8 [IU] via SUBCUTANEOUS
  Filled 2014-04-12 (×4): qty 0.08

## 2014-04-12 MED ORDER — DEXTROSE-NACL 5-0.9 % IV SOLN
INTRAVENOUS | Status: DC
  Administered 2014-04-12: 10:00:00 via INTRAVENOUS

## 2014-04-12 MED ORDER — MEMANTINE HCL ER 7 MG PO CP24
7.0000 mg | ORAL_CAPSULE | Freq: Two times a day (BID) | ORAL | Status: DC
  Administered 2014-04-13 – 2014-04-15 (×5): 7 mg via ORAL
  Filled 2014-04-12 (×7): qty 1

## 2014-04-12 MED ORDER — METRONIDAZOLE IN NACL 5-0.79 MG/ML-% IV SOLN: 500.0000 mg | Freq: Three times a day (TID) | INTRAVENOUS | Status: DC

## 2014-04-12 MED ORDER — POTASSIUM CHLORIDE CRYS ER 20 MEQ PO TBCR
40.0000 meq | EXTENDED_RELEASE_TABLET | Freq: Once | ORAL | Status: AC
  Administered 2014-04-12: 40 meq via ORAL
  Filled 2014-04-12: qty 2

## 2014-04-12 MED ORDER — SODIUM CHLORIDE 0.9 % IV SOLN
INTRAVENOUS | Status: DC
  Administered 2014-04-12 – 2014-04-13 (×3): via INTRAVENOUS
  Filled 2014-04-12 (×8): qty 1000

## 2014-04-12 MED ORDER — PIPERACILLIN-TAZOBACTAM 3.375 G IVPB
3.3750 g | Freq: Three times a day (TID) | INTRAVENOUS | Status: DC
  Administered 2014-04-12 – 2014-04-13 (×4): 3.375 g via INTRAVENOUS
  Filled 2014-04-12 (×6): qty 50

## 2014-04-12 MED ORDER — PREGABALIN 25 MG PO CAPS
25.0000 mg | ORAL_CAPSULE | Freq: Two times a day (BID) | ORAL | Status: DC
  Administered 2014-04-12 – 2014-04-15 (×6): 25 mg via ORAL
  Filled 2014-04-12 (×6): qty 1

## 2014-04-12 NOTE — Consult Note (Signed)
Physical Medicine and Rehabilitation Consult  Reason for Consult: Deconditioning Referring Physician: Dr. Clementeen Graham   HPI: Lindsey Gomez is a 69 y.o. female with uncontrolled type 2 diabetes mellitus, chronic pancreatitis status post partial pancreatectomy, chronic migraine headaches, GERD, fibromyalgia, history of right-sided breast cancer, essential hypertension, COPD with ongoing tobacco use and mild memory impairment who was recently hospitalized for nonketotic hyper osmolar state and acute kidney injury was brought to the ED for fever of 103.48F on 04/09/14 with productive cough of green sputum for the past 2-3 days with some dyspnea on exertion, generalized fatigue, pleuritic chest pain as well as nausea. She was found to have HCAP as well as acute renal failure due to dehydration. She was started on IVF as well as IV antibiotics for treatment and swallow evaluation done revealing frequent cough as well as difficulty with mastication therefore patient placed on dysphagia 2, nectar liquids. PT evaluation done yesterday revealing balance deficits with ataxic gait. CIR recommended by MD for follow up therapy.    ROS    Past Medical History  Diagnosis Date  . Depression   . HTN (hypertension)   . Chronic pain   . Fibromyalgia   . Migraines   . History of breast cancer     Right  . Chronic pancreatitis   . Vitamin D deficiency   . GERD (gastroesophageal reflux disease)   . Finger dislocation 2009    L 3rd  . COPD (chronic obstructive pulmonary disease)   . Anemia   . Fractured sternum 11/2008  . Osteoporosis     Reclast too expensive  . Barrett's esophagus   . Chronic fatigue   . Diverticulosis   . Opioid dependence   . 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 Abnormality of gait 10/12/2013  . Anxiety   . Depression   . Convulsions/seizures 10/12/2013    Possible benzodiazepine withdrawal seizure, x1-no further seizure activity   . Cancer     HX OF BREAST CANCER -chemo, radiation  .  Type II or unspecified type diabetes mellitus without mention of complication, not stated as uncontrolled     Past Surgical History  Procedure Laterality Date  . Nasal sinus surgery    . Pancreatectomy      40%  . Cholecystectomy    . Appendectomy    . Abdominal hysterectomy    . Lobectomy Left 2005    granulomatous lesion.   . Breast lumpectomy Left   . Ercp N/A 10/05/2013    Procedure: ENDOSCOPIC RETROGRADE CHOLANGIOPANCREATOGRAPHY (ERCP);  Surgeon: Ladene Artist, MD;  Location: [facility] ENDOSCOPY;  Service: Endoscopy;  Laterality: N/A;  . Cataract extraction Bilateral   . Botox injection      recent botox injection 12-23-13 injection for Migraines  . Cataract extraction, bilateral Bilateral     bilateral  . Eye surgery      1 eye "membrane surgery"  . Ercp N/A 01/07/2014    Procedure: ENDOSCOPIC RETROGRADE CHOLANGIOPANCREATOGRAPHY (ERCP);  Surgeon: Inda Castle, MD;  Location: [facility] ENDOSCOPY;  Service: Endoscopy;  Laterality: N/A;  . Spyglass cholangioscopy N/A 01/07/2014    Procedure: EXHBZJIR CHOLANGIOSCOPY;  Surgeon: Inda Castle, MD;  Location: [facility] ENDOSCOPY;  Service: Endoscopy;  Laterality: N/A;    Family History  Problem Relation Age of Onset  . Heart attack Brother   . COPD Father   . Heart disease Father   . Leukemia Mother   . Diabetes Maternal Uncle   . Dementia Sister   .  Alzheimer's disease Sister   . Heart attack Brother   . Colon cancer Neg Hx   . Colon polyps Neg Hx     Social History:  reports that she quit smoking about 2 months ago. Her smoking use included Cigarettes. She has a 40 pack-year smoking history. She has never used smokeless tobacco. She reports that she does not drink alcohol or use illicit drugs.    Allergies  Allergen Reactions  . Milnacipran Swelling and Other (See Comments)    Headache and hand swelling   . Rizatriptan Benzoate Nausea And Vomiting  . Gabapentin Other (See Comments)    Weak muscles  . Moxifloxacin Other (See  Comments)    Unknown  . Sulfonamide Derivatives Rash  . Venlafaxine Other (See Comments)    Unknown    Medications Prior to Admission  Medication Sig Dispense Refill  . amLODipine (NORVASC) 10 MG tablet Take 1 tablet (10 mg total) by mouth daily. 30 tablet 1  . amLODipine (NORVASC) 5 MG tablet   10  . atenolol (TENORMIN) 100 MG tablet Take 1 tablet (100 mg total) by mouth every morning. 30 tablet 11  . atorvastatin (LIPITOR) 10 MG tablet Take 1 tablet (10 mg total) by mouth daily. 30 tablet 11  . dicyclomine (BENTYL) 10 MG capsule TAKE 1 CAPSULE (10 MG TOTAL) BY MOUTH 3 (THREE) TIMES DAILY BEFORE MEALS. 90 capsule 1  . docusate sodium (COLACE) 100 MG capsule Take 100 mg by mouth daily as needed for mild constipation.    . ferrous sulfate 325 (65 FE) MG tablet Take 325 mg by mouth daily with breakfast.    . glucagon (GLUCAGON EMERGENCY) 1 MG injection Inject 1 mg into the vein once as needed (for glucose).    . hydrocortisone (CORTEF) 10 MG tablet Take 10 mg by mouth daily.    . insulin aspart (NOVOLOG) 100 UNIT/ML FlexPen Inject 0-7 Units into the skin 3 (three) times daily with meals. 70-120 = 0 units, 121-150 = 1 unit, 151-200 = 2 units, 201-250 = 3 units, 251-300 = 5 units, 301-350 = 7 units.    . insulin detemir (LEVEMIR) 100 UNIT/ML injection Inject 8 Units into the skin at bedtime.     [name] isometheptene-acetaminophen-dichloralphenazone (MIDRIN) 65-325-100 MG capsule Take 1 capsule by mouth as needed for headache. Maximum 5 capsules in 12 hours for migraine headaches, 8 capsules in 24 hours for tension headaches. 40 capsule 3  . LEVEMIR FLEXTOUCH 100 UNIT/ML Pen   3  . loperamide (IMODIUM) 2 MG capsule Take 2-4 mg by mouth as needed for diarrhea or loose stools.     [name] LORazepam (ATIVAN) 1 MG tablet Take 1 tablet (1 mg total) by mouth 2 (two) times daily. 60 tablet 3  . LYRICA 25 MG capsule Take 25 mg by mouth daily.    . memantine (NAMENDA XR) 28 MG CP24 24 hr capsule Take 1 capsule (28  mg total) by mouth daily. 30 capsule 5  . memantine (NAMENDA XR) 7 MG CP24 24 hr capsule One tablet daily for one week, then take 2 capsules daily for one week, then take 3 capsules daily for one week. 42 capsule 0  . mometasone (NASONEX) 50 MCG/ACT nasal spray Place 2 sprays into both nostrils daily as needed (for congestion).     . nicotine (NICODERM CQ - DOSED IN MG/24 HR) 7 mg/24hr patch Place 7 mg onto the skin daily.    [name] omeprazole (PRILOSEC) 40 MG capsule Take 1 capsule (40  mg total) by mouth 2 (two) times daily. 60 capsule 11  . ondansetron (ZOFRAN) 8 MG tablet Take 1 tablet (8 mg total) by mouth 2 (two) times daily. 40 tablet 1  . Polyvinyl Alcohol (LIQUID TEARS OP) Place 1-2 drops into both eyes as needed (for dry eyes).    . saccharomyces boulardii (FLORASTOR) 250 MG capsule Take 1 capsule (250 mg total) by mouth 2 (two) times daily. 60 capsule 0  . sertraline (ZOLOFT) 25 MG tablet   6  . topiramate (TOPAMAX) 100 MG tablet Take 100 mg by mouth 2 (two) times daily.    [name] triamcinolone (KENALOG) 0.1 % ointment Apply 1 application topically 2 (two) times daily as needed (itching).     . Vortioxetine HBr (BRINTELLIX) 20 MG TABS Take 20 mg by mouth daily.    [name] zolmitriptan (ZOMIG) 5 MG tablet Take 1 tablet (5 mg total) by mouth 2 (two) times daily as needed for migraine. 10 tablet 3    Home: Home Living Family/patient expects to be discharged to:: Private residence Living Arrangements: Spouse/significant other Available Help at Discharge: Family, Personal care attendant, Available PRN/intermittently Type of Home: House Home Access: Level entry Home Layout: One level Home Equipment: Environmental consultant - 2 wheels, Bedside commode, Shower seat, Grab bars - tub/shower, Hand held shower head, Cane - single point  Functional History: Prior Function Level of Independence: Needs assistance Gait / Transfers Assistance Needed: Pt ambulating (I) PTA however history of falls.  ADL's / Homemaking Assistance  Needed: Pt states that aide was assisting with washing her back and supervision for bathing, and that she occasionally needed assist to dress. Functional Status:  Mobility: Bed Mobility Overal bed mobility: Needs Assistance Bed Mobility: Supine to Sit Supine to sit: Min assist General bed mobility comments: Min assist using bed pad to more efficiently square off hips at EOB Transfers Overall transfer level: Needs assistance Equipment used: 1 person hand held assist Transfers: Sit to/from Stand Sit to Stand: Mod assist General transfer comment: Heavy mod assist as pt is tending to lean posteriorly Ambulation/Gait Ambulation/Gait assistance: Mod assist Ambulation Distance (Feet): 5 Feet Assistive device: 1 person hand held assist Gait Pattern/deviations: Shuffle, Ataxic, Decreased step length - right, Decreased step length - left, Decreased stance time - right, Decreased stance time - left General Gait Details: Leans posteriorly requiring heavy mod assist to prevent fall; small, ataxic, almost choreo-like steps    ADL:    Cognition: Cognition Orientation Level: Oriented X4 Cognition Arousal/Alertness: Awake/alert Behavior During Therapy: Flat affect (Max encouragement to get OOB)  Blood pressure 123/69, pulse 94, temperature 98.7 F (37.1 C), temperature source Oral, resp. rate 15, height 5\' 2"  (1.575 m), weight 39.2 kg (86 lb 6.7 oz), SpO2 94 %. Physical Exam  Constitutional: No distress.  Cachectic appearing   HENT:  Head: Normocephalic and atraumatic.  Edentulous   Eyes: Conjunctivae and EOM are normal. Pupils are equal, round, and reactive to light.  Neck: No JVD present. No tracheal deviation present. No thyromegaly present.  Cardiovascular: Normal rate.   Respiratory: No respiratory distress.  GI: She exhibits no distension.  Lymphadenopathy:    She has no cervical adenopathy.  Neurological:  UE: 4-5 prox to 4/5 distal. LE: 3 hf, 3+ke and 4- ankles. No sensory  changes. dtr's 1+  Skin: She is not diaphoretic.    Results for orders placed or performed during the hospital encounter of 04/09/14 (from the past 24 hour(s))  Glucose, capillary     Status: Abnormal  Collection Time: 04/11/14  1:03 PM  Result Value Ref Range   Glucose-Capillary 128 (H) 70 - 99 mg/dL  Glucose, capillary     Status: Abnormal   Collection Time: 04/11/14  4:43 PM  Result Value Ref Range   Glucose-Capillary 100 (H) 70 - 99 mg/dL  Glucose, capillary     Status: Abnormal   Collection Time: 04/11/14 10:12 PM  Result Value Ref Range   Glucose-Capillary 128 (H) 70 - 99 mg/dL   Comment 1 Notify RN    Comment 2 Documented in Chart   CBC     Status: Abnormal   Collection Time: 04/12/14  5:24 AM  Result Value Ref Range   WBC 8.1 4.0 - 10.5 K/uL   RBC 3.14 (L) 3.87 - 5.11 MIL/uL   Hemoglobin 9.7 (L) 12.0 - 15.0 g/dL   HCT 28.1 (L) 36.0 - 46.0 %   MCV 89.5 78.0 - 100.0 fL   MCH 30.9 26.0 - 34.0 pg   MCHC 34.5 30.0 - 36.0 g/dL   RDW 15.4 11.5 - 15.5 %   Platelets 116 (L) 150 - 400 K/uL  Basic metabolic panel     Status: Abnormal (Preliminary result)   Collection Time: 04/12/14  5:24 AM  Result Value Ref Range   Sodium 136 135 - 145 mmol/L   Potassium PENDING 3.5 - 5.1 mmol/L   Chloride 113 (H) 96 - 112 mmol/L   CO2 15 (L) 19 - 32 mmol/L   Glucose, Bld 46 (L) 70 - 99 mg/dL   BUN 16 6 - 23 mg/dL   Creatinine, Ser 0.93 0.50 - 1.10 mg/dL   Calcium 8.6 8.4 - 10.5 mg/dL   GFR calc non Af Amer 63 (L) >90 mL/min   GFR calc Af Amer 73 (L) >90 mL/min   Anion gap 8 5 - 15  Glucose, capillary     Status: Abnormal   Collection Time: 04/12/14  7:36 AM  Result Value Ref Range   Glucose-Capillary 40 (LL) 70 - 99 mg/dL  Glucose, capillary     Status: Abnormal   Collection Time: 04/12/14  7:43 AM  Result Value Ref Range   Glucose-Capillary 44 (LL) 70 - 99 mg/dL  Glucose, capillary     Status: Abnormal   Collection Time: 04/12/14  8:08 AM  Result Value Ref Range    Glucose-Capillary 57 (L) 70 - 99 mg/dL   Dg Chest 2 View  04/11/2014   CLINICAL DATA:  Pneumonia.  Hx breast cancer, COPD, and diabetes.  EXAM: CHEST - 2 VIEW  COMPARISON:  04/09/2014  FINDINGS: Worsening coarse airspace consolidation in the anterior posterior segments right upper lobe and scattered through the right mid lobe. Left lung remains clear, with somewhat coarse attenuated bronchovascular markings. Surgical clips in the left hilum. Suspect small bilateral pleural effusions. Heart size remains normal. Atheromatous aorta. Previous left thoracotomy.  Old healed right rib fractures. Surgical clips in the left upper abdomen  IMPRESSION: 1. Worsening right upper and middle lobe airspace disease   Electronically Signed   By: Arne Cleveland M.D.   On: 04/11/2014 11:29   Ct Head Wo Contrast  04/11/2014   CLINICAL DATA:  Unsteady gait, confusion  EXAM: CT HEAD WITHOUT CONTRAST  TECHNIQUE: Contiguous axial images were obtained from the base of the skull through the vertex without intravenous contrast.  COMPARISON:  MR 10/17/2013 and earlier studies  FINDINGS: Atherosclerotic and physiologic intracranial calcifications. Stable small anterior falcine lipoma. Diffuse parenchymal atrophy. Patchy areas of hypoattenuation  in deep and periventricular [place] matter bilaterally. Negative for acute intracranial hemorrhage, mass lesion, acute infarction, midline shift, or mass-effect. Acute infarct may be inapparent on noncontrast CT. Ventricles and sulci symmetric. Bone windows demonstrate no focal lesion.  IMPRESSION: 1. Negative for bleed or other acute intracranial process. 2. Atrophy and nonspecific [place] matter changes.   Electronically Signed   By: Arne Cleveland M.D.   On: 04/11/2014 11:30   US Renal  04/10/2014   CLINICAL DATA:  Urinary retention.  Initial encounter.  EXAM: RENAL/URINARY TRACT ULTRASOUND COMPLETE  COMPARISON:  Renal ultrasound performed 03/12/2014, and CT of the abdomen and pelvis from  12/31/2013  FINDINGS: Right Kidney:  Length: 11.6 cm. Diffusely increased parenchymal echogenicity raises concern for medical renal disease. No hydronephrosis visualized. There is a 3 mm nonobstructing stone at the lower pole of the right kidney. A small 0.9 cm cyst is noted at the lower pole of the right kidney.  Left Kidney:  Length: 11.8 cm. Mildly increased parenchymal echogenicity raises concern for medical renal disease. No hydronephrosis visualized. There is a 5 mm nonobstructing stone at the lower pole of the left kidney. A 1.6 cm cyst is noted at the interpole region of the left kidney.  Bladder:  Decompressed, with a Foley catheter in place.  IMPRESSION: 1. No evidence of hydronephrosis. 2. Diffusely increased renal parenchymal echogenicity raises concern for medical renal disease. 3. Nonobstructing bilateral renal stones again noted. 4. Small bilateral renal cysts seen.   Electronically Signed   By: Garald Balding M.D.   On: 04/10/2014 12:21    Assessment/Plan: Diagnosis: severe deconditioning after pneumonia, respiratory failure 1. Does the need for close, 24 hr/day medical supervision in concert with the patient's rehab needs make it unreasonable for this patient to be served in a less intensive setting? Yes 2. Co-Morbidities requiring supervision/potential complications: dm 2, chronic pancreatitis, severe malnutrition 3. Due to bladder management, bowel management, safety, skin/wound care, disease management and medication administration, does the patient require 24 hr/day rehab nursing? Yes 4. Does the patient require coordinated care of a physician, rehab nurse, PT (1-2 hrs/day, 5 days/week) and OT (1-2 hrs/day, 5 days/week) to address physical and functional deficits in the context of the above medical diagnosis(es)? Yes Addressing deficits in the following areas: balance, endurance, locomotion, strength, transferring, bowel/bladder control, bathing, dressing, feeding, grooming and  toileting 5. Can the patient actively participate in an intensive therapy program of at least 3 hrs of therapy per day at least 5 days per week? Yes 6. The potential for patient to make measurable gains while on inpatient rehab is good 7. Anticipated functional outcomes upon discharge from inpatient rehab are supervision and min assist  with PT, supervision and min assist with OT, n/a with SLP. 8. Estimated rehab length of stay to reach the above functional goals is: 12-15 days 9. Does the patient have adequate social supports and living environment to accommodate these discharge functional goals? Yes 10. Anticipated D/C setting: Home 11. Anticipated post D/C treatments: [facility] therapy 12. Overall Rehab/Functional Prognosis: good  RECOMMENDATIONS: This patient's condition is appropriate for continued rehabilitative care in the following setting: CIR Patient has agreed to participate in recommended program. Yes Note that insurance prior authorization may be required for reimbursement for recommended care.  Comment: Rehab Admissions Coordinator to follow up.  Thanks,  Meredith Staggers, MD, Mellody Drown     04/12/2014

## 2014-04-12 NOTE — Progress Notes (Addendum)
[facility] PROGRESS NOTE  Lindsey Gomez MKL:[id number] DOB: 1945-04-11 DOA: 04/09/2014 PCP: Mathews Argyle, MD  Brief narrative 69 year old female with uncontrolled type 2 diabetes mellitus, chronic pancreatitis status post partial pancreatectomy, chronic migraine headaches, GERD, fibromyalgia history of right-sided breast cancer, essential hypertension, COPD (quit smoking 2 months back) and mild memory impairment who was recently hospitalized for nonketotic hyperosmolar state and acute kidney injury the was brought to the ED with fever of 103.28F at home associated with productive cough with greenish sputum for the past 2-3 days and some dyspnea on exertion and generalized fatigue. Patient admitted with healthcare associated pneumonia.  Assessment/Plan: Principal Problem:  HCAP (healthcare-associated pneumonia) vs aspiration pneumonia Culture so far negative.  Check sputum for Gram stain and culture. Urine strep antigen negative. urine for Legionella pending . HIV antibody pending -Patient still febrile with follow-up chest x-ray showing worsened right upper lobe pneumonia. Cefepime switched to Zosyn for anaerobic coverage -Supportive care with antitussives, Tylenol and antiemetics. -Symptoms concerning for aspiration pneumonia. Seen by swallow eval and recommend dysphagia level II diet with nectar thick liquid.    Active Problems:  Acute renal failure Likely secondary to dehydration. Resolved with IV fluids. Avoid nephrotoxins.  Urinary retention Noted for about 500 mL urine retained in bladder scan overnight. Renal ultrasound shows possible medical renal disease. Patient placed on Foley catheter on 1/30. Removed this morning . On it for urine output  Hypokalemia replenish  with oral potassium and in IV fluids   DM2 (diabetes mellitus, type 2) Uncontrolled with A1c greater than 8. Recent hospitalization for hyperosmolar nonketotic state.  Now having hypoglycemic  episodes. Likely  due to poor by mouth intake. Will reduce Lantus to 8 units at bedtime. Added D5 to IV fluids.   Thrombocytopenia Likely secondary to acute illness. Slowly improving   Anxiety state Continue home dose Ativan   Essential hypertension Blood pressure stable. 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 Stent was removed in October 2015.[name] Sees Dr Deatra Ina. On dicyclomine for ?abd cramping.     GERD (gastroesophageal reflux disease) and Barrett's esophagus Continue PPI  Chronic migraine On multiple medications which have been resumed. Follows with outpatient neurology   Abnormality of gait Follows with neurology as outpatient. Previous imaging showing age  disproportionate mild generalized cerebral atrophy. CT head repeated given urinary incontinence and unsteady gait and no new findings   Severe protein-calorie malnutrition Nutrition consult   Memory impairment Mild. Continue home medication   Chronic migraine On multiple medications which will be continued. Follows with Dr. Jannifer Franklin with neurology.   Hypokalemia Replenish  COPD and ongoing tobacco use Continue home inhalers. Add prn albuterol nebs. Continue nicotine patch.   Iron def anemia Continue iron sulfate  Patient is also on daily Solu Cortef 10 mg daily. Not clear why she is on it. Need to verify with her PCP   DVT prophylaxis: SCD  Diet: Dysphagia level II nectar thick          Code Status: full code Family Communication: None at bedside Disposition Plan: PT evaluation recommends inpatient rehabilitation. Consulted   Consultants:  None  Procedures:  None  Antibiotics:  IV vancomycin 1/29--  IV cefepime 1/29-2/1  IV zosyn 2/1--  HPI/Subjective: Till having temperature spike with maximum temperature of 101.50F. Follow-up chest  x-ray showing worsening right upper lobe pneumonia. Patient hypoglycemic this morning , improve with orange juice. Foley  removed in am. Had 2 episodes of diarrhea this am. Reports her cough and headache to be better.  Objective: Filed Vitals:   04/12/14 0657  BP:   Pulse:   Temp: 98.7 F (37.1 C)  Resp:     Intake/Output Summary (Last 24 hours) at 04/12/14 0958 Last data filed at 04/12/14 0859  Gross per 24 hour  Intake      0 ml  Output   2600 ml  Net  -2600 ml   Filed Weights   04/09/14 1006 04/09/14 1259  Weight: 39.009 kg (86 lb) 39.2 kg (86 lb 6.7 oz)    Exam:   General:  Elderly thin built female in no acute distress  HEENT: No pallor, moist oral mucosa, supple neck  Chest: Coarse Crackles over right lung, no rhonchi or wheeze  Gastrointestinal: Soft, nondistended, nontender  Musculoskeletal: Warm, no edema,  CNS: Alert and oriented, flat affect    Data Reviewed: Basic Metabolic Panel:  Recent Labs Lab 04/09/14 1054 04/10/14 0425 04/11/14 0525 04/12/14 0524  NA 136 138 137 136  K 3.1* 2.9* 3.4* 2.8*  CL 108 112 113* 113*  CO2 18* 19 18* 15*  GLUCOSE 154* 147* 203* 46*  BUN 27* 22 20 16   CREATININE 1.27* 1.14* 1.04 0.93  CALCIUM 9.4 9.2 8.8 8.6   Liver Function Tests: No results for input(s): AST, ALT, ALKPHOS, BILITOT, PROT, ALBUMIN in the last 168 hours. No results for input(s): LIPASE, AMYLASE in the last 168 hours. No results for input(s): AMMONIA in the last 168 hours. CBC:  Recent Labs Lab 04/09/14 1054 04/10/14 0425 04/11/14 0525 04/12/14 0524  WBC 5.3 8.5 7.2 8.1  NEUTROABS 3.9  --   --   --   HGB 13.8 10.0* 9.2* 9.7*  HCT 40.8 29.5* 27.3* 28.1*  MCV 92.3 92.2 90.7 89.5  PLT 59* 87* 81* 116*   Cardiac Enzymes: No results for input(s): CKTOTAL, CKMB, CKMBINDEX, TROPONINI in the last 168 hours. BNP (last 3 results) No results for input(s): PROBNP in the last 8760 hours. CBG:  Recent Labs Lab 04/11/14 2212 04/12/14 0736  04/12/14 0743 04/12/14 0808 04/12/14 0826  GLUCAP 128* 40* 44* 57* 79    Recent Results (from the past 240 hour(s))  Blood culture (routine x 2)     Status: None (Preliminary result)   Collection Time: 04/09/14 10:55 AM  Result Value Ref Range Status   Specimen Description BLOOD LEFT ARM  Final   Special Requests BOTTLES DRAWN AEROBIC AND ANAEROBIC 5CC  Final   Culture   Final           BLOOD CULTURE RECEIVED NO GROWTH TO DATE CULTURE WILL BE HELD FOR 5 DAYS BEFORE ISSUING A FINAL NEGATIVE REPORT Performed at [facility]    Report Status PENDING  Incomplete  Blood culture (routine x 2)     Status: None (Preliminary result)   Collection Time: 04/09/14 11:00 AM  Result Value Ref Range Status   Specimen Description BLOOD RIGHT ANTECUBITAL  Final   Special Requests BOTTLES DRAWN AEROBIC AND ANAEROBIC 5CC  Final   Culture   Final           BLOOD CULTURE RECEIVED NO GROWTH TO DATE CULTURE WILL BE HELD FOR 5 DAYS BEFORE ISSUING A FINAL NEGATIVE REPORT Performed at [facility]    Report Status PENDING  Incomplete     Studies: Dg Chest 2 View  04/11/2014   CLINICAL DATA:  Pneumonia.  Hx breast  cancer, COPD, and diabetes.  EXAM: CHEST - 2 VIEW  COMPARISON:  04/09/2014  FINDINGS: Worsening coarse airspace consolidation in the anterior posterior segments right upper lobe and scattered through the right mid lobe. Left lung remains clear, with somewhat coarse attenuated bronchovascular markings. Surgical clips in the left hilum. Suspect small bilateral pleural effusions. Heart size remains normal. Atheromatous aorta. Previous left thoracotomy.  Old healed right rib fractures. Surgical clips in the left upper abdomen  IMPRESSION: 1. Worsening right upper and middle lobe airspace disease   Electronically Signed   By: Arne Cleveland M.D.   On: 04/11/2014 11:29   Ct Head Wo Contrast  04/11/2014   CLINICAL DATA:  Unsteady gait, confusion  EXAM: CT HEAD WITHOUT CONTRAST  TECHNIQUE:  Contiguous axial images were obtained from the base of the skull through the vertex without intravenous contrast.  COMPARISON:  MR 10/17/2013 and earlier studies  FINDINGS: Atherosclerotic and physiologic intracranial calcifications. Stable small anterior falcine lipoma. Diffuse parenchymal atrophy. Patchy areas of hypoattenuation in deep and periventricular Alkema matter bilaterally. Negative for acute intracranial hemorrhage, mass lesion, acute infarction, midline shift, or mass-effect. Acute infarct may be inapparent on noncontrast CT. Ventricles and sulci symmetric. Bone windows demonstrate no focal lesion.  IMPRESSION: 1. Negative for bleed or other acute intracranial process. 2. Atrophy and nonspecific [place] matter changes.   Electronically Signed   By: Arne Cleveland M.D.   On: 04/11/2014 11:30   US Renal  04/10/2014   CLINICAL DATA:  Urinary retention.  Initial encounter.  EXAM: RENAL/URINARY TRACT ULTRASOUND COMPLETE  COMPARISON:  Renal ultrasound performed 03/12/2014, and CT of the abdomen and pelvis from 12/31/2013  FINDINGS: Right Kidney:  Length: 11.6 cm. Diffusely increased parenchymal echogenicity raises concern for medical renal disease. No hydronephrosis visualized. There is a 3 mm nonobstructing stone at the lower pole of the right kidney. A small 0.9 cm cyst is noted at the lower pole of the right kidney.  Left Kidney:  Length: 11.8 cm. Mildly increased parenchymal echogenicity raises concern for medical renal disease. No hydronephrosis visualized. There is a 5 mm nonobstructing stone at the lower pole of the left kidney. A 1.6 cm cyst is noted at the interpole region of the left kidney.  Bladder:  Decompressed, with a Foley catheter in place.  IMPRESSION: 1. No evidence of hydronephrosis. 2. Diffusely increased renal parenchymal echogenicity raises concern for medical renal disease. 3. Nonobstructing bilateral renal stones again noted. 4. Small bilateral renal cysts seen.   Electronically  Signed   By: Garald Balding M.D.   On: 04/10/2014 12:21    Scheduled Meds: . albuterol  2.5 mg Nebulization TID  . amLODipine  10 mg Oral Daily  . atenolol  100 mg Oral Daily  . atorvastatin  10 mg Oral q1800  . dicyclomine  10 mg Oral TID AC  . ferrous sulfate  325 mg Oral Q breakfast  . fluticasone  1 spray Each Nare Daily  . guaiFENesin  600 mg Oral BID  . hydrocortisone  10 mg Oral Daily  . insulin aspart  0-15 Units Subcutaneous TID WC  . insulin aspart  0-5 Units Subcutaneous QHS  . insulin detemir  12 Units Subcutaneous QHS  . LORazepam  1 mg Oral BID  . memantine  28 mg Oral Daily  . nicotine  7 mg Transdermal Daily  . ondansetron  8 mg Oral BID  . pantoprazole  40 mg Oral Daily  . piperacillin-tazobactam (ZOSYN)  IV  3.375 g  Intravenous 3 times per day  . potassium chloride  40 mEq Oral Once  . pregabalin  25 mg Oral Daily  . saccharomyces boulardii  250 mg Oral BID  . sertraline  25 mg Oral Daily  . sodium bicarbonate  650 mg Oral BID  . topiramate  100 mg Oral BID  . vancomycin  500 mg Intravenous Q24H  . Vortioxetine HBr  20 mg Oral Daily   Continuous Infusions: . sodium chloride 0.9 % 1,000 mL with potassium chloride 40 mEq infusion        Time spent: 35 minutes    Dallys Nowakowski, [place]  [facility] Pager [phone]. If 7PM-7AM, please contact night-coverage at [email or web address], password [facility] 04/12/2014, 9:58 AM  LOS: 3 days

## 2014-04-12 NOTE — Progress Notes (Signed)
[place] for vancomycin and antibiotic adjustment Indication: HCAP, possible aspiration PNA  Allergies  Allergen Reactions  . Milnacipran Swelling and Other (See Comments)    Headache and hand swelling   . Rizatriptan Benzoate Nausea And Vomiting  . Gabapentin Other (See Comments)    Weak muscles  . Moxifloxacin Other (See Comments)    Unknown  . Sulfonamide Derivatives Rash  . Venlafaxine Other (See Comments)    Unknown    Patient Measurements: Height: 5\' 2"  (157.5 cm) Weight: 86 lb 6.7 oz (39.2 kg) IBW/kg (Calculated) : 50.1  Vital Signs: Temp: 98.7 F (37.1 C) (02/01 0657) BP: 123/69 mmHg (02/01 0503) Pulse Rate: 94 (02/01 0503) Intake/Output from previous day: 01/31 0701 - 02/01 0700 In: -  Out: 2100 [Urine:2100] Intake/Output from this shift: Total I/O In: 480 [P.O.:480] Out: 500 [Urine:500]  Labs:  Recent Labs  04/10/14 0425 04/11/14 0525 04/12/14 0524  WBC 8.5 7.2 8.1  HGB 10.0* 9.2* 9.7*  PLT 87* 81* 116*  CREATININE 1.14* 1.04 0.93   Estimated Creatinine Clearance: 36.8 mL/min (by C-G formula based on Cr of 0.93). No results for input(s): VANCOTROUGH, VANCOPEAK, VANCORANDOM, GENTTROUGH, GENTPEAK, GENTRANDOM, TOBRATROUGH, TOBRAPEAK, TOBRARND, AMIKACINPEAK, AMIKACINTROU, AMIKACIN in the last 72 hours.   Microbiology: Recent Results (from the past 720 hour(s))  Blood culture (routine x 2)     Status: None (Preliminary result)   Collection Time: 04/09/14 10:55 AM  Result Value Ref Range Status   Specimen Description BLOOD LEFT ARM  Final   Special Requests BOTTLES DRAWN AEROBIC AND ANAEROBIC 5CC  Final   Culture   Final           BLOOD CULTURE RECEIVED NO GROWTH TO DATE CULTURE WILL BE HELD FOR 5 DAYS BEFORE ISSUING A FINAL NEGATIVE REPORT Performed at [facility]    Report Status PENDING  Incomplete  Blood culture (routine x 2)     Status: None (Preliminary result)   Collection Time: 04/09/14 11:00 AM   Result Value Ref Range Status   Specimen Description BLOOD RIGHT ANTECUBITAL  Final   Special Requests BOTTLES DRAWN AEROBIC AND ANAEROBIC 5CC  Final   Culture   Final           BLOOD CULTURE RECEIVED NO GROWTH TO DATE CULTURE WILL BE HELD FOR 5 DAYS BEFORE ISSUING A FINAL NEGATIVE REPORT Performed at [facility]    Report Status PENDING  Incomplete   Assessment: 70 yoF admitted with fever and fatigue with 3 day history of weakness and cough PTA.  Started on vancomycin and cefepime, today changed to vancomycin and Zosyn to cover anaerobes d/t possible aspiration PNA. Tmax/24h 101.7, WBC wnl. SCr 0.93 with est CrCl ~108mL/min- improving  Vanc 1/29>> Cefepime 1/29 >>2/1 Zosyn 2/1>>  1/29 BCx: NGTD 1/29 RCx: no result S pneumo neg legionella still IP  Goal of Therapy:  Vancomycin trough level 15-20 mcg/ml  Plan:  -Vanc 500mg  IV Q24h -Zosyn 3.375g IV q8h EI -vancomycin trough tomorrow at 1200 before 5th dose -[name] renal fxn, C&S, clinical status, LOT  Lindsey Gomez, PharmD, BCPS Clinical Pharmacist Pager: [phone] 04/12/2014 11:32 AM

## 2014-04-12 NOTE — Progress Notes (Signed)
Rehab Admissions Coordinator Note:  Patient was screened by Cleatrice Burke for appropriateness for an Inpatient Acute Rehab Consult per PT recommendation At this time, we are recommending Inpatient Rehab consult.and OT eval I will contact Attending for orders.   Cleatrice Burke 04/12/2014, 8:05 AM  I can be reached at [phone].

## 2014-04-12 NOTE — Progress Notes (Signed)
Hypoglycemic Event  CBG: 40  Treatment: 15 GM carbohydrate snack  Symptoms: Pale and Sweaty  Follow-up CBG: Time:0826 CBG Result:79  Possible Reasons for Event: Inadequate meal intake  Comments/MD notified:Dr. Dhungel  Pt's CBG started at 40, increased to 44, 57, then 79.    Norva Karvonen  Remember to initiate Hypoglycemia Order Set & complete

## 2014-04-12 NOTE — Progress Notes (Signed)
Rehab admissions - I met with pt in follow up to rehab MD consult to explain the possibility of inpatient rehab. Further questions were answered and informational brochures were given. Pt wants to pursue inpatient rehab and shared that she and her husband have great 24-7 caregiver support. Pt wanted to me to contact her husband, caregiver and dtr.  I then called pt's husband and pt's caregiver [place]. Further information was given about our rehab program and both are in support of pt coming to inpatient rehab. I called and left a voicemail with pt's dtr Ivin Booty, who lives in [place].  I explained to pt/family/caregiver that we will need to get insurance authorization from [facility]. I will need completed OT evaluation as well for insurance purposes.  I will open her case with [facility] and share an update tomorrow. We will consider possible inpatient rehab admission pending her medical clearance, insurance authorization and our bed availability.  Please call me with any questions. Thanks.  Nanetta Batty, PT Rehabilitation Admissions Coordinator [phone]

## 2014-04-13 DIAGNOSIS — D696 Thrombocytopenia, unspecified: Secondary | ICD-10-CM

## 2014-04-13 DIAGNOSIS — E43 Unspecified severe protein-calorie malnutrition: Secondary | ICD-10-CM

## 2014-04-13 LAB — BASIC METABOLIC PANEL
Anion gap: 7 (ref 5–15)
BUN: 14 mg/dL (ref 6–23)
CHLORIDE: 118 mmol/L — AB (ref 96–112)
CO2: 16 mmol/L — ABNORMAL LOW (ref 19–32)
Calcium: 8.7 mg/dL (ref 8.4–10.5)
Creatinine, Ser: 1.05 mg/dL (ref 0.50–1.10)
GFR, EST AFRICAN AMERICAN: 63 mL/min — AB (ref >90)
GFR, EST NON AFRICAN AMERICAN: 54 mL/min — AB (ref >90)
Glucose, Bld: 75 mg/dL (ref 70–99)
Potassium: 3.6 mmol/L (ref 3.5–5.1)
Sodium: 141 mmol/L (ref 135–145)

## 2014-04-13 LAB — CBC
HCT: 26 % — ABNORMAL LOW (ref 36.0–46.0)
HEMOGLOBIN: 8.8 g/dL — AB (ref 12.0–15.0)
MCH: 30.8 pg (ref 26.0–34.0)
MCHC: 33.8 g/dL (ref 30.0–36.0)
MCV: 90.9 fL (ref 78.0–100.0)
Platelets: 104 10*3/uL — ABNORMAL LOW (ref 150–400)
RBC: 2.86 MIL/uL — ABNORMAL LOW (ref 3.87–5.11)
RDW: 15.9 % — AB (ref 11.5–15.5)
WBC: 5.6 10*3/uL (ref 4.0–10.5)

## 2014-04-13 LAB — GLUCOSE, CAPILLARY
GLUCOSE-CAPILLARY: 41 mg/dL — AB (ref 70–99)
GLUCOSE-CAPILLARY: 71 mg/dL (ref 70–99)
GLUCOSE-CAPILLARY: 145 mg/dL — AB (ref 70–99)
Glucose-Capillary: 267 mg/dL — ABNORMAL HIGH (ref 70–99)
Glucose-Capillary: 128 mg/dL — ABNORMAL HIGH (ref 70–99)

## 2014-04-13 LAB — VANCOMYCIN, TROUGH: VANCOMYCIN TR: 7.6 ug/mL — AB (ref 10.0–20.0)

## 2014-04-13 MED ORDER — ALBUTEROL SULFATE (2.5 MG/3ML) 0.083% IN NEBU: 2.5000 mg | INHALATION_SOLUTION | RESPIRATORY_TRACT | Status: DC | PRN

## 2014-04-13 MED ORDER — VANCOMYCIN HCL IN DEXTROSE 1-5 GM/200ML-% IV SOLN
1000.0000 mg | INTRAVENOUS | Status: DC
  Filled 2014-04-13: qty 200

## 2014-04-13 MED ORDER — VANCOMYCIN HCL 500 MG IV SOLR
500.0000 mg | Freq: Once | INTRAVENOUS | Status: AC
  Administered 2014-04-13: 500 mg via INTRAVENOUS
  Filled 2014-04-13: qty 500

## 2014-04-13 MED ORDER — PIPERACILLIN-TAZOBACTAM 3.375 G IVPB
3.3750 g | Freq: Three times a day (TID) | INTRAVENOUS | Status: DC
  Administered 2014-04-13 – 2014-04-14 (×2): 3.375 g via INTRAVENOUS
  Filled 2014-04-13 (×4): qty 50

## 2014-04-13 NOTE — Progress Notes (Signed)
Speech Language Pathology Treatment: Dysphagia  Patient Details Name: Lindsey Gomez MRN: [id number] DOB: 07-17-1945 Today's Date: 04/13/2014 Time: 8616-8372 SLP Time Calculation (min) (ACUTE ONLY): 12 min  Assessment / Plan / Recommendation Clinical Impression  Pt continues to exhibit signs of airway compromise with thin liquids despite Mod cueing from therapist for slower rate of intake and smaller sip size. She does not have overt difficulty noted with nectar thick liquids despite impulsivity with intake. She still does not have her dentures present and reports occasional difficulty with mastication with current Dys 2 textures. Recommend to continue with Dys 2 diet and nectar thick liquids. Pt may benefit from further objective testing should difficulty with thin liquids persist.   HPI HPI: 69 year old female with uncontrolled type 2 diabetes mellitus, chronic pancreatitis status post partial pancreatectomy, chronic migraine headaches, GERD, fibromyalgia, history of right-sided breast cancer, essential hypertension, COPD with ongoing tobacco use and mild memory impairment who was recently hospitalized for nonketotic hyper osmolar state and acute kidney injury was brought to the ED for fever of 103.68F and CXR concerning for RUL PNA.   Pertinent Vitals Pain Assessment: No/denies pain  SLP Plan  Continue with current plan of care    Recommendations Diet recommendations: Dysphagia 2 (fine chop);Nectar-thick liquid Liquids provided via: Cup;No straw Medication Administration: Whole meds with puree Supervision: Patient able to self feed;Full supervision/cueing for compensatory strategies Compensations: Slow rate;Small sips/bites Postural Changes and/or Swallow Maneuvers: Seated upright 90 degrees;Upright 30-60 min after meal       Oral Care Recommendations: Oral care BID Follow up Recommendations: Inpatient Rehab Plan: Continue with current plan of care    GO     Germain Osgood, M.A.  CCC-SLP [phone]  Germain Osgood 04/13/2014, 4:23 PM

## 2014-04-13 NOTE — Progress Notes (Signed)
Inpatient Diabetes Program Recommendations  AACE/ADA: New Consensus Statement on Inpatient Glycemic Control (2013)  Target Ranges:  Prepandial:   less than 140 mg/dL      Peak postprandial:   less than 180 mg/dL (1-2 hours)      Critically ill patients:  140 - 180 mg/dL   Inpatient Diabetes Program Recommendations Insulin - Basal: Decrease Levemir to 4 units  Decrease NOVOLOG to SENSITIVE scale TID Thank you  Raoul Pitch BSN, RN,CDE Inpatient Diabetes Coordinator [phone] (team pager)

## 2014-04-13 NOTE — Progress Notes (Addendum)
[facility] PROGRESS NOTE  Lindsey Gomez:[id number] DOB: 1945/12/10 DOA: 04/09/2014 PCP: Mathews Argyle, MD  Brief narrative 69 year old female with uncontrolled type 2 diabetes mellitus, chronic pancreatitis status post partial pancreatectomy, chronic migraine headaches, GERD, fibromyalgia history of right-sided breast cancer, essential hypertension, COPD (quit smoking 2 months back) and mild memory impairment who was recently hospitalized for nonketotic hyperosmolar state and acute kidney injury the was brought to the ED with fever of 103.81F at home associated with productive cough with greenish sputum for the past 2-3 days and some dyspnea on exertion and generalized fatigue. Patient admitted with healthcare associated pneumonia.  Assessment/Plan: Principal Problem:  HCAP (healthcare-associated pneumonia) vs aspiration pneumonia Culture so far negative.  Check sputum for Gram stain and culture. Urine strep antigen negative. urine for Legionella and HIV antibody negative. - follow-up chest x-ray showing worsened right upper lobe pneumonia. Cefepime switched to Zosyn for anaerobic coverage. -Has low-grade fever for the past 24 hours and clinically improving. -Supportive care with antitussives, Tylenol and antiemetics. -Symptoms concerning for aspiration pneumonia. Seen by swallow eval and recommend dysphagia level II diet with nectar thick liquid.    Active Problems:  Acute renal failure Likely secondary to dehydration. Resolved with IV fluids. Avoid nephrotoxins.  Urinary retention Noted for about 500 mL urine retained in bladder scan overnight. Renal ultrasound shows possible medical renal disease.  Foley catheter placed for 24 hours and removed. Has been able to void without difficulty.  Hypokalemia replenished   DM2 (diabetes mellitus, type 2) Uncontrolled with A1c greater than 8. Recent hospitalization for hyperosmolar nonketotic state.  Patient had episode of  hypoglycemia on 2/1 with fsg in 40s. Likely  due to poor by mouth intake. reduced Lantus to 8 units at bedtime.     Thrombocytopenia Likely secondary to acute illness. Slowly improving   Anxiety state Continue home dose Ativan   Essential hypertension Blood pressure stable. 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 Stent was removed in October 2015.[name] Sees Dr Deatra Ina. On dicyclomine for ?abd cramping.  Metabolic acidosis  added bicarb supplements      GERD (gastroesophageal reflux disease) and Barrett's esophagus Continue PPI  Chronic migraine On multiple medications which have been resumed. Follows with outpatient neurology   Abnormality of gait Follows with neurology as outpatient. Previous imaging showing age  disproportionate mild generalized cerebral atrophy. CT head repeated given urinary incontinence and unsteady gait and no new findings.   Severe protein-calorie malnutrition Nutrition consult   Memory impairment Mild. Continue home medications.   Chronic migraine On multiple medications which will be continued. Follows with Dr. Jannifer Franklin with neurology.   Hypokalemia Replenish  COPD and ongoing tobacco use Continue home inhalers. Add prn albuterol nebs. Continue nicotine patch.   Iron def anemia Continue iron sulfate  Patient is also on daily Solu Cortef 10 mg daily. Not clear why she is on it. Need to verify with her PCP   DVT prophylaxis: SCD  Diet: Dysphagia level II nectar thick     Code Status: full code Family Communication: daughter sharon over the phone ( she lives in [place]). recommended to contact her care given launa for any acute issues. Disposition Plan: PT evaluation recommends inpatient rehabilitation. Consulted. pending insurance authorization. Was notified during     Consultants:  None  Procedures:  None  Antibiotics:  IV vancomycin 1/29--  IV cefepime 1/29-2/1  IV zosyn 2/1--  HPI/Subjective:  Objective: Filed Vitals:   04/13/14 1439  BP: 132/72  Pulse: 93  Temp: 99 F (37.2 C)  Resp: 17    Intake/Output Summary (Last 24 hours) at 04/13/14 1633 Last data filed at 04/13/14 0933  Gross per 24 hour  Intake 1944.5 ml  Output      0 ml  Net 1944.5 ml   Filed Weights   04/09/14 1006 04/09/14 1259  Weight: 39.009 kg (86 lb) 39.2 kg (86 lb 6.7 oz)    Exam:   General:  Elderly thin built female in no acute distress  HEENT: No pallor, moist oral mucosa, supple neck  Chest: fine crackles over right lung base,  no rhonchi or wheeze  Gastrointestinal: Soft, nondistended, nontender  Musculoskeletal: Warm, no edema,  CNS: Alert and oriented, flat affect    Data Reviewed: Basic Metabolic Panel:  Recent Labs Lab 04/09/14 1054 04/10/14 0425 04/11/14 0525 04/12/14 0524 04/13/14 0521  NA 136 138 137 136 141  K 3.1* 2.9* 3.4* 2.8* 3.6  CL 108 112 113* 113* 118*  CO2 18* 19 18* 15* 16*  GLUCOSE 154* 147* 203* 46* 75  BUN 27* 22 20 16 14   CREATININE 1.27* 1.14* 1.04 0.93 1.05  CALCIUM 9.4 9.2 8.8 8.6 8.7   Liver Function Tests: No results for input(s): AST, ALT, ALKPHOS, BILITOT, PROT, ALBUMIN in the last 168 hours. No results for input(s): LIPASE, AMYLASE in the last 168 hours. No results for input(s): AMMONIA in the last 168 hours. CBC:  Recent Labs Lab 04/09/14 1054 04/10/14 0425 04/11/14 0525 04/12/14 0524 04/13/14 0521  WBC 5.3 8.5 7.2 8.1 5.6  NEUTROABS 3.9  --   --   --   --   HGB 13.8 10.0* 9.2* 9.7* 8.8*  HCT 40.8 29.5* 27.3* 28.1* 26.0*  MCV 92.3 92.2 90.7 89.5 90.9  PLT 59* 87* 81* 116* 104*   Cardiac Enzymes: No results for input(s): CKTOTAL, CKMB, CKMBINDEX, TROPONINI in the last 168 hours. BNP (last 3 results) No results for input(s): PROBNP in the last 8760 hours. CBG:  Recent  Labs Lab 04/12/14 1725 04/12/14 2044 04/13/14 0805 04/13/14 0841 04/13/14 1145  GLUCAP 120* 176* 41* 71 267*    Recent Results (from the past 240 hour(s))  Blood culture (routine x 2)     Status: None (Preliminary result)   Collection Time: 04/09/14 10:55 AM  Result Value Ref Range Status   Specimen Description BLOOD LEFT ARM  Final   Special Requests BOTTLES DRAWN AEROBIC AND ANAEROBIC 5CC  Final   Culture   Final           BLOOD CULTURE RECEIVED NO GROWTH TO DATE CULTURE WILL BE HELD FOR 5 DAYS BEFORE ISSUING A FINAL NEGATIVE REPORT Performed at [facility]    Report Status PENDING  Incomplete  Blood culture (routine x 2)     Status: None (Preliminary result)   Collection Time: 04/09/14 11:00 AM  Result Value Ref Range Status   Specimen Description BLOOD RIGHT ANTECUBITAL  Final   Special Requests BOTTLES DRAWN AEROBIC AND ANAEROBIC 5CC  Final   Culture   Final           BLOOD CULTURE RECEIVED NO GROWTH TO DATE CULTURE WILL BE HELD FOR 5 DAYS BEFORE ISSUING A FINAL NEGATIVE REPORT Performed at [facility]    Report Status PENDING  Incomplete  Clostridium Difficile by PCR     Status: None   Collection Time: 04/12/14 12:37 PM  Result Value Ref Range Status   C difficile by pcr NEGATIVE NEGATIVE Final     Studies: No results found.  Scheduled Meds: . amLODipine  10 mg Oral Daily  . atenolol  100 mg Oral Daily  . atorvastatin  10 mg Oral q1800  . dicyclomine  10 mg Oral TID AC  . ferrous sulfate  325 mg Oral Q breakfast  . fluticasone  1 spray Each Nare Daily  . guaiFENesin  600 mg Oral BID  . hydrocortisone  10 mg Oral Daily  . insulin aspart  0-15 Units Subcutaneous TID WC  . insulin aspart  0-5 Units Subcutaneous QHS  . insulin detemir  8 Units Subcutaneous QHS  . LORazepam  1 mg Oral BID  . memantine  7 mg Oral BID  . nicotine  7 mg Transdermal Daily  . ondansetron  8 mg Oral BID  . pantoprazole  40 mg Oral Daily  . piperacillin-tazobactam  (ZOSYN)  IV  3.375 g Intravenous 3 times per day  . pregabalin  25 mg Oral BID  . saccharomyces boulardii  250 mg Oral BID  . sertraline  25 mg Oral Daily  . sodium bicarbonate  650 mg Oral BID  . topiramate  25 mg Oral BID  . vancomycin  500 mg Intravenous Once  . [START ON 04/14/2014] vancomycin  1,000 mg Intravenous Q24H   Continuous Infusions: . sodium chloride 0.9 % 1,000 mL with potassium chloride 40 mEq infusion 75 mL/hr at 04/13/14 0107      Time spent: 25 minutes    Wing Gfeller, [place]  [facility] Pager [phone]. If 7PM-7AM, please contact night-coverage at [email or web address], password [facility] 04/13/2014, 4:33 PM  LOS: 4 days

## 2014-04-13 NOTE — Care Management (Signed)
04-13-14 IM from Medicare given. Taye Cato RN BSN  

## 2014-04-13 NOTE — Progress Notes (Signed)
Rehab admissions - I am following pt's case and have now submitted today's OT evaluation for insurance purposes. I have spoke with [facility] RN representative and they are processing pt's case.  I will keep the pt/family and medical team aware of any updates. Thanks.  Nanetta Batty, PT Rehabilitation Admissions Coordinator [phone]

## 2014-04-13 NOTE — Progress Notes (Signed)
Hypoglycemic Event  CBG: 41  Treatment: 15 GM carbohydrate snack; orange juice  Symptoms: cold  Follow-up CBG: [id number] CBG Result:71  Possible Reasons for Event: Unknown  Comments/MD notified:Dr. Michaelene Song S  Remember to initiate Hypoglycemia Order Set & complete

## 2014-04-13 NOTE — Progress Notes (Signed)
[place] for vancomycin and antibiotic adjustment Indication: HCAP, possible aspiration PNA  Allergies  Allergen Reactions  . Milnacipran Swelling and Other (See Comments)    Headache and hand swelling   . Rizatriptan Benzoate Nausea And Vomiting  . Gabapentin Other (See Comments)    Weak muscles  . Moxifloxacin Other (See Comments)    Unknown  . Sulfonamide Derivatives Rash  . Venlafaxine Other (See Comments)    Unknown    Patient Measurements: Height: 5\' 2"  (157.5 cm) Weight: 86 lb 6.7 oz (39.2 kg) IBW/kg (Calculated) : 50.1  Vital Signs: Temp: 99.1 F (37.3 C) (02/02 0543) Temp Source: Oral (02/02 0543) BP: 122/74 mmHg (02/02 0543) Pulse Rate: 82 (02/02 0543) Intake/Output from previous day: 02/01 0701 - 02/02 0700 In: 2192.5 [P.O.:720; I.V.:1422.5; IV Piggyback:50] Out: 500 [Urine:500] Intake/Output from this shift: Total I/O In: 472 [P.O.:472] Out: -   Labs:  Recent Labs  04/11/14 0525 04/12/14 0524 04/13/14 0521  WBC 7.2 8.1 5.6  HGB 9.2* 9.7* 8.8*  PLT 81* 116* 104*  CREATININE 1.04 0.93 1.05   Estimated Creatinine Clearance: 32.6 mL/min (by C-G formula based on Cr of 1.05).  Recent Labs  04/13/14 1212  [facility] 7.6*     Microbiology: Recent Results (from the past 720 hour(s))  Blood culture (routine x 2)     Status: None (Preliminary result)   Collection Time: 04/09/14 10:55 AM  Result Value Ref Range Status   Specimen Description BLOOD LEFT ARM  Final   Special Requests BOTTLES DRAWN AEROBIC AND ANAEROBIC 5CC  Final   Culture   Final           BLOOD CULTURE RECEIVED NO GROWTH TO DATE CULTURE WILL BE HELD FOR 5 DAYS BEFORE ISSUING A FINAL NEGATIVE REPORT Performed at [facility]    Report Status PENDING  Incomplete  Blood culture (routine x 2)     Status: None (Preliminary result)   Collection Time: 04/09/14 11:00 AM  Result Value Ref Range Status   Specimen Description BLOOD RIGHT  ANTECUBITAL  Final   Special Requests BOTTLES DRAWN AEROBIC AND ANAEROBIC 5CC  Final   Culture   Final           BLOOD CULTURE RECEIVED NO GROWTH TO DATE CULTURE WILL BE HELD FOR 5 DAYS BEFORE ISSUING A FINAL NEGATIVE REPORT Performed at [facility]    Report Status PENDING  Incomplete  Clostridium Difficile by PCR     Status: None   Collection Time: 04/12/14 12:37 PM  Result Value Ref Range Status   C difficile by pcr NEGATIVE NEGATIVE Final   Assessment: 78 yoF admitted with fever and fatigue with 3 day history of weakness and cough PTA.  Started on vancomycin and cefepime, today changed to vancomycin and Zosyn to cover anaerobes d/t possible aspiration PNA. afeb/24h, WBC wnl. SCr 1.05 with est CrCl ~93mL/min- stable  A vancomycin trough drawn this afternoon was low at 7.6- no doses were missed.  Vanc 1/29>> Cefepime 1/29 >>2/1 Zosyn 2/1>>  1/29 BCx: NGTD 1/29 RCx: no result S pneumo neg legionella neg  Goal of Therapy:  Vancomycin trough level 15-20 mcg/ml  Plan:  -increase Vancomycin to 1000mg  IV Q24h- will give an extra 500mg  IV x1 today as I instructed RN to hang the 500mg  dose that was due now so as not to delay therapy -Zosyn 3.375g IV q8h EI -[name] renal fxn, C&S, clinical status, LOT  Eschol Auxier D. Daiquan Resnik, PharmD, BCPS  Clinical Pharmacist Pager: [phone] 04/13/2014 1:21 PM

## 2014-04-13 NOTE — Evaluation (Signed)
Occupational Therapy Evaluation Patient Details Name: Lindsey Gomez MRN: [id number] DOB: May 11, 1945 Today's Date: 04/13/2014    History of Present Illness This 69 y.o. female admitted HCAP; AKF; DM; Thrombocytopenia; iron deficiency anemia.  PMH includes:  uncontrolled DM; chronic pancreatitis s/p partial pancreatectomy; chronic migraines; GERD; Fibromyalgia; h/o breast CA; essential HTN; COPD with ongoing tobacco use; mild memory impairment; Depression; osteoporosis; chronic fatigue; anxiety; h/o seizures   Clinical Impression    Pt admitted with above. She demonstrates the below listed deficits and will benefit from continued OT to maximize safety and independence with BADLs.  Pt presents to OT with generalized weakness, impaired balance, and h/o cognitive impairment.  Currently, she requires min - mod A for BADLs.  She reports she has a caregiver to provide 24 hour supervision/assist at discharge.  Feel she would benefit from CIR to allow her to return home.     Follow Up Recommendations  CIR;Supervision/Assistance - 24 hour    Equipment Recommendations  None recommended by OT    Recommendations for Other Services       Precautions / Restrictions Restrictions Weight Bearing Restrictions: No      Mobility Bed Mobility Overal bed mobility: Needs Assistance Bed Mobility: Sit to Supine       Sit to supine: Min guard   General bed mobility comments: Pt able to move herself back into the bed and scoot herself toward the head of the bed with min guard assist and step by step cues   Transfers Overall transfer level: Needs assistance Equipment used: 1 person hand held assist Transfers: Sit to/from Stand;Stand Pivot Transfers Sit to Stand: Min assist Stand pivot transfers: Min assist       General transfer comment: Pt requires min A to steady     Balance Overall balance assessment: Needs assistance Sitting-balance support: Feet supported Sitting balance-Leahy Scale: Fair     Standing balance support: During functional activity;Single extremity supported Standing balance-Leahy Scale: Poor                              ADL Overall ADL's : Needs assistance/impaired Eating/Feeding: Set up;Sitting   Grooming: Wash/dry hands;Wash/dry face;Oral care;Brushing hair;Set up;Sitting   Upper Body Bathing: Set up;Sitting   Lower Body Bathing: Moderate assistance;Sit to/from stand Lower Body Bathing Details (indicate cue type and reason): assist for balance and for peri area Upper Body Dressing : Set up;Sitting;Supervision/safety   Lower Body Dressing: Moderate assistance;Sit to/from stand Lower Body Dressing Details (indicate cue type and reason): Pt able to don/doff socks with min A Toilet Transfer: Minimal assistance;Ambulation;BSC Toilet Transfer Details (indicate cue type and reason): Pt incontinent of stool with no awareness.  She was able to ambulate to [facility] with +1 HHA/min A  Toileting- Clothing Manipulation and Hygiene: Maximal assistance;Sit to/from stand       Functional mobility during ADLs: Minimal assistance General ADL Comments: Pt requires encouragement for full participation due to fatigue this am.  She reports she did not sleep well     Vision                     Perception     Praxis      Pertinent Vitals/Pain Pain Assessment: 0-10 Faces Pain Scale: No hurt     Hand Dominance Right   Extremity/Trunk Assessment Upper Extremity Assessment Upper Extremity Assessment: Generalized weakness   Lower Extremity Assessment Lower Extremity Assessment: Defer to PT evaluation  Cervical / Trunk Assessment Cervical / Trunk Assessment: Kyphotic   Communication Communication Communication: No difficulties   Cognition Arousal/Alertness: Awake/alert Behavior During Therapy: Flat affect Overall Cognitive Status: No family/caregiver present to determine baseline cognitive functioning                     General  Comments       Exercises       Shoulder Instructions      Home Living Family/patient expects to be discharged to:: Private residence Living Arrangements: Spouse/significant other Available Help at Discharge: Family;Personal care attendant;Available 24 hours/day Type of Home: House Home Access: Level entry     Home Layout: One level     Bathroom Shower/Tub: Tub/shower unit;Curtain Shower/tub characteristics: Architectural technologist: Standard     Home Equipment: Environmental consultant - 2 wheels;Bedside commode;Shower seat;Grab bars - tub/shower;Hand held shower head;Cane - single point   Additional Comments: Pt reports she has a PCA 24/7 who also assist spouse who requires assist for BADLs       Prior Functioning/Environment Level of Independence: Needs assistance  Gait / Transfers Assistance Needed: Pt ambulating (I) PTA however history of falls.  ADL's / Homemaking Assistance Needed: Pt states that aide was assisting with washing her back and supervision for bathing, and that she occasionally needed assist to dress.        OT Diagnosis: Generalized weakness;Cognitive deficits   OT Problem List: Decreased strength;Decreased activity tolerance;Impaired balance (sitting and/or standing);Decreased cognition;Decreased safety awareness   OT Treatment/Interventions: Self-care/ADL training;DME and/or AE instruction;Therapeutic activities;Cognitive remediation/compensation;Patient/family education;Balance training    OT Goals(Current goals can be found in the care plan section) Acute Rehab OT Goals Patient Stated Goal: To rest  OT Goal Formulation: With patient Time For Goal Achievement: 04/27/14 Potential to Achieve Goals: Good ADL Goals Pt Will Perform Grooming: with supervision;standing Pt Will Perform Lower Body Bathing: with min guard assist;sit to/from stand Pt Will Perform Lower Body Dressing: with min guard assist;sit to/from stand Pt Will Transfer to Toilet: with min guard  assist;ambulating;bedside commode;grab bars;regular height toilet Pt Will Perform Toileting - Clothing Manipulation and hygiene: with min guard assist;sit to/from stand  OT Frequency: Min 2X/week   Barriers to D/C:            Co-evaluation              End of Session Nurse Communication: Mobility status  Activity Tolerance: Patient limited by fatigue Patient left: in bed;with call bell/phone within reach;with bed alarm set   Time: 2330-0762 OT Time Calculation (min): 24 min Charges:  OT General Charges $OT Visit: 1 Procedure OT Evaluation $Initial OT Evaluation Tier I: 1 Procedure OT Treatments $Self Care/Home Management : 8-22 mins G-Codes:    Jalin Erpelding M 05-03-14, 11:41 AM

## 2014-04-13 NOTE — Progress Notes (Signed)
Physical Therapy Treatment Patient Details Name: Lindsey Gomez MRN: [id number] DOB: 07/29/1945 Today's Date: 04/13/2014    History of Present Illness This 69 y.o. female admitted HCAP; AKF; DM; Thrombocytopenia; iron deficiency anemia.  PMH includes:  uncontrolled DM; chronic pancreatitis s/p partial pancreatectomy; chronic migraines; GERD; Fibromyalgia; h/o breast CA; essential HTN; COPD with ongoing tobacco use; mild memory impairment; Depression; osteoporosis; chronic fatigue; anxiety; h/o seizures    PT Comments    Pt. Needed max encouragement to get OOB. Pt. Reports being cold and needing to sleep. Nurse tech reported pt. Having loose stool so decided to go to chair and do minimal walking. Pt. Would benefit from gait training and bed mobility next session to gain independence.   Follow Up Recommendations  Supervision/Assistance - 24 hour     Equipment Recommendations  Rolling walker with 5" wheels;3in1 (PT)    Recommendations for Other Services OT consult     Precautions / Restrictions Precautions Precautions: Fall Restrictions Weight Bearing Restrictions: No    Mobility  Bed Mobility Overal bed mobility: Needs Assistance Bed Mobility: Supine to Sit     Supine to sit: Min assist Sit to supine: Min guard   General bed mobility comments: pt. needed min assist for LEs and to scoot out hips to edge of bed.  Transfers Overall transfer level: Needs assistance Equipment used: 1 person hand held assist Transfers: Stand Pivot Transfers;Sit to/from Stand Sit to Stand: Min assist Stand pivot transfers: Min assist       General transfer comment: Pt requires min A to steady Pt. tends to grab on to Therapist arms for stability  Ambulation/Gait Ambulation/Gait assistance: Min assist Ambulation Distance (Feet): 10 Feet Assistive device: 1 person hand held assist Gait Pattern/deviations: Shuffle;Ataxic;Decreased stride length Gait velocity: decreased Gait velocity  interpretation: Below normal speed for age/gender General Gait Details: small, ataxic almost choreo-like steps   Stairs            Wheelchair Mobility    Modified Rankin (Stroke Patients Only)       Balance Overall balance assessment: Needs assistance Sitting-balance support: Feet supported Sitting balance-Leahy Scale: Fair     Standing balance support: During functional activity;Single extremity supported Standing balance-Leahy Scale: Poor                      Cognition Arousal/Alertness: Awake/alert Behavior During Therapy: Flat affect Overall Cognitive Status: No family/caregiver present to determine baseline cognitive functioning                      Exercises      General Comments        Pertinent Vitals/Pain Pain Assessment: No/denies pain Faces Pain Scale: No hurt    Home Living Family/patient expects to be discharged to:: Private residence Living Arrangements: Spouse/significant other Available Help at Discharge: Family;Personal care attendant;Available 24 hours/day Type of Home: House Home Access: Level entry   Home Layout: One level Home Equipment: Walker - 2 wheels;Bedside commode;Shower seat;Grab bars - tub/shower;Hand held shower head;Cane - single point Additional Comments: Pt reports she has a PCA 24/7 who also assist spouse who requires assist for BADLs     Prior Function Level of Independence: Needs assistance  Gait / Transfers Assistance Needed: Pt ambulating (I) PTA however history of falls.  ADL's / Homemaking Assistance Needed: Pt states that aide was assisting with washing her back and supervision for bathing, and that she occasionally needed assist to dress.  PT Goals (current goals can now be found in the care plan section) Acute Rehab PT Goals Patient Stated Goal: To rest  Progress towards PT goals: Progressing toward goals    Frequency  Min 3X/week    PT Plan Current plan remains appropriate     Co-evaluation             End of Session Equipment Utilized During Treatment: Gait belt Activity Tolerance: Patient limited by fatigue Patient left: in bed;with bed alarm set;with call bell/phone within reach     Time: 1145-1210 PT Time Calculation (min) (ACUTE ONLY): 25 min  Charges:                       G Codes:      Jodi Geralds, SPTA 04/13/2014, 1:00 PM

## 2014-04-14 LAB — BASIC METABOLIC PANEL
Anion gap: 4 — ABNORMAL LOW (ref 5–15)
BUN: 10 mg/dL (ref 6–23)
CALCIUM: 8.5 mg/dL (ref 8.4–10.5)
CO2: 20 mmol/L (ref 19–32)
CREATININE: 0.88 mg/dL (ref 0.50–1.10)
Chloride: 117 mmol/L — ABNORMAL HIGH (ref 96–112)
GFR calc Af Amer: 78 mL/min — ABNORMAL LOW (ref >90)
GFR calc non Af Amer: 67 mL/min — ABNORMAL LOW (ref >90)
GLUCOSE: 61 mg/dL — AB (ref 70–99)
Potassium: 3 mmol/L — ABNORMAL LOW (ref 3.5–5.1)
SODIUM: 141 mmol/L (ref 135–145)

## 2014-04-14 LAB — GLUCOSE, CAPILLARY
GLUCOSE-CAPILLARY: 110 mg/dL — AB (ref 70–99)
GLUCOSE-CAPILLARY: 104 mg/dL — AB (ref 70–99)
GLUCOSE-CAPILLARY: 106 mg/dL — AB (ref 70–99)
Glucose-Capillary: 36 mg/dL — CL (ref 70–99)
Glucose-Capillary: 348 mg/dL — ABNORMAL HIGH (ref 70–99)

## 2014-04-14 LAB — CBC
HEMATOCRIT: 25.6 % — AB (ref 36.0–46.0)
Hemoglobin: 8.7 g/dL — ABNORMAL LOW (ref 12.0–15.0)
MCH: 30.4 pg (ref 26.0–34.0)
MCHC: 34 g/dL (ref 30.0–36.0)
MCV: 89.5 fL (ref 78.0–100.0)
PLATELETS: 133 10*3/uL — AB (ref 150–400)
RBC: 2.86 MIL/uL — AB (ref 3.87–5.11)
RDW: 16.1 % — AB (ref 11.5–15.5)
WBC: 5.5 10*3/uL (ref 4.0–10.5)

## 2014-04-14 LAB — MAGNESIUM: MAGNESIUM: 1.8 mg/dL (ref 1.5–2.5)

## 2014-04-14 MED ORDER — CALCITRIOL 0.5 MCG PO CAPS
1.0000 ug | ORAL_CAPSULE | Freq: Every day | ORAL | Status: DC
  Administered 2014-04-14 – 2014-04-15 (×2): 1 ug via ORAL
  Filled 2014-04-14 (×2): qty 2

## 2014-04-14 MED ORDER — POTASSIUM CHLORIDE 10 MEQ/100ML IV SOLN
10.0000 meq | INTRAVENOUS | Status: AC
  Administered 2014-04-14 (×2): 10 meq via INTRAVENOUS
  Filled 2014-04-14 (×2): qty 100

## 2014-04-14 MED ORDER — LORAZEPAM 1 MG PO TABS
1.0000 mg | ORAL_TABLET | Freq: Every day | ORAL | Status: DC
  Administered 2014-04-14: 1 mg via ORAL
  Filled 2014-04-14: qty 1

## 2014-04-14 MED ORDER — ENSURE COMPLETE PO LIQD
237.0000 mL | Freq: Three times a day (TID) | ORAL | Status: DC
  Administered 2014-04-14 – 2014-04-15 (×3): 237 mL via ORAL

## 2014-04-14 MED ORDER — AMOXICILLIN-POT CLAVULANATE 400-57 MG/5ML PO SUSR
875.0000 mg | Freq: Two times a day (BID) | ORAL | Status: DC
  Administered 2014-04-14 – 2014-04-15 (×2): 872 mg via ORAL
  Filled 2014-04-14 (×3): qty 10.9

## 2014-04-14 MED ORDER — POTASSIUM CHLORIDE 10 MEQ/100ML IV SOLN
10.0000 meq | INTRAVENOUS | Status: AC
  Filled 2014-04-14: qty 100

## 2014-04-14 MED ORDER — LOPERAMIDE HCL 2 MG PO CAPS: 2.0000 mg | ORAL_CAPSULE | ORAL | Status: DC | PRN

## 2014-04-14 NOTE — Progress Notes (Signed)
INITIAL NUTRITION ASSESSMENT  DOCUMENTATION CODES Per approved criteria  -Severe malnutrition in the context of chronic illness -Underweight   Pt meets criteria for severe MALNUTRITION In the context of chronic illness as evidenced by severe depletion of fat and muscle mass.   INTERVENTION: -Ensure Complete TID (thickened to appropriate consistency) providing 250 kcal and 13 g protein  -Continue to monitor PO intake  NUTRITION DIAGNOSIS: Inadequate oral intake related to poor appetite as evidenced by 0-15% PO intake and pt report.   Goal: Pt to meet >/=90% of estimated needs through meals and supplements  Monitor:  PO intake, weight trends, labs  Reason for Assessment: Consult per MD  69 y.o. female  Admitting Dx: HCAP (healthcare-associated pneumonia)  ASSESSMENT: Pt admitted with weakness and productive cough for past 2-3 days and a fever of 103.4 degrees F.  Pt hx of uncontrolled T2DM, chronic pancreatitis, GERD, fibromyalgia, COPD, HTN.  Currently on DYS 2 CHO Mod diet with nectar thick liquids and eating 0-10%.  Pt reports she is just not hungry.  Pt has not noticed weight loss and says she was eating fine at home before she came into the hospital. [facility] records show a 14% loss of body weight in the past month (significant for time frame) but this value seems to be an outlier. Pt reports she loves chocolate ensure and drinks 3 a day at home, wants to continue 3 a day here in the hospital.   Nutrition Focused Physical Exam:  Subcutaneous Fat:  Orbital Region: severe depletion Upper Arm Region: moderate depletion Thoracic and Lumbar Region: severe depletion  Muscle:  Temple Region: severe depletion Clavicle Bone Region: severe depletion Clavicle and Acromion Bone Region: severe depletion Scapular Bone Region: n/a Dorsal Hand: severe depletion  Patellar Region: severe depletion Anterior Thigh Region: severe depletion Posterior Calf Region: severe depeltion  Edema: not  present    Height: Ht Readings from Last 1 Encounters:  04/09/14 5\' 2"  (1.575 m)    Weight: Wt Readings from Last 1 Encounters:  04/09/14 86 lb 6.7 oz (39.2 kg)    Ideal Body Weight: 110 lbs  % Ideal Body Weight: 78%  Wt Readings from Last 10 Encounters:  04/09/14 86 lb 6.7 oz (39.2 kg)  03/18/14 91 lb 6.4 oz (41.459 kg)  03/14/14 100 lb 5 oz (45.5 kg)  01/28/14 86 lb 2 oz (39.066 kg)  01/14/14 91 lb 2 oz (41.334 kg)  12/31/13 89 lb 6 oz (40.54 kg)  12/30/13 89 lb 6 oz (40.54 kg)  11/05/13 93 lb 2 oz (42.241 kg)  10/16/13 90 lb (40.824 kg)  10/12/13 90 lb (40.824 kg)    Usual Body Weight: 88 lbs  % Usual Body Weight: 97%  BMI:  Body mass index is 15.8 kg/(m^2).  Estimated Nutritional Needs: Kcal: 1400-1600 kcal Protein: 60-75 g protein Fluid: >/= 1.4 L  Skin: Dry, Non-tenting  Diet Order: DIET DYS 2  EDUCATION NEEDS: -No education needs identified at this time  No intake or output data in the 24 hours ending 04/14/14 1339  Last BM: 2/2   Labs:   Recent Labs Lab 04/12/14 0524 04/13/14 0521 04/14/14 0514  NA 136 141 141  K 2.8* 3.6 3.0*  CL 113* 118* 117*  CO2 15* 16* 20  BUN 16 14 10   CREATININE 0.93 1.05 0.88  CALCIUM 8.6 8.7 8.5  GLUCOSE 46* 75 61*    CBG (last 3)   Recent Labs  04/14/14 0825 04/14/14 0933 04/14/14 1156  GLUCAP 36*  110* 104*    Scheduled Meds: . amLODipine  10 mg Oral Daily  . atenolol  100 mg Oral Daily  . atorvastatin  10 mg Oral q1800  . dicyclomine  10 mg Oral TID AC  . ferrous sulfate  325 mg Oral Q breakfast  . fluticasone  1 spray Each Nare Daily  . guaiFENesin  600 mg Oral BID  . hydrocortisone  10 mg Oral Daily  . insulin aspart  0-15 Units Subcutaneous TID WC  . insulin aspart  0-5 Units Subcutaneous QHS  . insulin detemir  8 Units Subcutaneous QHS  . LORazepam  1 mg Oral BID  . memantine  7 mg Oral BID  . nicotine  7 mg Transdermal Daily  . ondansetron  8 mg Oral BID  . pantoprazole  40 mg  Oral Daily  . piperacillin-tazobactam (ZOSYN)  IV  3.375 g Intravenous 3 times per day  . pregabalin  25 mg Oral BID  . saccharomyces boulardii  250 mg Oral BID  . sertraline  25 mg Oral Daily  . sodium bicarbonate  650 mg Oral BID  . topiramate  25 mg Oral BID  . vancomycin  1,000 mg Intravenous Q24H    Continuous Infusions: . sodium chloride 0.9 % 1,000 mL with potassium chloride 40 mEq infusion 75 mL/hr at 04/13/14 1856    Past Medical History  Diagnosis Date  . Depression   . HTN (hypertension)   . Chronic pain   . Fibromyalgia   . Migraines   . History of breast cancer     Right  . Chronic pancreatitis   . Vitamin D deficiency   . GERD (gastroesophageal reflux disease)   . Finger dislocation 2009    L 3rd  . COPD (chronic obstructive pulmonary disease)   . Anemia   . Fractured sternum 11/2008  . Osteoporosis     Reclast too expensive  . Barrett's esophagus   . Chronic fatigue   . Diverticulosis   . Opioid dependence   . 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 Abnormality of gait 10/12/2013  . Anxiety   . Depression   . Convulsions/seizures 10/12/2013    Possible benzodiazepine withdrawal seizure, x1-no further seizure activity   . Cancer     HX OF BREAST CANCER -chemo, radiation  . Type II or unspecified type diabetes mellitus without mention of complication, not stated as uncontrolled     Past Surgical History  Procedure Laterality Date  . Nasal sinus surgery    . Pancreatectomy      40%  . Cholecystectomy    . Appendectomy    . Abdominal hysterectomy    . Lobectomy Left 2005    granulomatous lesion.   . Breast lumpectomy Left   . Ercp N/A 10/05/2013    Procedure: ENDOSCOPIC RETROGRADE CHOLANGIOPANCREATOGRAPHY (ERCP);  Surgeon: Ladene Artist, MD;  Location: [facility] ENDOSCOPY;  Service: Endoscopy;  Laterality: N/A;  . Cataract extraction Bilateral   . Botox injection      recent botox injection 12-23-13 injection for Migraines  . Cataract extraction, bilateral  Bilateral     bilateral  . Eye surgery      1 eye "membrane surgery"  . Ercp N/A 01/07/2014    Procedure: ENDOSCOPIC RETROGRADE CHOLANGIOPANCREATOGRAPHY (ERCP);  Surgeon: Inda Castle, MD;  Location: [facility] ENDOSCOPY;  Service: Endoscopy;  Laterality: N/A;  . Spyglass cholangioscopy N/A 01/07/2014    Procedure: YQMVHQIO CHOLANGIOSCOPY;  Surgeon: Inda Castle, MD;  Location: [facility] ENDOSCOPY;  Service:  Endoscopy;  Laterality: N/A;    Elmer Picker MS Dietetic Intern Pager Number [phone]

## 2014-04-14 NOTE — Progress Notes (Signed)
[facility] PROGRESS NOTE  Lindsey Gomez KWI:[id number] DOB: 1945/05/21 DOA: 04/09/2014 PCP: Mathews Argyle, MD  Brief narrative 69 year old female with uncontrolled type 2 diabetes mellitus, chronic pancreatitis status post partial pancreatectomy, chronic migraine headaches, GERD, fibromyalgia history of right-sided breast cancer, essential hypertension, COPD (quit smoking 2 months back) and mild memory impairment who was recently hospitalized for nonketotic hyperosmolar state and acute kidney injury the was brought to the ED with fever of 103.24F at home associated with productive cough with greenish sputum for the past 2-3 days and some dyspnea on exertion and generalized fatigue. Patient admitted with healthcare associated pneumonia.  Assessment/Plan: Principal Problem:  HCAP (healthcare-associated pneumonia) vs aspiration pneumonia Change to po abx, augmentin.   Active Problems:  Acute renal failure Likely secondary to dehydration. Resolved with IV fluids. Avoid nephrotoxins.  Urinary retention Noted for about 500 mL urine retained in bladder scan overnight. Renal ultrasound shows possible medical renal disease.  Foley catheter placed for 24 hours and removed. Has been able to void without difficulty.  Hypokalemia Returns. IV kcl repletion. Check mag   DM2 (diabetes mellitus, type 2) controlled   Thrombocytopenia Likely secondary to acute illness. Slowly improving   Anxiety state Change ativan to qhs. Sleepy during day    Essential hypertension Blood pressure stable. 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 Stent was removed in October 2015.[name] Sees Dr Deatra Ina.   Metabolic acidosis resolved   GERD (gastroesophageal reflux disease) and Barrett's esophagus Continue PPI  Chronic migraine On multiple medications which  have been resumed. Follows with outpatient neurology   Abnormality of gait Follows with neurology as outpatient. Previous imaging showing age  disproportionate mild generalized cerebral atrophy. CT head repeated given urinary incontinence and unsteady gait and no new findings.   Severe protein-calorie malnutrition Nutrition consult   Memory impairment Mild. Continue home medications.  Diarrhea: c diff neg. Prn imodium  COPD and ongoing tobacco use Continue home inhalers. Add prn albuterol nebs. Continue nicotine patch.   Iron def anemia Continue iron sulfate  Patient is also on daily Solu Cortef 10 mg daily. Not clear why she is on it. Need to verify with her PCP  Dysphagia: on D2 nectar  Deconditioning: insurance has denied coverage for CIR. Patient refuses SNF. Home tomorrow with home therapies if stable  DVT prophylaxis: SCD  Diet: Dysphagia level II nectar thick  Consultants:  None  Procedures:  None  Antibiotics:  IV vancomycin 1/29-- 2/3  IV cefepime 1/29-2/1  IV zosyn 2/1-- 2/3  augmentin 2/3  HPI/Subjective: Per rn, loose stool.  Patient c/o "sleepy" no other complaints  Objective: Filed Vitals:   04/14/14 0502  BP: 132/72  Pulse: 88  Temp: 98.6 F (37 C)  Resp: 16   No intake or output data in the 24 hours ending 04/14/14 1410 Filed Weights   04/09/14 1006 04/09/14 1259  Weight: 39.009 kg (86 lb) 39.2 kg (86 lb 6.7 oz)    Exam:   General:  Sleepy. Arousable. Slow to respond  HEENT: slightly dry MMM  Chest: CTA without WRR  Gastrointestinal: Soft, nondistended, nontender  Musculoskeletal: Warm, no edema,  Data Reviewed: Basic Metabolic Panel:  Recent Labs Lab 04/10/14 0425 04/11/14 0525 04/12/14 0524 04/13/14 0521 04/14/14 0514  NA 138 137 136 141 141  K 2.9* 3.4* 2.8* 3.6 3.0*  CL 112 113* 113* 118* 117*  CO2 19 18* 15*  16* 20  GLUCOSE 147* 203* 46* 75 61*  BUN 22 20 16 14 10   CREATININE 1.14* 1.04 0.93 1.05  0.88  CALCIUM 9.2 8.8 8.6 8.7 8.5   Liver Function Tests: No results for input(s): AST, ALT, ALKPHOS, BILITOT, PROT, ALBUMIN in the last 168 hours. No results for input(s): LIPASE, AMYLASE in the last 168 hours. No results for input(s): AMMONIA in the last 168 hours. CBC:  Recent Labs Lab 04/09/14 1054 04/10/14 0425 04/11/14 0525 04/12/14 0524 04/13/14 0521 04/14/14 0514  WBC 5.3 8.5 7.2 8.1 5.6 5.5  NEUTROABS 3.9  --   --   --   --   --   HGB 13.8 10.0* 9.2* 9.7* 8.8* 8.7*  HCT 40.8 29.5* 27.3* 28.1* 26.0* 25.6*  MCV 92.3 92.2 90.7 89.5 90.9 89.5  PLT 59* 87* 81* 116* 104* 133*   Cardiac Enzymes: No results for input(s): CKTOTAL, CKMB, CKMBINDEX, TROPONINI in the last 168 hours. BNP (last 3 results) No results for input(s): PROBNP in the last 8760 hours. CBG:  Recent Labs Lab 04/13/14 1645 04/13/14 2151 04/14/14 0825 04/14/14 0933 04/14/14 1156  GLUCAP 145* 128* 36* 110* 104*    Recent Results (from the past 240 hour(s))  Blood culture (routine x 2)     Status: None (Preliminary result)   Collection Time: 04/09/14 10:55 AM  Result Value Ref Range Status   Specimen Description BLOOD LEFT ARM  Final   Special Requests BOTTLES DRAWN AEROBIC AND ANAEROBIC 5CC  Final   Culture   Final           BLOOD CULTURE RECEIVED NO GROWTH TO DATE CULTURE WILL BE HELD FOR 5 DAYS BEFORE ISSUING A FINAL NEGATIVE REPORT Performed at [facility]    Report Status PENDING  Incomplete  Blood culture (routine x 2)     Status: None (Preliminary result)   Collection Time: 04/09/14 11:00 AM  Result Value Ref Range Status   Specimen Description BLOOD RIGHT ANTECUBITAL  Final   Special Requests BOTTLES DRAWN AEROBIC AND ANAEROBIC 5CC  Final   Culture   Final           BLOOD CULTURE RECEIVED NO GROWTH TO DATE CULTURE WILL BE HELD FOR 5 DAYS BEFORE ISSUING A FINAL NEGATIVE REPORT Performed at [facility]    Report Status PENDING  Incomplete  Clostridium Difficile by PCR      Status: None   Collection Time: 04/12/14 12:37 PM  Result Value Ref Range Status   C difficile by pcr NEGATIVE NEGATIVE Final     Studies: No results found.  Scheduled Meds: . amLODipine  10 mg Oral Daily  . amoxicillin-clavulanate  875 mg of amoxicillin Oral BID  . atenolol  100 mg Oral Daily  . atorvastatin  10 mg Oral q1800  . dicyclomine  10 mg Oral TID AC  . ferrous sulfate  325 mg Oral Q breakfast  . fluticasone  1 spray Each Nare Daily  . guaiFENesin  600 mg Oral BID  . hydrocortisone  10 mg Oral Daily  . insulin aspart  0-15 Units Subcutaneous TID WC  . insulin aspart  0-5 Units Subcutaneous QHS  . insulin detemir  8 Units Subcutaneous QHS  . [START ON 04/15/2014] LORazepam  1 mg Oral QHS  . memantine  7 mg Oral BID  . nicotine  7 mg Transdermal Daily  . ondansetron  8 mg Oral BID  . pantoprazole  40 mg Oral Daily  . potassium  chloride  10 mEq Intravenous Q1 Hr x 4  . pregabalin  25 mg Oral BID  . saccharomyces boulardii  250 mg Oral BID  . sertraline  25 mg Oral Daily  . sodium bicarbonate  650 mg Oral BID  . topiramate  25 mg Oral BID   Continuous Infusions: . sodium chloride 0.9 % 1,000 mL with potassium chloride 40 mEq infusion 75 mL/hr at 04/13/14 1856   Time spent: 25 minutes  [facility] [email or web address], password [facility] 04/14/2014, 2:10 PM  LOS: 5 days

## 2014-04-14 NOTE — Progress Notes (Signed)
Physical Therapy Treatment Patient Details Name: Lindsey Gomez MRN: [id number] DOB: March 24, 1945 Today's Date: 04/14/2014    History of Present Illness This 69 y.o. female admitted HCAP; AKF; DM; Thrombocytopenia; iron deficiency anemia.  PMH includes:  uncontrolled DM; chronic pancreatitis s/p partial pancreatectomy; chronic migraines; GERD; Fibromyalgia; h/o breast CA; essential HTN; COPD with ongoing tobacco use; mild memory impairment; Depression; osteoporosis; chronic fatigue; anxiety; h/o seizures    PT Comments    Pt. Needs 24 hr care to prevent falls because of lack of static and dynamic balance which is in place for d/c tomorrow. Needed max assist to prevent falls today while walking.  Follow Up Recommendations  Supervision/Assistance - 24 hour     Equipment Recommendations  Rolling walker with 5" wheels;3in1 (PT)    Recommendations for Other Services OT consult     Precautions / Restrictions Precautions Precautions: Fall Restrictions Weight Bearing Restrictions: No    Mobility  Bed Mobility Overal bed mobility: Needs Assistance Bed Mobility: Supine to Sit;Sit to Supine     Supine to sit: Min assist Sit to supine: Min assist   General bed mobility comments: needs assist to lift trunk  Transfers Overall transfer level: Needs assistance Equipment used: 1 person hand held assist Transfers: Sit to/from Stand Sit to Stand: Min assist Stand pivot transfers: Mod assist       General transfer comment: Pt leaning posteriorly, requires assist to maintain balance.   Ambulation/Gait Ambulation/Gait assistance: Max assist Ambulation Distance (Feet): 20 Feet Assistive device: 1 person hand held assist;Rolling walker (2 wheeled) Gait Pattern/deviations: Shuffle;Decreased stride length;Ataxic Gait velocity: decreased Gait velocity interpretation: Below normal speed for age/gender General Gait Details: small, ataxic almost choreo-like steps, did not self correct when she  felt like she was falling; education given about stepping strategy; max assist to prevent falls.   Stairs            Wheelchair Mobility    Modified Rankin (Stroke Patients Only)       Balance Overall balance assessment: Needs assistance Sitting-balance support: Feet supported Sitting balance-Leahy Scale: Poor Sitting balance - Comments: could not handle balnce challenges limited trunk control to maintain upright posture Postural control: Posterior lean;Right lateral lean Standing balance support: During functional activity Standing balance-Leahy Scale: Poor Standing balance comment: Heavy posterior and right lean                    Cognition Arousal/Alertness: Awake/alert Behavior During Therapy: Flat affect Overall Cognitive Status: No family/caregiver present to determine baseline cognitive functioning                      Exercises Other Exercises Other Exercises: Pt performed 2 sets 10 reps shoulder flexion EOB    General Comments        Pertinent Vitals/Pain Pain Assessment: 0-10 Faces Pain Scale: Hurts whole lot Pain Location: Headache Pain Descriptors / Indicators: Aching Pain Intervention(s): Monitored during session;Patient requesting pain meds-RN notified    Home Living                      Prior Function            PT Goals (current goals can now be found in the care plan section) Progress towards PT goals: Progressing toward goals    Frequency  Min 3X/week    PT Plan Current plan remains appropriate    Co-evaluation  End of Session Equipment Utilized During Treatment: Gait belt Activity Tolerance: Patient tolerated treatment well Patient left: in bed;with call bell/phone within reach;with bed alarm set     Time: 1400-1420 PT Time Calculation (min) (ACUTE ONLY): 20 min  Charges:                       G Codes:      Jodi Geralds, SPTA 04/14/2014, 2:28 PM

## 2014-04-14 NOTE — Progress Notes (Signed)
Occupational Therapy Treatment Patient Details Name: Lindsey Gomez MRN: [id number] DOB: 1946/02/11 Today's Date: 04/14/2014    History of present illness This 69 y.o. female admitted HCAP; AKF; DM; Thrombocytopenia; iron deficiency anemia.  PMH includes:  uncontrolled DM; chronic pancreatitis s/p partial pancreatectomy; chronic migraines; GERD; Fibromyalgia; h/o breast CA; essential HTN; COPD with ongoing tobacco use; mild memory impairment; Depression; osteoporosis; chronic fatigue; anxiety; h/o seizures   OT comments  Pt requires max encouragement to participate.  She requires increased assist for balance and functional mobility today while performing grooming tasks and toilet transfer due to posterior lean - requires mod A.  Pt repeatedly states she is tired.  Written instruction left in room for caregivers to encourage her to perform as much of her ADLs as possible, and to perform UE exercises 3x/day.   Recommend HHOT  Follow Up Recommendations  Home health OT;Supervision/Assistance - 24 hour    Equipment Recommendations  None recommended by OT    Recommendations for Other Services      Precautions / Restrictions Precautions Precautions: Fall       Mobility Bed Mobility Overal bed mobility: Needs Assistance Bed Mobility: Supine to Sit;Sit to Supine     Supine to sit: Min assist Sit to supine: Min guard   General bed mobility comments: Needs assist to lift trunk   Transfers Overall transfer level: Needs assistance Equipment used: 1 person hand held assist Transfers: Sit to/from [employer] Transfers Sit to Stand: Mod assist Stand pivot transfers: Mod assist       General transfer comment: Pt leaning posteriorly, requires assist to maintain balance.     Balance Overall balance assessment: Needs assistance Sitting-balance support: Feet supported Sitting balance-Leahy Scale: Fair     Standing balance support: During functional activity Standing balance-Leahy  Scale: Poor                     ADL       Grooming: Brushing hair;Wash/dry hands;Minimal assistance;Standing                   Toilet Transfer: Moderate assistance;Ambulation;Comfort height toilet;Grab bars Toilet Transfer Details (indicate cue type and reason): Pt leaning posteriorly Toileting- Clothing Manipulation and Hygiene: Moderate assistance;Sit to/from stand         General ADL Comments: Pt required max cues to participate - states she just wants to sleep.  Pt loses balance  posteriorly.   Established activity list for pt to perform at home including grooming standing at sink, as much of her bathing and dressing as she can do, and bil. UE exericses.   Pt nods in agreement.  Written instruction also left that she needs assist anytime she stands or ambulates due to impaired balance.       Vision                     Perception     Praxis      Cognition   Behavior During Therapy: Flat affect Overall Cognitive Status: No family/caregiver present to determine baseline cognitive functioning                       Extremity/Trunk Assessment               Exercises Other Exercises Other Exercises: Pt performed 2 sets 10 reps shoulder flexion EOB   Shoulder Instructions       General Comments      Pertinent Vitals/  Pain       Pain Assessment: No/denies pain  Home Living                                          Prior Functioning/Environment              Frequency Min 2X/week     Progress Toward Goals  OT Goals(current goals can now be found in the care plan section)  Progress towards OT goals: Progressing toward goals  ADL Goals Pt Will Perform Grooming: with supervision;standing Pt Will Perform Lower Body Bathing: with min guard assist;sit to/from stand Pt Will Perform Lower Body Dressing: with min guard assist;sit to/from stand Pt Will Transfer to Toilet: with min guard assist;ambulating;bedside  commode;grab bars;regular height toilet Pt Will Perform Toileting - Clothing Manipulation and hygiene: with min guard assist;sit to/from stand  Plan Discharge plan needs to be updated    Co-evaluation                 End of Session     Activity Tolerance Patient limited by fatigue   Patient Left with call bell/phone within reach;with bed alarm set;in bed   Nurse Communication Mobility status        Time: 1771-1657 OT Time Calculation (min): 23 min  Charges: OT General Charges $OT Visit: 1 Procedure OT Treatments $Self Care/Home Management : 8-22 mins $Therapeutic Activity: 8-22 mins  Lindsey Gomez M 04/14/2014, 1:47 PM

## 2014-04-14 NOTE — Progress Notes (Signed)
Rehab admissions - I received a response from [facility] and they denied inpatient rehab saying that pt's needs could be met at a lower level of care at skilled nursing. I discussed pt's case with Dr. Conley Canal and Nira Conn case manager. Dr. Conley Canal also agreed that SNF would be appropriate.  I met with pt to share this update and she adamantly refused to consider going to SNF. Pt stated, "I will go home then. I have help at home."  I then called and spoke with pt's husband, her daughter and primary caregiver [place]. I explained the insurance denial and pt's preference to return home with caregiver/home health support. All were in support of pt returning home with caregiver and home health support. [name], caregiver shared that the team of caregivers has been helping her prior and is comfortable providing needed assistance now.   [name] also requested that specific DC recommendations for activity/encouragement to stay active be written down per therapy/MD as pt "has a tendency to not want to do anything at home." [name] stated the caregiver team could better encourage pt to stay active with these recommendations written down. I discussed this with Nira Conn, case manager who will update Dr. Conley Canal.  I updated Heather, case Freight forwarder and Randall Hiss, Education officer, museum about pt's case. I explained that case manager would make arrangements for home health needs.  We will now sign off pt's case and recommend that pt have 24-7 care with home health follow up.   Thanks.  Nanetta Batty, PT Rehabilitation Admissions Coordinator [phone]

## 2014-04-14 NOTE — Care Management Note (Addendum)
  Page 1 of 1   04/14/2014     1:30:51 PM CARE MANAGEMENT NOTE 04/14/2014  Patient:  Lindsey Gomez, Lindsey Gomez   Account Number:  [id number]  Date Initiated:  04/09/2014  Documentation initiated by:  Sandi Mariscal  Subjective/Objective Assessment:   RUL PNA; fever, productive cough, weak, low po intake     Action/Plan:   bld cx, IV abx; pt was d/c home 1/4 w/ [facility] for PT/RN/CSW. Has 24/7 HHAide that is privately paid.   Anticipated DC Date:  04/14/2014   Anticipated DC Plan:  [place]         Choice offered to / List presented to:             Status of service:  In process, will continue to follow Medicare Important Message given?  YES (If response is "NO", the following Medicare IM given date fields will be blank) Date Medicare IM given:  04/13/2014 Medicare IM given by:  Magdalen Spatz Date Additional Medicare IM given:   Additional Medicare IM given by:    Discharge Disposition:    Per UR Regulation:  Reviewed for med. necessity/level of care/duration of stay  If discussed at [place] of Stay Meetings, dates discussed:    Comments:  04-14-14 Added speech to home health services. Magdalen Spatz RN BSN      04-14-14 HH orders entered [facility]. Husband states they have all DME ex walker , raised commode seat , and shower bench. Magdalen Spatz RN BSN   04-14-14 Insurance has denied inpatient rehab . Patient not wanting SNF . Spoke with husband , husband in agreement that patient go home with home health , he would like [facility] ( requesting [facility] ) . Will ask MD for orders . Magdalen Spatz RN BSN [phone]

## 2014-04-14 NOTE — Progress Notes (Signed)
Hypoglycemic Event  CBG: 36  Treatment: 15 GM carbohydrate snack, orange juice  Symptoms: cold  Follow-up CBG: [id number] CBG Result:110  Possible Reasons for Event: Inadequate meal intake  Comments/MD notified:yes    Elinor Parkinson, Earna Coder  Remember to initiate Hypoglycemia Order Set & complete

## 2014-04-14 NOTE — Progress Notes (Signed)
Inpatient Diabetes Program Recommendations  AACE/ADA: New Consensus Statement on Inpatient Glycemic Control (2013)  Target Ranges:  Prepandial:   less than 140 mg/dL      Peak postprandial:   less than 180 mg/dL (1-2 hours)      Critically ill patients:  140 - 180 mg/dL   Reason for Assessment:  Results for IPEK, WESTRA (MRN [id number]) as of 04/14/2014 10:40  Ref. Range 04/13/2014 08:05 04/13/2014 08:41 04/13/2014 11:45 04/13/2014 16:45 04/13/2014 21:51 04/14/2014 08:25 04/14/2014 09:33  Glucose-Capillary Latest Range: 70-99 mg/dL 41 (LL) 71 267 (H) 145 (H) 128 (H) 36 (LL) 110 (H)   Diabetes history: Diabetes type 1/ history of pancreatitis  Inpatient Diabetes Program Recommendations Insulin - Basal: Decrease Levemir to 4 units   Also may consider decreasing Novolog to sensitive.    Thanks, Adah Perl, RN, BC-ADM Inpatient Diabetes Coordinator Pager [phone]

## 2014-04-15 ENCOUNTER — Inpatient Hospital Stay: Admission: RE | Admit: 2014-04-15 | Payer: Medicare Other | Source: Ambulatory Visit

## 2014-04-15 LAB — CULTURE, BLOOD (ROUTINE X 2)
Culture: NO GROWTH
Culture: NO GROWTH

## 2014-04-15 LAB — BASIC METABOLIC PANEL
Anion gap: 6 (ref 5–15)
BUN: 13 mg/dL (ref 6–23)
CO2: 17 mmol/L — AB (ref 19–32)
CREATININE: 0.99 mg/dL (ref 0.50–1.10)
Calcium: 8.6 mg/dL (ref 8.4–10.5)
Chloride: 113 mmol/L — ABNORMAL HIGH (ref 96–112)
GFR calc Af Amer: 67 mL/min — ABNORMAL LOW (ref >90)
GFR, EST NON AFRICAN AMERICAN: 58 mL/min — AB (ref >90)
Glucose, Bld: 349 mg/dL — ABNORMAL HIGH (ref 70–99)
Potassium: 3.8 mmol/L (ref 3.5–5.1)
Sodium: 136 mmol/L (ref 135–145)

## 2014-04-15 LAB — GLUCOSE, CAPILLARY
GLUCOSE-CAPILLARY: 230 mg/dL — AB (ref 70–99)
Glucose-Capillary: 325 mg/dL — ABNORMAL HIGH (ref 70–99)

## 2014-04-15 MED ORDER — AMOXICILLIN-POT CLAVULANATE 875-125 MG PO TABS: 1.0000 | ORAL_TABLET | Freq: Two times a day (BID) | ORAL | Status: DC

## 2014-04-15 MED ORDER — LORAZEPAM 0.5 MG PO TABS
0.5000 mg | ORAL_TABLET | Freq: Once | ORAL | Status: AC | PRN
Start: 2014-04-15 — End: 2014-04-15
  Administered 2014-04-15: 0.5 mg via ORAL
  Filled 2014-04-15: qty 1

## 2014-04-15 MED ORDER — AMLODIPINE BESYLATE 10 MG PO TABS: 10.0000 mg | ORAL_TABLET | Freq: Every day | ORAL | Status: DC

## 2014-04-15 NOTE — Progress Notes (Signed)
Discussed discharge instructions with patient caregiver. Patient received Rx. Reviewed all medications with patient. Patient ready for discharge.

## 2014-04-15 NOTE — Progress Notes (Signed)
Physical Therapy Treatment Patient Details Name: Lindsey Gomez MRN: [id number] DOB: May 15, 1945 Today's Date: 04/15/2014    History of Present Illness This 69 y.o. female admitted HCAP; AKF; DM; Thrombocytopenia; iron deficiency anemia.  PMH includes:  uncontrolled DM; chronic pancreatitis s/p partial pancreatectomy; chronic migraines; GERD; Fibromyalgia; h/o breast CA; essential HTN; COPD with ongoing tobacco use; mild memory impairment; Depression; osteoporosis; chronic fatigue; anxiety; h/o seizures    PT Comments    Pt. With Improved static and dynamic balance today. Benefit from gait training to widen BOS and increase stride length to prevent falls but progress was made from last session. Pt. Will need 24 hour care upon d/c to be safe when mobile.   Follow Up Recommendations  Supervision/Assistance - 24 hour     Equipment Recommendations  Rolling walker with 5" wheels;3in1 (PT)    Recommendations for Other Services OT consult     Precautions / Restrictions Precautions Precautions: Fall Restrictions Weight Bearing Restrictions: No    Mobility  Bed Mobility Overal bed mobility: Needs Assistance Bed Mobility: Supine to Sit;Sit to Supine     Supine to sit: Min assist;HOB elevated Sit to supine: Min assist   General bed mobility comments: min A for LEs and scooting to EOB, use of handrails and HOB slightly elevated  Transfers Overall transfer level: Needs assistance Equipment used: 1 person hand held assist Transfers: Stand Pivot Transfers;Sit to/from Stand Sit to Stand: Min assist Stand pivot transfers: Min assist       General transfer comment: Min a for safety and guiding hips for stand and pivot  Ambulation/Gait Ambulation/Gait assistance: Min assist Ambulation Distance (Feet): 50 Feet Assistive device: Rolling walker (2 wheeled) Gait Pattern/deviations: Narrow base of support;Decreased stride length;Shuffle Gait velocity: decreased Gait velocity  interpretation: Below normal speed for age/gender General Gait Details: cues for widening BOS and lengthening stride, was better on balance to day no leans   Stairs            Wheelchair Mobility    Modified Rankin (Stroke Patients Only)       Balance Overall balance assessment: Needs assistance Sitting-balance support: Feet supported;No upper extremity supported Sitting balance-Leahy Scale: Fair Sitting balance - Comments: able to hold herself up better today no lean apparent   Standing balance support: Bilateral upper extremity supported;During functional activity Standing balance-Leahy Scale: Poor Standing balance comment: using RW for balance but no lean apparent today                    Cognition Arousal/Alertness: Awake/alert Behavior During Therapy: Flat affect Overall Cognitive Status: No family/caregiver present to determine baseline cognitive functioning                      Exercises      General Comments        Pertinent Vitals/Pain Pain Score: 4  Pain Location: Headache and stiff back Pain Descriptors / Indicators: Aching Pain Intervention(s): Monitored during session    Home Living                      Prior Function            PT Goals (current goals can now be found in the care plan section) Progress towards PT goals: Progressing toward goals    Frequency  Min 3X/week    PT Plan Current plan remains appropriate    Co-evaluation  End of Session Equipment Utilized During Treatment: Gait belt Activity Tolerance: Patient tolerated treatment well Patient left: in bed;with call bell/phone within reach;with bed alarm set     Time: 0945-1005 PT Time Calculation (min) (ACUTE ONLY): 20 min  Charges:                       G Codes:      Jodi Geralds, SPTA 04/15/2014, 11:25 AM

## 2014-04-15 NOTE — Discharge Summary (Signed)
Physician Discharge Summary  Lindsey Gomez ION:[id number] DOB: 07-16-1945 DOA: 04/09/2014  PCP: Mathews Argyle, MD  Admit date: 04/09/2014 Discharge date: 04/15/2014  Time spent: greater than 30 minutes  Discharge Diagnoses:  Principal Problem:   HCAP (healthcare-associated pneumonia) Active Problems:   Anxiety state   Essential hypertension   PANCREATITIS, CHRONIC   Personal history of malignant neoplasm of breast   GERD (gastroesophageal reflux disease)   Barrett's esophagus   Abnormality of gait   Acute renal failure   DM2 (diabetes mellitus, type 2)   Thrombocytopenia   Severe protein-calorie malnutrition   Memory impairment   Chronic migraine   Hypokalemia   Retention of urine   Discharge Condition: stable  Diet recommendation: D2, nectar thickened  Filed Weights   04/09/14 1006 04/09/14 1259  Weight: 39.009 kg (86 lb) 39.2 kg (86 lb 6.7 oz)    History of present illness:  69 year old female with uncontrolled type 2 diabetes mellitus, chronic pancreatitis status post partial pancreatectomy, chronic migraine headaches, GERD, fibromyalgia history of right-sided breast cancer, essential hypertension, COPD (quit smoking 2 months back) and mild memory impairment who was recently hospitalized for nonketotic hyperosmolar state and acute kidney injury the was brought to the ED with fever of 103.20F at home associated with productive cough with greenish sputum for the past 2-3 days and some dyspnea on exertion and generalized fatigue. Patient admitted with healthcare associated pneumonia.  Hospital Course:  HCAP (healthcare-associated pneumonia) vs aspiration pneumonia Started on vancomycin and cefepime. Blood cultures negativeUrine strep antigen negative. urine for Legionella antibody negative. - follow-up chest x-ray showing worsened right upper lobe pneumonia. Cefepime switched to Zosyn for anaerobic coverage. -Symptoms concerning for aspiration pneumonia. Seen by  swallow eval and recommend dysphagia level II diet with nectar thick liquid.  By discharge, improved, afebrile and tolerating oral antibiotics   Acute renal failure Likely secondary to dehydration. Resolved with IV fluids.  Urinary retention Noted for about 500 mL urine retained in bladder scan overnight. Renal ultrasound shows possible medical renal disease.  Foley catheter placed for 24 hours and removed. Has been able to void without difficulty.  Hypokalemia corrected   DM2 (diabetes mellitus, type 2) Remained controlled   Thrombocytopenia Likely secondary to acute illness. Slowly improving   Anxiety state Change ativan to qhs. Sleepy during day   Essential hypertension Blood pressure stable. Continue home medications  Metabolic acidosis Resolved with treatsment of pneumonia and IVF   GERD (gastroesophageal reflux disease) and Barrett's esophagus Continue PPI  Chronic migraine On multiple medications which have been resumed. Follows with outpatient neurology   Abnormality of gait Follows with neurology as outpatient. Previous imaging showing age disproportionate mild generalized cerebral atrophy. CT head repeated given urinary incontinence and unsteady gait and no new findings.   Severe protein-calorie malnutrition Nutrition consult   Memory impairment Mild. Continue home medications.  Diarrhea: c diff neg. Prn imodium  COPD and ongoing tobacco use Continue home inhalers. Add prn albuterol nebs. Continue nicotine patch.   Iron def anemia Continue iron sulfate  Dysphagia: on D2 nectar  Deconditioning: insurance has denied coverage for CIR. Patient refuses SNF. Home with aide, PT, OT, RN,   Consultants:  None  Procedures:  None  Discharge Exam: Filed Vitals:   04/15/14 0511  BP: 121/78  Pulse: 78  Temp: 97.9 F (36.6 C)  Resp: 16    General: alert. comfortable Cardiovascular: RRR without MGR Respiratory: CTA without WRR Abd: s,  nt, nd No CCE  Discharge Instructions  Discharge Instructions    Discharge instructions    Complete by:  As directed   Chopped diet with nectar thickened liquids          Current Discharge Medication List    START taking these medications   Details  amoxicillin-clavulanate (AUGMENTIN) 875-125 MG per tablet Take 1 tablet by mouth 2 (two) times daily. Until gone Qty: 8 tablet, Refills: 0      CONTINUE these medications which have CHANGED   Details  amLODipine (NORVASC) 10 MG tablet Take 1 tablet (10 mg total) by mouth daily. Qty: 30 tablet, Refills: 1      CONTINUE these medications which have NOT CHANGED   Details  atenolol (TENORMIN) 100 MG tablet Take 1 tablet (100 mg total) by mouth every morning. Qty: 30 tablet, Refills: 11    atorvastatin (LIPITOR) 10 MG tablet Take 1 tablet (10 mg total) by mouth daily. Qty: 30 tablet, Refills: 11    calcitRIOL (ROCALTROL) 0.5 MCG capsule Take 1 mcg by mouth daily.    dicyclomine (BENTYL) 10 MG capsule TAKE 1 CAPSULE (10 MG TOTAL) BY MOUTH 3 (THREE) TIMES DAILY BEFORE MEALS. Qty: 90 capsule, Refills: 1    docusate sodium (COLACE) 100 MG capsule Take 100 mg by mouth daily as needed for mild constipation.    ferrous sulfate 325 (65 FE) MG tablet Take 325 mg by mouth daily with breakfast.    glucagon (GLUCAGON EMERGENCY) 1 MG injection Inject 1 mg into the vein once as needed (for glucose).    hydrocortisone (CORTEF) 10 MG tablet Take 10 mg by mouth 2 (two) times daily.     insulin aspart (NOVOLOG) 100 UNIT/ML FlexPen Inject 0-8 Units into the skin 3 (three) times daily with meals. 70-120 = 0 units, 121-150 = 1 unit, 151-200 = 2 units, 201-250 = 3 units, 251-300 = 5 units, 301-350 = 7 units.    LEVEMIR FLEXTOUCH 100 UNIT/ML Pen Inject 11 Units into the skin at bedtime.  Refills: 3    loperamide (IMODIUM) 2 MG capsule Take 2-4 mg by mouth as needed for diarrhea or loose stools.     LORazepam (ATIVAN) 1 MG tablet Take 1 tablet  (1 mg total) by mouth 2 (two) times daily. Qty: 60 tablet, Refills: 3    LYRICA 25 MG capsule Take 25 mg by mouth 2 (two) times daily.     memantine (NAMENDA XR) 7 MG CP24 24 hr capsule One tablet daily for one week, then take 2 capsules daily for one week, then take 3 capsules daily for one week. Qty: 42 capsule, Refills: 0    mometasone (NASONEX) 50 MCG/ACT nasal spray Place 2 sprays into both nostrils daily as needed (for congestion).     omeprazole (PRILOSEC) 40 MG capsule Take 1 capsule (40 mg total) by mouth 2 (two) times daily. Qty: 60 capsule, Refills: 11    ondansetron (ZOFRAN) 8 MG tablet Take 1 tablet (8 mg total) by mouth 2 (two) times daily. Qty: 40 tablet, Refills: 1    sertraline (ZOLOFT) 25 MG tablet Take 25 mg by mouth daily.  Refills: 6    topiramate (TOPAMAX) 25 MG tablet Take 25 mg by mouth 2 (two) times daily.    triamcinolone (KENALOG) 0.1 % ointment Apply 1 application topically 2 (two) times daily as needed (itching).     zolmitriptan (ZOMIG) 5 MG tablet Take 1 tablet (5 mg total) by mouth 2 (two) times daily as needed for migraine. Qty: 10 tablet, Refills: 3  STOP taking these medications     insulin detemir (LEVEMIR) 100 UNIT/ML injection      isometheptene-acetaminophen-dichloralphenazone (MIDRIN) 65-325-100 MG capsule      nicotine (NICODERM CQ - DOSED IN MG/24 HR) 7 mg/24hr patch      saccharomyces boulardii (FLORASTOR) 250 MG capsule        Allergies  Allergen Reactions  . Milnacipran Swelling and Other (See Comments)    Headache and hand swelling   . Rizatriptan Benzoate Nausea And Vomiting  . Gabapentin Other (See Comments)    Weak muscles  . Moxifloxacin Other (See Comments)    Unknown  . Sulfonamide Derivatives Rash  . Venlafaxine Other (See Comments)    Unknown      The results of significant diagnostics from this hospitalization (including imaging, microbiology, ancillary and laboratory) are listed below for reference.     Significant Diagnostic Studies: Dg Chest 2 View  04/11/2014   CLINICAL DATA:  Pneumonia.  Hx breast cancer, COPD, and diabetes.  EXAM: CHEST - 2 VIEW  COMPARISON:  04/09/2014  FINDINGS: Worsening coarse airspace consolidation in the anterior posterior segments right upper lobe and scattered through the right mid lobe. Left lung remains clear, with somewhat coarse attenuated bronchovascular markings. Surgical clips in the left hilum. Suspect small bilateral pleural effusions. Heart size remains normal. Atheromatous aorta. Previous left thoracotomy.  Old healed right rib fractures. Surgical clips in the left upper abdomen  IMPRESSION: 1. Worsening right upper and middle lobe airspace disease   Electronically Signed   By: Arne Cleveland M.D.   On: 04/11/2014 11:29   Dg Chest 2 View  04/09/2014   CLINICAL DATA:  Two day history of chest pain and fever with weakness.  EXAM: CHEST  2 VIEW  COMPARISON:  Chest CT 01/13/2014.  FINDINGS: The cardiac silhouette, mediastinal and hilar contours are normal and stable. Stable left basilar scarring changes and probable pleural thickening. Underlying emphysematous changes. There is a the right upper lobe infiltrate. The bony thorax is intact.  IMPRESSION: Right upper lobe pneumonia.  Chronic emphysematous changes and left basilar scarring.   Electronically Signed   By: Kalman Jewels M.D.   On: 04/09/2014 11:36   Ct Head Wo Contrast  04/11/2014   CLINICAL DATA:  Unsteady gait, confusion  EXAM: CT HEAD WITHOUT CONTRAST  TECHNIQUE: Contiguous axial images were obtained from the base of the skull through the vertex without intravenous contrast.  COMPARISON:  MR 10/17/2013 and earlier studies  FINDINGS: Atherosclerotic and physiologic intracranial calcifications. Stable small anterior falcine lipoma. Diffuse parenchymal atrophy. Patchy areas of hypoattenuation in deep and periventricular Hacker matter bilaterally. Negative for acute intracranial hemorrhage, mass lesion,  acute infarction, midline shift, or mass-effect. Acute infarct may be inapparent on noncontrast CT. Ventricles and sulci symmetric. Bone windows demonstrate no focal lesion.  IMPRESSION: 1. Negative for bleed or other acute intracranial process. 2. Atrophy and nonspecific Werts matter changes.   Electronically Signed   By: Arne Cleveland M.D.   On: 04/11/2014 11:30   US Renal  04/10/2014   CLINICAL DATA:  Urinary retention.  Initial encounter.  EXAM: RENAL/URINARY TRACT ULTRASOUND COMPLETE  COMPARISON:  Renal ultrasound performed 03/12/2014, and CT of the abdomen and pelvis from 12/31/2013  FINDINGS: Right Kidney:  Length: 11.6 cm. Diffusely increased parenchymal echogenicity raises concern for medical renal disease. No hydronephrosis visualized. There is a 3 mm nonobstructing stone at the lower pole of the right kidney. A small 0.9 cm cyst is noted at the lower  pole of the right kidney.  Left Kidney:  Length: 11.8 cm. Mildly increased parenchymal echogenicity raises concern for medical renal disease. No hydronephrosis visualized. There is a 5 mm nonobstructing stone at the lower pole of the left kidney. A 1.6 cm cyst is noted at the interpole region of the left kidney.  Bladder:  Decompressed, with a Foley catheter in place.  IMPRESSION: 1. No evidence of hydronephrosis. 2. Diffusely increased renal parenchymal echogenicity raises concern for medical renal disease. 3. Nonobstructing bilateral renal stones again noted. 4. Small bilateral renal cysts seen.   Electronically Signed   By: Garald Balding M.D.   On: 04/10/2014 12:21    Microbiology: Recent Results (from the past 240 hour(s))  Blood culture (routine x 2)     Status: None   Collection Time: 04/09/14 10:55 AM  Result Value Ref Range Status   Specimen Description BLOOD LEFT ARM  Final   Special Requests BOTTLES DRAWN AEROBIC AND ANAEROBIC 5CC  Final   Culture   Final    NO GROWTH 5 DAYS Performed at [facility]    Report Status  04/15/2014 FINAL  Final  Blood culture (routine x 2)     Status: None   Collection Time: 04/09/14 11:00 AM  Result Value Ref Range Status   Specimen Description BLOOD RIGHT ANTECUBITAL  Final   Special Requests BOTTLES DRAWN AEROBIC AND ANAEROBIC 5CC  Final   Culture   Final    NO GROWTH 5 DAYS Performed at [facility]    Report Status 04/15/2014 FINAL  Final  Clostridium Difficile by PCR     Status: None   Collection Time: 04/12/14 12:37 PM  Result Value Ref Range Status   C difficile by pcr NEGATIVE NEGATIVE Final     Labs: Basic Metabolic Panel:  Recent Labs Lab 04/11/14 0525 04/12/14 0524 04/13/14 0521 04/14/14 0514 04/15/14 0528  NA 137 136 141 141 136  K 3.4* 2.8* 3.6 3.0* 3.8  CL 113* 113* 118* 117* 113*  CO2 18* 15* 16* 20 17*  GLUCOSE 203* 46* 75 61* 349*  BUN 20 16 14 10 13   CREATININE 1.04 0.93 1.05 0.88 0.99  CALCIUM 8.8 8.6 8.7 8.5 8.6  MG  --   --   --  1.8  --    Liver Function Tests: No results for input(s): AST, ALT, ALKPHOS, BILITOT, PROT, ALBUMIN in the last 168 hours. No results for input(s): LIPASE, AMYLASE in the last 168 hours. No results for input(s): AMMONIA in the last 168 hours. CBC:  Recent Labs Lab 04/09/14 1054 04/10/14 0425 04/11/14 0525 04/12/14 0524 04/13/14 0521 04/14/14 0514  WBC 5.3 8.5 7.2 8.1 5.6 5.5  NEUTROABS 3.9  --   --   --   --   --   HGB 13.8 10.0* 9.2* 9.7* 8.8* 8.7*  HCT 40.8 29.5* 27.3* 28.1* 26.0* 25.6*  MCV 92.3 92.2 90.7 89.5 90.9 89.5  PLT 59* 87* 81* 116* 104* 133*   Cardiac Enzymes: No results for input(s): CKTOTAL, CKMB, CKMBINDEX, TROPONINI in the last 168 hours. BNP: BNP (last 3 results) No results for input(s): BNP in the last 8760 hours.  ProBNP (last 3 results) No results for input(s): PROBNP in the last 8760 hours.  CBG:  Recent Labs Lab 04/14/14 1156 04/14/14 1721 04/14/14 2203 04/15/14 0757 04/15/14 1204  GLUCAP 104* 106* 348* 230* 325*        Signed:  [facility] 04/15/2014, 12:58 PM

## 2014-04-21 ENCOUNTER — Encounter: Payer: Self-pay | Admitting: *Deleted

## 2014-04-21 ENCOUNTER — Ambulatory Visit (INDEPENDENT_AMBULATORY_CARE_PROVIDER_SITE_OTHER): Payer: BLUE CROSS/BLUE SHIELD | Admitting: Neurology

## 2014-04-21 ENCOUNTER — Encounter: Payer: Self-pay | Admitting: Neurology

## 2014-04-21 VITALS — BP 122/75 | HR 59 | Wt 88.0 lb

## 2014-04-21 DIAGNOSIS — G43719 Chronic migraine without aura, intractable, without status migrainosus: Secondary | ICD-10-CM | POA: Insufficient documentation

## 2014-04-21 MED ORDER — ONABOTULINUMTOXINA 100 UNITS IJ SOLR
200.0000 [IU] | Freq: Once | INTRAMUSCULAR | Status: AC
  Administered 2014-04-21: 200 [IU] via INTRAMUSCULAR

## 2014-04-21 NOTE — Progress Notes (Addendum)
Reason for visit: Seizure  Lindsey Gomez is a 69 y.o. female  History of present illness:  Lindsey Gomez is a 69 year old right-handed [place] female, accompanied by her caregiver Lindsey Gomez referred by Dr. Jannifer Franklin for Botox injection for chronic migraine headaches, this is her initial visit with me, December 24 6710  She has complicated past medical history, including diabetes, not insulin-dependent, hypertension, fibromyalgia, right breast cancer, COPD, opiate dependence, memory trouble, gait difficulty, chronic pancreatitis, she was admitted to the hospital in July 2015, had seizure, was thought due to benzodiazepine withdrawal  She reported a long-standing history of migraines, had a history of migraine since age 83, getting worse since 2013, to a daily basis, she also had the habit of taking multiple dose of Zomig daily for a while, now she is no longer taking it, 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 Jannifer Franklin in [place] 2015, she was found to have mild parkinsonian features,  She had 1st BOTOX injection as migraine prevention in Oct 2015, responded very well.  She is also taking Topamax 100 mg half tablets twice a day as migraine prevention, reported excessive weight loss recently  UPDATE Feb 10th 2016: She resonded very well to her first Botox injection in Oct 2015, 80% improvement, per patient and caregiver, She came in with her care giver [name], who has been working with her over the past 2 years, she lives at home with her husband, who has ambulatory difficulty, her daughter lives in [place] is her power of attorney   She only has 2 major migraine  headaches since last injection, but every day, she take Tylenol twice because of her frontal pressure headaches,   Past Medical History  Diagnosis Date  . Depression   . HTN (hypertension)   . Chronic pain   . Fibromyalgia   . Migraines   . History of breast cancer     Right  . Chronic pancreatitis   . Vitamin D deficiency   . GERD (gastroesophageal reflux disease)   . Finger dislocation 2009    L 3rd  . COPD (chronic obstructive pulmonary disease)   . Anemia   . Fractured sternum 11/2008  . Osteoporosis     Reclast too expensive  . Barrett's esophagus   . Chronic fatigue   . Diverticulosis   . Opioid dependence   . 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 Abnormality of gait 10/12/2013  . Anxiety   . Depression   . Convulsions/seizures 10/12/2013    Possible benzodiazepine withdrawal seizure, x1-no further seizure activity   . Cancer     HX OF BREAST CANCER -chemo, radiation  . Type II or unspecified type diabetes mellitus without mention of complication, not stated as uncontrolled     Past Surgical History  Procedure Laterality Date  . Nasal sinus surgery    . Pancreatectomy      40%  . Cholecystectomy    . Appendectomy    . Abdominal hysterectomy    . Lobectomy Left 2005    granulomatous lesion.   . Breast lumpectomy Left   . Ercp N/A 10/05/2013    Procedure: ENDOSCOPIC RETROGRADE CHOLANGIOPANCREATOGRAPHY (ERCP);  Surgeon: Ladene Artist, MD;  Location: [facility];  Service: Endoscopy;  Laterality: N/A;  . Cataract extraction Bilateral   . Botox injection      recent botox injection 12-23-13 injection for Migraines  . Cataract extraction, bilateral Bilateral     bilateral  . Eye surgery      1 eye "membrane surgery"  . Ercp N/A 01/07/2014    Procedure: ENDOSCOPIC RETROGRADE CHOLANGIOPANCREATOGRAPHY (ERCP);  Surgeon: Inda Castle, MD;  Location: [facility] ENDOSCOPY;  Service: Endoscopy;  Laterality: N/A;  . Spyglass cholangioscopy N/A 01/07/2014    Procedure: [id number]  CHOLANGIOSCOPY;  Surgeon: Inda Castle, MD;  Location: [facility] ENDOSCOPY;  Service: Endoscopy;  Laterality: N/A;    Family History  Problem Relation Age of Onset  . Heart attack Brother   . COPD Father   . Heart disease Father   . Leukemia Mother   . Diabetes Maternal Uncle   . Dementia Sister   . Alzheimer's disease Sister   . Heart attack Brother   . Colon cancer Neg Hx   . Colon polyps Neg Hx     Social history:  reports that she quit smoking about 2 months ago. Her smoking use included Cigarettes. She has a 40 pack-year smoking history. She has never used smokeless tobacco. She reports that she does not drink alcohol or use illicit drugs.  Medications:  Current Outpatient Prescriptions on File Prior to Visit  Medication Sig Dispense Refill  . amLODipine (NORVASC) 10 MG tablet Take 1 tablet (10 mg total) by mouth daily. 30 tablet 1  . atenolol (TENORMIN) 100 MG tablet Take 1 tablet (100 mg total) by mouth every morning. 30 tablet 11  . atorvastatin (LIPITOR) 10 MG tablet Take 1 tablet (10 mg total) by mouth daily. 30 tablet 11  . dicyclomine (BENTYL) 10 MG capsule TAKE 1 CAPSULE (10 MG TOTAL) BY MOUTH 3 (THREE) TIMES DAILY BEFORE MEALS. 90 capsule 1  . docusate sodium (COLACE) 100 MG capsule Take 100 mg by mouth daily as needed for mild constipation.    . ferrous sulfate 325 (65 FE) MG tablet Take 325 mg by mouth daily with breakfast.    . glucagon (GLUCAGON EMERGENCY) 1 MG injection Inject 1 mg into the vein once as needed (for glucose).    . hydrocortisone (CORTEF) 10 MG tablet Take 10 mg by mouth 2 (two) times daily.     . insulin aspart (NOVOLOG) 100 UNIT/ML FlexPen Inject 0-8 Units into the skin 3 (three) times daily with meals. 70-120 = 0 units, 121-150 = 1 unit, 151-200 = 2 units, 201-250 = 3 units, 251-300 = 5 units, 301-350 = 7 units.    [name] LEVEMIR FLEXTOUCH 100 UNIT/ML Pen Inject 11 Units into the skin at bedtime.   3  . loperamide (IMODIUM) 2 MG capsule Take 2-4 mg by mouth  as needed for diarrhea or loose stools.     [name] LORazepam (ATIVAN) 1 MG tablet Take 1 tablet (1 mg total) by mouth 2 (two) times daily. 60 tablet 3  . LYRICA 25 MG capsule Take 25 mg by mouth 2 (two) times daily.     . memantine (NAMENDA XR) 7 MG CP24 24 hr capsule One tablet daily for one week, then take 2 capsules daily for one week, then take 3 capsules daily for one week. (Patient taking differently: Take 7 mg by mouth 2 (two) times daily. ) 42 capsule 0  . mometasone (NASONEX) 50 MCG/ACT nasal spray Place 2 sprays into  both nostrils daily as needed (for congestion).     [name] omeprazole (PRILOSEC) 40 MG capsule Take 1 capsule (40 mg total) by mouth 2 (two) times daily. (Patient taking differently: Take 40 mg by mouth daily. ) 60 capsule 11  . ondansetron (ZOFRAN) 8 MG tablet Take 1 tablet (8 mg total) by mouth 2 (two) times daily. 40 tablet 1  . sertraline (ZOLOFT) 25 MG tablet Take 25 mg by mouth daily.   6  . topiramate (TOPAMAX) 25 MG tablet Take 25 mg by mouth 2 (two) times daily.    [name] triamcinolone (KENALOG) 0.1 % ointment Apply 1 application topically 2 (two) times daily as needed (itching).     . zolmitriptan (ZOMIG) 5 MG tablet Take 1 tablet (5 mg total) by mouth 2 (two) times daily as needed for migraine. (Patient taking differently: Take 5 mg by mouth daily as needed for migraine. ) 10 tablet 3   No current facility-administered medications on file prior to visit.      Allergies  Allergen Reactions  . Milnacipran Swelling and Other (See Comments)    Headache and hand swelling   . Rizatriptan Benzoate Nausea And Vomiting  . Gabapentin Other (See Comments)    Weak muscles  . Moxifloxacin Other (See Comments)    Unknown  . Sulfonamide Derivatives Rash  . Venlafaxine Other (See Comments)    Unknown    ROS:  Out of a complete 14 system review of symptoms, the patient complains only of the following symptoms, and all other reviewed systems are negative. As above  Blood pressure  122/75, pulse 59, weight 88 lb (39.917 kg).  Physical Exam  PHYSICAL EXAMINATOINS:  Generalized: In no acute distress  Neck: Supple, no carotid bruits   Cardiac: Regular rate rhythm  Pulmonary: Clear to auscultation bilaterally  Musculoskeletal: No deformity  Neurological examination  Mentation: Alert oriented to time, place, history taking, and causual conversation  Cranial nerve II-XII: Pupils were equal round reactive to light extraocular movements were full, visual field were full on confrontational test.  Bilateral fundi were sharp  l mild right lower face weakness,Uvula tongue midline.  head turning and shoulder shrug and were normal and symmetric.Tongue protrusion into cheek strength was normal.  Motor: She has mild bilateral hip flexion weakness, mild to moderate right more than left rigidity, bradykinesia,  Coordination: Normal finger to nose, heel-to-shin bilaterally there was no truncal ataxia  Gait: Need to push up to get up from seated position, narrow based, unsteady,enblock turning  Deep tendon reflexes: hypoactive and symmetric  Assessment/Plan: 69 year old female, with complicated past medical history, including diabetes, insulin-dependent, chronic pancreatitis, fibromyalgia, chronic migraine, likely a component of medicine rebound headaches, now complains of chronic daily headaches, frequent moderate to severe migraine headaches, on polypharmacy treatment,  She is here Botox injection as migraine prevention. Potential side effect explained, consent form was signed BOTOX injection was performed according to protocol by [employer]. 100 units of BOTOX was dissolved into 2 cc NS   Total of 155 units, discard 45 units.   Corrugator 2 sites, 10 units Procerus 1 site, 5 unit Frontalis 4 sites,  20 units, Temporalis 8 sites,  40 units  Occipitalis 6 sites, 30 units Cervical Paraspinal, 4 sites, 20 units Trapezius, 6 sites, 30 units  Patient tolerate the injection  well. Will return for repeat injection in 3 months.  Marcial Pacas, M.D. Ph.D.  [facility] [place], [place] [address] Phone: [phone] Fax:  [phone] 

## 2014-04-22 ENCOUNTER — Ambulatory Visit
Admission: RE | Admit: 2014-04-22 | Discharge: 2014-04-22 | Disposition: A | Discharge status: Home / Self Care | Payer: Medicare Other | Source: Ambulatory Visit | Attending: Geriatric Medicine | Admitting: Geriatric Medicine

## 2014-04-22 ENCOUNTER — Other Ambulatory Visit: Payer: Self-pay | Admitting: Geriatric Medicine

## 2014-04-22 DIAGNOSIS — J189 Pneumonia, unspecified organism: Secondary | ICD-10-CM

## 2014-05-13 ENCOUNTER — Encounter: Payer: Self-pay | Admitting: Gastroenterology

## 2014-05-17 ENCOUNTER — Ambulatory Visit (INDEPENDENT_AMBULATORY_CARE_PROVIDER_SITE_OTHER): Payer: Medicare Other | Admitting: Psychiatry

## 2014-05-17 ENCOUNTER — Telehealth (HOSPITAL_COMMUNITY): Payer: Self-pay

## 2014-05-17 ENCOUNTER — Encounter (HOSPITAL_COMMUNITY): Payer: Self-pay | Admitting: Psychiatry

## 2014-05-17 VITALS — BP 125/78 | HR 63 | Ht 62.0 in | Wt 88.6 lb

## 2014-05-17 DIAGNOSIS — F411 Generalized anxiety disorder: Secondary | ICD-10-CM | POA: Diagnosis not present

## 2014-05-17 MED ORDER — MIRTAZAPINE 7.5 MG PO TABS: 7.5000 mg | ORAL_TABLET | Freq: Every day | ORAL | Status: DC

## 2014-05-17 NOTE — Telephone Encounter (Signed)
Telephone call with Ms. Makowski who brought patient in for appointment today to verify Dr. Adele Schilder did indeed discontinue patient's Zoloft and Melatonin as Ms. Makowski reported and changed patient to Remeron to aid with her sleep.  Ms. Gracelyn Nurse stated she had misunderstood but patient was sure she was not to continue Zoloft.  Patient to return for next evaluation in 3 weeks, 06/10/14.

## 2014-05-17 NOTE — Progress Notes (Signed)
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 Abnormal   Collection Time: 04/09/14 11:45 AM  Result Value Ref Range   Color, Urine YELLOW YELLOW   APPearance CLEAR CLEAR   Specific Gravity, Urine 1.017 1.005 - 1.030   pH 6.5 5.0 - 8.0   Glucose, UA NEGATIVE NEGATIVE mg/dL   Hgb urine dipstick NEGATIVE NEGATIVE   Bilirubin Urine NEGATIVE NEGATIVE   Ketones, ur NEGATIVE NEGATIVE mg/dL   Protein, ur 100 (A) NEGATIVE mg/dL   Urobilinogen, UA 0.2 0.0 - 1.0 mg/dL   Nitrite NEGATIVE NEGATIVE   Leukocytes, UA NEGATIVE NEGATIVE  Urine microscopic-add on     Status: None   Collection Time: 04/09/14 11:45 AM  Result Value Ref Range   Squamous Epithelial / LPF RARE RARE   WBC, UA 0-2 <3 WBC/hpf   RBC / HPF 0-2 <3 RBC/hpf   Bacteria, UA RARE RARE  HIV antibody     Status: None   Collection Time: 04/09/14  3:00 PM  Result Value Ref Range   HIV Screen 4th Generation wRfx Non  Reactive Non Reactive    Comment: (NOTE) Performed At: [facility] [place], [place] [id number] Lindon Romp MD PY:[id number]   Glucose, capillary     Status: Abnormal   Collection Time: 04/09/14  5:23 PM  Result Value Ref Range   Glucose-Capillary 228 (H) 70 - 99 mg/dL  Glucose, capillary     Status: Abnormal   Collection Time: 04/09/14  9:14 PM  Result Value Ref Range   Glucose-Capillary 145 (H) 70 - 99 mg/dL  Legionella antigen, urine     Status: None   Collection Time: 04/09/14 10:28 PM  Result Value Ref Range   Specimen Description URINE, CLEAN CATCH    Special Requests NONE    Legionella Antigen, Urine      Negative for Legionella pneumophila serogroup 1  Legionella pneumophila serogroup 1 antigen can be detected in urine within 2 to 3 days of infection and may persist even after treatment. This  assay does not detect other Legionella species or serogroups. Performed at [facility]    Report Status 04/12/2014 FINAL   Strep pneumoniae urinary antigen     Status: None   Collection Time: 04/09/14 10:28 PM  Result Value Ref Range   Strep Pneumo Urinary Antigen NEGATIVE NEGATIVE    Comment:        Infection due to S. pneumoniae cannot be absolutely ruled out since the antigen present may be below the detection limit of the test.   Basic metabolic panel     Status: Abnormal   Collection Time: 04/10/14  4:25 AM  Result Value Ref Range   Sodium 138 135 - 145 mmol/L   Potassium 2.9 (L) 3.5 - 5.1 mmol/L   Chloride 112 96 - 112 mmol/L   CO2 19 19 - 32 mmol/L   Glucose, Bld 147 (H) 70 - 99 mg/dL   BUN 22 6 - 23 mg/dL   Creatinine, Ser 1.14 (H) 0.50 - 1.10 mg/dL   Calcium 9.2 8.4 - 10.5 mg/dL   GFR calc non Af Amer 49 (L) >90 mL/min   GFR calc Af Amer 57 (L) >90 mL/min    Comment: (NOTE) The eGFR has been calculated using the CKD EPI equation. This calculation has not been  validated in all clinical situations. eGFR's persistently <90 mL/min signify possible Chronic Kidney Disease.    Anion gap 7 5 - 15  CBC     Status: Abnormal   Collection Time: 04/10/14  4:25 AM  Result Value Ref Range   WBC 8.5 4.0 - 10.5 K/uL   RBC 3.20 (L) 3.87 - 5.11 MIL/uL   Hemoglobin 10.0 (L) 12.0 - 15.0 g/dL    Comment: REPEATED TO VERIFY DELTA CHECK NOTED    HCT 29.5 (L) 36.0 - 46.0 %   MCV 92.2 78.0 - 100.0 fL   MCH 31.3 26.0 - 34.0 pg   MCHC 33.9 30.0 - 36.0 g/dL   RDW 15.6 (H) 11.5 - 15.5 %   Platelets 87 (L) 150 - 400 K/uL    Comment: REPEATED TO VERIFY DELTA CHECK NOTED CONSISTENT WITH PREVIOUS RESULT   Lactic acid, plasma     Status: None   Collection Time: 04/10/14  4:25 AM  Result Value Ref Range   Lactic Acid, Venous 1.4 0.5 - 2.0 mmol/L  Glucose, capillary     Status: Abnormal   Collection Time: 04/10/14  7:48 AM  Result Value Ref Range   Glucose-Capillary 129 (H) 70 - 99 mg/dL  Glucose, capillary     Status: Abnormal   Collection Time: 04/10/14 12:27 PM  Result Value Ref Range   Glucose-Capillary 151 (H) 70 - 99 mg/dL  Glucose, capillary     Status: Abnormal   Collection Time: 04/10/14  5:07 PM  Result Value Ref Range   Glucose-Capillary 180 (H) 70 - 99 mg/dL   Comment 1 Documented in Chart   Glucose, capillary     Status: Abnormal   Collection Time: 04/10/14 10:06 PM  Result Value Ref Range   Glucose-Capillary 263 (H) 70 - 99 mg/dL   Comment 1 Notify RN    Comment 2 Documented in Chart   CBC     Status: Abnormal   Collection Time: 04/11/14  5:25 AM  Result Value Ref Range   WBC 7.2 4.0 - 10.5  K/uL   RBC 3.01 (L) 3.87 - 5.11 MIL/uL   Hemoglobin 9.2 (L) 12.0 - 15.0 g/dL   HCT 27.3 (L) 36.0 - 46.0 %   MCV 90.7 78.0 - 100.0 fL   MCH 30.6 26.0 - 34.0 pg   MCHC 33.7 30.0 - 36.0 g/dL   RDW 15.8 (H) 11.5 - 15.5 %   Platelets 81 (L) 150 - 400 K/uL    Comment: CONSISTENT WITH PREVIOUS RESULT  Basic metabolic panel     Status: Abnormal    Collection Time: 04/11/14  5:25 AM  Result Value Ref Range   Sodium 137 135 - 145 mmol/L   Potassium 3.4 (L) 3.5 - 5.1 mmol/L   Chloride 113 (H) 96 - 112 mmol/L   CO2 18 (L) 19 - 32 mmol/L   Glucose, Bld 203 (H) 70 - 99 mg/dL   BUN 20 6 - 23 mg/dL   Creatinine, Ser 1.04 0.50 - 1.10 mg/dL   Calcium 8.8 8.4 - 10.5 mg/dL   GFR calc non Af Amer 55 (L) >90 mL/min   GFR calc Af Amer 63 (L) >90 mL/min    Comment: (NOTE) The eGFR has been calculated using the CKD EPI equation. This calculation has not been validated in all clinical situations. eGFR's persistently <90 mL/min signify possible Chronic Kidney Disease.    Anion gap 6 5 - 15  Glucose, capillary     Status: Abnormal   Collection Time: 04/11/14  7:54 AM  Result Value Ref Range   Glucose-Capillary 178 (H) 70 - 99 mg/dL  Glucose, capillary     Status: Abnormal   Collection Time: 04/11/14  1:03 PM  Result Value Ref Range   Glucose-Capillary 128 (H) 70 - 99 mg/dL  Glucose, capillary     Status: Abnormal   Collection Time: 04/11/14  4:43 PM  Result Value Ref Range   Glucose-Capillary 100 (H) 70 - 99 mg/dL  Glucose, capillary     Status: Abnormal   Collection Time: 04/11/14 10:12 PM  Result Value Ref Range   Glucose-Capillary 128 (H) 70 - 99 mg/dL   Comment 1 Notify RN    Comment 2 Documented in Chart   CBC     Status: Abnormal   Collection Time: 04/12/14  5:24 AM  Result Value Ref Range   WBC 8.1 4.0 - 10.5 K/uL   RBC 3.14 (L) 3.87 - 5.11 MIL/uL   Hemoglobin 9.7 (L) 12.0 - 15.0 g/dL   HCT 28.1 (L) 36.0 - 46.0 %   MCV 89.5 78.0 - 100.0 fL   MCH 30.9 26.0 - 34.0 pg   MCHC 34.5 30.0 - 36.0 g/dL   RDW 15.4 11.5 - 15.5 %   Platelets 116 (L) 150 - 400 K/uL    Comment: CONSISTENT WITH PREVIOUS RESULT  Basic metabolic panel     Status: Abnormal   Collection Time: 04/12/14  5:24 AM  Result Value Ref Range   Sodium 136 135 - 145 mmol/L   Potassium 2.8 (L) 3.5 - 5.1 mmol/L    Comment: DELTA CHECK NOTED   Chloride 113 (H) 96 -  112 mmol/L   CO2 15 (L) 19 - 32 mmol/L   Glucose, Bld 46 (L) 70 - 99 mg/dL   BUN 16 6 - 23 mg/dL   Creatinine, Ser 0.93 0.50 - 1.10 mg/dL   Calcium 8.6 8.4 - 10.5 mg/dL   GFR calc non Af Amer 63 (L) >90 mL/min   GFR calc Af Amer 73 (L) >90  mL/min    Comment: (NOTE) The eGFR has been calculated using the CKD EPI equation. This calculation has not been validated in all clinical situations. eGFR's persistently <90 mL/min signify possible Chronic Kidney Disease.    Anion gap 8 5 - 15  Glucose, capillary     Status: Abnormal   Collection Time: 04/12/14  7:36 AM  Result Value Ref Range   Glucose-Capillary 40 (LL) 70 - 99 mg/dL  Glucose, capillary     Status: Abnormal   Collection Time: 04/12/14  7:43 AM  Result Value Ref Range   Glucose-Capillary 44 (LL) 70 - 99 mg/dL  Glucose, capillary     Status: Abnormal   Collection Time: 04/12/14  8:08 AM  Result Value Ref Range   Glucose-Capillary 57 (L) 70 - 99 mg/dL  Glucose, capillary     Status: None   Collection Time: 04/12/14  8:26 AM  Result Value Ref Range   Glucose-Capillary 79 70 - 99 mg/dL  Glucose, capillary     Status: Abnormal   Collection Time: 04/12/14 11:40 AM  Result Value Ref Range   Glucose-Capillary 265 (H) 70 - 99 mg/dL  Clostridium Difficile by PCR     Status: None   Collection Time: 04/12/14 12:37 PM  Result Value Ref Range   C difficile by pcr NEGATIVE NEGATIVE  Glucose, capillary     Status: Abnormal   Collection Time: 04/12/14  5:25 PM  Result Value Ref Range   Glucose-Capillary 120 (H) 70 - 99 mg/dL  Glucose, capillary     Status: Abnormal   Collection Time: 04/12/14  8:44 PM  Result Value Ref Range   Glucose-Capillary 176 (H) 70 - 99 mg/dL  Basic metabolic panel     Status: Abnormal   Collection Time: 04/13/14  5:21 AM  Result Value Ref Range   Sodium 141 135 - 145 mmol/L   Potassium 3.6 3.5 - 5.1 mmol/L   Chloride 118 (H) 96 - 112 mmol/L   CO2 16 (L) 19 - 32 mmol/L   Glucose, Bld 75 70 - 99 mg/dL    BUN 14 6 - 23 mg/dL   Creatinine, Ser 1.05 0.50 - 1.10 mg/dL   Calcium 8.7 8.4 - 10.5 mg/dL   GFR calc non Af Amer 54 (L) >90 mL/min   GFR calc Af Amer 63 (L) >90 mL/min    Comment: (NOTE) The eGFR has been calculated using the CKD EPI equation. This calculation has not been validated in all clinical situations. eGFR's persistently <90 mL/min signify possible Chronic Kidney Disease.    Anion gap 7 5 - 15  CBC     Status: Abnormal   Collection Time: 04/13/14  5:21 AM  Result Value Ref Range   WBC 5.6 4.0 - 10.5 K/uL   RBC 2.86 (L) 3.87 - 5.11 MIL/uL   Hemoglobin 8.8 (L) 12.0 - 15.0 g/dL    Comment: REPEATED TO VERIFY   HCT 26.0 (L) 36.0 - 46.0 %   MCV 90.9 78.0 - 100.0 fL   MCH 30.8 26.0 - 34.0 pg   MCHC 33.8 30.0 - 36.0 g/dL   RDW 15.9 (H) 11.5 - 15.5 %   Platelets 104 (L) 150 - 400 K/uL    Comment: REPEATED TO VERIFY CONSISTENT WITH PREVIOUS RESULT   Glucose, capillary     Status: Abnormal   Collection Time: 04/13/14  8:05 AM  Result Value Ref Range   Glucose-Capillary 41 (LL) 70 - 99 mg/dL  Glucose, capillary  Status: None   Collection Time: 04/13/14  8:41 AM  Result Value Ref Range   Glucose-Capillary 71 70 - 99 mg/dL  Glucose, capillary     Status: Abnormal   Collection Time: 04/13/14 11:45 AM  Result Value Ref Range   Glucose-Capillary 267 (H) 70 - 99 mg/dL  Vancomycin, trough     Status: Abnormal   Collection Time: 04/13/14 12:12 PM  Result Value Ref Range   Vancomycin Tr 7.6 (L) 10.0 - 20.0 ug/mL  Glucose, capillary     Status: Abnormal   Collection Time: 04/13/14  4:45 PM  Result Value Ref Range   Glucose-Capillary 145 (H) 70 - 99 mg/dL  Glucose, capillary     Status: Abnormal   Collection Time: 04/13/14  9:51 PM  Result Value Ref Range   Glucose-Capillary 128 (H) 70 - 99 mg/dL  CBC     Status: Abnormal   Collection Time: 04/14/14  5:14 AM  Result Value Ref Range   WBC 5.5 4.0 - 10.5 K/uL   RBC 2.86 (L) 3.87 - 5.11 MIL/uL   Hemoglobin 8.7 (L) 12.0 -  15.0 g/dL   HCT 25.6 (L) 36.0 - 46.0 %   MCV 89.5 78.0 - 100.0 fL   MCH 30.4 26.0 - 34.0 pg   MCHC 34.0 30.0 - 36.0 g/dL   RDW 16.1 (H) 11.5 - 15.5 %   Platelets 133 (L) 150 - 400 K/uL  Basic metabolic panel     Status: Abnormal   Collection Time: 04/14/14  5:14 AM  Result Value Ref Range   Sodium 141 135 - 145 mmol/L   Potassium 3.0 (L) 3.5 - 5.1 mmol/L   Chloride 117 (H) 96 - 112 mmol/L   CO2 20 19 - 32 mmol/L   Glucose, Bld 61 (L) 70 - 99 mg/dL   BUN 10 6 - 23 mg/dL   Creatinine, Ser 0.88 0.50 - 1.10 mg/dL   Calcium 8.5 8.4 - 10.5 mg/dL   GFR calc non Af Amer 67 (L) >90 mL/min   GFR calc Af Amer 78 (L) >90 mL/min    Comment: (NOTE) The eGFR has been calculated using the CKD EPI equation. This calculation has not been validated in all clinical situations. eGFR's persistently <90 mL/min signify possible Chronic Kidney Disease.    Anion gap 4 (L) 5 - 15  Magnesium     Status: None   Collection Time: 04/14/14  5:14 AM  Result Value Ref Range   Magnesium 1.8 1.5 - 2.5 mg/dL  Glucose, capillary     Status: Abnormal   Collection Time: 04/14/14  8:25 AM  Result Value Ref Range   Glucose-Capillary 36 (LL) 70 - 99 mg/dL   Comment 1 Notify RN   Glucose, capillary     Status: Abnormal   Collection Time: 04/14/14  9:33 AM  Result Value Ref Range   Glucose-Capillary 110 (H) 70 - 99 mg/dL  Glucose, capillary     Status: Abnormal   Collection Time: 04/14/14 11:56 AM  Result Value Ref Range   Glucose-Capillary 104 (H) 70 - 99 mg/dL  Glucose, capillary     Status: Abnormal   Collection Time: 04/14/14  5:21 PM  Result Value Ref Range   Glucose-Capillary 106 (H) 70 - 99 mg/dL  Glucose, capillary     Status: Abnormal   Collection Time: 04/14/14 10:03 PM  Result Value Ref Range   Glucose-Capillary 348 (H) 70 - 99 mg/dL   Comment 1 Notify RN  Comment 2 Documented in Chart   Basic metabolic panel     Status: Abnormal   Collection Time: 04/15/14  5:28 AM  Result Value Ref Range    Sodium 136 135 - 145 mmol/L   Potassium 3.8 3.5 - 5.1 mmol/L    Comment: DELTA CHECK NOTED   Chloride 113 (H) 96 - 112 mmol/L   CO2 17 (L) 19 - 32 mmol/L   Glucose, Bld 349 (H) 70 - 99 mg/dL   BUN 13 6 - 23 mg/dL   Creatinine, Ser 0.99 0.50 - 1.10 mg/dL   Calcium 8.6 8.4 - 10.5 mg/dL   GFR calc non Af Amer 58 (L) >90 mL/min   GFR calc Af Amer 67 (L) >90 mL/min    Comment: (NOTE) The eGFR has been calculated using the CKD EPI equation. This calculation has not been validated in all clinical situations. eGFR's persistently <90 mL/min signify possible Chronic Kidney Disease.    Anion gap 6 5 - 15  Glucose, capillary     Status: Abnormal   Collection Time: 04/15/14  7:57 AM  Result Value Ref Range   Glucose-Capillary 230 (H) 70 - 99 mg/dL  Glucose, capillary     Status: Abnormal   Collection Time: 04/15/14 12:04 PM  Result Value Ref Range   Glucose-Capillary 325 (H) 70 - 99 mg/dL      Constitutional:  BP 125/78 mmHg  Pulse 63  Ht _0  (1.575 m)  Wt 88 lb 9.6 oz (40.189 kg)  BMI 16.20 kg/m2   Mental Status Examination;  Patient is casually dressed and fairly groomed.  She is a poor historian and appears very anxious and nervous.  She maintained poor eye contact.  Her speech is slow and she has thought blocking.  She usually answer yes or no.  She described her mood nervous and anxious and her affect is constricted.  Her psychomotor activity is decreased.  She denies any active or passive suicidal parts or homicidal thought.  There were no delusions or any paranoia.  Her fund of knowledge is below average.  She has difficulty remembering things.  Her attention concentration is distracted.  She denies any auditory or visual hallucination.  Her cognition is okay but she does have difficulty remembering recent things.  Her gait is unsteady and shuffling.  She's alert and oriented 3.  Her insight judgment and impulse control is fair.   New problem, with additional work up planned,  Review of Psycho-Social Stressors (1), Review or order clinical lab tests (1), Decision to obtain old records (1), Review and summation of old records (2), Established Problem, Worsening (2), New Problem, with no additional work-up planned (3), Review of Medication Regimen & Side Effects (2) and Review of New Medication or Change in Dosage (2)  Assessment: Axis I:  Anxiety disorder NOS, rule out depressive disorder single episode.  Axis II:  deferred  Axis III:  Past Medical History  Diagnosis Date  . Depression   . HTN (hypertension)   . Chronic pain   . Fibromyalgia   . Migraines   . History of breast cancer     Right  . Chronic pancreatitis   . Vitamin D deficiency   . GERD (gastroesophageal reflux disease)   . Finger dislocation 2009    L 3rd  . COPD (chronic obstructive pulmonary disease)   . Anemia   . Fractured sternum 11/2008  . Osteoporosis     Reclast too expensive  . Barrett's esophagus   .  Chronic fatigue   . Diverticulosis   . Opioid dependence   . 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 Abnormality of gait 10/12/2013  . Anxiety   . Depression   . Convulsions/seizures 10/12/2013    Possible benzodiazepine withdrawal seizure, x1-no further seizure activity   . Cancer     HX OF BREAST CANCER -chemo, radiation  . Type II or unspecified type diabetes mellitus without mention of complication, not stated as uncontrolled      Plan:   I review her symptoms, history, collateral information and her current medication.  She is taking Zoloft 25 mg however her symptoms are not getting better.  I will discontinue Zoloft and try Remeron 7.5 mg at bedtime to help the insomnia, increased appetite and anxiety symptoms.  If patient tolerated her medication we will consider increasing the dose.  I do believe patient requires counseling and therapy and we will schedule appointment with Joaquim Lai in this office.  Discussed medication side effects and benefits.  Recommended to call us back if she has  any question, concern if she feels worsening of the symptom.  Follow-up in 3 weeks.Time spent 55 minutes.  More than 50% of the time spent in psychoeducation, counseling and coordination of care.  Discuss safety plan that anytime having active suicidal thoughts or homicidal thoughts then patient need to call 911 or go to the local emergency room.    Avonne Berkery T., MD 05/17/2014

## 2014-05-28 ENCOUNTER — Other Ambulatory Visit: Payer: Self-pay | Admitting: Physician Assistant

## 2014-05-31 ENCOUNTER — Other Ambulatory Visit: Payer: Self-pay | Admitting: Physician Assistant

## 2014-06-03 ENCOUNTER — Other Ambulatory Visit: Payer: Self-pay

## 2014-06-03 ENCOUNTER — Encounter (HOSPITAL_COMMUNITY): Payer: Self-pay | Admitting: Clinical

## 2014-06-03 ENCOUNTER — Ambulatory Visit (INDEPENDENT_AMBULATORY_CARE_PROVIDER_SITE_OTHER): Payer: No Typology Code available for payment source | Admitting: Clinical

## 2014-06-03 DIAGNOSIS — F4310 Post-traumatic stress disorder, unspecified: Secondary | ICD-10-CM

## 2014-06-03 DIAGNOSIS — F322 Major depressive disorder, single episode, severe without psychotic features: Secondary | ICD-10-CM | POA: Diagnosis not present
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
## 2014-06-07 ENCOUNTER — Other Ambulatory Visit (HOSPITAL_COMMUNITY): Payer: Self-pay | Admitting: Psychiatry

## 2014-06-10 ENCOUNTER — Ambulatory Visit (INDEPENDENT_AMBULATORY_CARE_PROVIDER_SITE_OTHER): Payer: Medicare Other | Admitting: Psychiatry

## 2014-06-10 ENCOUNTER — Encounter (HOSPITAL_COMMUNITY): Payer: Self-pay | Admitting: Psychiatry

## 2014-06-10 VITALS — BP 162/100 | HR 72 | Ht 62.0 in | Wt 91.2 lb

## 2014-06-10 DIAGNOSIS — F411 Generalized anxiety disorder: Secondary | ICD-10-CM

## 2014-06-10 MED ORDER — ESCITALOPRAM OXALATE 10 MG PO TABS: 10.0000 mg | ORAL_TABLET | Freq: Every day | ORAL | Status: DC

## 2014-06-10 NOTE — Progress Notes (Signed)
[facility] [phone] Progress Note   BRIAN KOCOUREK [id number] 69 y.o.  06/10/2014 11:03 AM  Chief Complaint:  The medicine is not working.  I still feel anxious depressed and I cannot sleep.    History of Present Illness:  Rennie is a 69 year old Caucasian female who was seen first time on March 7 as initial evaluation.  She was referred from her primary care physician Dr. Leta Jungling due to severe depression and anxiety symptoms.  Patient is a poor historian and most of the information was obtained from her primary care giver.  We had a started Remeron on the last visit and discontinued Zoloft.  Her caregiver mention that she is not doing very well.  She continues to have irritability, anger, poor sleep and now she started having more mood swings.  Though she has more weight gain and her appetite is improved from the past but she remains very withdrawn and isolated.  She keep her room dark and gets easily irritable and does not want to leave her home.  She started seeing therapist in this office.  However she did not talk that much in the session.  Her caregiver mention that most of the time she sleeping during the day and does not want to do anything's.  She is taking multiple medication including Lyrica, Topamax, Namenda, Ativan , Zofran and Zomig .  Patient has a lot of health issues.  She was seen by neurologist and there is a presumptive diagnosis of Parkinson however she has not given any treatment so far.  She is waiting to see the neurologist and appointment has been scheduled in June.  Patient continues to endorse nervousness and anxiety and requesting more medication to help her sleep.  She is requesting pain medication which was discontinued.  Months ago because she was abusing her OxyContin and oxycodone.  She has multiple somatic complaints including headaches, stomachache, chronic pain, lethargy and fatigue.  However patient denies any crying spells, self-injurious behavior, active  or passive suicidal thoughts or homicidal thought.  Her attention concentration is poor.  She has difficulty remembering things.  She was given Zoloft which was discontinued because it was not helping her.  She has mildly hypertensive which she believed due to anxiety.  Overall her appetite is improved from the past.  Patient denies drinking or using any illegal substances.  Suicidal Ideation: No Plan Formed: No Patient has means to carry out plan: No  Homicidal Ideation: No Plan Formed: No Patient has means to carry out plan: No  Past Psychiatric History/Hospitalization(s) Patient and her caregiver denies any previous history of psychiatric inpatient treatment or any suicidal attempt.  As per chart patient has given Celexa, amitriptyline , Brintellix and Zoloft.    Recently we tried Remeron but patient did not see any improvement. Anxiety: Yes Bipolar Disorder: No Depression: Yes Mania: No Psychosis: No Schizophrenia: No Personality Disorder: No Hospitalization for psychiatric illness: No History of Electroconvulsive Shock Therapy: No Prior Suicide Attempts: No  Past Medical History  Diagnosis Date  . Depression   . HTN (hypertension)   . Chronic pain   . Fibromyalgia   . Migraines   . History of breast cancer     Right  . Chronic pancreatitis   . Vitamin D deficiency   . GERD (gastroesophageal reflux disease)   . Finger dislocation 2009    L 3rd  . COPD (chronic obstructive pulmonary disease)   . Anemia   . Fractured sternum 11/2008  .  Osteoporosis     Reclast too expensive  . Barrett's esophagus   . Chronic fatigue   . Diverticulosis   . Opioid dependence   . 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 Abnormality of gait 10/12/2013  . Anxiety   . Depression   . Convulsions/seizures 10/12/2013    Possible benzodiazepine withdrawal seizure, x1-no further seizure activity   . Cancer     HX OF BREAST CANCER -chemo, radiation  . Type II or unspecified type diabetes mellitus without mention  of complication, not stated as uncontrolled     Review of Systems  Constitutional: Positive for malaise/fatigue.  Psychiatric/Behavioral: Positive for depression. Negative for hallucinations and substance abuse. The patient is nervous/anxious and has insomnia.     Psychiatric: Agitation: Irritability Hallucination: No Depressed Mood: Yes Insomnia: Yes Hypersomnia: No Altered Concentration: No Feels Worthless: Yes Grandiose Ideas: No Belief In Special Powers: No New/Increased Substance Abuse: No Compulsions: No  Neurologic: Headache: Yes Seizure: No Paresthesias: No   Musculoskeletal: Strength & Muscle Tone: spastic, decreased and atrophy Gait & Station: unsteady, shuffle Patient leans: Front and Backward  Constitutional:  BP 161/95 mmHg  Pulse 68  Ht 5\' 2"  (1.575 m)  Wt 91 lb 3.2 oz (41.368 kg)  BMI 16.68 kg/m2   Mental Status Examination;  Patient is casually dressed and fairly groomed.  She is a poor historian and appears anxious and nervous.  She maintained poor eye contact.  Her speech is slow and she has thought blocking.  She usually answer yes or no.  She described her mood nervous and anxious and her affect is constricted.  Her psychomotor activity is decreased.  She denies any active or passive suicidal parts or homicidal thought.  There were no delusions or any paranoia.  Her fund of knowledge is below average.  She has difficulty remembering things.  Her attention concentration is distracted.  She denies any auditory or visual hallucination.  Her cognition is okay but she does have difficulty remembering recent things.  Her gait is unsteady and shuffling.  She's alert and oriented 3.  Her insight judgment and impulse control is fair.   New problem, with additional work up planned, Review of Psycho-Social Stressors (1), Discuss test with performing physician (1), Review and summation of old records (2), Established Problem, Worsening (2), Review of Last Therapy  Session (1), Review of Medication Regimen & Side Effects (2) and Review of New Medication or Change in Dosage (2)  Assessment: Axis I:  Anxiety disorder NOS, rule out depressive disorder single episode.  Axis II:  deferred  Axis III:  Past Medical History  Diagnosis Date  . Depression   . HTN (hypertension)   . Chronic pain   . Fibromyalgia   . Migraines   . History of breast cancer     Right  . Chronic pancreatitis   . Vitamin D deficiency   . GERD (gastroesophageal reflux disease)   . Finger dislocation 2009    L 3rd  . COPD (chronic obstructive pulmonary disease)   . Anemia   . Fractured sternum 11/2008  . Osteoporosis     Reclast too expensive  . Barrett's esophagus   . Chronic fatigue   . Diverticulosis   . Opioid dependence   . 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 Abnormality of gait 10/12/2013  . Anxiety   . Depression   . Convulsions/seizures 10/12/2013    Possible benzodiazepine withdrawal seizure, x1-no further seizure activity   . Cancer     HX OF  BREAST CANCER -chemo, radiation  . Type II or unspecified type diabetes mellitus without mention of complication, not stated as uncontrolled      Plan:   I will discontinue Remeron since patient does not see any improvement.  We will try Lexapro 10 mg to help anxiety and depressive symptoms.  I also talk to her primary care physician Dr. Leta Jungling has patient is taking various medication.  We talked about that patient need to be seen by Dr. Westley Gambles his neurologist for the management of Parkinson disease.  We talked about comorbidity with Parkinson and depression.  I will defer adding Seroquel Risperdal or any antipsychotic medication at this time because it may cause worsening of Parkinson symptoms.  Her primary care physician and we will try to get expedite appointment to see neurologist.  I recommended to call us back if she has any question or any concern.  Encouraged to keep appointment with a therapist in this office for  coping and social skills.  I will see her again in 3 weeks.  Time spent 25 minutes.  More than 50% of the time spent in psychoeducation, counseling and coordination of care.  Discuss safety plan that anytime having active suicidal thoughts or homicidal thoughts then patient need to call 911 or go to the local emergency room.    Aadith Raudenbush T., MD 06/10/2014

## 2014-06-11 ENCOUNTER — Encounter (HOSPITAL_COMMUNITY): Payer: Self-pay | Admitting: Clinical

## 2014-06-11 ENCOUNTER — Ambulatory Visit (INDEPENDENT_AMBULATORY_CARE_PROVIDER_SITE_OTHER): Payer: No Typology Code available for payment source | Admitting: Clinical

## 2014-06-11 DIAGNOSIS — F322 Major depressive disorder, single episode, severe without psychotic features: Secondary | ICD-10-CM | POA: Diagnosis not present

## 2014-06-11 DIAGNOSIS — F4310 Post-traumatic stress disorder, unspecified: Secondary | ICD-10-CM
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
## 2014-06-15 ENCOUNTER — Encounter: Payer: Self-pay | Admitting: Neurology

## 2014-06-15 ENCOUNTER — Ambulatory Visit (INDEPENDENT_AMBULATORY_CARE_PROVIDER_SITE_OTHER): Payer: Medicare Other | Admitting: Neurology

## 2014-06-15 VITALS — BP 140/83 | HR 69 | Ht 62.0 in | Wt 92.4 lb

## 2014-06-15 DIAGNOSIS — G20 Parkinson's disease: Secondary | ICD-10-CM

## 2014-06-15 DIAGNOSIS — R413 Other amnesia: Secondary | ICD-10-CM | POA: Diagnosis not present

## 2014-06-15 DIAGNOSIS — R269 Unspecified abnormalities of gait and mobility: Secondary | ICD-10-CM | POA: Diagnosis not present

## 2014-06-15 DIAGNOSIS — G20A1 Parkinson's disease without dyskinesia, without mention of fluctuations: Secondary | ICD-10-CM

## 2014-06-15 HISTORY — DX: Parkinson's disease without dyskinesia, without mention of fluctuations: G20.A1

## 2014-06-15 HISTORY — DX: Parkinson's disease: G20

## 2014-06-15 MED ORDER — PREGABALIN 50 MG PO CAPS: 50.0000 mg | ORAL_CAPSULE | Freq: Two times a day (BID) | ORAL | Status: DC

## 2014-06-15 NOTE — Progress Notes (Signed)
Reason for visit: Gait disorder  Lindsey Gomez is an 69 y.o. female  History of present illness:  Lindsey Gomez is a 69 year old right-handed [place] female with a history of chronic daily headaches. The patient is getting Botox injections with some benefit. She is on Topamax taking 25 mg twice daily, but she has had issues with weight loss. The patient is on Lyrica 25 mg twice daily for back pain. She takes Midrin and Zomig for her headaches at times. The patient usually takes Tylenol on a daily basis. Her headaches usually come on early in the morning. The patient does have a memory issue that is mild, and this has not progressed much over time. There have been some issues with her walking, the patient is developing a shuffling gait, decreased arm swing. The patient is felt to have parkinsonism, with possible Parkinson's disease evolving. The patient reports no falls. She does not use a cane or a walker for ambulation. The patient is extremely inactive during the day, the patient will get up and eat, and she spends the rest of the day on the couch or in bed. The patient reports some ongoing issues with abdominal discomfort.  Past Medical History  Diagnosis Date  . Depression   . HTN (hypertension)   . Chronic pain   . Fibromyalgia   . Migraines   . History of breast cancer     Right  . Chronic pancreatitis   . Vitamin D deficiency   . GERD (gastroesophageal reflux disease)   . Finger dislocation 2009    L 3rd  . COPD (chronic obstructive pulmonary disease)   . Anemia   . Fractured sternum 11/2008  . Osteoporosis     Reclast too expensive  . Barrett's esophagus   . Chronic fatigue   . Diverticulosis   . Opioid dependence   . 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 Abnormality of gait 10/12/2013  . Anxiety   . Depression   . Convulsions/seizures 10/12/2013    Possible benzodiazepine withdrawal seizure, x1-no further seizure activity   . Cancer     HX OF BREAST CANCER -chemo, radiation  . Type  II or unspecified type diabetes mellitus without mention of complication, not stated as uncontrolled   . Pancreatitis   . Hypoglycemia   . Lung nodules   . Chronic kidney disease   . Parkinson disease   . Parkinson's disease 06/15/2014    Past Surgical History  Procedure Laterality Date  . Nasal sinus surgery    . Pancreatectomy      40%  . Cholecystectomy    . Appendectomy    . Abdominal hysterectomy    . Lobectomy Left 2005    granulomatous lesion.   . Breast lumpectomy Left   . Ercp N/A 10/05/2013    Procedure: ENDOSCOPIC RETROGRADE CHOLANGIOPANCREATOGRAPHY (ERCP);  Surgeon: Ladene Artist, MD;  Location: [facility] END[facility]COPY;  Service: Endoscopy;  Laterality: N/A;  . Cataract extraction Bilateral   . Botox injection      recent botox injection 12-23-13 injection for Migraines  . Cataract extraction, bilateral Bilateral     bilateral  . Eye surgery      1 eye "membrane surgery"  . Ercp N/A 01/07/2014    Procedure: ENDOSCOPIC RETROGRADE CHOLANGIOPANCREATOGRAPHY (ERCP);  Surgeon: Inda Castle, MD;  Location: [facility];  Service: Endoscopy;  Laterality: N/A;  . Spyglass cholangioscopy N/A 01/07/2014    Procedure: PJASNKNL CHOLANGIOSCOPY;  Surgeon: Inda Castle, MD;  Location: [facility] ENDOSCOPY;  Service: Endoscopy;  Laterality: N/A;    Family History  Problem Relation Age of Onset  . COPD Father   . Heart disease Father   . CAD Father   . Leukemia Mother   . Diabetes Maternal Uncle   . Dementia Sister   . Alzheimer's disease Sister   . Heart attack Brother   . Colon cancer Neg Hx   . Colon polyps Neg Hx     Social history:  reports that she quit smoking about 4 months ago. Her smoking use included Cigarettes. She has a 40 pack-year smoking history. She has never used smokeless tobacco. She reports that she does not drink alcohol or use illicit drugs.    Allergies  Allergen Reactions  . Milnacipran Swelling and Other (See Comments)    Headache and hand swelling   .  Rizatriptan Benzoate Nausea And Vomiting  . Gabapentin Other (See Comments)    Weak muscles  . Moxifloxacin Other (See Comments)    Unknown  . Sulfonamide Derivatives Rash  . Venlafaxine Other (See Comments)    Unknown    Medications:  Prior to Admission medications   Medication Sig Start Date End Date Taking? Authorizing Provider  acetaminophen (TYLENOL) 500 MG tablet Take 500 mg by mouth every 6 (six) hours as needed.   Yes Historical Provider, MD  atenolol (TENORMIN) 100 MG tablet Take 1 tablet (100 mg total) by mouth every morning. 05/15/13  Yes Aleksei Plotnikov V, MD  atorvastatin (LIPITOR) 10 MG tablet  06/03/14  Yes Historical Provider, MD  Botulinum Toxin Type A 200 UNITS SOLR Inject 200 Units as directed every 3 (three) months.   Yes Historical Provider, MD  cyanocobalamin 500 MCG tablet Take 500 mcg by mouth daily.   Yes Historical Provider, MD  dicyclomine (BENTYL) 10 MG capsule TAKE 1 CAPSULE (10 MG TOTAL) BY MOUTH 3 (THREE) TIMES DAILY BEFORE MEALS. 05/31/14  Yes Amy S Esterwood, PA-C  docusate sodium (COLACE) 100 MG capsule Take 100 mg by mouth daily as needed for mild constipation.   Yes Historical Provider, MD  escitalopram (LEXAPRO) 10 MG tablet Take 1 tablet (10 mg total) by mouth daily. 06/10/14 06/10/15 Yes Kathlee Nations, MD  ferrous sulfate 325 (65 FE) MG tablet Take 325 mg by mouth daily with breakfast.   Yes Historical Provider, MD  glucagon (GLUCAGON EMERGENCY) 1 MG injection Inject 1 mg into the vein once as needed (for glucose).   Yes Historical Provider, MD  hydrocortisone (CORTEF) 10 MG tablet Take 10 mg by mouth 2 (two) times daily.  03/17/14  Yes Historical Provider, MD  insulin aspart (NOVOLOG) 100 UNIT/ML FlexPen Inject 0-8 Units into the skin 3 (three) times daily with meals. 70-120 = 0 units, 121-150 = 1 unit, 151-200 = 2 units, 201-250 = 3 units, 251-300 = 5 units, 301-350 = 7 units. 01/13/13  Yes Renato Shin, MD  isometheptene-acetaminophen-dichloralphenazone  (MIDRIN) 65-325-100 MG capsule Take 1 capsule by mouth 4 (four) times daily as needed for migraine. Maximum 5 capsules in 12 hours for migraine headaches, 8 capsules in 24 hours for tension headaches.   Yes Historical Provider, MD  LEVEMIR FLEXTOUCH 100 UNIT/ML Pen Inject 11 Units into the skin at bedtime.  03/31/14  Yes Historical Provider, MD  loperamide (IMODIUM) 2 MG capsule Take 2-4 mg by mouth as needed for diarrhea or loose stools.  10/07/13  Yes Historical Provider, MD  LORazepam (ATIVAN) 1 MG tablet Take 1 tablet (1 mg total) by mouth 2 (two)  times daily. 03/15/14  Yes Theodis Blaze, MD  LYRICA 25 MG capsule Take 25 mg by mouth 2 (two) times daily.  03/07/14  Yes Historical Provider, MD  Melatonin 10 MG TABS Take 1 tablet by mouth at bedtime.   Yes Historical Provider, MD  memantine (NAMENDA XR) 7 MG CP24 24 hr capsule Take 7 mg by mouth daily.   Yes Historical Provider, MD  Misc Natural Products ([place] PO) Take by mouth daily.   Yes Historical Provider, MD  mometasone (NASONEX) 50 MCG/ACT nasal spray Place 2 sprays into both nostrils daily as needed (for congestion).    Yes Historical Provider, MD  omeprazole (PRILOSEC) 40 MG capsule Take 1 capsule (40 mg total) by mouth 2 (two) times daily. Patient taking differently: Take 40 mg by mouth daily.  07/17/13  Yes Aleksei Plotnikov V, MD  ondansetron (ZOFRAN) 8 MG tablet Take 1 tablet (8 mg total) by mouth 2 (two) times daily. 02/10/14  Yes Inda Castle, MD  Probiotic Product (PROBIOTIC DAILY PO) Take by mouth daily.   Yes Historical Provider, MD  topiramate (TOPAMAX) 25 MG capsule Take 25 mg by mouth 2 (two) times daily.   Yes Historical Provider, MD  triamcinolone (KENALOG) 0.1 % ointment Apply 1 application topically 2 (two) times daily as needed (itching).    Yes Historical Provider, MD  Vortioxetine HBr (BRINTELLIX) 20 MG TABS Take 20 mg by mouth daily.   Yes Historical Provider, MD  zolmitriptan (ZOMIG) 5 MG tablet Take 1  tablet (5 mg total) by mouth 2 (two) times daily as needed for migraine. Patient taking differently: Take 5 mg by mouth daily as needed for migraine.  03/18/14  Yes Kathrynn Ducking, MD    ROS:  Out of a complete 14 system review of symptoms, the patient complains only of the following symptoms, and all other reviewed systems are negative.  Activity change Light sensitivity Cold intolerance, heat intolerance Memory loss, headache, weakness Agitation, depression, anxiety  Blood pressure 140/83, pulse 69, height 5\' 2"  (1.575 m), weight 92 lb 6.4 oz (41.912 kg).  Physical Exam  General: The patient is alert and cooperative at the time of the examination.  Skin: No significant peripheral edema is noted.   Neurologic Exam  Mental status: The patient is alert and oriented x 3 at the time of the examination. The patient has apparent normal recent and remote memory, with an apparently normal attention span and concentration ability. Mini-Mental Status Examination done today shows a total score 29/30.   Cranial nerves: Facial symmetry is present. Speech is normal, no aphasia or dysarthria is noted. Extraocular movements are full. Visual fields are full. Masking of the face is seen.  Motor: The patient has good strength in all 4 extremities.  Sensory examination: Soft touch sensation is symmetric on the face, arms, and legs.  Coordination: The patient has good finger-nose-finger and heel-to-shin bilaterally.  Gait and station: The patient has a slight shuffling type gait, slight stooped posture, decreased arm swing. The patient is able to ambulate without assistance. She appears to have good turns. Tandem gait is unsteady, Romberg is negative.  Reflexes: Deep tendon reflexes are symmetric.   Assessment/Plan:  1. Gait disorder, parkinsonism  2. Chronic daily headache  3. Mild memory disorder  The patient is developing a mild gait disorder that could be associated with Parkinson's  disease. The patient will be followed for this, we may need to start Sinemet on her next visit in  June 2016. The patient is encouraged to stay more active, she is spending most of her day sitting or lying down. The patient will be increased slightly on the Lyrica for the headache taking 50 mg twice daily. She is having issues maintaining her weight, and we likely cannot go up higher on the Topamax. The memory issues are relatively mild, they will be followed over time.  Jill Alexanders MD 06/15/2014 8:45 PM  [facility] [address] [place], [place] [address]  Phone [phone] Fax [phone]

## 2014-06-15 NOTE — Patient Instructions (Signed)

## 2014-06-24 ENCOUNTER — Ambulatory Visit (INDEPENDENT_AMBULATORY_CARE_PROVIDER_SITE_OTHER): Payer: Medicare Other | Admitting: Gastroenterology

## 2014-06-24 ENCOUNTER — Encounter: Payer: Self-pay | Admitting: Gastroenterology

## 2014-06-24 VITALS — BP 124/78 | HR 72 | Ht 61.0 in

## 2014-06-24 DIAGNOSIS — G8929 Other chronic pain: Secondary | ICD-10-CM | POA: Diagnosis not present

## 2014-06-24 DIAGNOSIS — R1031 Right lower quadrant pain: Secondary | ICD-10-CM | POA: Diagnosis not present

## 2014-06-24 MED ORDER — SULINDAC 200 MG PO TABS: ORAL_TABLET | ORAL | Status: DC

## 2014-06-24 MED ORDER — NA SULFATE-K SULFATE-MG SULF 17.5-3.13-1.6 GM/177ML PO SOLN: 1.0000 | Freq: Once | ORAL | Status: DC

## 2014-06-24 MED ORDER — DICYCLOMINE HCL 10 MG PO CAPS: 10.0000 mg | ORAL_CAPSULE | Freq: Three times a day (TID) | ORAL | Status: DC

## 2014-06-24 NOTE — Progress Notes (Signed)
      History of Present Illness:  Lindsey Gomez has returned for evaluation of lower abdominal pain.  For the past 3 weeks she has been complaining of fairly constant pain in the right lower quadrant.  It is unaffected by eating or moving her bowels.  Worsened by twisting or turning.  She denies change of bowel habits, fever or nausea.  At her last office visit in November, 2015 she was complaining of diarrhea.  This has entirely subsided.    Review of Systems: Pertinent positive and negative review of systems were noted in the above HPI section. All other review of systems were otherwise negative.    Current Medications, Allergies, Past Medical History, Past Surgical History, Family History and Social History were reviewed in [facility] record  Vital signs were reviewed in today's medical record. Physical Exam: General: Thin, chronically ill-appearing in no acute distress Skin: anicteric Head: Normocephalic and atraumatic Eyes:  sclerae anicteric, EOMI Ears: Normal auditory acuity Mouth: No deformity or lesions Lungs: Clear throughout to auscultation Heart: Regular rate and rhythm; no murmurs, rubs or bruits Abdomen: Soft, and non distended. No masses, hepatosplenomegaly or hernias noted. Normal Bowel sounds.  There is mild tenderness to deep palpation in the right lower quadrant in the right inguinal area.  Tenderness increases with abdominal muscle wall flexion Rectal:deferred Musculoskeletal: Symmetrical with no gross deformities  Pulses:  Normal pulses noted Extremities: No clubbing, cyanosis, edema or deformities noted Neurological: Alert oriented x 4, grossly nonfocal Psychological:  Alert and cooperative. Normal mood and affect  See Assessment and Plan under Problem List

## 2014-06-24 NOTE — Assessment & Plan Note (Signed)
3 week history of fairly constant lower abdominal pain exam pertinent for tenderness in the inguinal area worsened with abdominal muscle wall flexion suggest abdominal wall pain.  There is no frank hernia.  So pain is less likely.  Recommendations #1 trial of Clinoril 200 mg twice a day for 10 days then as needed

## 2014-06-24 NOTE — Patient Instructions (Addendum)
You have been scheduled for a colonoscopy. Please follow written instructions given to you at your visit today.  Please pick up your prep supplies at the pharmacy within the next 1-3 days. If you use inhalers (even only as needed), please bring them with you on the day of your procedure. Your physician has requested that you go to www.startemmi.com and enter the access code given to you at your visit today. This web site gives a general overview about your procedure. However, you should still follow specific instructions given to you by our office regarding your preparation for the procedure.  We have cancelled your previsit appointment since you got all your instructions during your office appointment  As requested we sent in a 90 day supply of your Bentyl to [facility]

## 2014-06-28 ENCOUNTER — Telehealth: Payer: Self-pay | Admitting: Gastroenterology

## 2014-06-28 ENCOUNTER — Other Ambulatory Visit: Payer: Self-pay | Admitting: Gastroenterology

## 2014-06-29 NOTE — Telephone Encounter (Signed)
Ok

## 2014-06-29 NOTE — Telephone Encounter (Signed)
Abdominal muscle are still a little tender. Care giver is not certain that Clinoril was helping before the diarrhea started. Diarrhea is improving since stopping Clinoril.

## 2014-06-30 ENCOUNTER — Telehealth: Payer: Self-pay | Admitting: *Deleted

## 2014-06-30 ENCOUNTER — Ambulatory Visit (HOSPITAL_COMMUNITY): Payer: Self-pay | Admitting: Clinical

## 2014-06-30 NOTE — Telephone Encounter (Signed)
PRIOR AUTH COMPLETED FOR PT. APPROVED UNTIL 06/30/2015 PHARMACY AND PT INFORMED

## 2014-07-01 ENCOUNTER — Ambulatory Visit (HOSPITAL_COMMUNITY): Payer: Self-pay | Admitting: Psychiatry

## 2014-07-01 ENCOUNTER — Ambulatory Visit (INDEPENDENT_AMBULATORY_CARE_PROVIDER_SITE_OTHER): Payer: Medicare Other | Admitting: Psychiatry

## 2014-07-01 ENCOUNTER — Encounter (HOSPITAL_COMMUNITY): Payer: Self-pay | Admitting: Psychiatry

## 2014-07-01 VITALS — BP 136/85 | HR 66 | Ht 62.0 in | Wt 95.4 lb

## 2014-07-01 DIAGNOSIS — F411 Generalized anxiety disorder: Secondary | ICD-10-CM | POA: Diagnosis not present

## 2014-07-01 MED ORDER — ESCITALOPRAM OXALATE 20 MG PO TABS: 20.0000 mg | ORAL_TABLET | Freq: Every day | ORAL | Status: DC

## 2014-07-01 NOTE — Progress Notes (Signed)
[facility] [phone] Progress Note   Lindsey Gomez [id number] 69 y.o.  07/01/2014 2:16 PM  Chief Complaint:  I am feeling better.  I like new medication.      History of Present Illness:  Lindsey Gomez came for her follow-up appointment with her aide.  She is taking Lexapro 10 mg and notice improvement in her anxiety and depression.  Her 8 also notices improvement in her appetite, sleep and anxiety symptoms.  She is more social active but she still have some depressive symptoms.  She was seen by neurologist Dr. Reeves Gomez and diagnosed with Parkinson her medicine has not started yet.  She reported Lexapro helping her sleep and she has gained weight from the past.  Though she remains isolated and sometime close her window and door and does not want to do anything.  She denies any suicidal thoughts.  Her affect is improved from the past.  She still have chronic pain but she is not requesting more pain medication.  She denies any paranoia or any hallucination.  She is taking multiple medication including Topamax, Namenda, Ativan, Zofran, Zomig and Lyrica.  She denies any crying spells or any self-injurious behavior.  Patient denies any drinking or any illegal substance use.  She is not interested in counseling and she tried seeing therapist twice but she believed medicine are more helpful.  Suicidal Ideation: No Plan Formed: No Patient has means to carry out plan: No  Homicidal Ideation: No Plan Formed: No Patient has means to carry out plan: No  Past Psychiatric History/Hospitalization(s) Patient and her caregiver denies any previous history of psychiatric inpatient treatment or any suicidal attempt.  As per chart patient has given Celexa, amitriptyline , Brintellix and Zoloft.    Recently we tried Remeron but patient did not see any improvement. Anxiety: Yes Bipolar Disorder: No Depression: Yes Mania: No Psychosis: No Schizophrenia: No Personality Disorder: No Hospitalization for  psychiatric illness: No History of Electroconvulsive Shock Therapy: No Prior Suicide Attempts: No  Past Medical History  Diagnosis Date  . Depression   . HTN (hypertension)   . Chronic pain   . Fibromyalgia   . Migraines   . History of breast cancer     Right  . Chronic pancreatitis   . Vitamin D deficiency   . GERD (gastroesophageal reflux disease)   . Finger dislocation 2009    L 3rd  . COPD (chronic obstructive pulmonary disease)   . Anemia   . Fractured sternum 11/2008  . Osteoporosis     Reclast too expensive  . Barrett's esophagus   . Chronic fatigue   . Diverticulosis   . Opioid dependence   . 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 Abnormality of gait 10/12/2013  . Anxiety   . Depression   . Convulsions/seizures 10/12/2013    Possible benzodiazepine withdrawal seizure, x1-no further seizure activity   . Cancer     HX OF BREAST CANCER -chemo, radiation  . Type II or unspecified type diabetes mellitus without mention of complication, not stated as uncontrolled   . Pancreatitis   . Hypoglycemia   . Lung nodules   . Chronic kidney disease   . Parkinson disease   . Parkinson's disease 06/15/2014    Review of Systems  Constitutional: Negative for weight loss.  Skin: Negative for itching and rash.  Neurological: Positive for tremors.       Shuffling gait  Psychiatric/Behavioral: Positive for depression. The patient is nervous/anxious.     Psychiatric: Agitation: Irritability  Hallucination: No Depressed Mood: Yes Insomnia: Yes Hypersomnia: No Altered Concentration: No Feels Worthless: Yes Grandiose Ideas: No Belief In Special Powers: No New/Increased Substance Abuse: No Compulsions: No  Neurologic: Headache: Yes Seizure: No Paresthesias: No   Musculoskeletal: Strength & Muscle Tone: spastic, decreased and atrophy Gait & Station: unsteady, shuffle Patient leans: Front and Backward  Constitutional:  BP 136/85 mmHg  Pulse 66  Ht 5\' 2"  (1.575 m)  Wt 95 lb 6.4  oz (43.273 kg)  BMI 17.44 kg/m2   Mental Status Examination;  Patient is casually dressed and fairly groomed.  She smiled and pleasant during conversation.  She remains limited to provide information in most of the information was obtained from home health aid.  She maintained fair eye contact. Her speech is slow and she has thought blocking.  She usually answer yes or no.  She described her mood anxious however her affect is improved from the past. Her psychomotor activity is decreased.  She denies any active or passive suicidal parts or homicidal thought.  There were no delusions or any paranoia.  Her fund of knowledge is below average.  She has difficulty remembering things.  Her attention concentration is fair.  She denies any auditory or visual hallucination.  Her cognition is okay but she does have difficulty remembering recent things.  Her gait is unsteady and shuffling.  She's alert and oriented 3.  Her insight judgment and impulse control is fair.   New problem, with additional work up planned, Review of Psycho-Social Stressors (1), Discuss test with performing physician (1), Review and summation of old records (2), Established Problem, Worsening (2), Review of Last Therapy Session (1), Review of Medication Regimen & Side Effects (2) and Review of New Medication or Change in Dosage (2)  Assessment: Axis I:  Anxiety disorder NOS, rule out depressive disorder single episode.  Axis II:  deferred  Axis III:  Past Medical History  Diagnosis Date  . Depression   . HTN (hypertension)   . Chronic pain   . Fibromyalgia   . Migraines   . History of breast cancer     Right  . Chronic pancreatitis   . Vitamin D deficiency   . GERD (gastroesophageal reflux disease)   . Finger dislocation 2009    L 3rd  . COPD (chronic obstructive pulmonary disease)   . Anemia   . Fractured sternum 11/2008  . Osteoporosis     Reclast too expensive  . Barrett's esophagus   . Chronic fatigue   .  Diverticulosis   . Opioid dependence   . 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 Abnormality of gait 10/12/2013  . Anxiety   . Depression   . Convulsions/seizures 10/12/2013    Possible benzodiazepine withdrawal seizure, x1-no further seizure activity   . Cancer     HX OF BREAST CANCER -chemo, radiation  . Type II or unspecified type diabetes mellitus without mention of complication, not stated as uncontrolled   . Pancreatitis   . Hypoglycemia   . Lung nodules   . Chronic kidney disease   . Parkinson disease   . Parkinson's disease 06/15/2014     Plan:   Patient is no longer taking Remeron and Brintellix .  Recommended to increase Lexapro 20 mg to help with residual symptoms of depression.  I reviewed records and notes from her neurologist and collateral information.  Her depression and anxiety is improved from the past.  We will defer any antipsychotic medication at this time.  Patient is  not interested in counseling.  Recommended to call us back if she has any question or any concern.  Increase Lexapro 20 mg daily.  Discussed medication side effects and benefits.  Follow-up in 2 months. Time spent 25 minutes.  More than 50% of the time spent in psychoeducation, counseling and coordination of care.  Discuss safety plan that anytime having active suicidal thoughts or homicidal thoughts then patient need to call 911 or go to the local emergency room.    Zeppelin Beckstrand T., MD 07/01/2014

## 2014-07-09 ENCOUNTER — Other Ambulatory Visit: Payer: Self-pay

## 2014-07-12 ENCOUNTER — Ambulatory Visit
Admission: RE | Admit: 2014-07-12 | Discharge: 2014-07-12 | Disposition: A | Discharge status: Home / Self Care | Payer: Medicare Other | Source: Ambulatory Visit | Attending: Geriatric Medicine | Admitting: Geriatric Medicine

## 2014-07-12 DIAGNOSIS — R911 Solitary pulmonary nodule: Secondary | ICD-10-CM

## 2014-07-15 ENCOUNTER — Ambulatory Visit: Payer: Self-pay | Admitting: Physical Medicine & Rehabilitation

## 2014-07-15 ENCOUNTER — Ambulatory Visit (AMBULATORY_SURGERY_CENTER): Payer: Medicare Other | Admitting: Gastroenterology

## 2014-07-15 ENCOUNTER — Encounter: Payer: Self-pay | Admitting: Gastroenterology

## 2014-07-15 ENCOUNTER — Other Ambulatory Visit: Payer: Self-pay | Admitting: Gastroenterology

## 2014-07-15 ENCOUNTER — Other Ambulatory Visit: Payer: Self-pay | Admitting: *Deleted

## 2014-07-15 ENCOUNTER — Other Ambulatory Visit (INDEPENDENT_AMBULATORY_CARE_PROVIDER_SITE_OTHER): Payer: Medicare Other

## 2014-07-15 VITALS — BP 116/65 | HR 62 | Temp 98.2°F | Resp 25 | Ht 61.0 in | Wt 95.0 lb

## 2014-07-15 DIAGNOSIS — R1031 Right lower quadrant pain: Secondary | ICD-10-CM

## 2014-07-15 DIAGNOSIS — D122 Benign neoplasm of ascending colon: Secondary | ICD-10-CM

## 2014-07-15 DIAGNOSIS — K831 Obstruction of bile duct: Secondary | ICD-10-CM | POA: Diagnosis not present

## 2014-07-15 DIAGNOSIS — K5730 Diverticulosis of large intestine without perforation or abscess without bleeding: Secondary | ICD-10-CM

## 2014-07-15 LAB — CBC WITH DIFFERENTIAL/PLATELET
Basophils Absolute: 0 10*3/uL (ref 0.0–0.1)
Basophils Relative: 0.4 % (ref 0.0–3.0)
EOS PCT: 0.4 % (ref 0.0–5.0)
Eosinophils Absolute: 0 10*3/uL (ref 0.0–0.7)
HCT: 36.9 % (ref 36.0–46.0)
Hemoglobin: 12.8 g/dL (ref 12.0–15.0)
Lymphocytes Relative: 18.8 % (ref 12.0–46.0)
Lymphs Abs: 1.5 10*3/uL (ref 0.7–4.0)
MCHC: 34.6 g/dL (ref 30.0–36.0)
MCV: 88.5 fl (ref 78.0–100.0)
MONO ABS: 0.4 10*3/uL (ref 0.1–1.0)
MONOS PCT: 4.5 % (ref 3.0–12.0)
NEUTROS PCT: 75.9 % (ref 43.0–77.0)
Neutro Abs: 6 10*3/uL (ref 1.4–7.7)
PLATELETS: 183 10*3/uL (ref 150.0–400.0)
RBC: 4.17 Mil/uL (ref 3.87–5.11)
RDW: 13.9 % (ref 11.5–15.5)
WBC: 8 10*3/uL (ref 4.0–10.5)

## 2014-07-15 LAB — GLUCOSE, CAPILLARY
Glucose-Capillary: 180 mg/dL — ABNORMAL HIGH (ref 70–99)
Glucose-Capillary: 167 mg/dL — ABNORMAL HIGH (ref 70–99)

## 2014-07-15 LAB — HEPATIC FUNCTION PANEL
ALK PHOS: 86 U/L (ref 39–117)
ALT: 44 U/L — AB (ref 0–35)
AST: 32 U/L (ref 0–37)
Albumin: 3.7 g/dL (ref 3.5–5.2)
BILIRUBIN DIRECT: 0.1 mg/dL (ref 0.0–0.3)
BILIRUBIN TOTAL: 0.4 mg/dL (ref 0.2–1.2)
TOTAL PROTEIN: 7.6 g/dL (ref 6.0–8.3)

## 2014-07-15 MED ORDER — FLEET ENEMA 7-19 GM/118ML RE ENEM
1.0000 | ENEMA | Freq: Once | RECTAL | Status: AC
  Administered 2014-07-15 (×2): 1 via RECTAL

## 2014-07-15 MED ORDER — SODIUM CHLORIDE 0.9 % IV SOLN: 500.0000 mL | INTRAVENOUS | Status: DC

## 2014-07-15 MED ORDER — FLEET ENEMA 7-19 GM/118ML RE ENEM: 1.0000 | ENEMA | Freq: Once | RECTAL | Status: DC

## 2014-07-15 NOTE — Progress Notes (Signed)
Two fleets enemas given, 1st results dark brown liquid, translucent. 2nd enema clear results.

## 2014-07-15 NOTE — Progress Notes (Signed)
Patient and caregiver, Lindsey Gomez stating patient completed prep yet continues to go to restroom q 15 minutes with cloudy brown results. Discussed with Dr. Deatra Ina fleets enema ordered.

## 2014-07-15 NOTE — Patient Instructions (Signed)
YOU HAD AN ENDOSCOPIC PROCEDURE TODAY AT [facility]:   Refer to the procedure report that was given to you for any specific questions about what was found during the examination.  If the procedure report does not answer your questions, please call your gastroenterologist to clarify.  If you requested that your care partner not be given the details of your procedure findings, then the procedure report has been included in a sealed envelope for you to review at your convenience later.  YOU SHOULD EXPECT: Some feelings of bloating in the abdomen. Passage of more gas than usual.  Walking can help get rid of the air that was put into your GI tract during the procedure and reduce the bloating. If you had a lower endoscopy (such as a colonoscopy or flexible sigmoidoscopy) you may notice spotting of blood in your stool or on the toilet paper. If you underwent a bowel prep for your procedure, you may not have a normal bowel movement for a few days.  Please Note:  You might notice some irritation and congestion in your nose or some drainage.  This is from the oxygen used during your procedure.  There is no need for concern and it should clear up in a day or so.  SYMPTOMS TO REPORT IMMEDIATELY:   Following lower endoscopy (colonoscopy or flexible sigmoidoscopy):  Excessive amounts of blood in the stool  Significant tenderness or worsening of abdominal pains  Swelling of the abdomen that is new, acute  Fever of 100F or higher   Following upper endoscopy (EGD)  Vomiting of blood or coffee ground material  New chest pain or pain under the shoulder blades  Painful or persistently difficult swallowing  New shortness of breath  Fever of 100F or higher  Black, tarry-looking stools  For urgent or emergent issues, a gastroenterologist can be reached at any hour by calling [phone].   DIET: Your first meal following the procedure should be a small meal and then it is ok to progress to  your normal diet. Heavy or fried foods are harder to digest and may make you feel nauseous or bloated.  Likewise, meals heavy in dairy and vegetables can increase bloating.  Drink plenty of fluids but you should avoid alcoholic beverages for 24 hours. Increase the fiber in your diet due to the diverticulosis.  ACTIVITY:  You should plan to take it easy for the rest of today and you should NOT DRIVE or use heavy machinery until tomorrow (because of the sedation medicines used during the test).    FOLLOW UP: Our staff will call the number listed on your records the next business day following your procedure to check on you and address any questions or concerns that you may have regarding the information given to you following your procedure. If we do not reach you, we will leave a message.  However, if you are feeling well and you are not experiencing any problems, there is no need to return our call.  We will assume that you have returned to your regular daily activities without incident.  If any biopsies were taken you will be contacted by phone or by letter within the next 1-3 weeks.  Please call us at [phone] if you have not heard about the biopsies in 3 weeks.    SIGNATURES/CONFIDENTIALITY: You and/or your care partner have signed paperwork which will be entered into your electronic medical record.  These signatures attest to the fact that that the  information above on your After Visit Summary has been reviewed and is understood.  Full responsibility of the confidentiality of this discharge information lies with you and/or your care-partner.  Try to read all of the handouts given to you by your recovery room nurse.

## 2014-07-15 NOTE — Progress Notes (Signed)
Called to room to assist during endoscopic procedure.  Patient ID and intended procedure confirmed with present staff. Received instructions for my participation in the procedure from the performing physician.  

## 2014-07-15 NOTE — Progress Notes (Signed)
Pt awake and alert, pleased with MAC. Report to RN 

## 2014-07-15 NOTE — Op Note (Signed)
[facility]  [address] [place], [address]   COLONOSCOPY PROCEDURE REPORT  PATIENT: Lindsey Gomez, Lindsey Gomez  MR#: [id number] BIRTHDATE: [birth year] , 7  yrs. old GENDER: female ENDOSCOPIST: Inda Castle, MD REFERRED BY:Hal Felipa Eth, M.D. PROCEDURE DATE:  07/15/2014 PROCEDURE:   Colonoscopy with snare polypectomy, Submucosal injection, any substance, and Colonoscopy, screening First Screening Colonoscopy - Avg.  risk and is 50 yrs.  old or older - No.  Prior Negative Screening - Now for repeat screening. 10 or more years since last screening  History of Adenoma - Now for follow-up colonoscopy & has been > or = to 3 yrs.  N/A  Polyps Removed Today ASA CLASS:   Class II INDICATIONS:Colorectal Neoplasm Risk Assessment for this procedure is average risk. MEDICATIONS: Monitored anesthesia care and Propofol 250 mg IV  DESCRIPTION OF PROCEDURE:   After the risks benefits and alternatives of the procedure were thoroughly explained, informed consent was obtained.  The digital rectal exam revealed no abnormalities of the rectum.   The LB [id number]  endoscope was introduced through the anus and advanced to the cecum, which was identified by both the appendix and ileocecal valve. No adverse events experienced.   The quality of the prep was (Suprep was used) good.  The instrument was then slowly withdrawn as the colon was fully examined.      COLON FINDINGS: There was mild diverticulosis noted in the descending colon and sigmoid colon.   A sessile polyp measuring 20 mm in size was found in the ascending colon.  A polypectomy was performed in a piecemeal fashion using snare cautery.  The resection was complete, the polyp tissue was completely retrieved and sent to histology.  A saline Injection 2.5cc was given to lift the mucosal wall.  Following snare polypectomy polyp remnants were removed with hot biopsy forceps.  Retroflexed views revealed no abnormalities. The  time to cecum = 7.8 Withdrawal time = 15.4   The scope was withdrawn and the procedure completed. COMPLICATIONS: There were no immediate complications.  ENDOSCOPIC IMPRESSION: 1.   Mild diverticulosis was noted in the descending colon and sigmoid colon 2.   Sessile polyp was found in the ascending colon; polypectomy was performed in a piecemeal fashion using snare cautery and hot biopsy forceps.; saline was given to lift the mucosal wall.;  RECOMMENDATIONS: If the polyp(s) removed today are proven to be adenomatous (pre-cancerous) polyps, you will need a colonoscopy in 3 years. Otherwise you should continue to follow colorectal cancer screening guidelines for "routine risk" patients with a colonoscopy in 10 years.  You will receive a letter within 1-2 weeks with the results of your biopsy as well as final recommendations.  Please call my office if you have not received a letter after 3 weeks.  eSigned:  Inda Castle, MD 07/15/2014 12:09 PM   cc:   PATIENT NAME:  Lindsey Gomez, Lindsey Gomez MR#: [id number]

## 2014-07-16 ENCOUNTER — Telehealth: Payer: Self-pay | Admitting: *Deleted

## 2014-07-16 LAB — CANCER ANTIGEN 19-9: CA 19-9: 84.2 U/mL — ABNORMAL HIGH (ref <35.0)

## 2014-07-16 NOTE — Telephone Encounter (Signed)
  Follow up Call-  Call back number 07/15/2014  Post procedure Call Back phone  # [phone]  Permission to leave phone message Yes     Patient questions:  Do you have a fever, pain , or abdominal swelling? No. Pain Score  0 *  Have you tolerated food without any problems? Yes.    Have you been able to return to your normal activities? Yes.    Do you have any questions about your discharge instructions: Diet   No. Medications  No. Follow up visit  No.  Do you have questions or concerns about your Care? No.  Actions: * If pain score is 4 or above: No action needed, pain <4.

## 2014-07-20 ENCOUNTER — Encounter: Payer: Self-pay | Admitting: Internal Medicine

## 2014-07-20 ENCOUNTER — Ambulatory Visit (INDEPENDENT_AMBULATORY_CARE_PROVIDER_SITE_OTHER): Payer: Medicare Other | Admitting: Internal Medicine

## 2014-07-20 ENCOUNTER — Encounter: Payer: Self-pay | Admitting: Gastroenterology

## 2014-07-20 VITALS — BP 102/70 | HR 76 | Ht 62.0 in | Wt 94.8 lb

## 2014-07-20 DIAGNOSIS — R918 Other nonspecific abnormal finding of lung field: Secondary | ICD-10-CM | POA: Diagnosis not present

## 2014-07-20 MED ORDER — AMOXICILLIN-POT CLAVULANATE 875-125 MG PO TABS: 1.0000 | ORAL_TABLET | Freq: Two times a day (BID) | ORAL | Status: DC

## 2014-07-20 NOTE — Patient Instructions (Signed)
Augmentin 875 mg take one pill twice daily  X 10 days - take at breakfast and supper with large glass of water.  It would help reduce the usual side effects (diarrhea and yeast infections) if you ate cultured yogurt at lunch.   Please schedule a follow up office visit in 2 weeks, sooner if needed with a cxr on return

## 2014-07-20 NOTE — Progress Notes (Signed)
Subjective:    Patient ID: Lindsey Gomez, female    DOB: [birth year],   MRN: [id number]  HPI  38 yowf quit smokng  01/2014 referred 07/20/2014 to pulmonary clinic by Dr Felipa Eth s/p adm with pna with wosening nodular changes on CT chest   Admit date: 04/09/2014 Discharge date: 04/15/2014  Time spent: greater than 30 minutes  Discharge Diagnoses:  Principal Problem:  HCAP (healthcare-associated pneumonia) Active Problems:  Anxiety state  Essential hypertension  PANCREATITIS, CHRONIC  Personal history of malignant neoplasm of breast  GERD (gastroesophageal reflux disease)  Barrett's esophagus  Abnormality of gait  Acute renal failure  DM2 (diabetes mellitus, type 2)  Thrombocytopenia  Severe protein-calorie malnutrition  Memory impairment  Chronic migraine  Hypokalemia  Retention of urine   Discharge Condition: stable  Diet recommendation: D2, nectar thickened  Filed Weights   04/09/14 1006 04/09/14 1259  Weight: 39.009 kg (86 lb) 39.2 kg (86 lb 6.7 oz)    History of present illness:  69 year old female with uncontrolled type 2 diabetes mellitus, chronic pancreatitis status post partial pancreatectomy, chronic migraine headaches, GERD, fibromyalgia history of right-sided breast cancer, essential hypertension, COPD (quit smoking 2 months back) and mild memory impairment who was recently hospitalized for nonketotic hyperosmolar state and acute kidney injury the was brought to the ED with fever of 103.24F at home associated with productive cough with greenish sputum for the past 2-3 days and some dyspnea on exertion and generalized fatigue. Patient admitted with healthcare associated pneumonia.  Hospital Course:  HCAP (healthcare-associated pneumonia) vs aspiration pneumonia Started on vancomycin and cefepime. Blood cultures negativeUrine strep antigen negative. urine for Legionella antibody negative. - follow-up chest x-ray showing worsened right  upper lobe pneumonia. Cefepime switched to Zosyn for anaerobic coverage. -Symptoms concerning for aspiration pneumonia. Seen by swallow eval and recommend dysphagia level II diet with nectar thick liquid.  By discharge, improved, afebrile and tolerating oral antibiotics   Acute renal failure Likely secondary to dehydration. Resolved with IV fluids.  Urinary retention Noted for about 500 mL urine retained in bladder scan overnight. Renal ultrasound shows possible medical renal disease.  Foley catheter placed for 24 hours and removed. Has been able to void without difficulty.  Hypokalemia corrected   DM2 (diabetes mellitus, type 2) Remained controlled   Thrombocytopenia Likely secondary to acute illness. Slowly improving   Anxiety state Change ativan to qhs. Sleepy during day   Essential hypertension Blood pressure stable. Continue home medications  Metabolic acidosis Resolved with treatsment of pneumonia and IVF   GERD (gastroesophageal reflux disease) and Barrett's esophagus Continue PPI  Chronic migraine On multiple medications which have been resumed. Follows with outpatient neurology   Abnormality of gait Follows with neurology as outpatient. Previous imaging showing age disproportionate mild generalized cerebral atrophy. CT head repeated given urinary incontinence and unsteady gait and no new findings.   Severe protein-calorie malnutrition Nutrition consult   Memory impairment Mild. Continue home medications.  Diarrhea: c diff neg. Prn imodium  COPD and ongoing tobacco use Continue home inhalers. Add prn albuterol nebs. Continue nicotine patch.   Iron def anemia Continue iron sulfate  Dysphagia: on D2 nectar  Deconditioning: insurance has denied coverage for CIR. Patient refuses SNF. Home with aide, PT, OT, RN        07/20/2014 1st [facility] office visit/ Tinzlee Craker   Chief Complaint  Patient presents with  . Pulmonary Consult     Referred by Dr. Lajean Manes for eval of abnormal cxr. Pt c/o  cough "for a long time"- prod with green sputum.    cough x 6 months worse first thing am assoc with green mucus production  Denies being aware of any swallowing issues/ restrictions or difficulties   Note prev w/u by Arlyce Dice 06/29/2003> open lung bx:    necrotizing granulomatous changes LUL wedge/ never further specified as to cause   In terms of cough, No obvious other patterns in day to day or daytime variabilty or assoc chronic sob (though very sedentary)  cp or chest tightness, subjective wheeze overt sinus or hb symptoms. No unusual exp hx or h/o childhood pna/ asthma or knowledge of premature birth.  Sleeping ok without nocturnal  or early am exacerbation  of respiratory  c/o's or need for noct saba. Also denies any obvious fluctuation of symptoms with weather or environmental changes or other aggravating or alleviating factors except as outlined above   Current Medications, Allergies, Complete Past Medical History, Past Surgical History, Family History, and Social History were reviewed in [employer] record.                  Review of Systems  Constitutional: Positive for appetite change and unexpected weight change. Negative for fever and chills.  HENT: Positive for congestion and dental problem. Negative for ear pain, nosebleeds, postnasal drip, rhinorrhea, sinus pressure, sneezing, sore throat, trouble swallowing and voice change.   Eyes: Negative for visual disturbance.  Respiratory: Positive for cough. Negative for choking and shortness of breath.   Cardiovascular: Negative for chest pain and leg swelling.  Gastrointestinal: Positive for abdominal pain. Negative for vomiting and diarrhea.  Genitourinary: Negative for difficulty urinating.  Musculoskeletal: Negative for arthralgias.  Skin: Negative for rash.  Neurological: Positive for headaches. Negative for tremors and syncope.    Hematological: Does not bruise/bleed easily.       Objective:   Physical Exam  amb wf unusual affect  Wt Readings from Last 3 Encounters:  07/20/14 94 lb 12.8 oz (43.001 kg)  07/15/14 95 lb (43.092 kg)  07/01/14 95 lb 6.4 oz (43.273 kg)    Vital signs reviewed     HEENT: endentulous with nl  turbinates, and orophanx. Nl external ear canals without cough reflex   NECK :  without JVD/Nodes/TM/ nl carotid upstrokes bilaterally   LUNGS: no acc muscle use,  Distant bs  bilaterally with minimal insp/ exp rhonchi  L > R  CV:  RRR  no s3 or murmur or increase in P2, no edema   ABD:  soft and nontender with nl excursion in the supine position. No bruits or organomegaly, bowel sounds nl  MS:  warm without deformities, calf tenderness, cyanosis or clubbing  SKIN: warm and dry without lesions    NEURO:  alert, approp, no deficits     I personally reviewed images and agree with radiology impression as follows:   CT s contrast  07/12/14   COPD changes with scattered areas of parenchymal scarring.  Progressive airspace opacity within LEFT lower lobe significantly increased since previous exam; while this could represent infection, tumor is not excluded ; this would be best assessed by bronchoscopy with attention to the LEFT lower lobe.  Multiple nodular foci in both lungs, majority stable though a cluster of new nodules is identified in the RIGHT lower lobe, some which are calcified, favor these representing granulomatous process though not definitive.  New tree-in-bud opacities in RIGHT middle lobe raising question of bronchiolitis.  Assessment & Plan:

## 2014-07-21 ENCOUNTER — Ambulatory Visit: Payer: Medicare Other | Admitting: Neurology

## 2014-07-21 ENCOUNTER — Encounter: Payer: Self-pay | Admitting: Internal Medicine

## 2014-07-21 DIAGNOSIS — R918 Other nonspecific abnormal finding of lung field: Secondary | ICD-10-CM | POA: Insufficient documentation

## 2014-07-21 NOTE — Assessment & Plan Note (Signed)
-     Wedge bx 4/19/ 2005 LUL  Necotizing granulomas nos  -  CT s contrast  07/12/14 > new LL as dz/ longstanding mpns  I had an extended discussion with the patient reviewing all relevant studies completed to date and  lasting  35 minutes   on the following ongoing concerns:   1) her chronic green sputum, prev ST w/u and dep AS dz in LLL all are very suggestive of chronic asp pna  > try Augmentin 875 mg take one pill twice daily  X 10 days -  Then return here in 2 weeks to regroup > consider repeat ST eval / input (pt denies any knowledge of prev w/u or recs so whatever was rec probably didn't "take" the first time  2) the MPN's are likely benign though have not ruled out MAI > strongly doubt they are contributing to any of her ongoing symptoms and have already been thoroughly eval in 2005 with non-specific findings so for now follow conservatively  3) Discussed in detail all the  indications, usual  risks and alternatives  relative to the benefits with patient who agrees to proceed with plan as outlined

## 2014-07-22 ENCOUNTER — Other Ambulatory Visit: Payer: Self-pay | Admitting: Internal Medicine

## 2014-07-22 ENCOUNTER — Ambulatory Visit (AMBULATORY_SURGERY_CENTER): Payer: Self-pay | Admitting: *Deleted

## 2014-07-22 VITALS — Ht 62.0 in | Wt 94.2 lb

## 2014-07-22 DIAGNOSIS — K2270 Barrett's esophagus without dysplasia: Secondary | ICD-10-CM

## 2014-07-22 NOTE — Progress Notes (Signed)
No egg or soy allergy Had a colon in the [place] 07-15-2014 No issues with sedation No diet pills No home 02

## 2014-07-27 ENCOUNTER — Ambulatory Visit (AMBULATORY_SURGERY_CENTER): Payer: Medicare Other | Admitting: Gastroenterology

## 2014-07-27 ENCOUNTER — Encounter: Payer: Self-pay | Admitting: Gastroenterology

## 2014-07-27 ENCOUNTER — Other Ambulatory Visit: Payer: Self-pay | Admitting: Gastroenterology

## 2014-07-27 VITALS — BP 164/85 | HR 55 | Temp 97.0°F | Resp 18 | Ht 62.0 in | Wt 94.0 lb

## 2014-07-27 DIAGNOSIS — K2270 Barrett's esophagus without dysplasia: Secondary | ICD-10-CM

## 2014-07-27 LAB — GLUCOSE, CAPILLARY
GLUCOSE-CAPILLARY: 155 mg/dL — AB (ref 65–99)
Glucose-Capillary: 160 mg/dL — ABNORMAL HIGH (ref 65–99)

## 2014-07-27 MED ORDER — SODIUM CHLORIDE 0.9 % IV SOLN: 500.0000 mL | INTRAVENOUS | Status: DC

## 2014-07-27 NOTE — Op Note (Signed)
[facility]  [address]. [place], [address]   ENDOSCOPY PROCEDURE REPORT  PATIENT: Lindsey, Gomez  MR#: [id number] BIRTHDATE: 20-Aug-1945 , 85  yrs. old GENDER: female ENDOSCOPIST: Inda Castle, MD REFERRED BY:  Lajean Manes, M.D. PROCEDURE DATE:  07/27/2014 PROCEDURE:  EGD w/ biopsy ASA CLASS:     Class II INDICATIONS:  history of Barrett's esophagus. MEDICATIONS: Monitored anesthesia care, Propofol 100 mg IV, and Simethicone 20mg  PO TOPICAL ANESTHETIC:  DESCRIPTION OF PROCEDURE: After the risks benefits and alternatives of the procedure were thoroughly explained, informed consent was obtained.  The LB [id number] endoscope was introduced through the mouth and advanced to the second portion of the duodenum , Without limitations.  The instrument was slowly withdrawn as the mucosa was fully examined.    There is minimal irregularity at the GE junction.  Multiple biopsies were taken because of a history of Barrett's esophagus.   Except for the findings listed, the EGD was otherwise normal.  Retroflexed views revealed no abnormalities.     The scope was then withdrawn from the patient and the procedure completed.  COMPLICATIONS: There were no immediate complications.  ENDOSCOPIC IMPRESSION: 1.   There is minimal irregularity at the GE junction.  Multiple biopsies were taken because of a history of Barrett's esophagus 2.   EGD was otherwise normal  RECOMMENDATIONS: Await biopsy results  REPEAT EXAM:  eSigned:  Inda Castle, MD 07/27/2014 10:04 AM    CC:

## 2014-07-27 NOTE — Progress Notes (Signed)
Called to room to assist during endoscopic procedure.  Patient ID and intended procedure confirmed with present staff. Received instructions for my participation in the procedure from the performing physician.  

## 2014-07-27 NOTE — Progress Notes (Signed)
  [facility] Post-op Note  Patient: Lindsey Gomez  Procedure(s) Performed: endoscopy  Patient Location: [facility] - Recovery Area  Anesthesia Type: Deep Sedation/Propofol  Level of Consciousness: awake, oriented and patient cooperative  Airway and Oxygen Therapy: Patient Spontanous Breathing  Post-op Pain: none  Post-op Assessment:  Post-op Vital signs reviewed, Patient's Cardiovascular Status Stable, Respiratory Function Stable, Patent Airway, No signs of Nausea or vomiting and Pain level controlled  Post-op Vital Signs: Reviewed and stable  Complications: No apparent anesthesia complications  Luretta Everly E 10:06 AM

## 2014-07-27 NOTE — Patient Instructions (Signed)
YOU HAD AN ENDOSCOPIC PROCEDURE TODAY AT THE [facility]:   Refer to the procedure report that was given to you for any specific questions about what was found during the examination.  If the procedure report does not answer your questions, please call your gastroenterologist to clarify.  If you requested that your care partner not be given the details of your procedure findings, then the procedure report has been included in a sealed envelope for you to review at your convenience later.  YOU SHOULD EXPECT: Some feelings of bloating in the abdomen. Passage of more gas than usual.  Walking can help get rid of the air that was put into your GI tract during the procedure and reduce the bloating. If you had a lower endoscopy (such as a colonoscopy or flexible sigmoidoscopy) you may notice spotting of blood in your stool or on the toilet paper. If you underwent a bowel prep for your procedure, you may not have a normal bowel movement for a few days.  Please Note:  You might notice some irritation and congestion in your nose or some drainage.  This is from the oxygen used during your procedure.  There is no need for concern and it should clear up in a day or so.  SYMPTOMS TO REPORT IMMEDIATELY:    Following upper endoscopy (EGD)  Vomiting of blood or coffee ground material  New chest pain or pain under the shoulder blades  Painful or persistently difficult swallowing  New shortness of breath  Fever of 100F or higher  Black, tarry-looking stools  For urgent or emergent issues, a gastroenterologist can be reached at any hour by calling [phone].   DIET: Your first meal following the procedure should be a small meal and then it is ok to progress to your normal diet. Heavy or fried foods are harder to digest and may make you feel nauseous or bloated.  Likewise, meals heavy in dairy and vegetables can increase bloating.  Drink plenty of fluids but you should avoid alcoholic beverages  for 24 hours.  ACTIVITY:  You should plan to take it easy for the rest of today and you should NOT DRIVE or use heavy machinery until tomorrow (because of the sedation medicines used during the test).    FOLLOW UP: Our staff will call the number listed on your records the next business day following your procedure to check on you and address any questions or concerns that you may have regarding the information given to you following your procedure. If we do not reach you, we will leave a message.  However, if you are feeling well and you are not experiencing any problems, there is no need to return our call.  We will assume that you have returned to your regular daily activities without incident.  If any biopsies were taken you will be contacted by phone or by letter within the next 1-3 weeks.  Please call us at [phone] if you have not heard about the biopsies in 3 weeks.    SIGNATURES/CONFIDENTIALITY: You and/or your care partner have signed paperwork which will be entered into your electronic medical record.  These signatures attest to the fact that that the information above on your After Visit Summary has been reviewed and is understood.  Full responsibility of the confidentiality of this discharge information lies with you and/or your care-partner. 

## 2014-07-28 ENCOUNTER — Telehealth: Payer: Self-pay | Admitting: *Deleted

## 2014-07-28 NOTE — Telephone Encounter (Signed)
  Follow up Call-  Call back number 07/27/2014 07/15/2014  Post procedure Call Back phone  # [phone]  Permission to leave phone message Yes Yes    Spoke with pt's husband- she was asleep Patient questions:  Do you have a fever, pain , or abdominal swelling? No. Pain Score  0 *  Have you tolerated food without any problems? Yes.    Have you been able to return to your normal activities? Yes.    Do you have any questions about your discharge instructions: Diet   No. Medications  No. Follow up visit  No.  Do you have questions or concerns about your Care? No.  Actions: * If pain score is 4 or above: No action needed, pain <4.

## 2014-08-03 ENCOUNTER — Encounter: Payer: Self-pay | Admitting: Gastroenterology

## 2014-08-04 ENCOUNTER — Ambulatory Visit (INDEPENDENT_AMBULATORY_CARE_PROVIDER_SITE_OTHER)
Admission: RE | Admit: 2014-08-04 | Discharge: 2014-08-04 | Disposition: A | Discharge status: Home / Self Care | Payer: Medicare Other | Source: Ambulatory Visit | Attending: Internal Medicine | Admitting: Internal Medicine

## 2014-08-04 ENCOUNTER — Encounter: Payer: Self-pay | Admitting: Internal Medicine

## 2014-08-04 ENCOUNTER — Ambulatory Visit (INDEPENDENT_AMBULATORY_CARE_PROVIDER_SITE_OTHER): Payer: Medicare Other | Admitting: Internal Medicine

## 2014-08-04 VITALS — BP 108/72 | HR 60 | Ht 62.0 in | Wt 96.8 lb

## 2014-08-04 DIAGNOSIS — R918 Other nonspecific abnormal finding of lung field: Secondary | ICD-10-CM

## 2014-08-04 NOTE — Assessment & Plan Note (Signed)
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  and levaquin (500 daily x 10 dasy)  2nd line for acute flares and do FOB  if there is plain film evidence of obvious progression of the chronic component of her problem   Clearly maintaining off cigs and avoiding aspiration are the most important aspect of her care.   Pulmonary f/u can be prn

## 2014-08-04 NOTE — Progress Notes (Signed)
Subjective:    Patient ID: Lindsey Gomez, female    DOB: 06-13-1945,   MRN: [id number]    Brief patient profile:  30 yowf  With 82 problems listed in EPIC quit smokng  01/2014 referred 07/20/2014 to pulmonary clinic by Dr Felipa Eth s/p adm with pna with wosening nodular changes on CT chest   Admit date: 04/09/2014 Discharge date: 04/15/2014  Time spent: greater than 30 minutes  Discharge Diagnoses:  Principal Problem:  HCAP (healthcare-associated pneumonia) Active Problems:  Anxiety state  Essential hypertension  PANCREATITIS, CHRONIC  Personal history of malignant neoplasm of breast  GERD (gastroesophageal reflux disease)  Barrett's esophagus  Abnormality of gait  Acute renal failure  DM2 (diabetes mellitus, type 2)  Thrombocytopenia  Severe protein-calorie malnutrition  Memory impairment  Chronic migraine  Hypokalemia  Retention of urine   Discharge Condition: stable  Diet recommendation: D2, nectar thickened  Filed Weights   04/09/14 1006 04/09/14 1259  Weight: 39.009 kg (86 lb) 39.2 kg (86 lb 6.7 oz)    History of present illness:  69 year old female with uncontrolled type 2 diabetes mellitus, chronic pancreatitis status post partial pancreatectomy, chronic migraine headaches, GERD, fibromyalgia history of right-sided breast cancer, essential hypertension, COPD (quit smoking 2 months back) and mild memory impairment who was recently hospitalized for nonketotic hyperosmolar state and acute kidney injury the was brought to the ED with fever of 103.49F at home associated with productive cough with greenish sputum for the past 2-3 days and some dyspnea on exertion and generalized fatigue. Patient admitted with healthcare associated pneumonia.  Hospital Course:  HCAP (healthcare-associated pneumonia) vs aspiration pneumonia Started on vancomycin and cefepime. Blood cultures negative/ Urine strep antigen negative. urine for Legionella antibody  negative. - follow-up chest x-ray showing worsened right upper lobe pneumonia. Cefepime switched to Zosyn for anaerobic coverage. -Symptoms concerning for aspiration pneumonia. Seen by swallow eval and recommend dysphagia level II diet with nectar thick liquid.  By discharge, improved, afebrile and tolerating oral antibiotics   Acute renal failure Likely secondary to dehydration. Resolved with IV fluids.  Urinary retention Noted for about 500 mL urine retained in bladder scan overnight. Renal ultrasound shows possible medical renal disease.  Foley catheter placed for 24 hours and removed. Has been able to void without difficulty.  Hypokalemia corrected   DM2 (diabetes mellitus, type 2) Remained controlled   Thrombocytopenia Likely secondary to acute illness. Slowly improving   Anxiety state Change ativan to qhs. Sleepy during day   Essential hypertension Blood pressure stable. Continue home medications  Metabolic acidosis Resolved with treatsment of pneumonia and IVF   GERD (gastroesophageal reflux disease) and Barrett's esophagus Continue PPI  Chronic migraine On multiple medications which have been resumed. Follows with outpatient neurology   Abnormality of gait Follows with neurology as outpatient. Previous imaging showing age disproportionate mild generalized cerebral atrophy. CT head repeated given urinary incontinence and unsteady gait and no new findings.   Severe protein-calorie malnutrition Nutrition consult   Memory impairment Mild. Continue home medications.  Diarrhea: c diff neg. Prn imodium  COPD and ongoing tobacco use Continue home inhalers. Add prn albuterol nebs. Continue nicotine patch.   Iron def anemia Continue iron sulfate  Dysphagia: on D2 nectar  Deconditioning: insurance has denied coverage for CIR. Patient refuses SNF. Home with aide, PT, OT, RN       History of Present Illness  07/20/2014 1st [facility] office  visit/ Clarece Drzewiecki   Chief Complaint  Patient presents with  . Pulmonary  Consult    Referred by Dr. Lajean Manes for eval of abnormal cxr. Pt c/o cough "for a long time"- prod with green sputum.    cough x 6 months worse first thing am assoc with green mucus production  Denies being aware of any swallowing issues/ restrictions or difficulties  Note prev w/u by Arlyce Dice 06/29/2003> open lung bx:    necrotizing granulomatous changes LUL wedge/ never further specified as to cause  rec Augmentin 875 mg take one pill twice daily  X 10 days     08/04/2014 f/u ov/Teegan Brandis re: pulmonary infiltrates ? Recurrent pna  Chief Complaint  Patient presents with  . Follow-up    Pt here for 2 week f/u green mucus with cough still present but less. Patient finished Augmentin     Overall much better, swallowing ok now   Not limited by breathing from desired activities  But very inactive   No obvious day to day or daytime variabilty or assoc chronic cough or cp or chest tightness, subjective wheeze overt sinus or hb symptoms. No unusual exp hx or h/o childhood pna/ asthma or knowledge of premature birth.  Sleeping ok without nocturnal  or early am exacerbation  of respiratory  c/o's or need for noct saba. Also denies any obvious fluctuation of symptoms with weather or environmental changes or other aggravating or alleviating factors except as outlined above   Current Medications, Allergies, Complete Past Medical History, Past Surgical History, Family History, and Social History were reviewed in [employer] record.  ROS  The following are not active complaints unless bolded sore throat, dysphagia, dental problems, itching, sneezing,  nasal congestion or excess/ purulent secretions, ear ache,   fever, chills, sweats, unintended wt loss, pleuritic or exertional cp, hemoptysis,  orthopnea pnd or leg swelling, presyncope, palpitations, heartburn, abdominal pain, anorexia, nausea, vomiting, diarrhea  or  change in bowel or urinary habits, change in stools or urine, dysuria,hematuria,  rash, arthralgias, visual complaints, headache, numbness weakness or ataxia or problems with walking or coordination,  change in mood/affect or memory.              Objective:   Physical Exam  Chronically ill appearing amb wf unusual affect   08/04/2014       96  Wt Readings from Last 3 Encounters:  07/20/14 94 lb 12.8 oz (43.001 kg)  07/15/14 95 lb (43.092 kg)  07/01/14 95 lb 6.4 oz (43.273 kg)    Vital signs reviewed     HEENT: endentulous with nl  turbinates, and orophanx. Nl external ear canals without cough reflex   NECK :  without JVD/Nodes/TM/ nl carotid upstrokes bilaterally   LUNGS: no acc muscle use,  Distant bs  Bilaterally no rhonchi now   CV:  RRR  no s3 or murmur or increase in P2, no edema   ABD:  soft and nontender with nl excursion in the supine position. No bruits or organomegaly, bowel sounds nl  MS:  warm without deformities, calf tenderness, cyanosis or clubbing  SKIN: warm and dry without lesions    NEURO:  alert, approp, no deficits     I personally reviewed images and agree with radiology impression as follows:   CT s contrast  07/12/14   COPD changes with scattered areas of parenchymal scarring.  Progressive airspace opacity within LEFT lower lobe significantly increased since previous exam; while this could represent infection, tumor is not excluded ; this would be best assessed by bronchoscopy with attention to  the LEFT lower lobe.  Multiple nodular foci in both lungs, majority stable though a cluster of new nodules is identified in the RIGHT lower lobe, some which are calcified, favor these representing granulomatous process though not definitive.  New tree-in-bud opacities in RIGHT middle lobe raising question of Bronchiolitis.   CXR PA and Lateral:   08/04/2014 :     I personally reviewed images and agree with radiology impression as follows:     1. Improved airspace opacities in the left lower lobe since the prior CT 07/12/2014, though moderate airspace opacities persist. This likely represents improving pneumonia. Continued imaging follow up to complete clearance is suggested. 2. Stable conglomerate scarring in the medial aspect of the right middle lobe and in the inferior right upper lobe. 3. No new abnormalities involving either lung. 4. Stable severe COPD/emphysema.          Assessment & Plan:

## 2014-08-04 NOTE — Patient Instructions (Signed)
You have scarring on bronchial leads to recurrent infections and the tendency is to over treat this and cause resistant organisms in your lung and intestines  So rec hold further antibiotics - augmentin x 10 days is a reasonable choice   If more coughing consider repeating the swallowing study   Be sure you get Prevnar 13 at Dr Carlyle Lipa office   Pulmonary follow up is as needed

## 2014-08-05 ENCOUNTER — Encounter: Payer: Self-pay | Admitting: Physical Medicine & Rehabilitation

## 2014-08-05 ENCOUNTER — Ambulatory Visit (HOSPITAL_BASED_OUTPATIENT_CLINIC_OR_DEPARTMENT_OTHER): Payer: Medicare Other | Admitting: Physical Medicine & Rehabilitation

## 2014-08-05 ENCOUNTER — Telehealth: Payer: Self-pay | Admitting: Neurology

## 2014-08-05 ENCOUNTER — Encounter: Payer: Medicare Other | Attending: Physical Medicine & Rehabilitation

## 2014-08-05 VITALS — BP 133/61 | HR 64 | Resp 14

## 2014-08-05 DIAGNOSIS — G43719 Chronic migraine without aura, intractable, without status migrainosus: Secondary | ICD-10-CM | POA: Insufficient documentation

## 2014-08-05 NOTE — Progress Notes (Signed)
69 year old female who has been referred by [facility] neurology for Botox injections. She has a history of dementia as well as her chronic migraines. She has received 1 botulinum toxin injection approximately 3 months ago which was helpful and is here for a repeat injection. Her chronic migraines have not been adequately prophylaxed with oral medications such as Topamax She has no drug allergies to Botox   Dilution: 50 Units/ml Indication: Severe spasticity which interferes with ADL,mobility and/or  hygiene and is unresponsive to medication management and other conservative care Informed consent was obtained after describing risks and benefits of the procedure with the patient. This includes bleeding, bruising, infection, excessive weakness, or medication side effects. A REMS form is on file and signed. Needle: 27-gauge 1/2 inch needle as well as 30-gauge 1/2 inch needle. She tolerated a 30-gauge better Number of units per muscle Procerus 5 units Corrugator 5 units bilaterally Frontalis 10 units bilaterally Temporalis 20 units bilaterally Cervical paraspinal 10 units bilaterally Occipitalis 15 units bilaterally Trapezius 15 units bilaterally 155Units total Remainder was wasted All injections were done after obtaining appropriate EMG activity and after negative drawback for blood. The patient tolerated the procedure well. Post procedure instructions were given. A followup appointment was made.

## 2014-08-05 NOTE — Patient Instructions (Signed)

## 2014-08-05 NOTE — Telephone Encounter (Signed)
The Pt's primary care giver [name] called and requested to speak with Dr. Rhea Belton Nurse regarding the referral for botox injections that Dr. Krista Blue had given for the pt. [name] stated that they would like a new referral to a different Dr. Deborha Payment are having some issues with the doctor that is currently giving the botox injections and would like to see if it would be possible to change doctors.Please call and advise.

## 2014-08-05 NOTE — Telephone Encounter (Signed)
Spoke to [place] (caregiver) - pt had Botox treatment today with Dr. Joylene Igo.  I will discuss her concerns with Dr. Krista Blue.

## 2014-08-11 ENCOUNTER — Other Ambulatory Visit: Payer: Self-pay | Admitting: Internal Medicine

## 2014-08-11 NOTE — Telephone Encounter (Signed)
They would like see a doctor other than Dr. Joylene Igo.

## 2014-08-11 NOTE — Telephone Encounter (Signed)
I called the caregiver. I believe the issue with injections with that a larger bore needle was used, the patient did better when a smaller needle size was utilized. They have requested that Dr. Tessa Lerner do the next Botox injection there. I will try to get this set up.

## 2014-08-12 NOTE — Telephone Encounter (Signed)
I spoke to Lindsey Gomez at [facility]. She changed the patient's next Botox appointment to be with Dr. Naaman Plummer on August 24 at 10:40. I called to let the patient know but the husband requested I call back later when their caregiver is there and let her know about the time change. I will call later today.

## 2014-08-12 NOTE — Telephone Encounter (Signed)
Patients caregiver called and requested to speak with [facility] RN. I informed her that Charisse Klinefelter previously stated she would call back later, she requested that we return call on her cell phone [phone])

## 2014-08-12 NOTE — Telephone Encounter (Signed)
I called the patient's caregiver, [name]. I informed her of the appointment change so that the patient could see Dr. Naaman Plummer. It is on August 24 at 10:40. She stated that time was okay and thanked me for calling.

## 2014-08-16 ENCOUNTER — Telehealth: Payer: Self-pay | Admitting: Neurology

## 2014-08-16 ENCOUNTER — Other Ambulatory Visit: Payer: Self-pay | Admitting: Gastroenterology

## 2014-08-16 MED ORDER — BRINTELLIX 20 MG PO TABS: 20.0000 mg | ORAL_TABLET | Freq: Every day | ORAL | Status: DC

## 2014-08-16 NOTE — Telephone Encounter (Signed)
I called back to clarify.  Caregiver indicates the patient's PCP used to prescribe Brintellix, but they changed doctors and her new physician is not in the office.  Questioning if Dr Jannifer Franklin would refill this med.  Please advise.  Thank you.

## 2014-08-16 NOTE — Telephone Encounter (Signed)
Patients caregiver ([name]-caregiver)called stating patient is out of BRINTELLIX 20 MG TABS. The pharmacy sent the refill request to her PCP  instead of Dr. Jannifer Franklin. She can be reached at [phone].

## 2014-08-16 NOTE — Telephone Encounter (Signed)
I have sent in a refill

## 2014-08-19 ENCOUNTER — Ambulatory Visit (INDEPENDENT_AMBULATORY_CARE_PROVIDER_SITE_OTHER): Payer: Medicare Other | Admitting: Neurology

## 2014-08-19 ENCOUNTER — Encounter: Payer: Self-pay | Admitting: Neurology

## 2014-08-19 VITALS — BP 125/81 | HR 72 | Ht 62.0 in | Wt 96.8 lb

## 2014-08-19 DIAGNOSIS — R413 Other amnesia: Secondary | ICD-10-CM | POA: Diagnosis not present

## 2014-08-19 DIAGNOSIS — R569 Unspecified convulsions: Secondary | ICD-10-CM | POA: Diagnosis not present

## 2014-08-19 DIAGNOSIS — R269 Unspecified abnormalities of gait and mobility: Secondary | ICD-10-CM | POA: Diagnosis not present

## 2014-08-19 NOTE — Progress Notes (Signed)
Reason for visit: Headache  Lindsey Gomez is an 69 y.o. female  History of present illness:  Lindsey Gomez is a 69 year old right-handed [place] female with a history of intractable migraine headache. The patient is getting Botox for this with some benefit. She has also developed a gait disorder with features of parkinsonism. She has not had a falls since last seen. The patient has not had any tremors, but she will shuffle when walking. The patient also has continued to have issues with her appetite. 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 She has been able to gain about 4 pounds since last seen, however. The patient has a history of a seizure in the past possibly related to benzodiazepine withdrawal. She has some memory issues as well. The patient returns to this office for an evaluation.  Past Medical History  Diagnosis Date  . Depression   . HTN (hypertension)   . Chronic pain   . Fibromyalgia   . Migraines   . History of breast cancer     Right  . Chronic pancreatitis   . Vitamin D deficiency   . GERD (gastroesophageal reflux disease)   . Finger dislocation 2009    L 3rd  . COPD (chronic obstructive pulmonary disease)   . Anemia   . Fractured sternum 11/2008  . Osteoporosis     Reclast too expensive  . Barrett's esophagus   . Chronic fatigue   . Diverticulosis   . Opioid dependence   . 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 Abnormality of gait 10/12/2013  . Anxiety   . Depression   . Type II or unspecified type diabetes mellitus without mention of complication, not stated as uncontrolled   . Pancreatitis   . Hypoglycemia   . Lung nodules   . Chronic kidney disease   . Parkinson disease   . Parkinson's disease 06/15/2014  . Convulsions/seizures 10/12/2013    Possible benzodiazepine withdrawal seizure, x1-no further seizure activity   . Cancer     HX OF BREAST CANCER -chemo, radiation-right     Past Surgical History  Procedure Laterality Date  .  Nasal sinus surgery    . Pancreatectomy      40%  . Cholecystectomy    . Appendectomy    . Abdominal hysterectomy    . Lobectomy Left 2005    granulomatous lesion.   . Breast lumpectomy Left   . Ercp N/A 10/05/2013    Procedure: ENDOSCOPIC RETROGRADE CHOLANGIOPANCREATOGRAPHY (ERCP);  Surgeon: Ladene Artist, MD;  Location: [facility] ENDOSCOPY;  Service: Endoscopy;  Laterality: N/A;  . Cataract extraction Bilateral   . Botox injection      recent botox injection 12-23-13 injection for Migraines  . Cataract extraction, bilateral Bilateral     bilateral  . Eye surgery      1 eye "membrane surgery"  . Ercp N/A 01/07/2014    Procedure: ENDOSCOPIC RETROGRADE CHOLANGIOPANCREATOGRAPHY (ERCP);  Surgeon: Inda Castle, MD;  Location: [facility] ENDOSCOPY;  Service: Endoscopy;  Laterality: N/A;  . Spyglass cholangioscopy N/A 01/07/2014    Procedure: LKGMWNUU CHOLANGIOSCOPY;  Surgeon: Inda Castle, MD;  Location: [facility] ENDOSCOPY;  Service: Endoscopy;  Laterality: N/A;  . Colonoscopy    . Upper gastrointestinal endoscopy      Family History  Problem Relation Age of Onset  . COPD Father   . Heart disease Father   . CAD Father   . Cancer Father     "blood"  .  Leukemia Mother   . Diabetes Maternal Uncle   . Dementia Sister   . Alzheimer's disease Sister   . Heart attack Brother   . Colon cancer Neg Hx   . Colon polyps Neg Hx   . Esophageal cancer Neg Hx     Social history:  reports that she quit smoking about 6 months ago. Her smoking use included Cigarettes. She has a 40 pack-year smoking history. She has never used smokeless tobacco. She reports that she does not drink alcohol or use illicit drugs.    Allergies  Allergen Reactions  . Milnacipran Swelling and Other (See Comments)    Headache and hand swelling   . Rizatriptan Benzoate Nausea And Vomiting  . Gabapentin Other (See Comments)    Weak muscles  . Moxifloxacin Other (See Comments)    Unknown  . Sulfonamide Derivatives Rash  .  Venlafaxine Other (See Comments)    Unknown    Medications:  Prior to Admission medications   Medication Sig Start Date End Date Taking? Authorizing Provider  ACCU-CHEK AVIVA PLUS test strip  07/21/14  Yes Historical Provider, MD  acetaminophen (TYLENOL) 500 MG tablet Take 500 mg by mouth every 6 (six) hours as needed.   Yes Historical Provider, MD  atenolol (TENORMIN) 100 MG tablet Take 1 tablet (100 mg total) by mouth every morning. 05/15/13  Yes Aleksei Plotnikov V, MD  atorvastatin (LIPITOR) 10 MG tablet Take 10 mg by mouth daily.  06/03/14  Yes Historical Provider, MD  BD PEN NEEDLE NANO U/F 32G X 4 MM MISC  07/22/14  Yes Historical Provider, MD  Botulinum Toxin Type A 200 UNITS SOLR Inject 200 Units as directed every 3 (three) months.   Yes Historical Provider, MD  BRINTELLIX 20 MG TABS Take 20 mg by mouth daily. 08/16/14  Yes Kathrynn Ducking, MD  dicyclomine (BENTYL) 10 MG capsule Take 1 capsule (10 mg total) by mouth 3 (three) times daily before meals. 06/24/14  Yes Inda Castle, MD  docusate sodium (COLACE) 100 MG capsule Take 100 mg by mouth daily as needed for mild constipation.   Yes Historical Provider, MD  escitalopram (LEXAPRO) 20 MG tablet Take 1 tablet (20 mg total) by mouth daily. 07/01/14 07/01/15 Yes Kathlee Nations, MD  ferrous sulfate 325 (65 FE) MG tablet Take 325 mg by mouth daily with breakfast.   Yes Historical Provider, MD  hydrocortisone (CORTEF) 10 MG tablet Take 10 mg by mouth 2 (two) times daily.  03/17/14  Yes Historical Provider, MD  insulin aspart (NOVOLOG) 100 UNIT/ML FlexPen Inject 0-8 Units into the skin 3 (three) times daily with meals. 70-120 = 0 units, 121-150 = 1 unit, 151-200 = 2 units, 201-250 = 3 units, 251-300 = 5 units, 301-350 = 7 units. 01/13/13  Yes Renato Shin, MD  isometheptene-acetaminophen-dichloralphenazone (MIDRIN) 65-325-100 MG capsule Take 1 capsule by mouth 4 (four) times daily as needed for migraine. Maximum 5 capsules in 12 hours for migraine  headaches, 8 capsules in 24 hours for tension headaches.   Yes Historical Provider, MD  LEVEMIR FLEXTOUCH 100 UNIT/ML Pen Inject 11 Units into the skin at bedtime.  03/31/14  Yes Historical Provider, MD  loperamide (IMODIUM) 2 MG capsule Take 2-4 mg by mouth as needed for diarrhea or loose stools.  10/07/13  Yes Historical Provider, MD  LORazepam (ATIVAN) 1 MG tablet Take 1 tablet (1 mg total) by mouth 2 (two) times daily. 03/15/14  Yes Theodis Blaze, MD  Melatonin 10 MG  TABS Take 1 tablet by mouth at bedtime.   Yes Historical Provider, MD  Misc Natural Products ([place] PO) Take by mouth daily.   Yes Historical Provider, MD  mometasone (NASONEX) 50 MCG/ACT nasal spray Place 2 sprays into both nostrils daily as needed (for congestion).    Yes Historical Provider, MD  NAMENDA XR 28 MG CP24 24 hr capsule  07/05/14  Yes Historical Provider, MD  omeprazole (PRILOSEC) 40 MG capsule Take 1 capsule (40 mg total) by mouth 2 (two) times daily. Patient taking differently: Take 40 mg by mouth daily.  07/17/13  Yes Aleksei Plotnikov V, MD  ondansetron (ZOFRAN) 8 MG tablet TAKE 1 TABLET (8 MG TOTAL) BY MOUTH 2 (TWO) TIMES DAILY. 08/17/14  Yes Inda Castle, MD  pregabalin (LYRICA) 50 MG capsule Take 1 capsule (50 mg total) by mouth 2 (two) times daily. 06/15/14  Yes Kathrynn Ducking, MD  Probiotic Product (PROBIOTIC DAILY PO) Take by mouth daily.   Yes Historical Provider, MD  topiramate (TOPAMAX) 100 MG tablet TAKE 1 TABLET BY MOUTH TWICE DAILY 07/22/14  Yes Aleksei Plotnikov V, MD  triamcinolone (KENALOG) 0.1 % ointment Apply 1 application topically 2 (two) times daily as needed (itching).    Yes Historical Provider, MD  zolmitriptan (ZOMIG) 5 MG tablet Take 1 tablet (5 mg total) by mouth 2 (two) times daily as needed for migraine. Patient taking differently: Take 5 mg by mouth daily as needed for migraine.  03/18/14  Yes Kathrynn Ducking, MD    ROS:  Out of a complete 14 system review of symptoms,  the patient complains only of the following symptoms, and all other reviewed systems are negative.  Decreased activity, decreased appetite, chills, fatigue Cough, shortness of breath Cold intolerance, heat intolerance Abdominal pain, black stools, diarrhea, nausea Walking difficulty Memory loss, headache, weakness Agitation, confusion, depression, anxiety  Blood pressure 125/81, pulse 72, height 5\' 2"  (1.575 m), weight 96 lb 12.8 oz (43.908 kg).  Physical Exam  General: The patient is alert and cooperative at the time of the examination.  Skin: No significant peripheral edema is noted.   Neurologic Exam  Mental status: The patient is alert and oriented x 2 at the time of the examination (not oriented to date). The patient has apparent normal recent and remote memory, with an apparently normal attention span and concentration ability.   Cranial nerves: Facial symmetry is present. Speech is normal, no aphasia or dysarthria is noted. Extraocular movements are full. Visual fields are full. Masking of the face is seen.  Motor: The patient has good strength in all 4 extremities.  Sensory examination: Soft touch sensation is symmetric on the face, arms, and legs.  Coordination: The patient has good finger-nose-finger and heel-to-shin bilaterally. No tremors are noted.  Gait and station: The patient is able to arise from a seated position with arms crossed. Once up, the patient appears to have relatively good stride, she does have some arm swing, relatively good turns.  Reflexes: Deep tendon reflexes are symmetric.   Assessment/Plan:  1. Gait disorder  2. Parkinsonism  3. Memory disorder  4. Chronic headache  5. History of seizure  The patient appears to walk fairly well today, but the caretaker indicates that this is not her usual. We discussed potentially adding Sinemet today, but the patient does not wish to do this. We will continue to follow the walking issue, and the  memory. The patient will follow-up in 4 or 5 months.  I have encouraged the patient to remain active, and walk on a daily basis.  Jill Alexanders MD 08/19/2014 7:20 PM  [facility] [address] [place], [place] [address]  Phone [phone] Fax [phone]

## 2014-08-19 NOTE — Patient Instructions (Signed)

## 2014-08-23 ENCOUNTER — Other Ambulatory Visit (HOSPITAL_COMMUNITY): Payer: Self-pay | Admitting: Psychiatry

## 2014-08-24 ENCOUNTER — Other Ambulatory Visit (HOSPITAL_COMMUNITY): Payer: Self-pay | Admitting: Psychiatry

## 2014-08-25 NOTE — Telephone Encounter (Signed)
Telephone call with patient and her caregiver, per patient's words to verify she has enough Lexapro until she returns to see Dr. Adele Schilder on 09/01/14 so refill of Lexapro declined at this time.

## 2014-09-01 ENCOUNTER — Encounter (HOSPITAL_COMMUNITY): Payer: Self-pay | Admitting: Psychiatry

## 2014-09-01 ENCOUNTER — Ambulatory Visit (INDEPENDENT_AMBULATORY_CARE_PROVIDER_SITE_OTHER): Payer: No Typology Code available for payment source | Admitting: Psychiatry

## 2014-09-01 VITALS — BP 131/82 | HR 78 | Ht 62.0 in | Wt 97.0 lb

## 2014-09-01 DIAGNOSIS — F419 Anxiety disorder, unspecified: Secondary | ICD-10-CM

## 2014-09-01 DIAGNOSIS — F411 Generalized anxiety disorder: Secondary | ICD-10-CM

## 2014-09-01 MED ORDER — ESCITALOPRAM OXALATE 20 MG PO TABS: 20.0000 mg | ORAL_TABLET | Freq: Every day | ORAL | Status: DC

## 2014-09-01 NOTE — Progress Notes (Signed)
Lindsey Gomez [phone] Progress Note   Lindsey Gomez [id number] 69 y.o.  09/01/2014 4:02 PM  Chief Complaint:  Medication management and follow-up.        History of Present Illness:  Lindsey Gomez came for her follow-up appointment with her aide.  She is taking Lexapro 20 mg daily.  She is seen much improvement in her depression and anxiety symptoms however sometime she has poor sleep.  She was recently seen by Dr. Jannifer Franklin however she was not given any medication for Parkinson.  Her aide told that neurologist wanted to add low-dose medication but patient refused to take because she does not want to take too many medication.  Patient denies any irritability, anger, mood swing.  She denies any crying spells or any feeling of hopelessness.  Her chronic complains her pain .  Her appetite is improved from the past and she has gained weight in past 2 months.  She is more active social and denies any feeling of hopelessness.  She is taking multiple medication which includes lorazepam, Namenda, Lyrica and Topamax.  Patient denies any hallucination, paranoia, self abusive behavior.  Patient is not interested in counseling .  She believe her current medicine is working very well.    Suicidal Ideation: No Plan Formed: No Patient has means to carry out plan: No  Homicidal Ideation: No Plan Formed: No Patient has means to carry out plan: No  Past Psychiatric History/Hospitalization(s) Patient and her caregiver denies any previous history of psychiatric inpatient treatment or any suicidal attempt.  As per chart patient has given Celexa, amitriptyline, Brintellix and Zoloft.    Recently we tried Remeron but patient did not see any improvement. Anxiety: Yes Bipolar Disorder: No Depression: Yes Mania: No Psychosis: No Schizophrenia: No Personality Disorder: No Hospitalization for psychiatric illness: No History of Electroconvulsive Shock Therapy: No Prior Suicide Attempts: No  Past Medical History   Diagnosis Date  . Depression   . HTN (hypertension)   . Chronic pain   . Fibromyalgia   . Migraines   . History of breast cancer     Right  . Chronic pancreatitis   . Vitamin D deficiency   . GERD (gastroesophageal reflux disease)   . Finger dislocation 2009    L 3rd  . COPD (chronic obstructive pulmonary disease)   . Anemia   . Fractured sternum 11/2008  . Osteoporosis     Reclast too expensive  . Barrett's esophagus   . Chronic fatigue   . Diverticulosis   . Opioid dependence   . 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 Abnormality of gait 10/12/2013  . Anxiety   . Depression   . Type II or unspecified type diabetes mellitus without mention of complication, not stated as uncontrolled   . Pancreatitis   . Hypoglycemia   . Lung nodules   . Chronic kidney disease   . Parkinson disease   . Parkinson's disease 06/15/2014  . Convulsions/seizures 10/12/2013    Possible benzodiazepine withdrawal seizure, x1-no further seizure activity   . Cancer     HX OF BREAST CANCER -chemo, radiation-right     Review of Systems  Constitutional: Negative for weight loss.  Skin: Negative for itching and rash.  Neurological: Positive for tremors.       Shuffling gait    Psychiatric: Agitation: No Hallucination: No Depressed Mood: No Insomnia: Yes Hypersomnia: No Altered Concentration: No Feels Worthless: No Grandiose Ideas: No Belief In Special Powers: No New/Increased Substance Abuse: No Compulsions: No  Neurologic:  Headache: Yes Seizure: No Paresthesias: No   Musculoskeletal: Strength & Muscle Tone: spastic, decreased and atrophy Gait & Station: unsteady, shuffle Patient leans: Front and Backward  Constitutional:  BP 131/82 mmHg  Pulse 78  Ht 5\' 2"  (1.575 m)  Wt 97 lb (43.999 kg)  BMI 17.74 kg/m2   Mental Status Examination;  Patient is casually dressed and fairly groomed.  She  is pleasant and cooperative and maintained good eye contact.  Her speech is slow and she has  thought blocking.  She usually answer yes or no.  She described her mood  euthymic and her affect is appropriate.  Her psychomotor activity is decreased.  She denies any active or passive suicidal parts or homicidal thought.  There were no delusions or any paranoia.  Her fund of knowledge is below average.  She has difficulty remembering things.  Her attention concentration is fair.  She denies any auditory or visual hallucination.  Her cognition is okay but she does have difficulty remembering recent things.  Her gait is unsteady and shuffling.  She's alert and oriented 3.  Her insight judgment and impulse control is fair.   Established Problem, Stable/Improving (1), Review of Last Therapy Session (1) and Review of Medication Regimen & Side Effects (2)  Assessment: Axis I:  Anxiety disorder NOS, rule out depressive disorder single episode.  Axis II:  deferred  Axis III:  Past Medical History  Diagnosis Date  . Depression   . HTN (hypertension)   . Chronic pain   . Fibromyalgia   . Migraines   . History of breast cancer     Right  . Chronic pancreatitis   . Vitamin D deficiency   . GERD (gastroesophageal reflux disease)   . Finger dislocation 2009    L 3rd  . COPD (chronic obstructive pulmonary disease)   . Anemia   . Fractured sternum 11/2008  . Osteoporosis     Reclast too expensive  . Barrett's esophagus   . Chronic fatigue   . Diverticulosis   . Opioid dependence   . 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 Abnormality of gait 10/12/2013  . Anxiety   . Depression   . Type II or unspecified type diabetes mellitus without mention of complication, not stated as uncontrolled   . Pancreatitis   . Hypoglycemia   . Lung nodules   . Chronic kidney disease   . Parkinson disease   . Parkinson's disease 06/15/2014  . Convulsions/seizures 10/12/2013    Possible benzodiazepine withdrawal seizure, x1-no further seizure activity   . Cancer     HX OF BREAST CANCER -chemo, radiation-right       Plan:  Patient Doing better on Lexapro .  Dose was increased 2 months ago.  She is tolerating the medication without any side effects.  Discussed sleep hygiene and recommended to take Lexapro 20 mg 1 hour earlier than her scheduled bedtime for better response.  Discussed medication side effects and benefits.  Patient is taking brintellix, ativan, topomax, Lyrica from other provider.  Discussed polypharmacy and drug drug interaction.  Recommended to call us back if she has any question or any concern.  Follow-up in 3 months.  Patient is not interested in counseling.    Cagney Degrace T., MD 09/01/2014

## 2014-09-09 ENCOUNTER — Encounter: Payer: Self-pay | Admitting: Gastroenterology

## 2014-09-23 ENCOUNTER — Other Ambulatory Visit: Payer: Self-pay | Admitting: Internal Medicine

## 2014-09-23 ENCOUNTER — Other Ambulatory Visit: Payer: Self-pay | Admitting: Gastroenterology

## 2014-10-02 ENCOUNTER — Other Ambulatory Visit: Payer: Self-pay | Admitting: Neurology

## 2014-10-02 NOTE — Telephone Encounter (Signed)
Originally prescribed on 01/07

## 2014-10-19 ENCOUNTER — Ambulatory Visit (INDEPENDENT_AMBULATORY_CARE_PROVIDER_SITE_OTHER): Payer: Medicare Other | Admitting: Gastroenterology

## 2014-10-19 ENCOUNTER — Encounter: Payer: Self-pay | Admitting: Gastroenterology

## 2014-10-19 DIAGNOSIS — R1031 Right lower quadrant pain: Secondary | ICD-10-CM | POA: Diagnosis not present

## 2014-10-19 DIAGNOSIS — G8929 Other chronic pain: Secondary | ICD-10-CM

## 2014-10-19 DIAGNOSIS — R197 Diarrhea, unspecified: Secondary | ICD-10-CM

## 2014-10-19 MED ORDER — PANCRELIPASE (LIP-PROT-AMYL) 12000-38000 UNITS PO CPEP: 12000.0000 [IU] | ORAL_CAPSULE | Freq: Three times a day (TID) | ORAL | Status: DC

## 2014-10-19 MED ORDER — HYOSCYAMINE SULFATE ER 0.375 MG PO TBCR: EXTENDED_RELEASE_TABLET | ORAL | Status: DC

## 2014-10-19 NOTE — Progress Notes (Signed)
      History of Present Illness:  Ms. Cupit is now complaining of pain across her lower abdomen in the suprapubic area.  It is unrelated to eating or moving her bowels.  She has been taking dicyclomine without improvement.  This is different from the pain in the right lower quadrant that  she has complained of in the past.  She is also complaining of diarrhea again and anorexia.  CT scan in October, 2015 did not demonstrate any acute intra-abdominal abnormality is.    Review of Systems: Pertinent positive and negative review of systems were noted in the above HPI section. All other review of systems were otherwise negative.    Current Medications, Allergies, Past Medical History, Past Surgical History, Family History and Social History were reviewed in [place] record  Vital signs were reviewed in today's medical record. Physical Exam: General: Thin female in no acute distress On abdominal exam there is mild tenderness in the suprapubic area without guarding or rebound.  There is no organomegaly  See Assessment and Plan under Problem List

## 2014-10-19 NOTE — Assessment & Plan Note (Signed)
She is now completing of chronic suprapubic pain rather than right lower quadrant pain.  The latter was felt to be musculoskeletal.  Etiology for suprapubic pain is on clear.  I doubt this is an acute abdominal process.  Recommendations #1 trial of hyomax (DC dicyclomine)

## 2014-10-19 NOTE — Patient Instructions (Signed)
We have sent your prescriptions to your pharmacy Discontinue Dicyclomine Follow up on 11/22/2014 Monday at 3pm with [facility]

## 2014-10-19 NOTE — Assessment & Plan Note (Signed)
Plan empiric trial of pancreas pancreas

## 2014-10-23 ENCOUNTER — Other Ambulatory Visit: Payer: Self-pay | Admitting: Neurology

## 2014-10-30 ENCOUNTER — Other Ambulatory Visit: Payer: Self-pay | Admitting: Gastroenterology

## 2014-11-03 ENCOUNTER — Ambulatory Visit: Payer: Self-pay | Admitting: Physical Medicine & Rehabilitation

## 2014-11-04 ENCOUNTER — Ambulatory Visit: Payer: Self-pay | Admitting: Physical Medicine & Rehabilitation

## 2014-11-22 ENCOUNTER — Ambulatory Visit: Payer: Self-pay | Admitting: Physician Assistant

## 2014-11-25 ENCOUNTER — Ambulatory Visit: Payer: Self-pay | Admitting: Physician Assistant

## 2014-11-27 ENCOUNTER — Other Ambulatory Visit (HOSPITAL_COMMUNITY): Payer: Self-pay | Admitting: Psychiatry

## 2014-11-29 ENCOUNTER — Other Ambulatory Visit (HOSPITAL_COMMUNITY): Payer: Self-pay | Admitting: Psychiatry

## 2014-12-01 ENCOUNTER — Ambulatory Visit (INDEPENDENT_AMBULATORY_CARE_PROVIDER_SITE_OTHER): Payer: No Typology Code available for payment source | Admitting: Psychiatry

## 2014-12-01 ENCOUNTER — Encounter (HOSPITAL_COMMUNITY): Payer: Self-pay | Admitting: Psychiatry

## 2014-12-01 VITALS — BP 159/93 | HR 76 | Resp 14 | Wt 95.4 lb

## 2014-12-01 DIAGNOSIS — F411 Generalized anxiety disorder: Secondary | ICD-10-CM

## 2014-12-01 DIAGNOSIS — F419 Anxiety disorder, unspecified: Secondary | ICD-10-CM

## 2014-12-01 MED ORDER — ESCITALOPRAM OXALATE 20 MG PO TABS: 20.0000 mg | ORAL_TABLET | Freq: Every day | ORAL | Status: DC

## 2014-12-01 NOTE — Progress Notes (Signed)
[facility] [phone] Progress Note   Lindsey Gomez [id number] 69 y.o.  12/01/2014 4:40 PM  Chief Complaint:  Medication management and follow-up.        History of Present Illness:  Yittel came for her follow-up appointment with her aide.  Her aide reported that she is doing much better and take her medication as prescribed.  Her appetite is also improved from the past.  Patient also endorsed improvement in her mood and depressive symptoms.  She denies any irritability, crying spells.  She reported her energy level is improved from the past.  She still have chronic symptoms but they are stable.  She is taking multiple medication which includes lorazepam, Namenda, Lyrica, Topamax, melatonin and Trintellex from other providers.  She scheduled to see neurologist next month.  Patient denies any hallucination, paranoia, self abusive behavior.  She is not interested in counseling.  She was diagnosed Parkinson however she is not taking any medication for Parkinson at this time.  She denies drinking or using any illegal substances.  At times she has difficulty walking due to shuffling gait but she denies any other worsening of symptoms.  Her aide reported that overall she had improved in recent months.  Suicidal Ideation: No Plan Formed: No Patient has means to carry out plan: No  Homicidal Ideation: No Plan Formed: No Patient has means to carry out plan: No  Past Psychiatric History/Hospitalization(s) Patient and her caregiver denies any previous history of psychiatric inpatient treatment or any suicidal attempt.  As per chart patient has given Celexa, amitriptyline, Brintellix and Zoloft.    Recently we tried Remeron but patient did not see any improvement. Anxiety: Yes Bipolar Disorder: No Depression: Yes Mania: No Psychosis: No Schizophrenia: No Personality Disorder: No Hospitalization for psychiatric illness: No History of Electroconvulsive Shock Therapy: No Prior Suicide Attempts:  No  Past Medical History  Diagnosis Date  . Depression   . HTN (hypertension)   . Chronic pain   . Fibromyalgia   . Migraines   . History of breast cancer     Right  . Chronic pancreatitis   . Vitamin D deficiency   . GERD (gastroesophageal reflux disease)   . Finger dislocation 2009    L 3rd  . COPD (chronic obstructive pulmonary disease)   . Anemia   . Fractured sternum 11/2008  . Osteoporosis     Reclast too expensive  . Barrett's esophagus   . Chronic fatigue   . Diverticulosis   . Opioid dependence   . 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 Abnormality of gait 10/12/2013  . Anxiety   . Depression   . Type II or unspecified type diabetes mellitus without mention of complication, not stated as uncontrolled   . Pancreatitis   . Hypoglycemia   . Lung nodules   . Chronic kidney disease   . Parkinson disease   . Parkinson's disease 06/15/2014  . Convulsions/seizures 10/12/2013    Possible benzodiazepine withdrawal seizure, x1-no further seizure activity   . Cancer     HX OF BREAST CANCER -chemo, radiation-right   . Malnourished     Review of Systems  Constitutional: Negative for weight loss.  Skin: Negative for itching and rash.  Neurological: Positive for tremors.       Shuffling gait    Psychiatric: Agitation: No Hallucination: No Depressed Mood: No Insomnia: Yes Hypersomnia: No Altered Concentration: No Feels Worthless: No Grandiose Ideas: No Belief In Special Powers: No New/Increased Substance Abuse: No Compulsions: No  Neurologic: Headache: Yes Seizure: No Paresthesias: No   Musculoskeletal: Strength & Muscle Tone: spastic, decreased and atrophy Gait & Station: unsteady, shuffle Patient leans: Front and Backward  Constitutional:  BP 159/93 mmHg  Pulse 76  Resp 14  Wt 95 lb 6.4 oz (43.273 kg)   Mental Status Examination;  Patient is casually dressed and fairly groomed.  She  is pleasant and cooperative and maintained fair eye contact.  Her speech is  slow but coherent.  She described her mood neutral and her affect is improved from the past.  She usually answer yes or no.  Her psychomotor activity is decreased.  She denies any active or passive suicidal parts or homicidal thought.  There were no delusions or any paranoia.  Her fund of knowledge is below average.  Her attention concentration is fair.  She denies any auditory or visual hallucination.  Her cognition is okay but she does have difficulty remembering recent things.  Her gait is unsteady and shuffling.  She's alert and oriented 3.  Her insight judgment and impulse control is fair.   Established Problem, Stable/Improving (1), Review of Last Therapy Session (1) and Review of Medication Regimen & Side Effects (2)  Assessment: Axis I:  Anxiety disorder NOS, rule out depressive disorder single episode.  Axis II:  deferred  Axis III:  Past Medical History  Diagnosis Date  . Depression   . HTN (hypertension)   . Chronic pain   . Fibromyalgia   . Migraines   . History of breast cancer     Right  . Chronic pancreatitis   . Vitamin D deficiency   . GERD (gastroesophageal reflux disease)   . Finger dislocation 2009    L 3rd  . COPD (chronic obstructive pulmonary disease)   . Anemia   . Fractured sternum 11/2008  . Osteoporosis     Reclast too expensive  . Barrett's esophagus   . Chronic fatigue   . Diverticulosis   . Opioid dependence   . 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 Abnormality of gait 10/12/2013  . Anxiety   . Depression   . Type II or unspecified type diabetes mellitus without mention of complication, not stated as uncontrolled   . Pancreatitis   . Hypoglycemia   . Lung nodules   . Chronic kidney disease   . Parkinson disease   . Parkinson's disease 06/15/2014  . Convulsions/seizures 10/12/2013    Possible benzodiazepine withdrawal seizure, x1-no further seizure activity   . Cancer     HX OF BREAST CANCER -chemo, radiation-right   . Malnourished      Plan:   Patient is doing better and is stable on current dose of Lexapro 20 mg.  She is tolerating the medication without any side effects. Patient is taking brintellix, ativan, topomax, Lyrica, melatonin, Namenda from other provider.  Discussed polypharmacy and drug drug interaction.  Recommended to call us back if she has any question or any concern.  Follow-up in 3 months.  Patient is not interested in counseling.    ARFEEN,SYED T., MD 12/01/2014

## 2014-12-01 NOTE — Telephone Encounter (Signed)
Telephone call with patient's husband who reported patient was asleep but still planning to come in for scheduled evaluation this date at 4:30pm.  Informed patient's pharmacy has called and sent refill request for Lexapro and Dr. Adele Schilder would send this when patient is evaluated this date if continued.  Mr. Filion verified patient did not need order prior to appointment and stated they would be in later this date.  Clearence Ped, pharmacist back at [facility] to inform patient would be seen later this date and Dr. Adele Schilder would send in appropriate orders at that time.

## 2014-12-03 ENCOUNTER — Other Ambulatory Visit: Payer: Self-pay | Admitting: Gastroenterology

## 2014-12-08 ENCOUNTER — Other Ambulatory Visit: Payer: Self-pay | Admitting: Geriatric Medicine

## 2014-12-08 ENCOUNTER — Ambulatory Visit
Admission: RE | Admit: 2014-12-08 | Discharge: 2014-12-08 | Disposition: A | Discharge status: Home / Self Care | Payer: Medicare Other | Source: Ambulatory Visit | Attending: Geriatric Medicine | Admitting: Geriatric Medicine

## 2014-12-08 DIAGNOSIS — Z853 Personal history of malignant neoplasm of breast: Secondary | ICD-10-CM

## 2014-12-13 ENCOUNTER — Other Ambulatory Visit: Payer: Self-pay | Admitting: Gastroenterology

## 2015-01-05 ENCOUNTER — Other Ambulatory Visit: Payer: Self-pay | Admitting: Neurology

## 2015-01-05 NOTE — Telephone Encounter (Signed)
[name] the patient's caregiver is calling because the patient got a letter stating starting in January 2017 the medication zolmitriptan (ZOMIG) 5 MG tablet will need prior authorization for a larger quantity. Please call [phone] for prior authorization. Thank you.

## 2015-01-10 ENCOUNTER — Other Ambulatory Visit: Payer: Self-pay | Admitting: Gastroenterology

## 2015-01-17 NOTE — Telephone Encounter (Signed)
Dr Carlean Purl, You are doc of the day..... Will you refill this patients Zofran She saw Dr Deatra Ina in Aug

## 2015-01-18 NOTE — Telephone Encounter (Signed)
Refill x 3 

## 2015-01-20 NOTE — Telephone Encounter (Signed)
Med refilled.

## 2015-02-10 ENCOUNTER — Ambulatory Visit: Payer: Medicare Other | Admitting: Neurology

## 2015-02-10 ENCOUNTER — Ambulatory Visit: Payer: Medicare Other | Admitting: Adult Health

## 2015-02-14 ENCOUNTER — Telehealth: Payer: Self-pay | Admitting: Neurology

## 2015-02-14 NOTE — Telephone Encounter (Signed)
Spouse called to request refill but forgot the medication needed, will call back.

## 2015-02-16 ENCOUNTER — Telehealth: Payer: Self-pay | Admitting: Neurology

## 2015-02-16 MED ORDER — VORTIOXETINE HBR 20 MG PO TABS: ORAL_TABLET | ORAL | Status: DC

## 2015-02-16 NOTE — Telephone Encounter (Signed)
Pt called for refill on TRINTELLIX 20 MG TABS 90 day supply.

## 2015-02-20 ENCOUNTER — Other Ambulatory Visit: Payer: Self-pay | Admitting: Neurology

## 2015-02-20 ENCOUNTER — Other Ambulatory Visit (HOSPITAL_COMMUNITY): Payer: Self-pay | Admitting: Psychiatry

## 2015-02-23 ENCOUNTER — Encounter (HOSPITAL_COMMUNITY): Payer: Self-pay | Admitting: Psychiatry

## 2015-02-23 ENCOUNTER — Ambulatory Visit (INDEPENDENT_AMBULATORY_CARE_PROVIDER_SITE_OTHER): Payer: No Typology Code available for payment source | Admitting: Psychiatry

## 2015-02-23 VITALS — BP 130/92 | HR 60 | Wt 92.8 lb

## 2015-02-23 DIAGNOSIS — F411 Generalized anxiety disorder: Secondary | ICD-10-CM

## 2015-02-23 MED ORDER — ESCITALOPRAM OXALATE 20 MG PO TABS: 20.0000 mg | ORAL_TABLET | Freq: Every day | ORAL | Status: DC

## 2015-02-23 NOTE — Progress Notes (Signed)
[place] Progress Note   Lindsey Gomez [id number] y.o.  02/23/2015 4:39 PM  Chief Complaint:  Medication management and follow-up.        History of Present Illness:  Lindsey Gomez came for her follow-up appointment with her new aide.  Her previous aide found a new job.  Patient is somewhat disappointed but she also pleased that new aide is very helpful.  Lately she is complaining of nausea and diarrhea but no other concerns.  She believe her current psychiatric medication is working very well.  She does not want to change her medication.  Her energy level is good.  Her sleep is good.  She denies any irritability, crying spells, mania, psychosis or any hallucination.  Despite taking multiple medication she is denying any side effects including any tremors, shakes.  However she has shuffling gait and difficulty walking.  She has diagnosed with Parkinson but she is not taking any medication.  Patient denies any self abusive behavior or any aggression.  Patient denies drinking or using any illegal substances.  Suicidal Ideation: No Plan Formed: No Patient has means to carry out plan: No  Homicidal Ideation: No Plan Formed: No Patient has means to carry out plan: No  Past Psychiatric History/Hospitalization(s) Patient and her caregiver denies any previous history of psychiatric inpatient treatment or any suicidal attempt.  As per chart patient has given Celexa, amitriptyline, Brintellix and Zoloft.    Recently we tried Remeron but patient did not see any improvement. Anxiety: Yes Bipolar Disorder: No Depression: Yes Mania: No Psychosis: No Schizophrenia: No Personality Disorder: No Hospitalization for psychiatric illness: No History of Electroconvulsive Shock Therapy: No Prior Suicide Attempts: No  Past Medical History  Diagnosis Date  . Depression   . HTN (hypertension)   . Chronic pain   . Fibromyalgia   . Migraines   . History of breast cancer     Right  .  Chronic pancreatitis ([place])   . Vitamin D deficiency   . GERD (gastroesophageal reflux disease)   . Finger dislocation 2009    L 3rd  . COPD (chronic obstructive pulmonary disease) ([place])   . Anemia   . Fractured sternum 11/2008  . Osteoporosis     Reclast too expensive  . Barrett's esophagus   . Chronic fatigue   . Diverticulosis   . Opioid dependence ([place])   . 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 Abnormality of gait 10/12/2013  . Anxiety   . Depression   . Type II or unspecified type diabetes mellitus without mention of complication, not stated as uncontrolled   . Pancreatitis   . Hypoglycemia   . Lung nodules   . Chronic kidney disease   . Parkinson disease ([place])   . Parkinson's disease ([place]) 06/15/2014  . Convulsions/seizures ([place]) 10/12/2013    Possible benzodiazepine withdrawal seizure, x1-no further seizure activity   . Cancer ([place])     HX OF BREAST CANCER -chemo, radiation-right   . Malnourished ([place])     Review of Systems  Constitutional: Negative for weight loss.  Gastrointestinal: Positive for nausea.  Skin: Negative for itching and rash.  Neurological: Positive for tremors.       Shuffling gait    Psychiatric: Agitation: No Hallucination: No Depressed Mood: No Insomnia: No Hypersomnia: No Altered Concentration: No Feels Worthless: No Grandiose Ideas: No Belief In Special Powers: No New/Increased Substance Abuse: No Compulsions: No  Neurologic: Headache: Yes Seizure: No Paresthesias: No   Musculoskeletal: Strength & Muscle Tone:  spastic, decreased and atrophy Gait & Station: unsteady, shuffle Patient leans: Front and Backward  Constitutional:  BP 130/92 mmHg  Pulse 60  Wt 92 lb 12.8 oz (42.094 kg)   Mental Status Examination;  Patient is casually dressed and fairly groomed.  She  is pleasant and cooperative and maintained fair eye contact.  Her speech is slow but coherent.  She described her mood neutral and her affect is improved from the past.  She  usually answer yes or no.  Her psychomotor activity is decreased.  She denies any active or passive suicidal parts or homicidal thought.  There were no delusions or any paranoia.  Her fund of knowledge is below average.  Her attention concentration is fair.  She denies any auditory or visual hallucination.  Her cognition is okay but she does have difficulty remembering recent things.  Her gait is unsteady and shuffling.  She's alert and oriented 3.  Her insight judgment and impulse control is fair.   Established Problem, Stable/Improving (1), Review of Last Therapy Session (1) and Review of Medication Regimen & Side Effects (2)  Assessment: Axis I:  Anxiety disorder NOS, rule out depressive disorder single episode.  Axis II:  deferred  Axis III:  Past Medical History  Diagnosis Date  . Depression   . HTN (hypertension)   . Chronic pain   . Fibromyalgia   . Migraines   . History of breast cancer     Right  . Chronic pancreatitis ([place])   . Vitamin D deficiency   . GERD (gastroesophageal reflux disease)   . Finger dislocation 2009    L 3rd  . COPD (chronic obstructive pulmonary disease) ([place])   . Anemia   . Fractured sternum 11/2008  . Osteoporosis     Reclast too expensive  . Barrett's esophagus   . Chronic fatigue   . Diverticulosis   . Opioid dependence ([place])   . 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 Abnormality of gait 10/12/2013  . Anxiety   . Depression   . Type II or unspecified type diabetes mellitus without mention of complication, not stated as uncontrolled   . Pancreatitis   . Hypoglycemia   . Lung nodules   . Chronic kidney disease   . Parkinson disease ([place])   . Parkinson's disease ([place]) 06/15/2014  . Convulsions/seizures ([place]) 10/12/2013    Possible benzodiazepine withdrawal seizure, x1-no further seizure activity   . Cancer ([place])     HX OF BREAST CANCER -chemo, radiation-right   . Malnourished ([place])      Plan:  Patient is doing better and is stable on current dose of  Lexapro 20 mg.  I recommended if nausea persists then she should call her primary care physician.  Patient denies any side effects.  She is taking brintellix, ativan, topomax, Lyrica, melatonin, Namenda from other provider.  Discussed polypharmacy and drug drug interaction.  Recommended to call us back if she has any question or any concern.  Follow-up in 3 months.  Patient is not interested in counseling.    Lindsey Klunk T., MD 02/23/2015

## 2015-03-27 ENCOUNTER — Other Ambulatory Visit: Payer: Self-pay | Admitting: Neurology

## 2015-03-28 ENCOUNTER — Other Ambulatory Visit: Payer: Self-pay | Admitting: Nurse Practitioner

## 2015-03-28 DIAGNOSIS — R109 Unspecified abdominal pain: Secondary | ICD-10-CM

## 2015-03-28 DIAGNOSIS — R102 Pelvic and perineal pain: Secondary | ICD-10-CM

## 2015-03-28 DIAGNOSIS — R8290 Unspecified abnormal findings in urine: Secondary | ICD-10-CM

## 2015-03-28 DIAGNOSIS — R1084 Generalized abdominal pain: Secondary | ICD-10-CM

## 2015-03-28 DIAGNOSIS — R112 Nausea with vomiting, unspecified: Secondary | ICD-10-CM

## 2015-03-30 ENCOUNTER — Inpatient Hospital Stay (HOSPITAL_COMMUNITY)
Admission: EM | Admit: 2015-03-30 | Discharge: 2015-04-06 | DRG: 682 | Disposition: A | Discharge status: Home Health | Payer: Medicare Other | Attending: Family Medicine | Admitting: Family Medicine

## 2015-03-30 ENCOUNTER — Encounter (HOSPITAL_COMMUNITY): Payer: Self-pay

## 2015-03-30 DIAGNOSIS — M810 Age-related osteoporosis without current pathological fracture: Secondary | ICD-10-CM | POA: Diagnosis present

## 2015-03-30 DIAGNOSIS — K219 Gastro-esophageal reflux disease without esophagitis: Secondary | ICD-10-CM | POA: Diagnosis present

## 2015-03-30 DIAGNOSIS — Z794 Long term (current) use of insulin: Secondary | ICD-10-CM

## 2015-03-30 DIAGNOSIS — E43 Unspecified severe protein-calorie malnutrition: Secondary | ICD-10-CM | POA: Diagnosis present

## 2015-03-30 DIAGNOSIS — L8990 Pressure ulcer of unspecified site, unspecified stage: Secondary | ICD-10-CM | POA: Insufficient documentation

## 2015-03-30 DIAGNOSIS — Z882 Allergy status to sulfonamides status: Secondary | ICD-10-CM

## 2015-03-30 DIAGNOSIS — G43709 Chronic migraine without aura, not intractable, without status migrainosus: Secondary | ICD-10-CM | POA: Diagnosis present

## 2015-03-30 DIAGNOSIS — F329 Major depressive disorder, single episode, unspecified: Secondary | ICD-10-CM | POA: Diagnosis present

## 2015-03-30 DIAGNOSIS — Z825 Family history of asthma and other chronic lower respiratory diseases: Secondary | ICD-10-CM

## 2015-03-30 DIAGNOSIS — E872 Acidosis, unspecified: Secondary | ICD-10-CM

## 2015-03-30 DIAGNOSIS — F1120 Opioid dependence, uncomplicated: Secondary | ICD-10-CM | POA: Diagnosis present

## 2015-03-30 DIAGNOSIS — Z923 Personal history of irradiation: Secondary | ICD-10-CM

## 2015-03-30 DIAGNOSIS — R197 Diarrhea, unspecified: Secondary | ICD-10-CM | POA: Diagnosis present

## 2015-03-30 DIAGNOSIS — D72829 Elevated white blood cell count, unspecified: Secondary | ICD-10-CM | POA: Diagnosis present

## 2015-03-30 DIAGNOSIS — N179 Acute kidney failure, unspecified: Principal | ICD-10-CM

## 2015-03-30 DIAGNOSIS — R05 Cough: Secondary | ICD-10-CM | POA: Diagnosis not present

## 2015-03-30 DIAGNOSIS — E876 Hypokalemia: Secondary | ICD-10-CM | POA: Diagnosis present

## 2015-03-30 DIAGNOSIS — K861 Other chronic pancreatitis: Secondary | ICD-10-CM | POA: Diagnosis present

## 2015-03-30 DIAGNOSIS — L89102 Pressure ulcer of unspecified part of back, stage 2: Secondary | ICD-10-CM | POA: Diagnosis present

## 2015-03-30 DIAGNOSIS — Z79899 Other long term (current) drug therapy: Secondary | ICD-10-CM

## 2015-03-30 DIAGNOSIS — R1110 Vomiting, unspecified: Secondary | ICD-10-CM

## 2015-03-30 DIAGNOSIS — R112 Nausea with vomiting, unspecified: Secondary | ICD-10-CM | POA: Diagnosis not present

## 2015-03-30 DIAGNOSIS — E162 Hypoglycemia, unspecified: Secondary | ICD-10-CM

## 2015-03-30 DIAGNOSIS — G629 Polyneuropathy, unspecified: Secondary | ICD-10-CM | POA: Diagnosis present

## 2015-03-30 DIAGNOSIS — G20 Parkinson's disease: Secondary | ICD-10-CM | POA: Diagnosis present

## 2015-03-30 DIAGNOSIS — E11649 Type 2 diabetes mellitus with hypoglycemia without coma: Secondary | ICD-10-CM | POA: Diagnosis present

## 2015-03-30 DIAGNOSIS — M797 Fibromyalgia: Secondary | ICD-10-CM | POA: Diagnosis present

## 2015-03-30 DIAGNOSIS — E559 Vitamin D deficiency, unspecified: Secondary | ICD-10-CM | POA: Diagnosis present

## 2015-03-30 DIAGNOSIS — R634 Abnormal weight loss: Secondary | ICD-10-CM

## 2015-03-30 DIAGNOSIS — J449 Chronic obstructive pulmonary disease, unspecified: Secondary | ICD-10-CM | POA: Diagnosis present

## 2015-03-30 DIAGNOSIS — Z833 Family history of diabetes mellitus: Secondary | ICD-10-CM

## 2015-03-30 DIAGNOSIS — F0390 Unspecified dementia without behavioral disturbance: Secondary | ICD-10-CM | POA: Diagnosis present

## 2015-03-30 DIAGNOSIS — N390 Urinary tract infection, site not specified: Secondary | ICD-10-CM | POA: Diagnosis present

## 2015-03-30 DIAGNOSIS — Z853 Personal history of malignant neoplasm of breast: Secondary | ICD-10-CM

## 2015-03-30 DIAGNOSIS — G8929 Other chronic pain: Secondary | ICD-10-CM | POA: Diagnosis present

## 2015-03-30 DIAGNOSIS — R059 Cough, unspecified: Secondary | ICD-10-CM

## 2015-03-30 DIAGNOSIS — R109 Unspecified abdominal pain: Secondary | ICD-10-CM

## 2015-03-30 DIAGNOSIS — Z888 Allergy status to other drugs, medicaments and biological substances status: Secondary | ICD-10-CM

## 2015-03-30 DIAGNOSIS — F17210 Nicotine dependence, cigarettes, uncomplicated: Secondary | ICD-10-CM | POA: Diagnosis present

## 2015-03-30 DIAGNOSIS — E860 Dehydration: Secondary | ICD-10-CM | POA: Diagnosis present

## 2015-03-30 DIAGNOSIS — Z881 Allergy status to other antibiotic agents status: Secondary | ICD-10-CM

## 2015-03-30 DIAGNOSIS — D649 Anemia, unspecified: Secondary | ICD-10-CM | POA: Diagnosis present

## 2015-03-30 DIAGNOSIS — Z8249 Family history of ischemic heart disease and other diseases of the circulatory system: Secondary | ICD-10-CM

## 2015-03-30 LAB — URINE MICROSCOPIC-ADD ON

## 2015-03-30 LAB — COMPREHENSIVE METABOLIC PANEL
ALK PHOS: 83 U/L (ref 38–126)
ALT: 19 U/L (ref 14–54)
ANION GAP: 15 (ref 5–15)
AST: 20 U/L (ref 15–41)
Albumin: 2.7 g/dL — ABNORMAL LOW (ref 3.5–5.0)
BUN: 45 mg/dL — ABNORMAL HIGH (ref 6–20)
CALCIUM: 10 mg/dL (ref 8.9–10.3)
CO2: 14 mmol/L — AB (ref 22–32)
Chloride: 111 mmol/L (ref 101–111)
Creatinine, Ser: 2.67 mg/dL — ABNORMAL HIGH (ref 0.44–1.00)
GFR calc non Af Amer: 17 mL/min — ABNORMAL LOW (ref >60)
GFR, EST AFRICAN AMERICAN: 20 mL/min — AB (ref >60)
Glucose, Bld: 62 mg/dL — ABNORMAL LOW (ref 65–99)
POTASSIUM: 3.6 mmol/L (ref 3.5–5.1)
SODIUM: 140 mmol/L (ref 135–145)
TOTAL PROTEIN: 7.1 g/dL (ref 6.5–8.1)
Total Bilirubin: 0.4 mg/dL (ref 0.3–1.2)

## 2015-03-30 LAB — CBC
HCT: 43.4 % (ref 36.0–46.0)
HEMOGLOBIN: 14.2 g/dL (ref 12.0–15.0)
MCH: 31.5 pg (ref 26.0–34.0)
MCHC: 32.7 g/dL (ref 30.0–36.0)
MCV: 96.2 fL (ref 78.0–100.0)
Platelets: 349 10*3/uL (ref 150–400)
RBC: 4.51 MIL/uL (ref 3.87–5.11)
RDW: 17 % — ABNORMAL HIGH (ref 11.5–15.5)
WBC: 11.9 10*3/uL — ABNORMAL HIGH (ref 4.0–10.5)

## 2015-03-30 LAB — URINALYSIS, ROUTINE W REFLEX MICROSCOPIC
Bilirubin Urine: NEGATIVE
Glucose, UA: NEGATIVE mg/dL
Ketones, ur: 15 mg/dL — AB
NITRITE: NEGATIVE
Protein, ur: 30 mg/dL — AB
SPECIFIC GRAVITY, URINE: 1.022 (ref 1.005–1.030)
pH: 5 (ref 5.0–8.0)

## 2015-03-30 LAB — LIPASE, BLOOD: Lipase: 49 U/L (ref 11–51)

## 2015-03-30 MED ORDER — SODIUM CHLORIDE 0.9 % IV SOLN
1000.0000 mL | INTRAVENOUS | Status: DC
  Administered 2015-03-30: 1000 mL via INTRAVENOUS

## 2015-03-30 MED ORDER — MORPHINE SULFATE (PF) 4 MG/ML IV SOLN
4.0000 mg | INTRAVENOUS | Status: DC | PRN
  Administered 2015-03-30 – 2015-04-01 (×8): 4 mg via INTRAVENOUS
  Filled 2015-03-30 (×8): qty 1

## 2015-03-30 MED ORDER — SODIUM CHLORIDE 0.9 % IV SOLN
1000.0000 mL | Freq: Once | INTRAVENOUS | Status: AC
  Administered 2015-03-30: 1000 mL via INTRAVENOUS

## 2015-03-30 MED ORDER — MORPHINE SULFATE (PF) 4 MG/ML IV SOLN
4.0000 mg | Freq: Once | INTRAVENOUS | Status: AC
  Administered 2015-03-30: 4 mg via INTRAVENOUS
  Filled 2015-03-30: qty 1

## 2015-03-30 MED ORDER — ONDANSETRON HCL 4 MG/2ML IJ SOLN
4.0000 mg | Freq: Once | INTRAMUSCULAR | Status: AC
  Administered 2015-03-30: 4 mg via INTRAVENOUS
  Filled 2015-03-30: qty 2

## 2015-03-30 MED ORDER — IOHEXOL 300 MG/ML  SOLN: 25.0000 mL | INTRAMUSCULAR | Status: AC

## 2015-03-30 MED ORDER — DEXTROSE 50 % IV SOLN
12.5000 g | Freq: Once | INTRAVENOUS | Status: AC
  Administered 2015-03-30: 12.5 g via INTRAVENOUS
  Filled 2015-03-30: qty 50

## 2015-03-30 NOTE — ED Provider Notes (Signed)
CSN: [id number]     Arrival date & time 03/30/15  1616 History   First MD Initiated Contact with Patient 03/30/15 2130     Chief Complaint  Patient presents with  . Abdominal Pain     (Consider location/radiation/quality/duration/timing/severity/associated sxs/prior Treatment) Patient is a 70 y.o. female presenting with abdominal pain. The history is provided by the patient.  Abdominal Pain She complains of generalized abdominal pain and vomiting for the last 3 weeks. She has lost a significant amount of weight although she could not tell me how much. She is also having diarrhea. She has no appetite and when she does eat, she vomits when she does eat. There is been no blood in stool or emesis. She denies fever or sweats but is having chills. She saw her gastroenterologist today who sent her to the emergency department. She rates pain at 10/10. Nothing makes it better nothing makes it worse.  Past Medical History  Diagnosis Date  . Depression   . HTN (hypertension)   . Chronic pain   . Fibromyalgia   . Migraines   . History of breast cancer     Right  . Chronic pancreatitis ([place])   . Vitamin D deficiency   . GERD (gastroesophageal reflux disease)   . Finger dislocation 2009    L 3rd  . COPD (chronic obstructive pulmonary disease) ([place])   . Anemia   . Fractured sternum 11/2008  . Osteoporosis     Reclast too expensive  . Barrett's esophagus   . Chronic fatigue   . Diverticulosis   . Opioid dependence ([place])   . 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 Abnormality of gait 10/12/2013  . Anxiety   . Depression   . Type II or unspecified type diabetes mellitus without mention of complication, not stated as uncontrolled   . Pancreatitis   . Hypoglycemia   . Lung nodules   . Chronic kidney disease   . Parkinson disease ([place])   . Parkinson's disease ([place]) 06/15/2014  . Convulsions/seizures ([place]) 10/12/2013    Possible benzodiazepine withdrawal seizure, x1-no further seizure activity   . Cancer  ([place])     HX OF BREAST CANCER -chemo, radiation-right   . Malnourished [facility])    Past Surgical History  Procedure Laterality Date  . Nasal sinus surgery    . Pancreatectomy      40%  . Cholecystectomy    . Appendectomy    . Abdominal hysterectomy    . Lobectomy Left 2005    granulomatous lesion.   . Breast lumpectomy Left   . Ercp N/A 10/05/2013    Procedure: ENDOSCOPIC RETROGRADE CHOLANGIOPANCREATOGRAPHY (ERCP);  Surgeon: Ladene Artist, MD;  Location: [facility] ENDOSCOPY;  Service: Endoscopy;  Laterality: N/A;  . Cataract extraction Bilateral   . Botox injection      recent botox injection 12-23-13 injection for Migraines  . Cataract extraction, bilateral Bilateral     bilateral  . Eye surgery      1 eye "membrane surgery"  . Ercp N/A 01/07/2014    Procedure: ENDOSCOPIC RETROGRADE CHOLANGIOPANCREATOGRAPHY (ERCP);  Surgeon: Inda Castle, MD;  Location: [facility] ENDOSCOPY;  Service: Endoscopy;  Laterality: N/A;  . Spyglass cholangioscopy N/A 01/07/2014    Procedure: [id number] CHOLANGIOSCOPY;  Surgeon: Inda Castle, MD;  Location: [facility] ENDOSCOPY;  Service: Endoscopy;  Laterality: N/A;  . Colonoscopy    . Upper gastrointestinal endoscopy     Family History  Problem Relation Age of Onset  . COPD Father   .  Heart disease Father   . CAD Father   . Cancer Father     "blood"  . Leukemia Mother   . Diabetes Maternal Uncle   . Dementia Sister   . Alzheimer's disease Sister   . Heart attack Brother   . Colon cancer Neg Hx   . Colon polyps Neg Hx   . Esophageal cancer Neg Hx    Social History  Substance Use Topics  . Smoking status: Current Every Day Smoker -- 1.00 packs/day for 40 years    Types: Cigarettes    Last Attempt to Quit: 01/25/2014  . Smokeless tobacco: Never Used  . Alcohol Use: No   OB History    No data available     Review of Systems  Gastrointestinal: Positive for abdominal pain.  All other systems reviewed and are negative.     Allergies  Milnacipran;  Rizatriptan benzoate; Gabapentin; Moxifloxacin; Sulfonamide derivatives; and Venlafaxine  Home Medications   Prior to Admission medications   Medication Sig Start Date End Date Taking? Authorizing Provider  ACCU-CHEK AVIVA PLUS test strip  07/21/14   Historical Provider, MD  acetaminophen (TYLENOL) 500 MG tablet Take 500 mg by mouth every 6 (six) hours as needed.    Historical Provider, MD  atenolol (TENORMIN) 100 MG tablet Take 1 tablet (100 mg total) by mouth every morning. 05/15/13   Aleksei Plotnikov V, MD  atorvastatin (LIPITOR) 10 MG tablet Take 10 mg by mouth daily.  06/03/14   Historical Provider, MD  BD PEN NEEDLE NANO U/F 32G X 4 MM MISC  07/22/14   Historical Provider, MD  Botulinum Toxin Type A 200 UNITS SOLR Inject 200 Units as directed every 3 (three) months.    Historical Provider, MD  docusate sodium (COLACE) 100 MG capsule Take 100 mg by mouth daily as needed for mild constipation.    Historical Provider, MD  escitalopram (LEXAPRO) 20 MG tablet Take 1 tablet (20 mg total) by mouth daily. 02/23/15 02/23/16  Kathlee Nations, MD  ferrous sulfate 325 (65 FE) MG tablet Take 325 mg by mouth daily with breakfast.    Historical Provider, MD  hydrocortisone (CORTEF) 10 MG tablet Take 10 mg by mouth 2 (two) times daily.  03/17/14   Historical Provider, MD  hyoscyamine (LEVBID) 0.375 MG 12 hr tablet TAKE ONE TAB TWICE A DAY 12/13/14   Inda Castle, MD  insulin aspart (NOVOLOG) 100 UNIT/ML FlexPen Inject 0-8 Units into the skin 3 (three) times daily with meals. 70-120 = 0 units, 121-150 = 1 unit, 151-200 = 2 units, 201-250 = 3 units, 251-300 = 5 units, 301-350 = 7 units. 01/13/13   Renato Shin, MD  isometheptene-acetaminophen-dichloralphenazone (MIDRIN) [phone] MG capsule Take 1 capsule by mouth 4 (four) times daily as needed for migraine. Maximum 5 capsules in 12 hours for migraine headaches, 8 capsules in 24 hours for tension headaches.    Historical Provider, MD  LEVEMIR FLEXTOUCH 100 UNIT/ML  Pen Inject 11 Units into the skin at bedtime.  03/31/14   Historical Provider, MD  lipase/protease/amylase (CREON) 12000 UNITS CPEP capsule Take 1 capsule (12,000 Units total) by mouth 3 (three) times daily before meals. 10/19/14   Inda Castle, MD  LORazepam (ATIVAN) 1 MG tablet Take 1 tablet (1 mg total) by mouth 2 (two) times daily. 03/15/14   Theodis Blaze, MD  Melatonin 10 MG TABS Take 1 tablet by mouth at bedtime.    Historical Provider, MD  Misc Natural Products (DAILY HERBS  VISION HEALTH PO) Take by mouth daily.    Historical Provider, MD  mometasone (NASONEX) 50 MCG/ACT nasal spray Place 2 sprays into both nostrils daily as needed (for congestion).     Historical Provider, MD  NAMENDA XR 28 MG CP24 24 hr capsule TAKE ONE CAPSULE BY MOUTH DAILY 10/02/14   Kathrynn Ducking, MD  omeprazole (PRILOSEC) 40 MG capsule Take 1 capsule (40 mg total) by mouth 2 (two) times daily. Patient taking differently: Take 40 mg by mouth daily.  07/17/13   Aleksei Plotnikov V, MD  ondansetron (ZOFRAN) 8 MG tablet TAKE 1 TABLET (8 MG TOTAL) BY MOUTH 2 (TWO) TIMES DAILY. 01/20/15   Gatha Mayer, MD  pregabalin (LYRICA) 50 MG capsule Take 1 capsule (50 mg total) by mouth 2 (two) times daily. 06/15/14   Kathrynn Ducking, MD  Probiotic Product (PROBIOTIC DAILY PO) Take by mouth daily.    Historical Provider, MD  topiramate (TOPAMAX) 100 MG tablet TAKE 1 TABLET BY MOUTH TWICE DAILY 09/23/14   Aleksei Plotnikov V, MD  triamcinolone (KENALOG) 0.1 % ointment Apply 1 application topically 2 (two) times daily as needed (itching).     Historical Provider, MD  Vortioxetine HBr (TRINTELLIX) 20 MG TABS TAKE 20 MG BY MOUTH DAILY. 02/16/15   Kathrynn Ducking, MD  zolmitriptan (ZOMIG) 5 MG tablet Take 1 tablet (5 mg total) by mouth as needed for migraine (max 2 tabs daily). 03/27/15   Kathrynn Ducking, MD   BP 114/88 mmHg  Pulse 92  Temp(Src) 97.2 F (36.2 C) (Oral)  Resp 16  SpO2 99% Physical Exam  Nursing note and vitals  reviewed.  Malnourished appearing 70 year old female, resting comfortably and in no acute distress. Vital signs are normal. Oxygen saturation is 99%, which is normal. Head is normocephalic and atraumatic. PERRLA, EOMI. Oropharynx is clear. Neck is nontender and supple without adenopathy or JVD. Back is nontender and there is no CVA tenderness. Lungs are clear without rales, wheezes, or rhonchi. Chest is nontender. Heart has regular rate and rhythm without murmur. Abdomen is soft, scaphoid, with mild tenderness diffusely. No localized tenderness, no masses or hepatosplenomegaly and peristalsis is normoactive. Extremities have no cyanosis or edema, full range of motion is present. Skin is warm and dry without rash. Neurologic: Mental status is normal, cranial nerves are intact, there are no motor or sensory deficits.  ED Course  Procedures (including critical care time) Labs Review Results for orders placed or performed during the hospital encounter of 03/30/15  Lipase, blood  Result Value Ref Range   Lipase 49 11 - 51 U/L  Comprehensive metabolic panel  Result Value Ref Range   Sodium 140 135 - 145 mmol/L   Potassium 3.6 3.5 - 5.1 mmol/L   Chloride 111 101 - 111 mmol/L   CO2 14 (L) 22 - 32 mmol/L   Glucose, Bld 62 (L) 65 - 99 mg/dL   BUN 45 (H) 6 - 20 mg/dL   Creatinine, Ser 2.67 (H) 0.44 - 1.00 mg/dL   Calcium 10.0 8.9 - 10.3 mg/dL   Total Protein 7.1 6.5 - 8.1 g/dL   Albumin 2.7 (L) 3.5 - 5.0 g/dL   AST 20 15 - 41 U/L   ALT 19 14 - 54 U/L   Alkaline Phosphatase 83 38 - 126 U/L   Total Bilirubin 0.4 0.3 - 1.2 mg/dL   GFR calc non Af Amer 17 (L) >60 mL/min   GFR calc Af Amer 20 (L) >60  mL/min   Anion gap 15 5 - 15  CBC  Result Value Ref Range   WBC 11.9 (H) 4.0 - 10.5 K/uL   RBC 4.51 3.87 - 5.11 MIL/uL   Hemoglobin 14.2 12.0 - 15.0 g/dL   HCT 43.4 36.0 - 46.0 %   MCV 96.2 78.0 - 100.0 fL   MCH 31.5 26.0 - 34.0 pg   MCHC 32.7 30.0 - 36.0 g/dL   RDW 17.0 (H) 11.5 - 15.5  %   Platelets 349 150 - 400 K/uL  Urinalysis, Routine w reflex microscopic (not at [facility])  Result Value Ref Range   Color, Urine YELLOW YELLOW   APPearance CLEAR CLEAR   Specific Gravity, Urine 1.022 1.005 - 1.030   pH 5.0 5.0 - 8.0   Glucose, UA NEGATIVE NEGATIVE mg/dL   Hgb urine dipstick MODERATE (A) NEGATIVE   Bilirubin Urine NEGATIVE NEGATIVE   Ketones, ur 15 (A) NEGATIVE mg/dL   Protein, ur 30 (A) NEGATIVE mg/dL   Nitrite NEGATIVE NEGATIVE   Leukocytes, UA MODERATE (A) NEGATIVE  Urine microscopic-add on  Result Value Ref Range   Squamous Epithelial / LPF 6-30 (A) NONE SEEN   WBC, UA TOO NUMEROUS TO COUNT 0 - 5 WBC/hpf   RBC / HPF 6-30 0 - 5 RBC/hpf   Bacteria, UA FEW (A) NONE SEEN   Casts HYALINE CASTS (A) NEGATIVE   Crystals CA OXALATE CRYSTALS (A) NEGATIVE   Urine-Other YEAST PRESENT    I have personally reviewed and evaluated these images and lab results as part of my medical decision-making.   MDM   Final diagnoses:  Abdominal pain, unspecified abdominal location  Intractable vomiting with nausea, vomiting of unspecified type  Acute kidney injury (nontraumatic) (HCC)  Metabolic acidosis  Hypoglycemia  Weight loss    Abdominal pain with vomiting and weight loss. Laboratory workup shows renal failure which is new compared with last February. Hypoglycemia is noted and she is given a small amount of glucose intravenously. She's given IV hydration with sent for CT of abdomen and pelvis. She will need hospital admission. Old records are reviewed and she has no relevant past visits.  Additional significant laboratory findings include a metabolic acidosis which is presumably related to her acute renal failure. She is noted to have too numerous to count WBCs in her urine but few bacteria and yeast is present. I do not believe this actually represents a urinary tract infection but specimen has been sent for culture. CT scan is pending. Case is signed out to Dr. Betsey Holiday to  evaluate CT report and arrange hospital admission.  Delora Fuel, MD [id number]

## 2015-03-30 NOTE — ED Notes (Signed)
Pt sent here by her GI doctor for abdominal pain, N/V/D. She states she cant hold anything down and has lost weight.

## 2015-03-31 ENCOUNTER — Other Ambulatory Visit: Payer: Self-pay

## 2015-03-31 ENCOUNTER — Inpatient Hospital Stay (HOSPITAL_COMMUNITY): Payer: Medicare Other

## 2015-03-31 ENCOUNTER — Emergency Department (HOSPITAL_COMMUNITY): Payer: Medicare Other

## 2015-03-31 ENCOUNTER — Encounter (HOSPITAL_COMMUNITY): Payer: Self-pay | Admitting: Radiology

## 2015-03-31 DIAGNOSIS — R112 Nausea with vomiting, unspecified: Secondary | ICD-10-CM | POA: Diagnosis not present

## 2015-03-31 DIAGNOSIS — Z833 Family history of diabetes mellitus: Secondary | ICD-10-CM | POA: Diagnosis not present

## 2015-03-31 DIAGNOSIS — D72829 Elevated white blood cell count, unspecified: Secondary | ICD-10-CM | POA: Diagnosis present

## 2015-03-31 DIAGNOSIS — M810 Age-related osteoporosis without current pathological fracture: Secondary | ICD-10-CM | POA: Diagnosis present

## 2015-03-31 DIAGNOSIS — G43709 Chronic migraine without aura, not intractable, without status migrainosus: Secondary | ICD-10-CM | POA: Diagnosis present

## 2015-03-31 DIAGNOSIS — R197 Diarrhea, unspecified: Secondary | ICD-10-CM | POA: Diagnosis not present

## 2015-03-31 DIAGNOSIS — E876 Hypokalemia: Secondary | ICD-10-CM | POA: Diagnosis not present

## 2015-03-31 DIAGNOSIS — Z794 Long term (current) use of insulin: Secondary | ICD-10-CM | POA: Diagnosis not present

## 2015-03-31 DIAGNOSIS — R1030 Lower abdominal pain, unspecified: Secondary | ICD-10-CM | POA: Diagnosis not present

## 2015-03-31 DIAGNOSIS — E860 Dehydration: Secondary | ICD-10-CM | POA: Diagnosis present

## 2015-03-31 DIAGNOSIS — Z888 Allergy status to other drugs, medicaments and biological substances status: Secondary | ICD-10-CM | POA: Diagnosis not present

## 2015-03-31 DIAGNOSIS — Z825 Family history of asthma and other chronic lower respiratory diseases: Secondary | ICD-10-CM | POA: Diagnosis not present

## 2015-03-31 DIAGNOSIS — N179 Acute kidney failure, unspecified: Secondary | ICD-10-CM | POA: Diagnosis not present

## 2015-03-31 DIAGNOSIS — E43 Unspecified severe protein-calorie malnutrition: Secondary | ICD-10-CM | POA: Diagnosis present

## 2015-03-31 DIAGNOSIS — R109 Unspecified abdominal pain: Secondary | ICD-10-CM | POA: Diagnosis not present

## 2015-03-31 DIAGNOSIS — Z923 Personal history of irradiation: Secondary | ICD-10-CM | POA: Diagnosis not present

## 2015-03-31 DIAGNOSIS — L89102 Pressure ulcer of unspecified part of back, stage 2: Secondary | ICD-10-CM | POA: Diagnosis present

## 2015-03-31 DIAGNOSIS — D649 Anemia, unspecified: Secondary | ICD-10-CM | POA: Diagnosis present

## 2015-03-31 DIAGNOSIS — G629 Polyneuropathy, unspecified: Secondary | ICD-10-CM | POA: Diagnosis present

## 2015-03-31 DIAGNOSIS — E559 Vitamin D deficiency, unspecified: Secondary | ICD-10-CM | POA: Diagnosis present

## 2015-03-31 DIAGNOSIS — J449 Chronic obstructive pulmonary disease, unspecified: Secondary | ICD-10-CM | POA: Diagnosis present

## 2015-03-31 DIAGNOSIS — E11649 Type 2 diabetes mellitus with hypoglycemia without coma: Secondary | ICD-10-CM | POA: Diagnosis present

## 2015-03-31 DIAGNOSIS — Z853 Personal history of malignant neoplasm of breast: Secondary | ICD-10-CM | POA: Diagnosis not present

## 2015-03-31 DIAGNOSIS — R05 Cough: Secondary | ICD-10-CM | POA: Diagnosis present

## 2015-03-31 DIAGNOSIS — G20 Parkinson's disease: Secondary | ICD-10-CM | POA: Diagnosis present

## 2015-03-31 DIAGNOSIS — F0390 Unspecified dementia without behavioral disturbance: Secondary | ICD-10-CM | POA: Diagnosis present

## 2015-03-31 DIAGNOSIS — F329 Major depressive disorder, single episode, unspecified: Secondary | ICD-10-CM | POA: Diagnosis present

## 2015-03-31 DIAGNOSIS — K861 Other chronic pancreatitis: Secondary | ICD-10-CM | POA: Diagnosis not present

## 2015-03-31 DIAGNOSIS — M797 Fibromyalgia: Secondary | ICD-10-CM | POA: Diagnosis present

## 2015-03-31 DIAGNOSIS — F1120 Opioid dependence, uncomplicated: Secondary | ICD-10-CM | POA: Diagnosis present

## 2015-03-31 DIAGNOSIS — Z79899 Other long term (current) drug therapy: Secondary | ICD-10-CM | POA: Diagnosis not present

## 2015-03-31 DIAGNOSIS — F17210 Nicotine dependence, cigarettes, uncomplicated: Secondary | ICD-10-CM | POA: Diagnosis present

## 2015-03-31 DIAGNOSIS — Z881 Allergy status to other antibiotic agents status: Secondary | ICD-10-CM | POA: Diagnosis not present

## 2015-03-31 DIAGNOSIS — Z882 Allergy status to sulfonamides status: Secondary | ICD-10-CM | POA: Diagnosis not present

## 2015-03-31 DIAGNOSIS — K219 Gastro-esophageal reflux disease without esophagitis: Secondary | ICD-10-CM | POA: Diagnosis present

## 2015-03-31 DIAGNOSIS — Z8249 Family history of ischemic heart disease and other diseases of the circulatory system: Secondary | ICD-10-CM | POA: Diagnosis not present

## 2015-03-31 DIAGNOSIS — R1110 Vomiting, unspecified: Secondary | ICD-10-CM

## 2015-03-31 DIAGNOSIS — G8929 Other chronic pain: Secondary | ICD-10-CM | POA: Diagnosis present

## 2015-03-31 DIAGNOSIS — E162 Hypoglycemia, unspecified: Secondary | ICD-10-CM

## 2015-03-31 DIAGNOSIS — N390 Urinary tract infection, site not specified: Secondary | ICD-10-CM | POA: Diagnosis present

## 2015-03-31 LAB — BASIC METABOLIC PANEL
ANION GAP: 14 (ref 5–15)
Anion gap: 17 — ABNORMAL HIGH (ref 5–15)
BUN: 40 mg/dL — ABNORMAL HIGH (ref 6–20)
BUN: 29 mg/dL — ABNORMAL HIGH (ref 6–20)
CALCIUM: 9 mg/dL (ref 8.9–10.3)
CALCIUM: 9 mg/dL (ref 8.9–10.3)
CHLORIDE: 120 mmol/L — AB (ref 101–111)
CO2: 12 mmol/L — AB (ref 22–32)
CO2: 9 mmol/L — AB (ref 22–32)
CREATININE: 2.21 mg/dL — AB (ref 0.44–1.00)
Chloride: 114 mmol/L — ABNORMAL HIGH (ref 101–111)
Creatinine, Ser: 1.78 mg/dL — ABNORMAL HIGH (ref 0.44–1.00)
GFR calc non Af Amer: 22 mL/min — ABNORMAL LOW (ref >60)
GFR calc non Af Amer: 28 mL/min — ABNORMAL LOW (ref >60)
GFR, EST AFRICAN AMERICAN: 25 mL/min — AB (ref >60)
GFR, EST AFRICAN AMERICAN: 33 mL/min — AB (ref >60)
GLUCOSE: 138 mg/dL — AB (ref 65–99)
Glucose, Bld: 139 mg/dL — ABNORMAL HIGH (ref 65–99)
POTASSIUM: 4.6 mmol/L (ref 3.5–5.1)
Potassium: 3.1 mmol/L — ABNORMAL LOW (ref 3.5–5.1)
Sodium: 143 mmol/L (ref 135–145)
Sodium: 143 mmol/L (ref 135–145)

## 2015-03-31 LAB — LACTIC ACID, PLASMA
LACTIC ACID, VENOUS: 1.2 mmol/L (ref 0.5–2.0)
Lactic Acid, Venous: 1.4 mmol/L (ref 0.5–2.0)

## 2015-03-31 LAB — URINALYSIS, ROUTINE W REFLEX MICROSCOPIC
BILIRUBIN URINE: NEGATIVE
Glucose, UA: NEGATIVE mg/dL
Hgb urine dipstick: NEGATIVE
KETONES UR: 15 mg/dL — AB
LEUKOCYTES UA: NEGATIVE
NITRITE: NEGATIVE
PH: 5 (ref 5.0–8.0)
PROTEIN: 30 mg/dL — AB
Specific Gravity, Urine: 1.02 (ref 1.005–1.030)

## 2015-03-31 LAB — GLUCOSE, CAPILLARY
GLUCOSE-CAPILLARY: 108 mg/dL — AB (ref 65–99)
GLUCOSE-CAPILLARY: 150 mg/dL — AB (ref 65–99)

## 2015-03-31 LAB — CBC
HEMATOCRIT: 39.3 % (ref 36.0–46.0)
Hemoglobin: 12.8 g/dL (ref 12.0–15.0)
MCH: 31.4 pg (ref 26.0–34.0)
MCHC: 32.6 g/dL (ref 30.0–36.0)
MCV: 96.3 fL (ref 78.0–100.0)
Platelets: 278 10*3/uL (ref 150–400)
RBC: 4.08 MIL/uL (ref 3.87–5.11)
RDW: 17.1 % — AB (ref 11.5–15.5)
WBC: 12.6 10*3/uL — ABNORMAL HIGH (ref 4.0–10.5)

## 2015-03-31 LAB — URINE MICROSCOPIC-ADD ON

## 2015-03-31 LAB — CBG MONITORING, ED
GLUCOSE-CAPILLARY: 118 mg/dL — AB (ref 65–99)
Glucose-Capillary: 153 mg/dL — ABNORMAL HIGH (ref 65–99)

## 2015-03-31 LAB — C DIFFICILE QUICK SCREEN W PCR REFLEX
C DIFFICILE (CDIFF) TOXIN: NEGATIVE
C DIFFICLE (CDIFF) ANTIGEN: POSITIVE — AB

## 2015-03-31 LAB — MAGNESIUM: Magnesium: 2.1 mg/dL (ref 1.7–2.4)

## 2015-03-31 MED ORDER — ATORVASTATIN CALCIUM 10 MG PO TABS
10.0000 mg | ORAL_TABLET | Freq: Every day | ORAL | Status: DC
  Administered 2015-04-01 – 2015-04-06 (×6): 10 mg via ORAL
  Filled 2015-03-31 (×7): qty 1

## 2015-03-31 MED ORDER — SODIUM CHLORIDE 0.9 % IV SOLN
INTRAVENOUS | Status: DC
  Administered 2015-03-31: 08:00:00 via INTRAVENOUS

## 2015-03-31 MED ORDER — ACETAMINOPHEN 500 MG PO TABS: 500.0000 mg | ORAL_TABLET | Freq: Four times a day (QID) | ORAL | Status: DC | PRN

## 2015-03-31 MED ORDER — ACETAMINOPHEN 650 MG RE SUPP
650.0000 mg | Freq: Four times a day (QID) | RECTAL | Status: DC | PRN
  Filled 2015-03-31: qty 1

## 2015-03-31 MED ORDER — POTASSIUM CHLORIDE 10 MEQ/100ML IV SOLN
10.0000 meq | INTRAVENOUS | Status: AC
  Administered 2015-03-31 (×2): 10 meq via INTRAVENOUS
  Filled 2015-03-31 (×2): qty 100

## 2015-03-31 MED ORDER — GABAPENTIN 100 MG PO CAPS
100.0000 mg | ORAL_CAPSULE | Freq: Two times a day (BID) | ORAL | Status: DC
  Administered 2015-03-31 – 2015-04-06 (×13): 100 mg via ORAL
  Filled 2015-03-31 (×14): qty 1

## 2015-03-31 MED ORDER — ONDANSETRON HCL 4 MG/2ML IJ SOLN
4.0000 mg | Freq: Once | INTRAMUSCULAR | Status: AC
  Administered 2015-03-31: 4 mg via INTRAVENOUS
  Filled 2015-03-31: qty 2

## 2015-03-31 MED ORDER — INSULIN ASPART 100 UNIT/ML ~~LOC~~ SOLN
0.0000 [IU] | Freq: Three times a day (TID) | SUBCUTANEOUS | Status: DC
  Administered 2015-03-31: 2 [IU] via SUBCUTANEOUS
  Administered 2015-04-01: 3 [IU] via SUBCUTANEOUS
  Administered 2015-04-01: 2 [IU] via SUBCUTANEOUS
  Administered 2015-04-02: 1 [IU] via SUBCUTANEOUS
  Administered 2015-04-02 – 2015-04-04 (×6): 2 [IU] via SUBCUTANEOUS
  Administered 2015-04-04: 1 [IU] via SUBCUTANEOUS
  Administered 2015-04-04: 2 [IU] via SUBCUTANEOUS
  Administered 2015-04-05: 1 [IU] via SUBCUTANEOUS
  Administered 2015-04-05 (×2): 5 [IU] via SUBCUTANEOUS
  Administered 2015-04-06: 1 [IU] via SUBCUTANEOUS
  Administered 2015-04-06: 5 [IU] via SUBCUTANEOUS
  Filled 2015-03-31: qty 1

## 2015-03-31 MED ORDER — INSULIN ASPART 100 UNIT/ML ~~LOC~~ SOLN: 0.0000 [IU] | Freq: Every day | SUBCUTANEOUS | Status: DC

## 2015-03-31 MED ORDER — DEXTROSE 5 % IV SOLN
1.0000 g | INTRAVENOUS | Status: DC
  Administered 2015-03-31 – 2015-04-02 (×3): 1 g via INTRAVENOUS
  Filled 2015-03-31 (×3): qty 10

## 2015-03-31 MED ORDER — LORAZEPAM 1 MG PO TABS
1.0000 mg | ORAL_TABLET | Freq: Three times a day (TID) | ORAL | Status: DC | PRN
Start: 2015-03-31 — End: 2015-04-06
  Administered 2015-04-02 – 2015-04-06 (×8): 1 mg via ORAL
  Filled 2015-03-31 (×8): qty 1

## 2015-03-31 MED ORDER — HYDROCORTISONE 10 MG PO TABS
10.0000 mg | ORAL_TABLET | Freq: Two times a day (BID) | ORAL | Status: DC
Start: 2015-03-31 — End: 2015-04-06
  Administered 2015-03-31 – 2015-04-06 (×12): 10 mg via ORAL
  Filled 2015-03-31 (×19): qty 1

## 2015-03-31 MED ORDER — ATENOLOL 50 MG PO TABS
50.0000 mg | ORAL_TABLET | Freq: Every day | ORAL | Status: DC
  Administered 2015-04-01 – 2015-04-06 (×6): 50 mg via ORAL
  Filled 2015-03-31 (×7): qty 1

## 2015-03-31 MED ORDER — ENSURE ENLIVE PO LIQD
237.0000 mL | Freq: Two times a day (BID) | ORAL | Status: DC
  Administered 2015-04-01 – 2015-04-06 (×7): 237 mL via ORAL

## 2015-03-31 MED ORDER — FLUCONAZOLE IN SODIUM CHLORIDE 200-0.9 MG/100ML-% IV SOLN: 200.0000 mg | Freq: Once | INTRAVENOUS | Status: DC

## 2015-03-31 MED ORDER — SODIUM BICARBONATE 8.4 % IV SOLN
INTRAVENOUS | Status: DC
  Administered 2015-03-31 – 2015-04-01 (×2): via INTRAVENOUS
  Filled 2015-03-31 (×13): qty 100

## 2015-03-31 MED ORDER — ONDANSETRON HCL 4 MG/2ML IJ SOLN
4.0000 mg | Freq: Once | INTRAMUSCULAR | Status: AC
  Administered 2015-03-31: 4 mg via INTRAVENOUS

## 2015-03-31 MED ORDER — DEXTROSE-NACL 5-0.45 % IV SOLN: INTRAVENOUS | Status: DC

## 2015-03-31 MED ORDER — FERROUS SULFATE 325 (65 FE) MG PO TABS
325.0000 mg | ORAL_TABLET | Freq: Every day | ORAL | Status: DC
  Administered 2015-04-01 – 2015-04-06 (×6): 325 mg via ORAL
  Filled 2015-03-31 (×8): qty 1

## 2015-03-31 MED ORDER — HYDROCORTISONE NA SUCCINATE PF 100 MG IJ SOLR
100.0000 mg | Freq: Once | INTRAMUSCULAR | Status: AC
  Administered 2015-03-31: 100 mg via INTRAVENOUS
  Filled 2015-03-31: qty 2

## 2015-03-31 MED ORDER — MEMANTINE HCL ER 7 MG PO CP24
7.0000 mg | ORAL_CAPSULE | Freq: Two times a day (BID) | ORAL | Status: DC
  Administered 2015-03-31 – 2015-04-06 (×13): 7 mg via ORAL
  Filled 2015-03-31 (×18): qty 1

## 2015-03-31 MED ORDER — FLUCONAZOLE IN SODIUM CHLORIDE 200-0.9 MG/100ML-% IV SOLN
200.0000 mg | Freq: Once | INTRAVENOUS | Status: AC
  Administered 2015-03-31: 200 mg via INTRAVENOUS
  Filled 2015-03-31: qty 100

## 2015-03-31 MED ORDER — PANTOPRAZOLE SODIUM 40 MG PO TBEC
40.0000 mg | DELAYED_RELEASE_TABLET | Freq: Every day | ORAL | Status: DC
  Administered 2015-04-01 – 2015-04-06 (×6): 40 mg via ORAL
  Filled 2015-03-31 (×7): qty 1

## 2015-03-31 MED ORDER — MELATONIN 10 MG PO TABS: 1.0000 | ORAL_TABLET | Freq: Every day | ORAL | Status: DC

## 2015-03-31 MED ORDER — HEPARIN SODIUM (PORCINE) 5000 UNIT/ML IJ SOLN
5000.0000 [IU] | Freq: Three times a day (TID) | INTRAMUSCULAR | Status: DC
  Administered 2015-03-31 – 2015-04-04 (×13): 5000 [IU] via SUBCUTANEOUS
  Filled 2015-03-31 (×16): qty 1

## 2015-03-31 MED ORDER — PROCHLORPERAZINE EDISYLATE 5 MG/ML IJ SOLN
10.0000 mg | Freq: Four times a day (QID) | INTRAMUSCULAR | Status: DC | PRN
  Administered 2015-03-31 – 2015-04-06 (×14): 10 mg via INTRAVENOUS
  Filled 2015-03-31 (×21): qty 2

## 2015-03-31 MED ORDER — ONDANSETRON HCL 4 MG/2ML IJ SOLN
INTRAMUSCULAR | Status: AC
  Filled 2015-03-31: qty 2

## 2015-03-31 MED ORDER — SERTRALINE HCL 50 MG PO TABS
25.0000 mg | ORAL_TABLET | Freq: Every day | ORAL | Status: DC
  Administered 2015-04-01 – 2015-04-06 (×6): 25 mg via ORAL
  Filled 2015-03-31 (×7): qty 1

## 2015-03-31 MED ORDER — FLUTICASONE PROPIONATE 50 MCG/ACT NA SUSP
1.0000 | Freq: Every day | NASAL | Status: DC
  Administered 2015-04-03 – 2015-04-06 (×4): 1 via NASAL
  Filled 2015-03-31 (×2): qty 16

## 2015-03-31 MED ORDER — IPRATROPIUM-ALBUTEROL 0.5-2.5 (3) MG/3ML IN SOLN: 3.0000 mL | RESPIRATORY_TRACT | Status: DC | PRN

## 2015-03-31 MED ORDER — DOCUSATE SODIUM 100 MG PO CAPS: 100.0000 mg | ORAL_CAPSULE | Freq: Every day | ORAL | Status: DC | PRN

## 2015-03-31 MED ORDER — ACETAMINOPHEN 325 MG PO TABS
650.0000 mg | ORAL_TABLET | Freq: Four times a day (QID) | ORAL | Status: DC | PRN
  Administered 2015-04-01 – 2015-04-05 (×9): 650 mg via ORAL
  Filled 2015-03-31 (×10): qty 2

## 2015-03-31 MED ORDER — PREGABALIN 50 MG PO CAPS
50.0000 mg | ORAL_CAPSULE | Freq: Two times a day (BID) | ORAL | Status: DC
  Administered 2015-03-31 – 2015-04-06 (×13): 50 mg via ORAL
  Filled 2015-03-31 (×14): qty 1

## 2015-03-31 MED ORDER — ESCITALOPRAM OXALATE 20 MG PO TABS
20.0000 mg | ORAL_TABLET | Freq: Every day | ORAL | Status: DC
  Administered 2015-04-01 – 2015-04-06 (×6): 20 mg via ORAL
  Filled 2015-03-31: qty 1
  Filled 2015-03-31: qty 2
  Filled 2015-03-31 (×5): qty 1

## 2015-03-31 NOTE — ED Notes (Signed)
Pt continues to decline to take medication despite anti-emetic given.  Pt also declining food.

## 2015-03-31 NOTE — Progress Notes (Signed)
New Admission Note: from Ed  Arrival Method: stretcher Mental Orientation: a/o X4 Telemetry: placed Assessment: Completed Skin: clean dry intact IV: LAC NS @100  Pain: RN aware of pain will administer pain meds Tubes: n/a Safety Measures: Safety Fall Prevention Plan has been given, discussed and signed Admission: Completed Unit Orientation: Patient has been orientated to the room, unit and staff.  Family: patient's friend at bedside upon arrival  Orders have been reviewed and implemented. Will continue to monitor the patient. Call light has been placed within reach and bed alarm has been activated.   Retta Mac BSN, RN

## 2015-03-31 NOTE — ED Notes (Signed)
Patient being transported to Ultrasound and then to the room, communicated to the transporter.

## 2015-03-31 NOTE — Progress Notes (Addendum)
ANTIBIOTIC CONSULT NOTE - INITIAL  Pharmacy Consult for Ceftriaxone  Indication: UTI  Allergies  Allergen Reactions  . Milnacipran Swelling and Other (See Comments)    Headache and hand swelling   . Rizatriptan Benzoate Nausea And Vomiting  . Gabapentin Other (See Comments)    Weak muscles  . Moxifloxacin Other (See Comments)    Unknown  . Sulfonamide Derivatives Rash  . Venlafaxine Other (See Comments)    Unknown    Vital Signs: BP: 111/76 mmHg (01/19 0445) Pulse Rate: 90 (01/19 0445)  Labs:  Recent Labs  03/30/15 1644 03/31/15 0328  WBC 11.9* 12.6*  HGB 14.2 12.8  PLT 349 278  CREATININE 2.67* 2.21*    Assessment: Ceftriaxone for UTI, WBC mildly elevated, abnormal U/A  Plan:  -Ceftriaxone 1g IV q24h -[name] urine culture  Narda Bonds 03/31/2015,5:41 AM   ADDENDUM  Ceftriaxone does not need renal adjustment.  [employer] policy allows pharmacy to change the ordered dose based on indication without contacting the physician, therefore a consult is not needed.

## 2015-03-31 NOTE — ED Notes (Signed)
Breakfast has arrived. Visitor present, and reports patient is not to be receiving narcotics per her primary MD Dr. Felipa Eth, her Marianne Sofia who was her old caregiver, her husband and daughter.

## 2015-03-31 NOTE — Progress Notes (Addendum)
Patient c/o  Abdominal pain, reported was taken off narcotic by PMD which she was not happy about it,. She reported n/v/d for the last several weeks. Reported productive cough with thick yellow sputum. No hypoxia, patient dose has forceful productive cough, no significant ab pain on exam, ? Drug seeking? cxr ordered. N/v/d from narcotic withdrawal?  She is significant dehydrated, reported recent abx use, will check c diff, will get renal US due to ARF. UA ? uti with yeast, on abx/diflucan. Metabolic acidosis, patient not taking oral meds consistently, will change ivf to bicarb.  Repeat labs in am.   Addendum: renal US with urinary retention, foley inserted.

## 2015-03-31 NOTE — ED Notes (Signed)
Patient transported to CT scan . 

## 2015-03-31 NOTE — Evaluation (Signed)
Physical Therapy Evaluation Patient Details Name: Lindsey Gomez MRN: [id number] DOB: 1945/10/30 Today's Date: 03/31/2015   History of Present Illness  Patient is a 70 yo female admitted 03/30/15 with abdominal pain, nausea, vomiting, AKI.   PMH: chronic pancreatitis, HTN, chronic pain, opioid dependence, depression, anxiety, COPD, DM, Parkinson's  Clinical Impression  Patient presents with problems listed below.  Will benefit from acute PT to maximize functional mobility prior to discharge home with husband.  Patient with generalized weakness impacting balance and safety with gait.  Recommend HHPT f/u at discharge for balance training.    Follow Up Recommendations Home health PT;Supervision for mobility/OOB    Equipment Recommendations  3in1 (PT)    Recommendations for Other Services       Precautions / Restrictions Precautions Precautions: Fall Restrictions Weight Bearing Restrictions: No      Mobility  Bed Mobility Overal bed mobility: Needs Assistance Bed Mobility: Supine to Sit;Sit to Supine     Supine to sit: Min assist Sit to supine: Min assist   General bed mobility comments: Verbal cues for technique.  Assist  to raise trunk to sitting position.  Assist to bring LE's onto bed to return to supine.  Assist to scoot to [facility].  Transfers Overall transfer level: Needs assistance Equipment used: 1 person hand held assist Transfers: Sit to/from Stand Sit to Stand: Min assist         General transfer comment: Assist to rise to standing from bed and toilet, and for balance/safety.  Ambulation/Gait Ambulation/Gait assistance: Min assist Ambulation Distance (Feet): 24 Feet Assistive device: 1 person hand held assist Gait Pattern/deviations: Step-through pattern;Decreased step length - right;Decreased step length - left;Shuffle;Trunk flexed Gait velocity: decreased Gait velocity interpretation: Below normal speed for age/gender General Gait Details: Patient with slow,  unsteady gait, requiring assistance to maintain balance.  Stairs            Wheelchair Mobility    Modified Rankin (Stroke Patients Only)       Balance Overall balance assessment: Needs assistance Sitting-balance support: No upper extremity supported;Feet supported Sitting balance-Leahy Scale: Good     Standing balance support: Single extremity supported Standing balance-Leahy Scale: Poor                               Pertinent Vitals/Pain Pain Assessment: 0-10 Pain Score: 5  Pain Location: Abdomen and back Pain Descriptors / Indicators: Aching Pain Intervention(s): Monitored during session;Repositioned    Home Living Family/patient expects to be discharged to:: Private residence Living Arrangements: Spouse/significant other Available Help at Discharge: Family;Personal care attendant;Neighbor;Available 24 hours/day (PCA present 4 days/week, 4 hours/day. Neighbor helps Sat's.) Type of Home: House Home Access: Stairs to enter Entrance Stairs-Rails: Psychiatric nurse of Steps: 3 Home Layout: One level Home Equipment: Environmental consultant - 2 wheels;Cane - single point;Shower seat Additional Comments: Patient reports Aide assists her with bathing/dressing, housekeeping, meal prep, errands/transport.  Neighbor does grocery shopping for patient on Saturdays.    Prior Function Level of Independence: Independent;Needs assistance   Gait / Transfers Assistance Needed: Patient reports she does not use RW or cane.  However she does hold on to furniture and walls when walking in their home.  ADL's / Homemaking Assistance Needed: Aide assists with bathing, meal prep, housekeeping.        Hand Dominance        Extremity/Trunk Assessment   Upper Extremity Assessment: Generalized weakness  Lower Extremity Assessment: Generalized weakness      Cervical / Trunk Assessment: Kyphotic  Communication   Communication: No difficulties   Cognition Arousal/Alertness: Awake/alert Behavior During Therapy: Anxious;Flat affect Overall Cognitive Status: Within Functional Limits for tasks assessed                      General Comments      Exercises        Assessment/Plan    PT Assessment Patient needs continued PT services  PT Diagnosis Difficulty walking;Abnormality of gait;Generalized weakness;Acute pain   PT Problem List Decreased strength;Decreased activity tolerance;Decreased balance;Decreased mobility;Decreased knowledge of use of DME;Decreased safety awareness;Pain  PT Treatment Interventions DME instruction;Gait training;Stair training;Functional mobility training;Therapeutic activities;Therapeutic exercise;Balance training;Patient/family education   PT Goals (Current goals can be found in the Care Plan section) Acute Rehab PT Goals Patient Stated Goal: To decrease pain PT Goal Formulation: With patient Time For Goal Achievement: 04/07/15 Potential to Achieve Goals: Good    Frequency Min 3X/week   Barriers to discharge        Co-evaluation               End of Session Equipment Utilized During Treatment: Gait belt Activity Tolerance: Patient limited by fatigue;Patient limited by pain Patient left: in bed;with call bell/phone within reach Nurse Communication: Mobility status (Request for nausea meds)         Time: [id number] PT Time Calculation (min) (ACUTE ONLY): 20 min   Charges:   PT Evaluation $PT Eval Moderate Complexity: 1 Procedure     PT G CodesDespina Pole April 26, 2015, 7:07 PM Carita Pian. Sanjuana Kava, [place] Pager [phone]

## 2015-03-31 NOTE — ED Provider Notes (Signed)
Patient signed out to me to follow-up on CT scan. Patient seen for diffuse abdominal pain with nausea, vomiting and diarrhea that has been ongoing for more than a week. She does have a history of chronic pancreatitis. Blood work did not show significant abnormalities other than evidence of acute kidney injury secondary to dehydration and prerenal azotemia. Urinalysis does show evidence of infection, culture has been sent. Patient initiated on Rocephin. CT scan abdomen and pelvis did not show any acute abnormalities. Patient will be admitted for further management of acute kidney injury and urinary tract infection.  Orpah Greek, MD 03/31/15 [phone]

## 2015-03-31 NOTE — ED Notes (Signed)
scd's applied, and explained need. Patient is complaint and wearing scd's.

## 2015-03-31 NOTE — ED Notes (Signed)
Pt resting and snoring.   Woke pt to give medication, pt states "I'm not taking anything by mouth because I'll throw up, I'm not taking anything".

## 2015-03-31 NOTE — H&P (Addendum)
[facility] History and Physical  Lindsey Gomez [id number] DOB: January 17, 1946 DOA: 03/30/2015  Referring physician: ED PCP: Mathews Argyle, MD   Chief Complaint:  Abdominal pain, nausea, vomiting  HPI:  Lindsey Gomez is a 70 year old female with a past medical history significant for chronic pancreatitis, hypertension, chronic pain, migraines, opioid dependence, history of breast cancer s/p lumpectomy, radiation, and chemotherapy; who presents with complaints of abdominal pain with nausea and vomiting over the last 3 weeks. Seen by gastroenterologist today and thereafter sent to the emergency department. She complains of weight loss, but does not know how much. She reports constant abdominal pain unable to keep any food or liquids down. Denies any chest pain, palpitations, fever, blood in stool, or blood in urine. She describes the pain in her abdomen as sharp and it is generalized. Pain is a 10 out of 10 despite being given 4 mg of morphine in the ED. Nothing makes the pain completely go away. Initial lab work showed lipase of 49, creatinine 2.67, BUN 40, and a WBC of 11.9. UA had multiple squamous epithelial cells which could have made it a contaminant.   Review of Systems  Constitutional: Positive for weight loss and malaise/fatigue. Negative for fever.  HENT: Negative for ear pain and tinnitus.   Eyes: Negative for double vision and photophobia.  Respiratory: Negative for cough and hemoptysis.   Cardiovascular: Negative for chest pain, palpitations and leg swelling.  Gastrointestinal: Positive for nausea, vomiting and abdominal pain.  Genitourinary: Positive for dysuria. Negative for frequency.  Musculoskeletal: Positive for joint pain. Negative for falls.  Neurological: Positive for weakness. Negative for sensory change and speech change.  Endo/Heme/Allergies: Negative for environmental allergies.  Psychiatric/Behavioral: Negative for hallucinations. The patient has insomnia.        Past Medical History  Diagnosis Date  . Depression   . HTN (hypertension)   . Chronic pain   . Fibromyalgia   . Migraines   . History of breast cancer     Right  . Chronic pancreatitis ([place])   . Vitamin D deficiency   . GERD (gastroesophageal reflux disease)   . Finger dislocation 2009    L 3rd  . COPD (chronic obstructive pulmonary disease) ([place])   . Anemia   . Fractured sternum 11/2008  . Osteoporosis     Reclast too expensive  . Barrett's esophagus   . Chronic fatigue   . Diverticulosis   . Opioid dependence ([place])   . 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 Abnormality of gait 10/12/2013  . Anxiety   . Depression   . Type II or unspecified type diabetes mellitus without mention of complication, not stated as uncontrolled   . Pancreatitis   . Hypoglycemia   . Lung nodules   . Chronic kidney disease   . Parkinson disease ([place])   . Parkinson's disease ([place]) 06/15/2014  . Convulsions/seizures ([place]) 10/12/2013    Possible benzodiazepine withdrawal seizure, x1-no further seizure activity   . Cancer ([place])     HX OF BREAST CANCER -chemo, radiation-right   . Malnourished [facility])      Past Surgical History  Procedure Laterality Date  . Nasal sinus surgery    . Pancreatectomy      40%  . Cholecystectomy    . Appendectomy    . Abdominal hysterectomy    . Lobectomy Left 2005    granulomatous lesion.   . Breast lumpectomy Left   . Ercp N/A 10/05/2013    Procedure: ENDOSCOPIC RETROGRADE CHOLANGIOPANCREATOGRAPHY (ERCP);  Surgeon: Ladene Artist, MD;  Location: [facility] ENDOSCOPY;  Service: Endoscopy;  Laterality: N/A;  . Cataract extraction Bilateral   . Botox injection      recent botox injection 12-23-13 injection for Migraines  . Cataract extraction, bilateral Bilateral     bilateral  . Eye surgery      1 eye "membrane surgery"  . Ercp N/A 01/07/2014    Procedure: ENDOSCOPIC RETROGRADE CHOLANGIOPANCREATOGRAPHY (ERCP);  Surgeon: Inda Castle, MD;  Location: [facility] ENDOSCOPY;   Service: Endoscopy;  Laterality: N/A;  . Spyglass cholangioscopy N/A 01/07/2014    Procedure: [id number] CHOLANGIOSCOPY;  Surgeon: Inda Castle, MD;  Location: [facility] ENDOSCOPY;  Service: Endoscopy;  Laterality: N/A;  . Colonoscopy    . Upper gastrointestinal endoscopy        Social History:  reports that she has been smoking Cigarettes.  She has a 40 pack-year smoking history. She has never used smokeless tobacco. She reports that she does not drink alcohol or use illicit drugs.   Allergies  Allergen Reactions  . Milnacipran Swelling and Other (See Comments)    Headache and hand swelling   . Rizatriptan Benzoate Nausea And Vomiting  . Gabapentin Other (See Comments)    Weak muscles  . Moxifloxacin Other (See Comments)    Unknown  . Sulfonamide Derivatives Rash  . Venlafaxine Other (See Comments)    Unknown    Family History  Problem Relation Age of Onset  . COPD Father   . Heart disease Father   . CAD Father   . Cancer Father     "blood"  . Leukemia Mother   . Diabetes Maternal Uncle   . Dementia Sister   . Alzheimer's disease Sister   . Heart attack Brother   . Colon cancer Neg Hx   . Colon polyps Neg Hx   . Esophageal cancer Neg Hx        Prior to Admission medications   Medication Sig Start Date End Date Taking? Authorizing Provider  acetaminophen (TYLENOL) 500 MG tablet Take 500 mg by mouth every 6 (six) hours as needed.   Yes Historical Provider, MD  atenolol (TENORMIN) 100 MG tablet Take 1 tablet (100 mg total) by mouth every morning. Patient taking differently: Take 50 mg by mouth daily.  05/15/13  Yes Aleksei Plotnikov V, MD  atorvastatin (LIPITOR) 10 MG tablet Take 10 mg by mouth daily.  06/03/14  Yes Historical Provider, MD  docusate sodium (COLACE) 100 MG capsule Take 100 mg by mouth daily as needed for mild constipation.   Yes Historical Provider, MD  doxycycline (VIBRA-TABS) 100 MG tablet Take 100 mg by mouth 2 (two) times daily.   Yes Historical Provider,  MD  escitalopram (LEXAPRO) 20 MG tablet Take 1 tablet (20 mg total) by mouth daily. 02/23/15 02/23/16 Yes Kathlee Nations, MD  ferrous sulfate 325 (65 FE) MG tablet Take 325 mg by mouth daily with breakfast.   Yes Historical Provider, MD  gabapentin (NEURONTIN) 100 MG capsule TAKE ONE CAPSULE BY MOUTH TWICE DAILY FOR 90 DAYS 01/17/15  Yes Historical Provider, MD  hydrocortisone (CORTEF) 10 MG tablet Take 10 mg by mouth 2 (two) times daily.  03/17/14  Yes Historical Provider, MD  hyoscyamine (LEVBID) 0.375 MG 12 hr tablet TAKE ONE TAB TWICE A DAY 12/13/14  Yes Inda Castle, MD  insulin aspart (NOVOLOG) 100 UNIT/ML FlexPen Inject 0-8 Units into the skin 3 (three) times daily with meals. 70-120 = 0 units, 121-150 = 1 unit,  151-200 = 2 units, 201-250 = 3 units, 251-300 = 5 units, 301-350 = 7 units. 01/13/13  Yes Renato Shin, MD  KLOR-CON M10 10 MEQ tablet TAKE 1 TABLET WITH FOOD ONCE A DAY ORALLY 30 DAY(S) 03/18/15  Yes Historical Provider, MD  LEVEMIR FLEXTOUCH 100 UNIT/ML Pen Inject 11 Units into the skin at bedtime.  03/31/14  Yes Historical Provider, MD  LORazepam (ATIVAN) 1 MG tablet Take 1 tablet (1 mg total) by mouth 2 (two) times daily. Patient taking differently: Take 1 mg by mouth 3 (three) times daily.  03/15/14  Yes Theodis Blaze, MD  Melatonin 10 MG TABS Take 1 tablet by mouth at bedtime.   Yes Historical Provider, MD  memantine (NAMENDA XR) 7 MG CP24 24 hr capsule Take 7 mg by mouth 2 (two) times daily.   Yes Historical Provider, MD  Misc Natural Products ([place] PO) Take by mouth daily.   Yes Historical Provider, MD  mometasone (NASONEX) 50 MCG/ACT nasal spray Place 2 sprays into both nostrils daily as needed (for congestion).    Yes Historical Provider, MD  omeprazole (PRILOSEC) 40 MG capsule Take 1 capsule (40 mg total) by mouth 2 (two) times daily. Patient taking differently: Take 40 mg by mouth daily.  07/17/13  Yes Aleksei Plotnikov V, MD  ondansetron (ZOFRAN-ODT) 8 MG  disintegrating tablet Take 1 tablet by mouth every 8 (eight) hours as needed. 03/17/15  Yes Historical Provider, MD  pregabalin (LYRICA) 50 MG capsule Take 1 capsule (50 mg total) by mouth 2 (two) times daily. 06/15/14  Yes Kathrynn Ducking, MD  Probiotic Product (PROBIOTIC DAILY PO) Take by mouth daily.   Yes Historical Provider, MD  sertraline (ZOLOFT) 25 MG tablet Take 25 mg by mouth daily.   Yes Historical Provider, MD  topiramate (TOPAMAX) 100 MG tablet TAKE 1 TABLET BY MOUTH TWICE DAILY 09/23/14  Yes Aleksei Plotnikov V, MD  triamcinolone (KENALOG) 0.1 % ointment Apply 1 application topically 2 (two) times daily as needed (itching).    Yes Historical Provider, MD  ACCU-CHEK AVIVA PLUS test strip  07/21/14   Historical Provider, MD  BD PEN NEEDLE NANO U/F 32G X 4 MM MISC  07/22/14   Historical Provider, MD  Botulinum Toxin Type A 200 UNITS SOLR Inject 200 Units as directed every 3 (three) months. Reported on 03/30/2015    Historical Provider, MD  isometheptene-acetaminophen-dichloralphenazone (MIDRIN) 65-325-100 MG capsule Take 1 capsule by mouth 4 (four) times daily as needed for migraine. Maximum 5 capsules in 12 hours for migraine headaches, 8 capsules in 24 hours for tension headaches.    Historical Provider, MD  lipase/protease/amylase (CREON) 12000 UNITS CPEP capsule Take 1 capsule (12,000 Units total) by mouth 3 (three) times daily before meals. Patient not taking: Reported on 03/30/2015 10/19/14   Inda Castle, MD  NAMENDA XR 28 MG CP24 24 hr capsule TAKE ONE CAPSULE BY MOUTH DAILY Patient not taking: Reported on 03/30/2015 10/02/14   Kathrynn Ducking, MD  ondansetron (ZOFRAN) 8 MG tablet TAKE 1 TABLET (8 MG TOTAL) BY MOUTH 2 (TWO) TIMES DAILY. Patient not taking: Reported on 03/30/2015 01/20/15   Gatha Mayer, MD  Vortioxetine HBr (TRINTELLIX) 20 MG TABS TAKE 20 MG BY MOUTH DAILY. Patient not taking: Reported on 03/30/2015 02/16/15   Kathrynn Ducking, MD  zolmitriptan (ZOMIG) 5 MG tablet Take 1  tablet (5 mg total) by mouth as needed for migraine (max 2 tabs daily). 03/27/15   [place] Alas  Jannifer Franklin, MD     Physical Exam: Filed Vitals:   03/31/15 0200 03/31/15 0215 03/31/15 0230 03/31/15 0245  BP: 123/80 128/76 117/78 111/76  Pulse: 82 87 89 87  Temp:      TempSrc:      Resp:      SpO2: 100% 100% 100% 99%     Constitutional: Vital signs reviewed. Patient is a chronically ill appearing frail female in moderate distress and cooperative with exam. Alert and oriented x3.  Head: Normocephalic and atraumatic  Ear: TM normal bilaterally  Mouth: no erythema or exudates, dry mucous membranes Eyes: PERRL, EOMI, conjunctivae normal, No scleral icterus.  Neck: Supple, Trachea midline normal ROM, No JVD, mass, thyromegaly, or carotid bruit present.  Cardiovascular: RRR, S1 normal, S2 normal, no MRG, pulses symmetric and intact bilaterally  Pulmonary/Chest decreased overall air movement Abdominal: Generalized tenderness to palpation scalpel and abdomen bowel sounds decreased  GU: no CVA tenderness Musculoskeletal: No joint deformities. signs of muscle atrophy. No  erythema or stiffness, ROM full and no nontender Ext: no edema and no cyanosis, pulses palpable bilaterally (DP and PT)  Hematology: no cervical, inginal, or axillary adenopathy.  Neurological: A&O x3, Strenght is normal and symmetric bilaterally, cranial nerve II-XII are grossly intact, no focal motor deficit, sensory intact to light touch bilaterally.  Skin: Warm, dry and intact. No rash, cyanosis, or clubbing.  Psychiatric: Normal mood and affect. speech and behavior is normal. Judgment and thought content normal. Cognition and memory are normal.      Data Review   Micro Results No results found for this or any previous visit (from the past 240 hour(s)).  Radiology Reports Ct Abdomen Pelvis Wo Contrast  03/31/2015  CLINICAL DATA:  Acute onset of generalized abdominal pain, nausea, vomiting and diarrhea. Loss of weight.  Initial encounter. EXAM: CT ABDOMEN AND PELVIS WITHOUT CONTRAST TECHNIQUE: Multidetector CT imaging of the abdomen and pelvis was performed following the standard protocol without IV contrast. COMPARISON:  CT of the abdomen and pelvis performed 12/31/2013, and renal ultrasound performed 04/10/2014 FINDINGS: Nodular opacity at the lung bases may reflect multifocal infection, as it is largely new from 2015. Underlying bronchiectasis is noted. Scattered coronary artery calcifications are seen. The liver and spleen are unremarkable in appearance. The patient is status post cholecystectomy, with clips noted along the gallbladder fossa. There is distention of the common bile duct to 1.7 cm, with underlying intrahepatic biliary ductal prominence. Scattered calcification is noted at the pancreatic head, reflecting sequelae of chronic pancreatitis. The adrenal glands are grossly unremarkable in appearance. Scattered calcifications at the renal hila are thought to be vascular in nature. An apparent 1.2 cm cyst is suggested at the interpole region of the left kidney. Mild nonspecific perinephric stranding is noted bilaterally. There is no evidence of hydronephrosis. No obstructing ureteral stones are identified. No free fluid is identified. The small bowel is unremarkable in appearance. The stomach is filled with contrast and is within normal limits. Surrounding postoperative change is noted. No acute vascular abnormalities are seen. Diffuse calcification is noted along the abdominal aorta and its branches. The patient is status post appendectomy. The colon is largely filled with fluid and air, and is grossly unremarkable in appearance. The bladder is moderately distended and grossly unremarkable. The patient is status post hysterectomy. No suspicious adnexal masses are seen. No inguinal lymphadenopathy is seen. No acute osseous abnormalities are identified. IMPRESSION: 1. Nodular opacity at the lung bases may reflect  multifocal infection, as it is new  from 2015. Underlying bronchiectasis noted. 2. Scattered calcification at the pancreatic head, reflecting sequelae of chronic pancreatitis. 3. Left renal cyst noted. 4. Scattered coronary artery calcifications seen. 5. Diffuse calcification along the abdominal aorta and its branches. 6. Colon largely filled with fluid and air, and grossly unremarkable in appearance. Electronically Signed   By: Garald Balding M.D.   On: 03/31/2015 01:59     CBC  Recent Labs Lab 03/30/15 1644  WBC 11.9*  HGB 14.2  HCT 43.4  PLT 349  MCV 96.2  MCH 31.5  MCHC 32.7  RDW 17.0*    Chemistries   Recent Labs Lab 03/30/15 1644  NA 140  K 3.6  CL 111  CO2 14*  GLUCOSE 62*  BUN 45*  CREATININE 2.67*  CALCIUM 10.0  AST 20  ALT 19  ALKPHOS 83  BILITOT 0.4   ------------------------------------------------------------------------------------------------------------------ CrCl cannot be calculated (Unknown ideal weight.). ------------------------------------------------------------------------------------------------------------------ No results for input(s): HGBA1C in the last 72 hours. ------------------------------------------------------------------------------------------------------------------ No results for input(s): CHOL, HDL, LDLCALC, TRIG, CHOLHDL, LDLDIRECT in the last 72 hours. ------------------------------------------------------------------------------------------------------------------ No results for input(s): TSH, T4TOTAL, T3FREE, THYROIDAB in the last 72 hours.  Invalid input(s): FREET3 ------------------------------------------------------------------------------------------------------------------ No results for input(s): VITAMINB12, FOLATE, FERRITIN, TIBC, IRON, RETICCTPCT in the last 72 hours.  Coagulation profile No results for input(s): INR, PROTIME in the last 168 hours.  No results for input(s): DDIMER in the last 72 hours.  Cardiac  Enzymes No results for input(s): CKMB, TROPONINI, MYOGLOBIN in the last 168 hours.  Invalid input(s): CK ------------------------------------------------------------------------------------------------------------------ Invalid input(s): POCBNP   CBG: No results for input(s): GLUCAP in the last 168 hours.     EKG: Ordered   Assessment/Plan Principal Problem: Abdominal pain with nausea & vomiting: Appears to be acute on chronic. CT of abdomen shows distention of the common bile duct to 1.7 cm. question of patient's nausea vomiting symptoms could be secondary to opioid withdrawals she has a history of dependence. - Admit to a MedSurg bed - May consider consultation GI for further recommendations of the distended common bile duct - Zofran and Compazine as needed for nausea - IV fluids of normal saline   AKI (acute kidney injury) [facility]): Patient with baseline normal kidney function acutely elevated creatinine to 2.65 with BUN elevated at 45.  -  IV normal saline fluids had 125 mL per hour - Recheck BMP    PANCREATITIS, CHRONIC lipase noted to be 47  - Otherwise see above    Hypoglycemia with diabetes mellitus type 2: Glucose 62 given a half amp of D50 and ED - Hold home insulin   -  sensitive sliding scale insulin for now - Hypoglycemic protocols start D50 in 1/2 normal saline if needed   Opioid dependence (HCC)    Diarrhea:  - Strict ins and outs    Hypokalemia: Acute. Potassium low at 3.1 - Potassium chloride IV 53meq 4 hours - Recheck BMP and replace as needed    Urinary tract infection: UA appeared contaminated but also positive for yeast. Was given 1 dose of Rocephin in the ED - Repeat urinalysis with in and out cath - Diflucan IV 200 mg 1 dose now - May warrant continuing antibiotics    Leukocytosis mild elevation at 11.6 - Check lactic acid and repeat CBC in a.m.      Code Status:   full Family Communication: bedside Disposition Plan: admit   Total time  spent 55 minutes.Greater than 50% of this time was spent in counseling, explanation of diagnosis,  planning of further management, and coordination of care  [facility] Pager [phone]  If 7PM-7AM, please contact night-coverage [email or web address] Password [email or web address] 03/31/2015, 3:06 AM

## 2015-03-31 NOTE — ED Notes (Signed)
RN unable to take report at this time 

## 2015-03-31 NOTE — ED Notes (Signed)
Pt drank her coffee, declined to eat breakfast.

## 2015-03-31 NOTE — ED Notes (Signed)
Called in heart healthy breakfast tray.

## 2015-04-01 ENCOUNTER — Other Ambulatory Visit: Payer: Self-pay

## 2015-04-01 DIAGNOSIS — R1030 Lower abdominal pain, unspecified: Secondary | ICD-10-CM

## 2015-04-01 DIAGNOSIS — L8990 Pressure ulcer of unspecified site, unspecified stage: Secondary | ICD-10-CM | POA: Insufficient documentation

## 2015-04-01 LAB — BASIC METABOLIC PANEL
ANION GAP: 12 (ref 5–15)
BUN: 25 mg/dL — ABNORMAL HIGH (ref 6–20)
CALCIUM: 8.4 mg/dL — AB (ref 8.9–10.3)
CO2: 13 mmol/L — ABNORMAL LOW (ref 22–32)
CREATININE: 1.34 mg/dL — AB (ref 0.44–1.00)
Chloride: 115 mmol/L — ABNORMAL HIGH (ref 101–111)
GFR, EST AFRICAN AMERICAN: 46 mL/min — AB (ref >60)
GFR, EST NON AFRICAN AMERICAN: 40 mL/min — AB (ref >60)
Glucose, Bld: 201 mg/dL — ABNORMAL HIGH (ref 65–99)
Potassium: 3.2 mmol/L — ABNORMAL LOW (ref 3.5–5.1)
SODIUM: 140 mmol/L (ref 135–145)

## 2015-04-01 LAB — URINE CULTURE: Culture: 80000

## 2015-04-01 LAB — GLUCOSE, CAPILLARY
GLUCOSE-CAPILLARY: 168 mg/dL — AB (ref 65–99)
GLUCOSE-CAPILLARY: 71 mg/dL (ref 65–99)
Glucose-Capillary: 213 mg/dL — ABNORMAL HIGH (ref 65–99)

## 2015-04-01 LAB — CBC
HCT: 31.1 % — ABNORMAL LOW (ref 36.0–46.0)
HEMOGLOBIN: 10.4 g/dL — AB (ref 12.0–15.0)
MCH: 32.2 pg (ref 26.0–34.0)
MCHC: 33.4 g/dL (ref 30.0–36.0)
MCV: 96.3 fL (ref 78.0–100.0)
PLATELETS: 144 10*3/uL — AB (ref 150–400)
RBC: 3.23 MIL/uL — AB (ref 3.87–5.11)
RDW: 17.4 % — ABNORMAL HIGH (ref 11.5–15.5)
WBC: 4.3 10*3/uL (ref 4.0–10.5)

## 2015-04-01 MED ORDER — SODIUM CHLORIDE 0.9 % IV SOLN
1000.0000 mL | INTRAVENOUS | Status: DC
  Administered 2015-04-01 – 2015-04-04 (×7): 1000 mL via INTRAVENOUS

## 2015-04-01 MED ORDER — PANCRELIPASE (LIP-PROT-AMYL) 12000-38000 UNITS PO CPEP
12000.0000 [IU] | ORAL_CAPSULE | Freq: Three times a day (TID) | ORAL | Status: DC
  Administered 2015-04-02 – 2015-04-06 (×12): 12000 [IU] via ORAL
  Filled 2015-04-01 (×12): qty 1

## 2015-04-01 MED ORDER — DICYCLOMINE HCL 20 MG PO TABS
20.0000 mg | ORAL_TABLET | Freq: Three times a day (TID) | ORAL | Status: DC
  Administered 2015-04-01 – 2015-04-06 (×17): 20 mg via ORAL
  Filled 2015-04-01 (×18): qty 1

## 2015-04-01 MED ORDER — POTASSIUM CHLORIDE CRYS ER 20 MEQ PO TBCR
40.0000 meq | EXTENDED_RELEASE_TABLET | Freq: Once | ORAL | Status: AC
  Administered 2015-04-01: 40 meq via ORAL
  Filled 2015-04-01: qty 2

## 2015-04-01 NOTE — Progress Notes (Signed)
Initial Nutrition Assessment  DOCUMENTATION CODES:   Severe malnutrition in context of chronic illness, Underweight  INTERVENTION:  Provide Ensure Enlive po BID, each supplement provides 350 kcal and 20 grams of protein.  Monitor magnesium, potassium, and phosphorus daily for at least 3 days, MD to replete as needed, as pt is at risk for refeeding syndrome given severe malnutrition and extremely poor po intake for 1 week.  Encourage adequate PO intake.   Recommend obtaining new weight to fully assess weight trends.   RD to continue to monitor.   NUTRITION DIAGNOSIS:   Malnutrition related to chronic illness as evidenced by severe depletion of body fat, severe depletion of muscle mass.  GOAL:   Patient will meet greater than or equal to 90% of their needs  MONITOR:   PO intake, Supplement acceptance, Weight trends, Labs, I & O's, Skin  REASON FOR ASSESSMENT:   Malnutrition Screening Tool    ASSESSMENT:   70 year old female with a past medical history significant for chronic pancreatitis, hypertension, chronic pain, migraines, opioid dependence, history of breast cancer s/p lumpectomy, radiation, and chemotherapy; who presents with complaints of abdominal pain with nausea and vomiting over the last 3 weeks.  Family at bedside reports pt has been having very poor po intake over the past 1 week. She reports pt has only been able to consume at most 1 and a half eggs. She reports providing Boost to the pt at home however she has been refusing. She additionally adds, pt has been difficult to get the patient to drink water as she would rather have coffee. Pt is at risk for refeeding syndrome. Current meal completion has been 50-60%. Pt currently has Ensure ordered and has been consuming them. RD to continue with current orders. Noted no recent weight recorded. Recommend obtaining new weight. Pt was encouraged to eat her food at meals and to drink her supplements.   Nutrition-Focused  physical exam completed. Findings are severe fat depletion, severe muscle depletion, and no edema.   Labs: Low potassium, CO2, calcium, GFR. High chloride, BUN, and creatinine.   Diet Order:  Diet Heart Room service appropriate?: Yes; Fluid consistency:: Thin  Skin:  Wound (see comment) (Stage II pressure ulcer on back)  Last BM:  1/19  Height:   Ht Readings from Last 1 Encounters:  10/19/14 5\' 2"  (1.575 m)    Weight:   Wt Readings from Last 1 Encounters:  02/23/15 92 lb 12.8 oz (42.094 kg)    Ideal Body Weight:  50 kg  BMI:  There is no weight on file to calculate BMI.  Estimated Nutritional Needs:   Kcal:  1400-1600  Protein:  60-75 grams  Fluid:  >/= 1.5 L/day  EDUCATION NEEDS:   No education needs identified at this time  Corrin Parker, MS, RD, LDN Pager # [phone] After hours/ weekend pager # [phone]

## 2015-04-01 NOTE — Care Management Note (Signed)
Case Management Note  Patient Details  Name: Lindsey Gomez MRN: [id number] Date of Birth: [birth year]  Subjective/Objective:          CM following for progression and d/c planning.          Action/Plan: 04/01/2015 Noted that pt has home health aide support and the support of husband and daughter. Noted daughters concern that pt and husband have decreased home health aide services. Will follow for PT/OT recommendations and assist as needed. Will arrange [facility] services per pt and husband wishes.   Expected Discharge Date:                  Expected Discharge Plan:  [place]  In-House Referral:  NA  Discharge planning Services  CM Consult  Post Acute Care Choice:  Home Health Choice offered to:  Patient  DME Arranged:    DME Agency:     HH Arranged:  RN, PT, OT, Nurse's Aide, Social Work [employer] Agency:     Status of Service:  In process, will continue to follow  Medicare Important Message Given:    Date Medicare IM Given:    Medicare IM give by:    Date Additional Medicare IM Given:    Additional Medicare Important Message give by:     If discussed at [place] of Stay Meetings, dates discussed:    Additional Comments:  Adron Bene, RN 04/01/2015, 1:50 PM

## 2015-04-01 NOTE — Progress Notes (Addendum)
[facility] PROGRESS NOTE  Lindsey Gomez [id number] DOB: [birth year] DOA: 03/30/2015 PCP: Mathews Argyle, MD  Brief narrative 70 year old female with history of chronic pancreatitis, hypertension, chronic pain with opioid dependence, migraine headaches, history of breast cancer status post lumpectomy with radiation and chemotherapy presented to the ED with abdominal pain with nausea and vomiting for over 3 weeks duration. She was seen by gastroenterologist in office and sent to the ED. Patient complained of weight loss but was unable to specify. She reported constant abdominal pain and being unable to keep her food or liquid down. Reported pain to be 10/10 in severity. No aggravating or relieving factors. Workup done showed lipase of 49, acute kidney injury with creatinine of 2.67 and BUN of 40 and over 7.9. UA negative for UTI. CT of the abdomen and pelvis done showed bronchiectasis with nodular opacity at the lung bases possible for multifocal infection. Admitted to hospital service for further management.   Assessment/Plan: Abdominal pain with nausea and vomiting Possibly acute on chronic. CT of the abdomen showed distention of the CBD 1.7 cm. Has history of cholecystectomy. She localizes her abdominal pain to the lower quadrant and suprapubic area today. Increase could be associated with opiate withdrawal. Will avoid narcotics at this time. Continue IV hydration, Protonix. I would place her back on pancreatic enzymes. She was seen by Dr. Deatra Ina with [facility] 6 months back for ongoing abdominal pain. Was given a trial of pancreatic enzyme. I do not see it in her medication list. Given her pancreatitis I would place her on it. Continue dicyclomine when necessary.  Acute kidney injury Secondary to dehydration. Monitor with IV hydration.  Anion gap metabolic acidosis Secondary to dehydration. Still has low bicarbonate. Gap Close today. Will place on sodium bicarbonate  supplement.  Hypokalemia Replenish.  Type 2 diabetes mellitus Resume home dose Lantus and sliding scale coverage. Check A1c.   Protein calorie malnutrition Dietitian consulted. Added supplement  Weakness PT evaluation  Diarrhea Seems to have resolved. Stool for C. difficile negative.  Hypokalemia Replenish  ?UTI Contaminated UA but positive for yeast. On empiric Rocephin. Given one dose of Diflucan.  Dementia, mild Continue Namenda.  Chronic migraine Continue home medications.  History of depression Continue home medications   GERD Continue PPI  Chronic neuropathy Continue Lyrica and  Essential hypertension Resume home medications.  Anemia Noted for drop in her H&H. No history of GI bleed. Repeat in a.m.  Diet: Heart healthy DVT prophylaxis: Subcutaneous heparin   Code Status: Full code Family Communication: None at bedside Disposition Plan: Continue inpatient monitoring   Consultants:  None  Procedures:  CT abdomen and pelvis  Antibiotics:  Rocephin   HPI/Subjective: Seen and examined. Denies any nausea vomiting. Complains of lower abdominal pain.  Objective: Filed Vitals:   03/31/15 2113 04/01/15 0620  BP: 123/74 120/73  Pulse: 92 87  Temp: 98.2 F (36.8 C) 97.3 F (36.3 C)  Resp: 18 17    Intake/Output Summary (Last 24 hours) at 04/01/15 1620 Last data filed at 04/01/15 1504  Gross per 24 hour  Intake 1888.75 ml  Output    427 ml  Net 1461.75 ml   There were no vitals filed for this visit.  Exam:   General: Elderly thin built female not in distress  HEENT: No pallor, moist mucosa, supple neck  Chest: Clear bilaterally  CVS: Normal S1 and S2, no murmurs rub or gallop  GI: Soft, mild bilateral lower quadrant and suprapubic tenderness, bowel sounds present,  nondistended  Musculoskeletal: Warm, no edema  CNS: Alert and oriented  Data Reviewed: Basic Metabolic Panel:  Recent Labs Lab 03/30/15 1644  03/31/15 0328 03/31/15 2010 04/01/15 0700  NA 140 143 143 140  K 3.6 3.1* 4.6 3.2*  CL 111 114* 120* 115*  CO2 14* 12* 9* 13*  GLUCOSE 62* 139* 138* 201*  BUN 45* 40* 29* 25*  CREATININE 2.67* 2.21* 1.78* 1.34*  CALCIUM 10.0 9.0 9.0 8.4*  MG  --   --  2.1  --    Liver Function Tests:  Recent Labs Lab 03/30/15 1644  AST 20  ALT 19  ALKPHOS 83  BILITOT 0.4  PROT 7.1  ALBUMIN 2.7*    Recent Labs Lab 03/30/15 1644  LIPASE 49   No results for input(s): AMMONIA in the last 168 hours. CBC:  Recent Labs Lab 03/30/15 1644 03/31/15 0328 04/01/15 0700  WBC 11.9* 12.6* 4.3  HGB 14.2 12.8 10.4*  HCT 43.4 39.3 31.1*  MCV 96.2 96.3 96.3  PLT 349 278 144*   Cardiac Enzymes: No results for input(s): CKTOTAL, CKMB, CKMBINDEX, TROPONINI in the last 168 hours. BNP (last 3 results) No results for input(s): BNP in the last 8760 hours.  ProBNP (last 3 results) No results for input(s): PROBNP in the last 8760 hours.  CBG:  Recent Labs Lab 03/31/15 1214 03/31/15 1729 03/31/15 2037 04/01/15 0732 04/01/15 1212  GLUCAP 153* 108* 150* 168* 71    Recent Results (from the past 240 hour(s))  Urine culture     Status: None   Collection Time: 03/30/15  8:09 PM  Result Value Ref Range Status   Specimen Description URINE, CLEAN CATCH  Final   Special Requests NONE  Final   Culture 80,000 COLONIES/ml YEAST  Final   Report Status 04/01/2015 FINAL  Final  C difficile quick scan w PCR reflex     Status: Abnormal   Collection Time: 03/31/15  4:23 PM  Result Value Ref Range Status   C Diff antigen POSITIVE (A) NEGATIVE Final   C Diff toxin NEGATIVE NEGATIVE Final   C Diff interpretation   Final    C. difficile present, but toxin not detected. This indicates colonization. In most cases, this does not require treatment. If patient has signs and symptoms consistent with colitis, consider treatment. Requires ENTERIC precautions.     Studies: Ct Abdomen Pelvis Wo  Contrast  03/31/2015  CLINICAL DATA:  Acute onset of generalized abdominal pain, nausea, vomiting and diarrhea. Loss of weight. Initial encounter. EXAM: CT ABDOMEN AND PELVIS WITHOUT CONTRAST TECHNIQUE: Multidetector CT imaging of the abdomen and pelvis was performed following the standard protocol without IV contrast. COMPARISON:  CT of the abdomen and pelvis performed 12/31/2013, and renal ultrasound performed 04/10/2014 FINDINGS: Nodular opacity at the lung bases may reflect multifocal infection, as it is largely new from 2015. Underlying bronchiectasis is noted. Scattered coronary artery calcifications are seen. The liver and spleen are unremarkable in appearance. The patient is status post cholecystectomy, with clips noted along the gallbladder fossa. There is distention of the common bile duct to 1.7 cm, with underlying intrahepatic biliary ductal prominence. Scattered calcification is noted at the pancreatic head, reflecting sequelae of chronic pancreatitis. The adrenal glands are grossly unremarkable in appearance. Scattered calcifications at the renal hila are thought to be vascular in nature. An apparent 1.2 cm cyst is suggested at the interpole region of the left kidney. Mild nonspecific perinephric stranding is noted bilaterally. There is no evidence of hydronephrosis.  No obstructing ureteral stones are identified. No free fluid is identified. The small bowel is unremarkable in appearance. The stomach is filled with contrast and is within normal limits. Surrounding postoperative change is noted. No acute vascular abnormalities are seen. Diffuse calcification is noted along the abdominal aorta and its branches. The patient is status post appendectomy. The colon is largely filled with fluid and air, and is grossly unremarkable in appearance. The bladder is moderately distended and grossly unremarkable. The patient is status post hysterectomy. No suspicious adnexal masses are seen. No inguinal  lymphadenopathy is seen. No acute osseous abnormalities are identified. IMPRESSION: 1. Nodular opacity at the lung bases may reflect multifocal infection, as it is new from 2015. Underlying bronchiectasis noted. 2. Scattered calcification at the pancreatic head, reflecting sequelae of chronic pancreatitis. 3. Left renal cyst noted. 4. Scattered coronary artery calcifications seen. 5. Diffuse calcification along the abdominal aorta and its branches. 6. Colon largely filled with fluid and air, and grossly unremarkable in appearance. Electronically Signed   By: Garald Balding M.D.   On: 03/31/2015 01:59   US Renal  03/31/2015  CLINICAL DATA:  Acute renal failure. EXAM: RENAL / URINARY TRACT ULTRASOUND COMPLETE COMPARISON:  04/10/2014 and abdomen CT dated 03/31/2015. FINDINGS: Right Kidney: Length: 10.7 cm. Mildly diffusely echogenic. 5 mm mid to lower renal calculus. Diffuse cortical thinning and prominent renal sinus fat. No hydronephrosis. Left Kidney: Length: 11.6 cm. Mildly diffusely echogenic. 1.9 cm upper pole cyst. 7 mm lower pole calculus. No hydronephrosis. Bladder: Appears normal for degree of bladder distention. The patient was unable to urinate. The calculated bladder volume is 589 cc. IMPRESSION: 1. Mildly diffusely increased echogenicity of both kidneys, compatible with medical renal disease. 2. No hydronephrosis. 3. Small bilateral renal calculi. 4. Inability to urinate with a calculated bladder volume of 589 cc. Electronically Signed   By: Claudie Revering M.D.   On: 03/31/2015 17:21   Dg Chest Port 1 View  03/31/2015  CLINICAL DATA:  Productive cough EXAM: PORTABLE CHEST 1 VIEW COMPARISON:  08/04/2014 FINDINGS: Cardiomediastinal silhouette is stable. Bilateral emphysematous changes are again noted. Old fractures of the right sixth and seventh rib. There is new nodular consolidation right lower lobe perihilar region measures 3.5 cm. Mass or adenopathy cannot be excluded. Further correlation with CT  scan of the chest preferably with IV contrast is recommended. Patchy right base medially atelectasis or infiltrate. IMPRESSION: There is new nodular consolidation right lower lobe perihilar region measures 3.5 cm. Mass or adenopathy cannot be excluded. Further correlation with CT scan of the chest preferably with IV contrast is recommended. Patchy right base medially atelectasis or infiltrate. Electronically Signed   By: Lahoma Crocker M.D.   On: 03/31/2015 16:59    Scheduled Meds: . atenolol  50 mg Oral Daily  . atorvastatin  10 mg Oral Daily  . cefTRIAXone (ROCEPHIN)  IV  1 g Intravenous Q24H  . escitalopram  20 mg Oral Daily  . feeding supplement (ENSURE ENLIVE)  237 mL Oral BID BM  . ferrous sulfate  325 mg Oral Q breakfast  . fluticasone  1 spray Each Nare Daily  . gabapentin  100 mg Oral BID  . heparin  5,000 Units Subcutaneous 3 times per day  . hydrocortisone  10 mg Oral BID  . insulin aspart  0-9 Units Subcutaneous TID WC  . memantine  7 mg Oral BID  . pantoprazole  40 mg Oral Daily  . pregabalin  50 mg Oral BID  .  sertraline  25 mg Oral Daily   Continuous Infusions: . sodium chloride 1,000 mL (03/31/15 0435)  .  sodium bicarbonate  infusion 1000 mL 75 mL/hr at 04/01/15 1533      Time spent: 25 minutes    Adelayde Minney, [place]  [facility] Pager [phone]. If 7PM-7AM, please contact night-coverage at [email or web address], password [facility] 04/01/2015, 4:20 PM  LOS: 1 day

## 2015-04-01 NOTE — Progress Notes (Signed)
Utilization review completed. Corneshia Hines, RN, BSN. 

## 2015-04-01 NOTE — Progress Notes (Signed)
Spoke with pt's daughter Ivin Booty) and care taker [name]). Per both parties pt refuses to take daily medications while at home. Per [name], pt doesn't eat as she should. Ivin Booty request that d/t pt's narcotic abuse history that she is not given pain medication. Daughter states that pt and her husband had 24 hour care givers but elected to decrease the time to 3-4 times a week. Daughter concerned that pt needs more help at home but will not accept it. Daughter states pt's husband is very limited in the care that he is able to assist the pt with.

## 2015-04-01 NOTE — Progress Notes (Signed)
Physical Therapy Treatment Patient Details Name: Lindsey Gomez MRN: [id number] DOB: 01-25-1946 Today's Date: 04/01/2015    History of Present Illness Patient is a 70 yo female admitted 03/30/15 with abdominal pain, nausea, vomiting, AKI.   PMH: chronic pancreatitis, HTN, chronic pain, opioid dependence, depression, anxiety, COPD, DM, Parkinson's    PT Comments    Very modest progress today. Still fatigues easily and requires min assist for balance. Demonstrates loss of balance to posterior on several occasions requiring assist for balance. Pt will require supervision for all out of bed mobility at home which apparently is available. To practice stairs prior to d/c, declined today.  Follow Up Recommendations  Home health PT;Supervision for mobility/OOB (Husband will need to provide assistance for all OOB mobility)     Equipment Recommendations  3in1 (PT)    Recommendations for Other Services       Precautions / Restrictions Precautions Precautions: Fall Restrictions Weight Bearing Restrictions: No    Mobility  Bed Mobility Overal bed mobility: Needs Assistance Bed Mobility: Supine to Sit;Sit to Supine     Supine to sit: Min guard Sit to supine: Min guard   General bed mobility comments: Min guard for safety. VC for technique. Requires extra time and use of rail.  Transfers Overall transfer level: Needs assistance Equipment used: Rolling walker (2 wheeled) Transfers: Sit to/from Stand Sit to Stand: Min guard         General transfer comment: Close guard for safety. Stable once holding RW for support upon standing. VC for hand placement to rise.  Ambulation/Gait Ambulation/Gait assistance: Min assist Ambulation Distance (Feet): 50 Feet Assistive device: Rolling walker (2 wheeled) Gait Pattern/deviations: Step-through pattern;Decreased stride length;Leaning posteriorly;Trunk flexed Gait velocity: decreased Gait velocity interpretation: Below normal speed for  age/gender General Gait Details: Demonstrates several instances of loss of balance towards posterior. Requires physical assist to correct. VC to lean anteriorly with weight through UEs for support. Some difficulty with turns.    Stairs            Wheelchair Mobility    Modified Rankin (Stroke Patients Only)       Balance                                    Cognition Arousal/Alertness: Awake/alert Behavior During Therapy: Flat affect Overall Cognitive Status: Within Functional Limits for tasks assessed                      Exercises      General Comments General comments (skin integrity, edema, etc.): States she was not using a device PTA but even with device pt is losing balance today.      Pertinent Vitals/Pain Pain Assessment: 0-10 Pain Score: 5  Pain Location: back Pain Descriptors / Indicators: Aching Pain Intervention(s): Monitored during session;Repositioned    Home Living                      Prior Function            PT Goals (current goals can now be found in the care plan section) Acute Rehab PT Goals Patient Stated Goal: To decrease pain PT Goal Formulation: With patient Time For Goal Achievement: 04/07/15 Potential to Achieve Goals: Good Progress towards PT goals: Progressing toward goals    Frequency  Min 3X/week    PT Plan Current plan remains appropriate  Co-evaluation             End of Session Equipment Utilized During Treatment: Gait belt Activity Tolerance: Patient limited by fatigue Patient left: with call bell/phone within reach;in bed;with bed alarm set;with SCD's reapplied     Time: 1030-1044 PT Time Calculation (min) (ACUTE ONLY): 14 min  Charges:  $Gait Training: 8-22 mins                    G Codes:      Ellouise Newer 2015-04-26, 11:40 AM  Elayne Snare, [place]

## 2015-04-02 DIAGNOSIS — R112 Nausea with vomiting, unspecified: Secondary | ICD-10-CM

## 2015-04-02 DIAGNOSIS — R197 Diarrhea, unspecified: Secondary | ICD-10-CM

## 2015-04-02 DIAGNOSIS — K861 Other chronic pancreatitis: Secondary | ICD-10-CM

## 2015-04-02 LAB — BASIC METABOLIC PANEL
Anion gap: 4 — ABNORMAL LOW (ref 5–15)
BUN: 15 mg/dL (ref 6–20)
CO2: 20 mmol/L — AB (ref 22–32)
Calcium: 8.1 mg/dL — ABNORMAL LOW (ref 8.9–10.3)
Chloride: 117 mmol/L — ABNORMAL HIGH (ref 101–111)
Creatinine, Ser: 0.96 mg/dL (ref 0.44–1.00)
GFR calc non Af Amer: 60 mL/min — ABNORMAL LOW (ref >60)
GLUCOSE: 145 mg/dL — AB (ref 65–99)
POTASSIUM: 3.5 mmol/L (ref 3.5–5.1)
Sodium: 141 mmol/L (ref 135–145)

## 2015-04-02 LAB — CBC
HEMATOCRIT: 27.8 % — AB (ref 36.0–46.0)
HEMOGLOBIN: 9.4 g/dL — AB (ref 12.0–15.0)
MCH: 31.8 pg (ref 26.0–34.0)
MCHC: 33.8 g/dL (ref 30.0–36.0)
MCV: 93.9 fL (ref 78.0–100.0)
Platelets: 153 10*3/uL (ref 150–400)
RBC: 2.96 MIL/uL — AB (ref 3.87–5.11)
RDW: 17.3 % — ABNORMAL HIGH (ref 11.5–15.5)
WBC: 3.4 10*3/uL — ABNORMAL LOW (ref 4.0–10.5)

## 2015-04-02 LAB — GLUCOSE, CAPILLARY
GLUCOSE-CAPILLARY: 153 mg/dL — AB (ref 65–99)
GLUCOSE-CAPILLARY: 135 mg/dL — AB (ref 65–99)
GLUCOSE-CAPILLARY: 152 mg/dL — AB (ref 65–99)
GLUCOSE-CAPILLARY: 226 mg/dL — AB (ref 65–99)
Glucose-Capillary: 109 mg/dL — ABNORMAL HIGH (ref 65–99)

## 2015-04-02 LAB — HEMOGLOBIN A1C
HEMOGLOBIN A1C: 6 % — AB (ref 4.8–5.6)
Mean Plasma Glucose: 126 mg/dL

## 2015-04-02 NOTE — Progress Notes (Signed)
CSW order placed to "evaluate patient's needs".  Unit RNCM has ascertained that patient will return home with home health services at d/c per patient and husband's wishes.  Patient already has a home care aide at home per [facility] note.  CSW intervention at this point does not appear to be indicated; thus, will sign off for now but available in the future if needed.  Lorie Phenix. Pauline Good, [place]

## 2015-04-02 NOTE — Progress Notes (Signed)
Physical Therapy Treatment Patient Details Name: Lindsey Gomez MRN: [id number] DOB: 1945/06/07 Today's Date: 04/02/2015    History of Present Illness Patient is a 70 yo female admitted 03/30/15 with abdominal pain, nausea, vomiting, AKI.   PMH: chronic pancreatitis, HTN, chronic pain, opioid dependence, depression, anxiety, COPD, DM, Parkinson's    PT Comments    Patient continues to progress.  Less balance issues today, although still present.  Patient will continue to need assistance at home and feel HHPT is appropriate to progress mobility and address balance issues.  Will continue PT while in hospital.     Follow Up Recommendations  Home health PT;Supervision for mobility/OOB     Equipment Recommendations  3in1 (PT)    Recommendations for Other Services       Precautions / Restrictions Precautions Precautions: Fall    Mobility  Bed Mobility Overal bed mobility: Modified Independent Bed Mobility: Supine to Sit;Sit to Supine     Supine to sit: Modified independent (Device/Increase time) Sit to supine: Modified independent (Device/Increase time)   General bed mobility comments: used railing to lift shoulders off bed and to return to supine.  Transfers Overall transfer level: Needs assistance Equipment used: Rolling walker (2 wheeled) Transfers: Sit to/from Stand Sit to Stand: Min assist         General transfer comment: patient with loss of balance once reached standing and required assistance to regain.    Ambulation/Gait Ambulation/Gait assistance: Min guard Ambulation Distance (Feet): 150 Feet Assistive device: Rolling walker (2 wheeled) Gait Pattern/deviations: Step-through pattern;Leaning posteriorly     General Gait Details: Patient did better today with balance; required close guarding for balance.  Did lose balance 1 x posteriorly but able to regain.  Required cueing to not get "ahead of walker" and to ambulate at safe pace.   Stairs Stairs:   (verbalized up and down stairs)          Wheelchair Mobility    Modified Rankin (Stroke Patients Only)       Balance Overall balance assessment: Needs assistance Sitting-balance support: No upper extremity supported Sitting balance-Leahy Scale: Good     Standing balance support: Bilateral upper extremity supported Standing balance-Leahy Scale: Poor Standing balance comment: required assistance when first reached standing                    Cognition Arousal/Alertness: Awake/alert Behavior During Therapy: WFL for tasks assessed/performed Overall Cognitive Status: Within Functional Limits for tasks assessed                      Exercises General Exercises - Lower Extremity Ankle Circles/Pumps: AROM;Both;10 reps;Seated Gluteal Sets: AROM;Both;10 reps;Supine Long Arc Quad: AROM;Both;10 reps;Seated Hip Flexion/Marching: AROM;Both;10 reps;Seated    General Comments        Pertinent Vitals/Pain Pain Assessment: No/denies pain    Home Living                      Prior Function            PT Goals (current goals can now be found in the care plan section) Progress towards PT goals: Progressing toward goals    Frequency  Min 3X/week    PT Plan Current plan remains appropriate    Co-evaluation             End of Session Equipment Utilized During Treatment: Gait belt Activity Tolerance: Patient tolerated treatment well;No increased pain Patient left: in bed;with call bell/phone  within reach;with nursing/sitter in room     Time: 1110-1124 PT Time Calculation (min) (ACUTE ONLY): 14 min  Charges:  $Gait Training: 8-22 mins                    G Codes:      Shanna Cisco 04-29-15, 11:35 AM 04-29-15 [place], [place]

## 2015-04-02 NOTE — Progress Notes (Signed)
[facility] PROGRESS NOTE  Lindsey Gomez [id number] DOB: [birth year] DOA: 03/30/2015 PCP: Mathews Argyle, MD  Brief narrative 70 year old female with history of chronic pancreatitis, hypertension, chronic pain with opioid dependence, migraine headaches, history of breast cancer status post lumpectomy with radiation and chemotherapy presented to the ED with abdominal pain with nausea and vomiting for over 3 weeks duration. She was seen by gastroenterologist in office and sent to the ED. Patient complained of weight loss but was unable to specify. She reported constant abdominal pain and being unable to keep her food or liquid down. Reported pain to be 10/10 in severity. No aggravating or relieving factors. Workup done showed lipase of 49, acute kidney injury with creatinine of 2.67 and BUN of 40 and over 7.9. UA negative for UTI. CT of the abdomen and pelvis done showed bronchiectasis with nodular opacity at the lung bases possible for multifocal infection. Admitted to hospital service for further management.   Assessment/Plan: Abdominal pain with nausea and vomiting Possibly acute on chronic. CT of the abdomen showed distention of the CBD 1.7 cm. Has history of cholecystectomy.   Will avoid narcotics at this time. Continue IV hydration, Protonix. I would place her back on pancreatic enzymes. She was seen by Dr. Deatra Ina with [facility] 6 months back for ongoing abdominal pain. Was given a trial of pancreatic enzyme. I do not see it in her medication list. Pt is back on creon. Continue dicyclomine when necessary.   Acute kidney injury Secondary to dehydration. Monitor with IV hydration.  Anion gap metabolic acidosis Secondary to dehydration. Still has low bicarbonate. Gap Close today. Will place on sodium bicarbonate supplement.  Hypokalemia Replenish.  Type 2 diabetes mellitus Resume home dose Lantus and sliding scale coverage. Check A1c.   Protein calorie  malnutrition Dietitian consulted. Added supplement  Weakness PT evaluation  Diarrhea Seems to have resolved. Stool for C. difficile negative.  Hypokalemia Replenish  ?UTI Contaminated UA but positive for yeast. On empiric Rocephin. Given one dose of Diflucan. Pt has no dysuria complaints currently. Has had 3 days of treatment with Rocephin. Will discontinue  Dementia, mild Continue Namenda.  Chronic migraine Continue home medications.  History of depression Continue home medications   GERD Continue PPI  Chronic neuropathy Continue Lyrica and  Essential hypertension Resume home medications.  Anemia Noted for drop in her H&H. No history of GI bleed. Repeat in a.m.  Diet: Heart healthy DVT prophylaxis: Subcutaneous heparin   Code Status: Full code Family Communication: None at bedside Disposition Plan: Continue inpatient monitoring   Consultants:  None  Procedures:  CT abdomen and pelvis  Antibiotics:  Rocephin   HPI/Subjective: Pt has no new complaints. No acute issues overnight.  Objective: Filed Vitals:   04/02/15 0558 04/02/15 1100  BP: 111/69   Pulse: 74 74  Temp: 98 F (36.7 C) 97.5 F (36.4 C)  Resp: 19 20    Intake/Output Summary (Last 24 hours) at 04/02/15 1441 Last data filed at 04/02/15 1100  Gross per 24 hour  Intake 1928.33 ml  Output    102 ml  Net 1826.33 ml   There were no vitals filed for this visit.  Exam:   General: Awake and alert  HEENT: No pallor, moist mucosa, supple neck  Chest: Clear bilaterally, no wheezes  CVS: Normal S1 and S2, no murmurs rub or gallop  GI: Soft, mild bilateral lower quadrant and suprapubic tenderness, bowel sounds present, nondistended  Musculoskeletal: Warm, no edema  CNS: Alert and  oriented  Data Reviewed: Basic Metabolic Panel:  Recent Labs Lab 03/30/15 1644 03/31/15 0328 03/31/15 2010 04/01/15 0700 04/02/15 0415  NA 140 143 143 140 141  K 3.6 3.1* 4.6 3.2* 3.5   CL 111 114* 120* 115* 117*  CO2 14* 12* 9* 13* 20*  GLUCOSE 62* 139* 138* 201* 145*  BUN 45* 40* 29* 25* 15  CREATININE 2.67* 2.21* 1.78* 1.34* 0.96  CALCIUM 10.0 9.0 9.0 8.4* 8.1*  MG  --   --  2.1  --   --    Liver Function Tests:  Recent Labs Lab 03/30/15 1644  AST 20  ALT 19  ALKPHOS 83  BILITOT 0.4  PROT 7.1  ALBUMIN 2.7*    Recent Labs Lab 03/30/15 1644  LIPASE 49   No results for input(s): AMMONIA in the last 168 hours. CBC:  Recent Labs Lab 03/30/15 1644 03/31/15 0328 04/01/15 0700 04/02/15 0415  WBC 11.9* 12.6* 4.3 3.4*  HGB 14.2 12.8 10.4* 9.4*  HCT 43.4 39.3 31.1* 27.8*  MCV 96.2 96.3 96.3 93.9  PLT 349 278 144* 153   Cardiac Enzymes: No results for input(s): CKTOTAL, CKMB, CKMBINDEX, TROPONINI in the last 168 hours. BNP (last 3 results) No results for input(s): BNP in the last 8760 hours.  ProBNP (last 3 results) No results for input(s): PROBNP in the last 8760 hours.  CBG:  Recent Labs Lab 04/01/15 1212 04/01/15 1625 04/01/15 2201 04/02/15 0746 04/02/15 1221  GLUCAP 71 213* 109* 153* 135*    Recent Results (from the past 240 hour(s))  Urine culture     Status: None   Collection Time: 03/30/15  8:09 PM  Result Value Ref Range Status   Specimen Description URINE, CLEAN CATCH  Final   Special Requests NONE  Final   Culture 80,000 COLONIES/ml YEAST  Final   Report Status 04/01/2015 FINAL  Final  C difficile quick scan w PCR reflex     Status: Abnormal   Collection Time: 03/31/15  4:23 PM  Result Value Ref Range Status   C Diff antigen POSITIVE (A) NEGATIVE Final   C Diff toxin NEGATIVE NEGATIVE Final   C Diff interpretation   Final    C. difficile present, but toxin not detected. This indicates colonization. In most cases, this does not require treatment. If patient has signs and symptoms consistent with colitis, consider treatment. Requires ENTERIC precautions.     Studies: US Renal  03/31/2015  CLINICAL DATA:  Acute renal  failure. EXAM: RENAL / URINARY TRACT ULTRASOUND COMPLETE COMPARISON:  04/10/2014 and abdomen CT dated 03/31/2015. FINDINGS: Right Kidney: Length: 10.7 cm. Mildly diffusely echogenic. 5 mm mid to lower renal calculus. Diffuse cortical thinning and prominent renal sinus fat. No hydronephrosis. Left Kidney: Length: 11.6 cm. Mildly diffusely echogenic. 1.9 cm upper pole cyst. 7 mm lower pole calculus. No hydronephrosis. Bladder: Appears normal for degree of bladder distention. The patient was unable to urinate. The calculated bladder volume is 589 cc. IMPRESSION: 1. Mildly diffusely increased echogenicity of both kidneys, compatible with medical renal disease. 2. No hydronephrosis. 3. Small bilateral renal calculi. 4. Inability to urinate with a calculated bladder volume of 589 cc. Electronically Signed   By: Claudie Revering M.D.   On: 03/31/2015 17:21   Dg Chest Port 1 View  03/31/2015  CLINICAL DATA:  Productive cough EXAM: PORTABLE CHEST 1 VIEW COMPARISON:  08/04/2014 FINDINGS: Cardiomediastinal silhouette is stable. Bilateral emphysematous changes are again noted. Old fractures of the right sixth and seventh rib.  There is new nodular consolidation right lower lobe perihilar region measures 3.5 cm. Mass or adenopathy cannot be excluded. Further correlation with CT scan of the chest preferably with IV contrast is recommended. Patchy right base medially atelectasis or infiltrate. IMPRESSION: There is new nodular consolidation right lower lobe perihilar region measures 3.5 cm. Mass or adenopathy cannot be excluded. Further correlation with CT scan of the chest preferably with IV contrast is recommended. Patchy right base medially atelectasis or infiltrate. Electronically Signed   By: Lahoma Crocker M.D.   On: 03/31/2015 16:59    Scheduled Meds: . atenolol  50 mg Oral Daily  . atorvastatin  10 mg Oral Daily  . cefTRIAXone (ROCEPHIN)  IV  1 g Intravenous Q24H  . dicyclomine  20 mg Oral TID AC & HS  . escitalopram  20  mg Oral Daily  . feeding supplement (ENSURE ENLIVE)  237 mL Oral BID BM  . ferrous sulfate  325 mg Oral Q breakfast  . fluticasone  1 spray Each Nare Daily  . gabapentin  100 mg Oral BID  . heparin  5,000 Units Subcutaneous 3 times per day  . hydrocortisone  10 mg Oral BID  . insulin aspart  0-9 Units Subcutaneous TID WC  . lipase/protease/amylase  12,000 Units Oral TID AC  . memantine  7 mg Oral BID  . pantoprazole  40 mg Oral Daily  . pregabalin  50 mg Oral BID  . sertraline  25 mg Oral Daily   Continuous Infusions: . sodium chloride 1,000 mL (04/02/15 0716)  .  sodium bicarbonate  infusion 1000 mL 75 mL/hr at 04/01/15 1533      Time spent: 25 minutes    Krystale Rinkenberger, [facility]  [facility] Pager [phone]. If 7PM-7AM, please contact night-coverage at [email or web address], password [facility] 04/02/2015, 2:41 PM  LOS: 2 days

## 2015-04-03 DIAGNOSIS — N179 Acute kidney failure, unspecified: Principal | ICD-10-CM

## 2015-04-03 DIAGNOSIS — D72829 Elevated white blood cell count, unspecified: Secondary | ICD-10-CM

## 2015-04-03 LAB — GLUCOSE, CAPILLARY
GLUCOSE-CAPILLARY: 182 mg/dL — AB (ref 65–99)
Glucose-Capillary: 155 mg/dL — ABNORMAL HIGH (ref 65–99)
Glucose-Capillary: 189 mg/dL — ABNORMAL HIGH (ref 65–99)
Glucose-Capillary: 175 mg/dL — ABNORMAL HIGH (ref 65–99)

## 2015-04-03 NOTE — Progress Notes (Signed)
[facility] PROGRESS NOTE  Lindsey Gomez [id number] DOB: November 21, 1945 DOA: 03/30/2015 PCP: Mathews Argyle, MD  Brief narrative 70 year old female with history of chronic pancreatitis, hypertension, chronic pain with opioid dependence, migraine headaches, history of breast cancer status post lumpectomy with radiation and chemotherapy presented to the ED with abdominal pain with nausea and vomiting for over 3 weeks duration. She was seen by gastroenterologist in office and sent to the ED. Patient complained of weight loss but was unable to specify. She reported constant abdominal pain and being unable to keep her food or liquid down. Reported pain to be 10/10 in severity. No aggravating or relieving factors. Workup done showed lipase of 49, acute kidney injury with creatinine of 2.67 and BUN of 40 and over 7.9. UA negative for UTI. CT of the abdomen and pelvis done showed bronchiectasis with nodular opacity at the lung bases possible for multifocal infection. Admitted to hospital service for further management.   Assessment/Plan: Abdominal pain with nausea and vomiting Possibly acute on chronic. CT of the abdomen showed distention of the CBD 1.7 cm. Has history of cholecystectomy.   Will avoid narcotics at this time. Continue IV hydration, continue protonix and pancreatic enzymes. She was seen by Dr. Deatra Ina with [facility] 6 months back for ongoing abdominal pain. Was given a trial of pancreatic enzyme. I do not see it in her medication list. Pt is back on creon. Continue dicyclomine when necessary.   Acute kidney injury Secondary to dehydration. Monitor with IV hydration.  Anion gap metabolic acidosis Secondary to dehydration. Still has low bicarbonate. Gap Close today. Will place on sodium bicarbonate supplement.  Hypokalemia Resolved.  Type 2 diabetes mellitus Resume home dose Lantus and sliding scale coverage. Check A1c.   Protein calorie malnutrition Dietitian consulted.  Added supplement  Weakness PT evaluation  Diarrhea Seems to have resolved. Stool for C. difficile negative.  Hypokalemia Replenish  ?UTI Pt treated for 3 days with antibiotics and  1 time dose of diflucan  Dementia, mild Continue Namenda.  Chronic migraine Continue home medications.  History of depression Continue home medications   GERD Continue PPI  Chronic neuropathy Continue Lyrica and  Essential hypertension Resume home medications.  Anemia Noted for drop in her H&H. No history of GI bleed. Repeat in a.m.  Diet: Heart healthy DVT prophylaxis: Subcutaneous heparin   Code Status: Full code Family Communication: None at bedside Disposition Plan: pending improvement in condition.   Consultants:  None  Procedures:  CT abdomen and pelvis  Antibiotics:  Rocephin   HPI/Subjective: Pt has no new complaints. No acute issues overnight.  Objective: Filed Vitals:   04/03/15 0558 04/03/15 0836  BP: 116/69 131/74  Pulse: 68 89  Temp: 98.1 F (36.7 C) 97.9 F (36.6 C)  Resp: 20 16    Intake/Output Summary (Last 24 hours) at 04/03/15 1808 Last data filed at 04/03/15 1629  Gross per 24 hour  Intake 2396.67 ml  Output   2103 ml  Net 293.67 ml   There were no vitals filed for this visit.  Exam:   General: Awake and alert  HEENT: No pallor, moist mucosa, supple neck  Chest: Clear bilaterally, no wheezes  CVS: Normal S1 and S2, no murmurs rub or gallop  GI: Soft, mild bilateral lower quadrant and suprapubic tenderness, bowel sounds present, nondistended  Musculoskeletal: Warm, no edema  CNS: Alert and oriented  Data Reviewed: Basic Metabolic Panel:  Recent Labs Lab 03/30/15 1644 03/31/15 0328 03/31/15 2010 04/01/15 0700 04/02/15  0415  NA 140 143 143 140 141  K 3.6 3.1* 4.6 3.2* 3.5  CL 111 114* 120* 115* 117*  CO2 14* 12* 9* 13* 20*  GLUCOSE 62* 139* 138* 201* 145*  BUN 45* 40* 29* 25* 15  CREATININE 2.67* 2.21* 1.78*  1.34* 0.96  CALCIUM 10.0 9.0 9.0 8.4* 8.1*  MG  --   --  2.1  --   --    Liver Function Tests:  Recent Labs Lab 03/30/15 1644  AST 20  ALT 19  ALKPHOS 83  BILITOT 0.4  PROT 7.1  ALBUMIN 2.7*    Recent Labs Lab 03/30/15 1644  LIPASE 49   No results for input(s): AMMONIA in the last 168 hours. CBC:  Recent Labs Lab 03/30/15 1644 03/31/15 0328 04/01/15 0700 04/02/15 0415  WBC 11.9* 12.6* 4.3 3.4*  HGB 14.2 12.8 10.4* 9.4*  HCT 43.4 39.3 31.1* 27.8*  MCV 96.2 96.3 96.3 93.9  PLT 349 278 144* 153   Cardiac Enzymes: No results for input(s): CKTOTAL, CKMB, CKMBINDEX, TROPONINI in the last 168 hours. BNP (last 3 results) No results for input(s): BNP in the last 8760 hours.  ProBNP (last 3 results) No results for input(s): PROBNP in the last 8760 hours.  CBG:  Recent Labs Lab 04/02/15 1656 04/02/15 2219 04/03/15 0801 04/03/15 1152 04/03/15 1730  GLUCAP 152* 226* 155* 189* 175*    Recent Results (from the past 240 hour(s))  Urine culture     Status: None   Collection Time: 03/30/15  8:09 PM  Result Value Ref Range Status   Specimen Description URINE, CLEAN CATCH  Final   Special Requests NONE  Final   Culture 80,000 COLONIES/ml YEAST  Final   Report Status 04/01/2015 FINAL  Final  C difficile quick scan w PCR reflex     Status: Abnormal   Collection Time: 03/31/15  4:23 PM  Result Value Ref Range Status   C Diff antigen POSITIVE (A) NEGATIVE Final   C Diff toxin NEGATIVE NEGATIVE Final   C Diff interpretation   Final    C. difficile present, but toxin not detected. This indicates colonization. In most cases, this does not require treatment. If patient has signs and symptoms consistent with colitis, consider treatment. Requires ENTERIC precautions.     Studies: No results found.  Scheduled Meds: . atenolol  50 mg Oral Daily  . atorvastatin  10 mg Oral Daily  . dicyclomine  20 mg Oral TID AC & HS  . escitalopram  20 mg Oral Daily  . feeding  supplement (ENSURE ENLIVE)  237 mL Oral BID BM  . ferrous sulfate  325 mg Oral Q breakfast  . fluticasone  1 spray Each Nare Daily  . gabapentin  100 mg Oral BID  . heparin  5,000 Units Subcutaneous 3 times per day  . hydrocortisone  10 mg Oral BID  . insulin aspart  0-9 Units Subcutaneous TID WC  . lipase/protease/amylase  12,000 Units Oral TID AC  . memantine  7 mg Oral BID  . pantoprazole  40 mg Oral Daily  . pregabalin  50 mg Oral BID  . sertraline  25 mg Oral Daily   Continuous Infusions: . sodium chloride 1,000 mL (04/03/15 0554)  .  sodium bicarbonate  infusion 1000 mL 75 mL/hr at 04/01/15 1533      Time spent: 25 minutes    Lindsey Gomez, [facility]  [facility] Pager [phone]. If 7PM-7AM, please contact night-coverage at [email or web address], password [facility] 04/03/2015, 6:08  PM  LOS: 3 days

## 2015-04-04 DIAGNOSIS — E876 Hypokalemia: Secondary | ICD-10-CM

## 2015-04-04 LAB — BASIC METABOLIC PANEL
Anion gap: 7 (ref 5–15)
BUN: 7 mg/dL (ref 6–20)
CALCIUM: 7.5 mg/dL — AB (ref 8.9–10.3)
CO2: 17 mmol/L — ABNORMAL LOW (ref 22–32)
CREATININE: 0.72 mg/dL (ref 0.44–1.00)
Chloride: 118 mmol/L — ABNORMAL HIGH (ref 101–111)
GFR calc Af Amer: >60 mL/min (ref >60)
GLUCOSE: 156 mg/dL — AB (ref 65–99)
POTASSIUM: 2.3 mmol/L — AB (ref 3.5–5.1)
Sodium: 142 mmol/L (ref 135–145)

## 2015-04-04 LAB — CBC
HEMATOCRIT: 26.9 % — AB (ref 36.0–46.0)
Hemoglobin: 9.3 g/dL — ABNORMAL LOW (ref 12.0–15.0)
MCH: 31.6 pg (ref 26.0–34.0)
MCHC: 34.6 g/dL (ref 30.0–36.0)
MCV: 91.5 fL (ref 78.0–100.0)
PLATELETS: 127 10*3/uL — AB (ref 150–400)
RBC: 2.94 MIL/uL — ABNORMAL LOW (ref 3.87–5.11)
RDW: 16.8 % — AB (ref 11.5–15.5)
WBC: 4.1 10*3/uL (ref 4.0–10.5)

## 2015-04-04 LAB — GLUCOSE, CAPILLARY
GLUCOSE-CAPILLARY: 135 mg/dL — AB (ref 65–99)
GLUCOSE-CAPILLARY: 194 mg/dL — AB (ref 65–99)
GLUCOSE-CAPILLARY: 183 mg/dL — AB (ref 65–99)
Glucose-Capillary: 200 mg/dL — ABNORMAL HIGH (ref 65–99)

## 2015-04-04 MED ORDER — POTASSIUM CHLORIDE 10 MEQ/100ML IV SOLN
10.0000 meq | INTRAVENOUS | Status: AC
  Administered 2015-04-04: 10 meq via INTRAVENOUS
  Filled 2015-04-04 (×2): qty 100

## 2015-04-04 MED ORDER — POTASSIUM CHLORIDE CRYS ER 20 MEQ PO TBCR
40.0000 meq | EXTENDED_RELEASE_TABLET | Freq: Once | ORAL | Status: AC
  Administered 2015-04-04: 40 meq via ORAL
  Filled 2015-04-04: qty 2

## 2015-04-04 MED ORDER — LOPERAMIDE HCL 2 MG PO CAPS
4.0000 mg | ORAL_CAPSULE | Freq: Once | ORAL | Status: AC
  Administered 2015-04-04: 4 mg via ORAL
  Filled 2015-04-04: qty 2

## 2015-04-04 MED ORDER — POTASSIUM CHLORIDE 10 MEQ/100ML IV SOLN
10.0000 meq | INTRAVENOUS | Status: AC
  Administered 2015-04-04 (×3): 10 meq via INTRAVENOUS
  Filled 2015-04-04 (×2): qty 100

## 2015-04-04 NOTE — Progress Notes (Signed)
Physical Therapy Treatment Patient Details Name: Lindsey Gomez MRN: [id number] DOB: 1946/01/03 Today's Date: 04/04/2015    History of Present Illness Patient is a 70 yo female admitted 03/30/15 with abdominal pain, nausea, vomiting, AKI.   PMH: chronic pancreatitis, HTN, chronic pain, opioid dependence, depression, anxiety, COPD, DM, Parkinson's    PT Comments    Pt making minimal/moderate progress towards acute care goals as significant time was spent toileting today as pt had uncontrolled BM while ambulating in room. Pt requiring Min A for all transfers and mobility for safety as pt is generally weak and off balance upon initially standing. Pt will need 24/7 supervision/assistance upon returning home with HHPT to maximize recovery and for safety concerns regarding risk of falls.   Follow Up Recommendations  Home health PT;Supervision/Assistance - 24 hour     Equipment Recommendations  3in1 (PT)    Recommendations for Other Services       Precautions / Restrictions Precautions Precautions: Fall Restrictions Weight Bearing Restrictions: No    Mobility  Bed Mobility Overal bed mobility: Needs Assistance Bed Mobility: Supine to Sit     Supine to sit: Min assist Sit to supine: Min assist   General bed mobility comments: assist to raise trunk to sitting, assist to scoot to [facility]  Transfers Overall transfer level: Needs assistance Equipment used: Rolling walker (2 wheeled) Transfers: Sit to/from Stand Sit to Stand: Min assist;Mod assist         General transfer comment: min A to rise from bed, mod A to rise from commode. Needed uE support on RW to steady  Ambulation/Gait Ambulation/Gait assistance: Min assist Ambulation Distance (Feet): 25 Feet Assistive device: Rolling walker (2 wheeled) Gait Pattern/deviations: Step-to pattern;Shuffle;Decreased stride length;Trunk flexed Gait velocity: decreased Gait velocity interpretation: Below normal speed for  age/gender General Gait Details: pt displays decreased balance today requiring assistance with RW and navigating bathroom   Stairs            Wheelchair Mobility    Modified Rankin (Stroke Patients Only)       Balance Overall balance assessment: Needs assistance Sitting-balance support: No upper extremity supported;Feet unsupported Sitting balance-Leahy Scale: Fair     Standing balance support: Bilateral upper extremity supported Standing balance-Leahy Scale: Fair                      Cognition Arousal/Alertness: Lethargic Behavior During Therapy: WFL for tasks assessed/performed Overall Cognitive Status: Within Functional Limits for tasks assessed                      Exercises      General Comments General comments (skin integrity, edema, etc.): Pt unsteady today and was unable to control BM on the way to the bathroom, Requiring Max A for hygiene. Deferred further ambulation and stairs today due to loose stool      Pertinent Vitals/Pain Pain Assessment: No/denies pain    Home Living                      Prior Function            PT Goals (current goals can now be found in the care plan section) Acute Rehab PT Goals Patient Stated Goal: go home Progress towards PT goals: Progressing toward goals    Frequency  Min 3X/week    PT Plan Discharge plan needs to be updated    Co-evaluation  End of Session Equipment Utilized During Treatment: Gait belt Activity Tolerance: Treatment limited secondary to medical complications (Comment) Patient left: in bed;with call bell/phone within reach;with bed alarm set     Time: [id number] PT Time Calculation (min) (ACUTE ONLY): 32 min  Charges:  $Gait Training: 8-22 mins $Therapeutic Activity: 8-22 mins                    G Codes:      Ara Kussmaul 08-Apr-2015, 1:18 PM Ara Kussmaul, Student Physical Therapist Acute Rehab [phone]

## 2015-04-04 NOTE — Progress Notes (Signed)
Nutrition Follow-up  DOCUMENTATION CODES:   Severe malnutrition in context of chronic illness, Underweight  INTERVENTION:  Continue Ensure Enlive po BID, each supplement provides 350 kcal and 20 grams of protein.  Downgrade diet to dysphagia 3.   Encourage adequate PO intake.   Recommend obtaining new weight.   NUTRITION DIAGNOSIS:   Malnutrition related to chronic illness as evidenced by severe depletion of body fat, severe depletion of muscle mass; ongoing  GOAL:   Patient will meet greater than or equal to 90% of their needs; progressing  MONITOR:   PO intake, Supplement acceptance, Weight trends, Labs, I & O's, Skin  REASON FOR ASSESSMENT:   Malnutrition Screening Tool    ASSESSMENT:   70 year old female with a past medical history significant for chronic pancreatitis, hypertension, chronic pain, migraines, opioid dependence, history of breast cancer s/p lumpectomy, radiation, and chemotherapy; who presents with complaints of abdominal pain with nausea and vomiting over the last 3 weeks.  Meal completion has been 25-75%. Pt complains of difficulties chewing/cutting up food and would prefer the food cut up before arriving. RD to downgrade diet to a dysphagia 3 diet to aid in adequate PO intake. Pt has Ensure ordered and has been consuming most of them. RD to continue with current orders.   Labs and medications reviewed.   Diet Order:  Diet Heart Room service appropriate?: Yes; Fluid consistency:: Thin  Skin:  Wound (see comment) (Stage II pressure ulcer on back)  Last BM:  1/22  Height:   Ht Readings from Last 1 Encounters:  10/19/14 5\' 2"  (1.575 m)    Weight:   Wt Readings from Last 1 Encounters:  02/23/15 92 lb 12.8 oz (42.094 kg)    Ideal Body Weight:  50 kg  BMI:  There is no weight on file to calculate BMI.  Estimated Nutritional Needs:   Kcal:  1400-1600  Protein:  60-75 grams  Fluid:  >/= 1.5 L/day  EDUCATION NEEDS:   No education  needs identified at this time  Corrin Parker, MS, RD, LDN Pager # [phone] After hours/ weekend pager # [phone]

## 2015-04-04 NOTE — Progress Notes (Signed)
[facility] PROGRESS NOTE  Lindsey Gomez [id number] DOB: 12-18-1945 DOA: 03/30/2015 PCP: Mathews Argyle, MD  Brief narrative 70 year old female with history of chronic pancreatitis, hypertension, chronic pain with opioid dependence, migraine headaches, history of breast cancer status post lumpectomy with radiation and chemotherapy presented to the ED with abdominal pain with nausea and vomiting for over 3 weeks duration. She was seen by gastroenterologist in office and sent to the ED. Patient complained of weight loss but was unable to specify. She reported constant abdominal pain and being unable to keep her food or liquid down. Reported pain to be 10/10 in severity. No aggravating or relieving factors. Workup done showed lipase of 49, acute kidney injury with creatinine of 2.67 and BUN of 40 and over 7.9. UA negative for UTI. CT of the abdomen and pelvis done showed bronchiectasis with nodular opacity at the lung bases possible for multifocal infection. Admitted to hospital service for further management.   Assessment/Plan: Abdominal pain with nausea,vomiting, and diarrhea Possibly acute on chronic. CT of the abdomen showed distention of the CBD 1.7 cm. Has history of cholecystectomy.   Will avoid narcotics at this time. Continue IV hydration, continue protonix and pancreatic enzymes. - Could be secondary to acute viral gastroenteritis - c diff toxin pcr negative as such will administer loperamide  Acute kidney injury Secondary to dehydration. Monitor with IV hydration.  Anion gap metabolic acidosis Secondary to dehydration. Still has low bicarbonate. Gap Close today. Will place on sodium bicarbonate supplement.  Hypokalemia Replace and reassess - last mg level reviewed and within normal limits  Type 2 diabetes mellitus Resume home dose Lantus and sliding scale coverage. Check A1c.  Protein calorie malnutrition Dietitian consulted. Added supplement  Weakness PT  evaluation  ?UTI Pt treated for 3 days with antibiotics and  1 time dose of diflucan  Dementia, mild Continue Namenda.  Chronic migraine Continue home medications.  History of depression Continue home medications  GERD Continue PPI  Chronic neuropathy Continue Lyrica and  Essential hypertension Resume home medications.  Anemia Noted for drop in her H&H. No history of GI bleed. Repeat in a.m.  Diet: Heart healthy DVT prophylaxis: Subcutaneous heparin   Code Status: Full code Family Communication: updated daughter Disposition Plan: pending improvement in condition.   Consultants:  None  Procedures:  CT abdomen and pelvis  Antibiotics:  Rocephin   HPI/Subjective: Pt still complaining of diarrhea.  Objective: Filed Vitals:   04/03/15 1838 04/04/15 1004  BP: 135/75 113/63  Pulse: 74 80  Temp: 97.9 F (36.6 C) 97.5 F (36.4 C)  Resp: 18 18    Intake/Output Summary (Last 24 hours) at 04/04/15 1404 Last data filed at 04/04/15 [id number]  Gross per 24 hour  Intake 1373.33 ml  Output    352 ml  Net 1021.33 ml   There were no vitals filed for this visit.  Exam:   General: Awake and alert  HEENT: No pallor, moist mucosa, supple neck  Chest: Clear bilaterally, no wheezes  CVS: Normal S1 and S2, no murmurs rub or gallop  GI: Soft, mild bilateral lower quadrant and suprapubic tenderness, bowel sounds present, nondistended  Musculoskeletal: Warm, no edema  CNS: Alert and oriented  Data Reviewed: Basic Metabolic Panel:  Recent Labs Lab 03/31/15 0328 03/31/15 2010 04/01/15 0700 04/02/15 0415 04/04/15 0050  NA 143 143 140 141 142  K 3.1* 4.6 3.2* 3.5 2.3*  CL 114* 120* 115* 117* 118*  CO2 12* 9* 13* 20* 17*  GLUCOSE 139*  138* 201* 145* 156*  BUN 40* 29* 25* 15 7  CREATININE 2.21* 1.78* 1.34* 0.96 0.72  CALCIUM 9.0 9.0 8.4* 8.1* 7.5*  MG  --  2.1  --   --   --    Liver Function Tests:  Recent Labs Lab 03/30/15 1644  AST 20  ALT 19   ALKPHOS 83  BILITOT 0.4  PROT 7.1  ALBUMIN 2.7*    Recent Labs Lab 03/30/15 1644  LIPASE 49   No results for input(s): AMMONIA in the last 168 hours. CBC:  Recent Labs Lab 03/30/15 1644 03/31/15 0328 04/01/15 0700 04/02/15 0415 04/04/15 0050  WBC 11.9* 12.6* 4.3 3.4* 4.1  HGB 14.2 12.8 10.4* 9.4* 9.3*  HCT 43.4 39.3 31.1* 27.8* 26.9*  MCV 96.2 96.3 96.3 93.9 91.5  PLT 349 278 144* 153 127*   Cardiac Enzymes: No results for input(s): CKTOTAL, CKMB, CKMBINDEX, TROPONINI in the last 168 hours. BNP (last 3 results) No results for input(s): BNP in the last 8760 hours.  ProBNP (last 3 results) No results for input(s): PROBNP in the last 8760 hours.  CBG:  Recent Labs Lab 04/03/15 1152 04/03/15 1730 04/03/15 2142 04/04/15 0820 04/04/15 1238  GLUCAP 189* 175* 182* 135* 194*    Recent Results (from the past 240 hour(s))  Urine culture     Status: None   Collection Time: 03/30/15  8:09 PM  Result Value Ref Range Status   Specimen Description URINE, CLEAN CATCH  Final   Special Requests NONE  Final   Culture 80,000 COLONIES/ml YEAST  Final   Report Status 04/01/2015 FINAL  Final  C difficile quick scan w PCR reflex     Status: Abnormal   Collection Time: 03/31/15  4:23 PM  Result Value Ref Range Status   C Diff antigen POSITIVE (A) NEGATIVE Final   C Diff toxin NEGATIVE NEGATIVE Final   C Diff interpretation   Final    C. difficile present, but toxin not detected. This indicates colonization. In most cases, this does not require treatment. If patient has signs and symptoms consistent with colitis, consider treatment. Requires ENTERIC precautions.     Studies: No results found.  Scheduled Meds: . atenolol  50 mg Oral Daily  . atorvastatin  10 mg Oral Daily  . dicyclomine  20 mg Oral TID AC & HS  . escitalopram  20 mg Oral Daily  . feeding supplement (ENSURE ENLIVE)  237 mL Oral BID BM  . ferrous sulfate  325 mg Oral Q breakfast  . fluticasone  1 spray  Each Nare Daily  . gabapentin  100 mg Oral BID  . heparin  5,000 Units Subcutaneous 3 times per day  . hydrocortisone  10 mg Oral BID  . insulin aspart  0-9 Units Subcutaneous TID WC  . lipase/protease/amylase  12,000 Units Oral TID AC  . memantine  7 mg Oral BID  . pantoprazole  40 mg Oral Daily  . potassium chloride  10 mEq Intravenous Q2H  . potassium chloride  40 mEq Oral Once  . pregabalin  50 mg Oral BID  . sertraline  25 mg Oral Daily   Continuous Infusions: .  sodium bicarbonate  infusion 1000 mL 75 mL/hr at 04/01/15 1533      Time spent: 25 minutes    Nathaniel Wakeley, [facility]  [facility] Pager [phone]. If 7PM-7AM, please contact night-coverage at [email or web address], password [facility] 04/04/2015, 2:04 PM  LOS: 4 days

## 2015-04-04 NOTE — Progress Notes (Signed)
CRITICAL VALUE ALERT  Critical value received:  Potassium=2.3  Date of notification:  04/04/15  Time of notification:  02:24  Critical value read back:Yes.    Nurse who received alert:  Leandro Reasoner  MD notified (1st page):  Pincus Sanes, PA-C  Time of first page:  02:34  MD notified (2nd page): Pincus Sanes, PA-C  Time of second page:03:34  Responding MD:  Pincus Sanes, PA-C  Time MD responded:  04:15

## 2015-04-04 NOTE — Progress Notes (Signed)
Inpatient Diabetes Program Recommendations  AACE/ADA: New Consensus Statement on Inpatient Glycemic Control (2015)  Target Ranges:  Prepandial:   less than 140 mg/dL      Peak postprandial:   less than 180 mg/dL (1-2 hours)      Critically ill patients:  140 - 180 mg/dL  Results for Lindsey Gomez, Lindsey Gomez (MRN [id number]) as of 04/04/2015 13:57  Ref. Range 04/03/2015 08:01 04/03/2015 11:52 04/03/2015 17:30 04/03/2015 21:42 04/04/2015 08:20 04/04/2015 12:38  Glucose-Capillary Latest Ref Range: 65-99 mg/dL 155 (H) 189 (H) 175 (H) 182 (H) 135 (H) 194 (H)   Review of Glycemic Control  Outpatient Diabetes medications: Levemir 11 units QHS, Novolog 0-8 units TID with meals Current orders for Inpatient glycemic control: Novolog 0-9 units TID with meals  Inpatient Diabetes Program Recommendations: Correction (SSI): Poor PO intake noted. Please consider changing frequency of CBGs and Novolog to Q4H.  Thanks, Barnie Alderman, RN, MSN, CDE Diabetes Coordinator Inpatient Diabetes Program [phone] (Team Pager from [place] to [place]) [phone] ([place] office) [phone] [facility] office) [phone] [facility] office)

## 2015-04-04 NOTE — Progress Notes (Signed)
Patient's daughter Marvene Staff wanted to speak with MD.  Her phone number is [phone].  Dr. Wendee Beavers notified.

## 2015-04-05 DIAGNOSIS — R109 Unspecified abdominal pain: Secondary | ICD-10-CM

## 2015-04-05 LAB — GASTROINTESTINAL PANEL BY PCR, STOOL (REPLACES STOOL CULTURE)
ADENOVIRUS F40/41: NOT DETECTED
Astrovirus: NOT DETECTED
CRYPTOSPORIDIUM: NOT DETECTED
CYCLOSPORA CAYETANENSIS: NOT DETECTED
Campylobacter species: NOT DETECTED
E. coli O157: NOT DETECTED
ENTAMOEBA HISTOLYTICA: NOT DETECTED
ENTEROAGGREGATIVE E COLI (EAEC): NOT DETECTED
ENTEROPATHOGENIC E COLI (EPEC): NOT DETECTED
Enterotoxigenic E coli (ETEC): NOT DETECTED
GIARDIA LAMBLIA: NOT DETECTED
Norovirus GI/GII: NOT DETECTED
Plesimonas shigelloides: NOT DETECTED
Rotavirus A: NOT DETECTED
Salmonella species: NOT DETECTED
Sapovirus (I, II, IV, and V): NOT DETECTED
Shiga like toxin producing E coli (STEC): NOT DETECTED
Shigella/Enteroinvasive E coli (EIEC): NOT DETECTED
VIBRIO CHOLERAE: NOT DETECTED
VIBRIO SPECIES: NOT DETECTED
YERSINIA ENTEROCOLITICA: NOT DETECTED

## 2015-04-05 LAB — BASIC METABOLIC PANEL
Anion gap: 10 (ref 5–15)
Anion gap: 9 (ref 5–15)
BUN: 6 mg/dL (ref 6–20)
BUN: 6 mg/dL (ref 6–20)
CALCIUM: 8 mg/dL — AB (ref 8.9–10.3)
CHLORIDE: 118 mmol/L — AB (ref 101–111)
CHLORIDE: 117 mmol/L — AB (ref 101–111)
CO2: 18 mmol/L — AB (ref 22–32)
CO2: 15 mmol/L — ABNORMAL LOW (ref 22–32)
CREATININE: 0.89 mg/dL (ref 0.44–1.00)
Calcium: 7.9 mg/dL — ABNORMAL LOW (ref 8.9–10.3)
Creatinine, Ser: 0.94 mg/dL (ref 0.44–1.00)
GFR calc Af Amer: >60 mL/min (ref >60)
GFR calc non Af Amer: >60 mL/min (ref >60)
GFR calc non Af Amer: >60 mL/min (ref >60)
Glucose, Bld: 165 mg/dL — ABNORMAL HIGH (ref 65–99)
Glucose, Bld: 251 mg/dL — ABNORMAL HIGH (ref 65–99)
POTASSIUM: 3.6 mmol/L (ref 3.5–5.1)
Potassium: 3.1 mmol/L — ABNORMAL LOW (ref 3.5–5.1)
SODIUM: 142 mmol/L (ref 135–145)
SODIUM: 145 mmol/L (ref 135–145)

## 2015-04-05 LAB — GLUCOSE, CAPILLARY
GLUCOSE-CAPILLARY: 263 mg/dL — AB (ref 65–99)
GLUCOSE-CAPILLARY: 287 mg/dL — AB (ref 65–99)
Glucose-Capillary: 140 mg/dL — ABNORMAL HIGH (ref 65–99)
Glucose-Capillary: 256 mg/dL — ABNORMAL HIGH (ref 65–99)

## 2015-04-05 MED ORDER — POTASSIUM CHLORIDE CRYS ER 20 MEQ PO TBCR
40.0000 meq | EXTENDED_RELEASE_TABLET | Freq: Once | ORAL | Status: AC
  Administered 2015-04-05: 40 meq via ORAL
  Filled 2015-04-05: qty 2

## 2015-04-05 MED ORDER — POTASSIUM CL IN DEXTROSE 5% 20 MEQ/L IV SOLN
20.0000 meq | INTRAVENOUS | Status: DC
  Administered 2015-04-05: 20 meq via INTRAVENOUS
  Filled 2015-04-05 (×2): qty 1000

## 2015-04-05 NOTE — Progress Notes (Signed)
Physical Therapy Treatment Patient Details Name: Lindsey Gomez MRN: [id number] DOB: Jun 11, 1945 Today's Date: 04/05/2015    History of Present Illness Patient is a 70 yo female admitted 03/30/15 with abdominal pain, nausea, vomiting, AKI.   PMH: chronic pancreatitis, HTN, chronic pain, opioid dependence, depression, anxiety, COPD, DM, Parkinson's    PT Comments    Continues with unsteadiness in standing activities, definitely benefits from assistive device for steadiness; would like to verify that she has 24 hour assistance at home  Follow Up Recommendations  Home health PT;Supervision/Assistance - 24 hour     Equipment Recommendations  3in1 (PT)    Recommendations for Other Services       Precautions / Restrictions Precautions Precautions: Fall    Mobility  Bed Mobility Overal bed mobility: Needs Assistance Bed Mobility: Supine to Sit;Sit to Supine     Supine to sit: Modified independent (Device/Increase time) Sit to supine: Min assist   General bed mobility comments: min assist to help LEs back into bed  Transfers Overall transfer level: Needs assistance Equipment used: Rolling walker (2 wheeled) Transfers: Sit to/from Stand Sit to Stand: Min assist         General transfer comment: Min assist to steady; has strength to power up, but posterior lean  Ambulation/Gait Ambulation/Gait assistance: Min assist Ambulation Distance (Feet): 150 Feet Assistive device: Rolling walker (2 wheeled) Gait Pattern/deviations: Step-through pattern;Decreased stride length;Shuffle;Trunk flexed Gait velocity: decreased   General Gait Details: Patient did better today with balance; required close guarding for balance.  Did lose balance 1 x posteriorly but able to regain.  Required cueing to not get "ahead of walker" and to ambulate at safe pace.   Stairs Stairs: Yes Stairs assistance: Mod assist Stair Management: One rail Right;Step to pattern;Sideways Number of Stairs:  2 General stair comments: Mod assist to help power up each step; pt tells me she will have assist to go up her steps at home  Wheelchair Mobility    Modified Rankin (Stroke Patients Only)       Balance     Sitting balance-Leahy Scale: Fair       Standing balance-Leahy Scale: Poor Standing balance comment: unsteady on feet                    Cognition Arousal/Alertness: Awake/alert Behavior During Therapy: WFL for tasks assessed/performed;Anxious Overall Cognitive Status: Within Functional Limits for tasks assessed                      Exercises      General Comments        Pertinent Vitals/Pain Pain Assessment: No/denies pain    Home Living                      Prior Function            PT Goals (current goals can now be found in the care plan section) Acute Rehab PT Goals Patient Stated Goal: go home PT Goal Formulation: With patient Time For Goal Achievement: 04/07/15 Potential to Achieve Goals: Good Progress towards PT goals: Progressing toward goals    Frequency  Min 3X/week    PT Plan      Co-evaluation             End of Session Equipment Utilized During Treatment: Gait belt Activity Tolerance: Patient tolerated treatment well Patient left: in bed;with call bell/phone within reach;with bed alarm set     Time: [id number]  PT Time Calculation (min) (ACUTE ONLY): 23 min  Charges:  $Gait Training: 8-22 mins $Therapeutic Activity: 8-22 mins                    G Codes:      Quin Hoop 04/05/2015, 4:32 PM  Roney Marion, [place] Pager [phone] Office [phone]

## 2015-04-05 NOTE — Progress Notes (Signed)
[facility] PROGRESS NOTE  JAIDENCE WINGROVE [id number] DOB: 11-18-1945 DOA: 03/30/2015 PCP: Mathews Argyle, MD  Brief narrative 70 year old female with history of chronic pancreatitis, hypertension, chronic pain with opioid dependence, migraine headaches, history of breast cancer status post lumpectomy with radiation and chemotherapy presented to the ED with abdominal pain with nausea and vomiting for over 3 weeks duration. She was seen by gastroenterologist in office and sent to the ED. Patient complained of weight loss but was unable to specify. She reported constant abdominal pain and being unable to keep her food or liquid down. Reported pain to be 10/10 in severity. No aggravating or relieving factors. Workup done showed lipase of 49, acute kidney injury with creatinine of 2.67 and BUN of 40 and over 7.9. UA negative for UTI. CT of the abdomen and pelvis done showed bronchiectasis with nodular opacity at the lung bases possible for multifocal infection. Admitted to hospital service for further management.   Assessment/Plan: Abdominal pain with nausea,vomiting, and diarrhea Possibly acute on chronic. CT of the abdomen showed distention of the CBD 1.7 cm. Has history of cholecystectomy.   Will avoid narcotics at this time. Continue IV hydration, continue protonix and pancreatic enzymes. - Could be secondary to acute viral gastroenteritis - c diff toxin pcr negative as such will administer loperamide  Acute kidney injury - Secondary to dehydration. Monitor with IV hydration.  Anion gap metabolic acidosis Secondary to dehydration. Still has low bicarbonate. Obtain ABG next am.  Hypokalemia Replace and reassess - last mg level reviewed and within normal limits  Type 2 diabetes mellitus Resume home dose Lantus and sliding scale coverage. Check A1c.  Protein calorie malnutrition Dietitian consulted. Added supplement  Weakness PT evaluation  ?UTI Pt treated for 3 days  with antibiotics and  1 time dose of diflucan  Dementia, mild Continue Namenda.  Chronic migraine Continue home medications.  History of depression Continue home medications  GERD Continue PPI  Chronic neuropathy Continue Lyrica and  Essential hypertension Resume home medications.  Anemia Noted for drop in her H&H. No history of GI bleed. Repeat in a.m.  Diet: Heart healthy DVT prophylaxis: Subcutaneous heparin   Code Status: Full code Family Communication: updated daughter Disposition Plan: pending improvement in condition.   Consultants:  None  Procedures:  CT abdomen and pelvis  Antibiotics:  Rocephin   HPI/Subjective: Pt reports feeling better. No reported diarrhea this morning.  Objective: Filed Vitals:   04/05/15 0443 04/05/15 0843  BP: 106/67 132/73  Pulse: 67 66  Temp: 98.5 F (36.9 C) 98.8 F (37.1 C)  Resp: 16 14    Intake/Output Summary (Last 24 hours) at 04/05/15 1756 Last data filed at 04/05/15 1500  Gross per 24 hour  Intake    430 ml  Output    676 ml  Net   -246 ml   There were no vitals filed for this visit.  Exam:   General: Awake and alert  HEENT: No pallor, moist mucosa, supple neck  Chest: Clear bilaterally, no wheezes  CVS: Normal S1 and S2, no murmurs rub or gallop  GI: Soft, mild bilateral lower quadrant and suprapubic tenderness, bowel sounds present, nondistended  Musculoskeletal: Warm, no edema  CNS: Alert and oriented  Data Reviewed: Basic Metabolic Panel:  Recent Labs Lab 03/31/15 2010 04/01/15 0700 04/02/15 0415 04/04/15 0050 04/05/15 0040 04/05/15 1510  NA 143 140 141 142 145 142  K 4.6 3.2* 3.5 2.3* 3.1* 3.6  CL 120* 115* 117* 118* 117* 118*  CO2 9* 13* 20* 17* 18* 15*  GLUCOSE 138* 201* 145* 156* 165* 251*  BUN 29* 25* 15 7 6 6   CREATININE 1.78* 1.34* 0.96 0.72 0.89 0.94  CALCIUM 9.0 8.4* 8.1* 7.5* 8.0* 7.9*  MG 2.1  --   --   --   --   --    Liver Function Tests:  Recent  Labs Lab 03/30/15 1644  AST 20  ALT 19  ALKPHOS 83  BILITOT 0.4  PROT 7.1  ALBUMIN 2.7*    Recent Labs Lab 03/30/15 1644  LIPASE 49   No results for input(s): AMMONIA in the last 168 hours. CBC:  Recent Labs Lab 03/30/15 1644 03/31/15 0328 04/01/15 0700 04/02/15 0415 04/04/15 0050  WBC 11.9* 12.6* 4.3 3.4* 4.1  HGB 14.2 12.8 10.4* 9.4* 9.3*  HCT 43.4 39.3 31.1* 27.8* 26.9*  MCV 96.2 96.3 96.3 93.9 91.5  PLT 349 278 144* 153 127*   Cardiac Enzymes: No results for input(s): CKTOTAL, CKMB, CKMBINDEX, TROPONINI in the last 168 hours. BNP (last 3 results) No results for input(s): BNP in the last 8760 hours.  ProBNP (last 3 results) No results for input(s): PROBNP in the last 8760 hours.  CBG:  Recent Labs Lab 04/04/15 1658 04/04/15 2243 04/05/15 0834 04/05/15 1149 04/05/15 1711  GLUCAP 200* 183* 140* 263* 287*    Recent Results (from the past 240 hour(s))  Urine culture     Status: None   Collection Time: 03/30/15  8:09 PM  Result Value Ref Range Status   Specimen Description URINE, CLEAN CATCH  Final   Special Requests NONE  Final   Culture 80,000 COLONIES/ml YEAST  Final   Report Status 04/01/2015 FINAL  Final  C difficile quick scan w PCR reflex     Status: Abnormal   Collection Time: 03/31/15  4:23 PM  Result Value Ref Range Status   C Diff antigen POSITIVE (A) NEGATIVE Final   C Diff toxin NEGATIVE NEGATIVE Final   C Diff interpretation   Final    C. difficile present, but toxin not detected. This indicates colonization. In most cases, this does not require treatment. If patient has signs and symptoms consistent with colitis, consider treatment. Requires ENTERIC precautions.  Gastrointestinal Panel by PCR , Stool     Status: None   Collection Time: 04/04/15  5:09 PM  Result Value Ref Range Status   Campylobacter species NOT DETECTED NOT DETECTED Final   Plesimonas shigelloides NOT DETECTED NOT DETECTED Final   Salmonella species NOT DETECTED NOT  DETECTED Final   Yersinia enterocolitica NOT DETECTED NOT DETECTED Final   Vibrio species NOT DETECTED NOT DETECTED Final   Vibrio cholerae NOT DETECTED NOT DETECTED Final   Enteroaggregative E coli (EAEC) NOT DETECTED NOT DETECTED Final   Enteropathogenic E coli (EPEC) NOT DETECTED NOT DETECTED Final   Enterotoxigenic E coli (ETEC) NOT DETECTED NOT DETECTED Final   Shiga like toxin producing E coli (STEC) NOT DETECTED NOT DETECTED Final   E. coli O157 NOT DETECTED NOT DETECTED Final   Shigella/Enteroinvasive E coli (EIEC) NOT DETECTED NOT DETECTED Final   Cryptosporidium NOT DETECTED NOT DETECTED Final   Cyclospora cayetanensis NOT DETECTED NOT DETECTED Final   Entamoeba histolytica NOT DETECTED NOT DETECTED Final   Giardia lamblia NOT DETECTED NOT DETECTED Final   Adenovirus F40/41 NOT DETECTED NOT DETECTED Final   Astrovirus NOT DETECTED NOT DETECTED Final   Norovirus GI/GII NOT DETECTED NOT DETECTED Final   Rotavirus A NOT DETECTED NOT  DETECTED Final   Sapovirus (I, II, IV, and V) NOT DETECTED NOT DETECTED Final     Studies: No results found.  Scheduled Meds: . atenolol  50 mg Oral Daily  . atorvastatin  10 mg Oral Daily  . dicyclomine  20 mg Oral TID AC & HS  . escitalopram  20 mg Oral Daily  . feeding supplement (ENSURE ENLIVE)  237 mL Oral BID BM  . ferrous sulfate  325 mg Oral Q breakfast  . fluticasone  1 spray Each Nare Daily  . gabapentin  100 mg Oral BID  . hydrocortisone  10 mg Oral BID  . insulin aspart  0-9 Units Subcutaneous TID WC  . lipase/protease/amylase  12,000 Units Oral TID AC  . memantine  7 mg Oral BID  . pantoprazole  40 mg Oral Daily  . pregabalin  50 mg Oral BID  . sertraline  25 mg Oral Daily   Continuous Infusions: .  sodium bicarbonate  infusion 1000 mL 75 mL/hr at 04/01/15 1533      Time spent: 25 minutes    Tami Blass, [facility]  [facility] Pager [phone]. If 7PM-7AM, please contact night-coverage at [email or web address], password  [facility] 04/05/2015, 5:56 PM  LOS: 5 days

## 2015-04-06 LAB — BLOOD GAS, ARTERIAL
Acid-base deficit: 5.6 mmol/L — ABNORMAL HIGH (ref 0.0–2.0)
BICARBONATE: 17.4 meq/L — AB (ref 20.0–24.0)
Drawn by: 418751
FIO2: 0.21
O2 SAT: 97 %
PCO2 ART: 24.2 mmHg — AB (ref 35.0–45.0)
PH ART: 7.47 — AB (ref 7.350–7.450)
PO2 ART: 80.8 mmHg (ref 80.0–100.0)
Patient temperature: 98.6
TCO2: 18.1 mmol/L (ref 0–100)

## 2015-04-06 LAB — CBC
HCT: 28.8 % — ABNORMAL LOW (ref 36.0–46.0)
HEMOGLOBIN: 9.8 g/dL — AB (ref 12.0–15.0)
MCH: 31.9 pg (ref 26.0–34.0)
MCHC: 34 g/dL (ref 30.0–36.0)
MCV: 93.8 fL (ref 78.0–100.0)
Platelets: 99 10*3/uL — ABNORMAL LOW (ref 150–400)
RBC: 3.07 MIL/uL — ABNORMAL LOW (ref 3.87–5.11)
RDW: 17.6 % — ABNORMAL HIGH (ref 11.5–15.5)
WBC: 4 10*3/uL (ref 4.0–10.5)

## 2015-04-06 LAB — GLUCOSE, CAPILLARY
GLUCOSE-CAPILLARY: 297 mg/dL — AB (ref 65–99)
Glucose-Capillary: 143 mg/dL — ABNORMAL HIGH (ref 65–99)

## 2015-04-06 NOTE — Care Management Note (Addendum)
Case Management Note  Patient Details  Name: Lindsey Gomez MRN: [id number] Date of Birth: 10-21-1945  Subjective/Objective:        CM following for progression and d/c planning.            Action/Plan: 04/06/2015 Met with pt who selected [facility] for [facility]HP[facility] and request therapist, Barnabas Lister as her PT provider,as she has worked with him previously.   Expected Discharge Date:    04/05/18              Expected Discharge Plan:  [place]  In-House Referral:  NA  Discharge planning Services  CM Consult  Post Acute Care Choice:  Home Health Choice offered to:  Patient  DME Arranged:   NA DME Agency:   NA   HH Arranged:  PT [place] Agency:  [place]  Status of Service:  Completed, signed off  Medicare Important Message Given:  Yes Date Medicare IM Given:    Medicare IM give by:    Date Additional Medicare IM Given:    Additional Medicare Important Message give by:     If discussed at [place] of Stay Meetings, dates discussed:    Additional Comments:Pt declined 3:1 commode.   Raeanna Soberanes, Rory Percy, RN 04/06/2015, 11:33 AM

## 2015-04-06 NOTE — Care Management Important Message (Signed)
Important Message  Patient Details  Name: Lindsey Gomez MRN: [id number] Date of Birth: 1945/12/18   Medicare Important Message Given:  Yes    Laiyla Slagel, Rory Percy, RN 04/06/2015, 11:05 AM

## 2015-04-06 NOTE — Discharge Summary (Signed)
Physician Discharge Summary  Lindsey Gomez [id number] DOB: 11-14-1945 DOA: 03/30/2015  PCP: Mathews Argyle, MD  Admit date: 03/30/2015 Discharge date: 04/06/2015  Time spent: > 35 minutes  Recommendations for Outpatient Follow-up:  1. Monitor hgb levels 2. Monitor K levels 3. GI pathogen panel negative. Have recommended patient f/u with her gastroenterologist 4. Recommend patient follow up with pcp to further assess mild metabolic alkalosis. Last PH 7.47   Discharge Diagnoses:  Active Problems:   PANCREATITIS, CHRONIC   Hypoglycemia   Opioid dependence (HCC)   Diarrhea   Hypokalemia   AKI (acute kidney injury) (Experiment)   Urinary tract infection   Nausea & vomiting   Leukocytosis   Abdominal pain   Pressure ulcer   Discharge Condition: stable  Diet recommendation: dysphagia  3 diet Filed Weights   04/05/15 2020  Weight: 43.999 kg (97 lb)    History of present illness:  From original HPI: 70 year old female with history of chronic pancreatitis, hypertension, chronic pain with opioid dependence, migraine headaches, history of breast cancer status post lumpectomy with radiation and chemotherapy presented to the ED with abdominal pain with nausea and vomiting for over 3 weeks duration. She was seen by gastroenterologist in office and sent to the ED. Patient complained of weight loss but was unable to specify. She reported constant abdominal pain and being unable to keep her food or liquid down. Reported pain to be 10/10 in severity. No aggravating or relieving factors. Workup done showed lipase of 49, acute kidney injury with creatinine of 2.67 and BUN of 40 and over 7.9. UA negative for UTI. CT of the abdomen and pelvis done showed bronchiectasis with nodular opacity at the lung bases possible for multifocal infection. Admitted to hospital service for further management.  Hospital Course:   Abdominal pain with nausea,vomiting, and diarrhea Possibly acute on chronic. CT  of the abdomen showed distention of the CBD 1.7 cm. Has history of cholecystectomy.  Continue pancreatic enzymes. - GI pathogen panel negative. Recommend patient f/u with her gastroenterologist. Will d/c protonix given history of c diff colonization (of note toxin negative) - nausea and emesis resolved, patient reports eating better today. - suspect patient may have chronic diarrhea and as such will continue her K replacement (listed on home medication regimen) on discharge.  Acute kidney injury - has resolved  Anion gap metabolic alkalosis - suspect improvement with improvement in condition. Most likely due to gastric secretions which reportedly are slowing down  Hypokalemia Replace and reassess - last mg level reviewed and within normal limits  Type 2 diabetes mellitus Resume home dose Lantus and sliding scale coverage. Check A1c.  Protein calorie malnutrition Dietitian consulted. Added supplement  Weakness PT evaluation  ?UTI Pt treated for 3 days with antibiotics and 1 time dose of diflucan  Dementia, mild Continue Namenda.  Chronic migraine Continue home medications.  History of depression Continue home medications  GERD Continue PPI  Chronic neuropathy Continue Lyrica and  Essential hypertension Resume home medications.  Anemia Noted for drop in her H&H. No history of GI bleed. Repeat in a.m.   Procedures:  None  Consultations:  None  Discharge Exam: Filed Vitals:   04/06/15 0503 04/06/15 0930  BP: 126/79 120/79  Pulse: 64 68  Temp: 98.8 F (37.1 C) 98.2 F (36.8 C)  Resp: 16 20    General: Pt in nad, alert and awake Cardiovascular: rrr, no mrg Respiratory: cta bl, no wheezes  Discharge Instructions   Discharge Instructions  Call MD for:  extreme fatigue    Complete by:  As directed      Call MD for:  severe uncontrolled pain    Complete by:  As directed      Call MD for:  temperature >100.4    Complete by:  As directed       Diet - low sodium heart healthy    Complete by:  As directed      Discharge instructions    Complete by:  As directed   Please review your home medication list with your primary care physician and your gastroenterologist after discharge.   Recommend following up with your gastroenterologist in 1-2 weeks after hospital discharge.     Increase activity slowly    Complete by:  As directed           Current Discharge Medication List    CONTINUE these medications which have NOT CHANGED   Details  acetaminophen (TYLENOL) 500 MG tablet Take 500 mg by mouth every 6 (six) hours as needed.    atenolol (TENORMIN) 100 MG tablet Take 1 tablet (100 mg total) by mouth every morning. Qty: 30 tablet, Refills: 11    atorvastatin (LIPITOR) 10 MG tablet Take 10 mg by mouth daily.  Refills: 11    docusate sodium (COLACE) 100 MG capsule Take 100 mg by mouth daily as needed for mild constipation.    escitalopram (LEXAPRO) 20 MG tablet Take 1 tablet (20 mg total) by mouth daily. Qty: 30 tablet, Refills: 2   Associated Diagnoses: Generalized anxiety disorder    ferrous sulfate 325 (65 FE) MG tablet Take 325 mg by mouth daily with breakfast.    gabapentin (NEURONTIN) 100 MG capsule TAKE ONE CAPSULE BY MOUTH TWICE DAILY FOR 90 DAYS Refills: 3    hydrocortisone (CORTEF) 10 MG tablet Take 10 mg by mouth 2 (two) times daily.     hyoscyamine (LEVBID) 0.375 MG 12 hr tablet TAKE ONE TAB TWICE A DAY Qty: 60 tablet, Refills: 1    insulin aspart (NOVOLOG) 100 UNIT/ML FlexPen Inject 0-8 Units into the skin 3 (three) times daily with meals. 70-120 = 0 units, 121-150 = 1 unit, 151-200 = 2 units, 201-250 = 3 units, 251-300 = 5 units, 301-350 = 7 units.    KLOR-CON M10 10 MEQ tablet TAKE 1 TABLET WITH FOOD ONCE A DAY ORALLY 30 DAY(S) Refills: 0    LEVEMIR FLEXTOUCH 100 UNIT/ML Pen Inject 11 Units into the skin at bedtime.  Refills: 3    LORazepam (ATIVAN) 1 MG tablet Take 1 tablet (1 mg total) by mouth 2  (two) times daily. Qty: 60 tablet, Refills: 3    Melatonin 10 MG TABS Take 1 tablet by mouth at bedtime.    memantine (NAMENDA XR) 7 MG CP24 24 hr capsule Take 7 mg by mouth 2 (two) times daily.    mometasone (NASONEX) 50 MCG/ACT nasal spray Place 2 sprays into both nostrils daily as needed (for congestion).     ondansetron (ZOFRAN-ODT) 8 MG disintegrating tablet Take 1 tablet by mouth every 8 (eight) hours as needed. Refills: 2    pregabalin (LYRICA) 50 MG capsule Take 1 capsule (50 mg total) by mouth 2 (two) times daily. Qty: 60 capsule, Refills: 3    Probiotic Product (PROBIOTIC DAILY PO) Take by mouth daily.    sertraline (ZOLOFT) 25 MG tablet Take 25 mg by mouth daily.    topiramate (TOPAMAX) 100 MG tablet TAKE 1 TABLET BY MOUTH TWICE DAILY  Qty: 30 tablet, Refills: 0    triamcinolone (KENALOG) 0.1 % ointment Apply 1 application topically 2 (two) times daily as needed (itching).     ACCU-CHEK AVIVA PLUS test strip Refills: 3    BD PEN NEEDLE NANO U/F 32G X 4 MM MISC Refills: 3    isometheptene-acetaminophen-dichloralphenazone (MIDRIN) 65-325-100 MG capsule Take 1 capsule by mouth 4 (four) times daily as needed for migraine. Maximum 5 capsules in 12 hours for migraine headaches, 8 capsules in 24 hours for tension headaches.    lipase/protease/amylase (CREON) 12000 UNITS CPEP capsule Take 1 capsule (12,000 Units total) by mouth 3 (three) times daily before meals. Qty: 270 capsule, Refills: 2   Associated Diagnoses: Diarrhea    zolmitriptan (ZOMIG) 5 MG tablet Take 1 tablet (5 mg total) by mouth as needed for migraine (max 2 tabs daily). Qty: 10 tablet, Refills: 1      STOP taking these medications     doxycycline (VIBRA-TABS) 100 MG tablet      Misc Natural Products (DAILY [facility] PO)      omeprazole (PRILOSEC) 40 MG capsule      Botulinum Toxin Type A 200 UNITS SOLR      ondansetron (ZOFRAN) 8 MG tablet      Vortioxetine HBr (TRINTELLIX) 20 MG TABS         Allergies  Allergen Reactions  . Milnacipran Swelling and Other (See Comments)    Headache and hand swelling   . Rizatriptan Benzoate Nausea And Vomiting  . Gabapentin Other (See Comments)    Weak muscles  . Moxifloxacin Other (See Comments)    Unknown  . Sulfonamide Derivatives Rash  . Venlafaxine Other (See Comments)    Unknown      The results of significant diagnostics from this hospitalization (including imaging, microbiology, ancillary and laboratory) are listed below for reference.    Significant Diagnostic Studies: Ct Abdomen Pelvis Wo Contrast  03/31/2015  CLINICAL DATA:  Acute onset of generalized abdominal pain, nausea, vomiting and diarrhea. Loss of weight. Initial encounter. EXAM: CT ABDOMEN AND PELVIS WITHOUT CONTRAST TECHNIQUE: Multidetector CT imaging of the abdomen and pelvis was performed following the standard protocol without IV contrast. COMPARISON:  CT of the abdomen and pelvis performed 12/31/2013, and renal ultrasound performed 04/10/2014 FINDINGS: Nodular opacity at the lung bases may reflect multifocal infection, as it is largely new from 2015. Underlying bronchiectasis is noted. Scattered coronary artery calcifications are seen. The liver and spleen are unremarkable in appearance. The patient is status post cholecystectomy, with clips noted along the gallbladder fossa. There is distention of the common bile duct to 1.7 cm, with underlying intrahepatic biliary ductal prominence. Scattered calcification is noted at the pancreatic head, reflecting sequelae of chronic pancreatitis. The adrenal glands are grossly unremarkable in appearance. Scattered calcifications at the renal hila are thought to be vascular in nature. An apparent 1.2 cm cyst is suggested at the interpole region of the left kidney. Mild nonspecific perinephric stranding is noted bilaterally. There is no evidence of hydronephrosis. No obstructing ureteral stones are identified. No free fluid is  identified. The small bowel is unremarkable in appearance. The stomach is filled with contrast and is within normal limits. Surrounding postoperative change is noted. No acute vascular abnormalities are seen. Diffuse calcification is noted along the abdominal aorta and its branches. The patient is status post appendectomy. The colon is largely filled with fluid and air, and is grossly unremarkable in appearance. The bladder is moderately distended and grossly unremarkable.  The patient is status post hysterectomy. No suspicious adnexal masses are seen. No inguinal lymphadenopathy is seen. No acute osseous abnormalities are identified. IMPRESSION: 1. Nodular opacity at the lung bases may reflect multifocal infection, as it is new from 2015. Underlying bronchiectasis noted. 2. Scattered calcification at the pancreatic head, reflecting sequelae of chronic pancreatitis. 3. Left renal cyst noted. 4. Scattered coronary artery calcifications seen. 5. Diffuse calcification along the abdominal aorta and its branches. 6. Colon largely filled with fluid and air, and grossly unremarkable in appearance. Electronically Signed   By: Garald Balding M.D.   On: 03/31/2015 01:59   US Renal  03/31/2015  CLINICAL DATA:  Acute renal failure. EXAM: RENAL / URINARY TRACT ULTRASOUND COMPLETE COMPARISON:  04/10/2014 and abdomen CT dated 03/31/2015. FINDINGS: Right Kidney: Length: 10.7 cm. Mildly diffusely echogenic. 5 mm mid to lower renal calculus. Diffuse cortical thinning and prominent renal sinus fat. No hydronephrosis. Left Kidney: Length: 11.6 cm. Mildly diffusely echogenic. 1.9 cm upper pole cyst. 7 mm lower pole calculus. No hydronephrosis. Bladder: Appears normal for degree of bladder distention. The patient was unable to urinate. The calculated bladder volume is 589 cc. IMPRESSION: 1. Mildly diffusely increased echogenicity of both kidneys, compatible with medical renal disease. 2. No hydronephrosis. 3. Small bilateral renal  calculi. 4. Inability to urinate with a calculated bladder volume of 589 cc. Electronically Signed   By: Claudie Revering M.D.   On: 03/31/2015 17:21   Dg Chest Port 1 View  03/31/2015  CLINICAL DATA:  Productive cough EXAM: PORTABLE CHEST 1 VIEW COMPARISON:  08/04/2014 FINDINGS: Cardiomediastinal silhouette is stable. Bilateral emphysematous changes are again noted. Old fractures of the right sixth and seventh rib. There is new nodular consolidation right lower lobe perihilar region measures 3.5 cm. Mass or adenopathy cannot be excluded. Further correlation with CT scan of the chest preferably with IV contrast is recommended. Patchy right base medially atelectasis or infiltrate. IMPRESSION: There is new nodular consolidation right lower lobe perihilar region measures 3.5 cm. Mass or adenopathy cannot be excluded. Further correlation with CT scan of the chest preferably with IV contrast is recommended. Patchy right base medially atelectasis or infiltrate. Electronically Signed   By: Lahoma Crocker M.D.   On: 03/31/2015 16:59    Microbiology: Recent Results (from the past 240 hour(s))  Urine culture     Status: None   Collection Time: 03/30/15  8:09 PM  Result Value Ref Range Status   Specimen Description URINE, CLEAN CATCH  Final   Special Requests NONE  Final   Culture 80,000 COLONIES/ml YEAST  Final   Report Status 04/01/2015 FINAL  Final  C difficile quick scan w PCR reflex     Status: Abnormal   Collection Time: 03/31/15  4:23 PM  Result Value Ref Range Status   C Diff antigen POSITIVE (A) NEGATIVE Final   C Diff toxin NEGATIVE NEGATIVE Final   C Diff interpretation   Final    C. difficile present, but toxin not detected. This indicates colonization. In most cases, this does not require treatment. If patient has signs and symptoms consistent with colitis, consider treatment. Requires ENTERIC precautions.  Gastrointestinal Panel by PCR , Stool     Status: None   Collection Time: 04/04/15  5:09 PM   Result Value Ref Range Status   Campylobacter species NOT DETECTED NOT DETECTED Final   Plesimonas shigelloides NOT DETECTED NOT DETECTED Final   Salmonella species NOT DETECTED NOT DETECTED Final   Yersinia enterocolitica NOT  DETECTED NOT DETECTED Final   Vibrio species NOT DETECTED NOT DETECTED Final   Vibrio cholerae NOT DETECTED NOT DETECTED Final   Enteroaggregative E coli (EAEC) NOT DETECTED NOT DETECTED Final   Enteropathogenic E coli (EPEC) NOT DETECTED NOT DETECTED Final   Enterotoxigenic E coli (ETEC) NOT DETECTED NOT DETECTED Final   Shiga like toxin producing E coli (STEC) NOT DETECTED NOT DETECTED Final   E. coli O157 NOT DETECTED NOT DETECTED Final   Shigella/Enteroinvasive E coli (EIEC) NOT DETECTED NOT DETECTED Final   Cryptosporidium NOT DETECTED NOT DETECTED Final   Cyclospora cayetanensis NOT DETECTED NOT DETECTED Final   Entamoeba histolytica NOT DETECTED NOT DETECTED Final   Giardia lamblia NOT DETECTED NOT DETECTED Final   Adenovirus F40/41 NOT DETECTED NOT DETECTED Final   Astrovirus NOT DETECTED NOT DETECTED Final   Norovirus GI/GII NOT DETECTED NOT DETECTED Final   Rotavirus A NOT DETECTED NOT DETECTED Final   Sapovirus (I, II, IV, and V) NOT DETECTED NOT DETECTED Final     Labs: Basic Metabolic Panel:  Recent Labs Lab 03/31/15 2010 04/01/15 0700 04/02/15 0415 04/04/15 0050 04/05/15 0040 04/05/15 1510  NA 143 140 141 142 145 142  K 4.6 3.2* 3.5 2.3* 3.1* 3.6  CL 120* 115* 117* 118* 117* 118*  CO2 9* 13* 20* 17* 18* 15*  GLUCOSE 138* 201* 145* 156* 165* 251*  BUN 29* 25* 15 7 6 6   CREATININE 1.78* 1.34* 0.96 0.72 0.89 0.94  CALCIUM 9.0 8.4* 8.1* 7.5* 8.0* 7.9*  MG 2.1  --   --   --   --   --    Liver Function Tests:  Recent Labs Lab 03/30/15 1644  AST 20  ALT 19  ALKPHOS 83  BILITOT 0.4  PROT 7.1  ALBUMIN 2.7*    Recent Labs Lab 03/30/15 1644  LIPASE 49   No results for input(s): AMMONIA in the last 168 hours. CBC:  Recent  Labs Lab 03/31/15 0328 04/01/15 0700 04/02/15 0415 04/04/15 0050 04/06/15 0704  WBC 12.6* 4.3 3.4* 4.1 4.0  HGB 12.8 10.4* 9.4* 9.3* 9.8*  HCT 39.3 31.1* 27.8* 26.9* 28.8*  MCV 96.3 96.3 93.9 91.5 93.8  PLT 278 144* 153 127* 99*   Cardiac Enzymes: No results for input(s): CKTOTAL, CKMB, CKMBINDEX, TROPONINI in the last 168 hours. BNP: BNP (last 3 results) No results for input(s): BNP in the last 8760 hours.  ProBNP (last 3 results) No results for input(s): PROBNP in the last 8760 hours.  CBG:  Recent Labs Lab 04/05/15 0834 04/05/15 1149 04/05/15 1711 04/05/15 2016 04/06/15 0730  GLUCAP 140* 263* 287* 256* 143*    Signed:  Velvet Bathe MD.  [facility] 04/06/2015, 10:15 AM

## 2015-04-18 ENCOUNTER — Other Ambulatory Visit: Payer: Self-pay | Admitting: Neurology

## 2015-04-19 ENCOUNTER — Other Ambulatory Visit: Payer: Self-pay | Admitting: Neurology

## 2015-05-17 ENCOUNTER — Encounter (HOSPITAL_COMMUNITY): Payer: Self-pay | Admitting: *Deleted

## 2015-05-17 ENCOUNTER — Inpatient Hospital Stay (HOSPITAL_COMMUNITY)
Admission: EM | Admit: 2015-05-17 | Discharge: 2015-05-23 | DRG: 682 | Disposition: A | Discharge status: Hospice | Payer: Medicare Other | Attending: Internal Medicine | Admitting: Internal Medicine

## 2015-05-17 ENCOUNTER — Emergency Department (HOSPITAL_COMMUNITY): Payer: Medicare Other

## 2015-05-17 DIAGNOSIS — E2740 Unspecified adrenocortical insufficiency: Secondary | ICD-10-CM | POA: Diagnosis present

## 2015-05-17 DIAGNOSIS — J440 Chronic obstructive pulmonary disease with acute lower respiratory infection: Secondary | ICD-10-CM | POA: Diagnosis present

## 2015-05-17 DIAGNOSIS — R197 Diarrhea, unspecified: Secondary | ICD-10-CM

## 2015-05-17 DIAGNOSIS — Y95 Nosocomial condition: Secondary | ICD-10-CM | POA: Diagnosis present

## 2015-05-17 DIAGNOSIS — E43 Unspecified severe protein-calorie malnutrition: Secondary | ICD-10-CM | POA: Diagnosis present

## 2015-05-17 DIAGNOSIS — F1120 Opioid dependence, uncomplicated: Secondary | ICD-10-CM | POA: Diagnosis present

## 2015-05-17 DIAGNOSIS — N179 Acute kidney failure, unspecified: Secondary | ICD-10-CM | POA: Diagnosis present

## 2015-05-17 DIAGNOSIS — F02818 Dementia in other diseases classified elsewhere, unspecified severity, with other behavioral disturbance: Secondary | ICD-10-CM | POA: Insufficient documentation

## 2015-05-17 DIAGNOSIS — E1022 Type 1 diabetes mellitus with diabetic chronic kidney disease: Secondary | ICD-10-CM | POA: Diagnosis present

## 2015-05-17 DIAGNOSIS — G8929 Other chronic pain: Secondary | ICD-10-CM | POA: Diagnosis present

## 2015-05-17 DIAGNOSIS — Z681 Body mass index (BMI) 19 or less, adult: Secondary | ICD-10-CM

## 2015-05-17 DIAGNOSIS — E1049 Type 1 diabetes mellitus with other diabetic neurological complication: Secondary | ICD-10-CM | POA: Diagnosis present

## 2015-05-17 DIAGNOSIS — N189 Chronic kidney disease, unspecified: Secondary | ICD-10-CM | POA: Diagnosis present

## 2015-05-17 DIAGNOSIS — R112 Nausea with vomiting, unspecified: Secondary | ICD-10-CM | POA: Diagnosis not present

## 2015-05-17 DIAGNOSIS — Z794 Long term (current) use of insulin: Secondary | ICD-10-CM

## 2015-05-17 DIAGNOSIS — E872 Acidosis, unspecified: Secondary | ICD-10-CM | POA: Insufficient documentation

## 2015-05-17 DIAGNOSIS — J15211 Pneumonia due to Methicillin susceptible Staphylococcus aureus: Secondary | ICD-10-CM | POA: Diagnosis not present

## 2015-05-17 DIAGNOSIS — A4901 Methicillin susceptible Staphylococcus aureus infection, unspecified site: Secondary | ICD-10-CM | POA: Diagnosis present

## 2015-05-17 DIAGNOSIS — M797 Fibromyalgia: Secondary | ICD-10-CM | POA: Diagnosis present

## 2015-05-17 DIAGNOSIS — Z888 Allergy status to other drugs, medicaments and biological substances status: Secondary | ICD-10-CM

## 2015-05-17 DIAGNOSIS — K2270 Barrett's esophagus without dysplasia: Secondary | ICD-10-CM | POA: Diagnosis present

## 2015-05-17 DIAGNOSIS — Z7189 Other specified counseling: Secondary | ICD-10-CM | POA: Insufficient documentation

## 2015-05-17 DIAGNOSIS — Z9114 Patient's other noncompliance with medication regimen: Secondary | ICD-10-CM

## 2015-05-17 DIAGNOSIS — K219 Gastro-esophageal reflux disease without esophagitis: Secondary | ICD-10-CM | POA: Diagnosis present

## 2015-05-17 DIAGNOSIS — F411 Generalized anxiety disorder: Secondary | ICD-10-CM | POA: Insufficient documentation

## 2015-05-17 DIAGNOSIS — E860 Dehydration: Secondary | ICD-10-CM | POA: Diagnosis present

## 2015-05-17 DIAGNOSIS — Z7952 Long term (current) use of systemic steroids: Secondary | ICD-10-CM

## 2015-05-17 DIAGNOSIS — E870 Hyperosmolality and hypernatremia: Secondary | ICD-10-CM | POA: Diagnosis present

## 2015-05-17 DIAGNOSIS — Z825 Family history of asthma and other chronic lower respiratory diseases: Secondary | ICD-10-CM

## 2015-05-17 DIAGNOSIS — F419 Anxiety disorder, unspecified: Secondary | ICD-10-CM | POA: Diagnosis present

## 2015-05-17 DIAGNOSIS — F0281 Dementia in other diseases classified elsewhere with behavioral disturbance: Secondary | ICD-10-CM | POA: Insufficient documentation

## 2015-05-17 DIAGNOSIS — E10649 Type 1 diabetes mellitus with hypoglycemia without coma: Secondary | ICD-10-CM | POA: Diagnosis present

## 2015-05-17 DIAGNOSIS — R64 Cachexia: Secondary | ICD-10-CM | POA: Diagnosis present

## 2015-05-17 DIAGNOSIS — M810 Age-related osteoporosis without current pathological fracture: Secondary | ICD-10-CM | POA: Diagnosis present

## 2015-05-17 DIAGNOSIS — K8689 Other specified diseases of pancreas: Secondary | ICD-10-CM | POA: Diagnosis present

## 2015-05-17 DIAGNOSIS — F068 Other specified mental disorders due to known physiological condition: Secondary | ICD-10-CM | POA: Diagnosis present

## 2015-05-17 DIAGNOSIS — I129 Hypertensive chronic kidney disease with stage 1 through stage 4 chronic kidney disease, or unspecified chronic kidney disease: Secondary | ICD-10-CM | POA: Diagnosis present

## 2015-05-17 DIAGNOSIS — Z806 Family history of leukemia: Secondary | ICD-10-CM

## 2015-05-17 DIAGNOSIS — Z8249 Family history of ischemic heart disease and other diseases of the circulatory system: Secondary | ICD-10-CM

## 2015-05-17 DIAGNOSIS — Z881 Allergy status to other antibiotic agents status: Secondary | ICD-10-CM

## 2015-05-17 DIAGNOSIS — Z853 Personal history of malignant neoplasm of breast: Secondary | ICD-10-CM

## 2015-05-17 DIAGNOSIS — Z820 Family history of epilepsy and other diseases of the nervous system: Secondary | ICD-10-CM

## 2015-05-17 DIAGNOSIS — Z79899 Other long term (current) drug therapy: Secondary | ICD-10-CM

## 2015-05-17 DIAGNOSIS — Z66 Do not resuscitate: Secondary | ICD-10-CM | POA: Diagnosis present

## 2015-05-17 DIAGNOSIS — Z882 Allergy status to sulfonamides status: Secondary | ICD-10-CM

## 2015-05-17 DIAGNOSIS — R1310 Dysphagia, unspecified: Secondary | ICD-10-CM | POA: Insufficient documentation

## 2015-05-17 DIAGNOSIS — Z515 Encounter for palliative care: Secondary | ICD-10-CM | POA: Insufficient documentation

## 2015-05-17 DIAGNOSIS — F17210 Nicotine dependence, cigarettes, uncomplicated: Secondary | ICD-10-CM | POA: Diagnosis present

## 2015-05-17 DIAGNOSIS — G3183 Dementia with Lewy bodies: Secondary | ICD-10-CM | POA: Insufficient documentation

## 2015-05-17 DIAGNOSIS — E876 Hypokalemia: Secondary | ICD-10-CM | POA: Diagnosis present

## 2015-05-17 DIAGNOSIS — J189 Pneumonia, unspecified organism: Secondary | ICD-10-CM | POA: Diagnosis present

## 2015-05-17 DIAGNOSIS — Z833 Family history of diabetes mellitus: Secondary | ICD-10-CM

## 2015-05-17 DIAGNOSIS — K861 Other chronic pancreatitis: Secondary | ICD-10-CM | POA: Diagnosis present

## 2015-05-17 DIAGNOSIS — F329 Major depressive disorder, single episode, unspecified: Secondary | ICD-10-CM | POA: Diagnosis present

## 2015-05-17 DIAGNOSIS — R1031 Right lower quadrant pain: Secondary | ICD-10-CM

## 2015-05-17 DIAGNOSIS — Z8701 Personal history of pneumonia (recurrent): Secondary | ICD-10-CM

## 2015-05-17 DIAGNOSIS — F0280 Dementia in other diseases classified elsewhere without behavioral disturbance: Secondary | ICD-10-CM | POA: Insufficient documentation

## 2015-05-17 LAB — I-STAT CHEM 8, ED
BUN: 33 mg/dL — ABNORMAL HIGH (ref 6–20)
CREATININE: 2.8 mg/dL — AB (ref 0.44–1.00)
Calcium, Ion: 1.08 mmol/L — ABNORMAL LOW (ref 1.13–1.30)
Chloride: 117 mmol/L — ABNORMAL HIGH (ref 101–111)
GLUCOSE: 164 mg/dL — AB (ref 65–99)
HCT: 37 % (ref 36.0–46.0)
HEMOGLOBIN: 12.6 g/dL (ref 12.0–15.0)
POTASSIUM: 2.1 mmol/L — AB (ref 3.5–5.1)
Sodium: 145 mmol/L (ref 135–145)
TCO2: 13 mmol/L (ref 0–100)

## 2015-05-17 LAB — URINALYSIS, ROUTINE W REFLEX MICROSCOPIC
BILIRUBIN URINE: NEGATIVE
Glucose, UA: NEGATIVE mg/dL
Hgb urine dipstick: NEGATIVE
KETONES UR: NEGATIVE mg/dL
NITRITE: NEGATIVE
PROTEIN: 30 mg/dL — AB
Specific Gravity, Urine: 1.014 (ref 1.005–1.030)
pH: 6 (ref 5.0–8.0)

## 2015-05-17 LAB — COMPREHENSIVE METABOLIC PANEL
ALK PHOS: 110 U/L (ref 38–126)
ALT: 16 U/L (ref 14–54)
AST: 17 U/L (ref 15–41)
Albumin: 2.5 g/dL — ABNORMAL LOW (ref 3.5–5.0)
Anion gap: 17 — ABNORMAL HIGH (ref 5–15)
BILIRUBIN TOTAL: 0.6 mg/dL (ref 0.3–1.2)
BUN: 37 mg/dL — ABNORMAL HIGH (ref 6–20)
CALCIUM: 8.8 mg/dL — AB (ref 8.9–10.3)
CHLORIDE: 114 mmol/L — AB (ref 101–111)
CO2: 14 mmol/L — ABNORMAL LOW (ref 22–32)
CREATININE: 2.71 mg/dL — AB (ref 0.44–1.00)
GFR, EST AFRICAN AMERICAN: 20 mL/min — AB (ref >60)
GFR, EST NON AFRICAN AMERICAN: 17 mL/min — AB (ref >60)
Glucose, Bld: 173 mg/dL — ABNORMAL HIGH (ref 65–99)
Potassium: 2.1 mmol/L — CL (ref 3.5–5.1)
Sodium: 145 mmol/L (ref 135–145)
TOTAL PROTEIN: 5.9 g/dL — AB (ref 6.5–8.1)

## 2015-05-17 LAB — MAGNESIUM: MAGNESIUM: 2 mg/dL (ref 1.7–2.4)

## 2015-05-17 LAB — URINE MICROSCOPIC-ADD ON

## 2015-05-17 LAB — LIPASE, BLOOD: LIPASE: 24 U/L (ref 11–51)

## 2015-05-17 MED ORDER — VANCOMYCIN HCL IN DEXTROSE 1-5 GM/200ML-% IV SOLN
1000.0000 mg | Freq: Once | INTRAVENOUS | Status: AC
  Administered 2015-05-17: 1000 mg via INTRAVENOUS
  Filled 2015-05-17: qty 200

## 2015-05-17 MED ORDER — POTASSIUM CHLORIDE 10 MEQ/100ML IV SOLN
10.0000 meq | INTRAVENOUS | Status: AC
  Administered 2015-05-17 – 2015-05-18 (×6): 10 meq via INTRAVENOUS
  Filled 2015-05-17 (×4): qty 100

## 2015-05-17 MED ORDER — SODIUM CHLORIDE 0.9 % IV BOLUS (SEPSIS)
1000.0000 mL | Freq: Once | INTRAVENOUS | Status: AC
  Administered 2015-05-17: 1000 mL via INTRAVENOUS

## 2015-05-17 MED ORDER — DEXTROSE 5 % IV SOLN
1.0000 g | Freq: Once | INTRAVENOUS | Status: AC
  Administered 2015-05-17: 1 g via INTRAVENOUS
  Filled 2015-05-17: qty 1

## 2015-05-17 NOTE — ED Notes (Signed)
Attempt to I&O cath by NT.  No urine at this time.  Will give fluids and attempt again

## 2015-05-17 NOTE — ED Notes (Signed)
Pt has not been herself and has had generalized weakness for one week per husband.  Urine output decreased and urine dark.  Pt has also been having nausea and diarrhea for a week.  No IV by EMS.  Pt is oriented at baseline but more sleepy than usual.  12lead wnl

## 2015-05-17 NOTE — ED Provider Notes (Signed)
CSN: [id number]     Arrival date & time 05/17/15  1600 History   First MD Initiated Contact with Patient 05/17/15 1612     Chief Complaint  Patient presents with  . Illness     Patient is a 70 y.o. female presenting with general illness. The history is provided by the EMS personnel and a caregiver. No language interpreter was used.  Illness  Lindsey Gomez is a 70 y.o. female who presents to the Emergency Department complaining of weakness.  Level V caveat due to dementia.  Per EMS report pt with increased weakness for last week, diarrhea, decreased oral intake, decreased urinary output.    Past Medical History  Diagnosis Date  . Depression   . HTN (hypertension)   . Chronic pain   . Fibromyalgia   . Migraines   . History of breast cancer     Right  . Chronic pancreatitis ([place])   . Vitamin D deficiency   . GERD (gastroesophageal reflux disease)   . Finger dislocation 2009    L 3rd  . COPD (chronic obstructive pulmonary disease) ([place])   . Anemia   . Fractured sternum 11/2008  . Osteoporosis     Reclast too expensive  . Barrett's esophagus   . Chronic fatigue   . Diverticulosis   . Opioid dependence ([place])   . 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 Abnormality of gait 10/12/2013  . Anxiety   . Depression   . Type II or unspecified type diabetes mellitus without mention of complication, not stated as uncontrolled   . Pancreatitis   . Hypoglycemia   . Lung nodules   . Chronic kidney disease   . Parkinson disease ([place])   . Parkinson's disease ([place]) 06/15/2014  . Convulsions/seizures ([place]) 10/12/2013    Possible benzodiazepine withdrawal seizure, x1-no further seizure activity   . Cancer ([place])     HX OF BREAST CANCER -chemo, radiation-right   . Malnourished [facility])    Past Surgical History  Procedure Laterality Date  . Nasal sinus surgery    . Pancreatectomy      40%  . Cholecystectomy    . Appendectomy    . Abdominal hysterectomy    . Lobectomy Left 2005    granulomatous lesion.    . Breast lumpectomy Left   . Ercp N/A 10/05/2013    Procedure: ENDOSCOPIC RETROGRADE CHOLANGIOPANCREATOGRAPHY (ERCP);  Surgeon: Ladene Artist, MD;  Location: [facility] ENDOSCOPY;  Service: Endoscopy;  Laterality: N/A;  . Cataract extraction Bilateral   . Botox injection      recent botox injection 12-23-13 injection for Migraines  . Cataract extraction, bilateral Bilateral     bilateral  . Eye surgery      1 eye "membrane surgery"  . Ercp N/A 01/07/2014    Procedure: ENDOSCOPIC RETROGRADE CHOLANGIOPANCREATOGRAPHY (ERCP);  Surgeon: Inda Castle, MD;  Location: [facility] ENDOSCOPY;  Service: Endoscopy;  Laterality: N/A;  . Spyglass cholangioscopy N/A 01/07/2014    Procedure: [id number] CHOLANGIOSCOPY;  Surgeon: Inda Castle, MD;  Location: [facility] ENDOSCOPY;  Service: Endoscopy;  Laterality: N/A;  . Colonoscopy    . Upper gastrointestinal endoscopy     Family History  Problem Relation Age of Onset  . COPD Father   . Heart disease Father   . CAD Father   . Cancer Father     "blood"  . Leukemia Mother   . Diabetes Maternal Uncle   . Dementia Sister   . Alzheimer's disease Sister   . Heart attack  Brother   . Colon cancer Neg Hx   . Colon polyps Neg Hx   . Esophageal cancer Neg Hx    Social History  Substance Use Topics  . Smoking status: Current Every Day Smoker -- 1.00 packs/day for 40 years    Types: Cigarettes    Last Attempt to Quit: 01/25/2014  . Smokeless tobacco: Never Used  . Alcohol Use: No   OB History    No data available     Review of Systems  All other systems reviewed and are negative.     Allergies  Milnacipran; Rizatriptan benzoate; Gabapentin; Moxifloxacin; Sulfonamide derivatives; and Venlafaxine  Home Medications   Prior to Admission medications   Medication Sig Start Date End Date Taking? Authorizing Provider  atenolol (TENORMIN) 100 MG tablet Take 1 tablet (100 mg total) by mouth every morning. Patient taking differently: Take 50 mg by mouth at bedtime.   05/15/13  Yes Aleksei Plotnikov V, MD  atorvastatin (LIPITOR) 10 MG tablet Take 10 mg by mouth at bedtime.  06/03/14  Yes Historical Provider, MD  beta carotene w/minerals (OCUVITE) tablet Take 1 tablet by mouth 2 (two) times daily.   Yes Historical Provider, MD  escitalopram (LEXAPRO) 20 MG tablet Take 1 tablet (20 mg total) by mouth daily. 02/23/15 02/23/16 Yes Kathlee Nations, MD  ferrous sulfate 325 (65 FE) MG tablet Take 325 mg by mouth daily with breakfast.   Yes Historical Provider, MD  gabapentin (NEURONTIN) 100 MG capsule TAKE ONE CAPSULE BY MOUTH TWICE DAILY FOR 90 DAYS 01/17/15  Yes Historical Provider, MD  hydrocortisone (CORTEF) 10 MG tablet Take 10 mg by mouth 2 (two) times daily.  03/17/14  Yes Historical Provider, MD  hyoscyamine (LEVBID) 0.375 MG 12 hr tablet TAKE ONE TAB TWICE A DAY 12/13/14  Yes Inda Castle, MD  insulin aspart (NOVOLOG) 100 UNIT/ML FlexPen Inject 0-8 Units into the skin 3 (three) times daily with meals. 70-120 = 0 units, 121-150 = 1 unit, 151-200 = 2 units, 201-250 = 3 units, 251-300 = 5 units, 301-350 = 7 units. 01/13/13  Yes Renato Shin, MD  LEVEMIR FLEXTOUCH 100 UNIT/ML Pen Inject 11 Units into the skin at bedtime.  03/31/14  Yes Historical Provider, MD  LORazepam (ATIVAN) 1 MG tablet Take 1 tablet (1 mg total) by mouth 2 (two) times daily. 03/15/14  Yes Theodis Blaze, MD  Melatonin 10 MG TABS Take 1 tablet by mouth at bedtime.   Yes Historical Provider, MD  memantine (NAMENDA XR) 28 MG CP24 24 hr capsule Take 28 mg by mouth at bedtime.   Yes Historical Provider, MD  memantine (NAMENDA XR) 7 MG CP24 24 hr capsule Take 7 mg by mouth 2 (two) times daily.   Yes Historical Provider, MD  mometasone (NASONEX) 50 MCG/ACT nasal spray Place 2 sprays into both nostrils daily as needed (for congestion).    Yes Historical Provider, MD  omeprazole (PRILOSEC) 40 MG capsule Take 40 mg by mouth daily.   Yes Historical Provider, MD  ondansetron (ZOFRAN) 8 MG tablet TAKE 1 TABLET (8 MG  TOTAL) BY MOUTH 2 (TWO) TIMES DAILY. 05/04/15  Yes Historical Provider, MD  Potassium Chloride CR (MICRO-K) 8 MEQ CPCR capsule CR TAKE 1 CAPSULE BY MOUTH WITH FOOD TWICE A DAY FOR 30 DAYS. 05/12/15  Yes Historical Provider, MD  Probiotic Product (PROBIOTIC DAILY PO) Take 1 tablet by mouth daily.    Yes Historical Provider, MD  sertraline (ZOLOFT) 25 MG tablet Take 25 mg  by mouth daily.   Yes Historical Provider, MD  topiramate (TOPAMAX) 100 MG tablet TAKE 1 TABLET BY MOUTH TWICE DAILY 09/23/14  Yes Aleksei Plotnikov V, MD  vitamin B-12 (CYANOCOBALAMIN) 1000 MCG tablet Take 1,000 mcg by mouth daily.   Yes Historical Provider, MD  Vortioxetine HBr (TRINTELLIX) 20 MG TABS Take 20 mg by mouth at bedtime.   Yes Historical Provider, MD  zolmitriptan (ZOMIG) 5 MG tablet Take 1 tablet (5 mg total) by mouth as needed for migraine (max 2 tabs daily). 03/27/15  Yes Kathrynn Ducking, MD  ACCU-CHEK AVIVA PLUS test strip  07/21/14   Historical Provider, MD  acetaminophen (TYLENOL) 500 MG tablet Take 500 mg by mouth every 6 (six) hours as needed.    Historical Provider, MD  BD PEN NEEDLE NANO U/F 32G X 4 MM MISC  07/22/14   Historical Provider, MD  docusate sodium (COLACE) 100 MG capsule Take 100 mg by mouth daily as needed for mild constipation.    Historical Provider, MD  isometheptene-acetaminophen-dichloralphenazone (MIDRIN) 65-100-325 MG capsule TAKE 1 CAPSULE BY MOUTH AS NEEDED FOR HEADACHE, MAXIMUM 5 CAPSULES IN 12 HOUR FOR MIGRAINE. 8 IN 24 04/19/15   Kathrynn Ducking, MD  lipase/protease/amylase (CREON) 12000 UNITS CPEP capsule Take 1 capsule (12,000 Units total) by mouth 3 (three) times daily before meals. Patient not taking: Reported on 03/30/2015 10/19/14   Inda Castle, MD  pregabalin (LYRICA) 50 MG capsule Take 1 capsule (50 mg total) by mouth 2 (two) times daily. Patient not taking: Reported on 05/17/2015 06/15/14   Kathrynn Ducking, MD  triamcinolone (KENALOG) 0.1 % ointment Apply 1 application topically 2  (two) times daily as needed (itching).     Historical Provider, MD   BP 164/84 mmHg  Pulse 96  Temp(Src) 97.6 F (36.4 C) (Oral)  Resp 18  SpO2 100% Physical Exam  Constitutional: She appears well-developed.  Cachectic, frail  HENT:  Head: Normocephalic and atraumatic.  Cardiovascular: Normal rate and regular rhythm.   No murmur heard. Pulmonary/Chest: Effort normal and breath sounds normal. No respiratory distress.  Abdominal: Soft. There is no tenderness. There is no rebound and no guarding.  Musculoskeletal: She exhibits no edema or tenderness.  Neurological: She is alert.  Disoriented to time, generalized weakness, confused  Skin: Skin is warm and dry.  Psychiatric: She has a normal mood and affect. Her behavior is normal.  Nursing note and vitals reviewed.   ED Course  Procedures (including critical care time) Labs Review Labs Reviewed  URINALYSIS, ROUTINE W REFLEX MICROSCOPIC (NOT AT [facility]) - Abnormal; Notable for the following:    Protein, ur 30 (*)    Leukocytes, UA TRACE (*)    All other components within normal limits  CBC WITH DIFFERENTIAL/PLATELET - Abnormal; Notable for the following:    WBC 11.4 (*)    RBC 3.77 (*)    HCT 34.4 (*)    RDW 15.6 (*)    Neutro Abs 8.7 (*)    All other components within normal limits  URINE MICROSCOPIC-ADD ON - Abnormal; Notable for the following:    Squamous Epithelial / LPF 0-5 (*)    Bacteria, UA FEW (*)    All other components within normal limits  COMPREHENSIVE METABOLIC PANEL - Abnormal; Notable for the following:    Potassium 2.1 (*)    Chloride 114 (*)    CO2 14 (*)    Glucose, Bld 173 (*)    BUN 37 (*)    Creatinine,  Ser 2.71 (*)    Calcium 8.8 (*)    Total Protein 5.9 (*)    Albumin 2.5 (*)    GFR calc non Af Amer 17 (*)    GFR calc Af Amer 20 (*)    Anion gap 17 (*)    All other components within normal limits  I-STAT CHEM 8, ED - Abnormal; Notable for the following:    Potassium 2.1 (*)    Chloride 117  (*)    BUN 33 (*)    Creatinine, Ser 2.80 (*)    Glucose, Bld 164 (*)    Calcium, Ion 1.08 (*)    All other components within normal limits  URINE CULTURE  LIPASE, BLOOD  MAGNESIUM    Imaging Review Dg Chest 2 View  05/17/2015  CLINICAL DATA:  Nausea, diarrhea, and decreased urine output for 1 week. Breast carcinoma. EXAM: CHEST  2 VIEW COMPARISON:  03/31/2015 FINDINGS: Severe emphysema is again demonstrated. New left lower lobe airspace disease is seen, suspicious for pneumonia. There is a persistent 3.5 cm nodular opacity in the right perihilar region, suspicious for neoplasm. Pleural-parenchymal scarring also seen in this region. Heart size is within normal limits. Tiny left pleural effusion versus pleural thickening again noted. IMPRESSION: New left lower lobe airspace disease, suspicious for pneumonia. Severe emphysema. Persistent 3.5 cm nodular opacity in right perihilar region, suspicious for neoplasm. Chest CT with contrast is suggested for further evaluation. Electronically Signed   By: Earle Gell M.D.   On: 05/17/2015 17:58   I have personally reviewed and evaluated these images and lab results as part of my medical decision-making.   EKG Interpretation   Date/Time:  Tuesday May 17 2015 17:26:57 EST Ventricular Rate:  76 PR Interval:  136 QRS Duration: 71 QT Interval:  373 QTC Calculation: 419 R Axis:   72 Text Interpretation:  Sinus rhythm Repol abnrm suggests ischemia, diffuse  leads Confirmed by Hazle Coca [phone]) on 05/17/2015 5:57:06 PM      Angiocath insertion Performed by: Quintella Reichert  Consent: Verbal consent obtained. Risks and benefits: risks, benefits and alternatives were discussed Time out: Immediately prior to procedure a "time out" was called to verify the correct patient, procedure, equipment, support staff and site/side marked as required.  Preparation: Patient was prepped and draped in the usual sterile fashion.  Vein Location: right EJ  Gauge:  20  Normal blood return and flush without difficulty Patient tolerance: Patient tolerated the procedure well with no immediate complications.     MDM   Final diagnoses:  None    Patient here for confusion, weakness, vomiting, diarrhea, decreased oral intake. She is chronically ill appearing on examination. BMP with hypokalemia and acute kidney injury. Providing IV fluids replacing potassium. Chest x-ray is concerning for pneumonia, we will treat for HCAP Given her recent hospitalization. Plan to admit for further treatment.    Quintella Reichert, MD 05/18/15 [phone]

## 2015-05-17 NOTE — ED Notes (Signed)
IV team consult requested.  Several attempts to establish a site.  Patient removed established line on her own.

## 2015-05-17 NOTE — Progress Notes (Signed)
Pharmacy Antibiotic Note  Lindsey Gomez is a 70 y.o. female admitted on 05/17/2015 with pneumonia.  Chest Xray suspicious for pneumonia.  Pharmacy has been consulted for Vancomycin dosing.  Plan:  Cefepime 1gm x 1 per MD  Vancomycin 1gm IV x 1   Height/Weight ordered to be updated in EPIC.  Once documented, can order vancomycin regimen  [name] continuation of Cefepime upon admission     Temp (24hrs), Avg:98.9 F (37.2 C), Min:98.8 F (37.1 C), Max:98.9 F (37.2 C)   Recent Labs Lab 05/17/15 2110  CREATININE 2.80*    CrCl cannot be calculated (Unknown ideal weight.).    Allergies  Allergen Reactions  . Milnacipran Swelling and Other (See Comments)    Headache and hand swelling   . Rizatriptan Benzoate Nausea And Vomiting  . Gabapentin Other (See Comments)    Weak muscles  . Moxifloxacin Other (See Comments)    Unknown  . Sulfonamide Derivatives Rash  . Venlafaxine Other (See Comments)    Unknown    Antimicrobials this admission: 3/7 Vanc >>   3/7 Cefepime >>    Dose adjustments this admission:  Goal: Vancomycin Trough = 15-20 mcg/ml  Microbiology results: 3/7 UCx:     Thank you for allowing pharmacy to be a part of this patient's care.  Everette Rank, PharmD 05/17/2015 9:43 PM

## 2015-05-17 NOTE — ED Notes (Signed)
Iv removed

## 2015-05-17 NOTE — ED Notes (Signed)
Dr. Reese at bedside.

## 2015-05-17 NOTE — ED Notes (Signed)
Bed: [id number] Expected date:  Expected time:  Means of arrival:  Comments: EMS- lethargic, dark urine

## 2015-05-17 NOTE — ED Notes (Signed)
IV team at bedside 

## 2015-05-17 NOTE — ED Notes (Signed)
Blood draw partially successful- only light green obtained.  Restricted Extremity

## 2015-05-17 NOTE — ED Notes (Signed)
NS was held to allow for blood draw.  I attempted blood draw x2, phlebotomy to come try next

## 2015-05-17 NOTE — ED Notes (Signed)
Lab to add Magnesium onto blood already sent.

## 2015-05-18 ENCOUNTER — Encounter (HOSPITAL_COMMUNITY): Payer: Self-pay | Admitting: *Deleted

## 2015-05-18 DIAGNOSIS — I129 Hypertensive chronic kidney disease with stage 1 through stage 4 chronic kidney disease, or unspecified chronic kidney disease: Secondary | ICD-10-CM | POA: Diagnosis present

## 2015-05-18 DIAGNOSIS — F419 Anxiety disorder, unspecified: Secondary | ICD-10-CM | POA: Diagnosis present

## 2015-05-18 DIAGNOSIS — Z8249 Family history of ischemic heart disease and other diseases of the circulatory system: Secondary | ICD-10-CM | POA: Diagnosis not present

## 2015-05-18 DIAGNOSIS — Z820 Family history of epilepsy and other diseases of the nervous system: Secondary | ICD-10-CM | POA: Diagnosis not present

## 2015-05-18 DIAGNOSIS — G8929 Other chronic pain: Secondary | ICD-10-CM

## 2015-05-18 DIAGNOSIS — F1129 Opioid dependence with unspecified opioid-induced disorder: Secondary | ICD-10-CM

## 2015-05-18 DIAGNOSIS — R1031 Right lower quadrant pain: Secondary | ICD-10-CM

## 2015-05-18 DIAGNOSIS — E870 Hyperosmolality and hypernatremia: Secondary | ICD-10-CM | POA: Diagnosis present

## 2015-05-18 DIAGNOSIS — G3183 Dementia with Lewy bodies: Secondary | ICD-10-CM | POA: Diagnosis present

## 2015-05-18 DIAGNOSIS — K219 Gastro-esophageal reflux disease without esophagitis: Secondary | ICD-10-CM | POA: Diagnosis present

## 2015-05-18 DIAGNOSIS — K8689 Other specified diseases of pancreas: Secondary | ICD-10-CM | POA: Diagnosis present

## 2015-05-18 DIAGNOSIS — J189 Pneumonia, unspecified organism: Secondary | ICD-10-CM | POA: Diagnosis not present

## 2015-05-18 DIAGNOSIS — Z79899 Other long term (current) drug therapy: Secondary | ICD-10-CM | POA: Diagnosis not present

## 2015-05-18 DIAGNOSIS — E1022 Type 1 diabetes mellitus with diabetic chronic kidney disease: Secondary | ICD-10-CM | POA: Diagnosis present

## 2015-05-18 DIAGNOSIS — Z853 Personal history of malignant neoplasm of breast: Secondary | ICD-10-CM | POA: Diagnosis not present

## 2015-05-18 DIAGNOSIS — F1120 Opioid dependence, uncomplicated: Secondary | ICD-10-CM | POA: Diagnosis present

## 2015-05-18 DIAGNOSIS — N189 Chronic kidney disease, unspecified: Secondary | ICD-10-CM | POA: Diagnosis present

## 2015-05-18 DIAGNOSIS — N179 Acute kidney failure, unspecified: Secondary | ICD-10-CM | POA: Diagnosis present

## 2015-05-18 DIAGNOSIS — M797 Fibromyalgia: Secondary | ICD-10-CM | POA: Diagnosis present

## 2015-05-18 DIAGNOSIS — E1040 Type 1 diabetes mellitus with diabetic neuropathy, unspecified: Secondary | ICD-10-CM

## 2015-05-18 DIAGNOSIS — Z681 Body mass index (BMI) 19 or less, adult: Secondary | ICD-10-CM | POA: Diagnosis not present

## 2015-05-18 DIAGNOSIS — J15211 Pneumonia due to Methicillin susceptible Staphylococcus aureus: Secondary | ICD-10-CM | POA: Diagnosis not present

## 2015-05-18 DIAGNOSIS — R112 Nausea with vomiting, unspecified: Secondary | ICD-10-CM | POA: Diagnosis present

## 2015-05-18 DIAGNOSIS — Z888 Allergy status to other drugs, medicaments and biological substances status: Secondary | ICD-10-CM | POA: Diagnosis not present

## 2015-05-18 DIAGNOSIS — E876 Hypokalemia: Secondary | ICD-10-CM | POA: Diagnosis present

## 2015-05-18 DIAGNOSIS — Z825 Family history of asthma and other chronic lower respiratory diseases: Secondary | ICD-10-CM | POA: Diagnosis not present

## 2015-05-18 DIAGNOSIS — Z9114 Patient's other noncompliance with medication regimen: Secondary | ICD-10-CM | POA: Diagnosis not present

## 2015-05-18 DIAGNOSIS — R197 Diarrhea, unspecified: Secondary | ICD-10-CM

## 2015-05-18 DIAGNOSIS — Z794 Long term (current) use of insulin: Secondary | ICD-10-CM | POA: Diagnosis not present

## 2015-05-18 DIAGNOSIS — Z7189 Other specified counseling: Secondary | ICD-10-CM | POA: Diagnosis not present

## 2015-05-18 DIAGNOSIS — E2740 Unspecified adrenocortical insufficiency: Secondary | ICD-10-CM | POA: Diagnosis present

## 2015-05-18 DIAGNOSIS — E860 Dehydration: Secondary | ICD-10-CM | POA: Diagnosis present

## 2015-05-18 DIAGNOSIS — F17210 Nicotine dependence, cigarettes, uncomplicated: Secondary | ICD-10-CM | POA: Diagnosis present

## 2015-05-18 DIAGNOSIS — Z833 Family history of diabetes mellitus: Secondary | ICD-10-CM | POA: Diagnosis not present

## 2015-05-18 DIAGNOSIS — M810 Age-related osteoporosis without current pathological fracture: Secondary | ICD-10-CM | POA: Diagnosis present

## 2015-05-18 DIAGNOSIS — Z882 Allergy status to sulfonamides status: Secondary | ICD-10-CM | POA: Diagnosis not present

## 2015-05-18 DIAGNOSIS — Z806 Family history of leukemia: Secondary | ICD-10-CM | POA: Diagnosis not present

## 2015-05-18 DIAGNOSIS — Z7952 Long term (current) use of systemic steroids: Secondary | ICD-10-CM | POA: Diagnosis not present

## 2015-05-18 DIAGNOSIS — E872 Acidosis: Secondary | ICD-10-CM | POA: Diagnosis present

## 2015-05-18 DIAGNOSIS — Z515 Encounter for palliative care: Secondary | ICD-10-CM | POA: Diagnosis present

## 2015-05-18 DIAGNOSIS — K2270 Barrett's esophagus without dysplasia: Secondary | ICD-10-CM | POA: Diagnosis present

## 2015-05-18 DIAGNOSIS — F068 Other specified mental disorders due to known physiological condition: Secondary | ICD-10-CM | POA: Diagnosis present

## 2015-05-18 DIAGNOSIS — E1049 Type 1 diabetes mellitus with other diabetic neurological complication: Secondary | ICD-10-CM | POA: Diagnosis present

## 2015-05-18 DIAGNOSIS — Y95 Nosocomial condition: Secondary | ICD-10-CM | POA: Diagnosis present

## 2015-05-18 DIAGNOSIS — A4901 Methicillin susceptible Staphylococcus aureus infection, unspecified site: Secondary | ICD-10-CM | POA: Diagnosis present

## 2015-05-18 DIAGNOSIS — Z881 Allergy status to other antibiotic agents status: Secondary | ICD-10-CM | POA: Diagnosis not present

## 2015-05-18 DIAGNOSIS — Z66 Do not resuscitate: Secondary | ICD-10-CM | POA: Diagnosis present

## 2015-05-18 DIAGNOSIS — J440 Chronic obstructive pulmonary disease with acute lower respiratory infection: Secondary | ICD-10-CM | POA: Diagnosis present

## 2015-05-18 DIAGNOSIS — E43 Unspecified severe protein-calorie malnutrition: Secondary | ICD-10-CM | POA: Diagnosis present

## 2015-05-18 DIAGNOSIS — R64 Cachexia: Secondary | ICD-10-CM | POA: Diagnosis present

## 2015-05-18 DIAGNOSIS — F0280 Dementia in other diseases classified elsewhere without behavioral disturbance: Secondary | ICD-10-CM | POA: Diagnosis present

## 2015-05-18 DIAGNOSIS — E10649 Type 1 diabetes mellitus with hypoglycemia without coma: Secondary | ICD-10-CM | POA: Diagnosis present

## 2015-05-18 DIAGNOSIS — F411 Generalized anxiety disorder: Secondary | ICD-10-CM | POA: Diagnosis present

## 2015-05-18 DIAGNOSIS — F329 Major depressive disorder, single episode, unspecified: Secondary | ICD-10-CM | POA: Diagnosis present

## 2015-05-18 DIAGNOSIS — Z8701 Personal history of pneumonia (recurrent): Secondary | ICD-10-CM | POA: Diagnosis not present

## 2015-05-18 DIAGNOSIS — K861 Other chronic pancreatitis: Secondary | ICD-10-CM | POA: Diagnosis present

## 2015-05-18 LAB — CBC WITH DIFFERENTIAL/PLATELET
BASOS PCT: 0 %
Basophils Absolute: 0 10*3/uL (ref 0.0–0.1)
EOS ABS: 0 10*3/uL (ref 0.0–0.7)
Eosinophils Relative: 0 %
HCT: 34.4 % — ABNORMAL LOW (ref 36.0–46.0)
Hemoglobin: 12 g/dL (ref 12.0–15.0)
Lymphocytes Relative: 16 %
Lymphs Abs: 1.8 10*3/uL (ref 0.7–4.0)
MCH: 31.8 pg (ref 26.0–34.0)
MCHC: 34.9 g/dL (ref 30.0–36.0)
MCV: 91.2 fL (ref 78.0–100.0)
MONO ABS: 0.9 10*3/uL (ref 0.1–1.0)
Monocytes Relative: 8 %
NEUTROS PCT: 76 %
Neutro Abs: 8.7 10*3/uL — ABNORMAL HIGH (ref 1.7–7.7)
PLATELETS: 234 10*3/uL (ref 150–400)
RBC: 3.77 MIL/uL — AB (ref 3.87–5.11)
RDW: 15.6 % — ABNORMAL HIGH (ref 11.5–15.5)
WBC: 11.4 10*3/uL — AB (ref 4.0–10.5)

## 2015-05-18 LAB — CBC
HEMATOCRIT: 32.1 % — AB (ref 36.0–46.0)
Hemoglobin: 11.7 g/dL — ABNORMAL LOW (ref 12.0–15.0)
MCH: 32.7 pg (ref 26.0–34.0)
MCHC: 36.4 g/dL — ABNORMAL HIGH (ref 30.0–36.0)
MCV: 89.7 fL (ref 78.0–100.0)
PLATELETS: 149 10*3/uL — AB (ref 150–400)
RBC: 3.58 MIL/uL — ABNORMAL LOW (ref 3.87–5.11)
RDW: 15.3 % (ref 11.5–15.5)
WBC: 7.6 10*3/uL (ref 4.0–10.5)

## 2015-05-18 LAB — GLUCOSE, CAPILLARY
GLUCOSE-CAPILLARY: 41 mg/dL — AB (ref 65–99)
GLUCOSE-CAPILLARY: 114 mg/dL — AB (ref 65–99)
GLUCOSE-CAPILLARY: 29 mg/dL — AB (ref 65–99)
GLUCOSE-CAPILLARY: 141 mg/dL — AB (ref 65–99)
GLUCOSE-CAPILLARY: 88 mg/dL (ref 65–99)
Glucose-Capillary: 158 mg/dL — ABNORMAL HIGH (ref 65–99)
Glucose-Capillary: 31 mg/dL — CL (ref 65–99)
Glucose-Capillary: 47 mg/dL — ABNORMAL LOW (ref 65–99)
Glucose-Capillary: 75 mg/dL (ref 65–99)
Glucose-Capillary: 63 mg/dL — ABNORMAL LOW (ref 65–99)

## 2015-05-18 LAB — BASIC METABOLIC PANEL
Anion gap: 13 (ref 5–15)
BUN: 35 mg/dL — AB (ref 6–20)
CO2: 13 mmol/L — ABNORMAL LOW (ref 22–32)
CREATININE: 2.44 mg/dL — AB (ref 0.44–1.00)
Calcium: 7.9 mg/dL — ABNORMAL LOW (ref 8.9–10.3)
Chloride: 120 mmol/L — ABNORMAL HIGH (ref 101–111)
GFR calc Af Amer: 22 mL/min — ABNORMAL LOW (ref >60)
GFR, EST NON AFRICAN AMERICAN: 19 mL/min — AB (ref >60)
GLUCOSE: 187 mg/dL — AB (ref 65–99)
POTASSIUM: 2.1 mmol/L — AB (ref 3.5–5.1)
SODIUM: 146 mmol/L — AB (ref 135–145)

## 2015-05-18 LAB — BLOOD GAS, ARTERIAL
ACID-BASE DEFICIT: 13.9 mmol/L — AB (ref 0.0–2.0)
BICARBONATE: 9.7 meq/L — AB (ref 20.0–24.0)
DRAWN BY: 232811
FIO2: 0.21
O2 SAT: 95.4 %
PATIENT TEMPERATURE: 97.6
PO2 ART: 74.7 mmHg — AB (ref 80.0–100.0)
TCO2: 9 mmol/L (ref 0–100)
pCO2 arterial: 16.5 mmHg — CL (ref 35.0–45.0)
pH, Arterial: 7.382 (ref 7.350–7.450)

## 2015-05-18 LAB — EXPECTORATED SPUTUM ASSESSMENT W REFEX TO RESP CULTURE

## 2015-05-18 LAB — STREP PNEUMONIAE URINARY ANTIGEN: Strep Pneumo Urinary Antigen: NEGATIVE

## 2015-05-18 LAB — EXPECTORATED SPUTUM ASSESSMENT W GRAM STAIN, RFLX TO RESP C

## 2015-05-18 LAB — HIV ANTIBODY (ROUTINE TESTING W REFLEX): HIV SCREEN 4TH GENERATION: NONREACTIVE

## 2015-05-18 MED ORDER — ENSURE ENLIVE PO LIQD
237.0000 mL | Freq: Two times a day (BID) | ORAL | Status: DC
  Administered 2015-05-19 – 2015-05-23 (×5): 237 mL via ORAL

## 2015-05-18 MED ORDER — DEXTROSE 50 % IV SOLN: 25.0000 mL | Freq: Once | INTRAVENOUS | Status: AC

## 2015-05-18 MED ORDER — DOCUSATE SODIUM 100 MG PO CAPS: 100.0000 mg | ORAL_CAPSULE | Freq: Every day | ORAL | Status: DC | PRN

## 2015-05-18 MED ORDER — FLUTICASONE PROPIONATE 50 MCG/ACT NA SUSP
2.0000 | Freq: Every day | NASAL | Status: DC
Start: 2015-05-18 — End: 2015-05-23
  Administered 2015-05-18 – 2015-05-23 (×6): 2 via NASAL
  Filled 2015-05-18: qty 16

## 2015-05-18 MED ORDER — SODIUM CHLORIDE 0.9 % IV SOLN
INTRAVENOUS | Status: DC
  Administered 2015-05-18: 04:00:00 via INTRAVENOUS

## 2015-05-18 MED ORDER — TOPIRAMATE 100 MG PO TABS
100.0000 mg | ORAL_TABLET | Freq: Two times a day (BID) | ORAL | Status: DC
  Administered 2015-05-18 – 2015-05-19 (×3): 100 mg via ORAL
  Filled 2015-05-18 (×4): qty 1

## 2015-05-18 MED ORDER — ACETAMINOPHEN 500 MG PO TABS
500.0000 mg | ORAL_TABLET | Freq: Four times a day (QID) | ORAL | Status: DC | PRN
Start: 2015-05-18 — End: 2015-05-23
  Administered 2015-05-23: 500 mg via ORAL
  Filled 2015-05-18: qty 1

## 2015-05-18 MED ORDER — INSULIN DETEMIR 100 UNIT/ML ~~LOC~~ SOLN
11.0000 [IU] | Freq: Every day | SUBCUTANEOUS | Status: DC
  Administered 2015-05-18: 11 [IU] via SUBCUTANEOUS
  Filled 2015-05-18: qty 0.11

## 2015-05-18 MED ORDER — PANCRELIPASE (LIP-PROT-AMYL) 12000-38000 UNITS PO CPEP
12000.0000 [IU] | ORAL_CAPSULE | Freq: Three times a day (TID) | ORAL | Status: DC
  Administered 2015-05-18 – 2015-05-23 (×16): 12000 [IU] via ORAL
  Filled 2015-05-18 (×20): qty 1

## 2015-05-18 MED ORDER — LORAZEPAM 1 MG PO TABS
1.0000 mg | ORAL_TABLET | Freq: Two times a day (BID) | ORAL | Status: DC
  Administered 2015-05-18 – 2015-05-22 (×9): 1 mg via ORAL
  Filled 2015-05-18 (×9): qty 1

## 2015-05-18 MED ORDER — ATORVASTATIN CALCIUM 10 MG PO TABS
ORAL_TABLET | ORAL | Status: AC
  Filled 2015-05-18: qty 1

## 2015-05-18 MED ORDER — INSULIN ASPART 100 UNIT/ML ~~LOC~~ SOLN
0.0000 [IU] | SUBCUTANEOUS | Status: DC
  Administered 2015-05-19 (×2): 3 [IU] via SUBCUTANEOUS
  Administered 2015-05-19: 2 [IU] via SUBCUTANEOUS
  Administered 2015-05-19 (×2): 3 [IU] via SUBCUTANEOUS
  Administered 2015-05-20 (×2): 2 [IU] via SUBCUTANEOUS
  Administered 2015-05-20 (×2): 3 [IU] via SUBCUTANEOUS

## 2015-05-18 MED ORDER — MEMANTINE HCL ER 28 MG PO CP24
28.0000 mg | ORAL_CAPSULE | Freq: Every day | ORAL | Status: DC
  Filled 2015-05-18 (×2): qty 1

## 2015-05-18 MED ORDER — FERROUS SULFATE 325 (65 FE) MG PO TABS
325.0000 mg | ORAL_TABLET | Freq: Every day | ORAL | Status: DC
  Administered 2015-05-18 – 2015-05-21 (×4): 325 mg via ORAL
  Filled 2015-05-18 (×4): qty 1

## 2015-05-18 MED ORDER — ESCITALOPRAM OXALATE 20 MG PO TABS
20.0000 mg | ORAL_TABLET | Freq: Every day | ORAL | Status: DC
  Administered 2015-05-18: 20 mg via ORAL
  Filled 2015-05-18: qty 1

## 2015-05-18 MED ORDER — HYDROCORTISONE 10 MG PO TABS
10.0000 mg | ORAL_TABLET | Freq: Two times a day (BID) | ORAL | Status: DC
  Administered 2015-05-18: 10 mg via ORAL
  Filled 2015-05-18 (×3): qty 1

## 2015-05-18 MED ORDER — INSULIN ASPART 100 UNIT/ML ~~LOC~~ SOLN
0.0000 [IU] | SUBCUTANEOUS | Status: DC
  Administered 2015-05-18: 2 [IU] via SUBCUTANEOUS

## 2015-05-18 MED ORDER — POTASSIUM CHLORIDE 20 MEQ/15ML (10%) PO SOLN
40.0000 meq | Freq: Once | ORAL | Status: AC
  Administered 2015-05-18: 40 meq via ORAL
  Filled 2015-05-18: qty 30

## 2015-05-18 MED ORDER — MEMANTINE HCL ER 7 MG PO CP24
7.0000 mg | ORAL_CAPSULE | Freq: Two times a day (BID) | ORAL | Status: DC
  Administered 2015-05-18 (×2): 7 mg via ORAL
  Filled 2015-05-18 (×3): qty 1

## 2015-05-18 MED ORDER — HEPARIN SODIUM (PORCINE) 5000 UNIT/ML IJ SOLN
5000.0000 [IU] | Freq: Three times a day (TID) | INTRAMUSCULAR | Status: DC
  Administered 2015-05-18 – 2015-05-22 (×11): 5000 [IU] via SUBCUTANEOUS
  Filled 2015-05-18 (×11): qty 1

## 2015-05-18 MED ORDER — ONDANSETRON HCL 4 MG/2ML IJ SOLN
4.0000 mg | Freq: Four times a day (QID) | INTRAMUSCULAR | Status: DC | PRN
  Administered 2015-05-18 – 2015-05-23 (×2): 4 mg via INTRAVENOUS
  Filled 2015-05-18 (×2): qty 2

## 2015-05-18 MED ORDER — HYDROCORTISONE NA SUCCINATE PF 100 MG IJ SOLR
50.0000 mg | Freq: Three times a day (TID) | INTRAMUSCULAR | Status: DC
  Administered 2015-05-18 – 2015-05-22 (×11): 50 mg via INTRAVENOUS
  Filled 2015-05-18 (×11): qty 2

## 2015-05-18 MED ORDER — VANCOMYCIN HCL 500 MG IV SOLR
500.0000 mg | INTRAVENOUS | Status: DC
  Administered 2015-05-20: 500 mg via INTRAVENOUS
  Filled 2015-05-18: qty 500

## 2015-05-18 MED ORDER — ESCITALOPRAM OXALATE 10 MG PO TABS
10.0000 mg | ORAL_TABLET | Freq: Every day | ORAL | Status: DC
  Administered 2015-05-19 – 2015-05-23 (×5): 10 mg via ORAL
  Filled 2015-05-18 (×5): qty 1

## 2015-05-18 MED ORDER — VORTIOXETINE HBR 20 MG PO TABS
20.0000 mg | ORAL_TABLET | Freq: Every day | ORAL | Status: DC
  Administered 2015-05-18 – 2015-05-22 (×5): 20 mg via ORAL
  Filled 2015-05-18 (×8): qty 20

## 2015-05-18 MED ORDER — MEMANTINE HCL ER 14 MG PO CP24
14.0000 mg | ORAL_CAPSULE | Freq: Every day | ORAL | Status: DC
  Administered 2015-05-18 – 2015-05-22 (×5): 14 mg via ORAL
  Filled 2015-05-18 (×6): qty 1

## 2015-05-18 MED ORDER — KCL IN DEXTROSE-NACL 40-5-0.45 MEQ/L-%-% IV SOLN
INTRAVENOUS | Status: DC
  Administered 2015-05-18 – 2015-05-22 (×7): via INTRAVENOUS
  Filled 2015-05-18 (×11): qty 1000

## 2015-05-18 MED ORDER — ATENOLOL 50 MG PO TABS
ORAL_TABLET | ORAL | Status: AC
  Filled 2015-05-18: qty 1

## 2015-05-18 MED ORDER — POTASSIUM CHLORIDE CRYS ER 20 MEQ PO TBCR
40.0000 meq | EXTENDED_RELEASE_TABLET | Freq: Once | ORAL | Status: DC
  Filled 2015-05-18: qty 2

## 2015-05-18 MED ORDER — ONDANSETRON HCL 8 MG PO TABS: 8.0000 mg | ORAL_TABLET | Freq: Three times a day (TID) | ORAL | Status: DC | PRN

## 2015-05-18 MED ORDER — DEXTROSE 50 % IV SOLN
1.0000 | Freq: Once | INTRAVENOUS | Status: AC
  Administered 2015-05-18: 50 mL via INTRAVENOUS

## 2015-05-18 MED ORDER — ATORVASTATIN CALCIUM 10 MG PO TABS
10.0000 mg | ORAL_TABLET | Freq: Every day | ORAL | Status: DC
  Administered 2015-05-18 – 2015-05-21 (×4): 10 mg via ORAL
  Filled 2015-05-18 (×4): qty 1

## 2015-05-18 MED ORDER — GABAPENTIN 100 MG PO CAPS
100.0000 mg | ORAL_CAPSULE | Freq: Two times a day (BID) | ORAL | Status: DC
  Administered 2015-05-18 – 2015-05-23 (×11): 100 mg via ORAL
  Filled 2015-05-18 (×11): qty 1

## 2015-05-18 MED ORDER — PROCHLORPERAZINE EDISYLATE 5 MG/ML IJ SOLN: 10.0000 mg | Freq: Four times a day (QID) | INTRAMUSCULAR | Status: DC | PRN

## 2015-05-18 MED ORDER — SERTRALINE HCL 25 MG PO TABS
25.0000 mg | ORAL_TABLET | Freq: Every day | ORAL | Status: DC
  Administered 2015-05-18: 25 mg via ORAL
  Filled 2015-05-18: qty 1

## 2015-05-18 MED ORDER — ATENOLOL 50 MG PO TABS
50.0000 mg | ORAL_TABLET | Freq: Every day | ORAL | Status: DC
Start: 2015-05-18 — End: 2015-05-23
  Administered 2015-05-18 – 2015-05-22 (×5): 50 mg via ORAL
  Filled 2015-05-18 (×5): qty 1

## 2015-05-18 MED ORDER — DEXTROSE 5 % IV SOLN: 1.0000 g | Freq: Three times a day (TID) | INTRAVENOUS | Status: DC

## 2015-05-18 MED ORDER — PANTOPRAZOLE SODIUM 40 MG PO TBEC
80.0000 mg | DELAYED_RELEASE_TABLET | Freq: Every day | ORAL | Status: DC
  Administered 2015-05-18 – 2015-05-23 (×6): 80 mg via ORAL
  Filled 2015-05-18 (×6): qty 2

## 2015-05-18 MED ORDER — DEXTROSE 5 % IV SOLN
1.0000 g | INTRAVENOUS | Status: DC
  Administered 2015-05-18 – 2015-05-19 (×2): 1 g via INTRAVENOUS
  Filled 2015-05-18 (×2): qty 1

## 2015-05-18 MED ORDER — POTASSIUM CHLORIDE 10 MEQ/100ML IV SOLN
INTRAVENOUS | Status: AC
Start: 2015-05-18 — End: 2015-05-18
  Filled 2015-05-18: qty 100

## 2015-05-18 MED ORDER — DEXTROSE 50 % IV SOLN
INTRAVENOUS | Status: AC
  Filled 2015-05-18: qty 50

## 2015-05-18 MED ORDER — DEXTROSE 50 % IV SOLN
INTRAVENOUS | Status: AC
Start: 2015-05-18 — End: 2015-05-18
  Administered 2015-05-18: 50 mL
  Filled 2015-05-18: qty 50

## 2015-05-18 NOTE — Progress Notes (Signed)
Initial Nutrition Assessment  DOCUMENTATION CODES:   Severe malnutrition in context of acute illness/injury, Not applicable  INTERVENTION:  - Continue Ensure Enlive po BID, each supplement provides 350 kcal and 20 grams of protein - Encourage PO intakes of meals and supplements - RD will continue to monitor for needs  NUTRITION DIAGNOSIS:   Inadequate oral intake related to lethargy/confusion, poor appetite as evidenced by per patient/family report.  GOAL:   Patient will meet greater than or equal to 90% of their needs  MONITOR:   PO intake, Supplement acceptance, Weight trends, Labs, I & O's  REASON FOR ASSESSMENT:   Other (Comment) (Underweight BMI)  ASSESSMENT:   70 y.o. female with h/o chronic pancreatitis, pancreatic insufficiency, chronic pain and prior opoid dependence, non-compliance with meds including not taking her pancreatic replacement enzyme. She presents to the ED with c/o generalized weakness per husband. Nausea, vomiting, and diarrhea. Decreased PO intake and decreased UOP.  Pt seen for underweight BMI. No intakes documented; RN reports pt did not eat breakfast, pt drank 50% of Ensure, and that pt has been nauseated this AM. No family/visitors at bedside but notes indicate that pt has been experiencing N/D/weakness/decreased intakes x1 week PTA. RN reports that pt has been confused this AM.   Did not perform physical assessment while pt was sleeping; unable to visualize any/severity of wasting as pt covered from toes to chin in blankets. Per chart review, pt has lost 16 lbs (16.5% body weight) in the past 1.5 months which is significant for time frame.   Not meeting needs; will monitor for additional nutrition-related needs. Medications reviewed. IVF: NS @ 125 mL/hr. Labs reviewed; CBGS: 29-158 mg/dL since yesterday (3/7) AM, Na: 146 mmol/L, K: 2.1 mmol/L, Cl: 120 mmol/L, BUN/creatinine elevated, Ca: 7.9 mg/dL, ionized Ca: 1.08 mmol/l.   Diet Order:  Diet  Carb Modified Fluid consistency:: Thin; Room service appropriate?: Yes  Skin:  Reviewed, no issues  Last BM:  3/8  Height:   Ht Readings from Last 1 Encounters:  05/18/15 5\' 2"  (1.575 m)    Weight:   Wt Readings from Last 1 Encounters:  05/18/15 81 lb 12.7 oz (37.1 kg)    Ideal Body Weight:  50 kg (kg)  BMI:  Body mass index is 14.96 kg/(m^2).  Estimated Nutritional Needs:   Kcal:  [id number] (30-35 kcal/kg)  Protein:  55-65 grams  Fluid:  >/= 1.5 L/day  EDUCATION NEEDS:   No education needs identified at this time     Jarome Matin, RD, LDN Inpatient Clinical Dietitian Pager # [phone] After hours/weekend pager # [phone]

## 2015-05-18 NOTE — Progress Notes (Signed)
Inpatient Diabetes Program Recommendations  AACE/ADA: New Consensus Statement on Inpatient Glycemic Control (2015)  Target Ranges:  Prepandial:   less than 140 mg/dL      Peak postprandial:   less than 180 mg/dL (1-2 hours)      Critically ill patients:  140 - 180 mg/dL   Review of Glycemic Control  Results for Lindsey Gomez, Lindsey Gomez (MRN [id number]) as of 05/18/2015 10:56  Ref. Range 05/18/2015 03:50 05/18/2015 07:41 05/18/2015 08:06 05/18/2015 08:37 05/18/2015 09:12  Glucose-Capillary Latest Ref Range: 65-99 mg/dL 158 (H) 31 (LL) 41 (LL) 29 (LL) 114 (H)  Hypoglycemia likely d/t poor po intake and home dose of Lantus.  Inpatient Diabetes Program Recommendations:    Please add Lantus 12 units QHS. (Half home dose) Type 1 DM and needs basal insulin.  Will continue to follow. Thank you. Lorenda Peck, RD, LDN, CDE Inpatient Diabetes Coordinator [phone]

## 2015-05-18 NOTE — Progress Notes (Signed)
CRITICAL VALUE ALERT  Critical value received:  Potassium 2.1   Date of notification:  05/18/2015   Time of notification:  6:20 AM   Critical value read back:Yes.    Nurse who received alert:  Erling Conte, RN  MD notified (1st page):  N/A MD aware of this Potassium Level   Time of first page:  N/A  MD notified (2nd page): N/A  Time of second page: N/A  Responding MD:  N/A   Time MD responded:  N/A

## 2015-05-18 NOTE — Progress Notes (Signed)
Time: 1610                 CBG: 47 Time: 1649                 CBG: 75  Dr. Dyann Kief contacted and ordered 41mL on D50. Pt started on D51/2NS40K@ 100 and solucortef.

## 2015-05-18 NOTE — Progress Notes (Signed)
Time: [id number]      CBG: 31      8oz. Of orange juice given Time: 0806      CBG: 41      Applesauce and 1/2 ensure given Time: [id number]      CBG: 40      MD called and 1amp D50 ordered and given Time: 0912      CBG: 114    Will continue to monitor

## 2015-05-18 NOTE — Progress Notes (Addendum)
Patient seen and examined. Admitted after midnight secondary to nausea, vomiting and diarrhea. Patient with similar presentation in January of this year due to noncompliance with pancreatic enzymes. Was also found to have pneumonia on chest x-ray. On exam patient described to be feeling good, still complaining of nausea and poor appetite. She denies chest pain or shortness of breath currently. Please refer to H&P written by Dr. Alcario Drought for further info/details on admission.  Plan: -Given persistent episodes of hypoglycemia, will initiate treatment with stress dose steroids (patient chronically on hydrocortisone); will discontinue long acting insulin and initiate treatment with D5 1/2 NS as continue infusion. -Continue current antibiotics -Will continue supportive care -Repeat electrolytes as needed -Follow clinical response -Medications adjusted for creatinine clearance; also adjustment has been made due to duplicate on SSRI regimen.  Barton Dubois [phone]

## 2015-05-18 NOTE — Progress Notes (Signed)
[facility]  Patient Status: Active (receiving services up to time of hospitalization)  [facility] is providing the following services: PT  If patient discharges after hours, please call [phone].   Edwinna Areola 05/18/2015, 9:02 AM

## 2015-05-18 NOTE — ED Notes (Signed)
Patient to floor at [phone]

## 2015-05-18 NOTE — H&P (Addendum)
[facility] History and Physical  [name] [id number] DOB: [birth year] DOA: 05/17/2015  Referring physician: EDP PCP: Mathews Argyle, MD   Chief Complaint: N/V/D   HPI: Lindsey Gomez is a 70 y.o. female with h/o chronic pancreatitis, pancreatic insufficiency, chronic pain and prior opoid dependence, non-compliance with meds including not taking her pancreatic replacement enzyme.  She presents to the ED with c/o generalized weakness per husband.  Nausea, vomiting, and diarrhea.  Decreased PO intake and decreased UOP.  Unfortunately patient is confused and disoriented to time, not really able to add very much to history.  Of note patient had a nearly identical presentation with N/V/D, questionable PNA on CXR, NAG metabolic acidosis, etc. In Jan of this year.  Review of Systems: Unable to perform due to delirium.  Past Medical History  Diagnosis Date  . Depression   . HTN (hypertension)   . Chronic pain   . Fibromyalgia   . Migraines   . History of breast cancer     Right  . Chronic pancreatitis ([place])   . Vitamin D deficiency   . GERD (gastroesophageal reflux disease)   . Finger dislocation 2009    L 3rd  . COPD (chronic obstructive pulmonary disease) ([place])   . Anemia   . Fractured sternum 11/2008  . Osteoporosis     Reclast too expensive  . Barrett's esophagus   . Chronic fatigue   . Diverticulosis   . Opioid dependence ([place])   . 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 Abnormality of gait 10/12/2013  . Anxiety   . Depression   . Type II or unspecified type diabetes mellitus without mention of complication, not stated as uncontrolled   . Pancreatitis   . Hypoglycemia   . Lung nodules   . Chronic kidney disease   . Parkinson disease ([place])   . Parkinson's disease ([place]) 06/15/2014  . Convulsions/seizures ([place]) 10/12/2013    Possible benzodiazepine withdrawal seizure, x1-no further seizure activity   . Cancer ([place])     HX OF BREAST CANCER -chemo, radiation-right   .  Malnourished [facility])    Past Surgical History  Procedure Laterality Date  . Nasal sinus surgery    . Pancreatectomy      40%  . Cholecystectomy    . Appendectomy    . Abdominal hysterectomy    . Lobectomy Left 2005    granulomatous lesion.   . Breast lumpectomy Left   . Ercp N/A 10/05/2013    Procedure: ENDOSCOPIC RETROGRADE CHOLANGIOPANCREATOGRAPHY (ERCP);  Surgeon: Ladene Artist, MD;  Location: [facility] ENDOSCOPY;  Service: Endoscopy;  Laterality: N/A;  . Cataract extraction Bilateral   . Botox injection      recent botox injection 12-23-13 injection for Migraines  . Cataract extraction, bilateral Bilateral     bilateral  . Eye surgery      1 eye "membrane surgery"  . Ercp N/A 01/07/2014    Procedure: ENDOSCOPIC RETROGRADE CHOLANGIOPANCREATOGRAPHY (ERCP);  Surgeon: Inda Castle, MD;  Location: [facility] ENDOSCOPY;  Service: Endoscopy;  Laterality: N/A;  . Spyglass cholangioscopy N/A 01/07/2014    Procedure: [id number] CHOLANGIOSCOPY;  Surgeon: Inda Castle, MD;  Location: [facility] ENDOSCOPY;  Service: Endoscopy;  Laterality: N/A;  . Colonoscopy    . Upper gastrointestinal endoscopy     Social History:  reports that she has been smoking Cigarettes.  She has a 40 pack-year smoking history. She has never used smokeless tobacco. She reports that she does not drink alcohol or use illicit drugs.  Allergies  Allergen Reactions  . Milnacipran Swelling and Other (See Comments)    Headache and hand swelling   . Rizatriptan Benzoate Nausea And Vomiting  . Gabapentin Other (See Comments)    Weak muscles  . Moxifloxacin Other (See Comments)    Unknown  . Sulfonamide Derivatives Rash  . Venlafaxine Other (See Comments)    Unknown    Family History  Problem Relation Age of Onset  . COPD Father   . Heart disease Father   . CAD Father   . Cancer Father     "blood"  . Leukemia Mother   . Diabetes Maternal Uncle   . Dementia Sister   . Alzheimer's disease Sister   . Heart attack Brother   .  Colon cancer Neg Hx   . Colon polyps Neg Hx   . Esophageal cancer Neg Hx      Prior to Admission medications   Medication Sig Start Date End Date Taking? Authorizing Provider  atenolol (TENORMIN) 100 MG tablet Take 1 tablet (100 mg total) by mouth every morning. Patient taking differently: Take 50 mg by mouth at bedtime.  05/15/13  Yes Aleksei Plotnikov V, MD  atorvastatin (LIPITOR) 10 MG tablet Take 10 mg by mouth at bedtime.  06/03/14  Yes Historical Provider, MD  beta carotene w/minerals (OCUVITE) tablet Take 1 tablet by mouth 2 (two) times daily.   Yes Historical Provider, MD  escitalopram (LEXAPRO) 20 MG tablet Take 1 tablet (20 mg total) by mouth daily. 02/23/15 02/23/16 Yes Kathlee Nations, MD  ferrous sulfate 325 (65 FE) MG tablet Take 325 mg by mouth daily with breakfast.   Yes Historical Provider, MD  gabapentin (NEURONTIN) 100 MG capsule TAKE ONE CAPSULE BY MOUTH TWICE DAILY FOR 90 DAYS 01/17/15  Yes Historical Provider, MD  hydrocortisone (CORTEF) 10 MG tablet Take 10 mg by mouth 2 (two) times daily.  03/17/14  Yes Historical Provider, MD  hyoscyamine (LEVBID) 0.375 MG 12 hr tablet TAKE ONE TAB TWICE A DAY 12/13/14  Yes Inda Castle, MD  insulin aspart (NOVOLOG) 100 UNIT/ML FlexPen Inject 0-8 Units into the skin 3 (three) times daily with meals. 70-120 = 0 units, 121-150 = 1 unit, 151-200 = 2 units, 201-250 = 3 units, 251-300 = 5 units, 301-350 = 7 units. 01/13/13  Yes Renato Shin, MD  LEVEMIR FLEXTOUCH 100 UNIT/ML Pen Inject 11 Units into the skin at bedtime.  03/31/14  Yes Historical Provider, MD  LORazepam (ATIVAN) 1 MG tablet Take 1 tablet (1 mg total) by mouth 2 (two) times daily. 03/15/14  Yes Theodis Blaze, MD  Melatonin 10 MG TABS Take 1 tablet by mouth at bedtime.   Yes Historical Provider, MD  memantine (NAMENDA XR) 28 MG CP24 24 hr capsule Take 28 mg by mouth at bedtime.   Yes Historical Provider, MD  memantine (NAMENDA XR) 7 MG CP24 24 hr capsule Take 7 mg by mouth 2 (two) times  daily.   Yes Historical Provider, MD  mometasone (NASONEX) 50 MCG/ACT nasal spray Place 2 sprays into both nostrils daily as needed (for congestion).    Yes Historical Provider, MD  omeprazole (PRILOSEC) 40 MG capsule Take 40 mg by mouth daily.   Yes Historical Provider, MD  ondansetron (ZOFRAN) 8 MG tablet TAKE 1 TABLET (8 MG TOTAL) BY MOUTH 2 (TWO) TIMES DAILY. 05/04/15  Yes Historical Provider, MD  Potassium Chloride CR (MICRO-K) 8 MEQ CPCR capsule CR TAKE 1 CAPSULE BY MOUTH WITH FOOD TWICE A  DAY FOR 30 DAYS. 05/12/15  Yes Historical Provider, MD  Probiotic Product (PROBIOTIC DAILY PO) Take 1 tablet by mouth daily.    Yes Historical Provider, MD  sertraline (ZOLOFT) 25 MG tablet Take 25 mg by mouth daily.   Yes Historical Provider, MD  topiramate (TOPAMAX) 100 MG tablet TAKE 1 TABLET BY MOUTH TWICE DAILY 09/23/14  Yes Aleksei Plotnikov V, MD  vitamin B-12 (CYANOCOBALAMIN) 1000 MCG tablet Take 1,000 mcg by mouth daily.   Yes Historical Provider, MD  Vortioxetine HBr (TRINTELLIX) 20 MG TABS Take 20 mg by mouth at bedtime.   Yes Historical Provider, MD  zolmitriptan (ZOMIG) 5 MG tablet Take 1 tablet (5 mg total) by mouth as needed for migraine (max 2 tabs daily). 03/27/15  Yes Kathrynn Ducking, MD  ACCU-CHEK AVIVA PLUS test strip  07/21/14   Historical Provider, MD  acetaminophen (TYLENOL) 500 MG tablet Take 500 mg by mouth every 6 (six) hours as needed.    Historical Provider, MD  BD PEN NEEDLE NANO U/F 32G X 4 MM MISC  07/22/14   Historical Provider, MD  docusate sodium (COLACE) 100 MG capsule Take 100 mg by mouth daily as needed for mild constipation.    Historical Provider, MD  isometheptene-acetaminophen-dichloralphenazone (MIDRIN) 65-100-325 MG capsule TAKE 1 CAPSULE BY MOUTH AS NEEDED FOR HEADACHE, MAXIMUM 5 CAPSULES IN 12 HOUR FOR MIGRAINE. 8 IN 24 04/19/15   Kathrynn Ducking, MD  lipase/protease/amylase (CREON) 12000 UNITS CPEP capsule Take 1 capsule (12,000 Units total) by mouth 3 (three) times  daily before meals. Patient not taking: Reported on 03/30/2015 10/19/14   Inda Castle, MD  triamcinolone (KENALOG) 0.1 % ointment Apply 1 application topically 2 (two) times daily as needed (itching).     Historical Provider, MD   Physical Exam: Filed Vitals:   05/17/15 2344 05/17/15 2349  BP: 164/84 164/84  Pulse: 88 96  Temp:  97.6 F (36.4 C)  Resp: 21 18    BP 164/84 mmHg  Pulse 96  Temp(Src) 97.6 F (36.4 C) (Oral)  Resp 18  SpO2 100%  General Appearance:    Alert, disoriented to time, generalized weakness, confused, cachectic, frail, no distress, appears stated age  Head:    Normocephalic, atraumatic  Eyes:    PERRL, EOMI, sclera non-icteric        Nose:   Nares without drainage or epistaxis. Mucosa, turbinates normal  Throat:   Moist mucous membranes. Oropharynx without erythema or exudate.  Neck:   Supple. No carotid bruits.  No thyromegaly.  No lymphadenopathy.   Back:     No CVA tenderness, no spinal tenderness  Lungs:     Clear to auscultation bilaterally, without wheezes, rhonchi or rales  Chest wall:    No tenderness to palpitation  Heart:    Regular rate and rhythm without murmurs, gallops, rubs  Abdomen:     Soft, non-tender, nondistended, normal bowel sounds, no organomegaly  Genitalia:    deferred  Rectal:    deferred  Extremities:   No clubbing, cyanosis or edema.  Pulses:   2+ and symmetric all extremities  Skin:   Skin color, texture, turgor normal, no rashes or lesions  Lymph nodes:   Cervical, supraclavicular, and axillary nodes normal  Neurologic:   CNII-XII intact. Normal strength, sensation and reflexes      throughout    Labs on Admission:  Basic Metabolic Panel:  Recent Labs Lab 05/17/15 2047 05/17/15 2110  NA 145 145  K 2.1* 2.1*  CL 114* 117*  CO2 14*  --   GLUCOSE 173* 164*  BUN 37* 33*  CREATININE 2.71* 2.80*  CALCIUM 8.8*  --   MG 2.0  --    Liver Function Tests:  Recent Labs Lab 05/17/15 2047  AST 17  ALT 16  ALKPHOS  110  BILITOT 0.6  PROT 5.9*  ALBUMIN 2.5*    Recent Labs Lab 05/17/15 2047  LIPASE 24   No results for input(s): AMMONIA in the last 168 hours. CBC:  Recent Labs Lab 05/17/15 2110 05/17/15 2310  WBC  --  11.4*  NEUTROABS  --  8.7*  HGB 12.6 12.0  HCT 37.0 34.4*  MCV  --  91.2  PLT  --  234   Cardiac Enzymes: No results for input(s): CKTOTAL, CKMB, CKMBINDEX, TROPONINI in the last 168 hours.  BNP (last 3 results) No results for input(s): PROBNP in the last 8760 hours. CBG: No results for input(s): GLUCAP in the last 168 hours.  Radiological Exams on Admission: Dg Chest 2 View  05/17/2015  CLINICAL DATA:  Nausea, diarrhea, and decreased urine output for 1 week. Breast carcinoma. EXAM: CHEST  2 VIEW COMPARISON:  03/31/2015 FINDINGS: Severe emphysema is again demonstrated. New left lower lobe airspace disease is seen, suspicious for pneumonia. There is a persistent 3.5 cm nodular opacity in the right perihilar region, suspicious for neoplasm. Pleural-parenchymal scarring also seen in this region. Heart size is within normal limits. Tiny left pleural effusion versus pleural thickening again noted. IMPRESSION: New left lower lobe airspace disease, suspicious for pneumonia. Severe emphysema. Persistent 3.5 cm nodular opacity in right perihilar region, suspicious for neoplasm. Chest CT with contrast is suggested for further evaluation. Electronically Signed   By: Earle Gell M.D.   On: 05/17/2015 17:58    EKG: Independently reviewed.  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 N/V/D - 1. No vomiting in ED, will put on zofran for nausea 2. Will check stool panel but this was negative in Jan 3. Given the  history collected in Jan admit and the fact that although husband isnt here now to given history, per who ever did the med rec patient has not been taking her Creon (and has history of med non- compliance), I suspect that a good portion of her symptoms may be due to pancreatic insufficiency. 4. Will resume Creon (which appears to be the cornerstone of what worked for the patient during last admission). 5. IVF for hydration 2. Metabolic acidosis - likely due to GI losses of bicarb 1. Will first try to rehydrate with NS 2. Recheck BMP in AM 3. If her bicarb level drops further (like it did in Jan) then will need to switch IVF to bicarb (like they had to in Jan). 3. HCAP - 1. Infiltrate on CXR 2. WBC slightly elevated 3. Will treat as HCAP 4. PNA pathway 4. AKI - due to dehydration from #1 above, IVF and repeat BMP in AM 5. Hypokalemia - replacing 6. DM1 - 1. Continue home levemir 2. Sensitive scale SSI AC/HS 7. History of prior opiate dependence - has been off of opiates for a couple of months now, doubt that symptoms are due to withdrawal at this late date.  Code Status: Full  Family Communication: No family in room Disposition Plan: Admit to inpatient   Time spent: 42 min  Azlee Monforte M. [facility] Pager [phone]  If 7AM-7PM, please contact the day team taking care of the patient [email or web address] Password [email or web address] 05/18/2015, 2:06 AM

## 2015-05-18 NOTE — ED Notes (Signed)
Writer called for main lab for blood draws.

## 2015-05-18 NOTE — Progress Notes (Signed)
Pharmacy Antibiotic Note  Lindsey Gomez is a 70 y.o. female admitted on 05/17/2015 with pneumonia.  Pharmacy has been consulted for Vancomycin, cefepime  dosing.  Plan: Vancomycin 500mg   IV every 48 hours.  Goal trough 15-20 mcg/mL.  Cefepime 1gm iv q24hr  Height: 5\' 2"  (157.5 cm) Weight: 81 lb 12.7 oz (37.1 kg) IBW/kg (Calculated) : 50.1  Temp (24hrs), Avg:98.3 F (36.8 C), Min:97.6 F (36.4 C), Max:98.9 F (37.2 C)   Recent Labs Lab 05/17/15 2047 05/17/15 2110 05/17/15 2310 05/18/15 0230  WBC  --   --  11.4* 7.6  CREATININE 2.71* 2.80*  --  2.44*    Estimated Creatinine Clearance: 12.9 mL/min (by C-G formula based on Cr of 2.44).    Allergies  Allergen Reactions  . Milnacipran Swelling and Other (See Comments)    Headache and hand swelling   . Rizatriptan Benzoate Nausea And Vomiting  . Gabapentin Other (See Comments)    Weak muscles  . Moxifloxacin Other (See Comments)    Unknown  . Sulfonamide Derivatives Rash  . Venlafaxine Other (See Comments)    Unknown    Antimicrobials this admission: 3/7 Vanc >>  3/7 Cefepime >>   Dose adjustments this admission:  Goal: Vancomycin Trough = 15-20 mcg/ml  Microbiology results: 3/7 UCx:  Thank you for allowing pharmacy to be a part of this patient's care.  Nani Skillern Crowford 05/18/2015 5:39 AM

## 2015-05-18 NOTE — ED Notes (Signed)
Hospitalist paged for critical value.

## 2015-05-19 DIAGNOSIS — Z794 Long term (current) use of insulin: Secondary | ICD-10-CM

## 2015-05-19 DIAGNOSIS — E119 Type 2 diabetes mellitus without complications: Secondary | ICD-10-CM

## 2015-05-19 DIAGNOSIS — R413 Other amnesia: Secondary | ICD-10-CM

## 2015-05-19 DIAGNOSIS — F329 Major depressive disorder, single episode, unspecified: Secondary | ICD-10-CM

## 2015-05-19 DIAGNOSIS — F411 Generalized anxiety disorder: Secondary | ICD-10-CM

## 2015-05-19 DIAGNOSIS — E872 Acidosis: Secondary | ICD-10-CM

## 2015-05-19 LAB — GASTROINTESTINAL PANEL BY PCR, STOOL (REPLACES STOOL CULTURE)
ASTROVIRUS: NOT DETECTED
Adenovirus F40/41: NOT DETECTED
Campylobacter species: NOT DETECTED
Cryptosporidium: NOT DETECTED
Cyclospora cayetanensis: NOT DETECTED
E. COLI O157: NOT DETECTED
ENTAMOEBA HISTOLYTICA: NOT DETECTED
ENTEROAGGREGATIVE E COLI (EAEC): NOT DETECTED
ENTEROTOXIGENIC E COLI (ETEC): NOT DETECTED
Enteropathogenic E coli (EPEC): NOT DETECTED
GIARDIA LAMBLIA: NOT DETECTED
NOROVIRUS GI/GII: NOT DETECTED
Plesimonas shigelloides: NOT DETECTED
Rotavirus A: NOT DETECTED
Salmonella species: NOT DETECTED
Sapovirus (I, II, IV, and V): NOT DETECTED
Shiga like toxin producing E coli (STEC): NOT DETECTED
Shigella/Enteroinvasive E coli (EIEC): NOT DETECTED
VIBRIO CHOLERAE: NOT DETECTED
Vibrio species: NOT DETECTED
Yersinia enterocolitica: NOT DETECTED

## 2015-05-19 LAB — BASIC METABOLIC PANEL
Anion gap: 9 (ref 5–15)
BUN: 31 mg/dL — AB (ref 6–20)
CHLORIDE: 121 mmol/L — AB (ref 101–111)
CO2: 13 mmol/L — ABNORMAL LOW (ref 22–32)
CREATININE: 1.8 mg/dL — AB (ref 0.44–1.00)
Calcium: 8 mg/dL — ABNORMAL LOW (ref 8.9–10.3)
GFR calc Af Amer: 32 mL/min — ABNORMAL LOW (ref >60)
GFR, EST NON AFRICAN AMERICAN: 28 mL/min — AB (ref >60)
GLUCOSE: 296 mg/dL — AB (ref 65–99)
POTASSIUM: 3.2 mmol/L — AB (ref 3.5–5.1)
Sodium: 143 mmol/L (ref 135–145)

## 2015-05-19 LAB — MAGNESIUM: Magnesium: 1.9 mg/dL (ref 1.7–2.4)

## 2015-05-19 LAB — GLUCOSE, CAPILLARY
GLUCOSE-CAPILLARY: 247 mg/dL — AB (ref 65–99)
GLUCOSE-CAPILLARY: 200 mg/dL — AB (ref 65–99)
GLUCOSE-CAPILLARY: 209 mg/dL — AB (ref 65–99)
Glucose-Capillary: 202 mg/dL — ABNORMAL HIGH (ref 65–99)
Glucose-Capillary: 235 mg/dL — ABNORMAL HIGH (ref 65–99)
Glucose-Capillary: 244 mg/dL — ABNORMAL HIGH (ref 65–99)

## 2015-05-19 LAB — URINE CULTURE: Culture: NO GROWTH

## 2015-05-19 MED ORDER — TOPIRAMATE 100 MG PO TABS
100.0000 mg | ORAL_TABLET | Freq: Every day | ORAL | Status: DC
  Administered 2015-05-20 – 2015-05-23 (×4): 100 mg via ORAL
  Filled 2015-05-19 (×4): qty 1

## 2015-05-19 MED ORDER — POTASSIUM CHLORIDE CRYS ER 20 MEQ PO TBCR
40.0000 meq | EXTENDED_RELEASE_TABLET | ORAL | Status: AC
  Administered 2015-05-19: 40 meq via ORAL
  Filled 2015-05-19: qty 2

## 2015-05-19 MED ORDER — SODIUM BICARBONATE 650 MG PO TABS
650.0000 mg | ORAL_TABLET | Freq: Two times a day (BID) | ORAL | Status: DC
  Administered 2015-05-19 – 2015-05-20 (×2): 650 mg via ORAL
  Filled 2015-05-19 (×2): qty 1

## 2015-05-19 NOTE — Progress Notes (Signed)
[facility] PROGRESS NOTE  Lindsey Gomez [id number] DOB: September 24, 1945 DOA: 05/17/2015 PCP: Mathews Argyle, MD  Assessment/Plan: 1-NV/D: with dehydration, hypernatremia, hypokalemia and metabolic acidosis  -slowly improving -less episodes of loose stools; no further vomiting -still nauseous  -will continue IVF's, electrolytes repletion  -continue creon  -follow results from GI panel -continue tx for PNA  2-HCAP: with concerns for aspiration as well -patient with second admission for PNA -hx of memory impairment and reports from caregiver about throat clearing and coughing spells around meals -will continue mucinex, PRN nebulizer, IV antibiotics and follow culture data  3-metabolic acidosis  -due to GI loses and possible contribution from topamax -will continue IVF's, electrolytes repletion -patient creon resumed -topamax to be weaned off   4-AKI:pre-renal in nature and secondary to GI loses and poor hydration -continue IVF's -Cr improving -will follow renal function trend  5-type 2 diabetes: insulin dependent  -with ongoing hypoglycemia on admission; required to be started on D5 infusion -will continue holding long acting insulin until am -continue SSI (with intentions to treat only if CBG's > 200)  6-chronic steroids use: presume adrenal insufficiency -will continue solucortef  7-memory impairment/depression/anxiety -will continue  Namenda, lexapro and ativan -patient also on Brintellix   8-GERD: continue PPI  Code Status: Full Family Communication: attempt phone call to daughter Ivin Booty; just reach voicemail); caregiver was at bedside  Disposition Plan: remains inpatient, continue electrolytes repletion, IV antibiotics, supportive care and follow culture data.   Consultants:  None   Procedures:  See below for x-ray reports  Antibiotics:  Vancomycin and cefepime 3/8  HPI/Subjective: Afebrile, still with nausea and poor appetite; electrolytes  slowly improving. Patient with loose stool still (less episodes). No further hypoglycemic events   Objective: Filed Vitals:   05/19/15 0522 05/19/15 1339  BP: 127/78 138/78  Pulse: 66 78  Temp: 97.3 F (36.3 C) 97.4 F (36.3 C)  Resp: 20 28    Intake/Output Summary (Last 24 hours) at 05/19/15 1736 Last data filed at 05/19/15 1342  Gross per 24 hour  Intake   2410 ml  Output   1150 ml  Net   1260 ml   Filed Weights   05/18/15 0332  Weight: 37.1 kg (81 lb 12.7 oz)    Exam:   General:  Afebrile, still with productive cough, nauseated and not eating much. Electrolytes improving and no longer hypoglycemic. Denies CP and reports breathing improved overall   Cardiovascular: S1 and S2, no rubs, no gallops, no murmur; no JVD on exam  Respiratory: diffuse rhonchi, no wheezing and no crackles  Abdomen: soft, vague tenderness to palpation, no distension   Musculoskeletal: no edema or cyanosis   Data Reviewed: Basic Metabolic Panel:  Recent Labs Lab 05/17/15 2047 05/17/15 2110 05/18/15 0230 05/19/15 0434  NA 145 145 146* 143  K 2.1* 2.1* 2.1* 3.2*  CL 114* 117* 120* 121*  CO2 14*  --  13* 13*  GLUCOSE 173* 164* 187* 296*  BUN 37* 33* 35* 31*  CREATININE 2.71* 2.80* 2.44* 1.80*  CALCIUM 8.8*  --  7.9* 8.0*  MG 2.0  --   --  1.9   Liver Function Tests:  Recent Labs Lab 05/17/15 2047  AST 17  ALT 16  ALKPHOS 110  BILITOT 0.6  PROT 5.9*  ALBUMIN 2.5*    Recent Labs Lab 05/17/15 2047  LIPASE 24   CBC:  Recent Labs Lab 05/17/15 2110 05/17/15 2310 05/18/15 0230  WBC  --  11.4* 7.6  NEUTROABS  --  8.7*  --   HGB 12.6 12.0 11.7*  HCT 37.0 34.4* 32.1*  MCV  --  91.2 89.7  PLT  --  234 149*   CBG:  Recent Labs Lab 05/19/15 0006 05/19/15 0418 05/19/15 0722 05/19/15 1148 05/19/15 1610  GLUCAP 202* 247* 235* 200* 209*    Recent Results (from the past 240 hour(s))  Urine culture     Status: None   Collection Time: 05/17/15  7:01 PM  Result  Value Ref Range Status   Specimen Description URINE, CATHETERIZED  Final   Special Requests NONE  Final   Culture   Final    NO GROWTH 1 DAY Performed at [facility]    Report Status 05/19/2015 FINAL  Final  Culture, blood (routine x 2) Call MD if unable to obtain prior to antibiotics being given     Status: None (Preliminary result)   Collection Time: 05/17/15  8:50 PM  Result Value Ref Range Status   Specimen Description BLOOD LEFT WRIST  Final   Special Requests IN PEDIATRIC BOTTLE 3CC  Final   Culture   Final    NO GROWTH 1 DAY Performed at [facility]    Report Status PENDING  Incomplete  Culture, sputum-assessment     Status: None   Collection Time: 05/18/15  4:57 AM  Result Value Ref Range Status   Specimen Description SPUTUM  Final   Special Requests NONE  Final   Sputum evaluation   Final    THIS SPECIMEN IS ACCEPTABLE. RESPIRATORY CULTURE REPORT TO FOLLOW.   Report Status 05/18/2015 FINAL  Final  Culture, respiratory (NON-Expectorated)     Status: None (Preliminary result)   Collection Time: 05/18/15  4:57 AM  Result Value Ref Range Status   Specimen Description SPUTUM  Final   Special Requests NONE  Final   Gram Stain   Final    MODERATE WBC PRESENT,BOTH PMN AND MONONUCLEAR RARE SQUAMOUS EPITHELIAL CELLS PRESENT RARE GRAM POSITIVE COCCI IN PAIRS THIS SPECIMEN IS ACCEPTABLE FOR SPUTUM CULTURE Performed at [facility]    Culture   Final    ABUNDANT STAPHYLOCOCCUS AUREUS Note: RIFAMPIN AND GENTAMICIN SHOULD NOT BE USED AS SINGLE DRUGS FOR TREATMENT OF STAPH INFECTIONS. Performed at [facility]    Report Status PENDING  Incomplete  Culture, blood (routine x 2) Call MD if unable to obtain prior to antibiotics being given     Status: None (Preliminary result)   Collection Time: 05/18/15  4:59 AM  Result Value Ref Range Status   Specimen Description BLOOD LEFT HAND  Final   Special Requests IN PEDIATRIC BOTTLE 2CC  Final   Culture    Final    NO GROWTH 1 DAY Performed at [facility]    Report Status PENDING  Incomplete     Studies: Dg Chest 2 View  05/17/2015  CLINICAL DATA:  Nausea, diarrhea, and decreased urine output for 1 week. Breast carcinoma. EXAM: CHEST  2 VIEW COMPARISON:  03/31/2015 FINDINGS: Severe emphysema is again demonstrated. New left lower lobe airspace disease is seen, suspicious for pneumonia. There is a persistent 3.5 cm nodular opacity in the right perihilar region, suspicious for neoplasm. Pleural-parenchymal scarring also seen in this region. Heart size is within normal limits. Tiny left pleural effusion versus pleural thickening again noted. IMPRESSION: New left lower lobe airspace disease, suspicious for pneumonia. Severe emphysema. Persistent 3.5 cm nodular opacity in right perihilar region, suspicious for neoplasm. Chest CT with contrast is  suggested for further evaluation. Electronically Signed   By: Earle Gell M.D.   On: 05/17/2015 17:58    Scheduled Meds: . atenolol  50 mg Oral QHS  . atorvastatin  10 mg Oral QHS  . ceFEPime (MAXIPIME) IV  1 g Intravenous Q24H  . escitalopram  10 mg Oral Daily  . feeding supplement (ENSURE ENLIVE)  237 mL Oral BID BM  . ferrous sulfate  325 mg Oral Q breakfast  . fluticasone  2 spray Each Nare Daily  . gabapentin  100 mg Oral BID  . heparin  5,000 Units Subcutaneous 3 times per day  . hydrocortisone sod succinate (SOLU-CORTEF) inj  50 mg Intravenous Q8H  . insulin aspart  0-9 Units Subcutaneous 6 times per day  . lipase/protease/amylase  12,000 Units Oral TID AC  . LORazepam  1 mg Oral BID  . memantine  14 mg Oral QHS  . pantoprazole  80 mg Oral Daily  . sodium bicarbonate  650 mg Oral BID  . [START ON 05/20/2015] topiramate  100 mg Oral Daily  . [START ON 05/20/2015] vancomycin  500 mg Intravenous Q48H  . Vortioxetine HBr  20 mg Oral QHS   Continuous Infusions: . dextrose 5 % and 0.45 % NaCl with KCl 40 mEq/L 100 mL/hr at 05/19/15 0128     Time spent: 35 minutes    Barton Dubois  [facility] Pager [phone]. If 7PM-7AM, please contact night-coverage at [email or web address], password [email or web address] 05/19/2015, 5:36 PM  LOS: 1 day

## 2015-05-20 ENCOUNTER — Other Ambulatory Visit: Payer: Self-pay | Admitting: Neurology

## 2015-05-20 ENCOUNTER — Inpatient Hospital Stay (HOSPITAL_COMMUNITY): Payer: Medicare Other

## 2015-05-20 DIAGNOSIS — R112 Nausea with vomiting, unspecified: Secondary | ICD-10-CM

## 2015-05-20 DIAGNOSIS — E2740 Unspecified adrenocortical insufficiency: Secondary | ICD-10-CM

## 2015-05-20 DIAGNOSIS — R197 Diarrhea, unspecified: Secondary | ICD-10-CM

## 2015-05-20 LAB — BASIC METABOLIC PANEL
ANION GAP: 8 (ref 5–15)
BUN: 23 mg/dL — AB (ref 6–20)
CHLORIDE: 123 mmol/L — AB (ref 101–111)
CO2: 10 mmol/L — ABNORMAL LOW (ref 22–32)
Calcium: 8.2 mg/dL — ABNORMAL LOW (ref 8.9–10.3)
Creatinine, Ser: 1.68 mg/dL — ABNORMAL HIGH (ref 0.44–1.00)
GFR calc Af Amer: 35 mL/min — ABNORMAL LOW (ref >60)
GFR calc non Af Amer: 30 mL/min — ABNORMAL LOW (ref >60)
GLUCOSE: 170 mg/dL — AB (ref 65–99)
POTASSIUM: 4.5 mmol/L (ref 3.5–5.1)
Sodium: 141 mmol/L (ref 135–145)

## 2015-05-20 LAB — GLUCOSE, CAPILLARY
GLUCOSE-CAPILLARY: 169 mg/dL — AB (ref 65–99)
GLUCOSE-CAPILLARY: 187 mg/dL — AB (ref 65–99)
GLUCOSE-CAPILLARY: 227 mg/dL — AB (ref 65–99)
Glucose-Capillary: 203 mg/dL — ABNORMAL HIGH (ref 65–99)
Glucose-Capillary: 158 mg/dL — ABNORMAL HIGH (ref 65–99)
Glucose-Capillary: 174 mg/dL — ABNORMAL HIGH (ref 65–99)

## 2015-05-20 LAB — CULTURE, RESPIRATORY W GRAM STAIN

## 2015-05-20 LAB — CULTURE, RESPIRATORY

## 2015-05-20 MED ORDER — CEFAZOLIN SODIUM 1-5 GM-% IV SOLN
1.0000 g | Freq: Two times a day (BID) | INTRAVENOUS | Status: DC
  Filled 2015-05-20: qty 50

## 2015-05-20 MED ORDER — METRONIDAZOLE IN NACL 5-0.79 MG/ML-% IV SOLN
500.0000 mg | Freq: Three times a day (TID) | INTRAVENOUS | Status: DC
  Administered 2015-05-20 – 2015-05-22 (×6): 500 mg via INTRAVENOUS
  Filled 2015-05-20 (×6): qty 100

## 2015-05-20 MED ORDER — CEFAZOLIN SODIUM 1-5 GM-% IV SOLN
1.0000 g | Freq: Two times a day (BID) | INTRAVENOUS | Status: DC
  Administered 2015-05-20 – 2015-05-21 (×3): 1 g via INTRAVENOUS
  Filled 2015-05-20 (×5): qty 50

## 2015-05-20 MED ORDER — GUAIFENESIN-DM 100-10 MG/5ML PO SYRP
5.0000 mL | ORAL_SOLUTION | ORAL | Status: DC | PRN
  Administered 2015-05-20: 5 mL via ORAL
  Filled 2015-05-20: qty 10

## 2015-05-20 MED ORDER — LOPERAMIDE HCL 2 MG PO CAPS
2.0000 mg | ORAL_CAPSULE | ORAL | Status: DC | PRN
  Administered 2015-05-21 – 2015-05-22 (×2): 2 mg via ORAL
  Filled 2015-05-20 (×2): qty 1

## 2015-05-20 MED ORDER — SODIUM BICARBONATE 650 MG PO TABS
1300.0000 mg | ORAL_TABLET | Freq: Two times a day (BID) | ORAL | Status: DC
  Administered 2015-05-20 – 2015-05-21 (×3): 1300 mg via ORAL
  Filled 2015-05-20 (×4): qty 2

## 2015-05-20 NOTE — Evaluation (Addendum)
Clinical/Bedside Swallow Evaluation Patient Details  Name: Lindsey Gomez MRN: [id number] Date of Birth: 1945/03/25  Today's Date: 05/20/2015 Time: SLP Start Time (ACUTE ONLY): 1025 SLP Stop Time (ACUTE ONLY): 1040 SLP Time Calculation (min) (ACUTE ONLY): 15 min  Past Medical History:  Past Medical History  Diagnosis Date  . Depression   . HTN (hypertension)   . Chronic pain   . Fibromyalgia   . Migraines   . History of breast cancer     Right  . Chronic pancreatitis ([place])   . Vitamin D deficiency   . GERD (gastroesophageal reflux disease)   . Finger dislocation 2009    L 3rd  . COPD (chronic obstructive pulmonary disease) ([place])   . Anemia   . Fractured sternum 11/2008  . Osteoporosis     Reclast too expensive  . Barrett's esophagus   . Chronic fatigue   . Diverticulosis   . Opioid dependence ([place])   . 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 Abnormality of gait 10/12/2013  . Anxiety   . Depression   . Type II or unspecified type diabetes mellitus without mention of complication, not stated as uncontrolled   . Pancreatitis   . Hypoglycemia   . Lung nodules   . Chronic kidney disease   . Parkinson disease ([place])   . Parkinson's disease ([place]) 06/15/2014  . Convulsions/seizures ([place]) 10/12/2013    Possible benzodiazepine withdrawal seizure, x1-no further seizure activity   . Cancer ([place])     HX OF BREAST CANCER -chemo, radiation-right   . Malnourished [facility])    Past Surgical History:  Past Surgical History  Procedure Laterality Date  . Nasal sinus surgery    . Pancreatectomy      40%  . Cholecystectomy    . Appendectomy    . Abdominal hysterectomy    . Lobectomy Left 2005    granulomatous lesion.   . Breast lumpectomy Left   . Ercp N/A 10/05/2013    Procedure: ENDOSCOPIC RETROGRADE CHOLANGIOPANCREATOGRAPHY (ERCP);  Surgeon: Ladene Artist, MD;  Location: [facility] ENDOSCOPY;  Service: Endoscopy;  Laterality: N/A;  . Cataract extraction Bilateral   . Botox injection      recent botox  injection 12-23-13 injection for Migraines  . Cataract extraction, bilateral Bilateral     bilateral  . Eye surgery      1 eye "membrane surgery"  . Ercp N/A 01/07/2014    Procedure: ENDOSCOPIC RETROGRADE CHOLANGIOPANCREATOGRAPHY (ERCP);  Surgeon: Inda Castle, MD;  Location: [facility] ENDOSCOPY;  Service: Endoscopy;  Laterality: N/A;  . Spyglass cholangioscopy N/A 01/07/2014    Procedure: [id number] CHOLANGIOSCOPY;  Surgeon: Inda Castle, MD;  Location: [facility] ENDOSCOPY;  Service: Endoscopy;  Laterality: N/A;  . Colonoscopy    . Upper gastrointestinal endoscopy     HPI:      Assessment / Plan / Recommendation Clinical Impression  Patient presents with a mild oral and moderate oropharyngeal dysphagia as evidenced by immediate and delayed cough after PO intake of thin liquids via cup and straw, and prolonged oral transit of regular solids. MD reported that patient was admitted in January of this year with similar symptoms as current admission, and both times she has had PNA. Because of her current presentation and recent h/o recurrent PNA, will proceed with MBSS today.    Aspiration Risk  Moderate aspiration risk    Diet Recommendation TBD after MBS   Liquid Administration via: Cup;No straw Medication Administration: Whole meds with liquid Supervision: Staff to assist with self feeding;Full  supervision/cueing for compensatory strategies Compensations: Minimize environmental distractions;Slow rate;Small sips/bites Postural Changes: Seated upright at 90 degrees    Other  Recommendations Oral Care Recommendations: Oral care BID   Follow up Recommendations  None    Frequency and Duration min 1 x/week  1 week       Prognosis Prognosis for Safe Diet Advancement: Good      Swallow Study   General      Oral/Motor/Sensory Function Overall Oral Motor/Sensory Function: Within functional limits   Ice Chips     Thin Liquid Thin Liquid: Impaired Presentation: Cup;Straw Pharyngeal  Phase  Impairments: Suspected delayed Swallow;Cough - Delayed;Cough - Immediate;Throat Clearing - Delayed    Nectar Thick Nectar Thick Liquid: Within functional limits Presentation: Cup;Self Fed   Honey Thick Honey Thick Liquid: Not tested   Puree Puree: Within functional limits Presentation: Self Fed   Solid   GO   Solid: Impaired Oral Phase Impairments: Impaired mastication Oral Phase Functional Implications: Prolonged oral transit;Impaired mastication;Other (comment) (patient is edentulous)        Lindsey Gomez 05/20/2015,3:47 PM    Lindsey Gomez, [place], CCC-SLP 05/20/2015 3:48 PM

## 2015-05-20 NOTE — Progress Notes (Signed)
Pharmacy Antibiotic Note  Lindsey Gomez is a 71 y.o. female admitted on 05/17/2015 with pneumonia.  Pharmacy has been consulted for Cefepime and Vancomycin dosing, now narrowed to Cefazolin for MSSA pneumonia and Flagyl for anaerobe coverage d/t aspiration risk.  Plan:  Cefazolin 1g IV q12h  Flagyl 500mg  IV q8h  Follow up renal fxn, culture results, and clinical course.   Height: 5\' 2"  (157.5 cm) Weight: 81 lb 12.7 oz (37.1 kg) IBW/kg (Calculated) : 50.1  Temp (24hrs), Avg:98 F (36.7 C), Min:97.4 F (36.3 C), Max:98.9 F (37.2 C)   Recent Labs Lab 05/17/15 2047 05/17/15 2110 05/17/15 2310 05/18/15 0230 05/19/15 0434 05/20/15 0752  WBC  --   --  11.4* 7.6  --   --   CREATININE 2.71* 2.80*  --  2.44* 1.80* 1.68*    Estimated Creatinine Clearance: 18.8 mL/min (by C-G formula based on Cr of 1.68).    Allergies  Allergen Reactions  . Milnacipran Swelling and Other (See Comments)    Headache and hand swelling   . Rizatriptan Benzoate Nausea And Vomiting  . Gabapentin Other (See Comments)    Weak muscles  . Moxifloxacin Other (See Comments)    Unknown  . Sulfonamide Derivatives Rash  . Venlafaxine Other (See Comments)    Unknown    Antimicrobials this admission: 3/7 Vanc >> 3/10 3/7 Cefepime >> 3/10 3/10 Cefazolin >> 3/10 Flagyl >>  Dose adjustments this admission:  Microbiology results: 3/7 UCx: NGF 3/8 BCx: ngtd 3/8 Sputum: abundant MSSA 3/8 Strep pneumo Ur Ag: negative 3/8 HIV: nonreactive 3/8 GI panel PCR: none detected  Thank you for allowing pharmacy to be a part of this patient's care.  Gretta Arab PharmD, BCPS Pager [phone] 05/20/2015 11:02 AM

## 2015-05-20 NOTE — Progress Notes (Signed)
Consult request received, patient seen and examined, discussed with Dr Dyann Kief in detail.  Call placed and discussed with patient's husband Mr. Brynnlea Snowden at [phone].  PLAN: Family meeting with patient, caregiver [name] and patient's husband Mr Schollmeyer on 05-21-15 at 10:30 AM in the patient's room. We will attempt to call in the patient's daughter who lives out of town.   Full note and further recommendations to follow.  Thank you for the consult  Loistine Chance MD [facility] health palliative medicine team [phone] office [phone] pager

## 2015-05-20 NOTE — Progress Notes (Signed)
[facility] PROGRESS NOTE  Lindsey Gomez [id number] DOB: October 05, 1945 DOA: 05/17/2015 PCP: Mathews Argyle, MD  Assessment/Plan: 1-NV/D: with dehydration, hypernatremia, hypokalemia and metabolic acidosis  -slowly improving -continue to have loose stools, but less episodes  -still nauseous; but no further vomiting   -will continue IVF's, electrolytes repletion  -continue creon  -results from GI panel neg -continue tx for PNA as mentioned below -will use PRN loperamide  2-MSSA HCAP: with concerns for aspiration as well -patient with second admission for PNA -hx of memory impairment and reports from caregiver about throat clearing and coughing spells around meals -will continue mucinex, PRN nebulizer and supportive care -antibiotics tailored to covered MSSA and anaerobes with concerns for aspiration (Ancef and Flagyl) -SPL evaluation requested   3-metabolic acidosis  -due to GI loses, AKI and possible contribution from topamax -will continue IVF's, electrolytes repletion -patient creon resumed -topamax started to be weaned off  -Bicarb PO initiated    4-AKI: pre-renal in nature and secondary to GI loses and poor hydration -continue IVF's -Cr continue improving -will follow renal function trend  5-type 2 diabetes: insulin dependent  -with ongoing hypoglycemia on admission; required to be started on D5 infusion -will continue holding long acting insulin  -continue SSI (with intentions to treat only if CBG's > 200)  6-chronic steroids use: presume adrenal insufficiency/crisis  -will continue solucortef for now  7-memory impairment/depression/anxiety -will continue  Namenda, lexapro and ativan -patient also on Brintellix   8-GERD: continue PPI  Code Status: Full Family Communication: discussed with daughter at bedside Ivin Booty; lives in [place]); caregiver was at bedside  Disposition Plan: remains inpatient, continue electrolytes repletion, IV antibiotics,  supportive care and follow culture data.   Consultants:  Palliative care  SPL  Procedures:  See below for x-ray reports  Antibiotics:  Vancomycin and cefepime 3/8>>>3/10  Ancef and Flagyl 3/10  HPI/Subjective: Afebrile, still with nausea and poor appetite; electrolytes slowly improving. Patient still with loose stool (but less episodes). No further hypoglycemic events, but requiring D5 infusion  Objective: Filed Vitals:   05/19/15 2023 05/20/15 0428  BP: 145/90 167/91  Pulse: 80 73  Temp: 97.8 F (36.6 C) 98.9 F (37.2 C)  Resp: 20 20    Intake/Output Summary (Last 24 hours) at 05/20/15 1238 Last data filed at 05/20/15 0429  Gross per 24 hour  Intake   2305 ml  Output    800 ml  Net   1505 ml   Filed Weights   05/18/15 0332  Weight: 37.1 kg (81 lb 12.7 oz)    Exam:   General:  Afebrile, still with productive cough, feeling nauseated and complaining of being cold. Poor appetite. Denies CP and is overall oriented X 1 only. Able to follow commands.  Cardiovascular: S1 and S2, no rubs, no gallops, no murmur; no JVD on exam  Respiratory: diffuse rhonchi, no wheezing and no crackles  Abdomen: soft, vague tenderness to palpation, no distension   Musculoskeletal: no edema or cyanosis   Data Reviewed: Basic Metabolic Panel:  Recent Labs Lab 05/17/15 2047 05/17/15 2110 05/18/15 0230 05/19/15 0434 05/20/15 0752  NA 145 145 146* 143 141  K 2.1* 2.1* 2.1* 3.2* 4.5  CL 114* 117* 120* 121* 123*  CO2 14*  --  13* 13* 10*  GLUCOSE 173* 164* 187* 296* 170*  BUN 37* 33* 35* 31* 23*  CREATININE 2.71* 2.80* 2.44* 1.80* 1.68*  CALCIUM 8.8*  --  7.9* 8.0* 8.2*  MG 2.0  --   --  1.9  --    Liver Function Tests:  Recent Labs Lab 05/17/15 2047  AST 17  ALT 16  ALKPHOS 110  BILITOT 0.6  PROT 5.9*  ALBUMIN 2.5*    Recent Labs Lab 05/17/15 2047  LIPASE 24   CBC:  Recent Labs Lab 05/17/15 2110 05/17/15 2310 05/18/15 0230  WBC  --  11.4* 7.6   NEUTROABS  --  8.7*  --   HGB 12.6 12.0 11.7*  HCT 37.0 34.4* 32.1*  MCV  --  91.2 89.7  PLT  --  234 149*   CBG:  Recent Labs Lab 05/19/15 1610 05/19/15 2020 05/19/15 2354 05/20/15 0424 05/20/15 0810  GLUCAP 209* 244* 187* 203* 158*    Recent Results (from the past 240 hour(s))  Urine culture     Status: None   Collection Time: 05/17/15  7:01 PM  Result Value Ref Range Status   Specimen Description URINE, CATHETERIZED  Final   Special Requests NONE  Final   Culture   Final    NO GROWTH 1 DAY Performed at [facility]    Report Status 05/19/2015 FINAL  Final  Culture, blood (routine x 2) Call MD if unable to obtain prior to antibiotics being given     Status: None (Preliminary result)   Collection Time: 05/17/15  8:50 PM  Result Value Ref Range Status   Specimen Description BLOOD LEFT WRIST  Final   Special Requests IN PEDIATRIC BOTTLE 3CC  Final   Culture   Final    NO GROWTH 1 DAY Performed at [facility]    Report Status PENDING  Incomplete  Culture, sputum-assessment     Status: None   Collection Time: 05/18/15  4:57 AM  Result Value Ref Range Status   Specimen Description SPUTUM  Final   Special Requests NONE  Final   Sputum evaluation   Final    THIS SPECIMEN IS ACCEPTABLE. RESPIRATORY CULTURE REPORT TO FOLLOW.   Report Status 05/18/2015 FINAL  Final  Culture, respiratory (NON-Expectorated)     Status: None   Collection Time: 05/18/15  4:57 AM  Result Value Ref Range Status   Specimen Description SPUTUM  Final   Special Requests NONE  Final   Gram Stain   Final    MODERATE WBC PRESENT,BOTH PMN AND MONONUCLEAR RARE SQUAMOUS EPITHELIAL CELLS PRESENT RARE GRAM POSITIVE COCCI IN PAIRS THIS SPECIMEN IS ACCEPTABLE FOR SPUTUM CULTURE Performed at [facility]    Culture   Final    ABUNDANT STAPHYLOCOCCUS AUREUS Note: RIFAMPIN AND GENTAMICIN SHOULD NOT BE USED AS SINGLE DRUGS FOR TREATMENT OF STAPH INFECTIONS. Performed at [facility]    Report Status 05/20/2015 FINAL  Final   Organism ID, Bacteria STAPHYLOCOCCUS AUREUS  Final      Susceptibility   Staphylococcus aureus - MIC*    CLINDAMYCIN <=0.25 SENSITIVE Sensitive     ERYTHROMYCIN <=0.25 SENSITIVE Sensitive     GENTAMICIN <=0.5 SENSITIVE Sensitive     LEVOFLOXACIN <=0.12 SENSITIVE Sensitive     OXACILLIN 0.5 SENSITIVE Sensitive     RIFAMPIN <=0.5 SENSITIVE Sensitive     TRIMETH/SULFA <=10 SENSITIVE Sensitive     VANCOMYCIN 1 SENSITIVE Sensitive     TETRACYCLINE <=1 SENSITIVE Sensitive     MOXIFLOXACIN <=0.25 SENSITIVE Sensitive     * ABUNDANT STAPHYLOCOCCUS AUREUS  Culture, blood (routine x 2) Call MD if unable to obtain prior to antibiotics being given     Status: None (Preliminary result)  Collection Time: 05/18/15  4:59 AM  Result Value Ref Range Status   Specimen Description BLOOD LEFT HAND  Final   Special Requests IN PEDIATRIC BOTTLE 2CC  Final   Culture   Final    NO GROWTH 1 DAY Performed at [facility]    Report Status PENDING  Incomplete  Gastrointestinal Panel by PCR , Stool     Status: None   Collection Time: 05/18/15 12:48 PM  Result Value Ref Range Status   Campylobacter species NOT DETECTED NOT DETECTED Final   Plesimonas shigelloides NOT DETECTED NOT DETECTED Final   Salmonella species NOT DETECTED NOT DETECTED Final   Yersinia enterocolitica NOT DETECTED NOT DETECTED Final   Vibrio species NOT DETECTED NOT DETECTED Final   Vibrio cholerae NOT DETECTED NOT DETECTED Final   Enteroaggregative E coli (EAEC) NOT DETECTED NOT DETECTED Final   Enteropathogenic E coli (EPEC) NOT DETECTED NOT DETECTED Final   Enterotoxigenic E coli (ETEC) NOT DETECTED NOT DETECTED Final   Shiga like toxin producing E coli (STEC) NOT DETECTED NOT DETECTED Final   E. coli O157 NOT DETECTED NOT DETECTED Final   Shigella/Enteroinvasive E coli (EIEC) NOT DETECTED NOT DETECTED Final   Cryptosporidium NOT DETECTED NOT DETECTED Final    Cyclospora cayetanensis NOT DETECTED NOT DETECTED Final   Entamoeba histolytica NOT DETECTED NOT DETECTED Final   Giardia lamblia NOT DETECTED NOT DETECTED Final   Adenovirus F40/41 NOT DETECTED NOT DETECTED Final   Astrovirus NOT DETECTED NOT DETECTED Final   Norovirus GI/GII NOT DETECTED NOT DETECTED Final   Rotavirus A NOT DETECTED NOT DETECTED Final   Sapovirus (I, II, IV, and V) NOT DETECTED NOT DETECTED Final     Studies: No results found.  Scheduled Meds: . atenolol  50 mg Oral QHS  . atorvastatin  10 mg Oral QHS  .  ceFAZolin (ANCEF) IV  1 g Intravenous Q12H  . escitalopram  10 mg Oral Daily  . feeding supplement (ENSURE ENLIVE)  237 mL Oral BID BM  . ferrous sulfate  325 mg Oral Q breakfast  . fluticasone  2 spray Each Nare Daily  . gabapentin  100 mg Oral BID  . heparin  5,000 Units Subcutaneous 3 times per day  . hydrocortisone sod succinate (SOLU-CORTEF) inj  50 mg Intravenous Q8H  . insulin aspart  0-9 Units Subcutaneous 6 times per day  . lipase/protease/amylase  12,000 Units Oral TID AC  . LORazepam  1 mg Oral BID  . memantine  14 mg Oral QHS  . metronidazole  500 mg Intravenous Q8H  . pantoprazole  80 mg Oral Daily  . sodium bicarbonate  1,300 mg Oral BID  . topiramate  100 mg Oral Daily  . Vortioxetine HBr  20 mg Oral QHS   Continuous Infusions: . dextrose 5 % and 0.45 % NaCl with KCl 40 mEq/L 75 mL/hr at 05/20/15 0506    Time spent: 35 minutes    Barton Dubois  [facility] Pager [phone]. If 7PM-7AM, please contact night-coverage at [email or web address], password [facility] 05/20/2015, 12:38 PM  LOS: 2 days

## 2015-05-20 NOTE — Progress Notes (Signed)
PT Cancellation Note  Patient Details Name: Lindsey Gomez MRN: [id number] DOB: 1945-11-09   Cancelled Treatment:    Reason Eval/Treat Not Completed: Attempted PT eval-pt declined to participate. Pt repeatedly stated "I just want to sleep." Will check back another day.    Weston Anna, MPT Pager: [phone]

## 2015-05-21 DIAGNOSIS — Z7189 Other specified counseling: Secondary | ICD-10-CM

## 2015-05-21 DIAGNOSIS — E872 Acidosis, unspecified: Secondary | ICD-10-CM | POA: Insufficient documentation

## 2015-05-21 DIAGNOSIS — G3183 Dementia with Lewy bodies: Secondary | ICD-10-CM

## 2015-05-21 DIAGNOSIS — E1043 Type 1 diabetes mellitus with diabetic autonomic (poly)neuropathy: Secondary | ICD-10-CM

## 2015-05-21 DIAGNOSIS — Z515 Encounter for palliative care: Secondary | ICD-10-CM | POA: Insufficient documentation

## 2015-05-21 DIAGNOSIS — J189 Pneumonia, unspecified organism: Secondary | ICD-10-CM

## 2015-05-21 DIAGNOSIS — F0280 Dementia in other diseases classified elsewhere without behavioral disturbance: Secondary | ICD-10-CM

## 2015-05-21 LAB — GLUCOSE, CAPILLARY
GLUCOSE-CAPILLARY: 159 mg/dL — AB (ref 65–99)
GLUCOSE-CAPILLARY: 199 mg/dL — AB (ref 65–99)
GLUCOSE-CAPILLARY: 214 mg/dL — AB (ref 65–99)
GLUCOSE-CAPILLARY: 253 mg/dL — AB (ref 65–99)
Glucose-Capillary: 191 mg/dL — ABNORMAL HIGH (ref 65–99)
Glucose-Capillary: 166 mg/dL — ABNORMAL HIGH (ref 65–99)

## 2015-05-21 MED ORDER — INSULIN ASPART 100 UNIT/ML ~~LOC~~ SOLN
0.0000 [IU] | SUBCUTANEOUS | Status: DC
  Administered 2015-05-21 (×2): 2 [IU] via SUBCUTANEOUS
  Administered 2015-05-21: 5 [IU] via SUBCUTANEOUS
  Administered 2015-05-21: 3 [IU] via SUBCUTANEOUS
  Administered 2015-05-22 (×2): 2 [IU] via SUBCUTANEOUS

## 2015-05-21 NOTE — Evaluation (Signed)
Physical Therapy Evaluation Patient Details Name: Lindsey Gomez MRN: [id number] DOB: [birth year] Today's Date: 05/21/2015   History of Present Illness  Patient is a 70 y.o. female with PMH: chronic pancreatitis, pancreatic insufficiency, chronic pain, non-compliance with meds, who presented to ED with c/o generalized weakness, nausea, vomiting, diarrhea, and poor PO intake. Patient with recent admission for PNA in January of this year. Pt  just had a palliative meeting and home with hospice now.   Clinical Impression  Pt with great weakness, balance deficits and decreased mobility. Difficult to have clear understanding of pt's PLOF or ability, and what assistance she will have at home for mobility. She will need Mod to Max assist at times, and may need a WC for mobilizing. Will follow here to continue with patients ability and any family education as necessary.     Follow Up Recommendations  (unknown, family not present during session, and pt to gohome with hospice)    Equipment Recommendations  Wheelchair (measurements PT) (feel pt may need WC for home)    Recommendations for Other Services       Precautions / Restrictions Restrictions Weight Bearing Restrictions: No      Mobility  Bed Mobility Overal bed mobility: Needs Assistance Bed Mobility: Sit to Supine;Supine to Sit     Supine to sit: Mod assist Sit to supine: Max assist   General bed mobility comments: assist for upper body and getting legs on and off the bed. Max assist for scooting to EOB to come up to sitting  Transfers Overall transfer level: Needs assistance Equipment used: 1 person hand held assist (you have to have one arm around her back for balance and the other arm she holds onto with both hands . would do better with B HHA if you were able. ) Transfers: Sit to/from Stand           General transfer comment: retropulsion and leaning posteriorly with standing. Very fearful of falling, "I need help, I am  falling"   Ambulation/Gait Ambulation/Gait assistance: Max assist (really need 2 person assist for B HHA and IV pole for smoother ambulation and mobility) Ambulation Distance (Feet): 15 Feet (twice and stood at sink for hygiene and redressing for 5 minutes , Min/Mod A the entire time.  for the static standing) Assistive device: 1 person hand held assist (would be better with B HHA )       General Gait Details: small BOS and small steps   Stairs            Wheelchair Mobility    Modified Rankin (Stroke Patients Only)       Balance Overall balance assessment: Needs assistance Sitting-balance support: Bilateral upper extremity supported (unable to self correct when leaning R and posteriorly, required modA entire 4 minutes sitting EOB ) Sitting balance-Leahy Scale: Poor   Postural control: Posterior lean;Right lateral lean   Standing balance-Leahy Scale: Fair                               Pertinent Vitals/Pain Pain Assessment:  (pt had no complaints of pain)    Home Living Family/patient expects to be discharged to:: Private residence Living Arrangements: Spouse/significant other Available Help at Discharge: Family;Personal care attendant;Neighbor;Available 24 hours/day Type of Home: House Home Access: Stairs to enter Entrance Stairs-Rails: Psychiatric nurse of Steps: 3 Home Layout: One level Home Equipment: Walker - 2 wheels;Cane - single point;Shower seat Additional Comments:  this imformation is from previous admission 1 month ago. Pt unable to state all of this information today. will need a WC if goes home at this level    Prior Function Level of Independence: Needs assistance         Comments: unclear of how much pateint was able to do, not family present during evaluation and pt not able to give [place] details     Hand Dominance        Extremity/Trunk Assessment               Lower Extremity Assessment: Generalized  weakness         Communication   Communication: No difficulties  Cognition Arousal/Alertness: Awake/alert Behavior During Therapy: WFL for tasks assessed/performed Overall Cognitive Status: Within Functional Limits for tasks assessed (very fearful of falling had to continuously be cued or reminded of tasks and safety)                      General Comments      Exercises        Assessment/Plan    PT Assessment Patient needs continued PT services  PT Diagnosis Difficulty walking;Generalized weakness   PT Problem List Decreased strength;Decreased activity tolerance;Decreased mobility;Decreased knowledge of use of DME  PT Treatment Interventions Gait training;Functional mobility training;Therapeutic activities;Patient/family education   PT Goals (Current goals can be found in the Care Plan section) Acute Rehab PT Goals PT Goal Formulation: Patient unable to participate in goal setting Time For Goal Achievement: 06/04/15 Potential to Achieve Goals: Fair    Frequency Min 3X/week   Barriers to discharge        Co-evaluation               End of Session Equipment Utilized During Treatment: Gait belt Activity Tolerance: Patient tolerated treatment well Patient left: in bed;with call bell/phone within reach;with bed alarm set Nurse Communication: Mobility status         Time: [id number] PT Time Calculation (min) (ACUTE ONLY): 25 min   Charges:   PT Evaluation $PT Eval Moderate Complexity: 1 Procedure PT Treatments $Therapeutic Activity: 8-22 mins   PT G CodesClide Dales 2015/06/18, 3:09 PM  Clide Dales, PT Pager: [phone] 2015-06-18

## 2015-05-21 NOTE — Progress Notes (Signed)
[facility] PROGRESS NOTE  [name] [id number] DOB: 1945-05-30 DOA: 05/17/2015 PCP: Mathews Argyle, MD  Assessment/Plan: 1-NV/D: with dehydration, hypernatremia, hypokalemia and metabolic acidosis  -slowly improving -continue to have loose stools, but less episodes  -still nauseous; but no further vomiting   -will continue IVF's, electrolytes repletion  -continue creon  -results from GI panel neg -continue tx for PNA as mentioned below -will continue use PRN loperamide -will follow results form [place] meeting   2-MSSA HCAP: with concerns for aspiration as well -patient with second admission for PNA -hx of memory impairment and reports from caregiver about throat clearing and coughing spells around meals -will continue mucinex, PRN nebulizer and supportive care -antibiotics tailored to covered MSSA and anaerobes with concerns for aspiration (Ancef and Flagyl) -SPL evaluation requested  -palliative care consulted for [place] and advance directives   3-metabolic acidosis  -due to GI loses, AKI and possible contribution from topamax -will continue IVF's, electrolytes repletion -patient creon resumed -topamax started to be weaned off  continue bicarb  4-AKI: pre-renal in nature and secondary to GI loses and poor hydration -continue IVF's -Cr continue improving -will follow renal function trend  5-type 2 diabetes: insulin dependent  -with ongoing hypoglycemia on admission; required to be started on D5 infusion -will continue holding long acting insulin  -continue SSI (with intentions to treat only if CBG's > 200)  6-chronic steroids use: presume adrenal insufficiency/crisis  -will continue solucortef for now  7-memory impairment/depression/anxiety -will continue  Namenda, lexapro and ativan -patient also on Brintellix   8-GERD: continue PPI  Code Status: Full Family Communication: discussed with daughter at bedside Ivin Booty; lives in [place]) on 3/10   Disposition Plan: remains inpatient, continue electrolytes repletion, IV antibiotics, supportive care and follow outcome/decision from [place] meeting    Consultants:  Palliative care  SPL  Procedures:  See below for x-ray reports  Antibiotics:  Vancomycin and cefepime 3/8>>>3/10  Ancef and Flagyl 3/10  HPI/Subjective: Afebrile, still with nausea and poor appetite. Oriented X1 and with poor insight and knowledge of her condition overall   Objective: Filed Vitals:   05/21/15 0456 05/21/15 1609  BP: 135/82 154/79  Pulse: 68 72  Temp: 98.3 F (36.8 C) 97.1 F (36.2 C)  Resp: 20 18    Intake/Output Summary (Last 24 hours) at 05/21/15 1825 Last data filed at 05/21/15 1700  Gross per 24 hour  Intake   2290 ml  Output      0 ml  Net   2290 ml   Filed Weights   05/18/15 0332  Weight: 37.1 kg (81 lb 12.7 oz)    Exam:   General:  Afebrile, still with productive cough and just not feeling good. Oriented X1 mainly. Some nausea, but no vomiting reported. Poor appetite. Denies CP.  Cardiovascular: S1 and S2, no rubs, no gallops, no murmur; no JVD on exam  Respiratory: diffuse rhonchi, no wheezing and no crackles  Abdomen: soft, vague tenderness to palpation, no distension   Musculoskeletal: no edema or cyanosis   Data Reviewed: Basic Metabolic Panel:  Recent Labs Lab 05/17/15 2047 05/17/15 2110 05/18/15 0230 05/19/15 0434 05/20/15 0752  NA 145 145 146* 143 141  K 2.1* 2.1* 2.1* 3.2* 4.5  CL 114* 117* 120* 121* 123*  CO2 14*  --  13* 13* 10*  GLUCOSE 173* 164* 187* 296* 170*  BUN 37* 33* 35* 31* 23*  CREATININE 2.71* 2.80* 2.44* 1.80* 1.68*  CALCIUM 8.8*  --  7.9* 8.0* 8.2*  MG 2.0  --   --  1.9  --    Liver Function Tests:  Recent Labs Lab 05/17/15 2047  AST 17  ALT 16  ALKPHOS 110  BILITOT 0.6  PROT 5.9*  ALBUMIN 2.5*    Recent Labs Lab 05/17/15 2047  LIPASE 24   CBC:  Recent Labs Lab 05/17/15 2110 05/17/15 2310 05/18/15 0230   WBC  --  11.4* 7.6  NEUTROABS  --  8.7*  --   HGB 12.6 12.0 11.7*  HCT 37.0 34.4* 32.1*  MCV  --  91.2 89.7  PLT  --  234 149*   CBG:  Recent Labs Lab 05/21/15 0054 05/21/15 0452 05/21/15 0739 05/21/15 1103 05/21/15 1656  GLUCAP 214* 191* 159* 166* 253*    Recent Results (from the past 240 hour(s))  Urine culture     Status: None   Collection Time: 05/17/15  7:01 PM  Result Value Ref Range Status   Specimen Description URINE, CATHETERIZED  Final   Special Requests NONE  Final   Culture   Final    NO GROWTH 1 DAY Performed at [facility]    Report Status 05/19/2015 FINAL  Final  Culture, blood (routine x 2) Call MD if unable to obtain prior to antibiotics being given     Status: None (Preliminary result)   Collection Time: 05/17/15  8:50 PM  Result Value Ref Range Status   Specimen Description BLOOD LEFT WRIST  Final   Special Requests IN PEDIATRIC BOTTLE 3CC  Final   Culture   Final    NO GROWTH 3 DAYS Performed at [facility]    Report Status PENDING  Incomplete  Culture, sputum-assessment     Status: None   Collection Time: 05/18/15  4:57 AM  Result Value Ref Range Status   Specimen Description SPUTUM  Final   Special Requests NONE  Final   Sputum evaluation   Final    THIS SPECIMEN IS ACCEPTABLE. RESPIRATORY CULTURE REPORT TO FOLLOW.   Report Status 05/18/2015 FINAL  Final  Culture, respiratory (NON-Expectorated)     Status: None   Collection Time: 05/18/15  4:57 AM  Result Value Ref Range Status   Specimen Description SPUTUM  Final   Special Requests NONE  Final   Gram Stain   Final    MODERATE WBC PRESENT,BOTH PMN AND MONONUCLEAR RARE SQUAMOUS EPITHELIAL CELLS PRESENT RARE GRAM POSITIVE COCCI IN PAIRS THIS SPECIMEN IS ACCEPTABLE FOR SPUTUM CULTURE Performed at [facility]    Culture   Final    ABUNDANT STAPHYLOCOCCUS AUREUS Note: RIFAMPIN AND GENTAMICIN SHOULD NOT BE USED AS SINGLE DRUGS FOR TREATMENT OF STAPH  INFECTIONS. Performed at [facility]    Report Status 05/20/2015 FINAL  Final   Organism ID, Bacteria STAPHYLOCOCCUS AUREUS  Final      Susceptibility   Staphylococcus aureus - MIC*    CLINDAMYCIN <=0.25 SENSITIVE Sensitive     ERYTHROMYCIN <=0.25 SENSITIVE Sensitive     GENTAMICIN <=0.5 SENSITIVE Sensitive     LEVOFLOXACIN <=0.12 SENSITIVE Sensitive     OXACILLIN 0.5 SENSITIVE Sensitive     RIFAMPIN <=0.5 SENSITIVE Sensitive     TRIMETH/SULFA <=10 SENSITIVE Sensitive     VANCOMYCIN 1 SENSITIVE Sensitive     TETRACYCLINE <=1 SENSITIVE Sensitive     MOXIFLOXACIN <=0.25 SENSITIVE Sensitive     * ABUNDANT STAPHYLOCOCCUS AUREUS  Culture, blood (routine x 2) Call MD if unable to obtain prior to antibiotics being given  Status: None (Preliminary result)   Collection Time: 05/18/15  4:59 AM  Result Value Ref Range Status   Specimen Description BLOOD LEFT HAND  Final   Special Requests IN PEDIATRIC BOTTLE 2CC  Final   Culture   Final    NO GROWTH 3 DAYS Performed at [facility]    Report Status PENDING  Incomplete  Gastrointestinal Panel by PCR , Stool     Status: None   Collection Time: 05/18/15 12:48 PM  Result Value Ref Range Status   Campylobacter species NOT DETECTED NOT DETECTED Final   Plesimonas shigelloides NOT DETECTED NOT DETECTED Final   Salmonella species NOT DETECTED NOT DETECTED Final   Yersinia enterocolitica NOT DETECTED NOT DETECTED Final   Vibrio species NOT DETECTED NOT DETECTED Final   Vibrio cholerae NOT DETECTED NOT DETECTED Final   Enteroaggregative E coli (EAEC) NOT DETECTED NOT DETECTED Final   Enteropathogenic E coli (EPEC) NOT DETECTED NOT DETECTED Final   Enterotoxigenic E coli (ETEC) NOT DETECTED NOT DETECTED Final   Shiga like toxin producing E coli (STEC) NOT DETECTED NOT DETECTED Final   E. coli O157 NOT DETECTED NOT DETECTED Final   Shigella/Enteroinvasive E coli (EIEC) NOT DETECTED NOT DETECTED Final   Cryptosporidium NOT  DETECTED NOT DETECTED Final   Cyclospora cayetanensis NOT DETECTED NOT DETECTED Final   Entamoeba histolytica NOT DETECTED NOT DETECTED Final   Giardia lamblia NOT DETECTED NOT DETECTED Final   Adenovirus F40/41 NOT DETECTED NOT DETECTED Final   Astrovirus NOT DETECTED NOT DETECTED Final   Norovirus GI/GII NOT DETECTED NOT DETECTED Final   Rotavirus A NOT DETECTED NOT DETECTED Final   Sapovirus (I, II, IV, and V) NOT DETECTED NOT DETECTED Final     Studies: Dg Swallowing Func-speech Pathology  05/20/2015  Objective Swallowing Evaluation: Type of Study: MBS-Modified Barium Swallow Study Patient Details Name: Lindsey Gomez MRN: [id number] Date of Birth: [birth year] Today's Date: 05/20/2015 Time: SLP Start Time (ACUTE ONLY): 1315-SLP Stop Time (ACUTE ONLY): 1330 SLP Time Calculation (min) (ACUTE ONLY): 15 min Past Medical History: Past Medical History Diagnosis Date . Depression  . HTN (hypertension)  . Chronic pain  . Fibromyalgia  . Migraines  . History of breast cancer    Right . Chronic pancreatitis ([place])  . Vitamin D deficiency  . GERD (gastroesophageal reflux disease)  . Finger dislocation 2009   L 3rd . COPD (chronic obstructive pulmonary disease) ([place])  . Anemia  . Fractured sternum 11/2008 . Osteoporosis    Reclast too expensive . Barrett's esophagus  . Chronic fatigue  . Diverticulosis  . Opioid dependence ([place])  . Memory disorder 10/12/2013 . Abnormality of gait 10/12/2013 . Anxiety  . Depression  . Type II or unspecified type diabetes mellitus without mention of complication, not stated as uncontrolled  . Pancreatitis  . Hypoglycemia  . Lung nodules  . Chronic kidney disease  . Parkinson disease ([place])  . Parkinson's disease ([place]) 06/15/2014 . Convulsions/seizures ([place]) 10/12/2013   Possible benzodiazepine withdrawal seizure, x1-no further seizure activity  . Cancer ([place])    HX OF BREAST CANCER -chemo, radiation-right  . Malnourished [facility])  Past Surgical History: Past Surgical History Procedure Laterality  Date . Nasal sinus surgery   . Pancreatectomy     40% . Cholecystectomy   . Appendectomy   . Abdominal hysterectomy   . Lobectomy Left 2005   granulomatous lesion.  . Breast lumpectomy Left  . Ercp N/A 10/05/2013   Procedure: ENDOSCOPIC RETROGRADE CHOLANGIOPANCREATOGRAPHY (ERCP);  Surgeon: Ladene Artist, MD;  Location: [facility] ENDOSCOPY;  Service: Endoscopy;  Laterality: N/A; . Cataract extraction Bilateral  . Botox injection     recent botox injection 12-23-13 injection for Migraines . Cataract extraction, bilateral Bilateral    bilateral . Eye surgery     1 eye "membrane surgery" . Ercp N/A 01/07/2014   Procedure: ENDOSCOPIC RETROGRADE CHOLANGIOPANCREATOGRAPHY (ERCP);  Surgeon: Inda Castle, MD;  Location: [facility] ENDOSCOPY;  Service: Endoscopy;  Laterality: N/A; . Spyglass cholangioscopy N/A 01/07/2014   Procedure: [id number] CHOLANGIOSCOPY;  Surgeon: Inda Castle, MD;  Location: [facility] ENDOSCOPY;  Service: Endoscopy;  Laterality: N/A; . Colonoscopy   . Upper gastrointestinal endoscopy   HPI: Patient is a 70 y.o. female with PMH: chronic pancreatitis, pancreatic insufficiency, chronic pain, non-compliance with meds, who presented to ED with c/o generalized weakness, nausea, vomiting, diarrhea, and poor PO intake. Patient with recent admission for PNA in January of this year. Subjective: pleasant and cooperative Assessment / Plan / Recommendation CHL IP CLINICAL IMPRESSIONS 05/20/2015 Therapy Diagnosis Mild oral phase dysphagia;Mild pharyngeal phase dysphagia Clinical Impression Patient presents with a mild oral and mild pharyngeal dysphagia. Oral dysphagia is characterized by decreased lingual ROM and ability to transit pill anteriorly to posterior in oral cavity as well as decreased mastication and oral manipulation of hard solid. Patient's pharyngeal dysphagia was characterized by swallow initiation delays to vallecular sinus, one instance of flash penetration of thin liquids via straw sip, and mild amount of residuals  in vallecular sinus with regular, puree and nectar thick liquids, and trace-mild vallecular residuals with thin liquids. Patient exhibited full clearance of pharyngeal residuals with sip of thin liquids.  Impact on safety and function Mild aspiration risk   CHL IP TREATMENT RECOMMENDATION 05/20/2015 Treatment Recommendations Therapy as outlined in treatment plan below   Prognosis 05/20/2015 Prognosis for Safe Diet Advancement Good Barriers to Reach Goals -- Barriers/Prognosis Comment -- CHL IP DIET RECOMMENDATION 05/20/2015 SLP Diet Recommendations Regular solids;Thin liquid Liquid Administration via Cup;Straw Medication Administration Crushed with puree Compensations Minimize environmental distractions;Slow rate;Small sips/bites Postural Changes Remain semi-upright after after feeds/meals (Comment);Seated upright at 90 degrees   CHL IP OTHER RECOMMENDATIONS 05/20/2015 Recommended Consults -- Oral Care Recommendations Oral care BID Other Recommendations --   CHL IP FOLLOW UP RECOMMENDATIONS 05/20/2015 Follow up Recommendations None   CHL IP FREQUENCY AND DURATION 05/20/2015 Speech Therapy Frequency (ACUTE ONLY) min 1 x/week Treatment Duration 1 week      CHL IP ORAL PHASE 05/20/2015 Oral Phase Impaired Oral - Pudding Teaspoon -- Oral - Pudding Cup -- Oral - Honey Teaspoon -- Oral - Honey Cup -- Oral - Nectar Teaspoon -- Oral - Nectar Cup -- Oral - Nectar Straw -- Oral - Thin Teaspoon -- Oral - Thin Cup -- Oral - Thin Straw -- Oral - Puree -- Oral - Mech Soft -- Oral - Regular Impaired mastication;Delayed oral transit Oral - Multi-Consistency -- Oral - Pill Weak lingual manipulation;Other (Comment) Oral Phase - Comment --  CHL IP PHARYNGEAL PHASE 05/20/2015 Pharyngeal Phase Impaired Pharyngeal- Pudding Teaspoon -- Pharyngeal -- Pharyngeal- Pudding Cup -- Pharyngeal -- Pharyngeal- Honey Teaspoon -- Pharyngeal -- Pharyngeal- Honey Cup -- Pharyngeal -- Pharyngeal- Nectar Teaspoon -- Pharyngeal -- Pharyngeal- Nectar Cup Delayed  swallow initiation-vallecula;Pharyngeal residue - valleculae Pharyngeal -- Pharyngeal- Nectar Straw -- Pharyngeal -- Pharyngeal- Thin Teaspoon -- Pharyngeal -- Pharyngeal- Thin Cup Delayed swallow initiation-vallecula Pharyngeal -- Pharyngeal- Thin Straw Delayed swallow initiation-vallecula;Penetration/Aspiration during swallow Pharyngeal Material enters airway, remains ABOVE vocal cords then ejected  out Pharyngeal- Puree Delayed swallow initiation-vallecula;Pharyngeal residue - valleculae;Compensatory strategies attempted (with notebox) Pharyngeal -- Pharyngeal- Mechanical Soft -- Pharyngeal -- Pharyngeal- Regular Delayed swallow initiation-vallecula;Pharyngeal residue - valleculae;Compensatory strategies attempted (with notebox) Pharyngeal -- Pharyngeal- Multi-consistency -- Pharyngeal -- Pharyngeal- Pill Other (Comment) Pharyngeal -- Pharyngeal Comment --  CHL IP CERVICAL ESOPHAGEAL PHASE 05/20/2015 Cervical Esophageal Phase WFL Pudding Teaspoon -- Pudding Cup -- Honey Teaspoon -- Honey Cup -- Nectar Teaspoon -- Nectar Cup -- Nectar Straw -- Thin Teaspoon -- Thin Cup -- Thin Straw -- Puree -- Mechanical Soft -- Regular -- Multi-consistency -- Pill -- Cervical Esophageal Comment -- No flowsheet data found. [name] 05/20/2015, 4:03 PM    [name], MA, CCC-SLP 05/20/2015 4:04 PM            Scheduled Meds: . atenolol  50 mg Oral QHS  . atorvastatin  10 mg Oral QHS  .  ceFAZolin (ANCEF) IV  1 g Intravenous Q12H  . escitalopram  10 mg Oral Daily  . feeding supplement (ENSURE ENLIVE)  237 mL Oral BID BM  . ferrous sulfate  325 mg Oral Q breakfast  . fluticasone  2 spray Each Nare Daily  . gabapentin  100 mg Oral BID  . heparin  5,000 Units Subcutaneous 3 times per day  . hydrocortisone sod succinate (SOLU-CORTEF) inj  50 mg Intravenous Q8H  . insulin aspart  0-9 Units Subcutaneous 6 times per day  . lipase/protease/amylase  12,000 Units Oral TID AC  . LORazepam  1 mg Oral BID  .  memantine  14 mg Oral QHS  . metronidazole  500 mg Intravenous Q8H  . pantoprazole  80 mg Oral Daily  . sodium bicarbonate  1,300 mg Oral BID  . topiramate  100 mg Oral Daily  . Vortioxetine HBr  20 mg Oral QHS   Continuous Infusions: . dextrose 5 % and 0.45 % NaCl with KCl 40 mEq/L 75 mL/hr at 05/20/15 1808    Time spent: 30 minutes    Barton Dubois  [facility] Pager [phone]. If 7PM-7AM, please contact night-coverage at [email or web address], password [facility] 05/21/2015, 6:25 PM  LOS: 3 days

## 2015-05-21 NOTE — Consult Note (Signed)
Consultation Note Date: 05/21/2015   Patient Name: Lindsey Gomez  DOB: 06-Mar-1946  MRN: [id number]  Age / Sex: 70 y.o., female  PCP: Lajean Manes, MD Referring Physician: Barton Dubois, MD  Reason for Consultation: Establishing goals of care    Clinical Assessment/Narrative: Patient is a 70 y.o. female with PMH: chronic pancreatitis, pancreatic insufficiency, chronic pain, non-compliance with meds, who presented to ED with c/o generalized weakness, nausea, vomiting, diarrhea, and poor PO intake. Patient with recent admission for PNA in January of this year.    Patient remains admitted with N/V/D, dehydration, hypernatremia, MSSA HCAP. Patient has been seen by speech therapy, MBS study done, recommendations noted. Patient has DM, she is on chronic steroids use, she also has memory impairment/depression/anxiety.   Palliative care consulted for goals of care discussions. Call placed and discussed with the patient's husband on 3-10. Family meeting held today with the patient, her husband, her primary caregiver. Later on, daughter updated over the phone.    Patient wishes to go home. Discussed regarding Hospice services. Patient and family agreeable to hospice care at home. Patient has a living will, her code status is DNR    Contacts/Participants in Discussion: Primary Decision Maker:     Relationship to Patient  Husband, daughter   HCPOA: yes     SUMMARY OF RECOMMENDATIONS: DNR DNI Home with Hospice Discussed with patient, husband, caregiver. Daughter notified over the phone.    Code Status/Advance Care Planning: DNR    Code Status Orders        Start     Ordered   05/21/15 1048  Do not attempt resuscitation (DNR)   Continuous    Question Answer Comment  In the event of cardiac or respiratory ARREST Do not call a "code blue"   In the event of cardiac or respiratory ARREST Do not perform Intubation, CPR,  defibrillation or ACLS   In the event of cardiac or respiratory ARREST Use medication by any route, position, wound care, and other measures to relive pain and suffering. May use oxygen, suction and manual treatment of airway obstruction as needed for comfort.      05/21/15 1047    Code Status History    Date Active Date Inactive Code Status Order ID Comments User Context   05/18/2015 12:47 AM 05/21/2015 10:47 AM Full Code [id number]  Etta Quill, DO ED   03/31/2015  3:07 AM 04/06/2015  5:00 PM Full Code [id number]  Norval Morton, MD ED   04/09/2014  1:11 PM 04/15/2014  6:46 PM Full Code [id number]  Flonnie Overman Dhungel, MD Inpatient   03/11/2014  6:02 PM 03/15/2014  4:42 PM DNR [id number]  Thurnell Lose, MD Inpatient   10/01/2013  5:00 PM 10/07/2013  4:03 PM Full Code [id number]  Nita Sells, MD Inpatient      Other Directives:Living Will  Symptom Management:    as above   Palliative Prophylaxis:   Delirium Protocol   Psycho-social/Spiritual:  Support System: [place] [facility] Desire for further Chaplaincy support:no Additional Recommendations: Caregiving  Support/Resources  Prognosis: < 3 months  Discharge Planning: Home with Hospice  Chief Complaint/ Primary Diagnoses: Present on Admission:  . Acute renal failure ([place]) . Type 1 diabetes mellitus with neurological manifestations ([place]) . Abdominal pain, chronic, right lower quadrant . Opioid dependence ([place]) . HCAP (healthcare-associated pneumonia) . HYPOKALEMIA . Nausea vomiting and diarrhea  I have reviewed the medical record, interviewed the patient and family, and examined the patient. The following aspects are pertinent.  Past  Medical History  Diagnosis Date  . Depression   . HTN (hypertension)   . Chronic pain   . Fibromyalgia   . Migraines   . History of breast cancer     Right  . Chronic pancreatitis ([place])   . Vitamin D deficiency   . GERD (gastroesophageal reflux disease)   . Finger dislocation 2009    L 3rd  .  COPD (chronic obstructive pulmonary disease) ([place])   . Anemia   . Fractured sternum 11/2008  . Osteoporosis     Reclast too expensive  . Barrett's esophagus   . Chronic fatigue   . Diverticulosis   . Opioid dependence ([place])   . 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 Abnormality of gait 10/12/2013  . Anxiety   . Depression   . Type II or unspecified type diabetes mellitus without mention of complication, not stated as uncontrolled   . Pancreatitis   . Hypoglycemia   . Lung nodules   . Chronic kidney disease   . Parkinson disease ([place])   . Parkinson's disease ([place]) 06/15/2014  . Convulsions/seizures ([place]) 10/12/2013    Possible benzodiazepine withdrawal seizure, x1-no further seizure activity   . Cancer ([place])     HX OF BREAST CANCER -chemo, radiation-right   . Malnourished [facility])    Social History   Social History  . Marital Status: Married    Spouse Name: [name]  . Number of Children: 1  . Years of Education: BA   Occupational History  . Retired     Disabled Now    Social History Main Topics  . Smoking status: Current Every Day Smoker -- 1.00 packs/day for 40 years    Types: Cigarettes    Last Attempt to Quit: 01/25/2014  . Smokeless tobacco: Never Used  . Alcohol Use: No  . Drug Use: No  . Sexual Activity: No   Other Topics Concern  . None   Social History Narrative   Did not go back to work Oct 23, [name] is very ill - unable to have financial ends meet, no income      Regular Exercise -  NO      Patient is right handed.   Patient drinks 2 cups of caffeine per day.   Family History  Problem Relation Age of Onset  . COPD Father   . Heart disease Father   . CAD Father   . Cancer Father     "blood"  . Leukemia Mother   . Diabetes Maternal Uncle   . Dementia Sister   . Alzheimer's disease Sister   . Heart attack Brother   . Colon cancer Neg Hx   . Colon polyps Neg Hx   . Esophageal cancer Neg Hx    Scheduled Meds: . atenolol  50 mg Oral QHS  . atorvastatin  10 mg  Oral QHS  .  ceFAZolin (ANCEF) IV  1 g Intravenous Q12H  . escitalopram  10 mg Oral Daily  . feeding supplement (ENSURE ENLIVE)  237 mL Oral BID BM  . ferrous sulfate  325 mg Oral Q breakfast  . fluticasone  2 spray Each Nare Daily  . gabapentin  100 mg Oral BID  . heparin  5,000 Units Subcutaneous 3 times per day  . hydrocortisone sod succinate (SOLU-CORTEF) inj  50 mg Intravenous Q8H  . insulin aspart  0-9 Units Subcutaneous 6 times per day  . lipase/protease/amylase  12,000 Units Oral TID AC  . LORazepam  1 mg Oral BID  .  memantine  14 mg Oral QHS  . metronidazole  500 mg Intravenous Q8H  . pantoprazole  80 mg Oral Daily  . sodium bicarbonate  1,300 mg Oral BID  . topiramate  100 mg Oral Daily  . Vortioxetine HBr  20 mg Oral QHS   Continuous Infusions: . dextrose 5 % and 0.45 % NaCl with KCl 40 mEq/L 75 mL/hr at 05/20/15 1808   PRN Meds:.acetaminophen, guaiFENesin-dextromethorphan, loperamide, ondansetron (ZOFRAN) IV, ondansetron, prochlorperazine Medications Prior to Admission:  Prior to Admission medications   Medication Sig Start Date End Date Taking? Authorizing Provider  atenolol (TENORMIN) 100 MG tablet Take 1 tablet (100 mg total) by mouth every morning. Patient taking differently: Take 50 mg by mouth at bedtime.  05/15/13  Yes Aleksei Plotnikov V, MD  atorvastatin (LIPITOR) 10 MG tablet Take 10 mg by mouth at bedtime.  06/03/14  Yes Historical Provider, MD  beta carotene w/minerals (OCUVITE) tablet Take 1 tablet by mouth 2 (two) times daily.   Yes Historical Provider, MD  escitalopram (LEXAPRO) 20 MG tablet Take 1 tablet (20 mg total) by mouth daily. 02/23/15 02/23/16 Yes Kathlee Nations, MD  ferrous sulfate 325 (65 FE) MG tablet Take 325 mg by mouth daily with breakfast.   Yes Historical Provider, MD  gabapentin (NEURONTIN) 100 MG capsule TAKE ONE CAPSULE BY MOUTH TWICE DAILY FOR 90 DAYS 01/17/15  Yes Historical Provider, MD  hydrocortisone (CORTEF) 10 MG tablet Take 10 mg by  mouth 2 (two) times daily.  03/17/14  Yes Historical Provider, MD  hyoscyamine (LEVBID) 0.375 MG 12 hr tablet TAKE ONE TAB TWICE A DAY 12/13/14  Yes Inda Castle, MD  insulin aspart (NOVOLOG) 100 UNIT/ML FlexPen Inject 0-8 Units into the skin 3 (three) times daily with meals. 70-120 = 0 units, 121-150 = 1 unit, 151-200 = 2 units, 201-250 = 3 units, 251-300 = 5 units, 301-350 = 7 units. 01/13/13  Yes Renato Shin, MD  LEVEMIR FLEXTOUCH 100 UNIT/ML Pen Inject 11 Units into the skin at bedtime.  03/31/14  Yes Historical Provider, MD  LORazepam (ATIVAN) 1 MG tablet Take 1 tablet (1 mg total) by mouth 2 (two) times daily. 03/15/14  Yes Theodis Blaze, MD  Melatonin 10 MG TABS Take 1 tablet by mouth at bedtime.   Yes Historical Provider, MD  memantine (NAMENDA XR) 28 MG CP24 24 hr capsule Take 28 mg by mouth at bedtime.   Yes Historical Provider, MD  memantine (NAMENDA XR) 7 MG CP24 24 hr capsule Take 7 mg by mouth 2 (two) times daily.   Yes Historical Provider, MD  mometasone (NASONEX) 50 MCG/ACT nasal spray Place 2 sprays into both nostrils daily as needed (for congestion).    Yes Historical Provider, MD  omeprazole (PRILOSEC) 40 MG capsule Take 40 mg by mouth daily.   Yes Historical Provider, MD  ondansetron (ZOFRAN) 8 MG tablet TAKE 1 TABLET (8 MG TOTAL) BY MOUTH 2 (TWO) TIMES DAILY. 05/04/15  Yes Historical Provider, MD  Potassium Chloride CR (MICRO-K) 8 MEQ CPCR capsule CR TAKE 1 CAPSULE BY MOUTH WITH FOOD TWICE A DAY FOR 30 DAYS. 05/12/15  Yes Historical Provider, MD  Probiotic Product (PROBIOTIC DAILY PO) Take 1 tablet by mouth daily.    Yes Historical Provider, MD  sertraline (ZOLOFT) 25 MG tablet Take 25 mg by mouth daily.   Yes Historical Provider, MD  topiramate (TOPAMAX) 100 MG tablet TAKE 1 TABLET BY MOUTH TWICE DAILY 09/23/14  Yes Cassandria Anger, MD  vitamin B-12 (CYANOCOBALAMIN) 1000 MCG tablet Take 1,000 mcg by mouth daily.   Yes Historical Provider, MD  Vortioxetine HBr (TRINTELLIX) 20 MG  TABS Take 20 mg by mouth at bedtime.   Yes Historical Provider, MD  zolmitriptan (ZOMIG) 5 MG tablet Take 1 tablet (5 mg total) by mouth as needed for migraine (max 2 tabs daily). 03/27/15  Yes Kathrynn Ducking, MD  ACCU-CHEK AVIVA PLUS test strip  07/21/14   Historical Provider, MD  acetaminophen (TYLENOL) 500 MG tablet Take 500 mg by mouth every 6 (six) hours as needed.    Historical Provider, MD  BD PEN NEEDLE NANO U/F 32G X 4 MM MISC  07/22/14   Historical Provider, MD  docusate sodium (COLACE) 100 MG capsule Take 100 mg by mouth daily as needed for mild constipation.    Historical Provider, MD  isometheptene-acetaminophen-dichloralphenazone (MIDRIN) 65-100-325 MG capsule TAKE 1 CAPSULE BY MOUTH AS NEEDED FOR HEADACHE, MAXIMUM 5 CAPSULES IN 12 HOUR FOR MIGRAINE. 8 IN 24 04/19/15   Kathrynn Ducking, MD  lipase/protease/amylase (CREON) 12000 UNITS CPEP capsule Take 1 capsule (12,000 Units total) by mouth 3 (three) times daily before meals. Patient not taking: Reported on 03/30/2015 10/19/14   Inda Castle, MD  NAMENDA XR 28 MG CP24 24 hr capsule TAKE ONE CAPSULE BY MOUTH EVERY DAY 05/20/15   Kathrynn Ducking, MD  triamcinolone (KENALOG) 0.1 % ointment Apply 1 application topically 2 (two) times daily as needed (itching).     Historical Provider, MD   Allergies  Allergen Reactions  . Milnacipran Swelling and Other (See Comments)    Headache and hand swelling   . Rizatriptan Benzoate Nausea And Vomiting  . Gabapentin Other (See Comments)    Weak muscles  . Moxifloxacin Other (See Comments)    Unknown  . Sulfonamide Derivatives Rash  . Venlafaxine Other (See Comments)    Unknown    Review of Systems + for generalized weakness,  - for current pain, constipation   Physical Exam  weak pale frail elderly lady S1 S2 Lungs diminished Abdomen soft No edema Awake alert  Vital Signs: BP 135/82 mmHg  Pulse 68  Temp(Src) 98.3 F (36.8 C) (Oral)  Resp 20  Ht 5\' 2"  (1.575 m)  Wt 37.1 kg (81 lb  12.7 oz)  BMI 14.96 kg/m2  SpO2 100%  SpO2: SpO2: 100 % O2 Device:SpO2: 100 % O2 Flow Rate: .   IO: Intake/output summary:  Intake/Output Summary (Last 24 hours) at 05/21/15 1222 Last data filed at 05/21/15 0900  Gross per 24 hour  Intake 1913.75 ml  Output      0 ml  Net 1913.75 ml    LBM: Last BM Date: 05/20/15 Baseline Weight: Weight: 37.1 kg (81 lb 12.7 oz) Most recent weight: Weight: 37.1 kg (81 lb 12.7 oz)      Palliative Assessment/Data:  Flowsheet Rows        Most Recent Value   Intake Tab    Referral Department  Hospitalist   Unit at Time of Referral  Med/Surg Unit   Palliative Care Primary Diagnosis  Other (Comment)   Date Notified  05/20/15   Palliative Care Type  New Palliative care   Reason for referral  Clarify Goals of Care, Counsel Regarding Hospice   Date of Admission  05/17/15   Date first seen by Palliative Care  05/21/15   # of days IP prior to Palliative referral  3   Clinical Assessment    Palliative Performance  Scale Score  30%   Pain Max last 24 hours  3   Pain Min Last 24 hours  3   Dyspnea Max Last 24 Hours  2   Dyspnea Min Last 24 hours  2   Psychosocial & Spiritual Assessment    Palliative Care Outcomes    Patient/Family meeting held?  Yes   Who was at the meeting?  patient, husband, caregiver, daughter over the phone.    Palliative Care Outcomes  Clarified goals of care, Counseled regarding hospice   Palliative Care follow-up planned  Yes, Home   Palliative Care Follow-up Reason  Clarify goals of care      Additional Data Reviewed:  CBC:    Component Value Date/Time   WBC 7.6 05/18/2015 0230   WBC 8.7 09/06/2011 1600   WBC 6.8 11/07/2010 1309   HGB 11.7* 05/18/2015 0230   HGB 10.6* 09/06/2011 1600   HGB 10.7* 11/07/2010 1309   HCT 32.1* 05/18/2015 0230   HCT 35.4* 09/06/2011 1600   HCT 32.5* 11/07/2010 1309   PLT 149* 05/18/2015 0230   PLT 190 11/07/2010 1309   MCV 89.7 05/18/2015 0230   MCV 83.4 09/06/2011 1600   MCV  82.7 11/07/2010 1309   NEUTROABS 8.7* 05/17/2015 2310   NEUTROABS 4.4 11/07/2010 1309   LYMPHSABS 1.8 05/17/2015 2310   LYMPHSABS 1.9 11/07/2010 1309   MONOABS 0.9 05/17/2015 2310   MONOABS 0.4 11/07/2010 1309   EOSABS 0.0 05/17/2015 2310   EOSABS 0.1 11/07/2010 1309   BASOSABS 0.0 05/17/2015 2310   BASOSABS 0.0 11/07/2010 1309   Comprehensive Metabolic Panel:    Component Value Date/Time   NA 141 05/20/2015 0752   K 4.5 05/20/2015 0752   CL 123* 05/20/2015 0752   CO2 10* 05/20/2015 0752   BUN 23* 05/20/2015 0752   CREATININE 1.68* 05/20/2015 0752   GLUCOSE 170* 05/20/2015 0752   CALCIUM 8.2* 05/20/2015 0752   AST 17 05/17/2015 2047   ALT 16 05/17/2015 2047   ALKPHOS 110 05/17/2015 2047   BILITOT 0.6 05/17/2015 2047   PROT 5.9* 05/17/2015 2047   ALBUMIN 2.5* 05/17/2015 2047     Time In: 1015 Time Out: 1115 Time Total: 60 Greater than 50%  of this time was spent counseling and coordinating care related to the above assessment and plan.  Signed by: Loistine Chance, MD [id number] Loistine Chance, MD  05/21/2015, 12:22 PM  Please contact Palliative Medicine Team phone at [phone] for questions and concerns.

## 2015-05-22 DIAGNOSIS — R1310 Dysphagia, unspecified: Secondary | ICD-10-CM

## 2015-05-22 DIAGNOSIS — E1049 Type 1 diabetes mellitus with other diabetic neurological complication: Secondary | ICD-10-CM

## 2015-05-22 LAB — GLUCOSE, CAPILLARY
GLUCOSE-CAPILLARY: 163 mg/dL — AB (ref 65–99)
GLUCOSE-CAPILLARY: 178 mg/dL — AB (ref 65–99)
Glucose-Capillary: 124 mg/dL — ABNORMAL HIGH (ref 65–99)

## 2015-05-22 IMAGING — CT CT HEAD W/O CM
1 series · 16 of 30 positions shown, 20 images · non-contrast
Comparison: 10/01/2013

CLINICAL DATA: Seizure, decreased mental status

EXAM:
CT HEAD WITHOUT CONTRAST
TECHNIQUE: Contiguous axial images were obtained from the base of the skull
through the vertex without intravenous contrast.

[Series 2: head 5.0 h30s · axial · 0.41mm/px · z∈[+1251,+1386]mm · 16 of 31 slices shown, 20 images]
[im 2/31  brain]
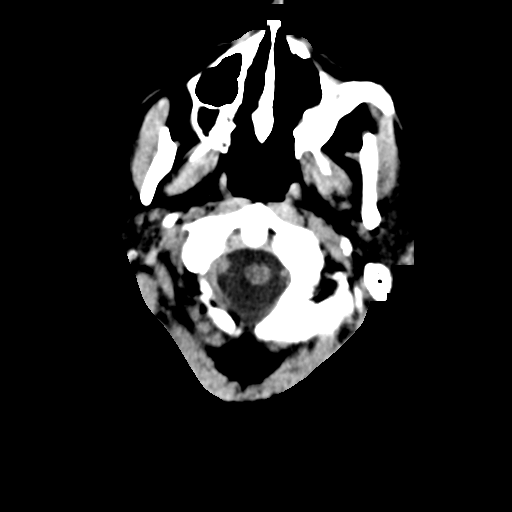
[im 2/31  bone]
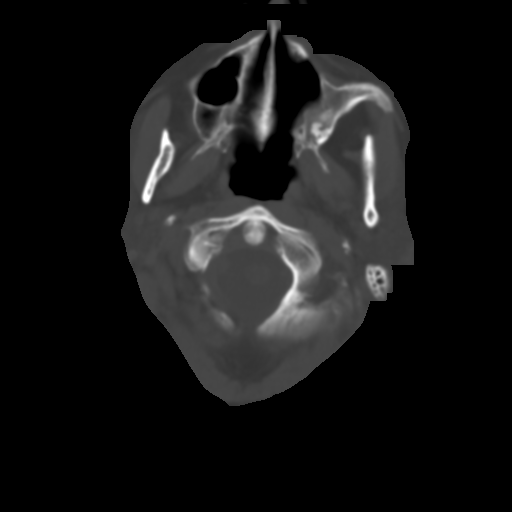
[im 4/31  brain]
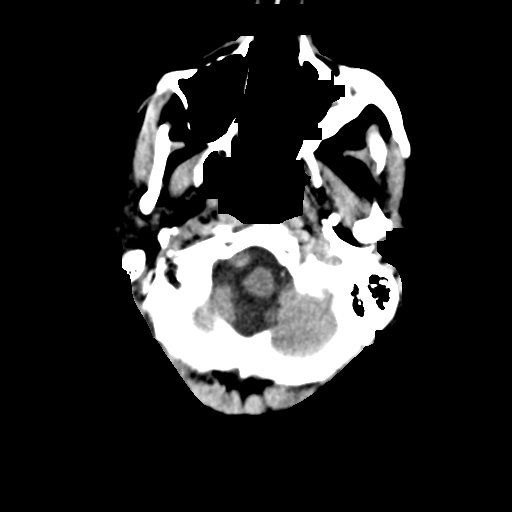
[im 6/31  brain]
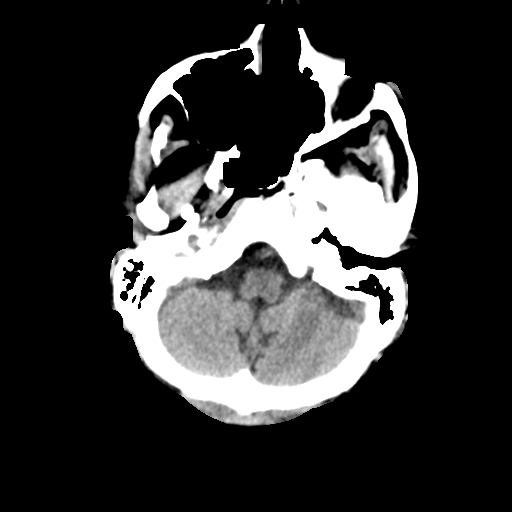
[im 8/31  brain]
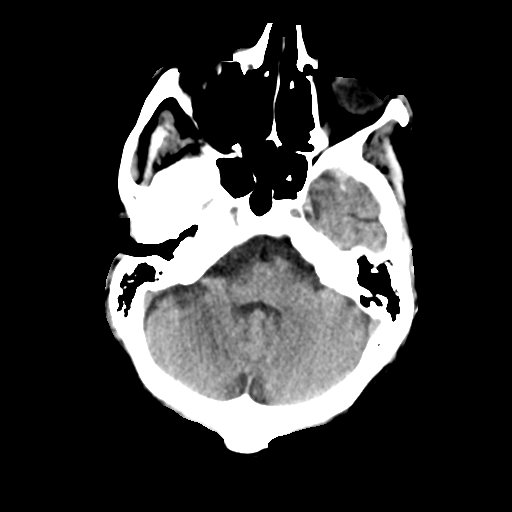
[im 9/31  brain]
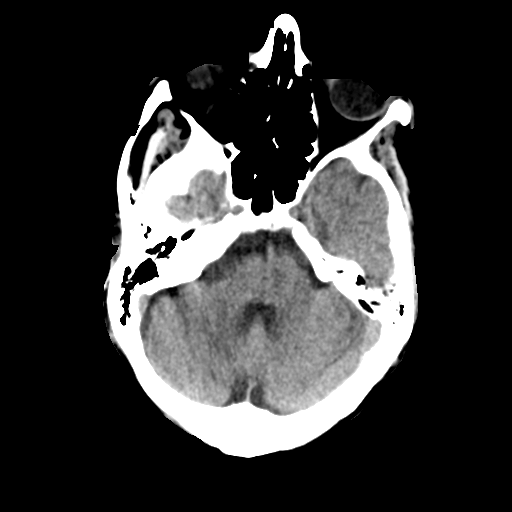
[im 9/31  bone]
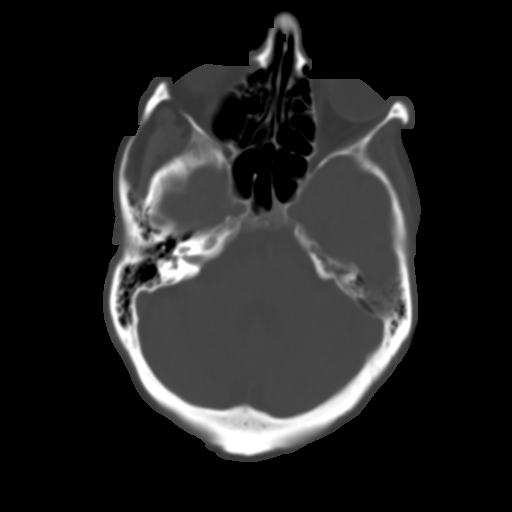
[im 11/31  brain]
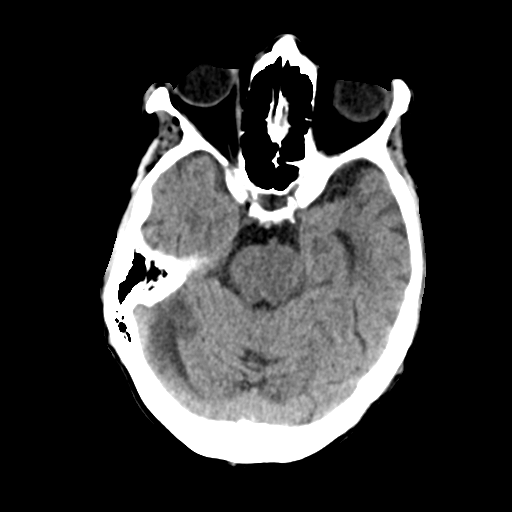
[im 13/31  brain]
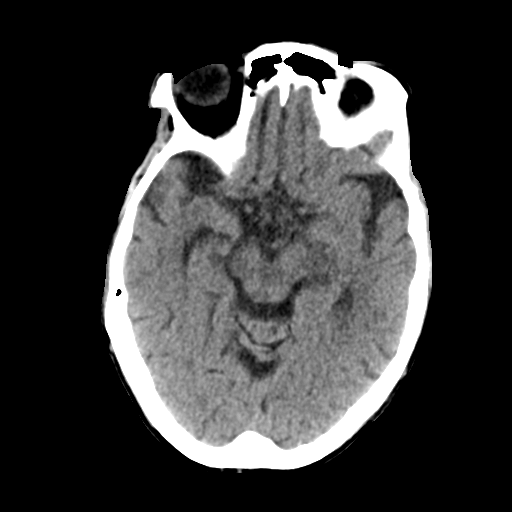
[im 15/31  brain]
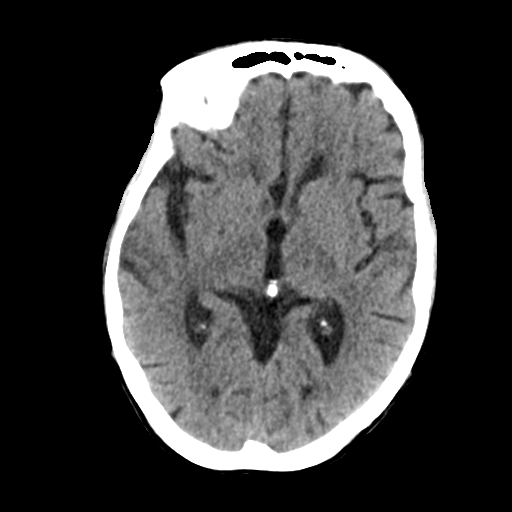
[im 16/31  brain]
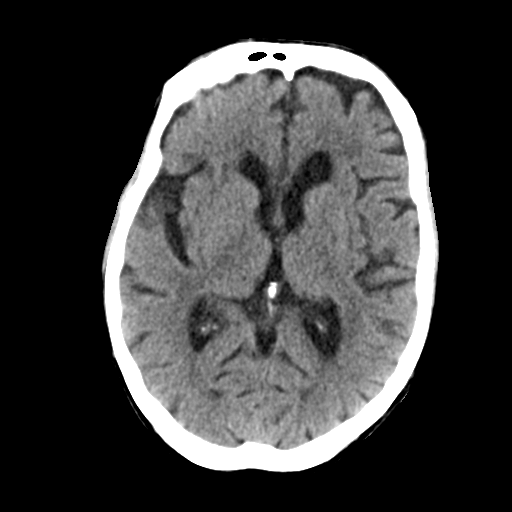
[im 16/31  bone]
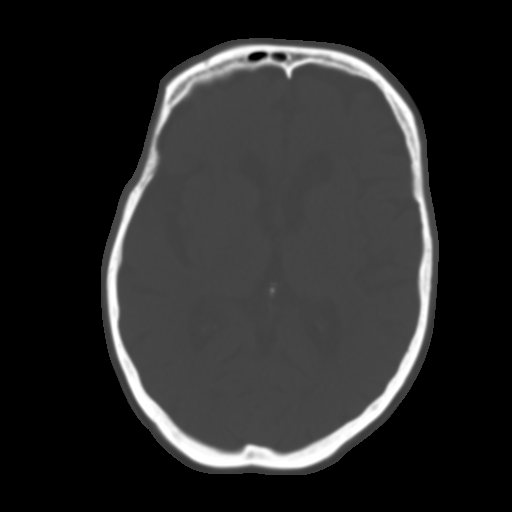
[im 18/31  brain]
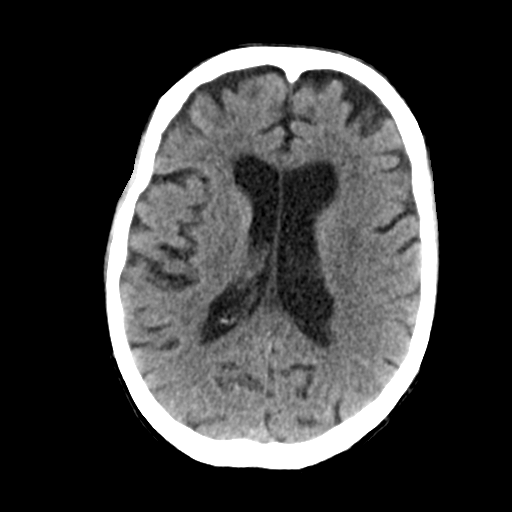
[im 20/31  brain]
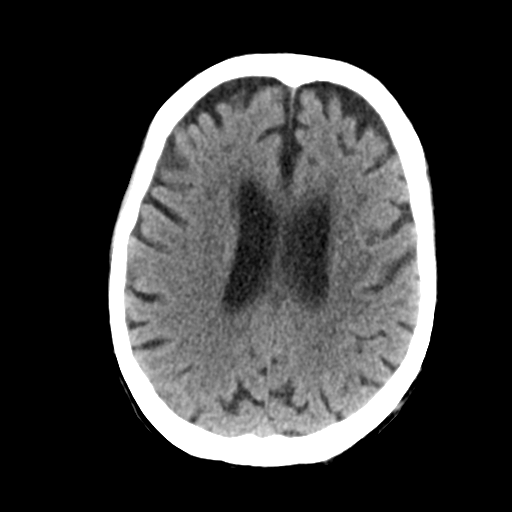
[im 22/31  brain]
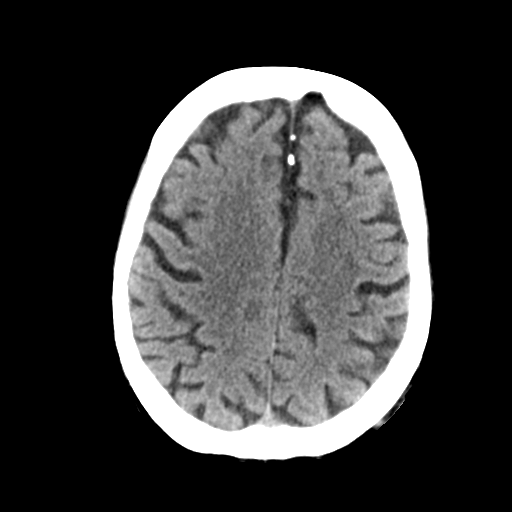
[im 23/31  brain]
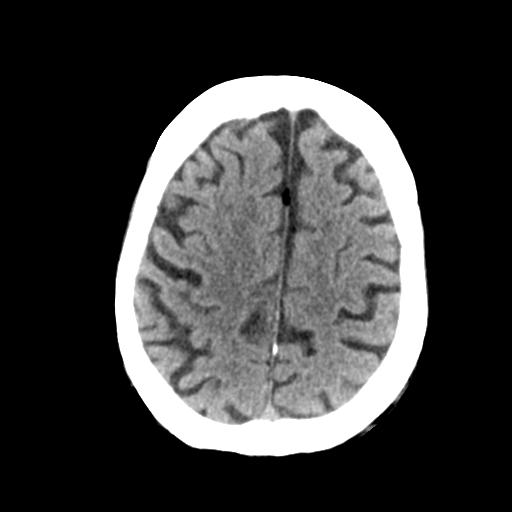
[im 23/31  bone]
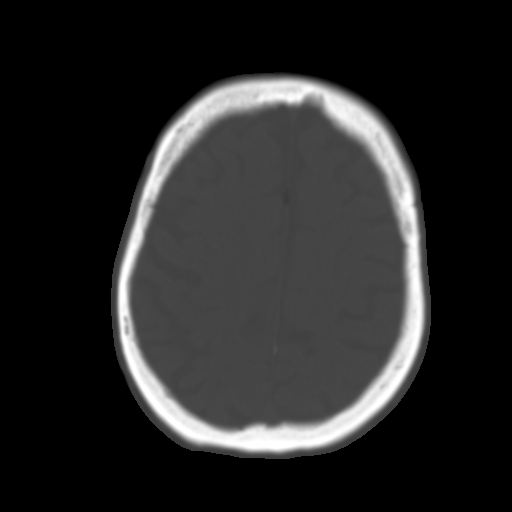
[im 25/31  brain]
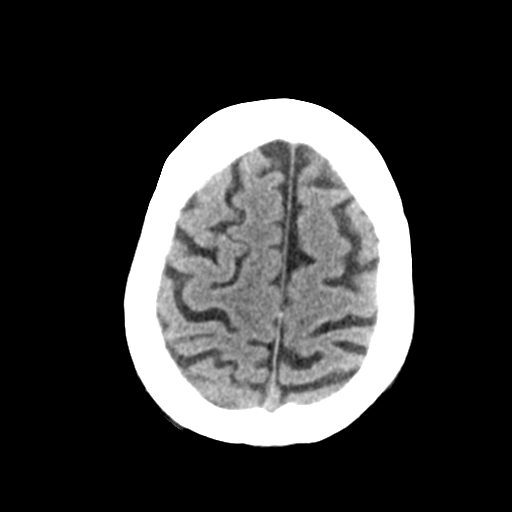
[im 27/31  brain]
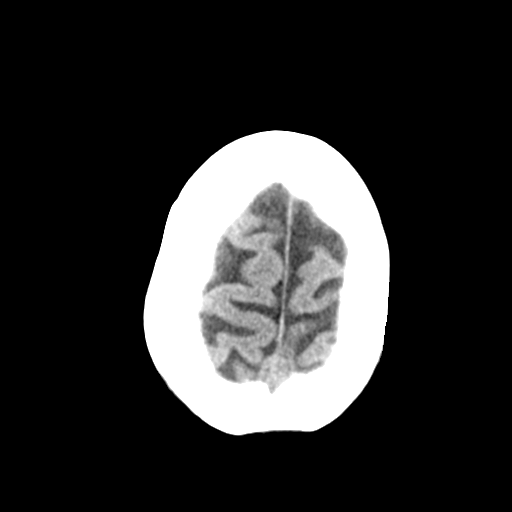
[im 29/31  brain]
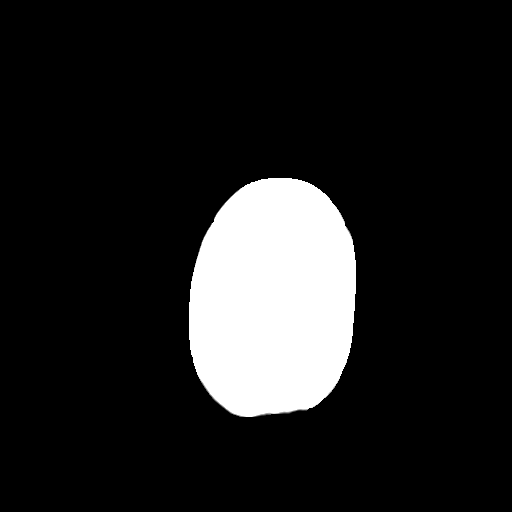

[16 of 30 positions shown; findings below may reference images not displayed]

FINDINGS: No skull fracture is noted. Paranasal sinuses and mastoid air cells
are unremarkable. No intracranial hemorrhage, mass effect or midline
shift. Stable atrophy and chronic white matter disease. No acute
cortical infarction. No mass lesion is noted on this unenhanced
scan.
IMPRESSION: No acute intracranial abnormality. Stable atrophy and chronic white
matter disease.

## 2015-05-22 MED ORDER — LORAZEPAM 1 MG PO TABS
1.0000 mg | ORAL_TABLET | Freq: Three times a day (TID) | ORAL | Status: DC | PRN
  Administered 2015-05-22: 1 mg via ORAL
  Filled 2015-05-22: qty 1

## 2015-05-22 MED ORDER — HYDROCODONE-HOMATROPINE 5-1.5 MG/5ML PO SYRP: 5.0000 mL | ORAL_SOLUTION | Freq: Four times a day (QID) | ORAL | Status: DC | PRN

## 2015-05-22 NOTE — Care Management Important Message (Signed)
Important Message  Patient Details  Name: Lindsey Gomez MRN: [id number] Date of Birth: [birth year]   Medicare Important Message Given:  Yes    Erenest Rasher, RN 05/22/2015, 3:54 PM

## 2015-05-22 NOTE — Care Management Note (Addendum)
Case Management Note  Patient Details  Name: Lindsey Gomez MRN: [id number] Date of Birth: 09-16-1945  Subjective/Objective:     HCAP, nausea, vomiting, chronic pancreatitis               Action/Plan: Discharge Planning: NCM spoke to pt and gave permission to speak to husband, [name]. [place] via phone, # [phone]. Offered choice for Hospice/provided [place] list to pt. Husband agreeable to [place] [facility]. Contacted [facility] and spoke to Prien, Insurance risk surveyor. States [facility] will contact husband and arrange Hospice. Faxed referral to [place]. Pt states he has Caregiver from [facility] that will pick her up from hospital. Aide will be available from 10-1 pm, husband prefers she dc during those times on 05/23/2015. States no DME needed in the home. She has RW at home.   Frederico Hamman MD  Expected Discharge Date:  05/23/2011              Expected Discharge Plan:  Home w Hospice Care  In-House Referral:  NA  Discharge planning Services  CM Consult  Post Acute Care Choice:  Hospice Choice offered to:  Spouse  DME Arranged:  N/A DME Agency:  NA  HH Arranged:  RN [place] Agency:  [facility]  Status of Service:  Completed, signed off  Medicare Important Message Given:  Yes Date Medicare IM Given:    Medicare IM give by:    Date Additional Medicare IM Given:    Additional Medicare Important Message give by:     If discussed at [place] of Stay Meetings, dates discussed:    Additional Comments:  Erenest Rasher, RN 05/22/2015, 4:49 PM

## 2015-05-22 NOTE — Progress Notes (Signed)
[facility] PROGRESS NOTE  WINDY COS [id number] DOB: 1946/02/23 DOA: 05/17/2015 PCP: Mathews Argyle, MD  Assessment/Plan: 1-NV/D: with dehydration, hypernatremia, hypokalemia and metabolic acidosis  -stable overall and per nursing w/o significant diarrhea  -no further vomiting   -will now following [place] meeting transition to full comfort care and symptomatic management  -continue creon  -results from GI panel neg -will continue use PRN loperamide -will use hycodan for coughing spells  -further rec's per palliative care team   2-MSSA HCAP: with concerns for aspiration as well -patient with second admission for PNA -hx of memory impairment and reports from caregiver about throat clearing and coughing spells around meals -will continue mucinex, PRN hycodan and supportive/symptomatic care -will d/c antibiotics -per [place] meeting plan is transitioned to comfort care and arrange for home hospice    3-metabolic acidosis  -due to GI loses, AKI and possible contribution from topamax -per [place], plan is for full comfort care -will discontinue blood draws -minimize oral medications intended to cure and focus on symptomatic management   4-AKI: pre-renal in nature and secondary to GI loses and poor hydration -Cr improved with fluid resuscitation -now plan is for comfort care  -no further blood draws -will follow palliative care rec's on comfort meds  5-type 2 diabetes: insulin dependent  -with ongoing hypoglycemia on admission; required to be started on D5 infusion -will continue holding long acting insulin and now also SSI -will decrease IVF's rate -plan is for comfort management   6-chronic steroids use: presume adrenal insufficiency/crisis  -will discontinue solucortef   7-memory impairment/depression/anxiety -will continue  Namenda, lexapro and ativan -patient also on Brintellix   8-GERD: continue PPI  Code Status: Full Family Communication: discussed with  daughter at bedside Ivin Booty; lives in [place]) on 3/10  Disposition Plan: remains inpatient, following [place] meeting will transition gears to full comfort care and arrange for home hospice, CM consulted    Consultants:  Palliative care  SPL  Procedures:  See below for x-ray reports  Antibiotics:  Vancomycin and cefepime 3/8>>>3/10  Ancef and Flagyl 3/10  HPI/Subjective: Afebrile, still with poor appetite and with increase secretions and still experiencing coughing spells   Objective: Filed Vitals:   05/21/15 2023 05/22/15 0435  BP: 140/84 150/76  Pulse: 68 68  Temp: 98.1 F (36.7 C) 98.2 F (36.8 C)  Resp: 20 18    Intake/Output Summary (Last 24 hours) at 05/22/15 1302 Last data filed at 05/21/15 1700  Gross per 24 hour  Intake 876.25 ml  Output      0 ml  Net 876.25 ml   Filed Weights   05/18/15 0332  Weight: 37.1 kg (81 lb 12.7 oz)    Exam:   General:  Afebrile, still coughing and with increase oral secretions; reports just not feeling good. Oriented X1 mainly. No vomiting reported. Poor appetite.   Cardiovascular: S1 and S2, no rubs, no gallops, no murmur; no JVD on exam  Respiratory: diffuse rhonchi, no wheezing and no crackles  Abdomen: soft, vague tenderness to palpation, no distension   Musculoskeletal: no edema or cyanosis   Data Reviewed: Basic Metabolic Panel:  Recent Labs Lab 05/17/15 2047 05/17/15 2110 05/18/15 0230 05/19/15 0434 05/20/15 0752  NA 145 145 146* 143 141  K 2.1* 2.1* 2.1* 3.2* 4.5  CL 114* 117* 120* 121* 123*  CO2 14*  --  13* 13* 10*  GLUCOSE 173* 164* 187* 296* 170*  BUN 37* 33* 35* 31* 23*  CREATININE 2.71* 2.80* 2.44* 1.80*  1.68*  CALCIUM 8.8*  --  7.9* 8.0* 8.2*  MG 2.0  --   --  1.9  --    Liver Function Tests:  Recent Labs Lab 05/17/15 2047  AST 17  ALT 16  ALKPHOS 110  BILITOT 0.6  PROT 5.9*  ALBUMIN 2.5*    Recent Labs Lab 05/17/15 2047  LIPASE 24   CBC:  Recent Labs Lab  05/17/15 2110 05/17/15 2310 05/18/15 0230  WBC  --  11.4* 7.6  NEUTROABS  --  8.7*  --   HGB 12.6 12.0 11.7*  HCT 37.0 34.4* 32.1*  MCV  --  91.2 89.7  PLT  --  234 149*   CBG:  Recent Labs Lab 05/21/15 1656 05/21/15 2020 05/22/15 0044 05/22/15 0424 05/22/15 0723  GLUCAP 253* 199* 178* 163* 124*    Recent Results (from the past 240 hour(s))  Urine culture     Status: None   Collection Time: 05/17/15  7:01 PM  Result Value Ref Range Status   Specimen Description URINE, CATHETERIZED  Final   Special Requests NONE  Final   Culture   Final    NO GROWTH 1 DAY Performed at [facility]    Report Status 05/19/2015 FINAL  Final  Culture, blood (routine x 2) Call MD if unable to obtain prior to antibiotics being given     Status: None (Preliminary result)   Collection Time: 05/17/15  8:50 PM  Result Value Ref Range Status   Specimen Description BLOOD LEFT WRIST  Final   Special Requests IN PEDIATRIC BOTTLE 3CC  Final   Culture   Final    NO GROWTH 4 DAYS Performed at [facility]    Report Status PENDING  Incomplete  Culture, sputum-assessment     Status: None   Collection Time: 05/18/15  4:57 AM  Result Value Ref Range Status   Specimen Description SPUTUM  Final   Special Requests NONE  Final   Sputum evaluation   Final    THIS SPECIMEN IS ACCEPTABLE. RESPIRATORY CULTURE REPORT TO FOLLOW.   Report Status 05/18/2015 FINAL  Final  Culture, respiratory (NON-Expectorated)     Status: None   Collection Time: 05/18/15  4:57 AM  Result Value Ref Range Status   Specimen Description SPUTUM  Final   Special Requests NONE  Final   Gram Stain   Final    MODERATE WBC PRESENT,BOTH PMN AND MONONUCLEAR RARE SQUAMOUS EPITHELIAL CELLS PRESENT RARE GRAM POSITIVE COCCI IN PAIRS THIS SPECIMEN IS ACCEPTABLE FOR SPUTUM CULTURE Performed at [facility]    Culture   Final    ABUNDANT STAPHYLOCOCCUS AUREUS Note: RIFAMPIN AND GENTAMICIN SHOULD NOT BE USED AS  SINGLE DRUGS FOR TREATMENT OF STAPH INFECTIONS. Performed at [facility]    Report Status 05/20/2015 FINAL  Final   Organism ID, Bacteria STAPHYLOCOCCUS AUREUS  Final      Susceptibility   Staphylococcus aureus - MIC*    CLINDAMYCIN <=0.25 SENSITIVE Sensitive     ERYTHROMYCIN <=0.25 SENSITIVE Sensitive     GENTAMICIN <=0.5 SENSITIVE Sensitive     LEVOFLOXACIN <=0.12 SENSITIVE Sensitive     OXACILLIN 0.5 SENSITIVE Sensitive     RIFAMPIN <=0.5 SENSITIVE Sensitive     TRIMETH/SULFA <=10 SENSITIVE Sensitive     VANCOMYCIN 1 SENSITIVE Sensitive     TETRACYCLINE <=1 SENSITIVE Sensitive     MOXIFLOXACIN <=0.25 SENSITIVE Sensitive     * ABUNDANT STAPHYLOCOCCUS AUREUS  Culture, blood (routine x 2) Call  MD if unable to obtain prior to antibiotics being given     Status: None (Preliminary result)   Collection Time: 05/18/15  4:59 AM  Result Value Ref Range Status   Specimen Description BLOOD LEFT HAND  Final   Special Requests IN PEDIATRIC BOTTLE 2CC  Final   Culture   Final    NO GROWTH 4 DAYS Performed at [facility]    Report Status PENDING  Incomplete  Gastrointestinal Panel by PCR , Stool     Status: None   Collection Time: 05/18/15 12:48 PM  Result Value Ref Range Status   Campylobacter species NOT DETECTED NOT DETECTED Final   Plesimonas shigelloides NOT DETECTED NOT DETECTED Final   Salmonella species NOT DETECTED NOT DETECTED Final   Yersinia enterocolitica NOT DETECTED NOT DETECTED Final   Vibrio species NOT DETECTED NOT DETECTED Final   Vibrio cholerae NOT DETECTED NOT DETECTED Final   Enteroaggregative E coli (EAEC) NOT DETECTED NOT DETECTED Final   Enteropathogenic E coli (EPEC) NOT DETECTED NOT DETECTED Final   Enterotoxigenic E coli (ETEC) NOT DETECTED NOT DETECTED Final   Shiga like toxin producing E coli (STEC) NOT DETECTED NOT DETECTED Final   E. coli O157 NOT DETECTED NOT DETECTED Final   Shigella/Enteroinvasive E coli (EIEC) NOT DETECTED NOT  DETECTED Final   Cryptosporidium NOT DETECTED NOT DETECTED Final   Cyclospora cayetanensis NOT DETECTED NOT DETECTED Final   Entamoeba histolytica NOT DETECTED NOT DETECTED Final   Giardia lamblia NOT DETECTED NOT DETECTED Final   Adenovirus F40/41 NOT DETECTED NOT DETECTED Final   Astrovirus NOT DETECTED NOT DETECTED Final   Norovirus GI/GII NOT DETECTED NOT DETECTED Final   Rotavirus A NOT DETECTED NOT DETECTED Final   Sapovirus (I, II, IV, and V) NOT DETECTED NOT DETECTED Final     Studies: Dg Swallowing Func-speech Pathology  05/20/2015  Objective Swallowing Evaluation: Type of Study: MBS-Modified Barium Swallow Study Patient Details Name: Lindsey Gomez MRN: [id number] Date of Birth: 1945/07/03 Today's Date: 05/20/2015 Time: SLP Start Time (ACUTE ONLY): 1315-SLP Stop Time (ACUTE ONLY): 1330 SLP Time Calculation (min) (ACUTE ONLY): 15 min Past Medical History: Past Medical History Diagnosis Date . Depression  . HTN (hypertension)  . Chronic pain  . Fibromyalgia  . Migraines  . History of breast cancer    Right . Chronic pancreatitis ([place])  . Vitamin D deficiency  . GERD (gastroesophageal reflux disease)  . Finger dislocation 2009   L 3rd . COPD (chronic obstructive pulmonary disease) ([place])  . Anemia  . Fractured sternum 11/2008 . Osteoporosis    Reclast too expensive . Barrett's esophagus  . Chronic fatigue  . Diverticulosis  . Opioid dependence ([place])  . Memory disorder 10/12/2013 . Abnormality of gait 10/12/2013 . Anxiety  . Depression  . Type II or unspecified type diabetes mellitus without mention of complication, not stated as uncontrolled  . Pancreatitis  . Hypoglycemia  . Lung nodules  . Chronic kidney disease  . Parkinson disease ([place])  . Parkinson's disease ([place]) 06/15/2014 . Convulsions/seizures ([place]) 10/12/2013   Possible benzodiazepine withdrawal seizure, x1-no further seizure activity  . Cancer ([place])    HX OF BREAST CANCER -chemo, radiation-right  . Malnourished [facility])  Past Surgical History: Past  Surgical History Procedure Laterality Date . Nasal sinus surgery   . Pancreatectomy     40% . Cholecystectomy   . Appendectomy   . Abdominal hysterectomy   . Lobectomy Left 2005   granulomatous lesion.  . Breast lumpectomy  Left  . Ercp N/A 10/05/2013   Procedure: ENDOSCOPIC RETROGRADE CHOLANGIOPANCREATOGRAPHY (ERCP);  Surgeon: Ladene Artist, MD;  Location: [facility] ENDOSCOPY;  Service: Endoscopy;  Laterality: N/A; . Cataract extraction Bilateral  . Botox injection     recent botox injection 12-23-13 injection for Migraines . Cataract extraction, bilateral Bilateral    bilateral . Eye surgery     1 eye "membrane surgery" . Ercp N/A 01/07/2014   Procedure: ENDOSCOPIC RETROGRADE CHOLANGIOPANCREATOGRAPHY (ERCP);  Surgeon: Inda Castle, MD;  Location: [facility] ENDOSCOPY;  Service: Endoscopy;  Laterality: N/A; . Spyglass cholangioscopy N/A 01/07/2014   Procedure: [id number] CHOLANGIOSCOPY;  Surgeon: Inda Castle, MD;  Location: [facility] ENDOSCOPY;  Service: Endoscopy;  Laterality: N/A; . Colonoscopy   . Upper gastrointestinal endoscopy   HPI: Patient is a 70 y.o. female with PMH: chronic pancreatitis, pancreatic insufficiency, chronic pain, non-compliance with meds, who presented to ED with c/o generalized weakness, nausea, vomiting, diarrhea, and poor PO intake. Patient with recent admission for PNA in January of this year. Subjective: pleasant and cooperative Assessment / Plan / Recommendation CHL IP CLINICAL IMPRESSIONS 05/20/2015 Therapy Diagnosis Mild oral phase dysphagia;Mild pharyngeal phase dysphagia Clinical Impression Patient presents with a mild oral and mild pharyngeal dysphagia. Oral dysphagia is characterized by decreased lingual ROM and ability to transit pill anteriorly to posterior in oral cavity as well as decreased mastication and oral manipulation of hard solid. Patient's pharyngeal dysphagia was characterized by swallow initiation delays to vallecular sinus, one instance of flash penetration of thin liquids via  straw sip, and mild amount of residuals in vallecular sinus with regular, puree and nectar thick liquids, and trace-mild vallecular residuals with thin liquids. Patient exhibited full clearance of pharyngeal residuals with sip of thin liquids.  Impact on safety and function Mild aspiration risk   CHL IP TREATMENT RECOMMENDATION 05/20/2015 Treatment Recommendations Therapy as outlined in treatment plan below   Prognosis 05/20/2015 Prognosis for Safe Diet Advancement Good Barriers to Reach Goals -- Barriers/Prognosis Comment -- CHL IP DIET RECOMMENDATION 05/20/2015 SLP Diet Recommendations Regular solids;Thin liquid Liquid Administration via Cup;Straw Medication Administration Crushed with puree Compensations Minimize environmental distractions;Slow rate;Small sips/bites Postural Changes Remain semi-upright after after feeds/meals (Comment);Seated upright at 90 degrees   CHL IP OTHER RECOMMENDATIONS 05/20/2015 Recommended Consults -- Oral Care Recommendations Oral care BID Other Recommendations --   CHL IP FOLLOW UP RECOMMENDATIONS 05/20/2015 Follow up Recommendations None   CHL IP FREQUENCY AND DURATION 05/20/2015 Speech Therapy Frequency (ACUTE ONLY) min 1 x/week Treatment Duration 1 week      CHL IP ORAL PHASE 05/20/2015 Oral Phase Impaired Oral - Pudding Teaspoon -- Oral - Pudding Cup -- Oral - Honey Teaspoon -- Oral - Honey Cup -- Oral - Nectar Teaspoon -- Oral - Nectar Cup -- Oral - Nectar Straw -- Oral - Thin Teaspoon -- Oral - Thin Cup -- Oral - Thin Straw -- Oral - Puree -- Oral - Mech Soft -- Oral - Regular Impaired mastication;Delayed oral transit Oral - Multi-Consistency -- Oral - Pill Weak lingual manipulation;Other (Comment) Oral Phase - Comment --  CHL IP PHARYNGEAL PHASE 05/20/2015 Pharyngeal Phase Impaired Pharyngeal- Pudding Teaspoon -- Pharyngeal -- Pharyngeal- Pudding Cup -- Pharyngeal -- Pharyngeal- Honey Teaspoon -- Pharyngeal -- Pharyngeal- Honey Cup -- Pharyngeal -- Pharyngeal- Nectar Teaspoon --  Pharyngeal -- Pharyngeal- Nectar Cup Delayed swallow initiation-vallecula;Pharyngeal residue - valleculae Pharyngeal -- Pharyngeal- Nectar Straw -- Pharyngeal -- Pharyngeal- Thin Teaspoon -- Pharyngeal -- Pharyngeal- Thin Cup Delayed swallow initiation-vallecula Pharyngeal -- Pharyngeal- Thin Straw Delayed  swallow initiation-vallecula;Penetration/Aspiration during swallow Pharyngeal Material enters airway, remains ABOVE vocal cords then ejected out Pharyngeal- Puree Delayed swallow initiation-vallecula;Pharyngeal residue - valleculae;Compensatory strategies attempted (with notebox) Pharyngeal -- Pharyngeal- Mechanical Soft -- Pharyngeal -- Pharyngeal- Regular Delayed swallow initiation-vallecula;Pharyngeal residue - valleculae;Compensatory strategies attempted (with notebox) Pharyngeal -- Pharyngeal- Multi-consistency -- Pharyngeal -- Pharyngeal- Pill Other (Comment) Pharyngeal -- Pharyngeal Comment --  CHL IP CERVICAL ESOPHAGEAL PHASE 05/20/2015 Cervical Esophageal Phase WFL Pudding Teaspoon -- Pudding Cup -- Honey Teaspoon -- Honey Cup -- Nectar Teaspoon -- Nectar Cup -- Nectar Straw -- Thin Teaspoon -- Thin Cup -- Thin Straw -- Puree -- Mechanical Soft -- Regular -- Multi-consistency -- Pill -- Cervical Esophageal Comment -- No flowsheet data found. [name] 05/20/2015, 4:03 PM    [name], MA, CCC-SLP 05/20/2015 4:04 PM            Scheduled Meds: . atenolol  50 mg Oral QHS  . escitalopram  10 mg Oral Daily  . feeding supplement (ENSURE ENLIVE)  237 mL Oral BID BM  . fluticasone  2 spray Each Nare Daily  . gabapentin  100 mg Oral BID  . lipase/protease/amylase  12,000 Units Oral TID AC  . LORazepam  1 mg Oral BID  . memantine  14 mg Oral QHS  . pantoprazole  80 mg Oral Daily  . topiramate  100 mg Oral Daily  . Vortioxetine HBr  20 mg Oral QHS   Continuous Infusions: . dextrose 5 % and 0.45 % NaCl with KCl 40 mEq/L 50 mL/hr at 05/22/15 1226    Time spent: 30  minutes    Barton Dubois  [facility] Pager [phone]. If 7PM-7AM, please contact night-coverage at [email or web address], password [facility] 05/22/2015, 1:02 PM  LOS: 4 days

## 2015-05-23 DIAGNOSIS — N179 Acute kidney failure, unspecified: Principal | ICD-10-CM

## 2015-05-23 DIAGNOSIS — R1310 Dysphagia, unspecified: Secondary | ICD-10-CM | POA: Insufficient documentation

## 2015-05-23 DIAGNOSIS — F0281 Dementia in other diseases classified elsewhere with behavioral disturbance: Secondary | ICD-10-CM

## 2015-05-23 DIAGNOSIS — F411 Generalized anxiety disorder: Secondary | ICD-10-CM | POA: Insufficient documentation

## 2015-05-23 DIAGNOSIS — G3183 Dementia with Lewy bodies: Secondary | ICD-10-CM

## 2015-05-23 LAB — CULTURE, BLOOD (ROUTINE X 2)
CULTURE: NO GROWTH
Culture: NO GROWTH

## 2015-05-23 MED ORDER — MEMANTINE HCL ER 14 MG PO CP24: 14.0000 mg | ORAL_CAPSULE | Freq: Every day | ORAL | Status: DC

## 2015-05-23 MED ORDER — ESCITALOPRAM OXALATE 20 MG PO TABS: 10.0000 mg | ORAL_TABLET | Freq: Every day | ORAL | Status: AC

## 2015-05-23 MED ORDER — ATENOLOL 50 MG PO TABS: 50.0000 mg | ORAL_TABLET | Freq: Every day | ORAL | Status: DC

## 2015-05-23 MED ORDER — LOPERAMIDE HCL 2 MG PO CAPS: 2.0000 mg | ORAL_CAPSULE | ORAL | Status: DC | PRN

## 2015-05-23 MED ORDER — HYDROCODONE-HOMATROPINE 5-1.5 MG/5ML PO SYRP: 5.0000 mL | ORAL_SOLUTION | Freq: Four times a day (QID) | ORAL | Status: DC | PRN

## 2015-05-23 MED ORDER — LORAZEPAM 1 MG PO TABS: 1.0000 mg | ORAL_TABLET | Freq: Four times a day (QID) | ORAL | Status: DC | PRN

## 2015-05-23 NOTE — Discharge Summary (Addendum)
Physician Discharge Summary  Lindsey Gomez [id number] DOB: 02-11-1946 DOA: 05/17/2015  PCP: Mathews Argyle, MD  Admit date: 05/17/2015 Discharge date: 05/23/2015  Time spent: 35 minutes  Recommendations for Outpatient Follow-up:  1. 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 Will discharge home with arrangements for hospice service and plans for full comfort care   Diet recommendation: comfort feeding   Filed Weights   05/18/15 0332  Weight: 37.1 kg (81 lb 12.7 oz)    History of present illness:  As per Dr. Alcario Drought H&P written on 05/17/15 70 y.o. female with h/o chronic pancreatitis, pancreatic insufficiency, chronic pain and prior opoid dependence, non-compliance with meds including not taking her pancreatic replacement enzyme. She presents to the ED with c/o generalized weakness per husband. Nausea, vomiting, and diarrhea. Decreased PO intake and decreased UOP.  Unfortunately patient is confused and disoriented to time, not really able to add very much to history.  Of note patient had a nearly identical presentation with N/V/D, questionable PNA on CXR, NAG metabolic acidosis, etc. In Jan of this year.  Hospital Course:  1-NV/D: with dehydration, hypernatremia, hypokalemia and metabolic acidosis  -stable overall and per nursing w/o significant diarrhea  -no further vomiting  -will now following GOC meeting transition  to full comfort care and symptomatic management  -continue creon  -results from GI panel neg -will continue use PRN loperamide -will use hycodan for coughing spells  -further rec's per palliative care team   2-MSSA HCAP/high risk for aspiration: with concerns for aspiration as well -patient with second admission for PNA -hx of memory impairment and reports from caregiver about throat clearing and coughing spells around meals -will continue mucinex, PRN hycodan and supportive/symptomatic care -will d/c antibiotics and per [place] meeting plan is transitioned to full comfort care and arrange for home hospice   3-metabolic acidosis  -due to GI loses, AKI and possible contribution from topamax -per GOC, plan is for full comfort care -will discontinue blood draws -minimize oral medications intended to cure and focus on symptomatic management  -hospice service will continue adjusting medications as needed   4-AKI: pre-renal in nature and secondary to GI loses and poor hydration -Cr improved with fluid resuscitation -plan is for comfort care  -no further blood draws  5-type 2 diabetes: insulin dependent  -with ongoing hypoglycemia on admission; required to be started on D5 infusion and patient is barely eating. -will discontinue home hypoglycemic regimen and CBG's checks -plan is for comfort management   6-chronic steroids use: presume adrenal insufficiency/crisis  -will discontinue solucortef   7-memory impairment/depression/anxiety -will continue Namenda, lexapro, brintellix and ativan  8-GERD: continue PPI  9-severe protein calorie malnutrition -care has been transitioned to full comfort care and comfort feeding -discharge home with hospice.  Procedures:  See below for x-ray reports   Consultations:  Palliative Care   SPL  Discharge Exam: Filed Vitals:   05/22/15 2330 05/23/15 0636  BP: 138/82 137/77  Pulse: 66 65  Temp: 97.7 F (  36.5 C) 98.4 F (36.9 C)   Resp: 26 24    General: Afebrile, still coughing and with increase oral secretions; reports just not feeling good. Oriented X1 mainly. No vomiting reported. Poor appetite continues.   Cardiovascular: S1 and S2, no rubs, no gallops, no murmur; no JVD on exam  Respiratory: diffuse rhonchi, no wheezing and no crackles  Abdomen: soft, vague tenderness to palpation, no distension   Musculoskeletal: no edema or cyanosis   Discharge Instructions   Discharge Instructions    Discharge instructions    Complete by:  As directed   Comfort feeding No further admissions Hospice service to continue adjusting medications as needed for symptomatic management and comfort needs          Current Discharge Medication List    START taking these medications   Details  HYDROcodone-homatropine (HYCODAN) 5-1.5 MG/5ML syrup Take 5 mLs by mouth every 6 (six) hours as needed for cough. Qty: 120 mL, Refills: 0    loperamide (IMODIUM) 2 MG capsule Take 1 capsule (2 mg total) by mouth as needed for diarrhea or loose stools. Qty: 30 capsule, Refills: 0      CONTINUE these medications which have CHANGED   Details  atenolol (TENORMIN) 50 MG tablet Take 1 tablet (50 mg total) by mouth at bedtime.    escitalopram (LEXAPRO) 20 MG tablet Take 0.5 tablets (10 mg total) by mouth daily.   Associated Diagnoses: Generalized anxiety disorder    LORazepam (ATIVAN) 1 MG tablet Take 1 tablet (1 mg total) by mouth every 6 (six) hours as needed for anxiety. Qty: 30 tablet, Refills: 0    memantine (NAMENDA XR) 14 MG CP24 24 hr capsule Take 1 capsule (14 mg total) by mouth at bedtime. Qty: 30 capsule, Refills: 0      CONTINUE these medications which have NOT CHANGED   Details  gabapentin (NEURONTIN) 100 MG capsule TAKE ONE CAPSULE BY MOUTH TWICE DAILY FOR 90 DAYS Refills: 3    hyoscyamine (LEVBID) 0.375 MG 12 hr tablet TAKE ONE TAB TWICE A DAY Qty: 60 tablet, Refills: 1    mometasone (NASONEX) 50 MCG/ACT  nasal spray Place 2 sprays into both nostrils daily as needed (for congestion).     omeprazole (PRILOSEC) 40 MG capsule Take 40 mg by mouth daily.    ondansetron (ZOFRAN) 8 MG tablet TAKE 1 TABLET (8 MG TOTAL) BY MOUTH 2 (TWO) TIMES DAILY. Refills: 3    sertraline (ZOLOFT) 25 MG tablet Take 25 mg by mouth daily.    Vortioxetine HBr (TRINTELLIX) 20 MG TABS Take 20 mg by mouth at bedtime.    acetaminophen (TYLENOL) 500 MG tablet Take 500 mg by mouth every 6 (six) hours as needed.    triamcinolone (KENALOG) 0.1 % ointment Apply 1 application topically 2 (two) times daily as needed (itching).       STOP taking these medications     atorvastatin (LIPITOR) 10 MG tablet      beta carotene w/minerals (OCUVITE) tablet      ferrous sulfate 325 (65 FE) MG tablet      hydrocortisone (CORTEF) 10 MG tablet      insulin aspart (NOVOLOG) 100 UNIT/ML FlexPen      LEVEMIR FLEXTOUCH 100 UNIT/ML Pen      Melatonin 10 MG TABS      Potassium Chloride CR (MICRO-K) 8 MEQ CPCR capsule CR      Probiotic Product (PROBIOTIC DAILY PO)      topiramate (TOPAMAX) 100 MG tablet  vitamin B-12 (CYANOCOBALAMIN) 1000 MCG tablet      zolmitriptan (ZOMIG) 5 MG tablet      ACCU-CHEK AVIVA PLUS test strip      BD PEN NEEDLE NANO U/F 32G X 4 MM MISC      docusate sodium (COLACE) 100 MG capsule      isometheptene-acetaminophen-dichloralphenazone (MIDRIN) 65-100-325 MG capsule      lipase/protease/amylase (CREON) 12000 UNITS CPEP capsule        Allergies  Allergen Reactions  . Milnacipran Swelling and Other (See Comments)    Headache and hand swelling   . Rizatriptan Benzoate Nausea And Vomiting  . Gabapentin Other (See Comments)    Weak muscles  . Moxifloxacin Other (See Comments)    Unknown  . Sulfonamide Derivatives Rash  . Venlafaxine Other (See Comments)    Unknown   Follow-up Information    Follow up with Hospice at [facility].   Specialty:  Hospice and Palliative Medicine   Why:   please contact Hospice when you arrive home.   Contact information:   [place] [address] [phone]       The results of significant diagnostics from this hospitalization (including imaging, microbiology, ancillary and laboratory) are listed below for reference.    Significant Diagnostic Studies: Dg Chest 2 View  05/17/2015  CLINICAL DATA:  Nausea, diarrhea, and decreased urine output for 1 week. Breast carcinoma. EXAM: CHEST  2 VIEW COMPARISON:  03/31/2015 FINDINGS: Severe emphysema is again demonstrated. New left lower lobe airspace disease is seen, suspicious for pneumonia. There is a persistent 3.5 cm nodular opacity in the right perihilar region, suspicious for neoplasm. Pleural-parenchymal scarring also seen in this region. Heart size is within normal limits. Tiny left pleural effusion versus pleural thickening again noted. IMPRESSION: New left lower lobe airspace disease, suspicious for pneumonia. Severe emphysema. Persistent 3.5 cm nodular opacity in right perihilar region, suspicious for neoplasm. Chest CT with contrast is suggested for further evaluation. Electronically Signed   By: Earle Gell M.D.   On: 05/17/2015 17:58   Dg Swallowing Func-speech Pathology  05/20/2015  Objective Swallowing Evaluation: Type of Study: MBS-Modified Barium Swallow Study Patient Details Name: DHALIA KRAJNIK MRN: [id number] Date of Birth: 1945-10-18 Today's Date: 05/20/2015 Time: SLP Start Time (ACUTE ONLY): 1315-SLP Stop Time (ACUTE ONLY): 1330 SLP Time Calculation (min) (ACUTE ONLY): 15 min Past Medical History: Past Medical History Diagnosis Date . Depression  . HTN (hypertension)  . Chronic pain  . Fibromyalgia  . Migraines  . History of breast cancer    Right . Chronic pancreatitis ([place])  . Vitamin D deficiency  . GERD (gastroesophageal reflux disease)  . Finger dislocation 2009   L 3rd . COPD (chronic obstructive pulmonary disease) ([place])  . Anemia  . Fractured sternum 11/2008 .  Osteoporosis    Reclast too expensive . Barrett's esophagus  . Chronic fatigue  . Diverticulosis  . Opioid dependence ([place])  . Memory disorder 10/12/2013 . Abnormality of gait 10/12/2013 . Anxiety  . Depression  . Type II or unspecified type diabetes mellitus without mention of complication, not stated as uncontrolled  . Pancreatitis  . Hypoglycemia  . Lung nodules  . Chronic kidney disease  . Parkinson disease ([place])  . Parkinson's disease ([place]) 06/15/2014 . Convulsions/seizures ([place]) 10/12/2013   Possible benzodiazepine withdrawal seizure, x1-no further seizure activity  . Cancer ([place])    HX OF BREAST CANCER -chemo, radiation-right  . Malnourished [facility])  Past Surgical History: Past Surgical History Procedure Laterality  Date . Nasal sinus surgery   . Pancreatectomy     40% . Cholecystectomy   . Appendectomy   . Abdominal hysterectomy   . Lobectomy Left 2005   granulomatous lesion.  . Breast lumpectomy Left  . Ercp N/A 10/05/2013   Procedure: ENDOSCOPIC RETROGRADE CHOLANGIOPANCREATOGRAPHY (ERCP);  Surgeon: Ladene Artist, MD;  Location: [facility] ENDOSCOPY;  Service: Endoscopy;  Laterality: N/A; . Cataract extraction Bilateral  . Botox injection     recent botox injection 12-23-13 injection for Migraines . Cataract extraction, bilateral Bilateral    bilateral . Eye surgery     1 eye "membrane surgery" . Ercp N/A 01/07/2014   Procedure: ENDOSCOPIC RETROGRADE CHOLANGIOPANCREATOGRAPHY (ERCP);  Surgeon: Inda Castle, MD;  Location: [facility] END[facility];  Service: Endoscopy;  Laterality: N/A; . Spyglass cholangioscopy N/A 01/07/2014   Procedure: [id number] CHOLANGIOSCOPY;  Surgeon: Inda Castle, MD;  Location: [facility] ENDOSCOPY;  Service: Endoscopy;  Laterality: N/A; . Colonoscopy   . Upper gastrointestinal endoscopy   HPI: Patient is a 70 y.o. female with PMH: chronic pancreatitis, pancreatic insufficiency, chronic pain, non-compliance with meds, who presented to ED with c/o generalized weakness, nausea, vomiting, diarrhea, and poor PO intake.  Patient with recent admission for PNA in January of this year. Subjective: pleasant and cooperative Assessment / Plan / Recommendation CHL IP CLINICAL IMPRESSIONS 05/20/2015 Therapy Diagnosis Mild oral phase dysphagia;Mild pharyngeal phase dysphagia Clinical Impression Patient presents with a mild oral and mild pharyngeal dysphagia. Oral dysphagia is characterized by decreased lingual ROM and ability to transit pill anteriorly to posterior in oral cavity as well as decreased mastication and oral manipulation of hard solid. Patient's pharyngeal dysphagia was characterized by swallow initiation delays to vallecular sinus, one instance of flash penetration of thin liquids via straw sip, and mild amount of residuals in vallecular sinus with regular, puree and nectar thick liquids, and trace-mild vallecular residuals with thin liquids. Patient exhibited full clearance of pharyngeal residuals with sip of thin liquids.  Impact on safety and function Mild aspiration risk   CHL IP TREATMENT RECOMMENDATION 05/20/2015 Treatment Recommendations Therapy as outlined in treatment plan below   Prognosis 05/20/2015 Prognosis for Safe Diet Advancement Good Barriers to Reach Goals -- Barriers/Prognosis Comment -- CHL IP DIET RECOMMENDATION 05/20/2015 SLP Diet Recommendations Regular solids;Thin liquid Liquid Administration via Cup;Straw Medication Administration Crushed with puree Compensations Minimize environmental distractions;Slow rate;Small sips/bites Postural Changes Remain semi-upright after after feeds/meals (Comment);Seated upright at 90 degrees   CHL IP OTHER RECOMMENDATIONS 05/20/2015 Recommended Consults -- Oral Care Recommendations Oral care BID Other Recommendations --   CHL IP FOLLOW UP RECOMMENDATIONS 05/20/2015 Follow up Recommendations None   CHL IP FREQUENCY AND DURATION 05/20/2015 Speech Therapy Frequency (ACUTE ONLY) min 1 x/week Treatment Duration 1 week      CHL IP ORAL PHASE 05/20/2015 Oral Phase Impaired Oral -  Pudding Teaspoon -- Oral - Pudding Cup -- Oral - Honey Teaspoon -- Oral - Honey Cup -- Oral - Nectar Teaspoon -- Oral - Nectar Cup -- Oral - Nectar Straw -- Oral - Thin Teaspoon -- Oral - Thin Cup -- Oral - Thin Straw -- Oral - Puree -- Oral - Mech Soft -- Oral - Regular Impaired mastication;Delayed oral transit Oral - Multi-Consistency -- Oral - Pill Weak lingual manipulation;Other (Comment) Oral Phase - Comment --  CHL IP PHARYNGEAL PHASE 05/20/2015 Pharyngeal Phase Impaired Pharyngeal- Pudding Teaspoon -- Pharyngeal -- Pharyngeal- Pudding Cup -- Pharyngeal -- Pharyngeal- Honey Teaspoon -- Pharyngeal -- Pharyngeal- Honey Cup -- Pharyngeal -- Pharyngeal- Nectar  Teaspoon -- Pharyngeal -- Pharyngeal- Nectar Cup Delayed swallow initiation-vallecula;Pharyngeal residue - valleculae Pharyngeal -- Pharyngeal- Nectar Straw -- Pharyngeal -- Pharyngeal- Thin Teaspoon -- Pharyngeal -- Pharyngeal- Thin Cup Delayed swallow initiation-vallecula Pharyngeal -- Pharyngeal- Thin Straw Delayed swallow initiation-vallecula;Penetration/Aspiration during swallow Pharyngeal Material enters airway, remains ABOVE vocal cords then ejected out Pharyngeal- Puree Delayed swallow initiation-vallecula;Pharyngeal residue - valleculae;Compensatory strategies attempted (with notebox) Pharyngeal -- Pharyngeal- Mechanical Soft -- Pharyngeal -- Pharyngeal- Regular Delayed swallow initiation-vallecula;Pharyngeal residue - valleculae;Compensatory strategies attempted (with notebox) Pharyngeal -- Pharyngeal- Multi-consistency -- Pharyngeal -- Pharyngeal- Pill Other (Comment) Pharyngeal -- Pharyngeal Comment --  CHL IP CERVICAL ESOPHAGEAL PHASE 05/20/2015 Cervical Esophageal Phase WFL Pudding Teaspoon -- Pudding Cup -- Honey Teaspoon -- Honey Cup -- Nectar Teaspoon -- Nectar Cup -- Nectar Straw -- Thin Teaspoon -- Thin Cup -- Thin Straw -- Puree -- Mechanical Soft -- Regular -- Multi-consistency -- Pill -- Cervical Esophageal Comment -- No flowsheet data  found. [name] 05/20/2015, 4:03 PM    [name], MA, [place] 05/20/2015 4:04 PM            Microbiology: Recent Results (from the past 240 hour(s))  Urine culture     Status: None   Collection Time: 05/17/15  7:01 PM  Result Value Ref Range Status   Specimen Description URINE, CATHETERIZED  Final   Special Requests NONE  Final   Culture   Final    NO GROWTH 1 DAY Performed at [facility]    Report Status 05/19/2015 FINAL  Final  Culture, blood (routine x 2) Call MD if unable to obtain prior to antibiotics being given     Status: None   Collection Time: 05/17/15  8:50 PM  Result Value Ref Range Status   Specimen Description BLOOD LEFT WRIST  Final   Special Requests IN PEDIATRIC BOTTLE 3CC  Final   Culture   Final    NO GROWTH 5 DAYS Performed at [facility]    Report Status 05/23/2015 FINAL  Final  Culture, sputum-assessment     Status: None   Collection Time: 05/18/15  4:57 AM  Result Value Ref Range Status   Specimen Description SPUTUM  Final   Special Requests NONE  Final   Sputum evaluation   Final    THIS SPECIMEN IS ACCEPTABLE. RESPIRATORY CULTURE REPORT TO FOLLOW.   Report Status 05/18/2015 FINAL  Final  Culture, respiratory (NON-Expectorated)     Status: None   Collection Time: 05/18/15  4:57 AM  Result Value Ref Range Status   Specimen Description SPUTUM  Final   Special Requests NONE  Final   Gram Stain   Final    MODERATE WBC PRESENT,BOTH PMN AND MONONUCLEAR RARE SQUAMOUS EPITHELIAL CELLS PRESENT RARE GRAM POSITIVE COCCI IN PAIRS THIS SPECIMEN IS ACCEPTABLE FOR SPUTUM CULTURE Performed at [facility]    Culture   Final    ABUNDANT STAPHYLOCOCCUS AUREUS Note: RIFAMPIN AND GENTAMICIN SHOULD NOT BE USED AS SINGLE DRUGS FOR TREATMENT OF STAPH INFECTIONS. Performed at [facility]    Report Status 05/20/2015 FINAL  Final   Organism ID, Bacteria STAPHYLOCOCCUS AUREUS  Final      Susceptibility    Staphylococcus aureus - MIC*    CLINDAMYCIN <=0.25 SENSITIVE Sensitive     ERYTHROMYCIN <=0.25 SENSITIVE Sensitive     GENTAMICIN <=0.5 SENSITIVE Sensitive     LEVOFLOXACIN <=0.12 SENSITIVE Sensitive     OXACILLIN 0.5 SENSITIVE Sensitive     RIFAMPIN <=0.5 SENSITIVE Sensitive  TRIMETH/SULFA <=10 SENSITIVE Sensitive     VANCOMYCIN 1 SENSITIVE Sensitive     TETRACYCLINE <=1 SENSITIVE Sensitive     MOXIFLOXACIN <=0.25 SENSITIVE Sensitive     * ABUNDANT STAPHYLOCOCCUS AUREUS  Culture, blood (routine x 2) Call MD if unable to obtain prior to antibiotics being given     Status: None   Collection Time: 05/18/15  4:59 AM  Result Value Ref Range Status   Specimen Description BLOOD LEFT HAND  Final   Special Requests IN PEDIATRIC BOTTLE 2CC  Final   Culture   Final    NO GROWTH 5 DAYS Performed at [facility]    Report Status 05/23/2015 FINAL  Final  Gastrointestinal Panel by PCR , Stool     Status: None   Collection Time: 05/18/15 12:48 PM  Result Value Ref Range Status   Campylobacter species NOT DETECTED NOT DETECTED Final   Plesimonas shigelloides NOT DETECTED NOT DETECTED Final   Salmonella species NOT DETECTED NOT DETECTED Final   Yersinia enterocolitica NOT DETECTED NOT DETECTED Final   Vibrio species NOT DETECTED NOT DETECTED Final   Vibrio cholerae NOT DETECTED NOT DETECTED Final   Enteroaggregative E coli (EAEC) NOT DETECTED NOT DETECTED Final   Enteropathogenic E coli (EPEC) NOT DETECTED NOT DETECTED Final   Enterotoxigenic E coli (ETEC) NOT DETECTED NOT DETECTED Final   Shiga like toxin producing E coli (STEC) NOT DETECTED NOT DETECTED Final   E. coli O157 NOT DETECTED NOT DETECTED Final   Shigella/Enteroinvasive E coli (EIEC) NOT DETECTED NOT DETECTED Final   Cryptosporidium NOT DETECTED NOT DETECTED Final   Cyclospora cayetanensis NOT DETECTED NOT DETECTED Final   Entamoeba histolytica NOT DETECTED NOT DETECTED Final   Giardia lamblia NOT DETECTED NOT DETECTED  Final   Adenovirus F40/41 NOT DETECTED NOT DETECTED Final   Astrovirus NOT DETECTED NOT DETECTED Final   Norovirus GI/GII NOT DETECTED NOT DETECTED Final   Rotavirus A NOT DETECTED NOT DETECTED Final   Sapovirus (I, II, IV, and V) NOT DETECTED NOT DETECTED Final     Labs: Basic Metabolic Panel:  Recent Labs Lab 05/17/15 2047 05/17/15 2110 05/18/15 0230 05/19/15 0434 05/20/15 0752  NA 145 145 146* 143 141  K 2.1* 2.1* 2.1* 3.2* 4.5  CL 114* 117* 120* 121* 123*  CO2 14*  --  13* 13* 10*  GLUCOSE 173* 164* 187* 296* 170*  BUN 37* 33* 35* 31* 23*  CREATININE 2.71* 2.80* 2.44* 1.80* 1.68*  CALCIUM 8.8*  --  7.9* 8.0* 8.2*  MG 2.0  --   --  1.9  --    Liver Function Tests:  Recent Labs Lab 05/17/15 2047  AST 17  ALT 16  ALKPHOS 110  BILITOT 0.6  PROT 5.9*  ALBUMIN 2.5*    Recent Labs Lab 05/17/15 2047  LIPASE 24   CBC:  Recent Labs Lab 05/17/15 2110 05/17/15 2310 05/18/15 0230  WBC  --  11.4* 7.6  NEUTROABS  --  8.7*  --   HGB 12.6 12.0 11.7*  HCT 37.0 34.4* 32.1*  MCV  --  91.2 89.7  PLT  --  234 149*   CBG:  Recent Labs Lab 05/21/15 1656 05/21/15 2020 05/22/15 0044 05/22/15 0424 05/22/15 0723  GLUCAP 253* 199* 178* 163* 124*    Signed:  Barton Dubois MD.  [facility] 05/23/2015, 2:27 PM

## 2015-05-23 NOTE — Progress Notes (Signed)
Notified by Joen Laura of family request for Hospice and [place] services at home after discharge. Chart and patient information reviewed and hospice eligibility confirmed by Dr. Pleas Koch.   Spoke with patient, at bedside and [name] via phone conversation to initiate education related to hospice philosophy, services and team approach to care. Family verbalized understanding of the information provided. Per discussion plan is for discharge to home by personal vehicle with private caregiver today.   Please send signed completed DNR form home with patient.  Patient will need prescriptions for discharge comfort medications.   DME needs discussed and family denied any needs. [facility] aware of the above.   Completed discharge summary will need to be faxed to [facility] at [phone] when final.  Please notify [facility] when patient is ready to leave unit at discharge-call [phone].   [facility] information and contact numbers have been given to patient during visit.   Please call with any questions.  Thank You,  Freddi Starr RN, [facility] Liaison  [phone]

## 2015-05-23 NOTE — Plan of Care (Signed)
Problem: Nutrition: Goal: Adequate nutrition will be maintained Outcome: Adequate for Discharge Pt has been tolerating meal supplement(ensure)

## 2015-05-23 NOTE — Progress Notes (Addendum)
Discharge instructions reviewed with patient and via phone with spouse. Questions, concerns denied. Patient was dressed, personal clothing used. No shoes found in room

## 2015-05-23 NOTE — Progress Notes (Signed)
PT Cancellation Note  Patient Details Name: Lindsey Gomez MRN: [id number] DOB: March 14, 1945   Cancelled Treatment:     pt declined "resting" stated was up earlier.  Plans to D/C to home with Hospice.    Nathanial Rancher 05/23/2015, 2:17 PM

## 2015-05-23 NOTE — Progress Notes (Signed)
Spoke with Lanetta Inch, RN with [facility], concerning pt's discharge to home. Spoke with pt's husband to confirm address to home. Pt transporting home via [facility].

## 2015-05-23 NOTE — Progress Notes (Signed)
Daily Progress Note   Patient Name: Lindsey Gomez       Date: 05/23/2015 DOB: 1946/01/18  Age: 70 y.o. MRN#: [id number] Attending Physician: Barton Dubois, MD Primary Care Physician: Mathews Argyle, MD Admit Date: 05/17/2015  Reason for Consultation/Follow-up: Establishing goals of care  Subjective: Awake alert, resting in bed, asking for coffee, no family at bedside, patient in no distress.    Interval Events: Appreciate case management/SW assistance, hospice arrangements underway,  needs portable DNR on D/C  Recommendations: Agree with home with hospice on D/C Agree with continuing Namenda, Lexapro and Ativan for symptom management for now.  Agree with holding insulin preparations, patient with erratic minimal to moderate PO intake, hold all DM medications, ok with having some high blood sugars so as to avoid risk for hypoglycemia.  PRN Loperamide for diarrhea Mucinex, hycodan for cough, rest per hospice at home to start opioids for symptom management.    Length of Stay: 5 days  Current Medications: Scheduled Meds:  . atenolol  50 mg Oral QHS  . escitalopram  10 mg Oral Daily  . feeding supplement (ENSURE ENLIVE)  237 mL Oral BID BM  . fluticasone  2 spray Each Nare Daily  . gabapentin  100 mg Oral BID  . lipase/protease/amylase  12,000 Units Oral TID AC  . memantine  14 mg Oral QHS  . pantoprazole  80 mg Oral Daily  . topiramate  100 mg Oral Daily  . Vortioxetine HBr  20 mg Oral QHS    Continuous Infusions: . dextrose 5 % and 0.45 % NaCl with KCl 40 mEq/L 20 mL/hr at 05/22/15 1358    PRN Meds: acetaminophen, guaiFENesin-dextromethorphan, HYDROcodone-homatropine, loperamide, LORazepam, ondansetron (ZOFRAN) IV, ondansetron, prochlorperazine  Physical  Exam: Physical Exam             Weak pale elderly lady In no distress Diminished S1 S2 Abdomen soft No edema  Vital Signs: BP 137/77 mmHg  Pulse 65  Temp(Src) 98.4 F (36.9 C) (Oral)  Resp 24  Ht 5\' 2"  (1.575 m)  Wt 37.1 kg (81 lb 12.7 oz)  BMI 14.96 kg/m2  SpO2 97% SpO2: SpO2: 97 % O2 Device: O2 Device: Not Delivered O2 Flow Rate:    Intake/output summary:  Intake/Output Summary (Last 24 hours) at 05/23/15 1026 Last data filed at 05/22/15  2300  Gross per 24 hour  Intake    650 ml  Output      0 ml  Net    650 ml   LBM: Last BM Date: 05/21/15 Baseline Weight: Weight: 37.1 kg (81 lb 12.7 oz) Most recent weight: Weight: 37.1 kg (81 lb 12.7 oz)       Palliative Assessment/Data: Flowsheet Rows        Most Recent Value   Intake Tab    Referral Department  Hospitalist   Unit at Time of Referral  Med/Surg Unit   Palliative Care Primary Diagnosis  Neurology   Date Notified  05/20/15   Palliative Care Type  Return patient Palliative Care   Reason for referral  Clarify Goals of Care, Counsel Regarding Hospice   Date of Admission  05/17/15   Date first seen by Palliative Care  05/21/15   # of days IP prior to Palliative referral  3   Clinical Assessment    Palliative Performance Scale Score  30%   Pain Max last 24 hours  4   Pain Min Last 24 hours  3   Dyspnea Max Last 24 Hours  3   Dyspnea Min Last 24 hours  2   Psychosocial & Spiritual Assessment    Palliative Care Outcomes    Patient/Family meeting held?  Yes   Who was at the meeting?  patient, husband, caregiver, daughter over the phone.    Palliative Care Outcomes  Clarified goals of care, Counseled regarding hospice   Palliative Care follow-up planned  Yes, Home   Palliative Care Follow-up Reason  Clarify goals of care      Additional Data Reviewed: CBC    Component Value Date/Time   WBC 7.6 05/18/2015 0230   WBC 8.7 09/06/2011 1600   WBC 6.8 11/07/2010 1309   RBC 3.58* 05/18/2015 0230   RBC 4.24  09/06/2011 1600   RBC 3.93 11/07/2010 1309   HGB 11.7* 05/18/2015 0230   HGB 10.6* 09/06/2011 1600   HGB 10.7* 11/07/2010 1309   HCT 32.1* 05/18/2015 0230   HCT 35.4* 09/06/2011 1600   HCT 32.5* 11/07/2010 1309   PLT 149* 05/18/2015 0230   PLT 190 11/07/2010 1309   MCV 89.7 05/18/2015 0230   MCV 83.4 09/06/2011 1600   MCV 82.7 11/07/2010 1309   MCH 32.7 05/18/2015 0230   MCH 25.0* 09/06/2011 1600   MCH 27.2 11/07/2010 1309   MCHC 36.4* 05/18/2015 0230   MCHC 29.9* 09/06/2011 1600   MCHC 32.9 11/07/2010 1309   RDW 15.3 05/18/2015 0230   RDW 17.5* 11/07/2010 1309   LYMPHSABS 1.8 05/17/2015 2310   LYMPHSABS 1.9 11/07/2010 1309   MONOABS 0.9 05/17/2015 2310   MONOABS 0.4 11/07/2010 1309   EOSABS 0.0 05/17/2015 2310   EOSABS 0.1 11/07/2010 1309   BASOSABS 0.0 05/17/2015 2310   BASOSABS 0.0 11/07/2010 1309    CMP     Component Value Date/Time   NA 141 05/20/2015 0752   K 4.5 05/20/2015 0752   CL 123* 05/20/2015 0752   CO2 10* 05/20/2015 0752   GLUCOSE 170* 05/20/2015 0752   BUN 23* 05/20/2015 0752   CREATININE 1.68* 05/20/2015 0752   CALCIUM 8.2* 05/20/2015 0752   PROT 5.9* 05/17/2015 2047   ALBUMIN 2.5* 05/17/2015 2047   AST 17 05/17/2015 2047   ALT 16 05/17/2015 2047   ALKPHOS 110 05/17/2015 2047   BILITOT 0.6 05/17/2015 2047   GFRNONAA 30* 05/20/2015 0752   GFRAA 35* 05/20/2015  0752       Problem List:  Patient Active Problem List   Diagnosis Date Noted  . Encounter for palliative care   . Goals of care, counseling/discussion   . Lewy body dementia without behavioral disturbance   . Metabolic acidosis   . Nausea vomiting and diarrhea 05/18/2015  . Pressure ulcer 04/01/2015  . AKI (acute kidney injury) ([place]) 03/31/2015  . Urinary tract infection 03/31/2015  . Nausea & vomiting 03/31/2015  . Leukocytosis 03/31/2015  . Abdominal pain 03/31/2015  . Bilateral pulmonary infiltrates on CXR 07/21/2014  . Abdominal pain, chronic, right lower quadrant  06/24/2014  . Parkinson's disease ([place]) 06/15/2014  . Intractable chronic migraine without aura and without status migrainosus 04/21/2014  . Retention of urine   . HCAP (healthcare-associated pneumonia) 04/09/2014  . Thrombocytopenia ([place]) 04/09/2014  . Severe protein-calorie malnutrition ([place]) 04/09/2014  . Memory impairment 04/09/2014  . Chronic migraine 04/09/2014  . Hypokalemia 04/09/2014  . Acute renal failure ([place]) 03/11/2014  . DM2 (diabetes mellitus, type 2) ([place]) 03/11/2014  . ARF (acute renal failure) ([place]) 03/11/2014  . Type 2 diabetes mellitus with hyperosmolarity without nonketotic hyperglycemic-hyperosmolar coma (nkhhc) ([place]) 03/11/2014  . Diarrhea 01/28/2014  . Bile duct stricture 01/07/2014  . Bile duct stone 01/07/2014  . Internal hemorrhoids 12/30/2013  . History of breast cancer 12/30/2013  . Convulsions/seizures ([place]) 10/12/2013  . 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 Abnormality of gait 10/12/2013  . Common bile duct (CBD) stricture 10/05/2013  . Nonspecific elevation of levels of transaminase or lactic acid dehydrogenase (LDH) 10/05/2013  . Protein-calorie malnutrition, severe ([place]) 10/02/2013  . Dilated bile duct 10/02/2013  . Mental status change 10/01/2013  . Dehydration 10/01/2013  . Productive cough 10/01/2013  . Toxic metabolic encephalopathy 99991111  . Nausea with vomiting 09/10/2013  . UTI (urinary tract infection) 09/10/2013  . Acute bronchitis 07/17/2013  . 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 Unspecified constipation 05/15/2013  . Arm wound 08/16/2011  . Opioid dependence ([place]) 07/17/2011  . Intractable chronic migraine without aura 07/16/2011  . Breast lump in female 07/16/2011  . Bursitis of left hip 06/05/2011  . Knee pain, left 06/05/2011  . Hypoglycemia 04/24/2011  . Chest wall contusion 03/23/2011  . Blood in stool 10/02/2010  . Wart viral 09/29/2010  . Edema 09/01/2010  . Falls frequently  09/01/2010  . Dizzy spells 09/01/2010  . Barrett's esophagus 08/14/2010  . Personal history of colonic polyps 08/14/2010  . GERD (gastroesophageal reflux disease) 07/18/2010  . HYPERTRICHOSIS 05/19/2010  . MENTAL CONFUSION 11/29/2009  . SCAR 08/29/2009  . TRAUMAT HEMOTHOR W/O MENTION OPEN WOUND IN [facility] 02/16/2009  . CHEST PAIN 02/14/2009  . CLOSED FRACTURE OF MULTIPLE RIBS UNSPECIFIED 02/14/2009  . PNEUMOTHORAX, TRAUMATIC 02/14/2009  . FRACTURE, STERNUM 12/02/2008  . SOMNOLENCE 07/23/2008  . FOOT ULCER 05/25/2008  . OSTEOPOROSIS 04/19/2008  . FRACTURE, RIB, LEFT 04/19/2008  . HYPOKALEMIA 04/13/2008  . CHEST WALL PAIN 04/13/2008  . THRUSH 01/22/2008  . TOBACCO USE DISORDER/SMOKER-SMOKING CESSATION DISCUSSED 09/25/2007  . ANEMIA-NOS 05/09/2007  . Anxiety state 05/09/2007  . COPD 05/09/2007  . Headache(784.0) 05/06/2007  . UNS ADVRS EFF UNS RX MEDICINAL&BIOLOGICAL SBSTNC 05/06/2007  . SINUSITIS, ACUTE 04/11/2007  . DISORDER, ADRENAL NEC 12/11/2006  . VITAMIN D DEFICIENCY 12/11/2006  . Variants of migraine, not elsewhere classified, with intractable migraine, so stated 12/11/2006  . PANCREATITIS, CHRONIC 12/11/2006  . FIBROMYALGIA 12/11/2006  . SYNCOPE 12/11/2006  . FATIGUE 12/11/2006  . Type 1 diabetes mellitus (  [place]) 08/07/2006  . DEPRESSION 08/07/2006  . Essential hypertension 08/07/2006  . Personal history of malignant neoplasm of breast 12/10/2005     Palliative Care Assessment & Plan    1.Code Status:  DNR    Code Status Orders        Start     Ordered   05/21/15 1048  Do not attempt resuscitation (DNR)   Continuous    Question Answer Comment  In the event of cardiac or respiratory ARREST Do not call a "code blue"   In the event of cardiac or respiratory ARREST Do not perform Intubation, CPR, defibrillation or ACLS   In the event of cardiac or respiratory ARREST Use medication by any route, position, wound care, and other measures to relive pain and  suffering. May use oxygen, suction and manual treatment of airway obstruction as needed for comfort.      05/21/15 1047    Code Status History    Date Active Date Inactive Code Status Order ID Comments User Context   05/18/2015 12:47 AM 05/21/2015 10:47 AM Full Code [id number]  Etta Quill, DO ED   03/31/2015  3:07 AM 04/06/2015  5:00 PM Full Code [id number]  Norval Morton, MD ED   04/09/2014  1:11 PM 04/15/2014  6:46 PM Full Code [id number]  Louellen Molder, MD Inpatient   03/11/2014  6:02 PM 03/15/2014  4:42 PM DNR [id number]  Thurnell Lose, MD Inpatient   10/01/2013  5:00 PM 10/07/2013  4:03 PM Full Code [id number]  Nita Sells, MD Inpatient       2. Goals of Care/Additional Recommendations:  Limitations on Scope of Treatment: Full Comfort Care  Desire for further Chaplaincy support:no  Psycho-social Needs: Education on Hospice  3. Symptom Management:      1. As above   4. Palliative Prophylaxis:   Delirium Protocol  5. Prognosis: < 3 months  6. Discharge Planning:  Home with Hospice   Care plan was discussed with patient this am, family meeting on 05-21-15.  Thank you for allowing the Palliative Medicine Team to assist in the care of this patient.   Time In: 9 Time Out: 925 Total Time 25 Prolonged Time Billed  no        [phone]  Loistine Chance, MD  05/23/2015, 10:26 AM  Please contact Palliative Medicine Team phone at [phone] for questions and concerns.

## 2015-05-26 ENCOUNTER — Ambulatory Visit (HOSPITAL_COMMUNITY): Payer: Self-pay | Admitting: Psychiatry

## 2015-06-05 ENCOUNTER — Other Ambulatory Visit (HOSPITAL_COMMUNITY): Payer: Self-pay | Admitting: Psychiatry

## 2015-06-06 ENCOUNTER — Inpatient Hospital Stay (HOSPITAL_COMMUNITY)
Admission: EM | Admit: 2015-06-06 | Discharge: 2015-06-08 | DRG: 637 | Disposition: A | Discharge status: Hospice | Attending: Internal Medicine | Admitting: Internal Medicine

## 2015-06-06 ENCOUNTER — Emergency Department (HOSPITAL_COMMUNITY)

## 2015-06-06 ENCOUNTER — Encounter (HOSPITAL_COMMUNITY): Payer: Self-pay

## 2015-06-06 DIAGNOSIS — Z515 Encounter for palliative care: Secondary | ICD-10-CM | POA: Diagnosis not present

## 2015-06-06 DIAGNOSIS — Z833 Family history of diabetes mellitus: Secondary | ICD-10-CM | POA: Diagnosis not present

## 2015-06-06 DIAGNOSIS — Z79899 Other long term (current) drug therapy: Secondary | ICD-10-CM

## 2015-06-06 DIAGNOSIS — Z8249 Family history of ischemic heart disease and other diseases of the circulatory system: Secondary | ICD-10-CM | POA: Diagnosis not present

## 2015-06-06 DIAGNOSIS — I10 Essential (primary) hypertension: Secondary | ICD-10-CM | POA: Diagnosis not present

## 2015-06-06 DIAGNOSIS — G9340 Encephalopathy, unspecified: Secondary | ICD-10-CM | POA: Diagnosis present

## 2015-06-06 DIAGNOSIS — R031 Nonspecific low blood-pressure reading: Secondary | ICD-10-CM

## 2015-06-06 DIAGNOSIS — F411 Generalized anxiety disorder: Secondary | ICD-10-CM | POA: Diagnosis present

## 2015-06-06 DIAGNOSIS — E43 Unspecified severe protein-calorie malnutrition: Secondary | ICD-10-CM | POA: Diagnosis present

## 2015-06-06 DIAGNOSIS — R680 Hypothermia, not associated with low environmental temperature: Secondary | ICD-10-CM | POA: Diagnosis present

## 2015-06-06 DIAGNOSIS — R627 Adult failure to thrive: Secondary | ICD-10-CM | POA: Diagnosis not present

## 2015-06-06 DIAGNOSIS — E876 Hypokalemia: Secondary | ICD-10-CM | POA: Diagnosis present

## 2015-06-06 DIAGNOSIS — I129 Hypertensive chronic kidney disease with stage 1 through stage 4 chronic kidney disease, or unspecified chronic kidney disease: Secondary | ICD-10-CM | POA: Diagnosis present

## 2015-06-06 DIAGNOSIS — E10649 Type 1 diabetes mellitus with hypoglycemia without coma: Principal | ICD-10-CM | POA: Diagnosis present

## 2015-06-06 DIAGNOSIS — N183 Chronic kidney disease, stage 3 (moderate): Secondary | ICD-10-CM | POA: Diagnosis present

## 2015-06-06 DIAGNOSIS — G20 Parkinson's disease: Secondary | ICD-10-CM | POA: Diagnosis present

## 2015-06-06 DIAGNOSIS — E162 Hypoglycemia, unspecified: Secondary | ICD-10-CM | POA: Diagnosis not present

## 2015-06-06 DIAGNOSIS — Y95 Nosocomial condition: Secondary | ICD-10-CM | POA: Diagnosis present

## 2015-06-06 DIAGNOSIS — K219 Gastro-esophageal reflux disease without esophagitis: Secondary | ICD-10-CM | POA: Diagnosis present

## 2015-06-06 DIAGNOSIS — E1022 Type 1 diabetes mellitus with diabetic chronic kidney disease: Secondary | ICD-10-CM | POA: Diagnosis present

## 2015-06-06 DIAGNOSIS — F329 Major depressive disorder, single episode, unspecified: Secondary | ICD-10-CM | POA: Diagnosis present

## 2015-06-06 DIAGNOSIS — Z66 Do not resuscitate: Secondary | ICD-10-CM | POA: Diagnosis present

## 2015-06-06 DIAGNOSIS — I959 Hypotension, unspecified: Secondary | ICD-10-CM | POA: Diagnosis present

## 2015-06-06 DIAGNOSIS — G92 Toxic encephalopathy: Secondary | ICD-10-CM | POA: Diagnosis present

## 2015-06-06 DIAGNOSIS — M797 Fibromyalgia: Secondary | ICD-10-CM | POA: Diagnosis present

## 2015-06-06 DIAGNOSIS — Z7952 Long term (current) use of systemic steroids: Secondary | ICD-10-CM

## 2015-06-06 DIAGNOSIS — F17210 Nicotine dependence, cigarettes, uncomplicated: Secondary | ICD-10-CM | POA: Diagnosis present

## 2015-06-06 DIAGNOSIS — J189 Pneumonia, unspecified organism: Secondary | ICD-10-CM | POA: Diagnosis present

## 2015-06-06 DIAGNOSIS — K861 Other chronic pancreatitis: Secondary | ICD-10-CM | POA: Diagnosis present

## 2015-06-06 DIAGNOSIS — Z853 Personal history of malignant neoplasm of breast: Secondary | ICD-10-CM | POA: Diagnosis not present

## 2015-06-06 DIAGNOSIS — F0390 Unspecified dementia without behavioral disturbance: Secondary | ICD-10-CM | POA: Diagnosis present

## 2015-06-06 DIAGNOSIS — J440 Chronic obstructive pulmonary disease with acute lower respiratory infection: Secondary | ICD-10-CM | POA: Diagnosis present

## 2015-06-06 DIAGNOSIS — R112 Nausea with vomiting, unspecified: Secondary | ICD-10-CM

## 2015-06-06 DIAGNOSIS — Z681 Body mass index (BMI) 19 or less, adult: Secondary | ICD-10-CM | POA: Diagnosis not present

## 2015-06-06 DIAGNOSIS — E2740 Unspecified adrenocortical insufficiency: Secondary | ICD-10-CM | POA: Diagnosis present

## 2015-06-06 DIAGNOSIS — E109 Type 1 diabetes mellitus without complications: Secondary | ICD-10-CM | POA: Diagnosis present

## 2015-06-06 DIAGNOSIS — Z794 Long term (current) use of insulin: Secondary | ICD-10-CM

## 2015-06-06 DIAGNOSIS — R748 Abnormal levels of other serum enzymes: Secondary | ICD-10-CM | POA: Diagnosis present

## 2015-06-06 DIAGNOSIS — E108 Type 1 diabetes mellitus with unspecified complications: Secondary | ICD-10-CM

## 2015-06-06 DIAGNOSIS — R413 Other amnesia: Secondary | ICD-10-CM

## 2015-06-06 LAB — CBC WITH DIFFERENTIAL/PLATELET
BASOS ABS: 0 10*3/uL (ref 0.0–0.1)
BASOS PCT: 0 %
EOS PCT: 0 %
Eosinophils Absolute: 0 10*3/uL (ref 0.0–0.7)
HEMATOCRIT: 29.5 % — AB (ref 36.0–46.0)
HEMOGLOBIN: 10.5 g/dL — AB (ref 12.0–15.0)
LYMPHS ABS: 3 10*3/uL (ref 0.7–4.0)
Lymphocytes Relative: 26 %
MCH: 32.2 pg (ref 26.0–34.0)
MCHC: 35.6 g/dL (ref 30.0–36.0)
MCV: 90.5 fL (ref 78.0–100.0)
MONOS PCT: 5 %
Monocytes Absolute: 0.6 10*3/uL (ref 0.1–1.0)
NEUTROS ABS: 7.9 10*3/uL — AB (ref 1.7–7.7)
Neutrophils Relative %: 69 %
Platelets: 277 10*3/uL (ref 150–400)
RBC: 3.26 MIL/uL — ABNORMAL LOW (ref 3.87–5.11)
RDW: 14.7 % (ref 11.5–15.5)
WBC: 11.5 10*3/uL — ABNORMAL HIGH (ref 4.0–10.5)

## 2015-06-06 LAB — I-STAT CG4 LACTIC ACID, ED: Lactic Acid, Venous: 0.66 mmol/L (ref 0.5–2.0)

## 2015-06-06 LAB — MAGNESIUM: Magnesium: 1.7 mg/dL (ref 1.7–2.4)

## 2015-06-06 LAB — COMPREHENSIVE METABOLIC PANEL
ALBUMIN: 1.5 g/dL — AB (ref 3.5–5.0)
ALK PHOS: 129 U/L — AB (ref 38–126)
ALT: 34 U/L (ref 14–54)
AST: 73 U/L — AB (ref 15–41)
Anion gap: 9 (ref 5–15)
BILIRUBIN TOTAL: 0.4 mg/dL (ref 0.3–1.2)
BUN: 48 mg/dL — AB (ref 6–20)
CALCIUM: 6.8 mg/dL — AB (ref 8.9–10.3)
CO2: 17 mmol/L — AB (ref 22–32)
CREATININE: 1.31 mg/dL — AB (ref 0.44–1.00)
Chloride: 114 mmol/L — ABNORMAL HIGH (ref 101–111)
GFR calc Af Amer: 47 mL/min — ABNORMAL LOW (ref >60)
GFR calc non Af Amer: 41 mL/min — ABNORMAL LOW (ref >60)
GLUCOSE: 34 mg/dL — AB (ref 65–99)
Potassium: <2 mmol/L — CL (ref 3.5–5.1)
SODIUM: 140 mmol/L (ref 135–145)
TOTAL PROTEIN: 4.7 g/dL — AB (ref 6.5–8.1)

## 2015-06-06 LAB — PROCALCITONIN: Procalcitonin: 0.1 ng/mL

## 2015-06-06 LAB — CBG MONITORING, ED
GLUCOSE-CAPILLARY: 41 mg/dL — AB (ref 65–99)
Glucose-Capillary: 92 mg/dL (ref 65–99)
Glucose-Capillary: 68 mg/dL (ref 65–99)

## 2015-06-06 LAB — LIPASE, BLOOD: Lipase: 18 U/L (ref 11–51)

## 2015-06-06 LAB — POTASSIUM: Potassium: 2.4 mmol/L — CL (ref 3.5–5.1)

## 2015-06-06 MED ORDER — SODIUM CHLORIDE 0.9% FLUSH
3.0000 mL | Freq: Two times a day (BID) | INTRAVENOUS | Status: DC
  Administered 2015-06-06 – 2015-06-07 (×3): 3 mL via INTRAVENOUS

## 2015-06-06 MED ORDER — DEXTROSE-NACL 5-0.45 % IV SOLN
INTRAVENOUS | Status: DC
  Administered 2015-06-06: 20:00:00 via INTRAVENOUS

## 2015-06-06 MED ORDER — PANTOPRAZOLE SODIUM 40 MG PO TBEC
40.0000 mg | DELAYED_RELEASE_TABLET | Freq: Two times a day (BID) | ORAL | Status: DC
Start: 2015-06-06 — End: 2015-06-08
  Administered 2015-06-06 – 2015-06-07 (×2): 40 mg via ORAL
  Filled 2015-06-06 (×2): qty 1

## 2015-06-06 MED ORDER — ENOXAPARIN SODIUM 40 MG/0.4ML ~~LOC~~ SOLN
40.0000 mg | SUBCUTANEOUS | Status: DC
  Administered 2015-06-06: 40 mg via SUBCUTANEOUS
  Filled 2015-06-06 (×2): qty 0.4

## 2015-06-06 MED ORDER — VANCOMYCIN HCL IN DEXTROSE 1-5 GM/200ML-% IV SOLN
1000.0000 mg | Freq: Once | INTRAVENOUS | Status: AC
  Administered 2015-06-06: 1000 mg via INTRAVENOUS
  Filled 2015-06-06: qty 200

## 2015-06-06 MED ORDER — TOPIRAMATE 100 MG PO TABS
100.0000 mg | ORAL_TABLET | Freq: Two times a day (BID) | ORAL | Status: DC
  Administered 2015-06-07: 100 mg via ORAL
  Filled 2015-06-06 (×5): qty 1

## 2015-06-06 MED ORDER — DEXTROSE 5 % IV SOLN: 2.0000 g | Freq: Once | INTRAVENOUS | Status: DC

## 2015-06-06 MED ORDER — ESCITALOPRAM OXALATE 20 MG PO TABS: 10.0000 mg | ORAL_TABLET | Freq: Every day | ORAL | Status: DC

## 2015-06-06 MED ORDER — GLUCERNA SHAKE PO LIQD: 237.0000 mL | Freq: Three times a day (TID) | ORAL | Status: DC

## 2015-06-06 MED ORDER — VORTIOXETINE HBR 20 MG PO TABS
20.0000 mg | ORAL_TABLET | Freq: Every day | ORAL | Status: DC
  Filled 2015-06-06 (×3): qty 20

## 2015-06-06 MED ORDER — CEFTRIAXONE SODIUM 1 G IJ SOLR: 1.0000 g | INTRAMUSCULAR | Status: DC

## 2015-06-06 MED ORDER — SERTRALINE HCL 50 MG PO TABS
25.0000 mg | ORAL_TABLET | Freq: Every day | ORAL | Status: DC
  Administered 2015-06-07 – 2015-06-08 (×2): 25 mg via ORAL
  Filled 2015-06-06 (×2): qty 1

## 2015-06-06 MED ORDER — DEXTROSE 50 % IV SOLN
INTRAVENOUS | Status: AC
  Administered 2015-06-06: 25 mL
  Filled 2015-06-06: qty 50

## 2015-06-06 MED ORDER — POTASSIUM CHLORIDE 10 MEQ/100ML IV SOLN
10.0000 meq | Freq: Once | INTRAVENOUS | Status: AC
  Administered 2015-06-06: 10 meq via INTRAVENOUS
  Filled 2015-06-06: qty 100

## 2015-06-06 MED ORDER — PIPERACILLIN-TAZOBACTAM 3.375 G IVPB
3.3750 g | Freq: Three times a day (TID) | INTRAVENOUS | Status: DC
  Administered 2015-06-07 – 2015-06-08 (×4): 3.375 g via INTRAVENOUS
  Filled 2015-06-06 (×4): qty 50

## 2015-06-06 MED ORDER — DEXTROSE 5 % IV SOLN
1.0000 g | Freq: Once | INTRAVENOUS | Status: AC
  Administered 2015-06-06: 1 g via INTRAVENOUS
  Filled 2015-06-06: qty 10

## 2015-06-06 MED ORDER — SODIUM CHLORIDE 0.9 % IV BOLUS (SEPSIS)
500.0000 mL | INTRAVENOUS | Status: AC
  Administered 2015-06-06: 500 mL via INTRAVENOUS

## 2015-06-06 MED ORDER — PIPERACILLIN-TAZOBACTAM 3.375 G IVPB 30 MIN
3.3750 g | INTRAVENOUS | Status: AC
  Administered 2015-06-06: 3.375 g via INTRAVENOUS
  Filled 2015-06-06: qty 50

## 2015-06-06 MED ORDER — LORAZEPAM 1 MG PO TABS
1.0000 mg | ORAL_TABLET | Freq: Three times a day (TID) | ORAL | Status: DC | PRN
  Administered 2015-06-06: 1 mg via ORAL
  Filled 2015-06-06: qty 1

## 2015-06-06 MED ORDER — VANCOMYCIN HCL 500 MG IV SOLR
500.0000 mg | INTRAVENOUS | Status: DC
  Administered 2015-06-07: 500 mg via INTRAVENOUS
  Filled 2015-06-06: qty 500

## 2015-06-06 MED ORDER — DEXTROSE 5 % IV SOLN: 500.0000 mg | INTRAVENOUS | Status: DC

## 2015-06-06 MED ORDER — DEXTROSE 5 % IV SOLN
500.0000 mg | Freq: Once | INTRAVENOUS | Status: AC
  Administered 2015-06-06: 500 mg via INTRAVENOUS
  Filled 2015-06-06: qty 500

## 2015-06-06 MED ORDER — SODIUM CHLORIDE 0.9 % IV SOLN
INTRAVENOUS | Status: DC
  Administered 2015-06-06: 20:00:00 via INTRAVENOUS

## 2015-06-06 MED ORDER — ACETAMINOPHEN 500 MG PO TABS: 500.0000 mg | ORAL_TABLET | Freq: Four times a day (QID) | ORAL | Status: DC | PRN

## 2015-06-06 MED ORDER — IPRATROPIUM BROMIDE 0.02 % IN SOLN: 0.5000 mg | RESPIRATORY_TRACT | Status: DC | PRN

## 2015-06-06 MED ORDER — SERTRALINE HCL 50 MG PO TABS: 25.0000 mg | ORAL_TABLET | Freq: Every day | ORAL | Status: DC

## 2015-06-06 MED ORDER — ESCITALOPRAM OXALATE 20 MG PO TABS
10.0000 mg | ORAL_TABLET | Freq: Every day | ORAL | Status: DC
  Administered 2015-06-07 – 2015-06-08 (×2): 10 mg via ORAL
  Filled 2015-06-06 (×2): qty 1

## 2015-06-06 MED ORDER — HYDROCORTISONE 10 MG PO TABS
10.0000 mg | ORAL_TABLET | Freq: Two times a day (BID) | ORAL | Status: DC
  Administered 2015-06-07 – 2015-06-08 (×2): 10 mg via ORAL
  Filled 2015-06-06 (×6): qty 1

## 2015-06-06 MED ORDER — DEXTROSE 50 % IV SOLN
INTRAVENOUS | Status: AC
  Administered 2015-06-06: 50 mL
  Filled 2015-06-06: qty 50

## 2015-06-06 MED ORDER — POTASSIUM CHLORIDE 10 MEQ/100ML IV SOLN
10.0000 meq | INTRAVENOUS | Status: AC
  Administered 2015-06-06 – 2015-06-07 (×6): 10 meq via INTRAVENOUS
  Filled 2015-06-06 (×5): qty 100

## 2015-06-06 MED ORDER — INSULIN ASPART 100 UNIT/ML ~~LOC~~ SOLN
0.0000 [IU] | SUBCUTANEOUS | Status: DC
  Administered 2015-06-07 (×3): 1 [IU] via SUBCUTANEOUS
  Administered 2015-06-07: 2 [IU] via SUBCUTANEOUS

## 2015-06-06 MED ORDER — SODIUM CHLORIDE 0.9 % IV BOLUS (SEPSIS)
2000.0000 mL | Freq: Once | INTRAVENOUS | Status: AC
Start: 2015-06-06 — End: 2015-06-06
  Administered 2015-06-06: 2000 mL via INTRAVENOUS

## 2015-06-06 MED ORDER — SODIUM CHLORIDE 0.9 % IV BOLUS (SEPSIS)
1000.0000 mL | Freq: Once | INTRAVENOUS | Status: AC
  Administered 2015-06-06: 1000 mL via INTRAVENOUS

## 2015-06-06 MED ORDER — MEMANTINE HCL ER 14 MG PO CP24
14.0000 mg | ORAL_CAPSULE | Freq: Every day | ORAL | Status: DC
  Filled 2015-06-06 (×3): qty 1

## 2015-06-06 MED ORDER — LEVALBUTEROL HCL 0.63 MG/3ML IN NEBU: 0.6300 mg | INHALATION_SOLUTION | RESPIRATORY_TRACT | Status: DC | PRN

## 2015-06-06 NOTE — H&P (Signed)
[facility] History and Physical  Lindsey Gomez [id number] DOB: [birth year] DOA: 06/06/2015  Referring physician: Dr. Zenia Resides PCP: Mathews Argyle, MD   Chief Complaint: Andre Lefort  HPI:  Ms. Herms is a 70 year old female with a past medical history significant for chronic pancreatitis/ pancreatic insufficiency, breast cancer ( s/p lumpectomy, radiation, and chemotherapy), chronic steroid use, diabetes mellitus type 2, CKD stage 3, HTN, COPD, memory impairment, chronic pain; who presents after being found unresponsive with a blood glucose of 24. History is obtained by the patient's husband as the patient has no recollection of the events in question. Husband states that for the last couple weeks that she has had difficulty in keeping foods down with intermittent nausea and vomiting. Her sugars have been very difficult to control. States that her blood glucose was over 500 last night; however this morning sugars were in the 180s for which they did not give the patient any insulin. When he rechecked her sugars this afternoon around noon blood sugars were noted to be 24, and she was unresponsive for which they called EMS. Once EMS arrived the patient was given amp of D50 with improvement of blood sugars to 200. He noted that he wanted to make the patient a full code for which the POA was noted to have agreed. It appears patient was previously placed on hospice on 3/10 for her symptoms of failure to thrive. Patient husband denies understanding of why patient was placed on hospice at this time.  Upon admission into the emergency department patient was found to have lab work showing glucose 34, potassium < 2, chloride 114, bicarbonate 17, BUN 48, creatinine 1.31, albumin 1.5, alkaline phosphatase 129, AST 73, WBC 11.5, hemoglobin 10.5, and lactic acid 0.88. Initial chest x-ray showed a new right lower lobe infiltrate. EKG showed significant prolonged QTc of 591.   Review of Systems  Unable to  perform ROS: acuity of condition  Constitutional: Positive for chills.    Past Medical History  Diagnosis Date  . Depression   . HTN (hypertension)   . Chronic pain   . Fibromyalgia   . Migraines   . History of breast cancer     Right  . Chronic pancreatitis ([place])   . Vitamin D deficiency   . GERD (gastroesophageal reflux disease)   . Finger dislocation 2009    L 3rd  . COPD (chronic obstructive pulmonary disease) ([place])   . Anemia   . Fractured sternum 11/2008  . Osteoporosis     Reclast too expensive  . Barrett's esophagus   . Chronic fatigue   . Diverticulosis   . Opioid dependence ([place])   . 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 Abnormality of gait 10/12/2013  . Anxiety   . Depression   . Type II or unspecified type diabetes mellitus without mention of complication, not stated as uncontrolled   . Pancreatitis   . Hypoglycemia   . Lung nodules   . Chronic kidney disease   . Parkinson disease ([place])   . Parkinson's disease ([place]) 06/15/2014  . Convulsions/seizures ([place]) 10/12/2013    Possible benzodiazepine withdrawal seizure, x1-no further seizure activity   . Cancer ([place])     HX OF BREAST CANCER -chemo, radiation-right   . Malnourished [facility])      Past Surgical History  Procedure Laterality Date  . Nasal sinus surgery    . Pancreatectomy      40%  . Cholecystectomy    . Appendectomy    . Abdominal hysterectomy    .  Lobectomy Left 2005    granulomatous lesion.   . Breast lumpectomy Left   . Ercp N/A 10/05/2013    Procedure: ENDOSCOPIC RETROGRADE CHOLANGIOPANCREATOGRAPHY (ERCP);  Surgeon: Ladene Artist, MD;  Location: [facility] ENDOSCOPY;  Service: Endoscopy;  Laterality: N/A;  . Cataract extraction Bilateral   . Botox injection      recent botox injection 12-23-13 injection for Migraines  . Cataract extraction, bilateral Bilateral     bilateral  . Eye surgery      1 eye "membrane surgery"  . Ercp N/A 01/07/2014    Procedure: ENDOSCOPIC RETROGRADE CHOLANGIOPANCREATOGRAPHY  (ERCP);  Surgeon: Inda Castle, MD;  Location: [facility] ENDOSCOPY;  Service: Endoscopy;  Laterality: N/A;  . Spyglass cholangioscopy N/A 01/07/2014    Procedure: [id number] CHOLANGIOSCOPY;  Surgeon: Inda Castle, MD;  Location: [facility] ENDOSCOPY;  Service: Endoscopy;  Laterality: N/A;  . Colonoscopy    . Upper gastrointestinal endoscopy        Social History:  reports that she has been smoking Cigarettes.  She has a 40 pack-year smoking history. She has never used smokeless tobacco. She reports that she does not drink alcohol or use illicit drugs. Where does patient live--home  and with whom if at home? Husband Can patient participate in ADLs? Needs assistance   Allergies  Allergen Reactions  . Milnacipran Swelling and Other (See Comments)    Headache and hand swelling   . Rizatriptan Benzoate Nausea And Vomiting  . Gabapentin Other (See Comments)    Weak muscles  . Moxifloxacin Other (See Comments)    Unknown  . Sulfonamide Derivatives Rash  . Venlafaxine Other (See Comments)    Unknown    Family History  Problem Relation Age of Onset  . COPD Father   . Heart disease Father   . CAD Father   . Cancer Father     "blood"  . Leukemia Mother   . Diabetes Maternal Uncle   . Dementia Sister   . Alzheimer's disease Sister   . Heart attack Brother   . Colon cancer Neg Hx   . Colon polyps Neg Hx   . Esophageal cancer Neg Hx         Prior to Admission medications   Medication Sig Start Date End Date Taking? Authorizing Provider  acetaminophen (TYLENOL) 500 MG tablet Take 500 mg by mouth every 6 (six) hours as needed for mild pain, moderate pain or headache.    Yes Historical Provider, MD  atenolol (TENORMIN) 50 MG tablet Take 1 tablet (50 mg total) by mouth at bedtime. 05/23/15  Yes Barton Dubois, MD  escitalopram (LEXAPRO) 20 MG tablet Take 0.5 tablets (10 mg total) by mouth daily. 05/23/15 05/22/16 Yes Barton Dubois, MD  gabapentin (NEURONTIN) 100 MG capsule TAKE 100 MG BY MOUTH  TWICE DAILY 01/17/15  Yes Historical Provider, MD  hydrocortisone (CORTEF) 10 MG tablet Take 10 mg by mouth 2 (two) times daily. 04/13/15  Yes Historical Provider, MD  hyoscyamine (LEVBID) 0.375 MG 12 hr tablet TAKE ONE TAB TWICE A DAY Patient taking differently: Take 0.375 mg by mouth twice daily 12/13/14  Yes Inda Castle, MD  insulin aspart (NOVOLOG FLEXPEN) 100 UNIT/ML FlexPen Inject 6-9 Units into the skin 3 (three) times daily with meals. 70-160: 6 UNITS 161-220: 7 UNITS  221-280: 8 UNITS 281-340: 9 UNITS   Yes Historical Provider, MD  LEVEMIR FLEXTOUCH 100 UNIT/ML Pen Inject 15 Units into the skin at bedtime. 05/25/15  Yes Historical Provider, MD  LORazepam (ATIVAN)  1 MG tablet Take 1 tablet (1 mg total) by mouth every 6 (six) hours as needed for anxiety. Patient taking differently: Take 1 mg by mouth 3 (three) times daily.  05/23/15  Yes Barton Dubois, MD  memantine (NAMENDA XR) 14 MG CP24 24 hr capsule Take 1 capsule (14 mg total) by mouth at bedtime. 05/23/15  Yes Barton Dubois, MD  mometasone (NASONEX) 50 MCG/ACT nasal spray Place 2 sprays into both nostrils daily as needed (for congestion).    Yes Historical Provider, MD  omeprazole (PRILOSEC) 40 MG capsule Take 40 mg by mouth daily.   Yes Historical Provider, MD  ondansetron (ZOFRAN) 8 MG tablet TAKE 1 TABLET (8 MG TOTAL) BY MOUTH 2 (TWO) TIMES DAILY AS NEEDED FOR NAUSEA 05/04/15  Yes Historical Provider, MD  sertraline (ZOLOFT) 25 MG tablet Take 25 mg by mouth daily.   Yes Historical Provider, MD  topiramate (TOPAMAX) 100 MG tablet Take 100 mg by mouth 2 (two) times daily. 05/15/15  Yes Historical Provider, MD  triamcinolone (KENALOG) 0.1 % ointment Apply 1 application topically 2 (two) times daily as needed (itching).    Yes Historical Provider, MD  Vortioxetine HBr (TRINTELLIX) 20 MG TABS Take 20 mg by mouth at bedtime.   Yes Historical Provider, MD  HYDROcodone-homatropine (HYCODAN) 5-1.5 MG/5ML syrup Take 5 mLs by mouth every 6 (six)  hours as needed for cough. Patient not taking: Reported on 06/06/2015 05/23/15   Barton Dubois, MD  loperamide (IMODIUM) 2 MG capsule Take 1 capsule (2 mg total) by mouth as needed for diarrhea or loose stools. Patient not taking: Reported on 06/06/2015 05/23/15   Barton Dubois, MD     Physical Exam: Filed Vitals:   06/06/15 1815 06/06/15 1900 06/06/15 1915 06/06/15 1935  BP: 77/52 98/67 100/80 94/72  Pulse:  57 56 57  Temp:      TempSrc:      Resp: 15     SpO2:  100% 99% 91%     Constitutional: Vital signs reviewed. Patient is cachectic, lethargic, and chronically ill-appearing Head: Normocephalic and atraumatic  Ear: TM normal bilaterally  Mouth: no erythema or exudates, MMM  Eyes: PERRL, EOMI, conjunctivae normal, No scleral icterus.  Neck: Supple, Trachea midline normal ROM, No JVD, mass, thyromegaly, or carotid bruit present.  Cardiovascular: Bradycardic Pulmonary/Chest: CTAB, no wheezes, rales, or rhonchi  Abdominal: Soft. Non-tender, non-distended, bowel sounds are normal, no masses, organomegaly, or guarding present.  GU: no CVA tenderness Musculoskeletal: No joint deformities, erythema, or stiffness, ROM full and no nontender Ext: no edema and no cyanosis, pulses palpable bilaterally (DP and PT)  Hematology: no cervical, inginal, or axillary adenopathy.  Neurological: Decreased 3-4 out of 5 strenght and symmetric bilaterally, cranial nerve II-XII are grossly intact, no focal motor deficit, sensory intact to light touch bilaterally.  Skin: Warm, dry, patient has lower extremity circular wounds. No rash, cyanosis, or clubbing.  Psychiatric: lethargic, speech slowed.     Data Review   Micro Results No results found for this or any previous visit (from the past 240 hour(s)).  Radiology Reports Dg Chest 2 View  05/17/2015  CLINICAL DATA:  Nausea, diarrhea, and decreased urine output for 1 week. Breast carcinoma. EXAM: CHEST  2 VIEW COMPARISON:  03/31/2015 FINDINGS: Severe  emphysema is again demonstrated. New left lower lobe airspace disease is seen, suspicious for pneumonia. There is a persistent 3.5 cm nodular opacity in the right perihilar region, suspicious for neoplasm. Pleural-parenchymal scarring also seen in this region. Heart size  is within normal limits. Tiny left pleural effusion versus pleural thickening again noted. IMPRESSION: New left lower lobe airspace disease, suspicious for pneumonia. Severe emphysema. Persistent 3.5 cm nodular opacity in right perihilar region, suspicious for neoplasm. Chest CT with contrast is suggested for further evaluation. Electronically Signed   By: Earle Gell M.D.   On: 05/17/2015 17:58   Dg Chest Port 1 View  06/06/2015  CLINICAL DATA:  According to notes pt was found to be unresponsive. Hx of diabetes, parkinsons, and breast ca. EXAM: PORTABLE CHEST - 1 VIEW COMPARISON:  05/17/2015 FINDINGS: New interstitial and airspace opacities in the right lower lobe, obscuring some of the nodules seen previously. Coarse opacities at the left lung base persist. Heart size remains normal. Underlying pulmonary emphysema with attenuated bronchovascular markings. Staple line in the left mid lung. No pneumothorax. No effusion. Old right rib fractures. No effusion. Surgical clips in the upper abdomen. IMPRESSION: 1. New right lower lung interstitial and airspace disease suggesting pneumonia versus asymmetric edema. Electronically Signed   By: Lucrezia Europe M.D.   On: 06/06/2015 16:21   Dg Swallowing Func-speech Pathology  05/20/2015  Objective Swallowing Evaluation: Type of Study: MBS-Modified Barium Swallow Study Patient Details Name: GARA WAHLS MRN: [id number] Date of Birth: October 24, 1945 Today's Date: 05/20/2015 Time: SLP Start Time (ACUTE ONLY): 1315-SLP Stop Time (ACUTE ONLY): 1330 SLP Time Calculation (min) (ACUTE ONLY): 15 min Past Medical History: Past Medical History Diagnosis Date . Depression  . HTN (hypertension)  . Chronic pain  . Fibromyalgia   . Migraines  . History of breast cancer    Right . Chronic pancreatitis ([place])  . Vitamin D deficiency  . GERD (gastroesophageal reflux disease)  . Finger dislocation 2009   L 3rd . COPD (chronic obstructive pulmonary disease) ([place])  . Anemia  . Fractured sternum 11/2008 . Osteoporosis    Reclast too expensive . Barrett's esophagus  . Chronic fatigue  . Diverticulosis  . Opioid dependence ([place])  . Memory disorder 10/12/2013 . Abnormality of gait 10/12/2013 . Anxiety  . Depression  . Type II or unspecified type diabetes mellitus without mention of complication, not stated as uncontrolled  . Pancreatitis  . Hypoglycemia  . Lung nodules  . Chronic kidney disease  . Parkinson disease ([place])  . Parkinson's disease ([place]) 06/15/2014 . Convulsions/seizures ([place]) 10/12/2013   Possible benzodiazepine withdrawal seizure, x1-no further seizure activity  . Cancer ([place])    HX OF BREAST CANCER -chemo, radiation-right  . Malnourished [facility])  Past Surgical History: Past Surgical History Procedure Laterality Date . Nasal sinus surgery   . Pancreatectomy     40% . Cholecystectomy   . Appendectomy   . Abdominal hysterectomy   . Lobectomy Left 2005   granulomatous lesion.  . Breast lumpectomy Left  . Ercp N/A 10/05/2013   Procedure: ENDOSCOPIC RETROGRADE CHOLANGIOPANCREATOGRAPHY (ERCP);  Surgeon: Ladene Artist, MD;  Location: [facility] ENDOSCOPY;  Service: Endoscopy;  Laterality: N/A; . Cataract extraction Bilateral  . Botox injection     recent botox injection 12-23-13 injection for Migraines . Cataract extraction, bilateral Bilateral    bilateral . Eye surgery     1 eye "membrane surgery" . Ercp N/A 01/07/2014   Procedure: ENDOSCOPIC RETROGRADE CHOLANGIOPANCREATOGRAPHY (ERCP);  Surgeon: Inda Castle, MD;  Location: [facility] ENDOSCOPY;  Service: Endoscopy;  Laterality: N/A; . Spyglass cholangioscopy N/A 01/07/2014   Procedure: [id number] CHOLANGIOSCOPY;  Surgeon: Inda Castle, MD;  Location: [facility] ENDOSCOPY;  Service: Endoscopy;  Laterality: N/A; .  Colonoscopy   .  Upper gastrointestinal endoscopy   HPI: Patient is a 70 y.o. female with PMH: chronic pancreatitis, pancreatic insufficiency, chronic pain, non-compliance with meds, who presented to ED with c/o generalized weakness, nausea, vomiting, diarrhea, and poor PO intake. Patient with recent admission for PNA in January of this year. Subjective: pleasant and cooperative Assessment / Plan / Recommendation CHL IP CLINICAL IMPRESSIONS 05/20/2015 Therapy Diagnosis Mild oral phase dysphagia;Mild pharyngeal phase dysphagia Clinical Impression Patient presents with a mild oral and mild pharyngeal dysphagia. Oral dysphagia is characterized by decreased lingual ROM and ability to transit pill anteriorly to posterior in oral cavity as well as decreased mastication and oral manipulation of hard solid. Patient's pharyngeal dysphagia was characterized by swallow initiation delays to vallecular sinus, one instance of flash penetration of thin liquids via straw sip, and mild amount of residuals in vallecular sinus with regular, puree and nectar thick liquids, and trace-mild vallecular residuals with thin liquids. Patient exhibited full clearance of pharyngeal residuals with sip of thin liquids.  Impact on safety and function Mild aspiration risk   CHL IP TREATMENT RECOMMENDATION 05/20/2015 Treatment Recommendations Therapy as outlined in treatment plan below   Prognosis 05/20/2015 Prognosis for Safe Diet Advancement Good Barriers to Reach Goals -- Barriers/Prognosis Comment -- CHL IP DIET RECOMMENDATION 05/20/2015 SLP Diet Recommendations Regular solids;Thin liquid Liquid Administration via Cup;Straw Medication Administration Crushed with puree Compensations Minimize environmental distractions;Slow rate;Small sips/bites Postural Changes Remain semi-upright after after feeds/meals (Comment);Seated upright at 90 degrees   CHL IP OTHER RECOMMENDATIONS 05/20/2015 Recommended Consults -- Oral Care Recommendations Oral care BID Other  Recommendations --   CHL IP FOLLOW UP RECOMMENDATIONS 05/20/2015 Follow up Recommendations None   CHL IP FREQUENCY AND DURATION 05/20/2015 Speech Therapy Frequency (ACUTE ONLY) min 1 x/week Treatment Duration 1 week      CHL IP ORAL PHASE 05/20/2015 Oral Phase Impaired Oral - Pudding Teaspoon -- Oral - Pudding Cup -- Oral - Honey Teaspoon -- Oral - Honey Cup -- Oral - Nectar Teaspoon -- Oral - Nectar Cup -- Oral - Nectar Straw -- Oral - Thin Teaspoon -- Oral - Thin Cup -- Oral - Thin Straw -- Oral - Puree -- Oral - Mech Soft -- Oral - Regular Impaired mastication;Delayed oral transit Oral - Multi-Consistency -- Oral - Pill Weak lingual manipulation;Other (Comment) Oral Phase - Comment --  CHL IP PHARYNGEAL PHASE 05/20/2015 Pharyngeal Phase Impaired Pharyngeal- Pudding Teaspoon -- Pharyngeal -- Pharyngeal- Pudding Cup -- Pharyngeal -- Pharyngeal- Honey Teaspoon -- Pharyngeal -- Pharyngeal- Honey Cup -- Pharyngeal -- Pharyngeal- Nectar Teaspoon -- Pharyngeal -- Pharyngeal- Nectar Cup Delayed swallow initiation-vallecula;Pharyngeal residue - valleculae Pharyngeal -- Pharyngeal- Nectar Straw -- Pharyngeal -- Pharyngeal- Thin Teaspoon -- Pharyngeal -- Pharyngeal- Thin Cup Delayed swallow initiation-vallecula Pharyngeal -- Pharyngeal- Thin Straw Delayed swallow initiation-vallecula;Penetration/Aspiration during swallow Pharyngeal Material enters airway, remains ABOVE vocal cords then ejected out Pharyngeal- Puree Delayed swallow initiation-vallecula;Pharyngeal residue - valleculae;Compensatory strategies attempted (with notebox) Pharyngeal -- Pharyngeal- Mechanical Soft -- Pharyngeal -- Pharyngeal- Regular Delayed swallow initiation-vallecula;Pharyngeal residue - valleculae;Compensatory strategies attempted (with notebox) Pharyngeal -- Pharyngeal- Multi-consistency -- Pharyngeal -- Pharyngeal- Pill Other (Comment) Pharyngeal -- Pharyngeal Comment --  CHL IP CERVICAL ESOPHAGEAL PHASE 05/20/2015 Cervical Esophageal Phase WFL  Pudding Teaspoon -- Pudding Cup -- Honey Teaspoon -- Honey Cup -- Nectar Teaspoon -- Nectar Cup -- Nectar Straw -- Thin Teaspoon -- Thin Cup -- Thin Straw -- Puree -- Mechanical Soft -- Regular -- Multi-consistency -- Pill -- Cervical Esophageal Comment -- No flowsheet data found. Dannial Monarch  05/20/2015, 4:03 PM    [name], MA, CCC-SLP 05/20/2015 4:04 PM             CBC  Recent Labs Lab 06/06/15 1759  WBC 11.5*  HGB 10.5*  HCT 29.5*  PLT 277  MCV 90.5  MCH 32.2  MCHC 35.6  RDW 14.7  LYMPHSABS 3.0  MONOABS 0.6  EOSABS 0.0  BASOSABS 0.0    Chemistries   Recent Labs Lab 06/06/15 1759  NA 140  K <2.0*  CL 114*  CO2 17*  GLUCOSE 34*  BUN 48*  CREATININE 1.31*  CALCIUM 6.8*  AST 73*  ALT 34  ALKPHOS 129*  BILITOT 0.4   ------------------------------------------------------------------------------------------------------------------ CrCl cannot be calculated (Unknown ideal weight.). ------------------------------------------------------------------------------------------------------------------ No results for input(s): HGBA1C in the last 72 hours. ------------------------------------------------------------------------------------------------------------------ No results for input(s): CHOL, HDL, LDLCALC, TRIG, CHOLHDL, LDLDIRECT in the last 72 hours. ------------------------------------------------------------------------------------------------------------------ No results for input(s): TSH, T4TOTAL, T3FREE, THYROIDAB in the last 72 hours.  Invalid input(s): FREET3 ------------------------------------------------------------------------------------------------------------------ No results for input(s): VITAMINB12, FOLATE, FERRITIN, TIBC, IRON, RETICCTPCT in the last 72 hours.  Coagulation profile No results for input(s): INR, PROTIME in the last 168 hours.  No results for input(s): DDIMER in the last 72 hours.  Cardiac Enzymes No results for  input(s): CKMB, TROPONINI, MYOGLOBIN in the last 168 hours.  Invalid input(s): CK ------------------------------------------------------------------------------------------------------------------ Invalid input(s): POCBNP   CBG:  Recent Labs Lab 06/06/15 1807  GLUCAP 41*       EKG: Independently reviewed. Sinus rhythm with nonspecific ST changes QTc 591   Assessment/Plan Hypoglycemia with  history of diabetes mellitus type1: Acute. Patient noted to not being able to eat and drinking well at home reported to be having recurrent nausea and vomiting with erratic blood sugars. Patient initial blood sugars 24 with recurrent episodes of hypoglycemia resulting in patient being placed on D5 1/2 NS drip. - Admit to stepdown - Hypoglycemic protocols with every hour CBGs - Continued D5 1/2 NS IV fluids until blood glucoses stable greater than 150 - Hold home Levemir and NovoLog FlexPen for now will need to readjust home regimen in a.m.   Acute encephalopathy: Most likely secondary to above - Continue to monitor  Severe Hypokalemia: Acute. On admission patient's potassium was less than 2. Given 10 mEq by the ED physician. - Potassium chloride IV 60 mEq over 6 hours - recheck potassium level to make sure value recently obtained is accurate - Will give additional 40-60 meq IV/PO depending if patient alert  Suspected HCAP: Acute. Patient with signs of a new right lower lobe infiltrate on chest x-ray. Patient initially started on ceftriaxone and azithromycin. Patient's previous chest x-ray done from 05/17/2015 showed a left lower lobe infiltrate. Patient with multiple admissions for last 3 months. On differential also including possibility of aspiration given patient found unresponsive.   - Check sputum cultures - Empiric antibiotics of vancomycin and Zosyn given the possibility of aspiration and need of anaerobic coverage - continuous pulse oximetry with nasal cannula oxygen to keep O2 sats  greater than 92% - Xopenex and ipratropium prn every 4 hours as needed for shortness of breath and/or wheezing  Transient hypotension with history of essential hypertension: Resolved. Initial blood pressure 74/53with IV fluids 3.5 L of normal saline IV fluids while in the emergency department. - holding atenolol for now until bp improves - restart atenolol unable   Chronic steroid use secondary to a history of adrenal insufficiency - Continue hydrocortisone - If hypotension persists will give stress  dose of hydrocortisone  Nausea and vomiting: Acute on chronic. Patient has had no nausea and vomiting since being in the urgency department. - NPO at this time until able to be evaluated by speech - Continue to monitor  Elevated alkaline phosphatase and [id number].Mild elevations of alkaline phosphatase to 129 and AST 229. - checking lipase - Continue to monitor and follow up CMP in a.m.  Memory impairment - Continue Namenda  Chronic kidney disease stage III: stable. - continue to monitor  History of dysplasia: Patient previously evaluated during earlier admission. Recommendations as seen below: Diet  Regular solids;Thin liquid  Liquid Administration via Cup;Straw  Medication Administration Crushed with puree  Compensations Minimize environmental distractions;Slow rate;Small sips/bites  Postural Changes Remain semi-upright after after feeds/meals (Comment);Seated upright at 90 degrees      - carb modified dysphagia diet per previous recommendations -  Speech therapy to eval and treat  Prolonged QT: Acute. Patient's QTc was elevated at 591 on admission. Replacing electrolytes now of potassium. No overt signs of ischemia on EKG and initial troponins negative. - Check magnesium  - repeat EKG  Anxiety /Depression - Continue Zoloft, vortioxetine, and Ativan prn  Chronic severe protein malnutrition: Acutely worsen as patient's albumin on admission 1.5,  previously 2.5. -  Glucerna with meals as tolerated - prealbumin in am  GERD - Pharmacy substitution of Protonix for omeprazole  Hospice care: Patient was previously reported be on hospice care since her last admission earlier this month. - Will re-need to discuss goals of care and DNR status, in which I tried to explain that that does not mean do not treat  Code Status:   DO NOT RESUSCITATE, and patient is now a full code  Family Communication: bedside Disposition Plan: admit   Total time spent 55 minutes.Greater than 50% of this time was spent in counseling, explanation of diagnosis, planning of further management, and coordination of care  [facility] Pager [phone]  If 7PM-7AM, please contact night-coverage [email or web address] Password [facility] 06/06/2015, 7:40 PM

## 2015-06-06 NOTE — ED Notes (Signed)
Pt. Is unable to urinate at this time.  

## 2015-06-06 NOTE — Progress Notes (Signed)
PHARMACY NOTE -  ANTIBIOTIC RENAL DOSE ADJUSTMENT   Request received for Pharmacy to assist with antibiotic adjustment of ceftriaxone and azithromycin for CAP.   Plan:  Ceftriaxone 1g IV q24h x 7 days  Azithromycin 500mg  IV x 7 days  Need for further dosage adjustment appears unlikely at present. Will sign off at this time.    Please reconsult if a change in clinical status warrants re-evaluation of dosage.  Peggyann Juba, PharmD, BCPS Pager: [phone] 06/06/2015 4:42 PM

## 2015-06-06 NOTE — Progress Notes (Signed)
Pharmacy Antibiotic Note  Lindsey Gomez is a 70 y.o. female admitted on 06/06/2015 with hypoglycemia. CXR shows new RLL infiltrate with concern for aspiration as patient was found unresponsive. Patient recently admitted 3/7-3/13 and treated with 6 days of abx for MSSA pneumonia. Pharmacy is now consulted to dose vancomycin and zosyn for sepsis and HCAP.  Plan: Vancomycin 1g in ED, then 500mg  IV every 24 hours.  Goal trough 15-20 mcg/mL. Zosyn 3.375g IV q8h (4 hour infusion).  Follow up renal function & cultures De-escalate as appropriate    Temp (24hrs), Avg:98.2 F (36.8 C), Min:98.2 F (36.8 C), Max:98.2 F (36.8 C)   Recent Labs Lab 06/06/15 1759 06/06/15 1804  WBC 11.5*  --   CREATININE 1.31*  --   LATICACIDVEN  --  0.66    CrCl cannot be calculated (Unknown ideal weight.).   CrCl 46 ml/min/1.98m2 (normalized)  Allergies  Allergen Reactions  . Milnacipran Swelling and Other (See Comments)    Headache and hand swelling   . Rizatriptan Benzoate Nausea And Vomiting  . Gabapentin Other (See Comments)    Weak muscles  . Moxifloxacin Other (See Comments)    Unknown  . Sulfonamide Derivatives Rash  . Venlafaxine Other (See Comments)    Unknown    Antimicrobials this admission: Rocephin/Zithro x 1 Vancomycin 3/27 >> Zosyn 3/27 >>  Dose adjustments this admission: ---  Microbiology results: 3/8 Sputum: abundant MSSA 3/27 BCx: sent 3/27 UCx:   Thank you for allowing pharmacy to be a part of this patient's care.  Peggyann Juba, PharmD, BCPS Pager: [phone] 06/06/2015 8:57 PM

## 2015-06-06 NOTE — ED Provider Notes (Signed)
CSN: [id number]     Arrival date & time 06/06/15  1528 History   First MD Initiated Contact with Patient 06/06/15 1545     Chief Complaint  Patient presents with  . unresponsive     DNR     (Consider location/radiation/quality/duration/timing/severity/associated sxs/prior Treatment) HPI Comments: Patient here from home unresponsive. Family called and stated that she was more lethargic today. Patient is on home hospice as well as has a DO NOT RESUSCITATE form. EMS was called and place his blood sugar was in the 20s and she was given D50 and now is in the 200s. Patient was hypotensive as well and was given IV fluids which she has responded to. No further history obtainable due to her current state  The history is provided by the EMS personnel. The history is limited by the condition of the patient.    Past Medical History  Diagnosis Date  . Depression   . HTN (hypertension)   . Chronic pain   . Fibromyalgia   . Migraines   . History of breast cancer     Right  . Chronic pancreatitis ([place])   . Vitamin D deficiency   . GERD (gastroesophageal reflux disease)   . Finger dislocation 2009    L 3rd  . COPD (chronic obstructive pulmonary disease) ([place])   . Anemia   . Fractured sternum 11/2008  . Osteoporosis     Reclast too expensive  . Barrett's esophagus   . Chronic fatigue   . Diverticulosis   . Opioid dependence ([place])   . 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 Abnormality of gait 10/12/2013  . Anxiety   . Depression   . Type II or unspecified type diabetes mellitus without mention of complication, not stated as uncontrolled   . Pancreatitis   . Hypoglycemia   . Lung nodules   . Chronic kidney disease   . Parkinson disease ([place])   . Parkinson's disease ([place]) 06/15/2014  . Convulsions/seizures ([place]) 10/12/2013    Possible benzodiazepine withdrawal seizure, x1-no further seizure activity   . Cancer ([place])     HX OF BREAST CANCER -chemo, radiation-right   . Malnourished [facility])    Past  Surgical History  Procedure Laterality Date  . Nasal sinus surgery    . Pancreatectomy      40%  . Cholecystectomy    . Appendectomy    . Abdominal hysterectomy    . Lobectomy Left 2005    granulomatous lesion.   . Breast lumpectomy Left   . Ercp N/A 10/05/2013    Procedure: ENDOSCOPIC RETROGRADE CHOLANGIOPANCREATOGRAPHY (ERCP);  Surgeon: Ladene Artist, MD;  Location[facility] ENDOSCOPY;  Service: Endoscopy;  Laterality: N/A;  . Cataract extraction Bilateral   . Botox injection      recent botox injection 12-23-13 injection for Migraines  . Cataract extraction, bilateral Bilateral     bilateral  . Eye surgery      1 eye "membrane surgery"  . Ercp N/A 01/07/2014    Procedure: ENDOSCOPIC RETROGRADE CHOLANGIOPANCREATOGRAPHY (ERCP);  Surgeon: Inda Castle, MD;  Location: [facility] ENDOSCOPY;  Service: Endoscopy;  Laterality: N/A;  . Spyglass cholangioscopy N/A 01/07/2014    Procedure: [id number] CHOLANGIOSCOPY;  Surgeon: Inda Castle, MD;  Location: [facility] ENDOSCOPY;  Service: Endoscopy;  Laterality: N/A;  . Colonoscopy    . Upper gastrointestinal endoscopy     Family History  Problem Relation Age of Onset  . COPD Father   . Heart disease Father   . CAD Father   .  Cancer Father     "blood"  . Leukemia Mother   . Diabetes Maternal Uncle   . Dementia Sister   . Alzheimer's disease Sister   . Heart attack Brother   . Colon cancer Neg Hx   . Colon polyps Neg Hx   . Esophageal cancer Neg Hx    Social History  Substance Use Topics  . Smoking status: Current Every Day Smoker -- 1.00 packs/day for 40 years    Types: Cigarettes    Last Attempt to Quit: 01/25/2014  . Smokeless tobacco: Never Used  . Alcohol Use: No   OB History    No data available     Review of Systems  Unable to perform ROS: Acuity of condition      Allergies  Milnacipran; Rizatriptan benzoate; Gabapentin; Moxifloxacin; Sulfonamide derivatives; and Venlafaxine  Home Medications   Prior to Admission  medications   Medication Sig Start Date End Date Taking? Authorizing Provider  acetaminophen (TYLENOL) 500 MG tablet Take 500 mg by mouth every 6 (six) hours as needed.    Historical Provider, MD  atenolol (TENORMIN) 50 MG tablet Take 1 tablet (50 mg total) by mouth at bedtime. 05/23/15   Barton Dubois, MD  escitalopram (LEXAPRO) 20 MG tablet Take 0.5 tablets (10 mg total) by mouth daily. 05/23/15 05/22/16  Barton Dubois, MD  gabapentin (NEURONTIN) 100 MG capsule TAKE ONE CAPSULE BY MOUTH TWICE DAILY FOR 90 DAYS 01/17/15   Historical Provider, MD  HYDROcodone-homatropine (HYCODAN) 5-1.5 MG/5ML syrup Take 5 mLs by mouth every 6 (six) hours as needed for cough. 05/23/15   Barton Dubois, MD  hyoscyamine (LEVBID) 0.375 MG 12 hr tablet TAKE ONE TAB TWICE A DAY 12/13/14   Inda Castle, MD  loperamide (IMODIUM) 2 MG capsule Take 1 capsule (2 mg total) by mouth as needed for diarrhea or loose stools. 05/23/15   Barton Dubois, MD  LORazepam (ATIVAN) 1 MG tablet Take 1 tablet (1 mg total) by mouth every 6 (six) hours as needed for anxiety. 05/23/15   Barton Dubois, MD  memantine (NAMENDA XR) 14 MG CP24 24 hr capsule Take 1 capsule (14 mg total) by mouth at bedtime. 05/23/15   Barton Dubois, MD  mometasone (NASONEX) 50 MCG/ACT nasal spray Place 2 sprays into both nostrils daily as needed (for congestion).     Historical Provider, MD  omeprazole (PRILOSEC) 40 MG capsule Take 40 mg by mouth daily.    Historical Provider, MD  ondansetron (ZOFRAN) 8 MG tablet TAKE 1 TABLET (8 MG TOTAL) BY MOUTH 2 (TWO) TIMES DAILY. 05/04/15   Historical Provider, MD  sertraline (ZOLOFT) 25 MG tablet Take 25 mg by mouth daily.    Historical Provider, MD  triamcinolone (KENALOG) 0.1 % ointment Apply 1 application topically 2 (two) times daily as needed (itching).     Historical Provider, MD  Vortioxetine HBr (TRINTELLIX) 20 MG TABS Take 20 mg by mouth at bedtime.    Historical Provider, MD   Temp(Src) 98.2 F (36.8 C) (Rectal) Physical  Exam  Constitutional: She appears lethargic. She appears cachectic. She has a sickly appearance. She appears ill.  HENT:  Head: Normocephalic and atraumatic.  Eyes: Conjunctivae, EOM and lids are normal. Pupils are equal, round, and reactive to light.  Neck: Normal range of motion. Neck supple. No tracheal deviation present. No thyroid mass present.  Cardiovascular: Normal rate, regular rhythm and normal heart sounds.  Exam reveals no gallop.   No murmur heard. Pulmonary/Chest: Effort normal and breath sounds  normal. No stridor. No respiratory distress. She has no decreased breath sounds. She has no wheezes. She has no rhonchi. She has no rales.  Abdominal: Soft. Normal appearance and bowel sounds are normal. She exhibits no distension. There is no tenderness. There is no rebound and no CVA tenderness.  Musculoskeletal: Normal range of motion. She exhibits no edema or tenderness.       Back:  Neurological: She appears lethargic. She displays atrophy. No cranial nerve deficit. GCS eye subscore is 3. GCS verbal subscore is 4. GCS motor subscore is 5.  Skin: Skin is warm and dry. No abrasion and no rash noted.  Psychiatric: She has a normal mood and affect. Her speech is normal and behavior is normal.  Nursing note and vitals reviewed.   ED Course  Procedures (including critical care time) Labs Review Labs Reviewed  CULTURE, BLOOD (ROUTINE X 2)  CULTURE, BLOOD (ROUTINE X 2)  URINE CULTURE  CBC WITH DIFFERENTIAL/PLATELET  COMPREHENSIVE METABOLIC PANEL  URINALYSIS, ROUTINE W REFLEX MICROSCOPIC (NOT AT [facility])  I-STAT CG4 LACTIC ACID, ED    Imaging Review No results found. I have personally reviewed and evaluated these images and lab results as part of my medical decision-making.   EKG Interpretation   Date/Time:  Monday June 06 2015 16:13:37 EDT Ventricular Rate:  67 PR Interval:  138 QRS Duration: 85 QT Interval:  560 QTC Calculation: 591 R Axis:   68 Text Interpretation:   Sinus rhythm Nonspecific repol abnormality, diffuse  leads Prolonged QT interval No significant change since last tracing  Confirmed by Anatole Apollo  MD, Eliazar Olivar ([id number]) on 06/06/2015 7:28:22 PM      MDM   Final diagnoses:  None     Patient was hypotensive initially and was given IV fluids here. Was found to have recurrent hypoglycemia that was treated with IV D50 as well as a dextrose drip. Also found to be hypokalemic and potassium IV was ordered. Has evidence of pneumonia on chest x-ray and treated with antibiotics for community acquired pneumonia. Patient's nurse spoke with the family and they're requesting full support at this time. She became much more responsive after she received the second dose of IV dextrose. Will be admitted to the hospitalist service   CRITICAL CARE Performed by: Leota Jacobsen Total critical care time: 60 minutes Critical care time was exclusive of separately billable procedures and treating other patients. Critical care was necessary to treat or prevent imminent or life-threatening deterioration. Critical care was time spent personally by me on the following activities: development of treatment plan with patient and/or surrogate as well as nursing, discussions with consultants, evaluation of patient's response to treatment, examination of patient, obtaining history from patient or surrogate, ordering and performing treatments and interventions, ordering and review of laboratory studies, ordering and review of radiographic studies, pulse oximetry and re-evaluation of patient's condition.   Lacretia Leigh, MD 06/06/15 1929

## 2015-06-06 NOTE — ED Notes (Signed)
Two attempt. Unsuccessful with labs. Nurse have been info

## 2015-06-06 NOTE — ED Notes (Addendum)
Per EMS- patient has been unresponsive. Patient has a yellow form-DNR. Patient's family thought the patient was sleeping all dayuntil they could not arouse her. Patient was given Dextrose 25 gms for a CBG of 24. CBG increased to 288 prior to arrival to the ED. EMS also gave 1500 ml NS prior to the arrival tot he ED. For a BP of 64/38. BP- 95/62 prior to arrival to the ED.

## 2015-06-06 NOTE — ED Notes (Signed)
Writer spoke with patient's daughter/[name]/POA. Patient's daughter wants the patient to be a full code/everything done possible because her dad is not willing to make her a DNR. EDP/Allen made aware.

## 2015-06-06 NOTE — ED Notes (Signed)
Unable to obtain blood. ED Phlebotomist notified and is to attempt.

## 2015-06-06 NOTE — ED Notes (Signed)
Bed: WA08 Expected date:  Expected time:  Means of arrival:  Comments: Unresponsive, hypotensive, Hospice DNR

## 2015-06-06 NOTE — ED Notes (Signed)
Charge ICU RN notified of patient coming up in 20 minutes.

## 2015-06-06 NOTE — ED Notes (Signed)
Marvene Staff POA/daughter [phone]

## 2015-06-07 DIAGNOSIS — R627 Adult failure to thrive: Secondary | ICD-10-CM

## 2015-06-07 DIAGNOSIS — Z515 Encounter for palliative care: Secondary | ICD-10-CM

## 2015-06-07 LAB — URINALYSIS, ROUTINE W REFLEX MICROSCOPIC
Bilirubin Urine: NEGATIVE
GLUCOSE, UA: NEGATIVE mg/dL
Ketones, ur: NEGATIVE mg/dL
Nitrite: NEGATIVE
PH: 6 (ref 5.0–8.0)
Protein, ur: NEGATIVE mg/dL
SPECIFIC GRAVITY, URINE: 1.012 (ref 1.005–1.030)

## 2015-06-07 LAB — CBC
HCT: 38.7 % (ref 36.0–46.0)
HEMOGLOBIN: 13.7 g/dL (ref 12.0–15.0)
MCH: 31.8 pg (ref 26.0–34.0)
MCHC: 35.4 g/dL (ref 30.0–36.0)
MCV: 89.8 fL (ref 78.0–100.0)
Platelets: 295 10*3/uL (ref 150–400)
RBC: 4.31 MIL/uL (ref 3.87–5.11)
RDW: 14.5 % (ref 11.5–15.5)
WBC: 11.5 10*3/uL — AB (ref 4.0–10.5)

## 2015-06-07 LAB — URINE MICROSCOPIC-ADD ON: Squamous Epithelial / LPF: NONE SEEN

## 2015-06-07 LAB — GLUCOSE, CAPILLARY
GLUCOSE-CAPILLARY: 131 mg/dL — AB (ref 65–99)
GLUCOSE-CAPILLARY: 134 mg/dL — AB (ref 65–99)
GLUCOSE-CAPILLARY: 179 mg/dL — AB (ref 65–99)
GLUCOSE-CAPILLARY: 187 mg/dL — AB (ref 65–99)
GLUCOSE-CAPILLARY: 69 mg/dL (ref 65–99)
Glucose-Capillary: 65 mg/dL (ref 65–99)
Glucose-Capillary: 105 mg/dL — ABNORMAL HIGH (ref 65–99)
Glucose-Capillary: 183 mg/dL — ABNORMAL HIGH (ref 65–99)
Glucose-Capillary: 129 mg/dL — ABNORMAL HIGH (ref 65–99)
Glucose-Capillary: 123 mg/dL — ABNORMAL HIGH (ref 65–99)

## 2015-06-07 LAB — COMPREHENSIVE METABOLIC PANEL
ALK PHOS: 187 U/L — AB (ref 38–126)
ALT: 78 U/L — ABNORMAL HIGH (ref 14–54)
ANION GAP: 14 (ref 5–15)
AST: 156 U/L — ABNORMAL HIGH (ref 15–41)
Albumin: 1.8 g/dL — ABNORMAL LOW (ref 3.5–5.0)
BILIRUBIN TOTAL: 0.6 mg/dL (ref 0.3–1.2)
BUN: 43 mg/dL — ABNORMAL HIGH (ref 6–20)
CALCIUM: 7.4 mg/dL — AB (ref 8.9–10.3)
CO2: 15 mmol/L — ABNORMAL LOW (ref 22–32)
Chloride: 110 mmol/L (ref 101–111)
Creatinine, Ser: 1.4 mg/dL — ABNORMAL HIGH (ref 0.44–1.00)
GFR, EST AFRICAN AMERICAN: 44 mL/min — AB (ref >60)
GFR, EST NON AFRICAN AMERICAN: 38 mL/min — AB (ref >60)
Glucose, Bld: 153 mg/dL — ABNORMAL HIGH (ref 65–99)
Potassium: 2.7 mmol/L — CL (ref 3.5–5.1)
Sodium: 139 mmol/L (ref 135–145)
TOTAL PROTEIN: 5.5 g/dL — AB (ref 6.5–8.1)

## 2015-06-07 LAB — MRSA PCR SCREENING: MRSA BY PCR: NEGATIVE

## 2015-06-07 MED ORDER — KCL IN DEXTROSE-NACL 40-5-0.45 MEQ/L-%-% IV SOLN
INTRAVENOUS | Status: DC
  Administered 2015-06-07 (×2): via INTRAVENOUS
  Filled 2015-06-07 (×2): qty 1000

## 2015-06-07 MED ORDER — POTASSIUM CHLORIDE 2 MEQ/ML IV SOLN: INTRAVENOUS | Status: DC

## 2015-06-07 MED ORDER — ENSURE ENLIVE PO LIQD: 237.0000 mL | Freq: Two times a day (BID) | ORAL | Status: DC

## 2015-06-07 MED ORDER — ENOXAPARIN SODIUM 30 MG/0.3ML ~~LOC~~ SOLN
30.0000 mg | Freq: Every day | SUBCUTANEOUS | Status: DC
  Administered 2015-06-07: 30 mg via SUBCUTANEOUS
  Filled 2015-06-07: qty 0.3

## 2015-06-07 MED ORDER — CHLORHEXIDINE GLUCONATE 0.12 % MT SOLN
15.0000 mL | Freq: Two times a day (BID) | OROMUCOSAL | Status: DC
  Administered 2015-06-07 (×3): 15 mL via OROMUCOSAL
  Filled 2015-06-07 (×3): qty 15

## 2015-06-07 MED ORDER — CETYLPYRIDINIUM CHLORIDE 0.05 % MT LIQD
7.0000 mL | Freq: Two times a day (BID) | OROMUCOSAL | Status: DC
  Administered 2015-06-07 (×2): 7 mL via OROMUCOSAL

## 2015-06-07 MED ORDER — POTASSIUM CHLORIDE CRYS ER 20 MEQ PO TBCR
40.0000 meq | EXTENDED_RELEASE_TABLET | ORAL | Status: AC
  Administered 2015-06-07 (×3): 40 meq via ORAL
  Filled 2015-06-07 (×3): qty 2

## 2015-06-07 MED ORDER — LORAZEPAM 1 MG PO TABS
1.0000 mg | ORAL_TABLET | ORAL | Status: DC | PRN
  Administered 2015-06-08: 1 mg via ORAL
  Filled 2015-06-07: qty 1

## 2015-06-07 MED ORDER — MORPHINE SULFATE (CONCENTRATE) 10 MG/0.5ML PO SOLN
5.0000 mg | ORAL | Status: DC | PRN
  Administered 2015-06-07 (×2): 5 mg via ORAL
  Filled 2015-06-07 (×3): qty 0.5

## 2015-06-07 NOTE — Evaluation (Addendum)
Clinical/Bedside Swallow Evaluation Patient Details  Name: Lindsey Gomez MRN: [id number] Date of Birth: 06-17-1945  Today's Date: 06/07/2015 Time: SLP Start Time (ACUTE ONLY): 1346 SLP Stop Time (ACUTE ONLY): 1403 SLP Time Calculation (min) (ACUTE ONLY): 17 min  Past Medical History:  Past Medical History  Diagnosis Date  . Depression   . HTN (hypertension)   . Chronic pain   . Fibromyalgia   . Migraines   . History of breast cancer     Right  . Chronic pancreatitis ([place])   . Vitamin D deficiency   . GERD (gastroesophageal reflux disease)   . Finger dislocation 2009    L 3rd  . COPD (chronic obstructive pulmonary disease) ([place])   . Anemia   . Fractured sternum 11/2008  . Osteoporosis     Reclast too expensive  . Barrett's esophagus   . Chronic fatigue   . Diverticulosis   . Opioid dependence ([place])   . 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 Abnormality of gait 10/12/2013  . Anxiety   . Depression   . Type II or unspecified type diabetes mellitus without mention of complication, not stated as uncontrolled   . Pancreatitis   . Hypoglycemia   . Lung nodules   . Chronic kidney disease   . Parkinson disease ([place])   . Parkinson's disease ([place]) 06/15/2014  . Convulsions/seizures ([place]) 10/12/2013    Possible benzodiazepine withdrawal seizure, x1-no further seizure activity   . Cancer ([place])     HX OF BREAST CANCER -chemo, radiation-right   . Malnourished [facility])    Past Surgical History:  Past Surgical History  Procedure Laterality Date  . Nasal sinus surgery    . Pancreatectomy      40%  . Cholecystectomy    . Appendectomy    . Abdominal hysterectomy    . Lobectomy Left 2005    granulomatous lesion.   . Breast lumpectomy Left   . Ercp N/A 10/05/2013    Procedure: ENDOSCOPIC RETROGRADE CHOLANGIOPANCREATOGRAPHY (ERCP);  Surgeon: Ladene Artist, MD;  Location: [facility] ENDOSCOPY;  Service: Endoscopy;  Laterality: N/A;  . Cataract extraction Bilateral   . Botox injection      recent botox  injection 12-23-13 injection for Migraines  . Cataract extraction, bilateral Bilateral     bilateral  . Eye surgery      1 eye "membrane surgery"  . Ercp N/A 01/07/2014    Procedure: ENDOSCOPIC RETROGRADE CHOLANGIOPANCREATOGRAPHY (ERCP);  Surgeon: Inda Castle, MD;  Location: [facility]OS[facility];  Service: Endoscopy;  Laterality: N/A;  . Spyglass cholangioscopy N/A 01/07/2014    Procedure: [id number] CHOLANGIOSCOPY;  Surgeon: Inda Castle, MD;  Location: [facility] ENDOSCOPY;  Service: Endoscopy;  Laterality: N/A;  . Colonoscopy    . Upper gastrointestinal endoscopy     HPI:  Lindsey Gomez is a 70 year old female with a past medical history significant for chronic pancreatitis/ pancreatic insufficiency, breast cancer ( s/p lumpectomy, radiation, and chemotherapy), chronic steroid use, diabetes mellitus type 2, CKD stage 3, HTN, COPD, memory impairment, Barrett's esophagus, Parkinsons' , chronic pain; who presents after being found unresponsive with a blood glucosose of 24.  History obtained by the patient's husband as the patient has no recollection of the events in question per MD note. Husband stated to MD that for the last couple weeks that she has had difficulty in keeping foods down with intermittent nausea and vomiting.  Difficulty controlling blood sugar reported by pt's husband.  Pt was unresponsive and brough to hospital by EMS.  Pt now a full code but is a hospice pt on 3/10 for FTT.    Assessment / Plan / Recommendation Clinical Impression  Pt presents with severe dysphagia concerning for ability to meet nutritional needs and protect airway.  Pt is grossly weak and is very xerostomic.  She complained of "being sick" but did not expand on information.  Pt reports willingness to take gingerale - given via tsp. Delayed swallow with immediate belching and coughing.  Pt also with increased shallow RR.  She appeared to tolerate ice chips overall well.  Nectar thick soda given to pt, but unfortunately, she  continued to cough.  Pt declined to consume pudding/applesauce.  Given plan of care has changed from DNR to FULL code, would recommend NPO except ice chips and medicine.  Will follow up for po readiness.  Concern for swallow ability to return to functional level is present given pt's comorbidities and dysphagia diagnosed during earlier March admit.      Aspiration Risk  Risk for inadequate nutrition/hydration;Severe aspiration risk    Diet Recommendation NPO;NPO except meds;Ice chips PRN after oral care   Postural Changes: Seated upright at 90 degrees;Remain upright for at least 30 minutes after po intake    Other  Recommendations Oral Care Recommendations: Oral care QID   Follow up Recommendations  Other (comment) (TBD dependent on goals)    Frequency and Duration min 2x/week  1 week       Prognosis Prognosis for Safe Diet Advancement: Guarded Barriers to Reach Goals: Severity of deficits;Time post onset      Swallow Study   General Date of Onset: 06/07/15 HPI: Lindsey Gomez is a 70 year old female with a past medical history significant for chronic pancreatitis/ pancreatic insufficiency, breast cancer ( s/p lumpectomy, radiation, and chemotherapy), chronic steroid use, diabetes mellitus type 2, CKD stage 3, HTN, COPD, memory impairment, Barrett's esophagus, Parkinsons' , chronic pain; who presents after being found unresponsive with a blood glucosose of 24.  History obtained by the patient's husband as the patient has no recollection of the events in question per MD note. Husband stated to MD that for the last couple weeks that she has had difficulty in keeping foods down with intermittent nausea and vomiting.  Difficulty controlling blood sugar reported by pt's husband.  Pt was unresponsive and brough to hospital by EMS.  Pt now a full code but is a hospice pt on 3/10 for FTT.  Type of Study: Bedside Swallow Evaluation Previous Swallow Assessment: MBS 05/20/15 mild deficits - flash  penetration of thin, trace residuals = thin assisted to clear rec:  regular/thin  Diet Prior to this Study: Dysphagia 1 (puree);Thin liquids Temperature Spikes Noted: No Respiratory Status: Nasal cannula History of Recent Intubation: No Behavior/Cognition: Alert;Confused Oral Cavity Assessment: Dry;Dried secretions Oral Care Completed by SLP: No Oral Cavity - Dentition: Edentulous Self-Feeding Abilities: Total assist Patient Positioning: Upright in bed Baseline Vocal Quality: Low vocal intensity Volitional Cough: Cognitively unable to elicit Volitional Swallow: Unable to elicit    Oral/Motor/Sensory Function Overall Oral Motor/Sensory Function: Mild impairment (pt leaning to right slightly, appears with decreased right facial movement, did not follow directions for oral motor exam adequately)   Ice Chips Ice chips: Impaired Presentation: Spoon Oral Phase Functional Implications: Prolonged oral transit   Thin Liquid Thin Liquid: Impaired Presentation: Spoon Oral Phase Impairments: Reduced lingual movement/coordination Pharyngeal  Phase Impairments: Suspected delayed Swallow;Cough - Delayed;Cough - Immediate Other Comments: increased work of breathing    Nectar Thick Nectar Thick  Liquid: Impaired Presentation: Spoon;Cup Oral Phase Impairments: Reduced labial seal;Reduced lingual movement/coordination Oral phase functional implications: Prolonged oral transit Pharyngeal Phase Impairments: Suspected delayed Swallow;Cough - Delayed Other Comments: increased work of breathing   Honey Thick Honey Thick Liquid: Not tested   Puree Puree: Not tested Other Comments: pt declined to accept applesauce nor pudding   Solid   GO   Solid: Not tested        Luanna Salk, [place] [facility] SLP [phone]     SLP paged MD with recommendations for NPO except ice/medicinations at 1440

## 2015-06-07 NOTE — Progress Notes (Signed)
Initial Nutrition Assessment  DOCUMENTATION CODES:   Severe malnutrition in context of chronic illness, Underweight  INTERVENTION:   - Continue providing Ensure Enlive po BID. - Encourage good PO intake of meals and supplements. - Will continue to monitor for nutrition needs.  NUTRITION DIAGNOSIS:   Malnutrition related to chronic illness as evidenced by energy intake < or equal to 75% for > or equal to 1 month, severe depletion of muscle mass, severe depletion of body fat.  GOAL:   Patient will meet greater than or equal to 90% of their needs  MONITOR:   PO intake, Supplement acceptance, Labs, Weight trends, Skin, I & O's  REASON FOR ASSESSMENT:   Malnutrition Screening Tool    ASSESSMENT:   70 year old female with PMH significant for chronic pancreatitis/ pancreatic insufficiency, breast cancer ( s/p lumpectomy, radiation, and chemo), chronic steroid use, T2DM, CKD stage 3, HTN, COPD, memory impairment, chronic pain; who presents after being found unresponsive with a blood glucose of 24. Husband states that for the last couple weeks that she has had difficulty in keeping foods down with intermittent nausea and vomiting. Her sugars have been very difficult to control.   Met with patient who was very confused and sleepy at time of visit.  No family was present to gather history.  Patient did state that she was drinking Ensure at home, already ordered for patient since hospital admission.  Per chart review, patient has been experiencing nausea and vomiting for several weeks now with poor PO intakes during this time.  Patients blood sugars have also varied per report from husband.     Nutrition Focused Physical Exam was completed.  Findings include severe muscle depletion, severe fat depletion, and no edema.  Per chart review, patient has lost 6# / 7% x 4 months, insignificant for the time frame.  Patient is not likely meeting needs currently or PTA.  Continue providing Ensure  Enlive BID and encouraging good PO intake of meals and supplements.  Medications reviewed: IVF D5-1/2NS-52mq KCl @ 75 (306 kcals), novolog, K-Dur.  Labs reviewed: elevated CBGs (129-187), potassium low (2.7), elevated BUN/creatinine, EGFR (44).  Diet Order:  DIET - DYS 1 Room service appropriate?: Yes with Assist; Fluid consistency:: Thin  Skin:  Wound (see comment) (Stg II PU on sacrum; Unstagable PU on L leg and back)  Last BM:  3/27  Height:   Ht Readings from Last 1 Encounters:  06/06/15 5' 1"  (1.549 m)    Weight:   Wt Readings from Last 1 Encounters:  06/06/15 86 lb 10.3 oz (39.3 kg)    Ideal Body Weight:  50 kg  BMI:  Body mass index is 16.38 kg/(m^2).  Estimated Nutritional Needs:   Kcal:  1200-1400  Protein:  60-70 grams  Fluid:  >/= 1.5 L  EDUCATION NEEDS:   No education needs identified at this time  CVeronda Prude Dietetic Intern Pager: [phone]

## 2015-06-07 NOTE — Plan of Care (Signed)
Problem: Health Behavior/Discharge Planning: Goal: Ability to manage health-related needs will improve Outcome: Not Progressing For D/C with Hospice care

## 2015-06-07 NOTE — Plan of Care (Signed)
Problem: Education: Goal: Knowledge of [facility] Education information/materials will improve Outcome: Not Met (add Reason) Pt with dementia , no family available

## 2015-06-07 NOTE — Consult Note (Signed)
Consultation Note Date: 06/07/2015   Patient Name: Lindsey Gomez  DOB: [birth year]  MRN: [id number]  Age / Sex: 70 y.o., female  PCP: Lajean Manes, MD Referring Physician: Barton Dubois, MD  Reason for Consultation: Establishing goals of care, Non pain symptom management, Pain control and Psychosocial/spiritual support    Clinical Assessment/Narrative:   69 year old female with a past medical history significant for chronic pancreatitis/ pancreatic insufficiency, breast cancer ( s/p lumpectomy, radiation, and chemotherapy), chronic steroid use, diabetes mellitus type 2, CKD stage 3, HTN, COPD, memory impairment, chronic pain; who presents after being found unresponsive with a blood glucose of 24.   This patient is receiving hospice services through [facility].  According to family there was some issues with having appropriate pain medications in the home and available, at time of need ensuring this patient's comfort.  They verbalize concerns that their calls were not returned in a timely manner.  I encouraged family to discuss with [facility]  their services and expectations.  Jackelyn Poling LCSW present for portion of today's' GOC and she verbalizes that treatment plan will be dicussed with the home team.    This NP Wadie Lessen reviewed medical records, received report from team, assessed the patient and then meet at the patient's bedside along with her husband and caregiver/Debra, and then daughter Ivin Booty by telephone  to discuss diagnosis prognosis, [place], EOL wishes disposition and options.   A detailed discussion was had today regarding advanced directives.  Concepts specific to code status, artifical feeding and hydration, continued IV antibiotics and rehospitalization was had.  The difference between a aggressive medical intervention path  and a palliative comfort care path for this patient at this time was had.  Values and goals of care  important to patient and family were attempted to be elicited.   Natural trajectory and expectations at EOL were discussed.  Questions and concerns addressed.   Family encouraged to call with questions or concerns.  PMT will continue to support holistically.   Contacts/Participants in Discussion: Primary Decision Maker: Husband and daughter are listed on HPOA  HCPOA: yes    SUMMARY OF RECOMMENDATIONS  -continue current treatment plan though discharge, hopefully in the am.   Hospice/ [facility] is aware  - once discharged home, focus of care is full comfort.  No life prolonging measure.  No CBGs checks, no insulin, no artificial feeding or hydration, avoid hospitalizations  -comfort feeds as tolerated  -symptom management, have medications in the home prior to need    Code Status/Advance Care Planning:  DNR      Code Status Orders        Start     Ordered   06/06/15 2022  Full code   Continuous     06/06/15 2030    Code Status History    Date Active Date Inactive Code Status Order ID Comments User Context   05/21/2015 10:47 AM 05/23/2015 11:34 PM DNR [id number]  Loistine Chance, MD Inpatient   05/18/2015 12:47 AM 05/21/2015 10:47 AM Full Code [id number]  Etta Quill, DO ED   03/31/2015  3:07 AM 04/06/2015  5:00 PM Full Code [id number]  Norval Morton, MD ED   04/09/2014  1:11 PM 04/15/2014  6:46 PM Full Code [id number]  Louellen Molder, MD Inpatient   03/11/2014  6:02 PM 03/15/2014  4:42 PM DNR [id number]  Thurnell Lose, MD Inpatient   10/01/2013  5:00 PM 10/07/2013  4:03 PM Full Code [id number]  Nita Sells, MD Inpatient  Advance Directive Documentation        Most Recent Value   Type of Advance Directive  Out of facility DNR (pink MOST or yellow form)   Pre-existing out of facility DNR order (yellow form or pink MOST form)     "MOST" Form in Place?        Other Directives:Advanced Directive and Living Will      Patient has a documented HPOA and Living Will desire for a natural  death.  Symptom Management:   Pain/Dyspnea: Roxanol 5 mg po/sl eveery 1 hr prn  Anxiety: Ativan 1 mg po every 4 hrs prn  Palliative Prophylaxis:   Aspiration, Bowel Regimen, Frequent Pain Assessment, Oral Care and Turn Reposition  Additional Recommendations (Limitations, Scope, Preferences):  Once discharged  Full Comfort Care   Psycho-social/Spiritual:  Support System: [place] Desire for further Chaplaincy support:no Additional Recommendations: Education on Hospice  Prognosis:    Less than 3 months Discharge Planning: Home with Hospice   Chief Complaint/ Primary Diagnoses: Present on Admission:  . Hypoglycemia . Type 1 diabetes mellitus ([place]) . Nausea & vomiting . Memory impairment . HCAP (healthcare-associated pneumonia) . Hypokalemia . GERD (gastroesophageal reflux disease) . Essential hypertension . Acute encephalopathy . Transient hypotension  I have reviewed the medical record, interviewed the patient and family, and examined the patient. The following aspects are pertinent.  Past Medical History  Diagnosis Date  . Depression   . HTN (hypertension)   . Chronic pain   . Fibromyalgia   . Migraines   . History of breast cancer     Right  . Chronic pancreatitis ([place])   . Vitamin D deficiency   . GERD (gastroesophageal reflux disease)   . Finger dislocation 2009    L 3rd  . COPD (chronic obstructive pulmonary disease) ([place])   . Anemia   . Fractured sternum 11/2008  . Osteoporosis     Reclast too expensive  . Barrett's esophagus   . Chronic fatigue   . Diverticulosis   . Opioid dependence ([place])   . 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 Abnormality of gait 10/12/2013  . Anxiety   . Depression   . Type II or unspecified type diabetes mellitus without mention of complication, not stated as uncontrolled   . Pancreatitis   . Hypoglycemia   . Lung nodules   . Chronic kidney disease   . Parkinson disease ([place])   . Parkinson's disease ([place]) 06/15/2014  .  Convulsions/seizures ([place]) 10/12/2013    Possible benzodiazepine withdrawal seizure, x1-no further seizure activity   . Cancer ([place])     HX OF BREAST CANCER -chemo, radiation-right   . Malnourished [facility])    Social History   Social History  . Marital Status: Married    Spouse Name: [name]  . Number of Children: 1  . Years of Education: BA   Occupational History  . Retired     Disabled Now    Social History Main Topics  . Smoking status: Current Every Day Smoker -- 1.00 packs/day for 40 years    Types: Cigarettes    Last Attempt to Quit: 01/25/2014  . Smokeless tobacco: Never Used  . Alcohol Use: No  . Drug Use: No  . Sexual Activity: No   Other Topics Concern  . None   Social History Narrative   Did not go back to work Oct 23, [name] is very ill - unable to have financial ends meet, no income      Regular Exercise -  NO      Patient is right handed.   Patient drinks 2 cups of caffeine per day.   Family History  Problem Relation Age of Onset  . COPD Father   . Heart disease Father   . CAD Father   . Cancer Father     "blood"  . Leukemia Mother   . Diabetes Maternal Uncle   . Dementia Sister   . Alzheimer's disease Sister   . Heart attack Brother   . Colon cancer Neg Hx   . Colon polyps Neg Hx   . Esophageal cancer Neg Hx    Scheduled Meds: . antiseptic oral rinse  7 mL Mouth Rinse q12n4p  . chlorhexidine  15 mL Mouth Rinse BID  . enoxaparin (LOVENOX) injection  30 mg Subcutaneous QHS  . escitalopram  10 mg Oral Daily  . feeding supplement (ENSURE ENLIVE)  237 mL Oral BID BM  . hydrocortisone  10 mg Oral BID  . insulin aspart  0-9 Units Subcutaneous 6 times per day  . memantine  14 mg Oral QHS  . pantoprazole  40 mg Oral BID  . piperacillin-tazobactam (ZOSYN)  IV  3.375 g Intravenous Q8H  . potassium chloride  40 mEq Oral Q4H  . sertraline  25 mg Oral Daily  . sodium chloride flush  3 mL Intravenous Q12H  . topiramate  100 mg Oral BID  . vancomycin  500  mg Intravenous Q24H  . Vortioxetine HBr  20 mg Oral QHS   Continuous Infusions: . dextrose 5 % and 0.45 % NaCl with KCl 40 mEq/L 75 mL/hr at 06/07/15 0939   PRN Meds:.acetaminophen, ipratropium, levalbuterol, LORazepam Medications Prior to Admission:  Prior to Admission medications   Medication Sig Start Date End Date Taking? Authorizing Provider  acetaminophen (TYLENOL) 500 MG tablet Take 500 mg by mouth every 6 (six) hours as needed for mild pain, moderate pain or headache.    Yes Historical Provider, MD  atenolol (TENORMIN) 50 MG tablet Take 1 tablet (50 mg total) by mouth at bedtime. 05/23/15  Yes Barton Dubois, MD  escitalopram (LEXAPRO) 20 MG tablet Take 0.5 tablets (10 mg total) by mouth daily. 05/23/15 05/22/16 Yes Barton Dubois, MD  gabapentin (NEURONTIN) 100 MG capsule TAKE 100 MG BY MOUTH TWICE DAILY 01/17/15  Yes Historical Provider, MD  hydrocortisone (CORTEF) 10 MG tablet Take 10 mg by mouth 2 (two) times daily. 04/13/15  Yes Historical Provider, MD  hyoscyamine (LEVBID) 0.375 MG 12 hr tablet TAKE ONE TAB TWICE A DAY Patient taking differently: Take 0.375 mg by mouth twice daily 12/13/14  Yes Inda Castle, MD  insulin aspart (NOVOLOG FLEXPEN) 100 UNIT/ML FlexPen Inject 6-9 Units into the skin 3 (three) times daily with meals. 70-160: 6 UNITS 161-220: 7 UNITS  221-280: 8 UNITS 281-340: 9 UNITS   Yes Historical Provider, MD  LEVEMIR FLEXTOUCH 100 UNIT/ML Pen Inject 15 Units into the skin at bedtime. 05/25/15  Yes Historical Provider, MD  LORazepam (ATIVAN) 1 MG tablet Take 1 tablet (1 mg total) by mouth every 6 (six) hours as needed for anxiety. Patient taking differently: Take 1 mg by mouth 3 (three) times daily.  05/23/15  Yes Barton Dubois, MD  memantine (NAMENDA XR) 14 MG CP24 24 hr capsule Take 1 capsule (14 mg total) by mouth at bedtime. 05/23/15  Yes Barton Dubois, MD  mometasone (NASONEX) 50 MCG/ACT nasal spray Place 2 sprays into both nostrils daily as needed (for congestion).     Yes Historical  Provider, MD  omeprazole (PRILOSEC) 40 MG capsule Take 40 mg by mouth daily.   Yes Historical Provider, MD  ondansetron (ZOFRAN) 8 MG tablet TAKE 1 TABLET (8 MG TOTAL) BY MOUTH 2 (TWO) TIMES DAILY AS NEEDED FOR NAUSEA 05/04/15  Yes Historical Provider, MD  sertraline (ZOLOFT) 25 MG tablet Take 25 mg by mouth daily.   Yes Historical Provider, MD  topiramate (TOPAMAX) 100 MG tablet Take 100 mg by mouth 2 (two) times daily. 05/15/15  Yes Historical Provider, MD  triamcinolone (KENALOG) 0.1 % ointment Apply 1 application topically 2 (two) times daily as needed (itching).    Yes Historical Provider, MD  Vortioxetine HBr (TRINTELLIX) 20 MG TABS Take 20 mg by mouth at bedtime.   Yes Historical Provider, MD  HYDROcodone-homatropine (HYCODAN) 5-1.5 MG/5ML syrup Take 5 mLs by mouth every 6 (six) hours as needed for cough. Patient not taking: Reported on 06/06/2015 05/23/15   Barton Dubois, MD  loperamide (IMODIUM) 2 MG capsule Take 1 capsule (2 mg total) by mouth as needed for diarrhea or loose stools. Patient not taking: Reported on 06/06/2015 05/23/15   Barton Dubois, MD   Allergies  Allergen Reactions  . Milnacipran Swelling and Other (See Comments)    Headache and hand swelling   . Rizatriptan Benzoate Nausea And Vomiting  . Gabapentin Other (See Comments)    Weak muscles  . Moxifloxacin Other (See Comments)    Unknown  . Sulfonamide Derivatives Rash  . Venlafaxine Other (See Comments)    Unknown    Review of Systems  Unable to perform ROS: Mental status change    Physical Exam  Constitutional: She appears lethargic. She appears cachectic. She appears ill.  Cardiovascular: Tachycardia present.   Respiratory: She has decreased breath sounds in the right lower field and the left lower field.  Musculoskeletal:  generalized weakness, noted atrophy  Neurological: She appears lethargic.  Skin: Skin is warm and dry.  Psychiatric: Cognition and memory are impaired.    Vital  Signs: BP 120/53 mmHg  Pulse 64  Temp(Src) 98.3 F (36.8 C) (Oral)  Resp 31  Ht 5\' 1"  (1.549 m)  Wt 39.3 kg (86 lb 10.3 oz)  BMI 16.38 kg/m2  SpO2 95%  SpO2: SpO2: 95 % O2 Device:SpO2: 95 % O2 Flow Rate: .O2 Flow Rate (L/min): 3 L/min  IO: Intake/output summary:  Intake/Output Summary (Last 24 hours) at 06/07/15 1438 Last data filed at 06/07/15 1200  Gross per 24 hour  Intake 5873.75 ml  Output    650 ml  Net 5223.75 ml    LBM: Last BM Date: 06/07/15 Baseline Weight: Weight: 39.3 kg (86 lb 10.3 oz) Most recent weight: Weight: 39.3 kg (86 lb 10.3 oz)      Palliative Assessment/Data:    Additional Data Reviewed:  CBC:    Component Value Date/Time   WBC 11.5* 06/07/2015 0606   WBC 8.7 09/06/2011 1600   WBC 6.8 11/07/2010 1309   HGB 13.7 06/07/2015 0606   HGB 10.6* 09/06/2011 1600   HGB 10.7* 11/07/2010 1309   HCT 38.7 06/07/2015 0606   HCT 35.4* 09/06/2011 1600   HCT 32.5* 11/07/2010 1309   PLT 295 06/07/2015 0606   PLT 190 11/07/2010 1309   MCV 89.8 06/07/2015 0606   MCV 83.4 09/06/2011 1600   MCV 82.7 11/07/2010 1309   NEUTROABS 7.9* 06/06/2015 1759   NEUTROABS 4.4 11/07/2010 1309   LYMPHSABS 3.0 06/06/2015 1759   LYMPHSABS 1.9 11/07/2010 1309   MONOABS 0.6 06/06/2015 1759  MONOABS 0.4 11/07/2010 1309   EOSABS 0.0 06/06/2015 1759   EOSABS 0.1 11/07/2010 1309   BASOSABS 0.0 06/06/2015 1759   BASOSABS 0.0 11/07/2010 1309   Comprehensive Metabolic Panel:    Component Value Date/Time   NA 139 06/07/2015 0606   K 2.7* 06/07/2015 0606   CL 110 06/07/2015 0606   CO2 15* 06/07/2015 0606   BUN 43* 06/07/2015 0606   CREATININE 1.40* 06/07/2015 0606   GLUCOSE 153* 06/07/2015 0606   CALCIUM 7.4* 06/07/2015 0606   AST 156* 06/07/2015 0606   ALT 78* 06/07/2015 0606   ALKPHOS 187* 06/07/2015 0606   BILITOT 0.6 06/07/2015 0606   PROT 5.5* 06/07/2015 0606   ALBUMIN 1.8* 06/07/2015 0606   Discussed with Dr Dyann Kief  Time In: 1530 Time Out: 1700 Time  Total: 90 min Greater than 50%  of this time was spent counseling and coordinating care related to the above assessment and plan.  Signed by: Wadie Lessen, NP  Knox Royalty, NP  06/07/2015, 2:38 PM  Please contact Palliative Medicine Team phone at [phone] for questions and concerns.

## 2015-06-07 NOTE — Progress Notes (Signed)
[facility] PROGRESS NOTE  Lindsey Gomez [id number] DOB: Dec 26, 1945 DOA: 06/06/2015 PCP: Mathews Argyle, MD  Assessment/Plan: Hypoglycemia with history of diabetes mellitus type1: Acute. Patient noted to not being able to eat and drinking well at home reported to be having recurrent nausea and vomiting with erratic blood sugars. Patient initial blood sugars 24 with recurrent episodes of hypoglycemia after that. -resulting in need for D5 1/2 NS drip. -will keep in stepdown, until [place] meeting completed - Hypoglycemic protocols with every hour CBG's initiated.  Acute toxic/metabolic encephalopathy: Most likely secondary to hypoglycemia on top of underlying dementia/organic brain dysfunction and possible PNA -Continue to monitor -after sugar levels restored, patient oriented X 1  Severe Hypokalemia:  -continue IV fluids with K and replete as needed -patient not eating and with failure to thrive and intermittent GI loses at baseline    Suspected HCAP: Acute. Patient with recent admission and concerns for aspiration/HCAP -partially treated at that time and subsequently transitioned to comfort care only -now asking for treatment -mild hypothermia in setting of hypoglycemia most likely  -started on broad spectrum antibiotics, which will continue until [place] meeting (planned for 3:00pm today 3/28)  Transient hypotension with history of essential hypertension: Resolved. Initial blood pressure 74/53 with IV fluids 3.5 L of normal saline IV fluids while in the emergency department. - holding atenolol for now  -BP is stable after IVF's  Chronic steroid use secondary to a history of adrenal insufficiency - Continue hydrocortisone for now  Nausea and vomiting: Acute on chronic. Patient has had no nausea and vomiting since being in the ED. -NPO at this time until able to be evaluated by speech -Continue to monitor -continue PRN antiemetics  Memory impairment -Continue  [place]  Chronic kidney disease stage III:  -stable. -continue to monitor  History of dysplasia: Patient previously evaluated during earlier admission. Recommendations as seen below: -at high risk for aspiration -last decision was for comfort feeding  -will follow [place] meeting  Prolonged QT: Acute. Patient's QTc was elevated at 591 on admission.  -Replacing electrolytes now of potassium and initial troponins negative. -Check magnesium   Anxiety /Depression - Continue Zoloft, vortioxetine, and Ativan prn  Chronic severe protein malnutrition: Acutely worsen as patient's albumin on admission 1.5, previously 2.5. -continue feeding supplements as recommended by nutritional service -follow [place] meeting -patient not eating much and has steadily decline in the last couple months   GERD -continue PPI for now  Hospice care: Patient was previously reported be on hospice care since her last admission earlier this month. - palliative care consulted requested -as per husband request currently full code and actively demanding for treatment       Code Status: Full code now Family Communication: husband and daughter by phone  Disposition Plan: stat consultation with Palliative care requested; will continue current therapy as husband is asking for full code and treatment currently. Patient was initially DNR/DNI and full comfort care only just 14 days ago (follow [facility] hospice of [facility])   Consultants:  Palliative Care  Procedures:  See below for x-ray reports  Antibiotics:  vanc and zosyn 3/27  HPI/Subjective: In no acute distress, per nursing staff not eating much; in no distress and currently afebrile.  Objective: Filed Vitals:   06/07/15 0900 06/07/15 1223  BP:  120/53  Pulse:    Temp:    Resp: 22 31    Intake/Output Summary (Last 24 hours) at 06/07/15 1236 Last data filed at 06/07/15 1200  Gross per 24 hour  Intake 5873.75 ml  Output    650 ml  Net 5223.75 ml    Filed Weights   06/06/15 2219  Weight: 39.3 kg (86 lb 10.3 oz)    Exam:   General:chronically ill,oriented to person only, poor insight, no acute distress and afebrile.  Cardiovascular: no rubs, no gallops  Respiratory:scattered rhonchi, no wheezing, no crackles  Abdomen: soft, NT, ND, positive BS  Musculoskeletal: 2-3++ edema, no joint swelling  Data Reviewed: Basic Metabolic Panel:  Recent Labs Lab 06/06/15 1759 06/06/15 2014 06/06/15 2257 06/07/15 0606  NA 140  --   --  139  K <2.0*  --  2.4* 2.7*  CL 114*  --   --  110  CO2 17*  --   --  15*  GLUCOSE 34*  --   --  153*  BUN 48*  --   --  43*  CREATININE 1.31*  --   --  1.40*  CALCIUM 6.8*  --   --  7.4*  MG  --  1.7  --   --    Liver Function Tests:  Recent Labs Lab 06/06/15 1759 06/07/15 0606  AST 73* 156*  ALT 34 78*  ALKPHOS 129* 187*  BILITOT 0.4 0.6  PROT 4.7* 5.5*  ALBUMIN 1.5* 1.8*    Recent Labs Lab 06/06/15 2014  LIPASE 18   CBC:  Recent Labs Lab 06/06/15 1759 06/07/15 0606  WBC 11.5* 11.5*  NEUTROABS 7.9*  --   HGB 10.5* 13.7  HCT 29.5* 38.7  MCV 90.5 89.8  PLT 277 295   CBG:  Recent Labs Lab 06/06/15 2315 06/07/15 0008 06/07/15 0112 06/07/15 0256 06/07/15 0745  GLUCAP 105* 134* 187* 183* 129*    Recent Results (from the past 240 hour(s))  Culture, blood (Routine X 2) w Reflex to ID Panel     Status: None (Preliminary result)   Collection Time: 06/06/15  5:59 PM  Result Value Ref Range Status   Specimen Description BLOOD RIGHT FEM  Final   Special Requests BOTTLES DRAWN AEROBIC AND ANAEROBIC 5CC  Final   Culture   Final    NO GROWTH < 12 HOURS Performed at [facility]    Report Status PENDING  Incomplete  Culture, blood (Routine X 2) w Reflex to ID Panel     Status: None (Preliminary result)   Collection Time: 06/06/15  7:21 PM  Result Value Ref Range Status   Specimen Description BLOOD RIGHT ARM  Final   Special Requests IN PEDIATRIC BOTTLE 0.5  CC  Final   Culture   Final    NO GROWTH < 12 HOURS Performed at [facility]    Report Status PENDING  Incomplete  MRSA PCR Screening     Status: None   Collection Time: 06/06/15 10:20 PM  Result Value Ref Range Status   MRSA by PCR NEGATIVE NEGATIVE Final    Comment:        The GeneXpert MRSA Assay (FDA approved for NASAL specimens only), is one component of a comprehensive MRSA colonization surveillance program. It is not intended to diagnose MRSA infection nor to guide or monitor treatment for MRSA infections.      Studies: Dg Chest Port 1 View  06/06/2015  CLINICAL DATA:  According to notes pt was found to be unresponsive. Hx of diabetes, parkinsons, and breast ca. EXAM: PORTABLE CHEST - 1 VIEW COMPARISON:  05/17/2015 FINDINGS: New interstitial and airspace opacities in the right lower lobe, obscuring some of  the nodules seen previously. Coarse opacities at the left lung base persist. Heart size remains normal. Underlying pulmonary emphysema with attenuated bronchovascular markings. Staple line in the left mid lung. No pneumothorax. No effusion. Old right rib fractures. No effusion. Surgical clips in the upper abdomen. IMPRESSION: 1. New right lower lung interstitial and airspace disease suggesting pneumonia versus asymmetric edema. Electronically Signed   By: Lucrezia Europe M.D.   On: 06/06/2015 16:21    Scheduled Meds: . antiseptic oral rinse  7 mL Mouth Rinse q12n4p  . chlorhexidine  15 mL Mouth Rinse BID  . enoxaparin (LOVENOX) injection  30 mg Subcutaneous QHS  . escitalopram  10 mg Oral Daily  . feeding supplement (ENSURE ENLIVE)  237 mL Oral BID BM  . hydrocortisone  10 mg Oral BID  . insulin aspart  0-9 Units Subcutaneous 6 times per day  . memantine  14 mg Oral QHS  . pantoprazole  40 mg Oral BID  . piperacillin-tazobactam (ZOSYN)  IV  3.375 g Intravenous Q8H  . potassium chloride  40 mEq Oral Q4H  . sertraline  25 mg Oral Daily  . sodium chloride flush  3 mL  Intravenous Q12H  . topiramate  100 mg Oral BID  . vancomycin  500 mg Intravenous Q24H  . Vortioxetine HBr  20 mg Oral QHS   Continuous Infusions: . dextrose 5 % and 0.45 % NaCl with KCl 40 mEq/L 75 mL/hr at 06/07/15 [id number]    Principal Problem:   Hypoglycemia Active Problems:   Type 1 diabetes mellitus (HCC)   Essential hypertension   GERD (gastroesophageal reflux disease)   HCAP (healthcare-associated pneumonia)   Memory impairment   Hypokalemia   Nausea & vomiting   Acute encephalopathy   Transient hypotension    Time spent: 45 minutes (more than 50% of time dedicated to face to face, discussion with husband and daughter about patient's conditions/update, coordination of care and discussion with consultants)    Barton Dubois  [facility] Pager [phone]. If 7PM-7AM, please contact night-coverage at [email or web address], password [facility] 06/07/2015, 12:36 PM  LOS: 1 day

## 2015-06-07 NOTE — Progress Notes (Signed)
[facility] MSW note: Pt is a current [facility] home care patient. Pt admitted to [facility] on 05/24/15. Pt lives with spouse. They have live in agency sitter care through [place]. MSW spoke with daughter-Sharon prior to visit who desires comfort care only for pt. Daughter is an only child and lives in [place]. MSW attended Goals of Care meeting with patient, spouse-Ralph, hired sitter-Deborah and Wadie Lessen, NP. Goal is take pt home with continued hospice services. Spouse agrees to pt being a DNR again. Spouse agrees to pt having no more blood sugar checks and receiving no more insulin. Spouse agrees to comfort care. MSW offered support to pt and spouse. Spouse declines hospital bed for pt at this time. Pt will need PTAR transport back home tomorrow arranged by hospital staff.   Marilynne Halsted, MSW

## 2015-06-07 NOTE — Progress Notes (Signed)
CRITICAL VALUE ALERT  Critical value received:  Potassium 2.4  Date of notification:  06/06/2015  Time of notification:  2330  Critical value read back:Yes.    Nurse who received alert:  Odis Hollingshead RN   MD notified (1st page):  [facility] NP Baltazar Najjar  Time of first page:  2340  MD notified (2nd page):  Time of second page:  Responding MD:  [facility] NP Baltazar Najjar   Time MD responded:  [phone]

## 2015-06-07 NOTE — Progress Notes (Signed)
[facility]  -ICU SD  1226  [facility] GIP RN visit.  This is a GIP-Related, Covered admission from 06/06/15  With  hospice diagnosis of protein calorie manutrition.  Code status changed in the ED from DNR to Full code.  Patient was seen at the bedside.  No family present.   She did respond but did not open her eyes.  She was able to respond verbally to simple questions.   Per Staff RN Bertram Millard  the patient is having difficulty swallowing pills. She is receiving IV Zosyn and IV Vancomycin for management of UTI and pneumonia.  She has IV fluids via PIV.  Patient was admitted through the ED with hypoglycemia.   She is on 02 and has a foley in place.   She is noted to have a moist congested cough.  Urine is thick, concentrated and has a pink-tinged hue.  She denied pain. She did receive one dose of ativan 1mg  since admission.   [facility] will continue to follow patient daily until discharge. Please call with any hospice related questions or concerns.   Mickie Kay, [facility]  [phone]

## 2015-06-08 DIAGNOSIS — G43A0 Cyclical vomiting, not intractable: Secondary | ICD-10-CM

## 2015-06-08 DIAGNOSIS — Z515 Encounter for palliative care: Secondary | ICD-10-CM

## 2015-06-08 DIAGNOSIS — G9340 Encephalopathy, unspecified: Secondary | ICD-10-CM

## 2015-06-08 DIAGNOSIS — I10 Essential (primary) hypertension: Secondary | ICD-10-CM

## 2015-06-08 DIAGNOSIS — E162 Hypoglycemia, unspecified: Secondary | ICD-10-CM

## 2015-06-08 DIAGNOSIS — R627 Adult failure to thrive: Secondary | ICD-10-CM | POA: Insufficient documentation

## 2015-06-08 DIAGNOSIS — J189 Pneumonia, unspecified organism: Secondary | ICD-10-CM | POA: Insufficient documentation

## 2015-06-08 DIAGNOSIS — Z66 Do not resuscitate: Secondary | ICD-10-CM

## 2015-06-08 LAB — GLUCOSE, CAPILLARY
GLUCOSE-CAPILLARY: 158 mg/dL — AB (ref 65–99)
GLUCOSE-CAPILLARY: 138 mg/dL — AB (ref 65–99)
GLUCOSE-CAPILLARY: 91 mg/dL (ref 65–99)
Glucose-Capillary: 147 mg/dL — ABNORMAL HIGH (ref 65–99)
Glucose-Capillary: 86 mg/dL (ref 65–99)

## 2015-06-08 MED ORDER — MORPHINE SULFATE (CONCENTRATE) 10 MG /0.5 ML PO SOLN: 20.0000 mg | ORAL | Status: AC | PRN

## 2015-06-08 NOTE — Progress Notes (Signed)
[facility] MSW note: Met with patient who was easily aroused today. Pt was more alert today than yesterday. Pt denied pain. Pt eager to go home to be with spouse. No DME desired at this time. [facility] services will continue at home. MSW provided reassurance to pt of continued hospice care once at home. MSW discussed discharge plans with [name], staff RN. Pt will need transport back home. Please call with questions or concerns.   Lindsey Gomez, MSW

## 2015-06-08 NOTE — Progress Notes (Signed)
SLP Cancellation Note  Patient Details Name: Lindsey Gomez MRN: [id number] DOB: 1945/04/04   Cancelled treatment:       Reason Eval/Treat Not Completed:  (not change of care plan to DNR and home with hospice, will sign off - please reorder)   Luanna Salk, [place] [facility] SLP [phone]

## 2015-06-08 NOTE — Discharge Summary (Signed)
Physician Discharge Summary  ELENORA HOEFS [id number] DOB: [birth year] DOA: 06/06/2015  PCP: Mathews Argyle, MD  Admit date: 06/06/2015 Discharge date: 06/08/2015  Time spent: 35 minutes  Recommendations for Outpatient Follow-up:  1. Will go home with hospice for comfort care.   Discharge Diagnoses:  Principal Problem:   Hypoglycemia Active Problems:   Type 1 diabetes mellitus (HCC)   Essential hypertension   GERD (gastroesophageal reflux disease)   HCAP (healthcare-associated pneumonia)   Memory impairment   Hypokalemia   Nausea & vomiting   Acute encephalopathy   Transient hypotension   Palliative care encounter   DNR (do not resuscitate)   Community acquired pneumonia   Adult failure to thrive   Discharge Condition: poor  Diet recommendation: comfort feeds  Filed Weights   06/06/15 2219  Weight: 39.3 kg (86 lb 10.3 oz)    History of present illness:  70 year old female with past medical history of chronic pancreatitis chronic steroids use memory impairment recently discharged from the hospital for intractable nausea and vomiting that came into the hospital unresponsive and was found to be severely hypoglycemic with blood sugar of 24.  Hospital Course:  Hypoglycemia: Initial blood sugar of 24 on admission with recurrent episode of epilepsy and after that, currently on D5 half-normal saline. Palliative care was consulted, and family agreed to move towards comfort care with hospice at home. The agreed to DC IV antibiotics and make the patient DNR/DNI.  Acute metabolic toxic encephalopathy: Likely due to severe hypoglycemia superimposes underlying dementia and possibly pneumonia. Now resolved.  Severe hypokalemia: Likely due to poor oral nutrition.  Suspected healthcare associated pneumonia: With a recent admission for aspiration pneumonia, partially treated On admission She was started on broad-spectrum antibiotics, after discussion with hospice and  palliative care family decided to stop antibiotics and move towards comfort care.  Essential hypertension: Initially had hypotensive episode on admission.  Procedures:  CXR  Consultations:  PMT  Discharge Exam: Filed Vitals:   06/08/15 0402 06/08/15 0700  BP: 106/75   Pulse:    Temp: 98 F (36.7 C) 98.9 F (37.2 C)  Resp: 20     General: A&O x2  Cardiovascular: RRR Respiratory: Good air movement and clear to auscultation.  Discharge Instructions   Discharge Instructions    Diet - low sodium heart healthy    Complete by:  As directed      Increase activity slowly    Complete by:  As directed           Current Discharge Medication List    CONTINUE these medications which have NOT CHANGED   Details  escitalopram (LEXAPRO) 20 MG tablet Take 0.5 tablets (10 mg total) by mouth daily.   Associated Diagnoses: Generalized anxiety disorder    hydrocortisone (CORTEF) 10 MG tablet Take 10 mg by mouth 2 (two) times daily. Refills: 12    ondansetron (ZOFRAN) 8 MG tablet TAKE 1 TABLET (8 MG TOTAL) BY MOUTH 2 (TWO) TIMES DAILY AS NEEDED FOR NAUSEA Refills: 3    sertraline (ZOLOFT) 25 MG tablet Take 25 mg by mouth daily.      STOP taking these medications     acetaminophen (TYLENOL) 500 MG tablet      atenolol (TENORMIN) 50 MG tablet      gabapentin (NEURONTIN) 100 MG capsule      hyoscyamine (LEVBID) 0.375 MG 12 hr tablet      insulin aspart (NOVOLOG FLEXPEN) 100 UNIT/ML FlexPen      LEVEMIR FLEXTOUCH 100 UNIT/ML  Pen      LORazepam (ATIVAN) 1 MG tablet      memantine (NAMENDA XR) 14 MG CP24 24 hr capsule      mometasone (NASONEX) 50 MCG/ACT nasal spray      omeprazole (PRILOSEC) 40 MG capsule      topiramate (TOPAMAX) 100 MG tablet      triamcinolone (KENALOG) 0.1 % ointment      Vortioxetine HBr (TRINTELLIX) 20 MG TABS      HYDROcodone-homatropine (HYCODAN) 5-1.5 MG/5ML syrup      loperamide (IMODIUM) 2 MG capsule        Allergies  Allergen  Reactions  . Milnacipran Swelling and Other (See Comments)    Headache and hand swelling   . Rizatriptan Benzoate Nausea And Vomiting  . Gabapentin Other (See Comments)    Weak muscles  . Moxifloxacin Other (See Comments)    Unknown  . Sulfonamide Derivatives Rash  . Venlafaxine Other (See Comments)    Unknown      The results of significant diagnostics from this hospitalization (including imaging, microbiology, ancillary and laboratory) are listed below for reference.    Significant Diagnostic Studies: Dg Chest 2 View  05/17/2015  CLINICAL DATA:  Nausea, diarrhea, and decreased urine output for 1 week. Breast carcinoma. EXAM: CHEST  2 VIEW COMPARISON:  03/31/2015 FINDINGS: Severe emphysema is again demonstrated. New left lower lobe airspace disease is seen, suspicious for pneumonia. There is a persistent 3.5 cm nodular opacity in the right perihilar region, suspicious for neoplasm. Pleural-parenchymal scarring also seen in this region. Heart size is within normal limits. Tiny left pleural effusion versus pleural thickening again noted. IMPRESSION: New left lower lobe airspace disease, suspicious for pneumonia. Severe emphysema. Persistent 3.5 cm nodular opacity in right perihilar region, suspicious for neoplasm. Chest CT with contrast is suggested for further evaluation. Electronically Signed   By: Earle Gell M.D.   On: 05/17/2015 17:58   Dg Chest Port 1 View  06/06/2015  CLINICAL DATA:  According to notes pt was found to be unresponsive. Hx of diabetes, parkinsons, and breast ca. EXAM: PORTABLE CHEST - 1 VIEW COMPARISON:  05/17/2015 FINDINGS: New interstitial and airspace opacities in the right lower lobe, obscuring some of the nodules seen previously. Coarse opacities at the left lung base persist. Heart size remains normal. Underlying pulmonary emphysema with attenuated bronchovascular markings. Staple line in the left mid lung. No pneumothorax. No effusion. Old right rib fractures. No  effusion. Surgical clips in the upper abdomen. IMPRESSION: 1. New right lower lung interstitial and airspace disease suggesting pneumonia versus asymmetric edema. Electronically Signed   By: Lucrezia Europe M.D.   On: 06/06/2015 16:21   Dg Swallowing Func-speech Pathology  05/20/2015  Objective Swallowing Evaluation: Type of Study: MBS-Modified Barium Swallow Study Patient Details Name: ZONA MICHALIK MRN: [id number] Date of Birth: 1946/02/01 Today's Date: 05/20/2015 Time: SLP Start Time (ACUTE ONLY): 1315-SLP Stop Time (ACUTE ONLY): 1330 SLP Time Calculation (min) (ACUTE ONLY): 15 min Past Medical History: Past Medical History Diagnosis Date . Depression  . HTN (hypertension)  . Chronic pain  . Fibromyalgia  . Migraines  . History of breast cancer    Right . Chronic pancreatitis ([place])  . Vitamin D deficiency  . GERD (gastroesophageal reflux disease)  . Finger dislocation 2009   L 3rd . COPD (chronic obstructive pulmonary disease) ([place])  . Anemia  . Fractured sternum 11/2008 . Osteoporosis    Reclast too expensive . Barrett's esophagus  . Chronic fatigue  .  Diverticulosis  . Opioid dependence ([place])  . Memory disorder 10/12/2013 . Abnormality of gait 10/12/2013 . Anxiety  . Depression  . Type II or unspecified type diabetes mellitus without mention of complication, not stated as uncontrolled  . Pancreatitis  . Hypoglycemia  . Lung nodules  . Chronic kidney disease  . Parkinson disease ([place])  . Parkinson's disease ([place]) 06/15/2014 . Convulsions/seizures ([place]) 10/12/2013   Possible benzodiazepine withdrawal seizure, x1-no further seizure activity  . Cancer ([place])    HX OF BREAST CANCER -chemo, radiation-right  . Malnourished [facility])  Past Surgical History: Past Surgical History Procedure Laterality Date . Nasal sinus surgery   . Pancreatectomy     40% . Cholecystectomy   . Appendectomy   . Abdominal hysterectomy   . Lobectomy Left 2005   granulomatous lesion.  . Breast lumpectomy Left  . Ercp N/A 10/05/2013   Procedure: ENDOSCOPIC  RETROGRADE CHOLANGIOPANCREATOGRAPHY (ERCP);  Surgeon: Ladene Artist, MD;  Location: [facility] ENDOSCOPY;  Service: Endoscopy;  Laterality: N/A; . Cataract extraction Bilateral  . Botox injection     recent botox injection 12-23-13 injection for Migraines . Cataract extraction, bilateral Bilateral    bilateral . Eye surgery     1 eye "membrane surgery" . Ercp N/A 01/07/2014   Procedure: ENDOSCOPIC RETROGRADE CHOLANGIOPANCREATOGRAPHY (ERCP);  Surgeon: Inda Castle, MD;  Location: [facility] ENDOSCOPY;  Service: Endoscopy;  Laterality: N/A; . Spyglass cholangioscopy N/A 01/07/2014   Procedure: [id number] CHOLANGIOSCOPY;  Surgeon: Inda Castle, MD;  Location: [facility] ENDOSCOPY;  Service: Endoscopy;  Laterality: N/A; . Colonoscopy   . Upper gastrointestinal endoscopy   HPI: Patient is a 70 y.o. female with PMH: chronic pancreatitis, pancreatic insufficiency, chronic pain, non-compliance with meds, who presented to ED with c/o generalized weakness, nausea, vomiting, diarrhea, and poor PO intake. Patient with recent admission for PNA in January of this year. Subjective: pleasant and cooperative Assessment / Plan / Recommendation CHL IP CLINICAL IMPRESSIONS 05/20/2015 Therapy Diagnosis Mild oral phase dysphagia;Mild pharyngeal phase dysphagia Clinical Impression Patient presents with a mild oral and mild pharyngeal dysphagia. Oral dysphagia is characterized by decreased lingual ROM and ability to transit pill anteriorly to posterior in oral cavity as well as decreased mastication and oral manipulation of hard solid. Patient's pharyngeal dysphagia was characterized by swallow initiation delays to vallecular sinus, one instance of flash penetration of thin liquids via straw sip, and mild amount of residuals in vallecular sinus with regular, puree and nectar thick liquids, and trace-mild vallecular residuals with thin liquids. Patient exhibited full clearance of pharyngeal residuals with sip of thin liquids.  Impact on safety and function  Mild aspiration risk   CHL IP TREATMENT RECOMMENDATION 05/20/2015 Treatment Recommendations Therapy as outlined in treatment plan below   Prognosis 05/20/2015 Prognosis for Safe Diet Advancement Good Barriers to Reach Goals -- Barriers/Prognosis Comment -- CHL IP DIET RECOMMENDATION 05/20/2015 SLP Diet Recommendations Regular solids;Thin liquid Liquid Administration via Cup;Straw Medication Administration Crushed with puree Compensations Minimize environmental distractions;Slow rate;Small sips/bites Postural Changes Remain semi-upright after after feeds/meals (Comment);Seated upright at 90 degrees   CHL IP OTHER RECOMMENDATIONS 05/20/2015 Recommended Consults -- Oral Care Recommendations Oral care BID Other Recommendations --   CHL IP FOLLOW UP RECOMMENDATIONS 05/20/2015 Follow up Recommendations None   CHL IP FREQUENCY AND DURATION 05/20/2015 Speech Therapy Frequency (ACUTE ONLY) min 1 x/week Treatment Duration 1 week      CHL IP ORAL PHASE 05/20/2015 Oral Phase Impaired Oral - Pudding Teaspoon -- Oral - Pudding Cup -- Oral - Honey  Teaspoon -- Oral - Honey Cup -- Oral - Nectar Teaspoon -- Oral - Nectar Cup -- Oral - Nectar Straw -- Oral - Thin Teaspoon -- Oral - Thin Cup -- Oral - Thin Straw -- Oral - Puree -- Oral - Mech Soft -- Oral - Regular Impaired mastication;Delayed oral transit Oral - Multi-Consistency -- Oral - Pill Weak lingual manipulation;Other (Comment) Oral Phase - Comment --  CHL IP PHARYNGEAL PHASE 05/20/2015 Pharyngeal Phase Impaired Pharyngeal- Pudding Teaspoon -- Pharyngeal -- Pharyngeal- Pudding Cup -- Pharyngeal -- Pharyngeal- Honey Teaspoon -- Pharyngeal -- Pharyngeal- Honey Cup -- Pharyngeal -- Pharyngeal- Nectar Teaspoon -- Pharyngeal -- Pharyngeal- Nectar Cup Delayed swallow initiation-vallecula;Pharyngeal residue - valleculae Pharyngeal -- Pharyngeal- Nectar Straw -- Pharyngeal -- Pharyngeal- Thin Teaspoon -- Pharyngeal -- Pharyngeal- Thin Cup Delayed swallow initiation-vallecula Pharyngeal --  Pharyngeal- Thin Straw Delayed swallow initiation-vallecula;Penetration/Aspiration during swallow Pharyngeal Material enters airway, remains ABOVE vocal cords then ejected out Pharyngeal- Puree Delayed swallow initiation-vallecula;Pharyngeal residue - valleculae;Compensatory strategies attempted (with notebox) Pharyngeal -- Pharyngeal- Mechanical Soft -- Pharyngeal -- Pharyngeal- Regular Delayed swallow initiation-vallecula;Pharyngeal residue - valleculae;Compensatory strategies attempted (with notebox) Pharyngeal -- Pharyngeal- Multi-consistency -- Pharyngeal -- Pharyngeal- Pill Other (Comment) Pharyngeal -- Pharyngeal Comment --  CHL IP CERVICAL ESOPHAGEAL PHASE 05/20/2015 Cervical Esophageal Phase WFL Pudding Teaspoon -- Pudding Cup -- Honey Teaspoon -- Honey Cup -- Nectar Teaspoon -- Nectar Cup -- Nectar Straw -- Thin Teaspoon -- Thin Cup -- Thin Straw -- Puree -- Mechanical Soft -- Regular -- Multi-consistency -- Pill -- Cervical Esophageal Comment -- No flowsheet data found. [name] 05/20/2015, 4:03 PM    [name], [place], [place] 05/20/2015 4:04 PM            Microbiology: Recent Results (from the past 240 hour(s))  Culture, blood (Routine X 2) w Reflex to ID Panel     Status: None (Preliminary result)   Collection Time: 06/06/15  5:59 PM  Result Value Ref Range Status   Specimen Description BLOOD RIGHT FEM  Final   Special Requests BOTTLES DRAWN AEROBIC AND ANAEROBIC 5CC  Final   Culture   Final    NO GROWTH < 12 HOURS Performed at [facility]    Report Status PENDING  Incomplete  Culture, blood (Routine X 2) w Reflex to ID Panel     Status: None (Preliminary result)   Collection Time: 06/06/15  7:21 PM  Result Value Ref Range Status   Specimen Description BLOOD RIGHT ARM  Final   Special Requests IN PEDIATRIC BOTTLE 0.5 CC  Final   Culture   Final    NO GROWTH < 12 HOURS Performed at [facility]    Report Status PENDING  Incomplete  MRSA PCR Screening      Status: None   Collection Time: 06/06/15 10:20 PM  Result Value Ref Range Status   MRSA by PCR NEGATIVE NEGATIVE Final    Comment:        The GeneXpert MRSA Assay (FDA approved for NASAL specimens only), is one component of a comprehensive MRSA colonization surveillance program. It is not intended to diagnose MRSA infection nor to guide or monitor treatment for MRSA infections.      Labs: Basic Metabolic Panel:  Recent Labs Lab 06/06/15 1759 06/06/15 2014 06/06/15 2257 06/07/15 0606  NA 140  --   --  139  K <2.0*  --  2.4* 2.7*  CL 114*  --   --  110  CO2 17*  --   --  15*  GLUCOSE 34*  --   --  153*  BUN 48*  --   --  43*  CREATININE 1.31*  --   --  1.40*  CALCIUM 6.8*  --   --  7.4*  MG  --  1.7  --   --    Liver Function Tests:  Recent Labs Lab 06/06/15 1759 06/07/15 0606  AST 73* 156*  ALT 34 78*  ALKPHOS 129* 187*  BILITOT 0.4 0.6  PROT 4.7* 5.5*  ALBUMIN 1.5* 1.8*    Recent Labs Lab 06/06/15 2014  LIPASE 18   No results for input(s): AMMONIA in the last 168 hours. CBC:  Recent Labs Lab 06/06/15 1759 06/07/15 0606  WBC 11.5* 11.5*  NEUTROABS 7.9*  --   HGB 10.5* 13.7  HCT 29.5* 38.7  MCV 90.5 89.8  PLT 277 295   Cardiac Enzymes: No results for input(s): CKTOTAL, CKMB, CKMBINDEX, TROPONINI in the last 168 hours. BNP: BNP (last 3 results) No results for input(s): BNP in the last 8760 hours.  ProBNP (last 3 results) No results for input(s): PROBNP in the last 8760 hours.  CBG:  Recent Labs Lab 06/07/15 1232 06/07/15 1549 06/07/15 1929 06/07/15 2347 06/08/15 0736  GLUCAP 123* 179* 131* 86 147*       Signed:  Charlynne Cousins MD.  [facility] 06/08/2015, 8:15 AM

## 2015-06-08 NOTE — Care Management Note (Signed)
Case Management Note  Patient Details  Name: Lindsey Gomez MRN: [id number] Date of Birth: [birth year]  Subjective/Objective:  Spoke to spouse on phone @ home abut d/c back home-confirmed address.[facility] to f/u @ home. Face sheet, med necc form on chart.Secy to call [facility].                  Action/Plan:d/c home w/[facility]   Expected Discharge Date:                  Expected Discharge Plan:  Home w Hospice Care  In-House Referral:     Discharge planning Services  CM Consult  Post Acute Care Choice:   ([facility]) Choice offered to:     DME Arranged:    DME Agency:  Hospice and Com[facility]­o:    [place] Agency:     Status of Service:  Completed, signed off  Medicare Important Message Given:    Date Medicare IM Given:    Medicare IM give by:    Date Additional Medicare IM Given:    Additional Medicare Important Message give by:     If discussed at [place] of Stay Meetings, dates discussed:    Additional Comments:  Dessa Phi, RN 06/08/2015, 9:16 AM

## 2015-06-08 NOTE — Plan of Care (Signed)
Problem: Tissue Perfusion: Goal: Risk factors for ineffective tissue perfusion will decrease Outcome: Adequate for Discharge Pt being d/c'd with Hospice care, DNR

## 2015-06-09 LAB — URINE CULTURE: Culture: >=100000

## 2015-06-11 LAB — CULTURE, BLOOD (ROUTINE X 2)
Culture: NO GROWTH
Culture: NO GROWTH

## 2015-06-13 ENCOUNTER — Telehealth: Payer: Self-pay | Admitting: Neurology

## 2015-06-13 NOTE — Telephone Encounter (Signed)
Shirlean Schlein called to let Dr Jannifer Franklin know the patient passed this morning at 1am.

## 2015-06-13 NOTE — Telephone Encounter (Signed)
Events noted

## 2015-06-15 ENCOUNTER — Telehealth (HOSPITAL_COMMUNITY): Payer: Self-pay | Admitting: Psychiatry

## 2015-06-15 NOTE — Telephone Encounter (Signed)
Call received that patient passed away due to medical sickness.

## 2015-07-11 DEATH — deceased

## 2015-08-04 ENCOUNTER — Ambulatory Visit: Payer: Medicare Other | Admitting: Neurology

## 2015-12-06 IMAGING — CR DG CHEST 2V
2 series · 2 of 2 positions shown · non-contrast
Comparison: [DATE] and 04/11/2014

CLINICAL DATA: Followup pneumonia.  Subsequent encounter.

EXAM:
CHEST  2 VIEW

[w chest pa]
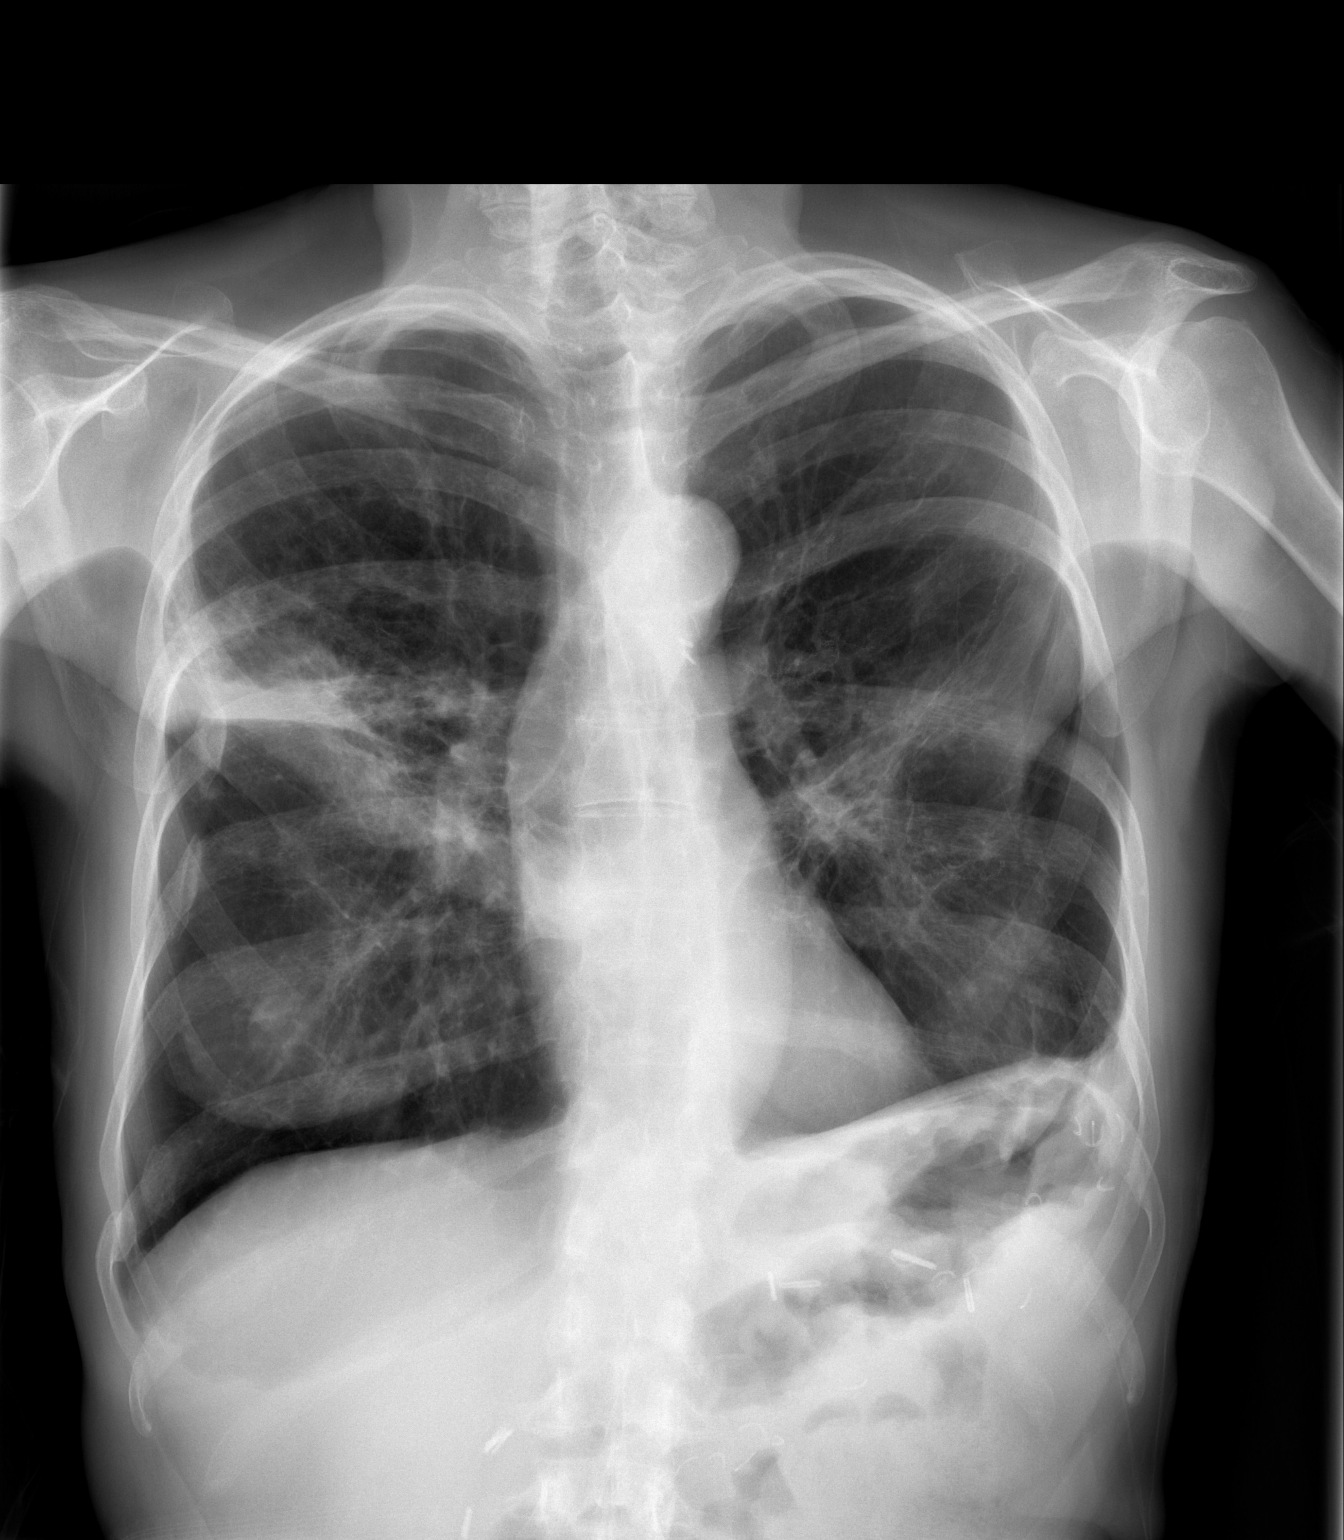

[w chest lat]
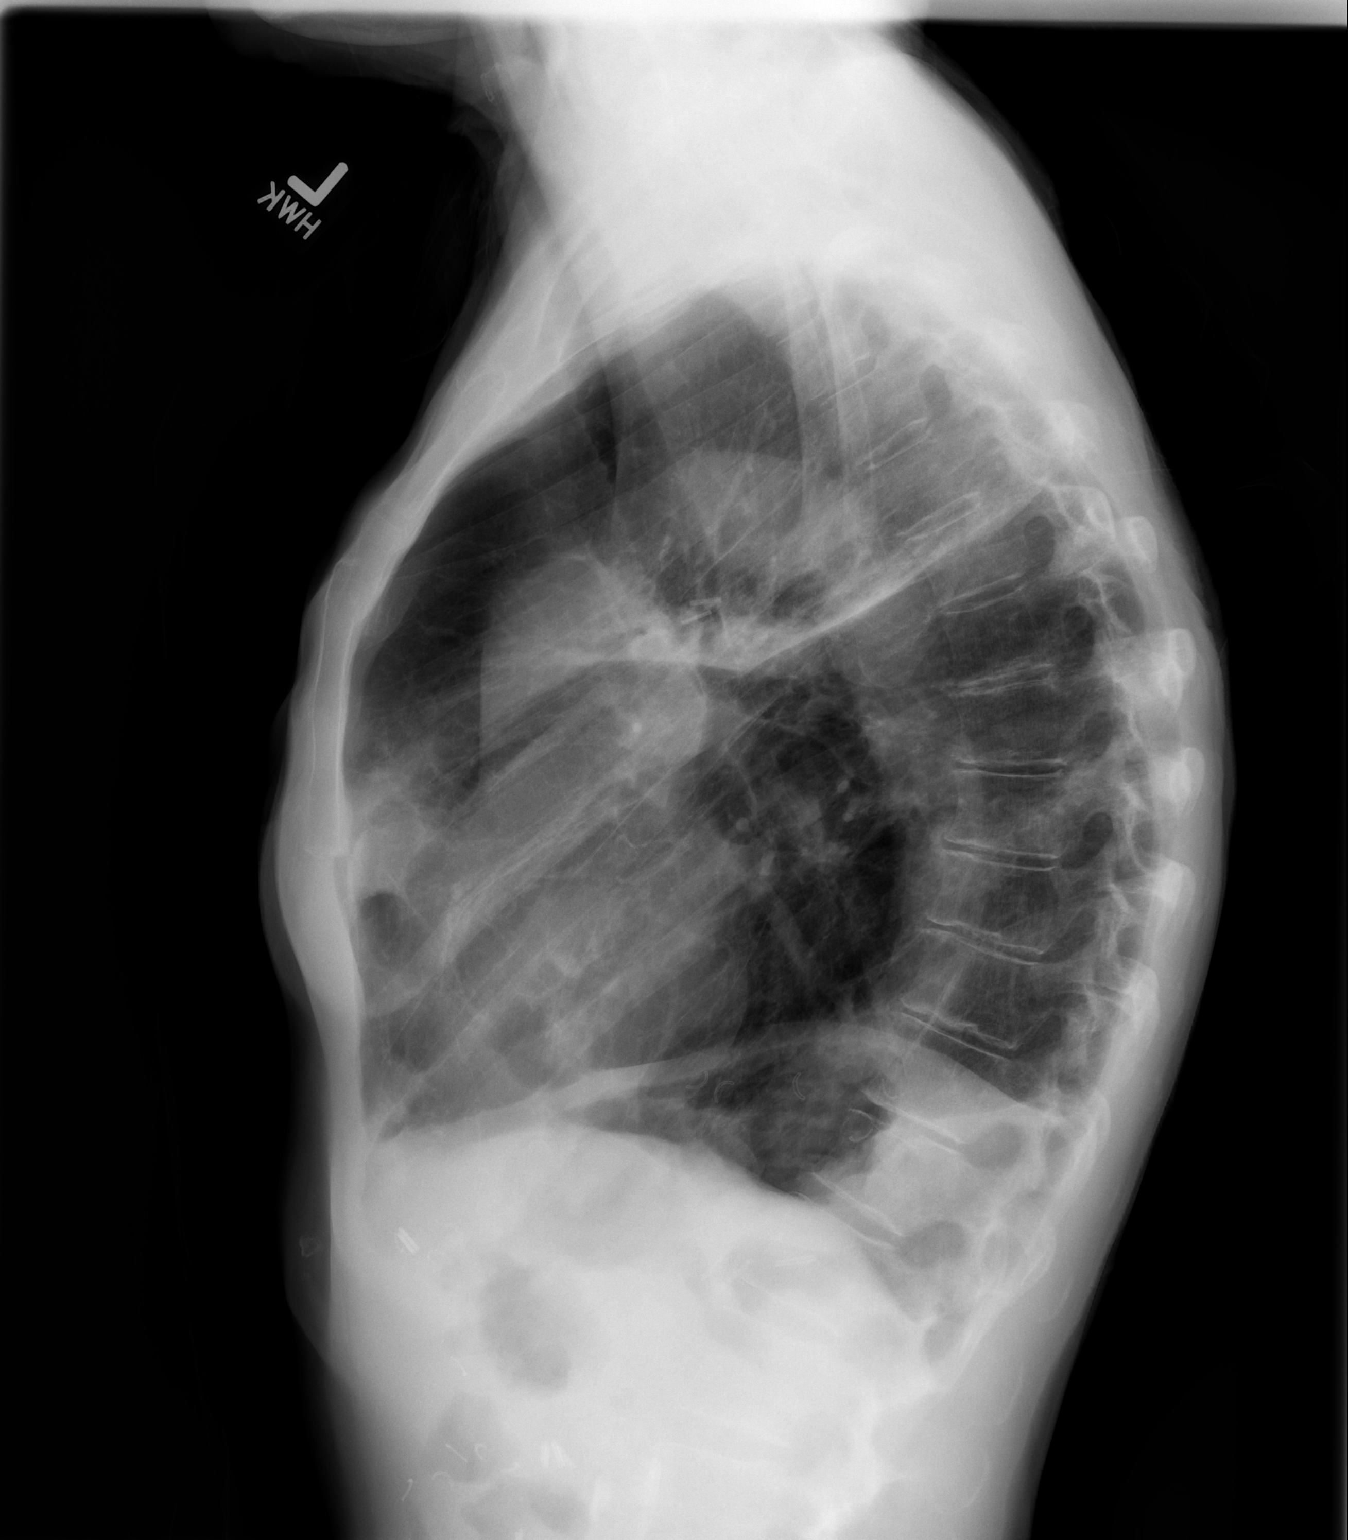

[2 of 2 positions shown; findings below may reference images not displayed]

FINDINGS: The cardiac silhouette, mediastinal and hilar contours are within
normal limits and stable. There is tortuosity and calcification of
the thoracic aorta. The right lung infiltrate is much improved.
There is some residual post pneumonic scarring or atelectasis.
Stable scarring changes in the left lung and stable severe
underlying emphysematous change.
IMPRESSION: Significant interval improvement of the right lung infiltrate with
some residual post pneumonic scarring or atelectasis.
